# Patient Record
Sex: Male | Born: 1980 | Race: Black or African American | Hispanic: No | Marital: Married | Smoking: Never smoker
Health system: Southern US, Community
[De-identification: ages and names within clinical notes are randomized; demographics above are authoritative.]

## PROBLEM LIST (undated history)

## (undated) DIAGNOSIS — I10 Essential (primary) hypertension: Secondary | ICD-10-CM

## (undated) DIAGNOSIS — G441 Vascular headache, not elsewhere classified: Secondary | ICD-10-CM

## (undated) DIAGNOSIS — R7989 Other specified abnormal findings of blood chemistry: Secondary | ICD-10-CM

## (undated) DIAGNOSIS — I952 Hypotension due to drugs: Secondary | ICD-10-CM

## (undated) DIAGNOSIS — D649 Anemia, unspecified: Secondary | ICD-10-CM

## (undated) DIAGNOSIS — D72829 Elevated white blood cell count, unspecified: Secondary | ICD-10-CM

## (undated) DIAGNOSIS — G479 Sleep disorder, unspecified: Secondary | ICD-10-CM

## (undated) DIAGNOSIS — J9600 Acute respiratory failure, unspecified whether with hypoxia or hypercapnia: Secondary | ICD-10-CM

## (undated) DIAGNOSIS — R7401 Elevation of levels of liver transaminase levels: Secondary | ICD-10-CM

## (undated) DIAGNOSIS — D62 Acute posthemorrhagic anemia: Secondary | ICD-10-CM

## (undated) DIAGNOSIS — F1210 Cannabis abuse, uncomplicated: Secondary | ICD-10-CM

## (undated) DIAGNOSIS — E871 Hypo-osmolality and hyponatremia: Secondary | ICD-10-CM

## (undated) DIAGNOSIS — I69391 Dysphagia following cerebral infarction: Secondary | ICD-10-CM

## (undated) HISTORY — DX: Elevation of levels of liver transaminase levels: R74.01

## (undated) HISTORY — DX: Elevated white blood cell count, unspecified: D72.829

## (undated) HISTORY — DX: Cannabis abuse, uncomplicated: F12.10

## (undated) HISTORY — DX: Acute posthemorrhagic anemia: D62

## (undated) HISTORY — DX: Hypotension due to drugs: I95.2

## (undated) HISTORY — DX: Sleep disorder, unspecified: G47.9

## (undated) HISTORY — DX: Acute respiratory failure, unspecified whether with hypoxia or hypercapnia: J96.00

## (undated) HISTORY — DX: Essential (primary) hypertension: I10

## (undated) HISTORY — DX: Hypo-osmolality and hyponatremia: E87.1

## (undated) HISTORY — DX: Vascular headache, not elsewhere classified: G44.1

## (undated) HISTORY — DX: Dysphagia following cerebral infarction: I69.391

## (undated) HISTORY — DX: Other specified abnormal findings of blood chemistry: R79.89

## (undated) HISTORY — PX: WISDOM TOOTH EXTRACTION: SHX21

## (undated) HISTORY — DX: Anemia, unspecified: D64.9

---
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
## 2010-09-29 ENCOUNTER — Emergency Department (HOSPITAL_COMMUNITY): Payer: Self-pay

## 2010-09-29 ENCOUNTER — Emergency Department (HOSPITAL_COMMUNITY)
Admission: EM | Admit: 2010-09-29 | Discharge: 2010-09-29 | Disposition: A | Discharge status: Home / Self Care | Payer: Self-pay | Attending: Emergency Medicine | Admitting: Emergency Medicine

## 2010-09-29 DIAGNOSIS — F1010 Alcohol abuse, uncomplicated: Secondary | ICD-10-CM | POA: Insufficient documentation

## 2010-09-29 DIAGNOSIS — R000 Tachycardia, unspecified: Secondary | ICD-10-CM | POA: Insufficient documentation

## 2010-09-29 DIAGNOSIS — Y92009 Unspecified place in unspecified non-institutional (private) residence as the place of occurrence of the external cause: Secondary | ICD-10-CM | POA: Insufficient documentation

## 2010-09-29 DIAGNOSIS — M25519 Pain in unspecified shoulder: Secondary | ICD-10-CM | POA: Insufficient documentation

## 2010-09-29 DIAGNOSIS — S43016A Anterior dislocation of unspecified humerus, initial encounter: Secondary | ICD-10-CM | POA: Insufficient documentation

## 2017-07-24 ENCOUNTER — Ambulatory Visit: Payer: No Typology Code available for payment source | Admitting: Podiatry

## 2017-07-24 ENCOUNTER — Other Ambulatory Visit: Payer: Self-pay | Admitting: Podiatry

## 2017-07-24 ENCOUNTER — Ambulatory Visit (INDEPENDENT_AMBULATORY_CARE_PROVIDER_SITE_OTHER): Payer: No Typology Code available for payment source

## 2017-07-24 ENCOUNTER — Encounter: Payer: Self-pay | Admitting: Podiatry

## 2017-07-24 DIAGNOSIS — M25571 Pain in right ankle and joints of right foot: Secondary | ICD-10-CM

## 2017-07-24 DIAGNOSIS — M779 Enthesopathy, unspecified: Secondary | ICD-10-CM | POA: Diagnosis not present

## 2017-07-24 NOTE — Progress Notes (Signed)
Subjective:   Patient ID: Dennis Mercado, male   DOB: 37 y.o.   MRN: [id number]   HPI Patient presents with pain in the outside of the right ankle times 1 month and states that it has made it difficult for him to be active if he is real active but he is having minimal pain and also has bunion deformity with family history of this.  Patient does not smoke likes to be active   Review of Systems  All other systems reviewed and are negative.       Objective:  Physical Exam  Constitutional: He appears well-developed and well-nourished.  Cardiovascular: Intact distal pulses.  Pulmonary/Chest: Effort normal.  Musculoskeletal: Normal range of motion.  Neurological: He is alert.  Skin: Skin is warm.  Nursing note and vitals reviewed.   Neurovascular status found to be intact muscle strength is within normal limits with patient found to have discomfort in the lateral side of the right ankle that is localized and minimal in its intensity but he states he is concerned about swelling with structural bunion deformity bilateral.  Patient has good digital perfusion well oriented x3     Assessment:  Possibility for mild peroneal tendinitis right with swelling with no indication of tendon dysfunction and structural bunion deformity bilateral     Plan:  H&P x-rays reviewed and at this time I have recommended ice therapy anti-inflammatories and support and if symptoms were to persist let us know and I do not recommend structural bunion correction at the current time  X-ray indicates that there is structural bunion deformity right over left

## 2017-07-24 NOTE — Progress Notes (Signed)
   Subjective:    Patient ID: Dennis Mercado, male    DOB: [birth year], 37 y.o.   MRN: [id number]  HPI    Review of Systems  All other systems reviewed and are negative.      Objective:   Physical Exam        Assessment & Plan:

## 2018-07-09 ENCOUNTER — Telehealth: Payer: Self-pay | Admitting: Podiatry

## 2018-07-09 NOTE — Telephone Encounter (Signed)
I would like to get a disc of my x-rays. You can call me back at [phone] or my e-mail address is [email or web address]

## 2018-07-13 DIAGNOSIS — M79676 Pain in unspecified toe(s): Secondary | ICD-10-CM

## 2018-07-28 DIAGNOSIS — D492 Neoplasm of unspecified behavior of bone, soft tissue, and skin: Secondary | ICD-10-CM | POA: Diagnosis not present

## 2018-07-28 DIAGNOSIS — M19071 Primary osteoarthritis, right ankle and foot: Secondary | ICD-10-CM | POA: Diagnosis not present

## 2018-07-29 ENCOUNTER — Encounter: Payer: Self-pay | Admitting: Podiatry

## 2018-07-29 ENCOUNTER — Ambulatory Visit (INDEPENDENT_AMBULATORY_CARE_PROVIDER_SITE_OTHER): Payer: BLUE CROSS/BLUE SHIELD | Admitting: Podiatry

## 2018-07-29 ENCOUNTER — Ambulatory Visit: Payer: BLUE CROSS/BLUE SHIELD

## 2018-07-29 DIAGNOSIS — M25571 Pain in right ankle and joints of right foot: Secondary | ICD-10-CM

## 2018-07-29 DIAGNOSIS — D492 Neoplasm of unspecified behavior of bone, soft tissue, and skin: Secondary | ICD-10-CM

## 2018-07-29 NOTE — Progress Notes (Signed)
   HPI: 38 year old otherwise healthy male presents the office today for evaluation of a large mass to the lateral aspect of the right ankle.  Patient was seen by Dr. Gershon Mussel, podiatry, on 07/28/2018 and he was referred here for possible surgical consult regarding excision of the soft tissue mass.  Patient states that over the past few months the mass is grown significantly in size.  Patient states is currently nonsymptomatic however the size is increased significantly was concerning for acute changes.  History reviewed. No pertinent past medical history.   Physical Exam: General: The patient is alert and oriented x3 in no acute distress.  Dermatology: Skin is warm, dry and supple bilateral lower extremities. Negative for open lesions or macerations.  Vascular: Palpable pedal pulses bilaterally. No edema or erythema noted. Capillary refill within normal limits.  Neurological: Epicritic and protective threshold grossly intact bilaterally.   Musculoskeletal Exam: Range of motion within normal limits to all pedal and ankle joints bilateral. Muscle strength 5/5 in all groups bilateral.  There is a large fluctuant mass noted to the lateral aspect of the right ankle overlying the peroneal tendons for the most part.  Mass is large approximately 4 cm in diameter and protruding.  Appears non-adhered and very fluctuant.  Radiographic Exam taken at prior office visit:  Normal osseous mineralization. Joint spaces preserved. No fracture/dislocation/boney destruction.    Assessment: 1.  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 Patient evaluated. X-Rays reviewed that were taken at Dr. Lindley Magnus office on 07/28/2018.  2.  Today explained the patient that due to the acute changes in uncertain behavior of the soft tissue tumor surgical excision would be recommended to send to pathology.  Patient agrees.  All possible complications details the procedure were explained.  No guarantees were  expressed or implied. 3.  Authorization for surgery initiated today.  Surgery will consist of excision of soft tissue tumor right ankle. 4.  Return to clinic 1 week postop      Edrick Kins, DPM [facility]  Dr. Edrick Kins, DPM    [address], [place] [address]                Office [phone]  Fax [phone]

## 2018-07-29 NOTE — Patient Instructions (Signed)
Pre-Operative Instructions  Congratulations, you have decided to take an important step towards improving your quality of life.  You can be assured that the doctors and staff at [facility] will be with you every step of the way.  Here are some important things you should know:  1. Plan to be at the surgery center/hospital at least 1 (one) hour prior to your scheduled time, unless otherwise directed by the surgical center/hospital staff.  You must have a responsible adult accompany you, remain during the surgery and drive you home.  Make sure you have directions to the surgical center/hospital to ensure you arrive on time. 2. If you are having surgery at [facility] or [facility], you will need a copy of your medical history and physical form from your family physician within one month prior to the date of surgery. We will give you a form for your primary physician to complete.  3. We make every effort to accommodate the date you request for surgery.  However, there are times where surgery dates or times have to be moved.  We will contact you as soon as possible if a change in schedule is required.   4. No aspirin/ibuprofen for one week before surgery.  If you are on aspirin, any non-steroidal anti-inflammatory medications (Mobic, Aleve, Ibuprofen) should not be taken seven (7) days prior to your surgery.  You make take Tylenol for pain prior to surgery.  5. Medications - If you are taking daily heart and blood pressure medications, seizure, reflux, allergy, asthma, anxiety, pain or diabetes medications, make sure you notify the surgery center/hospital before the day of surgery so they can tell you which medications you should take or avoid the day of surgery. 6. No food or drink after midnight the night before surgery unless directed otherwise by surgical center/hospital staff. 7. No alcoholic beverages 24-hours prior to surgery.  No smoking 24-hours prior or 24-hours after  surgery. 8. Wear loose pants or shorts. They should be loose enough to fit over bandages, boots, and casts. 9. Don't wear slip-on shoes. Sneakers are preferred. 10. Bring your boot with you to the surgery center/hospital.  Also bring crutches or a walker if your physician has prescribed it for you.  If you do not have this equipment, it will be provided for you after surgery. 11. If you have not been contacted by the surgery center/hospital by the day before your surgery, call to confirm the date and time of your surgery. 12. Leave-time from work may vary depending on the type of surgery you have.  Appropriate arrangements should be made prior to surgery with your employer. 13. Prescriptions will be provided immediately following surgery by your doctor.  Fill these as soon as possible after surgery and take the medication as directed. Pain medications will not be refilled on weekends and must be approved by the doctor. 14. Remove nail polish on the operative foot and avoid getting pedicures prior to surgery. 15. Wash the night before surgery.  The night before surgery wash the foot and leg well with water and the antibacterial soap provided. Be sure to pay special attention to beneath the toenails and in between the toes.  Wash for at least three (3) minutes. Rinse thoroughly with water and dry well with a towel.  Perform this wash unless told not to do so by your physician.  Enclosed: 1 Ice pack (please put in freezer the night before surgery)   1 Hibiclens skin cleaner     Pre-op instructions  If you have any questions regarding the instructions, please do not hesitate to call our office.  [place]: [address], [place], [place] [address] -- [phone]  [place]: [address] [place], [place] [address] -- [phone]  [place]: [address]  [place], [place] [address] -- [phone]  [place]: [address], [address], [place],  [address] -- [phone]  Website:  [email or web address] 

## 2018-08-21 ENCOUNTER — Telehealth: Payer: Self-pay | Admitting: *Deleted

## 2018-08-21 NOTE — Telephone Encounter (Signed)
"  I am calling to verify my appointment for March 26.  Please give me a call back."  I'm returning your call.  How can I help you?  "I'm not seeing my surgery date and time in MyChart. I was told I needed to register for my surgery."  We do have you scheduled for March 26.  You are supposed to register via [facility].  The surgical center is not affiliated with [facility].  They do not have access to MyChart.  The instructions on how to register is in the brochure that we gave you.  "Oh okay, thank you."

## 2018-08-28 ENCOUNTER — Telehealth: Payer: Self-pay | Admitting: *Deleted

## 2018-08-28 NOTE — Telephone Encounter (Signed)
"  I'm scheduled for surgery on March 26.  I just need to know the time and the location."  The brochure about the surgery center is in the bag that we gave you.  "Yeah I know, I'm just not home.  I'm at work and I want to put it on my calendar."  It's [address]  Someone from the surgical center will call you a day or two prior to your surgical date and they will give you your arrival time.  "Thank you so much."

## 2018-08-31 ENCOUNTER — Encounter: Payer: Self-pay | Admitting: Family Medicine

## 2018-08-31 ENCOUNTER — Other Ambulatory Visit: Payer: Self-pay

## 2018-08-31 ENCOUNTER — Ambulatory Visit: Payer: BLUE CROSS/BLUE SHIELD | Admitting: Family Medicine

## 2018-08-31 VITALS — BP 130/80 | HR 100 | Temp 98.9°F | Ht 67.0 in | Wt 168.5 lb

## 2018-08-31 DIAGNOSIS — Z0000 Encounter for general adult medical examination without abnormal findings: Secondary | ICD-10-CM

## 2018-08-31 NOTE — Patient Instructions (Signed)
Preventive Care 18-39 Years, Male Preventive care refers to lifestyle choices and visits with your health care provider that can promote health and wellness. What does preventive care include?   A yearly physical exam. This is also called an annual well check.  Dental exams once or twice a year.  Routine eye exams. Ask your health care provider how often you should have your eyes checked.  Personal lifestyle choices, including: ? Daily care of your teeth and gums. ? Regular physical activity. ? Eating a healthy diet. ? Avoiding tobacco and drug use. ? Limiting alcohol use. ? Practicing safe sex. What happens during an annual well check? The services and screenings done by your health care provider during your annual well check will depend on your age, overall health, lifestyle risk factors, and family history of disease. Counseling Your health care provider may ask you questions about your:  Alcohol use.  Tobacco use.  Drug use.  Emotional well-being.  Home and relationship well-being.  Sexual activity.  Eating habits.  Work and work Statistician. Screening You may have the following tests or measurements:  Height, weight, and BMI.  Blood pressure.  Lipid and cholesterol levels. These may be checked every 5 years starting at age 61.  Diabetes screening. This is done by checking your blood sugar (glucose) after you have not eaten for a while (fasting).  Skin check.  Hepatitis C blood test.  Hepatitis B blood test.  Sexually transmitted disease (STD) testing. Discuss your test results, treatment options, and if necessary, the need for more tests with your health care provider. Vaccines Your health care provider may recommend certain vaccines, such as:  Influenza vaccine. This is recommended every year.  Tetanus, diphtheria, and acellular pertussis (Tdap, Td) vaccine. You may need a Td booster every 10 years.  Varicella vaccine. You may need this if you  have not been vaccinated.  HPV vaccine. If you are 71 or younger, you may need three doses over 6 months.  Measles, mumps, and rubella (MMR) vaccine. You may need at least one dose of MMR.You may also need a second dose.  Pneumococcal 13-valent conjugate (PCV13) vaccine. You may need this if you have certain conditions and have not been vaccinated.  Pneumococcal polysaccharide (PPSV23) vaccine. You may need one or two doses if you smoke cigarettes or if you have certain conditions.  Meningococcal vaccine. One dose is recommended if you are age 33-21 years and a first-year college student living in a residence hall, or if you have one of several medical conditions. You may also need additional booster doses.  Hepatitis A vaccine. You may need this if you have certain conditions or if you travel or work in places where you may be exposed to hepatitis A.  Hepatitis B vaccine. You may need this if you have certain conditions or if you travel or work in places where you may be exposed to hepatitis B.  Haemophilus influenzae type b (Hib) vaccine. You may need this if you have certain risk factors. Talk to your health care provider about which screenings and vaccines you need and how often you need them. This information is not intended to replace advice given to you by your health care provider. Make sure you discuss any questions you have with your health care provider. Document Released: 07/30/2001 Document Revised: 01/14/2017 Document Reviewed: 04/04/2015 Elsevier Interactive Patient Education  2019 [place] Maintenance, Male A healthy lifestyle and preventive care is important for your health and wellness.  Ask your health care provider about what schedule of regular examinations is right for you. What should I know about weight and diet? Eat a Healthy Diet  Eat plenty of vegetables, fruits, whole grains, low-fat dairy products, and lean protein.  Do not eat a lot of foods  high in solid fats, added sugars, or salt.  Maintain a Healthy Weight Regular exercise can help you achieve or maintain a healthy weight. You should:  Do at least 150 minutes of exercise each week. The exercise should increase your heart rate and make you sweat (moderate-intensity exercise).  Do strength-training exercises at least twice a week. Watch Your Levels of Cholesterol and Blood Lipids  Have your blood tested for lipids and cholesterol every 5 years starting at 38 years of age. If you are at high risk for heart disease, you should start having your blood tested when you are 38 years old. You may need to have your cholesterol levels checked more often if: ? Your lipid or cholesterol levels are high. ? You are older than 38 years of age. ? You are at high risk for heart disease. What should I know about cancer screening? Many types of cancers can be detected early and may often be prevented. Lung Cancer  You should be screened every year for lung cancer if: ? You are a current smoker who has smoked for at least 30 years. ? You are a former smoker who has quit within the past 15 years.  Talk to your health care provider about your screening options, when you should start screening, and how often you should be screened. Colorectal Cancer  Routine colorectal cancer screening usually begins at 38 years of age and should be repeated every 5-10 years until you are 38 years old. You may need to be screened more often if early forms of precancerous polyps or small growths are found. Your health care provider may recommend screening at an earlier age if you have risk factors for colon cancer.  Your health care provider may recommend using home test kits to check for hidden blood in the stool.  A small camera at the end of a tube can be used to examine your colon (sigmoidoscopy or colonoscopy). This checks for the earliest forms of colorectal cancer. Prostate and Testicular Cancer   Depending on your age and overall health, your health care provider may do certain tests to screen for prostate and testicular cancer.  Talk to your health care provider about any symptoms or concerns you have about testicular or prostate cancer. Skin Cancer  Check your skin from head to toe regularly.  Tell your health care provider about any new moles or changes in moles, especially if: ? There is a change in a mole's size, shape, or color. ? You have a mole that is larger than a pencil eraser.  Always use sunscreen. Apply sunscreen liberally and repeat throughout the day.  Protect yourself by wearing long sleeves, pants, a wide-brimmed hat, and sunglasses when outside. What should I know about heart disease, diabetes, and high blood pressure?  If you are 50-7 years of age, have your blood pressure checked every 3-5 years. If you are 19 years of age or older, have your blood pressure checked every year. You should have your blood pressure measured twice-once when you are at a hospital or clinic, and once when you are not at a hospital or clinic. Record the average of the two measurements. To check your blood pressure when  you are not at a hospital or clinic, you can use: ? An automated blood pressure machine at a pharmacy. ? A home blood pressure monitor.  Talk to your health care provider about your target blood pressure.  If you are between 22-25 years old, ask your health care provider if you should take aspirin to prevent heart disease.  Have regular diabetes screenings by checking your fasting blood sugar level. ? If you are at a normal weight and have a low risk for diabetes, have this test once every three years after the age of 68. ? If you are overweight and have a high risk for diabetes, consider being tested at a younger age or more often.  A one-time screening for abdominal aortic aneurysm (AAA) by ultrasound is recommended for men aged 79-75 years who are current or former  smokers. What should I know about preventing infection? Hepatitis B If you have a higher risk for hepatitis B, you should be screened for this virus. Talk with your health care provider to find out if you are at risk for hepatitis B infection. Hepatitis C Blood testing is recommended for:  Everyone born from [birth year] through 1965.  Anyone with known risk factors for hepatitis C. Sexually Transmitted Diseases (STDs)  You should be screened each year for STDs including gonorrhea and chlamydia if: ? You are sexually active and are younger than 38 years of age. ? You are older than 38 years of age and your health care provider tells you that you are at risk for this type of infection. ? Your sexual activity has changed since you were last screened and you are at an increased risk for chlamydia or gonorrhea. Ask your health care provider if you are at risk.  Talk with your health care provider about whether you are at high risk of being infected with HIV. Your health care provider may recommend a prescription medicine to help prevent HIV infection. What else can I do?  Schedule regular health, dental, and eye exams.  Stay current with your vaccines (immunizations).  Do not use any tobacco products, such as cigarettes, chewing tobacco, and e-cigarettes. If you need help quitting, ask your health care provider.  Limit alcohol intake to no more than 2 drinks per day. One drink equals 12 ounces of beer, 5 ounces of wine, or 1 ounces of hard liquor.  Do not use street drugs.  Do not share needles.  Ask your health care provider for help if you need support or information about quitting drugs.  Tell your health care provider if you often feel depressed.  Tell your health care provider if you have ever been abused or do not feel safe at home. This information is not intended to replace advice given to you by your health care provider. Make sure you discuss any questions you have with your  health care provider. Document Released: 11/30/2007 Document Revised: 01/31/2016 Document Reviewed: 03/07/2015 Elsevier Interactive Patient Education  2019 [employer].  Alcohol Use Disorder Alcohol use disorder is when your drinking disrupts your daily life. When you have this condition, you drink too much alcohol and you cannot control your drinking. Alcohol use disorder can cause serious problems with your physical health. It can affect your brain, heart, liver, pancreas, immune system, stomach, and intestines. Alcohol use disorder can increase your risk for certain cancers and cause problems with your mental health, such as depression, anxiety, psychosis, delirium, and dementia. People with this disorder risk hurting themselves and  others. What are the causes? This condition is caused by drinking too much alcohol over time. It is not caused by drinking too much alcohol only one or two times. Some people with this condition drink alcohol to cope with or escape from negative life events. Others drink to relieve pain or symptoms of mental illness. What increases the risk? You are more likely to develop this condition if:  You have a family history of alcohol use disorder.  Your culture encourages drinking to the point of intoxication, or makes alcohol easy to get.  You had a mood or conduct disorder in childhood.  You have been a victim of abuse.  You are an adolescent and: ? You have poor grades or difficulties in school. ? Your caregivers do not talk to you about saying no to alcohol, or supervise your activities. ? You are impulsive or you have trouble with self-control. What are the signs or symptoms? Symptoms of this condition include:  Drinkingmore than you want to.  Drinking for longer than you want to.  Trying several times to drink less or to control your drinking.  Spending a lot of time getting alcohol, drinking, or recovering from drinking.  Craving alcohol.   Having problems at work, at school, or at home due to drinking.  Having problems in relationships due to drinking.  Drinking when it is dangerous to drink, such as before driving a car.  Continuing to drink even though you know you might have a physical or mental problem related to drinking.  Needing more and more alcohol to get the same effect you want from the alcohol (building up tolerance).  Having symptoms of withdrawal when you stop drinking. Symptoms of withdrawal include: ? Fatigue. ? Nightmares. ? Trouble sleeping. ? Depression. ? Anxiety. ? Fever. ? Seizures. ? Severe confusion. ? Feeling or seeing things that are not there (hallucinations). ? Tremors. ? Rapid heart rate. ? Rapid breathing. ? High blood pressure.  Drinking to avoid symptoms of withdrawal. How is this diagnosed? This condition is diagnosed with an assessment. Your health care provider may start the assessment by asking three or four questions about your drinking. Your health care provider may perform a physical exam or do lab tests to see if you have physical problems resulting from alcohol use. She or he may refer you to a mental health professional for evaluation. How is this treated? Some people with alcohol use disorder are able to reduce their alcohol use to low-risk levels. Others need to completely quit drinking alcohol. When necessary, mental health professionals with specialized training in substance use treatment can help. Your health care provider can help you decide how severe your alcohol use disorder is and what type of treatment you need. The following forms of treatment are available:  Detoxification. Detoxification involves quitting drinking and using prescription medicines within the first week to help lessen withdrawal symptoms. This treatment is important for people who have had withdrawal symptoms before and for heavy drinkers who are likely to have withdrawal symptoms. Alcohol withdrawal  can be dangerous, and in severe cases, it can cause death. Detoxification may be provided in a home, community, or primary care setting, or in a hospital or substance use treatment facility.  Counseling. This treatment is also called talk therapy. It is provided by substance use treatment counselors. A counselor can address the reasons you use alcohol and suggest ways to keep you from drinking again or to prevent problem drinking. The goals of talk therapy  are to: ? Find healthy activities and ways for you to cope with stress. ? Identify and avoid the things that trigger your alcohol use. ? Help you learn how to handle cravings.  Medicines.Medicines can help treat alcohol use disorder by: ? Decreasing alcohol cravings. ? Decreasing the positive feeling you have when you drink alcohol. ? Causing an uncomfortable physical reaction when you drink alcohol (aversion therapy).  Support groups. Support groups are led by people who have quit drinking. They provide emotional support, advice, and guidance. These forms of treatment are often combined. Some people with this condition benefit from a combination of treatments provided by specialized substance use treatment centers. Follow these instructions at home:  Take over-the-counter and prescription medicines only as told by your health care provider.  Check with your health care provider before starting any new medicines.  Ask friends and family members not to offer you alcohol.  Avoid situations where alcohol is served, including gatherings where others are drinking alcohol.  Create a plan for what to do when you are tempted to use alcohol.  Find hobbies or activities that you enjoy that do not include alcohol.  Keep all follow-up visits as told by your health care provider. This is important. How is this prevented?  If you drink, limit alcohol intake to no more than 1 drink a day for nonpregnant women and 2 drinks a day for men. One drink  equals 12 oz of beer, 5 oz of wine, or 1 oz of hard liquor.  If you have a mental health condition, get treatment and support.  Do not give alcohol to adolescents.  If you are an adolescent: ? Do not drink alcohol. ? Do not be afraid to say no if someone offers you alcohol. Speak up about why you do not want to drink. You can be a positive role model for your friends and set a good example for those around you by not drinking alcohol. ? If your friends drink, spend time with others who do not drink alcohol. Make new friends who do not use alcohol. ? Find healthy ways to manage stress and emotions, such as meditation or deep breathing, exercise, spending time in nature, listening to music, or talking with a trusted friend or family member. Contact a health care provider if:  You are not able to take your medicines as told.  Your symptoms get worse.  You return to drinking alcohol (relapse) and your symptoms get worse. Get help right away if:  You have thoughts about hurting yourself or others. If you ever feel like you may hurt yourself or others, or have thoughts about taking your own life, get help right away. You can go to your nearest emergency department or call:  Your local emergency services (911 in the [place]  A suicide crisis helpline, such as the [place] at (502)834-0495. This is open 24 hours a day. Summary  Alcohol use disorder is when your drinking disrupts your daily life. When you have this condition, you drink too much alcohol and you cannot control your drinking.  Treatment may include detoxification, counseling, medicine, and support groups.  Ask friends and family members not to offer you alcohol. Avoid situations where alcohol is served.  Get help right away if you have thoughts about hurting yourself or others. This information is not intended to replace advice given to you by your health care provider. Make sure you discuss any  questions you have with your health care  provider. Document Released: 07/11/2004 Document Revised: 02/29/2016 Document Reviewed: 02/29/2016 Elsevier Interactive Patient Education  [employer].

## 2018-08-31 NOTE — Progress Notes (Signed)
Established Patient Office Visit  Subjective:  Patient ID: Dennis Mercado, male    DOB: 07-Dec-1980  Age: 38 y.o. MRN: [id number]  CC:  Chief Complaint  Patient presents with  . Establish Care    HPI Dennis Mercado presents for establishment of care and a physical exam.  Patient is in good health as far as he knows.  His blood pressure was elevated at the dentist when he was about to have a tooth pulled.  He has no known history of blood pressure issue.  Denies chronic headaches or changes in his vision.  Mom is in early 41s and is overweight.  He thinks that she may have high blood pressure.  Father's health history is unknown.  Patient works in the [employer], children[employer].  He is engaged to be married in May.  He is currently living with his fiance.  Patient does not smoke or use illicit drugs.  He does not drink alcohol through the week but consumes up to 1/5 of alcohol over the weekend with his fiance.  He is has been concerned about her drinking.  He does get 20 minutes of exercise walking his dog daily. History reviewed. No pertinent past medical history.  History reviewed. No pertinent surgical history.  History reviewed. No pertinent family history.  Social History   Socioeconomic History  . Marital status: Single    Spouse name: Not on file  . Number of children: Not on file  . Years of education: Not on file  . Highest education level: Not on file  Occupational History  . Not on file  Social Needs  . Financial resource strain: Not on file  . Food insecurity:    Worry: Not on file    Inability: Not on file  . Transportation needs:    Medical: Not on file    Non-medical: Not on file  Tobacco Use  . Smoking status: Never Smoker  . Smokeless tobacco: Never Used  Substance and Sexual Activity  . Alcohol use: Yes    Comment: several drinks over the weekend  . Drug use: Never  . Sexual activity: Not on file  Lifestyle  . Physical activity:    Days per  week: Not on file    Minutes per session: Not on file  . Stress: Not on file  Relationships  . Social connections:    Talks on phone: Not on file    Gets together: Not on file    Attends religious service: Not on file    Active member of club or organization: Not on file    Attends meetings of clubs or organizations: Not on file    Relationship status: Not on file  . Intimate partner violence:    Fear of current or ex partner: Not on file    Emotionally abused: Not on file    Physically abused: Not on file    Forced sexual activity: Not on file  Other Topics Concern  . Not on file  Social History Narrative  . Not on file    No outpatient medications prior to visit.   No facility-administered medications prior to visit.     No Known Allergies  ROS Review of Systems  Constitutional: Negative for chills, diaphoresis, fatigue, fever and unexpected weight change.  HENT: Negative.   Eyes: Negative for photophobia and visual disturbance.  Respiratory: Negative.   Cardiovascular: Negative.   Gastrointestinal: Negative.   Genitourinary: Negative.   Musculoskeletal: Negative for gait problem.  Skin: Negative for pallor and rash.  Allergic/Immunologic: Negative for immunocompromised state.  Neurological: Negative for headaches.  Hematological: Does not bruise/bleed easily.  Psychiatric/Behavioral: Negative.       Objective:    Physical Exam  Constitutional: He is oriented to person, place, and time. He appears well-developed and well-nourished. No distress.  HENT:  Head: Normocephalic and atraumatic.  Right Ear: External ear normal.  Left Ear: External ear normal.  Mouth/Throat: Oropharynx is clear and moist. No oropharyngeal exudate.  Eyes: Pupils are equal, round, and reactive to light. Conjunctivae are normal. Right eye exhibits no discharge. Left eye exhibits no discharge. No scleral icterus.  Neck: Neck supple. No JVD present. No tracheal deviation present. No  thyromegaly present.  Cardiovascular: Normal rate, regular rhythm and normal heart sounds.  Pulmonary/Chest: Effort normal and breath sounds normal. No stridor.  Abdominal: Bowel sounds are normal. He exhibits no distension. There is no abdominal tenderness. There is no rebound and no guarding. Hernia confirmed negative in the right inguinal area and confirmed negative in the left inguinal area.  Genitourinary:    Penis normal.  Right testis shows no mass, no swelling and no tenderness. Right testis is descended. Left testis shows no mass, no swelling and no tenderness. Left testis is descended. Uncircumcised. No phimosis, paraphimosis, hypospadias, penile erythema or penile tenderness. No discharge found.  Lymphadenopathy:    He has no cervical adenopathy.       Right: No inguinal adenopathy present.       Left: No inguinal adenopathy present.  Neurological: He is alert and oriented to person, place, and time.  Skin: Skin is warm and dry. He is not diaphoretic.  Psychiatric: He has a normal mood and affect. His behavior is normal.    BP 130/80   Pulse 100   Temp 98.9 F (37.2 C) (Oral)   Ht _0  (1.702 m)   Wt 168 lb 8 oz (76.4 kg)   SpO2 98%   BMI 26.39 kg/m  Wt Readings from Last 3 Encounters:  08/31/18 168 lb 8 oz (76.4 kg)   BP Readings from Last 3 Encounters:  08/31/18 130/80   Guideline developer:  UpToDate (see UpToDate for funding source) Date Released: June 2014  Health Maintenance Due  Topic Date Due  . HIV Screening  01/28/1996  . TETANUS/TDAP  01/28/2000  . INFLUENZA VACCINE  01/15/2018    There are no preventive care reminders to display for this patient.  No results found for: TSH No results found for: WBC, HGB, HCT, MCV, PLT No results found for: NA, K, CHLORIDE, CO2, GLUCOSE, BUN, CREATININE, BILITOT, ALKPHOS, AST, ALT, PROT, ALBUMIN, CALCIUM, ANIONGAP, EGFR, GFR No results found for: CHOL No results found for: HDL No results found for: LDLCALC No  results found for: TRIG No results found for: CHOLHDL No results found for: HGBA1C    Assessment & Plan:   Problem List Items Addressed This Visit    None    Visit Diagnoses    Healthcare maintenance    -  Primary   Relevant Orders   CBC   Comprehensive metabolic panel   HIV Antibody (routine testing w rflx)   Lipid panel   Urinalysis, Routine w reflex microscopic      No orders of the defined types were placed in this encounter.   Follow-up: Return in about 6 months (around 03/03/2019).   Patient was given information on health maintenance and disease prevention.  He was also given information on alcohol  use disorder.  Encouraged him to exercise at least 30 minutes 5 days a week.  Discussed the relationship between elevated blood pressure and alcohol.  We will return fasting for above ordered

## 2018-09-08 ENCOUNTER — Telehealth: Payer: Self-pay | Admitting: *Deleted

## 2018-09-08 NOTE — Telephone Encounter (Signed)
"  I was calling to confirm my appointment for Thursday.  If someone would, get with me within a day or two of surgery.  Call me back on my cell phone.  Talk to you soon."  I left the patient a message to call me back.  I informed him that we need to reschedule his surgery.  Surgeries have been canceled due to the [facility] Virus.

## 2018-09-09 NOTE — Telephone Encounter (Signed)
I'm calling to reschedule your surgery.  "Okay, when can he do it."  He can do it on April 16.  "That's fine, what time?"  Someone from the surgical center will call you a day or two prior to your surgery date.  They will give you your arrival time.

## 2018-09-16 ENCOUNTER — Other Ambulatory Visit: Payer: BLUE CROSS/BLUE SHIELD

## 2018-09-18 ENCOUNTER — Telehealth: Payer: Self-pay | Admitting: *Deleted

## 2018-09-18 NOTE — Telephone Encounter (Signed)
Unfortunately, I am calling to reschedule your surgery again.  Dr. Amalia Hailey can do it on Nov 12, 2018.  "Is this a guarantee?" I cannot guarantee that it will be done at this time.  It depends on whether or not if the [facility] Virus is still here.  "Okay, put me down."  I rescheduled his surgery from 10/01/2018 to 11/12/2018 via the surgical center's [employer] Portal.

## 2018-10-06 ENCOUNTER — Telehealth: Payer: Self-pay | Admitting: *Deleted

## 2018-10-06 NOTE — Telephone Encounter (Signed)
"  Next Thursday sounds good."  I'll get your surgery rescheduled.  "You don't know what time do you?"  I do not know.  Someone from the surgical center will give you a call a day or two prior to your surgery date.  They will give you your arrival time.  I rescheduled his surgery from 11/12/2018 to 10/15/2018 via the surgical center's [employer] Portal.

## 2018-10-06 NOTE — Telephone Encounter (Signed)
"  I am calling to let you know that the surgical center has reopened.  Dr. Amalia Hailey can do your surgery this Thursday or he can do it next Thursday.  "Sweet, can I call you back?"  I need to find a ride?"  Yes, that will be fine.  "I'll call you right back."

## 2018-10-07 ENCOUNTER — Other Ambulatory Visit: Payer: BLUE CROSS/BLUE SHIELD

## 2018-10-07 ENCOUNTER — Telehealth: Payer: Self-pay | Admitting: *Deleted

## 2018-10-07 NOTE — Telephone Encounter (Signed)
DOS 10/15/2018 CPT CODE 06004 RIGHT FOOT  PRE-CERT NOT REQUIRED  Member Number: [id number] Policy Effective : 02/33/4356 - 06/16/9998   Member Liability Summary      In-Network    Max Per Benefit Period Year-to-Date Remaining  CoInsurance  50%   Deductible  $5000.00 $8616.83  Out-Of-Pocket  $6600.00 $6131.89   AMBULATORY SURGERY  In Network  [employer] Coinsurance  Not Applicable  72% per [place]

## 2018-10-15 ENCOUNTER — Encounter: Payer: Self-pay | Admitting: *Deleted

## 2018-10-15 ENCOUNTER — Encounter: Payer: Self-pay | Admitting: Podiatry

## 2018-10-15 ENCOUNTER — Other Ambulatory Visit: Payer: Self-pay | Admitting: Podiatry

## 2018-10-15 DIAGNOSIS — I10 Essential (primary) hypertension: Secondary | ICD-10-CM | POA: Diagnosis not present

## 2018-10-15 DIAGNOSIS — M67479 Ganglion, unspecified ankle and foot: Secondary | ICD-10-CM | POA: Diagnosis not present

## 2018-10-15 DIAGNOSIS — M67471 Ganglion, right ankle and foot: Secondary | ICD-10-CM | POA: Diagnosis not present

## 2018-10-15 DIAGNOSIS — D492 Neoplasm of unspecified behavior of bone, soft tissue, and skin: Secondary | ICD-10-CM | POA: Diagnosis not present

## 2018-10-15 MED ORDER — OXYCODONE-ACETAMINOPHEN 5-325 MG PO TABS: 1.0000 | ORAL_TABLET | Freq: Four times a day (QID) | ORAL | 0 refills | Status: DC | PRN

## 2018-10-15 NOTE — Progress Notes (Signed)
.  postop

## 2018-10-15 NOTE — Telephone Encounter (Addendum)
Pt called and states he had surgery today at the surgery center with Dr. Amalia Hailey, and Dr. Amalia Hailey did not give him a note for work. I told pt I would write a note for pt to be out of work until evaluated at the 1st POV on 10/21/2018. Pt requested the letter to be emailed to [email or web address]. Emailed letter of 10/15/2018 to pt requested address.

## 2018-10-17 ENCOUNTER — Encounter: Payer: Self-pay | Admitting: Podiatry

## 2018-10-21 ENCOUNTER — Encounter: Payer: Self-pay | Admitting: Podiatry

## 2018-10-21 ENCOUNTER — Ambulatory Visit (INDEPENDENT_AMBULATORY_CARE_PROVIDER_SITE_OTHER): Payer: Self-pay | Admitting: Podiatry

## 2018-10-21 ENCOUNTER — Other Ambulatory Visit: Payer: Self-pay

## 2018-10-21 VITALS — Temp 97.7°F

## 2018-10-21 DIAGNOSIS — Z09 Encounter for follow-up examination after completed treatment for conditions other than malignant neoplasm: Secondary | ICD-10-CM

## 2018-10-21 DIAGNOSIS — D492 Neoplasm of unspecified behavior of bone, soft tissue, and skin: Secondary | ICD-10-CM

## 2018-10-21 NOTE — Progress Notes (Signed)
   Subjective:  Patient presents today status post excision of ganglion right lateral ankle. DOS: 10/15/2018.  Patient denies pain.  He has been doing very well in his cam boot.  He is kept the dressings clean dry and intact.  History reviewed. No pertinent past medical history.    Objective/Physical Exam Neurovascular status intact.  Skin incisions appear to be well coapted with staples intact. No sign of infectious process noted. No dehiscence. No active bleeding noted. Moderate edema noted to the surgical extremity.  There does appear to be some fluctuance along the incision site.  Hopefully this resolves with time.  Assessment: 1. s/p excision ganglion cyst right ankle. DOS: 10/15/2018   Plan of Care:  1. Patient was evaluated.  2.  Dressings changed today.  Keep dressings clean dry and intact x1 week 3.  Continue weightbearing in the cam boot x1 week 4.  Return to clinic in 1 week for staple removal   Edrick Kins, DPM [facility]  Dr. Edrick Kins, [address]                                        [place], [place] [address]                Office [phone]  Fax [phone]

## 2018-10-22 ENCOUNTER — Encounter: Payer: Self-pay | Admitting: Podiatry

## 2018-10-28 ENCOUNTER — Other Ambulatory Visit: Payer: Self-pay

## 2018-10-28 ENCOUNTER — Encounter: Payer: Self-pay | Admitting: Podiatry

## 2018-10-28 ENCOUNTER — Ambulatory Visit (INDEPENDENT_AMBULATORY_CARE_PROVIDER_SITE_OTHER): Payer: BLUE CROSS/BLUE SHIELD | Admitting: Podiatry

## 2018-10-28 DIAGNOSIS — Z09 Encounter for follow-up examination after completed treatment for conditions other than malignant neoplasm: Secondary | ICD-10-CM

## 2018-10-28 DIAGNOSIS — D492 Neoplasm of unspecified behavior of bone, soft tissue, and skin: Secondary | ICD-10-CM

## 2018-11-01 NOTE — Progress Notes (Signed)
   Subjective:  Patient presents today status post excision of ganglion right lateral ankle. DOS: 10/15/2018. He states he has experienced an increase in pain since his previous visit. He has been using the CAM boot as directed and denies modifying factors. Patient is here for further evaluation and treatment.   History reviewed. No pertinent past medical history.    Objective/Physical Exam Neurovascular status intact.  Skin incisions appear to be well coapted with staples intact. No sign of infectious process noted. No dehiscence. No active bleeding noted. Moderate edema noted to the surgical extremity.  There does appear to be some fluctuance along the incision site.  Hopefully this resolves with time.  Assessment: 1. s/p excision ganglion cyst right ankle. DOS: 10/15/2018   Plan of Care:  1. Patient was evaluated.  2. Staples removed.  3. Compression anklet dispensed.  4. Discontinue using CAM boot.  5. Return to clinic in 3 weeks for final check up.    Edrick Kins, DPM [facility]  Dr. Edrick Kins, [address]                                        [place], [place] [address]                Office [phone]  Fax [phone]

## 2018-11-18 ENCOUNTER — Ambulatory Visit (INDEPENDENT_AMBULATORY_CARE_PROVIDER_SITE_OTHER): Payer: BC Managed Care – PPO | Admitting: Podiatry

## 2018-11-18 ENCOUNTER — Other Ambulatory Visit: Payer: Self-pay

## 2018-11-18 ENCOUNTER — Other Ambulatory Visit: Payer: BLUE CROSS/BLUE SHIELD

## 2018-11-18 ENCOUNTER — Encounter: Payer: Self-pay | Admitting: Podiatry

## 2018-11-18 DIAGNOSIS — Z09 Encounter for follow-up examination after completed treatment for conditions other than malignant neoplasm: Secondary | ICD-10-CM

## 2018-11-18 DIAGNOSIS — D492 Neoplasm of unspecified behavior of bone, soft tissue, and skin: Secondary | ICD-10-CM

## 2018-11-21 NOTE — Progress Notes (Signed)
   Subjective:  Patient presents today status post excision of ganglion right lateral ankle. DOS: 10/15/2018. He states he is doing very well. He has been wearing regular sneakers. He denies any pain or modifying factors. Patient is here for further evaluation and treatment. Patient is here for further evaluation and treatment.   History reviewed. No pertinent past medical history.   Objective: Physical Exam General: The patient is alert and oriented x3 in no acute distress.  Dermatology: Skin is cool, dry and supple bilateral lower extremities. Negative for open lesions or macerations.  Vascular: Palpable pedal pulses bilaterally. No edema or erythema noted. Capillary refill within normal limits.  Neurological: Epicritic and protective threshold grossly intact bilaterally.   Musculoskeletal Exam: All pedal and ankle joints range of motion within normal limits bilateral. Muscle strength 5/5 in all groups bilateral.    Assessment: 1. s/p excision ganglion cyst right ankle. DOS: 10/15/2018   Plan of Care:  1. Patient was evaluated.  2. May resume full activity with no restrictions.  3. Recommended good shoe gear.  4. Return to clinic as needed.   Works at [employer].    Edrick Kins, DPM [facility]  Dr. Edrick Kins, [address]                                        [place], [place] [address]                Office [phone]  Fax [phone]

## 2019-02-16 ENCOUNTER — Other Ambulatory Visit: Payer: Self-pay | Admitting: Emergency Medicine

## 2019-02-16 DIAGNOSIS — Z20822 Contact with and (suspected) exposure to covid-19: Secondary | ICD-10-CM

## 2019-02-18 LAB — NOVEL CORONAVIRUS, NAA: SARS-CoV-2, NAA: NOT DETECTED

## 2019-10-28 DIAGNOSIS — Z20822 Contact with and (suspected) exposure to covid-19: Secondary | ICD-10-CM | POA: Diagnosis not present

## 2019-12-06 ENCOUNTER — Inpatient Hospital Stay (HOSPITAL_COMMUNITY): Payer: BC Managed Care – PPO

## 2019-12-06 ENCOUNTER — Emergency Department (HOSPITAL_COMMUNITY): Payer: BC Managed Care – PPO

## 2019-12-06 ENCOUNTER — Inpatient Hospital Stay (HOSPITAL_COMMUNITY)
Admission: EM | Admit: 2019-12-06 | Discharge: 2019-12-15 | DRG: 981 | Disposition: A | Discharge status: Rehabilitation Facility | Payer: BC Managed Care – PPO | Attending: Internal Medicine | Admitting: Internal Medicine

## 2019-12-06 DIAGNOSIS — I69114 Frontal lobe and executive function deficit following nontraumatic intracerebral hemorrhage: Secondary | ICD-10-CM | POA: Diagnosis not present

## 2019-12-06 DIAGNOSIS — R52 Pain, unspecified: Secondary | ICD-10-CM | POA: Diagnosis not present

## 2019-12-06 DIAGNOSIS — R569 Unspecified convulsions: Secondary | ICD-10-CM | POA: Diagnosis not present

## 2019-12-06 DIAGNOSIS — I610 Nontraumatic intracerebral hemorrhage in hemisphere, subcortical: Secondary | ICD-10-CM | POA: Diagnosis present

## 2019-12-06 DIAGNOSIS — I69154 Hemiplegia and hemiparesis following nontraumatic intracerebral hemorrhage affecting left non-dominant side: Secondary | ICD-10-CM | POA: Diagnosis not present

## 2019-12-06 DIAGNOSIS — R519 Headache, unspecified: Secondary | ICD-10-CM | POA: Diagnosis not present

## 2019-12-06 DIAGNOSIS — I69112 Visuospatial deficit and spatial neglect following nontraumatic intracerebral hemorrhage: Secondary | ICD-10-CM | POA: Diagnosis not present

## 2019-12-06 DIAGNOSIS — J90 Pleural effusion, not elsewhere classified: Secondary | ICD-10-CM | POA: Diagnosis not present

## 2019-12-06 DIAGNOSIS — I160 Hypertensive urgency: Secondary | ICD-10-CM | POA: Diagnosis present

## 2019-12-06 DIAGNOSIS — G936 Cerebral edema: Secondary | ICD-10-CM | POA: Diagnosis not present

## 2019-12-06 DIAGNOSIS — I10 Essential (primary) hypertension: Secondary | ICD-10-CM | POA: Diagnosis not present

## 2019-12-06 DIAGNOSIS — Z20822 Contact with and (suspected) exposure to covid-19: Secondary | ICD-10-CM | POA: Diagnosis not present

## 2019-12-06 DIAGNOSIS — I69391 Dysphagia following cerebral infarction: Secondary | ICD-10-CM | POA: Diagnosis not present

## 2019-12-06 DIAGNOSIS — I615 Nontraumatic intracerebral hemorrhage, intraventricular: Secondary | ICD-10-CM | POA: Diagnosis not present

## 2019-12-06 DIAGNOSIS — Z4682 Encounter for fitting and adjustment of non-vascular catheter: Secondary | ICD-10-CM | POA: Diagnosis not present

## 2019-12-06 DIAGNOSIS — R1310 Dysphagia, unspecified: Secondary | ICD-10-CM | POA: Diagnosis not present

## 2019-12-06 DIAGNOSIS — D62 Acute posthemorrhagic anemia: Secondary | ICD-10-CM | POA: Diagnosis not present

## 2019-12-06 DIAGNOSIS — R29810 Facial weakness: Secondary | ICD-10-CM | POA: Diagnosis present

## 2019-12-06 DIAGNOSIS — N179 Acute kidney failure, unspecified: Secondary | ICD-10-CM | POA: Diagnosis not present

## 2019-12-06 DIAGNOSIS — I618 Other nontraumatic intracerebral hemorrhage: Secondary | ICD-10-CM | POA: Diagnosis present

## 2019-12-06 DIAGNOSIS — I629 Nontraumatic intracranial hemorrhage, unspecified: Secondary | ICD-10-CM | POA: Diagnosis not present

## 2019-12-06 DIAGNOSIS — E785 Hyperlipidemia, unspecified: Secondary | ICD-10-CM | POA: Diagnosis present

## 2019-12-06 DIAGNOSIS — J9601 Acute respiratory failure with hypoxia: Secondary | ICD-10-CM | POA: Diagnosis not present

## 2019-12-06 DIAGNOSIS — I619 Nontraumatic intracerebral hemorrhage, unspecified: Secondary | ICD-10-CM

## 2019-12-06 DIAGNOSIS — R7989 Other specified abnormal findings of blood chemistry: Secondary | ICD-10-CM | POA: Diagnosis not present

## 2019-12-06 DIAGNOSIS — J9811 Atelectasis: Secondary | ICD-10-CM | POA: Diagnosis not present

## 2019-12-06 DIAGNOSIS — F1210 Cannabis abuse, uncomplicated: Secondary | ICD-10-CM | POA: Diagnosis present

## 2019-12-06 DIAGNOSIS — I69191 Dysphagia following nontraumatic intracerebral hemorrhage: Secondary | ICD-10-CM | POA: Diagnosis not present

## 2019-12-06 DIAGNOSIS — Z4659 Encounter for fitting and adjustment of other gastrointestinal appliance and device: Secondary | ICD-10-CM | POA: Diagnosis not present

## 2019-12-06 DIAGNOSIS — R4781 Slurred speech: Secondary | ICD-10-CM | POA: Diagnosis not present

## 2019-12-06 DIAGNOSIS — D72829 Elevated white blood cell count, unspecified: Secondary | ICD-10-CM | POA: Diagnosis not present

## 2019-12-06 DIAGNOSIS — J690 Pneumonitis due to inhalation of food and vomit: Secondary | ICD-10-CM | POA: Diagnosis not present

## 2019-12-06 DIAGNOSIS — E870 Hyperosmolality and hypernatremia: Secondary | ICD-10-CM | POA: Diagnosis not present

## 2019-12-06 DIAGNOSIS — G4489 Other headache syndrome: Secondary | ICD-10-CM | POA: Diagnosis not present

## 2019-12-06 DIAGNOSIS — Z79899 Other long term (current) drug therapy: Secondary | ICD-10-CM | POA: Diagnosis not present

## 2019-12-06 DIAGNOSIS — I6389 Other cerebral infarction: Secondary | ICD-10-CM | POA: Diagnosis not present

## 2019-12-06 DIAGNOSIS — R9431 Abnormal electrocardiogram [ECG] [EKG]: Secondary | ICD-10-CM | POA: Diagnosis not present

## 2019-12-06 DIAGNOSIS — R410 Disorientation, unspecified: Secondary | ICD-10-CM | POA: Diagnosis not present

## 2019-12-06 DIAGNOSIS — J9690 Respiratory failure, unspecified, unspecified whether with hypoxia or hypercapnia: Secondary | ICD-10-CM

## 2019-12-06 DIAGNOSIS — J189 Pneumonia, unspecified organism: Secondary | ICD-10-CM | POA: Diagnosis not present

## 2019-12-06 DIAGNOSIS — N289 Disorder of kidney and ureter, unspecified: Secondary | ICD-10-CM

## 2019-12-06 DIAGNOSIS — G8194 Hemiplegia, unspecified affecting left nondominant side: Secondary | ICD-10-CM | POA: Diagnosis not present

## 2019-12-06 DIAGNOSIS — R509 Fever, unspecified: Secondary | ICD-10-CM | POA: Diagnosis not present

## 2019-12-06 DIAGNOSIS — I952 Hypotension due to drugs: Secondary | ICD-10-CM | POA: Diagnosis not present

## 2019-12-06 DIAGNOSIS — E876 Hypokalemia: Secondary | ICD-10-CM | POA: Diagnosis not present

## 2019-12-06 DIAGNOSIS — Z9911 Dependence on respirator [ventilator] status: Secondary | ICD-10-CM | POA: Diagnosis not present

## 2019-12-06 DIAGNOSIS — D649 Anemia, unspecified: Secondary | ICD-10-CM | POA: Diagnosis not present

## 2019-12-06 DIAGNOSIS — Z0189 Encounter for other specified special examinations: Secondary | ICD-10-CM | POA: Diagnosis not present

## 2019-12-06 DIAGNOSIS — R0602 Shortness of breath: Secondary | ICD-10-CM | POA: Diagnosis not present

## 2019-12-06 DIAGNOSIS — J9600 Acute respiratory failure, unspecified whether with hypoxia or hypercapnia: Secondary | ICD-10-CM

## 2019-12-06 DIAGNOSIS — E871 Hypo-osmolality and hyponatremia: Secondary | ICD-10-CM | POA: Diagnosis not present

## 2019-12-06 DIAGNOSIS — Z9114 Patient's other noncompliance with medication regimen: Secondary | ICD-10-CM

## 2019-12-06 DIAGNOSIS — G441 Vascular headache, not elsewhere classified: Secondary | ICD-10-CM | POA: Diagnosis not present

## 2019-12-06 DIAGNOSIS — R7401 Elevation of levels of liver transaminase levels: Secondary | ICD-10-CM | POA: Diagnosis not present

## 2019-12-06 DIAGNOSIS — R918 Other nonspecific abnormal finding of lung field: Secondary | ICD-10-CM | POA: Diagnosis not present

## 2019-12-06 DIAGNOSIS — M542 Cervicalgia: Secondary | ICD-10-CM | POA: Diagnosis not present

## 2019-12-06 DIAGNOSIS — R0902 Hypoxemia: Secondary | ICD-10-CM | POA: Diagnosis not present

## 2019-12-06 DIAGNOSIS — G4700 Insomnia, unspecified: Secondary | ICD-10-CM | POA: Diagnosis not present

## 2019-12-06 DIAGNOSIS — I639 Cerebral infarction, unspecified: Secondary | ICD-10-CM | POA: Diagnosis not present

## 2019-12-06 DIAGNOSIS — Z978 Presence of other specified devices: Secondary | ICD-10-CM

## 2019-12-06 DIAGNOSIS — J9602 Acute respiratory failure with hypercapnia: Secondary | ICD-10-CM | POA: Diagnosis not present

## 2019-12-06 DIAGNOSIS — R609 Edema, unspecified: Secondary | ICD-10-CM | POA: Diagnosis not present

## 2019-12-06 DIAGNOSIS — G479 Sleep disorder, unspecified: Secondary | ICD-10-CM | POA: Diagnosis not present

## 2019-12-06 HISTORY — DX: Nontraumatic intracerebral hemorrhage, unspecified: I61.9

## 2019-12-06 LAB — RAPID URINE DRUG SCREEN, HOSP PERFORMED
Amphetamines: NOT DETECTED
Barbiturates: NOT DETECTED
Benzodiazepines: NOT DETECTED
Cocaine: NOT DETECTED
Opiates: NOT DETECTED
Tetrahydrocannabinol: POSITIVE — AB

## 2019-12-06 LAB — DIFFERENTIAL
Abs Immature Granulocytes: 0.07 10*3/uL (ref 0.00–0.07)
Basophils Absolute: 0.1 10*3/uL (ref 0.0–0.1)
Basophils Relative: 1 %
Eosinophils Absolute: 0.2 10*3/uL (ref 0.0–0.5)
Eosinophils Relative: 1 %
Immature Granulocytes: 1 %
Lymphocytes Relative: 33 %
Lymphs Abs: 3.8 10*3/uL (ref 0.7–4.0)
Monocytes Absolute: 0.9 10*3/uL (ref 0.1–1.0)
Monocytes Relative: 7 %
Neutro Abs: 6.7 10*3/uL (ref 1.7–7.7)
Neutrophils Relative %: 57 %

## 2019-12-06 LAB — URINALYSIS, ROUTINE W REFLEX MICROSCOPIC
Bacteria, UA: NONE SEEN
Bilirubin Urine: NEGATIVE
Glucose, UA: >=500 mg/dL — AB
Ketones, ur: NEGATIVE mg/dL
Leukocytes,Ua: NEGATIVE
Nitrite: NEGATIVE
Protein, ur: 30 mg/dL — AB
Specific Gravity, Urine: 1.02 (ref 1.005–1.030)
pH: 7 (ref 5.0–8.0)

## 2019-12-06 LAB — LDL CHOLESTEROL, DIRECT: Direct LDL: 157 mg/dL — ABNORMAL HIGH (ref 0–99)

## 2019-12-06 LAB — COMPREHENSIVE METABOLIC PANEL
ALT: 22 U/L (ref 0–44)
AST: 25 U/L (ref 15–41)
Albumin: 4.1 g/dL (ref 3.5–5.0)
Alkaline Phosphatase: 57 U/L (ref 38–126)
Anion gap: 12 (ref 5–15)
BUN: 14 mg/dL (ref 6–20)
CO2: 23 mmol/L (ref 22–32)
Calcium: 8.9 mg/dL (ref 8.9–10.3)
Chloride: 105 mmol/L (ref 98–111)
Creatinine, Ser: 1.52 mg/dL — ABNORMAL HIGH (ref 0.61–1.24)
GFR calc Af Amer: >60 mL/min (ref >60)
GFR calc non Af Amer: 57 mL/min — ABNORMAL LOW (ref >60)
Glucose, Bld: 183 mg/dL — ABNORMAL HIGH (ref 70–99)
Potassium: 3.2 mmol/L — ABNORMAL LOW (ref 3.5–5.1)
Sodium: 140 mmol/L (ref 135–145)
Total Bilirubin: 0.5 mg/dL (ref 0.3–1.2)
Total Protein: 6.9 g/dL (ref 6.5–8.1)

## 2019-12-06 LAB — I-STAT CHEM 8, ED
BUN: 15 mg/dL (ref 6–20)
Calcium, Ion: 1.18 mmol/L (ref 1.15–1.40)
Chloride: 102 mmol/L (ref 98–111)
Creatinine, Ser: 1.4 mg/dL — ABNORMAL HIGH (ref 0.61–1.24)
Glucose, Bld: 183 mg/dL — ABNORMAL HIGH (ref 70–99)
HCT: 45 % (ref 39.0–52.0)
Hemoglobin: 15.3 g/dL (ref 13.0–17.0)
Potassium: 2.9 mmol/L — ABNORMAL LOW (ref 3.5–5.1)
Sodium: 142 mmol/L (ref 135–145)
TCO2: 24 mmol/L (ref 22–32)

## 2019-12-06 LAB — CBC
HCT: 45.9 % (ref 39.0–52.0)
Hemoglobin: 14.7 g/dL (ref 13.0–17.0)
MCH: 27.4 pg (ref 26.0–34.0)
MCHC: 32 g/dL (ref 30.0–36.0)
MCV: 85.6 fL (ref 80.0–100.0)
Platelets: 351 10*3/uL (ref 150–400)
RBC: 5.36 MIL/uL (ref 4.22–5.81)
RDW: 14.5 % (ref 11.5–15.5)
WBC: 11.6 10*3/uL — ABNORMAL HIGH (ref 4.0–10.5)
nRBC: 0 % (ref 0.0–0.2)

## 2019-12-06 LAB — LIPID PANEL
Cholesterol: 232 mg/dL — ABNORMAL HIGH (ref 0–200)
HDL: 42 mg/dL (ref >40)
LDL Cholesterol: UNDETERMINED mg/dL (ref 0–99)
Total CHOL/HDL Ratio: 5.5 RATIO
Triglycerides: 525 mg/dL — ABNORMAL HIGH (ref <150)
VLDL: UNDETERMINED mg/dL (ref 0–40)

## 2019-12-06 LAB — SODIUM
Sodium: 140 mmol/L (ref 135–145)
Sodium: 141 mmol/L (ref 135–145)
Sodium: 146 mmol/L — ABNORMAL HIGH (ref 135–145)

## 2019-12-06 LAB — ETHANOL: Alcohol, Ethyl (B): <10 mg/dL (ref <10)

## 2019-12-06 LAB — SARS CORONAVIRUS 2 BY RT PCR (HOSPITAL ORDER, PERFORMED IN ~~LOC~~ HOSPITAL LAB): SARS Coronavirus 2: NEGATIVE

## 2019-12-06 LAB — MAGNESIUM: Magnesium: 1.9 mg/dL (ref 1.7–2.4)

## 2019-12-06 LAB — CBG MONITORING, ED: Glucose-Capillary: 158 mg/dL — ABNORMAL HIGH (ref 70–99)

## 2019-12-06 LAB — PROTIME-INR
INR: 0.9 (ref 0.8–1.2)
Prothrombin Time: 11.9 seconds (ref 11.4–15.2)

## 2019-12-06 LAB — APTT: aPTT: 25 seconds (ref 24–36)

## 2019-12-06 LAB — MRSA PCR SCREENING: MRSA by PCR: NEGATIVE

## 2019-12-06 LAB — HIV ANTIBODY (ROUTINE TESTING W REFLEX): HIV Screen 4th Generation wRfx: NONREACTIVE

## 2019-12-06 MED ORDER — SODIUM CHLORIDE 3 % IV SOLN: INTRAVENOUS | Status: DC

## 2019-12-06 MED ORDER — CHLORHEXIDINE GLUCONATE CLOTH 2 % EX PADS
6.0000 | MEDICATED_PAD | Freq: Every day | CUTANEOUS | Status: DC
  Administered 2019-12-06 – 2019-12-15 (×10): 6 via TOPICAL

## 2019-12-06 MED ORDER — CLEVIDIPINE BUTYRATE 0.5 MG/ML IV EMUL
INTRAVENOUS | Status: AC
  Filled 2019-12-06: qty 50

## 2019-12-06 MED ORDER — HYDRALAZINE HCL 20 MG/ML IJ SOLN
20.0000 mg | Freq: Four times a day (QID) | INTRAMUSCULAR | Status: DC | PRN
  Administered 2019-12-06: 20 mg via INTRAVENOUS
  Filled 2019-12-06: qty 1

## 2019-12-06 MED ORDER — POTASSIUM CHLORIDE 10 MEQ/50ML IV SOLN: 10.0000 meq | INTRAVENOUS | Status: DC

## 2019-12-06 MED ORDER — SENNOSIDES-DOCUSATE SODIUM 8.6-50 MG PO TABS
1.0000 | ORAL_TABLET | Freq: Two times a day (BID) | ORAL | Status: DC
  Filled 2019-12-06 (×2): qty 1

## 2019-12-06 MED ORDER — SODIUM CHLORIDE 0.9% FLUSH: 3.0000 mL | Freq: Once | INTRAVENOUS | Status: DC

## 2019-12-06 MED ORDER — SODIUM CHLORIDE 3 % IV SOLN
INTRAVENOUS | Status: DC
  Filled 2019-12-06 (×7): qty 500

## 2019-12-06 MED ORDER — CLONIDINE HCL 0.1 MG/24HR TD PTWK
0.1000 mg | MEDICATED_PATCH | TRANSDERMAL | Status: DC
  Filled 2019-12-06: qty 1

## 2019-12-06 MED ORDER — HYDRALAZINE HCL 20 MG/ML IJ SOLN
5.0000 mg | Freq: Once | INTRAMUSCULAR | Status: AC
  Administered 2019-12-06: 5 mg via INTRAVENOUS
  Filled 2019-12-06: qty 1

## 2019-12-06 MED ORDER — LABETALOL HCL 5 MG/ML IV SOLN
10.0000 mg | Freq: Once | INTRAVENOUS | Status: AC
  Administered 2019-12-06: 10 mg via INTRAVENOUS

## 2019-12-06 MED ORDER — ACETAMINOPHEN 160 MG/5ML PO SOLN
650.0000 mg | ORAL | Status: DC | PRN
  Administered 2019-12-07 – 2019-12-10 (×11): 650 mg
  Filled 2019-12-06 (×15): qty 20.3

## 2019-12-06 MED ORDER — PANTOPRAZOLE SODIUM 40 MG IV SOLR
40.0000 mg | Freq: Every day | INTRAVENOUS | Status: DC
  Administered 2019-12-06 – 2019-12-11 (×6): 40 mg via INTRAVENOUS
  Filled 2019-12-06 (×6): qty 40

## 2019-12-06 MED ORDER — LABETALOL HCL 5 MG/ML IV SOLN
40.0000 mg | INTRAVENOUS | Status: DC | PRN
  Administered 2019-12-06 – 2019-12-08 (×9): 40 mg via INTRAVENOUS
  Administered 2019-12-09 (×2): 20 mg via INTRAVENOUS
  Filled 2019-12-06 (×10): qty 8

## 2019-12-06 MED ORDER — NICARDIPINE HCL IN NACL 20-0.86 MG/200ML-% IV SOLN
INTRAVENOUS | Status: AC
  Filled 2019-12-06: qty 200

## 2019-12-06 MED ORDER — ACETAMINOPHEN 650 MG RE SUPP
650.0000 mg | RECTAL | Status: DC | PRN
  Administered 2019-12-06 – 2019-12-11 (×5): 650 mg via RECTAL
  Filled 2019-12-06 (×7): qty 1

## 2019-12-06 MED ORDER — POTASSIUM CHLORIDE 10 MEQ/100ML IV SOLN
10.0000 meq | INTRAVENOUS | Status: AC
  Administered 2019-12-06 (×2): 10 meq via INTRAVENOUS
  Filled 2019-12-06 (×2): qty 100

## 2019-12-06 MED ORDER — POTASSIUM CHLORIDE CRYS ER 20 MEQ PO TBCR: 40.0000 meq | EXTENDED_RELEASE_TABLET | Freq: Once | ORAL | Status: DC

## 2019-12-06 MED ORDER — ACETAMINOPHEN 325 MG PO TABS
650.0000 mg | ORAL_TABLET | ORAL | Status: DC | PRN
  Administered 2019-12-09 – 2019-12-15 (×12): 650 mg via ORAL
  Filled 2019-12-06 (×12): qty 2

## 2019-12-06 MED ORDER — NICARDIPINE HCL IN NACL 20-0.86 MG/200ML-% IV SOLN
3.0000 mg/h | INTRAVENOUS | Status: DC
  Administered 2019-12-06 (×3): 15 mg/h via INTRAVENOUS
  Administered 2019-12-06: 5 mg/h via INTRAVENOUS
  Administered 2019-12-06 – 2019-12-07 (×6): 15 mg/h via INTRAVENOUS
  Administered 2019-12-08: 12.5 mg/h via INTRAVENOUS
  Administered 2019-12-08: 15 mg/h via INTRAVENOUS
  Administered 2019-12-08: 12.5 mg/h via INTRAVENOUS
  Administered 2019-12-08: 5 mg/h via INTRAVENOUS
  Filled 2019-12-06 (×5): qty 400
  Filled 2019-12-06 (×4): qty 200

## 2019-12-06 MED ORDER — SODIUM CHLORIDE 0.9 % IV SOLN
INTRAVENOUS | Status: DC | PRN
  Administered 2019-12-06: 500 mL via INTRAVENOUS

## 2019-12-06 MED ORDER — SODIUM CHLORIDE 3 % IV SOLN
INTRAVENOUS | Status: DC
  Filled 2019-12-06: qty 500

## 2019-12-06 MED ORDER — STROKE: EARLY STAGES OF RECOVERY BOOK
Freq: Once | Status: AC
  Filled 2019-12-06: qty 1

## 2019-12-06 MED ORDER — POTASSIUM CHLORIDE 10 MEQ/100ML IV SOLN
10.0000 meq | INTRAVENOUS | Status: AC
  Administered 2019-12-06 (×5): 10 meq via INTRAVENOUS
  Filled 2019-12-06 (×5): qty 100

## 2019-12-06 MED ORDER — CLEVIDIPINE BUTYRATE 0.5 MG/ML IV EMUL
0.0000 mg/h | INTRAVENOUS | Status: DC
  Administered 2019-12-06: 28 mg/h via INTRAVENOUS
  Administered 2019-12-06: 32 mg/h via INTRAVENOUS
  Administered 2019-12-06: 21 mg/h via INTRAVENOUS
  Administered 2019-12-06: 1 mg/h via INTRAVENOUS
  Administered 2019-12-06: 30 mg/h via INTRAVENOUS
  Administered 2019-12-06 (×2): 21 mg/h via INTRAVENOUS
  Filled 2019-12-06: qty 50
  Filled 2019-12-06: qty 100
  Filled 2019-12-06 (×6): qty 50

## 2019-12-06 MED ORDER — IOHEXOL 350 MG/ML SOLN
100.0000 mL | Freq: Once | INTRAVENOUS | Status: AC | PRN
  Administered 2019-12-06: 100 mL via INTRAVENOUS

## 2019-12-06 MED ORDER — LABETALOL HCL 5 MG/ML IV SOLN
10.0000 mg | Freq: Once | INTRAVENOUS | Status: AC
  Administered 2019-12-06: 10 mg via INTRAVENOUS
  Filled 2019-12-06: qty 4

## 2019-12-06 NOTE — Progress Notes (Signed)
STROKE TEAM PROGRESS NOTE   INTERVAL HISTORY Patient is sitting up in bed.  He is drowsy but can be easily aroused and follows commands well.  He has dense left hemiplegia and CT angiogram was performed which shows no significant aneurysm or stenosis.  CT scan shows 3.6 x 2.8 x 2.6 cm right basal ganglia hematoma with intraventricular extension and cytotoxic edema and slight midline shift but no hydrocephalus.  Patient has been started on hypertonic saline and blood pressure has been difficult to control despite maximum dose of Cleviprex drip and as needed hydralazine and labetalol Vitals:   12/06/19 1615 12/06/19 1630 12/06/19 1645 12/06/19 1700  BP: 138/84 (!) 141/82 (!) 143/87 (!) 163/85  Pulse: (!) 115 (!) 116 (!) 118 (!) 115  Resp: (!) 35 (!) 33 (!) 31 (!) 35  Temp:      TempSrc:      SpO2: 95% 95% 96% 95%  Weight:      Height:        CBC:  Recent Labs  Lab 12/06/19 0422 12/06/19 0428  WBC 11.6*  --   NEUTROABS 6.7  --   HGB 14.7 15.3  HCT 45.9 45.0  MCV 85.6  --   PLT 351  --     Basic Metabolic Panel:  Recent Labs  Lab 12/06/19 0422 12/06/19 0422 12/06/19 0428 12/06/19 0428 12/06/19 1203 12/06/19 1518  NA 140   < > 142   < > 140 141  K 3.2*  --  2.9*  --   --   --   CL 105  --  102  --   --   --   CO2 23  --   --   --   --   --   GLUCOSE 183*  --  183*  --   --   --   BUN 14  --  15  --   --   --   CREATININE 1.52*  --  1.40*  --   --   --   CALCIUM 8.9  --   --   --   --   --   MG  --   --   --   --  1.9  --    < > = values in this interval not displayed.   Lipid Panel:     Component Value Date/Time   CHOL 232 (H) 12/06/2019 1203   TRIG 525 (H) 12/06/2019 1203   HDL 42 12/06/2019 1203   CHOLHDL 5.5 12/06/2019 1203   VLDL UNABLE TO CALCULATE IF TRIGLYCERIDE OVER 400 mg/dL 12/06/2019 1203   LDLCALC UNABLE TO CALCULATE IF TRIGLYCERIDE OVER 400 mg/dL 12/06/2019 1203   HgbA1c: No results found for: HGBA1C Urine Drug Screen:     Component Value  Date/Time   LABOPIA NONE DETECTED 12/06/2019 1200   COCAINSCRNUR NONE DETECTED 12/06/2019 1200   LABBENZ NONE DETECTED 12/06/2019 1200   AMPHETMU NONE DETECTED 12/06/2019 1200   THCU POSITIVE (A) 12/06/2019 1200   LABBARB NONE DETECTED 12/06/2019 1200    Alcohol Level     Component Value Date/Time   ETH <10 12/06/2019 1203    IMAGING past 24 hours CT ANGIO HEAD W OR WO CONTRAST  Result Date: 12/06/2019 CLINICAL DATA:  Intracranial hemorrhage centered at the thalamus. Left-sided weakness. EXAM: CT ANGIOGRAPHY HEAD TECHNIQUE: Multidetector CT imaging of the head was performed using the standard protocol during bolus administration of intravenous contrast. Multiplanar CT image reconstructions and MIPs were obtained to  evaluate the vascular anatomy. CONTRAST:  189mL OMNIPAQUE IOHEXOL 350 MG/ML SOLN COMPARISON:  CT head without contrast 12/06/2019 FINDINGS: CTA HEAD Anterior circulation: The internal carotid arteries are within normal limits from high cervical segments through the ICA termini. The A1 and M1 segments are normal. The anterior communicating artery is patent. MCA bifurcations are intact. The ACA and MCA branch vessels are within normal limits. Posterior circulation: Vertebral arteries are codominant. Left PICA origin is visualized and normal. Right AICA is dominant. The basilar artery is normal. Both posterior cerebral arteries originate from basilar tip. PCA branch vessels are within normal limits. No CTA spot sign is present. No vascular lesion is so seated with the area of hemorrhage. Venous sinuses: The dural sinuses are patent. The straight sinus and deep cerebral veins are intact. Cortical veins are unremarkable. No vascular malformation is present. Anatomic variants: None IMPRESSION: 1. Normal variant CTA Circle of Willis without significant proximal stenosis, aneurysm, or branch vessel occlusion. 2. No CTA spot sign or vascular malformation to explain the area of hemorrhage.  Electronically Signed   By: San Morelle M.D.   On: 12/06/2019 05:46   CT HEAD WO CONTRAST  Result Date: 12/06/2019 CLINICAL DATA:  Intracranial hemorrhage, follow-up EXAM: CT HEAD WITHOUT CONTRAST TECHNIQUE: Contiguous axial images were obtained from the base of the skull through the vertex without intravenous contrast. COMPARISON:  Earlier same day FINDINGS: Brain: No significant change in size or appearance of parenchymal hemorrhage centered within the right thalamus and adjacent white matter. Similar mild surrounding edema and regional mass effect. Intraventricular extension is again noted. Lateral ventricles remain mildly prominent. No new hemorrhage or loss of gray-white differentiation. Vascular: No new findings. Skull: Unremarkable Sinuses/Orbits: No acute abnormality. Other: None. IMPRESSION: Similar parenchymal hemorrhage centered within the right thalamus with intraventricular extension. Stable mild prominence of the lateral ventricles. No new hemorrhage or worsening mass effect. Electronically Signed   By: Macy Mis M.D.   On: 12/06/2019 16:53   DG Chest Port 1 View  Result Date: 12/06/2019 CLINICAL DATA:  Left-sided paralysis and slurred speech EXAM: PORTABLE CHEST 1 VIEW COMPARISON:  None. FINDINGS: The heart size and mediastinal contours are within normal limits. Both lungs are clear. The visualized skeletal structures are unremarkable. IMPRESSION: No active disease. Electronically Signed   By: Prudencio Pair M.D.   On: 12/06/2019 05:15   CT HEAD CODE STROKE WO CONTRAST  Result Date: 12/06/2019 CLINICAL DATA:  Code stroke.  Acute onset of left-sided weakness. EXAM: CT HEAD WITHOUT CONTRAST TECHNIQUE: Contiguous axial images were obtained from the base of the skull through the vertex without intravenous contrast. COMPARISON:  None. FINDINGS: Brain: Acute hemorrhage centered in the right thalamus measures 3.6 x 2.8 x 2.6 cm. Intraventricular extension is present. Blood is seen in  both lateral ventricles, the third ventricle, and the fourth ventricle. No hydrocephalus is present. Focal mass effect is present without significant midline shift. No significant extra-axial hemorrhage is present. And stem and cerebellum are otherwise within normal limits. Vascular: No hyperdense vessel or unexpected calcification. Skull: Calvarium is intact. No focal lytic or blastic lesions are present. No significant extracranial soft tissue lesion is present. Sinuses/Orbits: The paranasal sinuses and mastoid air cells are clear. The globes and orbits are within normal limits. IMPRESSION: 1. Acute hemorrhage centered in the right thalamus measures 3.6 x 2.8 x 2.6 cm. Estimated volume is 13.1 mL. 2. Hemorrhage extends into the ventricles and can be seen in the third and fourth ventricles without hydrocephalus.  3. Local mass effect without midline shift. The above was relayed via text pager to Dr. Cheral Marker on 12/06/2019 at 04:26 . Electronically Signed   By: San Morelle M.D.   On: 12/06/2019 04:31    PHYSICAL EXAM Pleasant young African-American male not in distress. . Afebrile. Head is nontraumatic. Neck is supple without bruit.    Cardiac exam no murmur or gallop. Lungs are clear to auscultation. Distal pulses are well felt. Neurological Exam :  Patient is drowsy but can be easily aroused.  Is oriented x3.  Eyes are slightly disconjugate with right eye exotropia mild.  Extraocular movements are almost full range though there is slight restriction of left lateral gaze.  There is slight nystagmus on right lateral gaze.  Blinks to threat bilaterally.  Left lower facial weakness.  Tongue midline.  Speech is dysarthric but can be understood with slight difficulty.  Follows commands well.  Dense left hemiplegia 0/5 strength with brisk withdrawal in the lower extremity and trace withdrawal in the left upper extremity.  Normal antigravity strength on the right.  Gait not tested.  Sensation is diminished on  the left compared to the right.  Left plantar upgoing left downgoing. ASSESSMENT/PLAN Mr. Fredric Slabach is a 39 y.o. male with recent HTN at dentist office with plans for PCP visit next week, presenting post coitus with L sided weakness, elevated BP.   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 IVH. Local mass effect w/o midline shift  CTA head Unremarkable   CT head at 12h stable  CT head at 24h pending   MRI  pending   Carotid Doppler  pending   2D Echo pending   LDL UTC d/t high TG, direct LDL 157  HgbA1c No results found for requested labs within last 26280 hours.  SCDs for VTE prophylaxis  No antithrombotic prior to admission, now on No antithrombotic given hemorrhage   Therapy recommendations:  pending   Disposition:  pending   Bedrest for now  Cerebral Edema   On 3% protocol  3% at 50cc/hr via PIV. Increase to 75h  NA 142 -140-141   Goal 150-155-  Hypertensive Emergency w/ Tachycardia  BP as high as 219/162   Home meds:  None - has PCP appt June 29th - referred to PCP by Dentist who noted high BP  SBP goal < 140  In hospital BP difficult to control. Received cleviprex at 21, hydralazine and labetolol,  Change Cleviprex to to cardene . Added clonidine patch 0.1mg   . Long-term BP goal normotensive  Hyperlipidemia  Home meds: none   LDL 157  Hold statin in setting of acute hemorrhage   Consider statin at d/c or f/u appt  Dysphagia . Secondary to stroke . NPO . Speech on board  Other Stroke Risk Factors  ETOH use, advised to drink no more than 2 drink(s) a day  Substance abuse - UDS:  THC Positive. Patient advised to stop using due to stroke risk.  Other Active Problems  Hypokalemia 3.2-2.9 - replace (Mag ok) - recheck in am  AKI Cre 1.5-1.4. on IVF  Hospital day # 0 Patient presented with elevated blood pressure and left hemiplegia due to large right basal ganglia hemorrhage  with cytotoxic edema and slight intraventricular extension but no hydrocephalus.  He remains at risk for neurological worsening and hematoma expansion and development of hydrocephalus.  Recommend strict blood pressure control with systolic blood pressure goal below  140 for 24 hours and then below 160.  Continue Cleviprex drip and maximize as needed labetalol and hydralazine.  Start oral blood pressure medication when able to swallow or has a feeding tube.  Repeat CT scan of the head without contrast tomorrow.  No family available for discussion. This patient is critically ill and at significant risk of neurological worsening, death and care requires constant monitoring of vital signs, hemodynamics,respiratory and cardiac monitoring, extensive review of multiple databases, frequent neurological assessment, discussion with family, other specialists and medical decision making of high complexity.I have made any additions or clarifications directly to the above note.This critical care time does not reflect procedure time, or teaching time or supervisory time of PA/NP/Med Resident etc but could involve care discussion time.  I spent 30 minutes of neurocritical care time  in the care of  this patient.   Antony Contras, MD  To contact Stroke Continuity provider, please refer to [email or web address] After hours, contact General Neurology

## 2019-12-06 NOTE — Progress Notes (Signed)
Pt's wedding band was given to his wife to take home.

## 2019-12-06 NOTE — Code Documentation (Signed)
Responded to Code Stroke called at 0354 for L sided weakness, visual impairment, and slurred speech, [id number]. Pt arrived at 0411, ABG-122, NIH-17, CT head-acute hemorrhage. Pt given 10mg  labetolol at 0424 and cleviprex gtt started at 0433 . Plan for Neurosurgery consult.

## 2019-12-06 NOTE — ED Triage Notes (Signed)
CODE Stroke LKN 12/06/2019 0330   Pt BIB [facility]. Pt states he was having sex when he got up and began to developed left side paralysis, loss of left visual field, and slurred speech. In route, x1 emesis. C/O right side headache.   Pt arrives A&Ox4, able to follow commands, with left side paralysis.   EMS VS BP 207/148 HR 92 SpO2 97% RA CBG 122

## 2019-12-06 NOTE — ED Notes (Signed)
Lindzen paged to come see pt on floor. Cheral Marker is in another case not answering phone. He is aware via text BP continues to be high spite meds given in ED

## 2019-12-06 NOTE — H&P (Signed)
Admission H&P    Chief Complaint: Acute onset of left sided weakness after coitus  HPI: Dennis Mercado is an 39 y.o. male with a PMHx of HTN who presents via EMS as a Code Stroke after acute onset of left sided weakness and headache within minutes of finishing coitus with his wife. He states that he felt unwell almost immediately after sex, then went to the bathroom and laid on the floor, where his wife noted that his left side was weak and that he was starting to look altered. Left facial weakness was also noted. His wife then called EMS. On arrival EMS noted the same deficits. Vitals per EMS: BP 207/148, HR 92, 97% on RA, CBG 122, afebrile. LKN was 80  PMHx  HTN  Meds: None  No past surgical history on file.  No family history on file. Social History:  reports that he has never smoked. He has never used smokeless tobacco. He reports current alcohol use. He reports that he does not use drugs.  Allergies: No Known Allergies  (Not in a hospital admission)   ROS: The patient does not appear to be aware of his left sided weakness. ROS not reliable in the context of his anosognosia.   Physical Examination: There were no vitals taken for this visit.  HEENT- [place]/AT Lungs - Respirations unlabored Extremities - No edema  Neurologic Examination: Mental Status: Depressed level of consciousness. Appears confused and with decreased awareness of his left sided deficits. Oriented to year, month and "Saturday". Correctly identifies the city and state. Speech dysarthric but fluent, with sparse output. Able to follow all right sided motor commands. Impaired repetition. Becomes confused with complex motor commands.  Cranial Nerves: II:  Left visual field cut. PERRL.  III,IV, VI: Eyes dysconjugate with exotropia that waxes and wanes. Coarse horizontal and vertical nystagmus intermittently noted.  V,VII: Left facial droop. Temp sensation equal bilaterally  VIII: hearing intact to voice IX,X: No  hypophonia XI: Decreased shoulder shrug on the left XII: midline tongue extension Motor: RUE 5/5 RLE 5/5 LUE 0/5 LLE 1-2/5 Sensory: Insensate to temp, FT and pressure, LUE and LLE Deep Tendon Reflexes:  Reflexes more brisk on the left than the right.  Plantars: Upgoing bilaterally  Cerebellar: Dysmetric with right FNF. Unable to perform on the left.  Gait: Unable to assess  Results for orders placed or performed during the hospital encounter of 12/06/19 (from the past 48 hour(s))  CBG monitoring, ED     Status: Abnormal   Collection Time: 12/06/19  4:15 AM  Result Value Ref Range   Glucose-Capillary 158 (H) 70 - 99 mg/dL    Comment: Glucose reference range applies only to samples taken after fasting for at least 8 hours.     Assessment: 39 year old male presenting with acute onset of postcoital headache, N/V and left hemiplegia 1. CT head reveals an acute hemorrhage centered in the right thalamus measuring 3.6 x 2.8 x 2.6 cm. Estimated volume is 13.1 mL. The hemorrhage extends into the ventricles and can be seen in the third and fourth ventricles without hydrocephalus. There is local mass effect without midline shift. 2. Exam reveals findings referable to the right basal ganglia ICH. His drowsiness is likely related to the intraventricular blood.   Recommendations: 1. Admit to ICU under Neurology service 2. MR of head when stable 3. PT and OT consults, when able to tolerate 4. Speech/wallow evaluation 5. Cardiac telemetry 6. Frequent neuro checks 7. Repeat CT head in 6 hours.  8. Hypertonic saline 3% at 50 cc/hr 9. BP management with clevidipine drip. SBP goal < 140 10. No antiplatelet medications or anticoagulants. DVT prophylaxis with SCDs 11. STAT CTA of head to assess for AVM or ruptured aneurysm.  12. Neurosurgery has been consulted. 13. Discussed the patient's condition with his wife, April. She can be reached at [phone]. She wishes for him to be a Full Code.    50 minutes spent in the emergent neurological evaluation and management of this critically ill paitent.  Electronically signed: Dr. Kerney Elbe 12/06/2019, 4:19 AM

## 2019-12-06 NOTE — ED Provider Notes (Signed)
[place] EMERGENCY DEPARTMENT Provider Note  CSN: [id number] Arrival date & time: 12/06/19  3151  An emergency department physician performed an initial assessment on this suspected stroke patient at 0414.  History Chief Complaint  Patient presents with  . Code Stroke    Dennis Mercado is a 39 y.o. male.  The history is provided by the patient and the EMS personnel.  He has history of hypertension but is not on any medication and is brought in as a code stroke.  He had onset about 3:10 AM of a severe right occipital headache which occurred shortly after an episode of sexual intercourse.  He rates pain at 5/10.  There was some nausea but no vomiting.  He denied any visual change.  He noted that he is unable to use his left arm or left leg.  He states he is scheduled to see a primary care provider in the near future to possibly start on medication for his blood pressure.  He is a non-smoker and denies alcohol or drug use.  No past medical history on file.  Patient Active Problem List   Diagnosis Date Noted  . ICH (intracerebral hemorrhage) ([place]) 12/06/2019    No past surgical history on file.     No family history on file.  Social History   Tobacco Use  . Smoking status: Never Smoker  . Smokeless tobacco: Never Used  Substance Use Topics  . Alcohol use: Yes    Comment: several drinks over the weekend  . Drug use: Never    Home Medications Prior to Admission medications   Not on File    Allergies    Patient has no known allergies.  Review of Systems   Review of Systems  All other systems reviewed and are negative.   Physical Exam Updated Vital Signs BP (!) 208/137   Pulse 85   Resp (!) 31   SpO2 100%   Physical Exam Vitals and nursing note reviewed.   39 year old male, resting comfortably and in no acute distress. Vital signs are significant for markedly elevated blood pressure and elevated respiratory rate. Oxygen saturation is 100%,  which is normal. Head is normocephalic and atraumatic. PERRLA, EOMI. Oropharynx is clear. Neck is nontender and supple without adenopathy or JVD. Back is nontender and there is no CVA tenderness. Lungs are clear without rales, wheezes, or rhonchi. Chest is nontender. Heart has regular rate and rhythm without murmur. Abdomen is soft, flat, nontender without masses or hepatosplenomegaly and peristalsis is normoactive. Extremities have no cyanosis or edema, full range of motion is present. Skin is warm and dry without rash. Neurologic: Awake and alert and oriented, speech is normal.  There is a moderate to severe left central facial droop.  There is a dense left hemiparesis with anesthesia of the left side of the body but sparing the face.  No Babinski response is elicited.  ED Results / Procedures / Treatments   Labs (all labs ordered are listed, but only abnormal results are displayed) Labs Reviewed  CBC - Abnormal; Notable for the following components:      Result Value   WBC 11.6 (*)    All other components within normal limits  COMPREHENSIVE METABOLIC PANEL - Abnormal; Notable for the following components:   Potassium 3.2 (*)    Glucose, Bld 183 (*)    Creatinine, Ser 1.52 (*)    GFR calc non Af Amer 57 (*)    All other components within normal  limits  CBG MONITORING, ED - Abnormal; Notable for the following components:   Glucose-Capillary 158 (*)    All other components within normal limits  I-STAT CHEM 8, ED - Abnormal; Notable for the following components:   Potassium 2.9 (*)    Creatinine, Ser 1.40 (*)    Glucose, Bld 183 (*)    All other components within normal limits  SARS CORONAVIRUS 2 BY RT PCR (HOSPITAL ORDER, [facility])  PROTIME-INR  APTT  DIFFERENTIAL  HIV ANTIBODY (ROUTINE TESTING W REFLEX)  LIPID PANEL  ETHANOL  HEMOGLOBIN A1C  RAPID URINE DRUG SCREEN, HOSP PERFORMED  URINALYSIS, ROUTINE W REFLEX MICROSCOPIC  MAGNESIUM  CBG  MONITORING, ED    EKG EKG Interpretation  Date/Time:  Monday December 06 2019 04:42:55 EDT Ventricular Rate:  97 PR Interval:    QRS Duration: 103 QT Interval:  378 QTC Calculation: 481 R Axis:   75 Text Interpretation: Sinus rhythm Probable left atrial enlargement Left ventricular hypertrophy Abnormal T, consider ischemia, lateral leads ST elevation, consider anterior injury ST-T changes are likely secondary to left ventricular hypertrophy No old tracing to compare Confirmed by Delora Fuel ([id number]) on 12/06/2019 5:00:04 AM   Radiology CT HEAD CODE STROKE WO CONTRAST  Result Date: 12/06/2019 CLINICAL DATA:  Code stroke.  Acute onset of left-sided weakness. EXAM: CT HEAD WITHOUT CONTRAST TECHNIQUE: Contiguous axial images were obtained from the base of the skull through the vertex without intravenous contrast. COMPARISON:  None. FINDINGS: Brain: Acute hemorrhage centered in the right thalamus measures 3.6 x 2.8 x 2.6 cm. Intraventricular extension is present. Blood is seen in both lateral ventricles, the third ventricle, and the fourth ventricle. No hydrocephalus is present. Focal mass effect is present without significant midline shift. No significant extra-axial hemorrhage is present. And stem and cerebellum are otherwise within normal limits. Vascular: No hyperdense vessel or unexpected calcification. Skull: Calvarium is intact. No focal lytic or blastic lesions are present. No significant extracranial soft tissue lesion is present. Sinuses/Orbits: The paranasal sinuses and mastoid air cells are clear. The globes and orbits are within normal limits. IMPRESSION: 1. Acute hemorrhage centered in the right thalamus measures 3.6 x 2.8 x 2.6 cm. Estimated volume is 13.1 mL. 2. Hemorrhage extends into the ventricles and can be seen in the third and fourth ventricles without hydrocephalus. 3. Local mass effect without midline shift. The above was relayed via text pager to Dr. Cheral Marker on 12/06/2019 at 04:26 .  Electronically Signed   By: San Morelle M.D.   On: 12/06/2019 04:31    Procedures Procedures  CRITICAL CARE Performed by: Delora Fuel Total critical care time: 55 minutes Critical care time was exclusive of separately billable procedures and treating other patients. Critical care was necessary to treat or prevent imminent or life-threatening deterioration. Critical care was time spent personally by me on the following activities: development of treatment plan with patient and/or surrogate as well as nursing, discussions with consultants, evaluation of patient's response to treatment, examination of patient, obtaining history from patient or surrogate, ordering and performing treatments and interventions, ordering and review of laboratory studies, ordering and review of radiographic studies, pulse oximetry and re-evaluation of patient's condition.  Medications Ordered in ED Medications  sodium chloride flush (NS) 0.9 % injection 3 mL (3 mLs Intravenous Not Given 12/06/19 0433)  labetalol (NORMODYNE) injection 10 mg (has no administration in time range)    And  clevidipine (CLEVIPREX) infusion 0.5 mg/mL (21 mg/hr Intravenous Rate/Dose Change 12/06/19  0438)  clevidipine (CLEVIPREX) 0.5 MG/ML infusion (0 mg/hr  Hold 12/06/19 0451)   stroke: mapping our early stages of recovery book (has no administration in time range)  acetaminophen (TYLENOL) tablet 650 mg (has no administration in time range)    Or  acetaminophen (TYLENOL) 160 MG/5ML solution 650 mg (has no administration in time range)    Or  acetaminophen (TYLENOL) suppository 650 mg (has no administration in time range)  senna-docusate (Senokot-S) tablet 1 tablet (has no administration in time range)  pantoprazole (PROTONIX) injection 40 mg (has no administration in time range)    ED Course  I have reviewed the triage vital signs and the nursing notes.  Pertinent labs & imaging results that were available during my care of the  patient were reviewed by me and considered in my medical decision making (see chart for details).  MDM Rules/Calculators/A&P Headache with left hemiparesis concerning for subarachnoid hemorrhage, intracranial hemorrhage, stroke.  This differential includes conditions with severe morbidity and mortality.  He is sent for emergent CT scan of the head.  CT scan shows an intracerebral hemorrhage on the right with extension into the lateral ventricle.  He is given intravenous labetalol and clevidipine for blood pressure control.  Patient was seen in conjunction with Dr. Cheral Marker of neurology service who will admit the patient.  Consultation is obtained with Dr. Christella Noa of neurosurgery service.  I have discussed the case with Dr. Christella Noa who will discuss case with Dr. Cheral Marker.  ECG shows LVH with T wave inversion in the anterolateral leads probably secondary to LVH.  Labs show elevated glucose, elevated creatinine, hypokalemia.  He is given oral and intravenous potassium and will check serum magnesium.  Final Clinical Impression(s) / ED Diagnoses Final diagnoses:  Nontraumatic subcortical hemorrhage of right cerebral hemisphere [facility])  Hypertensive urgency, malignant  Renal insufficiency  Hypokalemia    Rx / DC Orders ED Discharge Orders    None       Delora Fuel, MD 37/54/36 [phone]

## 2019-12-07 ENCOUNTER — Inpatient Hospital Stay (HOSPITAL_COMMUNITY): Payer: BC Managed Care – PPO

## 2019-12-07 DIAGNOSIS — I629 Nontraumatic intracranial hemorrhage, unspecified: Secondary | ICD-10-CM

## 2019-12-07 DIAGNOSIS — I6389 Other cerebral infarction: Secondary | ICD-10-CM

## 2019-12-07 DIAGNOSIS — R569 Unspecified convulsions: Secondary | ICD-10-CM

## 2019-12-07 DIAGNOSIS — J9601 Acute respiratory failure with hypoxia: Secondary | ICD-10-CM

## 2019-12-07 DIAGNOSIS — J189 Pneumonia, unspecified organism: Secondary | ICD-10-CM

## 2019-12-07 DIAGNOSIS — I610 Nontraumatic intracerebral hemorrhage in hemisphere, subcortical: Secondary | ICD-10-CM

## 2019-12-07 LAB — CBC
HCT: 42.3 % (ref 39.0–52.0)
Hemoglobin: 13.4 g/dL (ref 13.0–17.0)
MCH: 27.2 pg (ref 26.0–34.0)
MCHC: 31.7 g/dL (ref 30.0–36.0)
MCV: 85.8 fL (ref 80.0–100.0)
Platelets: 323 10*3/uL (ref 150–400)
RBC: 4.93 MIL/uL (ref 4.22–5.81)
RDW: 15.3 % (ref 11.5–15.5)
WBC: 20.6 10*3/uL — ABNORMAL HIGH (ref 4.0–10.5)
nRBC: 0 % (ref 0.0–0.2)

## 2019-12-07 LAB — POCT I-STAT 7, (LYTES, BLD GAS, ICA,H+H)
Acid-base deficit: 5 mmol/L — ABNORMAL HIGH (ref 0.0–2.0)
Acid-base deficit: 5 mmol/L — ABNORMAL HIGH (ref 0.0–2.0)
Bicarbonate: 17.6 mmol/L — ABNORMAL LOW (ref 20.0–28.0)
Bicarbonate: 20.6 mmol/L (ref 20.0–28.0)
Calcium, Ion: 1.15 mmol/L (ref 1.15–1.40)
Calcium, Ion: 1.18 mmol/L (ref 1.15–1.40)
HCT: 38 % — ABNORMAL LOW (ref 39.0–52.0)
HCT: 38 % — ABNORMAL LOW (ref 39.0–52.0)
Hemoglobin: 12.9 g/dL — ABNORMAL LOW (ref 13.0–17.0)
Hemoglobin: 12.9 g/dL — ABNORMAL LOW (ref 13.0–17.0)
O2 Saturation: 93 %
O2 Saturation: 93 %
Patient temperature: 99.4
Potassium: 4.1 mmol/L (ref 3.5–5.1)
Potassium: 4.1 mmol/L (ref 3.5–5.1)
Sodium: 153 mmol/L — ABNORMAL HIGH (ref 135–145)
Sodium: 151 mmol/L — ABNORMAL HIGH (ref 135–145)
TCO2: 18 mmol/L — ABNORMAL LOW (ref 22–32)
TCO2: 22 mmol/L (ref 22–32)
pCO2 arterial: 26.8 mmHg — ABNORMAL LOW (ref 32.0–48.0)
pCO2 arterial: 42.2 mmHg (ref 32.0–48.0)
pH, Arterial: 7.426 (ref 7.350–7.450)
pH, Arterial: 7.298 — ABNORMAL LOW (ref 7.350–7.450)
pO2, Arterial: 64 mmHg — ABNORMAL LOW (ref 83.0–108.0)
pO2, Arterial: 77 mmHg — ABNORMAL LOW (ref 83.0–108.0)

## 2019-12-07 LAB — HEMOGLOBIN A1C
Hgb A1c MFr Bld: 5.6 % (ref 4.8–5.6)
Mean Plasma Glucose: 114 mg/dL

## 2019-12-07 LAB — BODY FLUID CELL COUNT WITH DIFFERENTIAL
Eos, Fluid: 0 %
Lymphs, Fluid: 9 %
Monocyte-Macrophage-Serous Fluid: 7 % — ABNORMAL LOW (ref 50–90)
Neutrophil Count, Fluid: 84 % — ABNORMAL HIGH (ref 0–25)
Total Nucleated Cell Count, Fluid: 640 cu mm (ref 0–1000)

## 2019-12-07 LAB — BASIC METABOLIC PANEL
Anion gap: 10 (ref 5–15)
BUN: 12 mg/dL (ref 6–20)
CO2: 17 mmol/L — ABNORMAL LOW (ref 22–32)
Calcium: 8.1 mg/dL — ABNORMAL LOW (ref 8.9–10.3)
Chloride: 120 mmol/L — ABNORMAL HIGH (ref 98–111)
Creatinine, Ser: 1.44 mg/dL — ABNORMAL HIGH (ref 0.61–1.24)
GFR calc Af Amer: >60 mL/min (ref >60)
GFR calc non Af Amer: >60 mL/min (ref >60)
Glucose, Bld: 137 mg/dL — ABNORMAL HIGH (ref 70–99)
Potassium: 4.2 mmol/L (ref 3.5–5.1)
Sodium: 147 mmol/L — ABNORMAL HIGH (ref 135–145)

## 2019-12-07 LAB — SODIUM
Sodium: 150 mmol/L — ABNORMAL HIGH (ref 135–145)
Sodium: 155 mmol/L — ABNORMAL HIGH (ref 135–145)

## 2019-12-07 LAB — ECHOCARDIOGRAM COMPLETE
Height: 67 in
Weight: 2649.05 oz

## 2019-12-07 LAB — TRIGLYCERIDES: Triglycerides: 126 mg/dL (ref <150)

## 2019-12-07 MED ORDER — SENNOSIDES 8.8 MG/5ML PO SYRP
5.0000 mL | ORAL_SOLUTION | Freq: Two times a day (BID) | ORAL | Status: DC
  Administered 2019-12-07 – 2019-12-10 (×6): 5 mL
  Filled 2019-12-07 (×7): qty 5

## 2019-12-07 MED ORDER — SODIUM CHLORIDE 0.9 % IV SOLN
3.0000 g | Freq: Four times a day (QID) | INTRAVENOUS | Status: AC
  Administered 2019-12-07 – 2019-12-14 (×28): 3 g via INTRAVENOUS
  Filled 2019-12-07 (×3): qty 3
  Filled 2019-12-07: qty 0.1
  Filled 2019-12-07 (×2): qty 3
  Filled 2019-12-07: qty 8
  Filled 2019-12-07: qty 0.1
  Filled 2019-12-07: qty 3
  Filled 2019-12-07: qty 8
  Filled 2019-12-07 (×4): qty 3
  Filled 2019-12-07: qty 8
  Filled 2019-12-07: qty 0.1
  Filled 2019-12-07 (×3): qty 3
  Filled 2019-12-07 (×4): qty 0.1
  Filled 2019-12-07: qty 3
  Filled 2019-12-07: qty 8
  Filled 2019-12-07 (×2): qty 3
  Filled 2019-12-07: qty 8
  Filled 2019-12-07: qty 3

## 2019-12-07 MED ORDER — DOCUSATE SODIUM 50 MG/5ML PO LIQD
100.0000 mg | Freq: Two times a day (BID) | ORAL | Status: DC
  Administered 2019-12-07 – 2019-12-10 (×7): 100 mg
  Filled 2019-12-07 (×9): qty 10

## 2019-12-07 MED ORDER — MIDAZOLAM HCL 2 MG/2ML IJ SOLN
2.0000 mg | Freq: Once | INTRAMUSCULAR | Status: AC
  Administered 2019-12-07: 2 mg via INTRAVENOUS

## 2019-12-07 MED ORDER — PROPOFOL 1000 MG/100ML IV EMUL
INTRAVENOUS | Status: AC
  Administered 2019-12-07: 10 ug/kg/min
  Filled 2019-12-07: qty 100

## 2019-12-07 MED ORDER — FENTANYL CITRATE (PF) 100 MCG/2ML IJ SOLN: 100.0000 ug | Freq: Once | INTRAMUSCULAR | Status: AC

## 2019-12-07 MED ORDER — CHLORHEXIDINE GLUCONATE 0.12% ORAL RINSE (MEDLINE KIT)
15.0000 mL | Freq: Two times a day (BID) | OROMUCOSAL | Status: DC
  Administered 2019-12-07 – 2019-12-15 (×16): 15 mL via OROMUCOSAL

## 2019-12-07 MED ORDER — STERILE WATER FOR INJECTION IJ SOLN
INTRAMUSCULAR | Status: AC
  Filled 2019-12-07: qty 10

## 2019-12-07 MED ORDER — FENTANYL CITRATE (PF) 100 MCG/2ML IJ SOLN
INTRAMUSCULAR | Status: AC
  Administered 2019-12-07: 100 ug
  Filled 2019-12-07: qty 4

## 2019-12-07 MED ORDER — PROPOFOL 1000 MG/100ML IV EMUL: 5.0000 ug/kg/min | INTRAVENOUS | Status: DC

## 2019-12-07 MED ORDER — SODIUM CHLORIDE 0.9% FLUSH
10.0000 mL | Freq: Two times a day (BID) | INTRAVENOUS | Status: DC
  Administered 2019-12-07: 10 mL
  Administered 2019-12-07 – 2019-12-08 (×2): 20 mL
  Administered 2019-12-09 (×2): 10 mL
  Administered 2019-12-10: 20 mL
  Administered 2019-12-11: 10 mL
  Administered 2019-12-11: 20 mL
  Administered 2019-12-12: 10 mL
  Administered 2019-12-13: 20 mL
  Administered 2019-12-13 – 2019-12-14 (×3): 10 mL
  Administered 2019-12-14: 20 mL
  Administered 2019-12-15: 10 mL

## 2019-12-07 MED ORDER — SODIUM CHLORIDE 0.9 % IV SOLN
INTRAVENOUS | Status: DC | PRN
  Administered 2019-12-07: 500 mL via INTRAVENOUS

## 2019-12-07 MED ORDER — ORAL CARE MOUTH RINSE
15.0000 mL | OROMUCOSAL | Status: DC
  Administered 2019-12-07 – 2019-12-12 (×48): 15 mL via OROMUCOSAL

## 2019-12-07 MED ORDER — ROCURONIUM BROMIDE 10 MG/ML (PF) SYRINGE: 10.0000 mg | PREFILLED_SYRINGE | Freq: Once | INTRAVENOUS | Status: DC

## 2019-12-07 MED ORDER — PROPOFOL 1000 MG/100ML IV EMUL
0.0000 ug/kg/min | INTRAVENOUS | Status: DC
  Administered 2019-12-07: 50 ug/kg/min via INTRAVENOUS
  Administered 2019-12-07: 40 ug/kg/min via INTRAVENOUS
  Administered 2019-12-07: 50 ug/kg/min via INTRAVENOUS
  Administered 2019-12-07: 45 ug/kg/min via INTRAVENOUS
  Administered 2019-12-07: 50 ug/kg/min via INTRAVENOUS
  Administered 2019-12-08: 40 ug/kg/min via INTRAVENOUS
  Administered 2019-12-08 (×2): 50 ug/kg/min via INTRAVENOUS
  Administered 2019-12-08: 40 ug/kg/min via INTRAVENOUS
  Administered 2019-12-08: 25 ug/kg/min via INTRAVENOUS
  Administered 2019-12-09 (×3): 40 ug/kg/min via INTRAVENOUS
  Administered 2019-12-09: 50 ug/kg/min via INTRAVENOUS
  Administered 2019-12-09: 40 ug/kg/min via INTRAVENOUS
  Administered 2019-12-10 (×2): 50 ug/kg/min via INTRAVENOUS
  Administered 2019-12-10: 40 ug/kg/min via INTRAVENOUS
  Filled 2019-12-07 (×17): qty 100

## 2019-12-07 MED ORDER — SODIUM CHLORIDE 0.9% FLUSH: 10.0000 mL | INTRAVENOUS | Status: DC | PRN

## 2019-12-07 MED ORDER — FENTANYL CITRATE (PF) 100 MCG/2ML IJ SOLN
50.0000 ug | INTRAMUSCULAR | Status: AC | PRN
  Administered 2019-12-08 (×3): 50 ug via INTRAVENOUS

## 2019-12-07 MED ORDER — FENTANYL CITRATE (PF) 100 MCG/2ML IJ SOLN: 25.0000 ug | INTRAMUSCULAR | Status: DC | PRN

## 2019-12-07 MED ORDER — ETOMIDATE 2 MG/ML IV SOLN
20.0000 mg | Freq: Once | INTRAVENOUS | Status: AC
  Administered 2019-12-07: 20 mg via INTRAVENOUS

## 2019-12-07 MED ORDER — FENTANYL BOLUS VIA INFUSION
50.0000 ug | INTRAVENOUS | Status: DC | PRN
  Administered 2019-12-08: 50 ug via INTRAVENOUS
  Filled 2019-12-07: qty 50

## 2019-12-07 MED ORDER — POLYETHYLENE GLYCOL 3350 17 G PO PACK
17.0000 g | PACK | Freq: Every day | ORAL | Status: DC
  Administered 2019-12-09 – 2019-12-10 (×2): 17 g
  Filled 2019-12-07 (×2): qty 1

## 2019-12-07 MED ORDER — FENTANYL CITRATE (PF) 100 MCG/2ML IJ SOLN
INTRAMUSCULAR | Status: AC
  Administered 2019-12-07: 100 ug
  Filled 2019-12-07: qty 2

## 2019-12-07 MED ORDER — IOHEXOL 350 MG/ML SOLN
100.0000 mL | Freq: Once | INTRAVENOUS | Status: AC
  Administered 2019-12-07: 140 mL via INTRAVENOUS

## 2019-12-07 MED ORDER — VECURONIUM BROMIDE 10 MG IV SOLR
INTRAVENOUS | Status: AC
  Administered 2019-12-07: 10 mg
  Filled 2019-12-07: qty 10

## 2019-12-07 MED ORDER — FENTANYL 2500MCG IN NS 250ML (10MCG/ML) PREMIX INFUSION
0.0000 ug/h | INTRAVENOUS | Status: DC
  Administered 2019-12-07: 150 ug/h via INTRAVENOUS
  Administered 2019-12-07: 100 ug/h via INTRAVENOUS
  Administered 2019-12-08: 125 ug/h via INTRAVENOUS
  Administered 2019-12-08: 150 ug/h via INTRAVENOUS
  Administered 2019-12-09: 125 ug/h via INTRAVENOUS
  Administered 2019-12-10: 150 ug/h via INTRAVENOUS
  Filled 2019-12-07 (×6): qty 250

## 2019-12-07 MED ORDER — MIDAZOLAM HCL 2 MG/2ML IJ SOLN
INTRAMUSCULAR | Status: AC
  Filled 2019-12-07: qty 2

## 2019-12-07 NOTE — Progress Notes (Signed)
RT NOTE: RT assisted CCM with bedside bronchoscopy. Patient tolerated well. Vitals are stable. RT will continue to monitor.

## 2019-12-07 NOTE — Progress Notes (Signed)
Called to bedside due to tachypnea and increasing confusion. On arrival, he is awake, able to answer questions, but appears to be responding to unseen stimuli, more confused than previous exams. He has a left hemiparesis, but moving his right side well. CXR with no clear cause, abg shows a ph of 7.4, PaCO2 of 26, PaO2 of 64(on non-rebreather) .   CT head from a few hours ago shows increasing ventricular size.   Possibilities include some type of pulmonary process(e.g. PE) vs hydrocephalus driving this. With low PaO2, will get stat CTA chest/PE study while we are getting repeat head CT.   With his fatiguing respirations, CCM was consulted and he was intubated prior to heading to CT, also having CVC placed given poor access and needing 3% NaCl.   I have discussed his case with Dr. Kathyrn Sheriff of neurosurgery who will see him in consultation and updated his wife on current events.    This patient is critically ill and at significant risk of neurological worsening, death and care requires constant monitoring of vital signs, hemodynamics,respiratory and cardiac monitoring, neurological assessment, discussion with family, other specialists and medical decision making of high complexity. I spent 60 minutes of neurocritical care time  in the care of  this patient. This was time spent independent of any time provided by nurse practitioner or PA.  Roland Rack, MD [facility] [phone]  If 7pm- 7am, please page neurology on call as listed in [employer]. 12/07/2019  7:35 AM

## 2019-12-07 NOTE — Progress Notes (Signed)
Pharmacy Antibiotic Note  Dennis Mercado is a 39 y.o. male admitted on 12/06/2019 with R thalamic hemorrhage with intraventricular extension and cytotoxic edema. He was intubated overnight with fatiguing respirations. Pharmacy has been consulted for Unasyn dosing.  Plan: Unasyn 3 g IV q6h Monitor cultures, renal function, and LOT  Height: 5\' 7"  (170.2 cm) Weight: 75.1 kg (165 lb 9.1 oz) IBW/kg (Calculated) : 66.1  Temp (24hrs), Avg:100 F (37.8 C), Min:99.3 F (37.4 C), Max:100.4 F (38 C)  Recent Labs  Lab 12/06/19 0422 12/06/19 0428 12/07/19 0519  WBC 11.6*  --  20.6*  CREATININE 1.52* 1.40* 1.44*    Estimated Creatinine Clearance: 65 mL/min (A) (by C-G formula based on SCr of 1.44 mg/dL (H)).    No Known Allergies  Antimicrobials this admission: Unasyn 6/22 >>   Dose adjustments this admission: None  Microbiology results: 6/21 MRSA PCR: neg  Thank you for allowing pharmacy to be a part of this patient's care.  Vertis Kelch, PharmD, [facility] PGY2 Cardiology Pharmacy Resident Phone [phone] 12/07/2019       9:50 AM  Please check [email or web address] for unit-specific pharmacist phone numbers

## 2019-12-07 NOTE — Progress Notes (Signed)
SLP Cancellation Note  Patient Details Name: Dennis Mercado MRN: [id number] DOB: [birth year]   Cancelled treatment:       Reason Eval/Treat Not Completed: Patient not medically ready. Pt now intubated. Will f/u when ready for SLP   Josette Shimabukuro, Katherene Ponto 12/07/2019, 8:32 AM

## 2019-12-07 NOTE — Progress Notes (Signed)
RT NOTE: RT transported patient on ventilator from room 4N19 to CT and back to room 4N19 with no complications. Vitals are stable. RT will continue to monitor. 

## 2019-12-07 NOTE — Progress Notes (Signed)
STROKE TEAM PROGRESS NOTE   INTERVAL HISTORY Patient developed respiratory distress this morning before rounds with hypoxia and required emergent intubation and ventilatory support for respiratory failure.  Is presently sedated and intubated.  Blood pressure adequately controlled.  CT scan of the head earlier today had shown stable appearance of his basal ganglia hemorrhage and intraventricular blood.  Repeat CT scan of the head after his intubation showed unchanged appearance of the right thalamic and intraventricular hemorrhage with 5 mm leftward midline shift.  CT angiogram of the chest is nondiagnostic for pulmonary embolism secondary to poor contrast bolus and artifact however there is moderate left and small right-sided pleural effusion with associated atelectasis or consolidation Vitals:   12/07/19 0700 12/07/19 0721 12/07/19 0730 12/07/19 0800  BP: 106/89  123/83   Pulse: (!) 112  (!) 108 (!) 108  Resp: (!) 48  18 (!) 23  Temp:      TempSrc:      SpO2: 98% 100% 99% 99%  Weight:      Height:       CBC:  Recent Labs  Lab 12/06/19 0422 12/06/19 0428 12/07/19 0519 12/07/19 0519 12/07/19 0714 12/07/19 0906  WBC 11.6*  --  20.6*  --   --   --   NEUTROABS 6.7  --   --   --   --   --   HGB 14.7   < > 13.4   < > 12.9* 12.9*  HCT 45.9   < > 42.3   < > 38.0* 38.0*  MCV 85.6  --  85.8  --   --   --   PLT 351  --  323  --   --   --    < > = values in this interval not displayed.   Basic Metabolic Panel:  Recent Labs  Lab 12/06/19 0422 12/06/19 0422 12/06/19 0428 12/06/19 0428 12/06/19 1203 12/06/19 1518 12/07/19 0519 12/07/19 0519 12/07/19 0714 12/07/19 0906  NA 140   < > 142   < > 140   < > 147*   < > 153* 151*  K 3.2*   < > 2.9*   < >  --   --  4.2   < > 4.1 4.1  CL 105   < > 102  --   --   --  120*  --   --   --   CO2 23  --   --   --   --   --  17*  --   --   --   GLUCOSE 183*   < > 183*  --   --   --  137*  --   --   --   BUN 14   < > 15  --   --   --  12  --   --    --   CREATININE 1.52*   < > 1.40*  --   --   --  1.44*  --   --   --   CALCIUM 8.9  --   --   --   --   --  8.1*  --   --   --   MG  --   --   --   --  1.9  --   --   --   --   --    < > = values in this interval not displayed.   Lipid Panel:  Component Value Date/Time   CHOL 232 (H) 12/06/2019 1203   TRIG 126 12/07/2019 0519   HDL 42 12/06/2019 1203   CHOLHDL 5.5 12/06/2019 1203   VLDL UNABLE TO CALCULATE IF TRIGLYCERIDE OVER 400 mg/dL 12/06/2019 1203   LDLCALC UNABLE TO CALCULATE IF TRIGLYCERIDE OVER 400 mg/dL 12/06/2019 1203   HgbA1c:  Lab Results  Component Value Date   HGBA1C 5.6 12/06/2019   Urine Drug Screen:     Component Value Date/Time   LABOPIA NONE DETECTED 12/06/2019 1200   COCAINSCRNUR NONE DETECTED 12/06/2019 1200   LABBENZ NONE DETECTED 12/06/2019 1200   AMPHETMU NONE DETECTED 12/06/2019 1200   THCU POSITIVE (A) 12/06/2019 1200   LABBARB NONE DETECTED 12/06/2019 1200    Alcohol Level     Component Value Date/Time   ETH <10 12/06/2019 1203    IMAGING past 24 hours CT HEAD WO CONTRAST  Result Date: 12/07/2019 CLINICAL DATA:  Intracranial hemorrhage. EXAM: CT HEAD WITHOUT CONTRAST TECHNIQUE: Contiguous axial images were obtained from the base of the skull through the vertex without intravenous contrast. COMPARISON:  CT head without contrast 12/06/2019 CTA of the head 12/06/2019 FINDINGS: Brain: Right thalamic hemorrhage is stable in size. Surrounding edema is slightly more evident, as expected intraventricular hemorrhage is again noted. Blood is predominantly seen in the right lateral ventricle. Hemorrhage can be seen in the third and fourth ventricles as well. Blood is noted in the inferior fourth ventricle foramina. Ventricles are slightly more prominent than on the prior exams. Transverse measurement of the frontal horns is 31 mm compared with 29 mm. 4-5 mm leftward shift is stable. Vascular: No hyperdense vessel or unexpected calcification. Skull: Calvarium  is intact. No focal lytic or blastic lesions are present. No significant extracranial soft tissue lesion is present. Sinuses/Orbits: The paranasal sinuses and mastoid air cells are clear. The globes and orbits are within normal limits. IMPRESSION: 1. Stable size of right thalamic hemorrhage with slightly more surrounding edema. Normal expected evolution of the hemorrhage. 2. Stable intraventricular hemorrhage. 3. Slight increase in size of ventricles. 4. Stable 4-5 mm leftward shift. Electronically Signed   By: San Morelle M.D.   On: 12/07/2019 04:40   CT HEAD WO CONTRAST  Result Date: 12/06/2019 CLINICAL DATA:  Intracranial hemorrhage, follow-up EXAM: CT HEAD WITHOUT CONTRAST TECHNIQUE: Contiguous axial images were obtained from the base of the skull through the vertex without intravenous contrast. COMPARISON:  Earlier same day FINDINGS: Brain: No significant change in size or appearance of parenchymal hemorrhage centered within the right thalamus and adjacent white matter. Similar mild surrounding edema and regional mass effect. Intraventricular extension is again noted. Lateral ventricles remain mildly prominent. No new hemorrhage or loss of gray-white differentiation. Vascular: No new findings. Skull: Unremarkable Sinuses/Orbits: No acute abnormality. Other: None. IMPRESSION: Similar parenchymal hemorrhage centered within the right thalamus with intraventricular extension. Stable mild prominence of the lateral ventricles. No new hemorrhage or worsening mass effect. Electronically Signed   By: Macy Mis M.D.   On: 12/06/2019 16:53   Portable Chest x-ray  Result Date: 12/07/2019 CLINICAL DATA:  Intubation. EXAM: PORTABLE CHEST 1 VIEW COMPARISON:  12/07/2019. FINDINGS: Endotracheal tube 3 cm above the carina. Left IJ line noted with tip at cavoatrial junction. NG tube noted with tip in the upper most portion of the stomach. Heart size stable. Persistent bilateral pulmonary infiltrates most  prominent throughout the left lung. Similar findings noted on prior exam. Small left pleural effusion cannot be excluded. No pneumothorax. No acute bony abnormality  identified. IMPRESSION: 1.  Lines and tubes in good anatomic position. 2. Persistent bilateral pulmonary infiltrates most prominent throughout the left lung. Similar findings on prior exam. Small left pleural effusion cannot be excluded. Electronically Signed   By: Marcello Moores  Register   On: 12/07/2019 08:20   DG CHEST PORT 1 VIEW  Result Date: 12/07/2019 CLINICAL DATA:  Shortness of breath. EXAM: PORTABLE CHEST 1 VIEW COMPARISON:  One-view chest x-ray 12/06/2019. FINDINGS: Heart is enlarged. Progressive interstitial and airspace disease is evident, left greater than right. Left pleural effusion is new. IMPRESSION: 1. Progressive interstitial and airspace disease, left greater than right. Findings are concerning for asymmetric neurogenic edema versus pneumonia. 2. New left pleural effusion. Electronically Signed   By: San Morelle M.D.   On: 12/07/2019 07:36    PHYSICAL EXAM   Pleasant young African-American male who is sedated and intubated. . Afebrile. Head is nontraumatic. Neck is supple without bruit.    Cardiac exam no murmur or gallop. Lungs are clear to auscultation. Distal pulses are well felt. Neurological Exam :  Patient is sedated and intubated on ventilatory support.  Eyes are slightly disconjugate with right eye exotropia mild.  Extraocular movements are almost full range though there is slight restriction of left lateral gaze.  T fundi not visualized left lower facial weakness.  Tongue midline.     Dense left hemiplegia 0/5 strength with brisk withdrawal in the lower extremity and trace withdrawal in the left upper extremity.  Normal antigravity strength on the right.  Gait not tested.  Sensation is diminished on the left compared to the right.  Left plantar upgoing left downgoing.  ASSESSMENT/PLAN Mr. Berman Grainger is a  39 y.o. male with recent HTN at dentist office with plans for PCP visit next week, presenting post coitus with L sided weakness, elevated BP.   Stroke:   Hypertensive R thalamic ICH w/ IVH New onset respiratory failure likely secondary to aspiration pneumonia.  Code Stroke CT head R thalamic hemorrhage, 39m. IVH. Local mass effect w/o midline shift  CTA head Unremarkable   CT head at 12h stable  CT head 6/22 0440 stable R thalamic hemorrhage, IVH. Sllight increased in ventricle size. 4-5 midline shift  CT head 6/22 remains stable     MRI  pending    Carotid Doppler  pending    2D Echo EF 60-65%. No source of embolus    LDL UTC d/t high TG, direct LDL 157  HgbA1c 5.6   SCDs for VTE prophylaxis  No antithrombotic prior to admission, now on No antithrombotic given hemorrhage   Therapy recommendations:  pending   Disposition:  pending   Bedrest for now  Cerebral Edema   On 3% protocol  3% at 75h  NA 151-153-151-150  Goal 150-155  Repeat CT stable  Hypoxic Respiratory Failure Aspiration PNA  Emergently intubated 6/22 am  CT chest L consolidation   Bronchoscopy, BAL to LLL  TM 101.8  WBC 20.6  Unasyn 6/22>>    CCM onboard   Hypertensive Emergency w/ Tachycardia  BP as high as 219/162   Home meds:  None - has PCP appt June 29th - referred to PCP by Dentist who noted high BP  SBP goal < 140  On arrival BP difficult to control. Received cleviprex up to 32, hydralazine and labetolol prns  Changed Cleviprex to cardene with good resuls  Added clonidine patch 0.158m  Long-term BP goal normotensive  Hyperlipidemia  Home meds: none   LDL 157  Hold statin in setting of acute hemorrhage   Consider statin at d/c or f/u appt  Dysphagia  Secondary to stroke  NPO  Speech on board  Other Stroke Risk Factors  ETOH use, advised to drink no more than 2 drink(s) a day  Substance abuse - UDS:  THC Positive. Patient advised to stop using due  to stroke risk.  Other Active Problems  Hypokalemia 3.2-2.9-4.1 - replace (Mag ok)   AKI Cre 1.5-1.4-1.44. on IVF  Hospital day # 1  Continue ventilatory support and treatment for presumed aspiration pneumonia as per critical care team.  Continue strict blood pressure control with systolic blood pressure goal below 160.  Long discussion at the bedside with the patient's wife and answered questions.  Discussed with Dr. Valeta Harms  This patient is critically ill and at significant risk of neurological worsening, death and care requires constant monitoring of vital signs, hemodynamics,respiratory and cardiac monitoring, extensive review of multiple databases, frequent neurological assessment, discussion with family, other specialists and medical decision making of high complexity.I have made any additions or clarifications directly to the above note.This critical care time does not reflect procedure time, or teaching time or supervisory time of PA/NP/Med Resident etc but could involve care discussion time.  I spent 30 minutes of neurocritical care time  in the care of  this patient.   Antony Contras, MD  To contact Stroke Continuity provider, please refer to [email or web address] After hours, contact General Neurology

## 2019-12-07 NOTE — Procedures (Signed)
Intubation Procedure Note Dennis Mercado [id number] [birth year]  Procedure: Intubation Indications: Airway protection and maintenance  Procedure Details Consent: Risks of procedure as well as the alternatives and risks of each were explained to the (patient/caregiver).  Consent for procedure obtained. Time Out: Verified patient identification, verified procedure, site/side was marked, verified correct patient position, special equipment/implants available, medications/allergies/relevent history reviewed, required imaging and test results available.  Performed  MAC and 4 Medications:  Fentanyl 100 mcg Etomidate 20 mg Versed 2 mg NMB vecuronium 10 mg    Evaluation Hemodynamic Status: BP stable throughout; O2 sats: stable throughout Patient's Current Condition: stable Complications: No apparent complications Patient did tolerate procedure well. Chest X-ray ordered to verify placement.  CXR: tube position acceptable.   [name] [name] ACNP Maryanna Shape PCCM Pager [phone] till 3 pm If no answer page [phone] 12/07/2019, 8:15 AM

## 2019-12-07 NOTE — Procedures (Signed)
Bronchoscopy Procedure Note Sharbel Sahagun [id number] 1980-09-26  Procedure: Bronchoscopy Indications: Obtain specimens for culture and/or other diagnostic studies  Procedure Details Consent: Risks of procedure as well as the alternatives and risks of each were explained to the (patient/caregiver).  Consent for procedure obtained. Time Out: Verified patient identification, verified procedure, site/side was marked, verified correct patient position, special equipment/implants available, medications/allergies/relevent history reviewed, required imaging and test results available.  Performed  In preparation for procedure, patient was given 100% FiO2 and bronchoscope lubricated. Sedation: propofol and fentanyl   Airway entered and the following bronchi were examined: RUL, RML, RLL, LUL, LLL and Bronchi.   There were diffuse clear/white frothy secretions, worsen in the LLL The LLL was therapeutically aspirated clear of any secretions, pus and aspirated debris Procedures performed: BAL to the LLL Bronchoscope removed.    Evaluation Hemodynamic Status: BP stable throughout; O2 sats: stable throughout Patient's Current Condition: stable Specimens:  Sent clear/white fluid Complications: No apparent complications Patient did tolerate procedure well.   Octavio Graves Braxxton Stoudt 12/07/2019

## 2019-12-07 NOTE — Progress Notes (Signed)
Carotid artery duplex completed. Refer to "CV Proc" under chart review to view preliminary results.  12/07/2019 4:01 PM Kelby Aline., MHA, RVT, RDCS, RDMS

## 2019-12-07 NOTE — Progress Notes (Signed)
Pt agitated throughout the night with complaints to wanting to walk and have something to drink. Eventually he began to attempt to get out of the bed and pull himself out, a waist restraint was then ordered to maintain safety.   Pt was increasingly agitated and was tachypneic and was starting to desaturate. I increased his 02 from 2L to 4L and eventually moving to 5L and he was still maintaining in the mid 80's-90's. I notified neurology of the changes.   Pt was still A/Ox4 but was more delirious  I placed a non-rebreather on the patient to increase is oxygenation and neurology was present on the unit. CCM was then consulted and intubated the patient. Please see neurology and CCM note.  Leticia Clas, RN BSN, ME

## 2019-12-07 NOTE — Progress Notes (Signed)
PT Cancellation Note  Patient Details Name: Dennis Mercado MRN: [id number] DOB: Sep 04, 1980   Cancelled Treatment:    Reason Eval/Treat Not Completed: Medical issues which prohibited therapy; checked with RN who reports pt not ready for PT.  Will attempt another day.   Reginia Naas 12/07/2019, 9:38 AM  Magda Kiel, PT Acute Rehabilitation Services JEADG:[phone] Office:[phone] 12/07/2019

## 2019-12-07 NOTE — Consult Note (Addendum)
NAME:  Dennis Mercado, MRN:  [id number], DOB:  04-25-1981, LOS: 1 ADMISSION DATE:  12/06/2019, CONSULTATION DATE: 12/07/2018 REFERRING MD: Neurology, CHIEF COMPLAINT: Status post intracerebral hemorrhage, hypoxia respiratory distress.  Brief History   39 year old with acute onset of left-sided weakness right thalami bleed admitted to the hospital for further evaluation and treatment.  History of present illness   39 year old male following intercourse with his wife felt ill and had left-sided weakness.  Brought to the hospital found to have a right thalami bleed which time neurology admitted him to the [facility] intensive care unit.  His mental status declined his respiratory status declined pulmonary critical care was called to bedside and found him tachypneic agitated and hypoxic.  He was electively intubated after discussions with him so would be able to travel to CT scan safely.  He also had a central line placed for 3% saline.  He may need interventricular drain plus or minus surgical interventions per neurosurgery.  Pulmonary critical care asked to consult he was intubated central line placed he is hemodynamically stable and being transferred to x-ray for further treatment evaluation.  Past Medical History  Noncompliant with medications hypertension  Significant Hospital Events   Intubated 12/07/2019  Consults:  12/07/2019 neurosurgery 12/07/2019 CCM  Procedures:  12/07/2019 intubation 12/07/2018 with left internal jugular central line placed  Significant Diagnostic Tests:  CT of the head with right ophthalmic bleeding  Micro Data:  6/22 fob>>  Antimicrobials:  6/22 unasyn>>  Interim history/subjective:  39 year old male, pulmonary critical care called to bedside for increasing respiratory distress hypoxia electively intubated and central line placed in anticipation of 3% saline going to  radiology for further evaluation and treatment  Objective   Blood pressure 123/83, pulse (!)  108, temperature 99.9 F (37.7 C), temperature source Oral, resp. rate (!) 23, height 5\' 7"  (1.702 m), weight 75.1 kg, SpO2 99 %.    Vent Mode: PRVC FiO2 (%):  [100 %] 100 % Set Rate:  [18 bmp] 18 bmp Vt Set:  [520 mL] 520 mL PEEP:  [5 cmH20] 5 cmH20 Plateau Pressure:  [21 cmH20] 21 cmH20   Intake/Output Summary (Last 24 hours) at 12/07/2019 0813 Last data filed at 12/07/2019 0700 Gross per 24 hour  Intake 4757.18 ml  Output 450 ml  Net 4307.18 ml   Filed Weights   12/06/19 0455 12/06/19 0940  Weight: 75.5 kg 75.1 kg    Examination: General: Well-nourished well-developed African-American male with obvious left-sided weakness agitation and respiratory distress prior to intubation HENT: Currently with endotracheal tube in place gastric tube in place pupils are equal and reactive. Lungs: Decreased breath sounds in the bases Cardiovascular: Heart sounds are regular regular rate and rhythm Abdomen: Soft nontender positive bowel sounds Extremities: Warm dry Neuro: Obvious left-sided weakness, agitation prior to intubation able to follow commands and interact appropriately GU: Condom cath is in place with yellow urine  Resolved Hospital Problem list     Assessment & Plan:  Ventilator dependent respiratory failure and hypoxia in the setting of intracranial hemorrhage and tachypnea.  Judgment made to proceed with intubation for airway protection prior to going to CT scan. Vent bundle Follow chest x-ray Follow-up arterial blood gas left on 100% due to low PO2 on nonrebreather Pulmonary toilet   Suspected aspiration pna Empirical abx FOB  Acute right thalamus intracranial hemorrhage on 12/07/2019 Neurology and neurosurgery following Repeat CT of the head May need interventricular drain placed per neurosurgery 3% saline has been initiated He has been intubated  and sedated for adequate blood pressure control  Poorly controlled hypertension Medications in place to keep  arrangement and neurology's requirements  Best practice:  Diet: NPO Pain/Anxiety/Delirium protocol (if indicated): CCM pain and agitation management scale VAP protocol (if indicated): Employed DVT prophylaxis: Compression hose only due to intracranial hemorrhage GI prophylaxis: PPI Glucose control: Follows callus protocol if needed Mobility: Bedrest Code Status: Full Family Communication: Wife updated bedside 12/07/2019 Disposition: Neurosurgical intensive care unit  Labs   CBC: Recent Labs  Lab 12/06/19 0422 12/06/19 0428 12/07/19 0519 12/07/19 0714  WBC 11.6*  --  20.6*  --   NEUTROABS 6.7  --   --   --   HGB 14.7 15.3 13.4 12.9*  HCT 45.9 45.0 42.3 38.0*  MCV 85.6  --  85.8  --   PLT 351  --  323  --     Basic Metabolic Panel: Recent Labs  Lab 12/06/19 0422 12/06/19 0422 12/06/19 0428 12/06/19 0428 12/06/19 1203 12/06/19 1518 12/06/19 2206 12/07/19 0519 12/07/19 0714  NA 140   < > 142   < > 140 141 146* 147* 153*  K 3.2*  --  2.9*  --   --   --   --  4.2 4.1  CL 105  --  102  --   --   --   --  120*  --   CO2 23  --   --   --   --   --   --  17*  --   GLUCOSE 183*  --  183*  --   --   --   --  137*  --   BUN 14  --  15  --   --   --   --  12  --   CREATININE 1.52*  --  1.40*  --   --   --   --  1.44*  --   CALCIUM 8.9  --   --   --   --   --   --  8.1*  --   MG  --   --   --   --  1.9  --   --   --   --    < > = values in this interval not displayed.   GFR: Estimated Creatinine Clearance: 65 mL/min (A) (by C-G formula based on SCr of 1.44 mg/dL (H)). Recent Labs  Lab 12/06/19 0422 12/07/19 0519  WBC 11.6* 20.6*    Liver Function Tests: Recent Labs  Lab 12/06/19 0422  AST 25  ALT 22  ALKPHOS 57  BILITOT 0.5  PROT 6.9  ALBUMIN 4.1   No results for input(s): LIPASE, AMYLASE in the last 168 hours. No results for input(s): AMMONIA in the last 168 hours.  ABG    Component Value Date/Time   PHART 7.426 12/07/2019 0714   PCO2ART 26.8 (L)  12/07/2019 0714   PO2ART 64 (L) 12/07/2019 0714   HCO3 17.6 (L) 12/07/2019 0714   TCO2 18 (L) 12/07/2019 0714   ACIDBASEDEF 5.0 (H) 12/07/2019 0714   O2SAT 93.0 12/07/2019 0714     Coagulation Profile: Recent Labs  Lab 12/06/19 0422  INR 0.9    Cardiac Enzymes: No results for input(s): CKTOTAL, CKMB, CKMBINDEX, TROPONINI in the last 168 hours.  HbA1C: Hgb A1c MFr Bld  Date/Time Value Ref Range Status  12/06/2019 12:03 PM 5.6 4.8 - 5.6 % Final    Comment:    (NOTE)  Prediabetes: 5.7 - 6.4         Diabetes: >6.4         Glycemic control for adults with diabetes: <7.0     CBG: Recent Labs  Lab 12/06/19 0415  GLUCAP 158*    Review of Systems:   NA.  Known history of hypertension with noncompliant with medications  Past Medical History  He,  has no past medical history on file.   Surgical History   No past surgical history on file.   Social History   reports that he has never smoked. He has never used smokeless tobacco. He reports current alcohol use. He reports that he does not use drugs.   Family History   His family history is not on file.   Allergies No Known Allergies   Home Medications  Prior to Admission medications   Not on File     Critical care time: 32 min    [name] Minor ACNP Acute Care Nurse Practitioner [place] Please consult Amion 12/07/2019, 8:13 AM    PCCM:  39 yo, ICH following intercourse. Now with LLL consolidation. Likely asp pna. PCCM consulted for respiratory failure.   BP 123/83   Pulse (!) 108   Temp 99.9 F (37.7 C) (Oral)   Resp (!) 23   Ht 5\' 7"  (1.702 m)   Wt 75.1 kg   SpO2 99%   BMI 25.93 kg/m   Gen: young male, intubated, on MV  HENT: NCAT, no tracking, blink to attack  Heart: RRR, s1 s2  Lungs: BL vented breaths   Labs reviewed  Head CT reviewed  Case discussed with NeuroSx and Neurology   A:  AHRF Intubated now on MV  LLL aspiration pneumonia  HTN   P: 3% HS  infusion  BL control  CT head reviewed with NeuroSx  Unasyn for PNA  Bronch cultures pending   This patient is critically ill with multiple organ system failure; which, requires frequent high complexity decision making, assessment, support, evaluation, and titration of therapies. This was completed through the application of advanced monitoring technologies and extensive interpretation of multiple databases. During this encounter critical care time was devoted to patient care services described in this note for 32 minutes.   Garner Nash, DO [facility] 12/07/2019 3:56 PM

## 2019-12-07 NOTE — Progress Notes (Signed)
  Echocardiogram 2D Echocardiogram has been performed.  Dennis Mercado 12/07/2019, 3:26 PM

## 2019-12-07 NOTE — Procedures (Signed)
Central Venous Catheter Insertion Procedure Note Giovani Neumeister [id number] January 02, 1981  Procedure: Insertion of Central Venous Catheter Indications: Assessment of intravascular volume, Drug and/or fluid administration and Frequent blood sampling  Procedure Details Consent: Risks of procedure as well as the alternatives and risks of each were explained to the (patient/caregiver).  Consent for procedure obtained. and Unable to obtain consent because of emergent medical necessity. Time Out: Verified patient identification, verified procedure, site/side was marked, verified correct patient position, special equipment/implants available, medications/allergies/relevent history reviewed, required imaging and test results available.  Performed  Maximum sterile technique was used including antiseptics, cap, gloves, gown, hand hygiene, mask and sheet. Skin prep: Chlorhexidine; local anesthetic administered A antimicrobial bonded/coated triple lumen catheter was placed in the left internal jugular vein using the Seldinger technique. Ultrasound guidance used.Yes.   Catheter placed to 20 cm. Blood aspirated via all 3 ports and then flushed x 3. Line sutured x 2 and dressing applied.  Evaluation Blood flow good Complications: No apparent complications Patient did tolerate procedure well. Chest X-ray ordered to verify placement.  CXR: good line placement.  [name] Lajuan Kovaleski ACNP Acute Care Nurse Practitioner [place] Please consult Amion 12/07/2019, 8:18 AM

## 2019-12-08 ENCOUNTER — Inpatient Hospital Stay (HOSPITAL_COMMUNITY): Payer: BC Managed Care – PPO

## 2019-12-08 LAB — CBC WITH DIFFERENTIAL/PLATELET
Abs Immature Granulocytes: 0.05 10*3/uL (ref 0.00–0.07)
Basophils Absolute: 0.1 10*3/uL (ref 0.0–0.1)
Basophils Relative: 1 %
Eosinophils Absolute: 0 10*3/uL (ref 0.0–0.5)
Eosinophils Relative: 0 %
HCT: 41.8 % (ref 39.0–52.0)
Hemoglobin: 12.4 g/dL — ABNORMAL LOW (ref 13.0–17.0)
Immature Granulocytes: 0 %
Lymphocytes Relative: 11 %
Lymphs Abs: 1.4 10*3/uL (ref 0.7–4.0)
MCH: 27.2 pg (ref 26.0–34.0)
MCHC: 29.7 g/dL — ABNORMAL LOW (ref 30.0–36.0)
MCV: 91.7 fL (ref 80.0–100.0)
Monocytes Absolute: 0.9 10*3/uL (ref 0.1–1.0)
Monocytes Relative: 7 %
Neutro Abs: 10.4 10*3/uL — ABNORMAL HIGH (ref 1.7–7.7)
Neutrophils Relative %: 81 %
Platelets: 265 10*3/uL (ref 150–400)
RBC: 4.56 MIL/uL (ref 4.22–5.81)
RDW: 16.5 % — ABNORMAL HIGH (ref 11.5–15.5)
WBC: 12.8 10*3/uL — ABNORMAL HIGH (ref 4.0–10.5)
nRBC: 0.2 % (ref 0.0–0.2)

## 2019-12-08 LAB — BASIC METABOLIC PANEL
Anion gap: 6 (ref 5–15)
BUN: 13 mg/dL (ref 6–20)
CO2: 24 mmol/L (ref 22–32)
Calcium: 8.3 mg/dL — ABNORMAL LOW (ref 8.9–10.3)
Chloride: 126 mmol/L — ABNORMAL HIGH (ref 98–111)
Creatinine, Ser: 1.57 mg/dL — ABNORMAL HIGH (ref 0.61–1.24)
GFR calc Af Amer: >60 mL/min (ref >60)
GFR calc non Af Amer: 55 mL/min — ABNORMAL LOW (ref >60)
Glucose, Bld: 95 mg/dL (ref 70–99)
Potassium: 4.2 mmol/L (ref 3.5–5.1)
Sodium: 156 mmol/L — ABNORMAL HIGH (ref 135–145)

## 2019-12-08 LAB — POCT I-STAT 7, (LYTES, BLD GAS, ICA,H+H)
Acid-base deficit: 2 mmol/L (ref 0.0–2.0)
Bicarbonate: 24.4 mmol/L (ref 20.0–28.0)
Calcium, Ion: 1.24 mmol/L (ref 1.15–1.40)
HCT: 35 % — ABNORMAL LOW (ref 39.0–52.0)
Hemoglobin: 11.9 g/dL — ABNORMAL LOW (ref 13.0–17.0)
O2 Saturation: 99 %
Patient temperature: 37.6
Potassium: 4.2 mmol/L (ref 3.5–5.1)
Sodium: 160 mmol/L — ABNORMAL HIGH (ref 135–145)
TCO2: 26 mmol/L (ref 22–32)
pCO2 arterial: 48.1 mmHg — ABNORMAL HIGH (ref 32.0–48.0)
pH, Arterial: 7.316 — ABNORMAL LOW (ref 7.350–7.450)
pO2, Arterial: 146 mmHg — ABNORMAL HIGH (ref 83.0–108.0)

## 2019-12-08 LAB — MAGNESIUM
Magnesium: 2 mg/dL (ref 1.7–2.4)
Magnesium: 1.8 mg/dL (ref 1.7–2.4)

## 2019-12-08 LAB — PHOSPHORUS
Phosphorus: 3.3 mg/dL (ref 2.5–4.6)
Phosphorus: 3.4 mg/dL (ref 2.5–4.6)

## 2019-12-08 LAB — SODIUM
Sodium: 158 mmol/L — ABNORMAL HIGH (ref 135–145)
Sodium: 158 mmol/L — ABNORMAL HIGH (ref 135–145)
Sodium: 151 mmol/L — ABNORMAL HIGH (ref 135–145)

## 2019-12-08 LAB — GLUCOSE, CAPILLARY
Glucose-Capillary: 114 mg/dL — ABNORMAL HIGH (ref 70–99)
Glucose-Capillary: 135 mg/dL — ABNORMAL HIGH (ref 70–99)

## 2019-12-08 LAB — TROPONIN I (HIGH SENSITIVITY)
Troponin I (High Sensitivity): 300 ng/L (ref <18)
Troponin I (High Sensitivity): 279 ng/L (ref <18)

## 2019-12-08 MED ORDER — VITAL AF 1.2 CAL PO LIQD
1000.0000 mL | ORAL | Status: DC
  Administered 2019-12-08 – 2019-12-09 (×2): 1000 mL

## 2019-12-08 MED ORDER — HYDROCORTISONE (PERIANAL) 2.5 % EX CREA
1.0000 "application " | TOPICAL_CREAM | Freq: Three times a day (TID) | CUTANEOUS | Status: DC | PRN
  Filled 2019-12-08: qty 28.35

## 2019-12-08 MED ORDER — NICARDIPINE HCL IN NACL 40-0.83 MG/200ML-% IV SOLN
3.0000 mg/h | INTRAVENOUS | Status: DC
  Administered 2019-12-08: 15 mg/h via INTRAVENOUS
  Administered 2019-12-09: 10 mg/h via INTRAVENOUS
  Administered 2019-12-09: 7.5 mg/h via INTRAVENOUS
  Administered 2019-12-09: 5 mg/h via INTRAVENOUS
  Administered 2019-12-10 (×2): 10 mg/h via INTRAVENOUS
  Administered 2019-12-10: 3 mg/h via INTRAVENOUS
  Administered 2019-12-11: 10 mg/h via INTRAVENOUS
  Administered 2019-12-11 (×2): 12.5 mg/h via INTRAVENOUS
  Filled 2019-12-08 (×9): qty 200

## 2019-12-08 MED ORDER — HYDROCHLOROTHIAZIDE 25 MG PO TABS: 25.0000 mg | ORAL_TABLET | Freq: Every day | ORAL | Status: DC

## 2019-12-08 MED ORDER — HYDROCHLOROTHIAZIDE 25 MG PO TABS
25.0000 mg | ORAL_TABLET | Freq: Every day | ORAL | Status: DC
  Administered 2019-12-08 – 2019-12-10 (×3): 25 mg
  Filled 2019-12-08 (×3): qty 1

## 2019-12-08 MED ORDER — AMLODIPINE BESYLATE 5 MG PO TABS
5.0000 mg | ORAL_TABLET | Freq: Every day | ORAL | Status: DC
  Administered 2019-12-08: 5 mg
  Filled 2019-12-08: qty 1

## 2019-12-08 NOTE — Progress Notes (Signed)
STROKE TEAM PROGRESS NOTE   INTERVAL HISTORY Patient remains intubated for respiratory failure and aspiration pneumonia and is sedated with propofol drip and fentanyl.  He was quite agitated yesterday.  He had MRI scan of the brain today which showed stable appearance of the hemorrhage without any extension.  His wife is at the bedside.  Vital signs are stable. Vitals:   12/08/19 1000 12/08/19 1030 12/08/19 1209 12/08/19 1509  BP: (!) 160/105  (!) 161/113 (!) 164/92  Pulse: 90 72 68 (!) 122  Resp: _0 (!) 24  Temp: (!) 101.5 F (38.6 C) (!) 101.8 F (38.8 C) (!) 100.8 F (38.2 C) (!) 100.9 F (38.3 C)  TempSrc:      SpO2: 96% 96% 98% 97%  Weight:      Height:       CBC:  Recent Labs  Lab 12/06/19 0422 12/06/19 0428 12/07/19 0519 12/07/19 0714 12/08/19 0359 12/08/19 0518  WBC 11.6*   < > 20.6*  --   --  12.8*  NEUTROABS 6.7  --   --   --   --  10.4*  HGB 14.7   < > 13.4   < > 11.9* 12.4*  HCT 45.9   < > 42.3   < > 35.0* 41.8  MCV 85.6   < > 85.8  --   --  91.7  PLT 351   < > 323  --   --  265   < > = values in this interval not displayed.   Basic Metabolic Panel:  Recent Labs  Lab 12/06/19 1203 12/06/19 1518 12/07/19 0519 12/07/19 0714 12/08/19 0359 12/08/19 0359 12/08/19 0518 12/08/19 1024  NA 140   < > 147*   < > 160*   < > 156* 158*  K  --   --  4.2   < > 4.2  --  4.2  --   CL  --   --  120*  --   --   --  126*  --   CO2  --   --  17*  --   --   --  24  --   GLUCOSE  --   --  137*  --   --   --  95  --   BUN  --   --  12  --   --   --  13  --   CREATININE  --   --  1.44*  --   --   --  1.57*  --   CALCIUM  --   --  8.1*  --   --   --  8.3*  --   MG 1.9  --   --   --   --   --  2.0  --   PHOS  --   --   --   --   --   --  3.3  --    < > = values in this interval not displayed.   Lipid Panel:     Component Value Date/Time   CHOL 232 (H) 12/06/2019 1203   TRIG 126 12/07/2019 0519   HDL 42 12/06/2019 1203   CHOLHDL 5.5 12/06/2019 1203   VLDL UNABLE  TO CALCULATE IF TRIGLYCERIDE OVER 400 mg/dL 12/06/2019 1203   LDLCALC UNABLE TO CALCULATE IF TRIGLYCERIDE OVER 400 mg/dL 12/06/2019 1203   HgbA1c:  Lab Results  Component Value Date   HGBA1C 5.6 12/06/2019   Urine Drug Screen:  Component Value Date/Time   LABOPIA NONE DETECTED 12/06/2019 1200   COCAINSCRNUR NONE DETECTED 12/06/2019 1200   LABBENZ NONE DETECTED 12/06/2019 1200   AMPHETMU NONE DETECTED 12/06/2019 1200   THCU POSITIVE (A) 12/06/2019 1200   LABBARB NONE DETECTED 12/06/2019 1200    Alcohol Level     Component Value Date/Time   ETH <10 12/06/2019 1203    IMAGING past 24 hours MR BRAIN WO CONTRAST  Result Date: 12/08/2019 CLINICAL DATA:  Acute onset left-sided weakness.  Headache. EXAM: MRI HEAD WITHOUT CONTRAST TECHNIQUE: Multiplanar, multiecho pulse sequences of the brain and surrounding structures were obtained without intravenous contrast. COMPARISON:  None. FINDINGS: BRAIN: There is a large intraparenchymal hematoma within the right thalamus, unchanged in size compared to the earlier CT. Local mass effect is unchanged. There is mild surrounding edema. Blood extends into the lateral ventricles. There is no hydrocephalus or ventricular entrapment. There is no abnormal diffusion restriction. Normal volume of CSF spaces. There are approximately 5 scattered foci of chronic microhemorrhage. Normal midline structures. VASCULAR: Major flow voids are preserved. SKULL AND UPPER CERVICAL SPINE: Normal calvarium and skull base. Visualized upper cervical spine and soft tissues are normal. SINUSES/ORBITS: No paranasal sinus fluid levels or advanced mucosal thickening. No mastoid or middle ear effusion. Normal orbits. IMPRESSION: 1. Unchanged size of right thalamic intraparenchymal hematoma with mild surrounding edema. No hydrocephalus or ventricular entrapment. 2. 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 Electronically Signed   By: Ulyses Jarred M.D.   On: 12/08/2019 03:21   DG Chest Port 1 View  Result Date: 12/08/2019 CLINICAL DATA:  Endotracheal tube. EXAM: PORTABLE CHEST 1 VIEW COMPARISON:  December 07, 2019. FINDINGS: Stable cardiomediastinal silhouette. Endotracheal and nasogastric tubes are unchanged in position. Left internal jugular catheter is unchanged. No pneumothorax is noted. Stable large left lung opacity is noted concerning for pneumonia. Improved right basilar opacity is noted. No significant pleural effusion is noted. Bony thorax is unremarkable. IMPRESSION: Stable support apparatus. Stable large left lung opacity concerning for pneumonia. Improved right basilar opacity is noted. No significant pleural effusion is noted. Electronically Signed   By: Marijo Conception M.D.   On: 12/08/2019 08:34    PHYSICAL EXAM   Pleasant young African-American male who is sedated and intubated. . Afebrile. Head is nontraumatic. Neck is supple without bruit.    Cardiac exam no murmur or gallop. Lungs are clear to auscultation. Distal pulses are well felt. Neurological Exam :  Patient is sedated and intubated on ventilatory support.  Eyes are slightly dysconjugate with right eye exotropia mild.  Extraocular movements are almost full range though there is slight restriction of left lateral gaze.  T fundi not visualized left lower facial weakness.  Tongue midline.     Dense left hemiplegia 0/5 strength with brisk withdrawal in the lower extremity and trace withdrawal in the left upper extremity.  Normal antigravity strength on the right.  Gait not tested.  Sensation is diminished on the left compared to the right.  Left plantar upgoing left downgoing.  ASSESSMENT/PLAN [name] is a 39 y.o. male with recent HTN at dentist office with plans for PCP visit next week, presenting post coitus with L sided weakness, elevated BP.   Stroke:   Hypertensive R thalamic ICH w/ IVH New onset respiratory failure likely secondary to aspiration  pneumonia.  Code Stroke CT head R thalamic hemorrhage, 27m. IVH. Local mass effect w/o midline shift  CTA head Unremarkable   CT head at  12h stable  CT head 6/22 0440 stable R thalamic hemorrhage, IVH. Sllight increased in ventricle size. 4-5 midline shift  CT head 6/22 remains stable     MRI  pending    Carotid Doppler  pending    2D Echo EF 60-65%. No source of embolus    LDL UTC d/t high TG, direct LDL 157  HgbA1c 5.6   SCDs for VTE prophylaxis  No antithrombotic prior to admission, now on No antithrombotic given hemorrhage   Therapy recommendations:  pending   Disposition:  pending   Bedrest for now  Cerebral Edema   On 3% protocol  3% at 75h  NA 151-153-151-150  Goal 150-155  Repeat CT stable  Hypoxic Respiratory Failure Aspiration PNA  Emergently intubated 6/22 am  CT chest L consolidation   Bronchoscopy, BAL to LLL  TM 101.8  WBC 20.6  Unasyn 6/22>>    CCM onboard   Hypertensive Emergency w/ Tachycardia  BP as high as 219/162   Home meds:  None - has PCP appt June 29th - referred to PCP by Dentist who noted high BP  SBP goal < 140  On arrival BP difficult to control. Received cleviprex up to 32, hydralazine and labetolol prns  Changed Cleviprex to cardene with good resuls . Added clonidine patch 0.29m  . Long-term BP goal normotensive  Hyperlipidemia  Home meds: none   LDL 157  Hold statin in setting of acute hemorrhage   Consider statin at d/c or f/u appt  Dysphagia . Secondary to stroke . NPO . Speech on board  Other Stroke Risk Factors  ETOH use, advised to drink no more than 2 drink(s) a day  Substance abuse - UDS:  THC Positive. Patient advised to stop using due to stroke risk.  Other Active Problems  Hypokalemia 3.2-2.9-4.1 - replace (Mag ok)   AKI Cre 1.5-1.4-1.44. on IVF  Hospital day # 2  Continue ventilatory support and treatment for presumed aspiration pneumonia as per critical care team.   Continue strict blood pressure control with systolic blood pressure goal below 160.  Hopefully wean off ventilatory support in the next few days for respiratory failure and pneumonia and then extubate as tolerated.  Long discussion at the bedside with the patient's wife and answered questions.  Discussed with Dr. OJenetta Downerfrom critical care medicine and patient's wife at the bedside and answered questions This patient is critically ill and at significant risk of neurological worsening, death and care requires constant monitoring of vital signs, hemodynamics,respiratory and cardiac monitoring, extensive review of multiple databases, frequent neurological assessment, discussion with family, other specialists and medical decision making of high complexity.I have made any additions or clarifications directly to the above note.This critical care time does not reflect procedure time, or teaching time or supervisory time of PA/NP/Med Resident etc but could involve care discussion time.  I spent 30 minutes of neurocritical care time  in the care of  this patient.   PAntony Contras MD  To contact Stroke Continuity provider, please refer to [email or web address] After hours, contact General Neurology

## 2019-12-08 NOTE — Progress Notes (Signed)
Initial Nutrition Assessment  RD working remotely.  DOCUMENTATION CODES:   Not applicable  INTERVENTION:   Initiate tube feeding via OG tube: - Vital AF 1.2 @ 60 ml/hr (1440 ml/day)  Tube feeding regimen provides 1728 kcal, 108 grams of protein, and 1168 ml of H2O.   Tube feeding regimen and current propofol provides 2026 total kcal (95% of needs).  NUTRITION DIAGNOSIS:   Inadequate oral intake related to inability to eat as evidenced by NPO status.  GOAL:   Provide needs based on ASPEN/SCCM guidelines  MONITOR:   Vent status, Labs, Weight trends, TF tolerance  REASON FOR ASSESSMENT:   Ventilator, Consult Enteral/tube feeding initiation and management  ASSESSMENT:   39 year old male who presented on 6/21 as a Code Stroke. PMH of HTN. 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   6/22 - intubated, bronchoscopy  Received consult for tube feeding initiation and management.  Pt now with LLL aspiration pneumonia.  OG tube in place with tip in fundus of stomach per x-ray.  Patient is currently intubated on ventilator support MV: 9.3 L/min Temp (24hrs), Avg:100.2 F (37.9 C), Min:98.2 F (36.8 C), Max:101.8 F (38.8 C) BP (cuff): 150/105 MAP (cuff): 120  Drips: Propofol: 11.3 ml/hr (provides 298 kcal daily from lipid) Fentanyl: 15 ml/hr  Medications reviewed and include: colace, protonix, miralax, senokot, IV abx  Labs reviewed: sodium 156, creatinine 1.57  UOP: 3075 ml x 24 hours  NUTRITION - FOCUSED PHYSICAL EXAM:  Unable to complete at this time. RD working remotely.  Diet Order:   Diet Order            Diet NPO time specified  Diet effective now                 EDUCATION NEEDS:   No education needs have been identified at this time  Skin:  Skin Assessment: Reviewed RN Assessment  Last BM:  no documented BM  Height:   Ht Readings from Last 1 Encounters:  12/06/19 5\' 7"  (1.702 m)    Weight:   Wt Readings from  Last 1 Encounters:  12/06/19 75.1 kg    Ideal Body Weight:  67.3 kg  BMI:  Body mass index is 25.93 kg/m.  Estimated Nutritional Needs:   Kcal:  2123  Protein:  100-115 grams  Fluid:  >/= 2.0 L    Gaynell Face, MS, RD, LDN Inpatient Clinical Dietitian Pager: [phone] Weekend/After Hours: [phone]

## 2019-12-08 NOTE — Progress Notes (Signed)
SLP Cancellation Note  Patient Details Name: Kemp Gomes MRN: [id number] DOB: [birth year]   Cancelled treatment:        Remains intubated, propofol and not ready for ST services yet. Will follow.    Houston Siren 12/08/2019, 7:35 AM   Orbie Pyo Colvin Caroli.Ed Risk analyst [phone] Office [phone]

## 2019-12-08 NOTE — Progress Notes (Signed)
NAME:  Dennis Mercado, MRN:  [id number], DOB:  1980/09/20, LOS: 2 ADMISSION DATE:  12/06/2019, CONSULTATION DATE: 12/07/2018 REFERRING MD: Neurology, CHIEF COMPLAINT: Status post intracerebral hemorrhage, hypoxia respiratory distress.  Brief History   39 year old with acute onset of left-sided weakness right thalami bleed admitted to the hospital for further evaluation and treatment.  History of present illness   39 year old male following intercourse with his wife felt ill and had left-sided weakness.  Brought to the hospital found to have a right thalami bleed which time neurology admitted him to the [facility] intensive care unit.  His mental status declined his respiratory status declined pulmonary critical care was called to bedside and found him tachypneic agitated and hypoxic.  He was electively intubated after discussions with him so would be able to travel to CT scan safely.  He also had a central line placed for 3% saline.  He may need interventricular drain plus or minus surgical interventions per neurosurgery.  Pulmonary critical care asked to consult he was intubated central line placed he is hemodynamically stable and being transferred to x-ray for further treatment evaluation.  Past Medical History  Noncompliant with medications hypertension  Significant Hospital Events   Intubated 12/07/2019  Consults:  12/07/2019 neurosurgery 12/07/2019 CCM  Procedures:  12/07/2019 intubation 12/07/2018 with left internal jugular central line placed  Significant Diagnostic Tests:  CT of the head with right ophthalmic bleeding  Micro Data:  6/22 fob>>  Antimicrobials:  6/22 unasyn>>  Interim history/subjective:   Intubated. In ICU, on vent in bed   Objective   Blood pressure (!) 144/104, pulse 92, temperature (!) 100.6 F (38.1 C), resp. rate 18, height 5\' 7"  (1.702 m), weight 75.1 kg, SpO2 98 %.    Vent Mode: PRVC FiO2 (%):  [50 %-100 %] 50 % Set Rate:  [18 bmp] 18 bmp Vt Set:  [520  mL] 520 mL PEEP:  [5 cmH20] 5 cmH20 Plateau Pressure:  [14 cmH20-22 cmH20] 22 cmH20   Intake/Output Summary (Last 24 hours) at 12/08/2019 6387 Last data filed at 12/08/2019 0700 Gross per 24 hour  Intake 2562.17 ml  Output 3075 ml  Net -512.83 ml   Filed Weights   12/06/19 0455 12/06/19 0940  Weight: 75.5 kg 75.1 kg    Examination: General: young male, on life support  HENT: ETT in place, pupils reactive  Lungs: diminished BL, BL vented breath sounds  Cardiovascular: RRR s1 s2  Abdomen: soft nt nd  Extremities: no edema  Neuro: sedate this morning on vent, not following commands for me this mornign  GU: condom cath   Resolved Hospital Problem list     Assessment & Plan:   Acute Hypoxemic Respiratory Failure, LLL Aspiration pneumonia, dense consolidation  - adult vent protocol - weaning as tolerated  - wean peep and foi2 - sedation with PAD guidelines  - propofol and fentanyl   Suspected aspiration pna - unasyn X 7 days  - follow up bronch cx   Acute right thalamus intracranial hemorrhage on 12/07/2019 - CT head stable  - MRI brain stable  - remains on 3% HS per neurology   Poorly controlled hypertension - no prior PTA meds   Best practice:  Diet: NPO Pain/Anxiety/Delirium protocol (if indicated): CCM pain and agitation management scale VAP protocol (if indicated): Employed DVT prophylaxis: Compression hose only due to intracranial hemorrhage GI prophylaxis: PPI Glucose control: Follows callus protocol if needed Mobility: Bedrest Code Status: Full Family Communication: wife updated 12/08/2019  Disposition: Neurosurgical intensive care  unit  Labs   CBC: Recent Labs  Lab 12/06/19 0422 12/06/19 0428 12/07/19 0519 12/07/19 0714 12/07/19 0906 12/08/19 0359 12/08/19 0518  WBC 11.6*  --  20.6*  --   --   --  12.8*  NEUTROABS 6.7  --   --   --   --   --  10.4*  HGB 14.7   < > 13.4 12.9* 12.9* 11.9* 12.4*  HCT 45.9   < > 42.3 38.0* 38.0* 35.0* 41.8  MCV  85.6  --  85.8  --   --   --  91.7  PLT 351  --  323  --   --   --  265   < > = values in this interval not displayed.    Basic Metabolic Panel: Recent Labs  Lab 12/06/19 0422 12/06/19 0422 12/06/19 0428 12/06/19 0428 12/06/19 1203 12/06/19 1518 12/07/19 0519 12/07/19 0519 12/07/19 0714 12/07/19 0714 12/07/19 0906 12/07/19 0906 12/07/19 1110 12/07/19 1758 12/08/19 0001 12/08/19 0359 12/08/19 0518  NA 140   < > 142   < > 140   < > 147*   < > 153*   < > 151*   < > 150* 155* 158* 160* 156*  K 3.2*   < > 2.9*   < >  --   --  4.2  --  4.1  --  4.1  --   --   --   --  4.2 4.2  CL 105  --  102  --   --   --  120*  --   --   --   --   --   --   --   --   --  126*  CO2 23  --   --   --   --   --  17*  --   --   --   --   --   --   --   --   --  24  GLUCOSE 183*  --  183*  --   --   --  137*  --   --   --   --   --   --   --   --   --  95  BUN 14  --  15  --   --   --  12  --   --   --   --   --   --   --   --   --  13  CREATININE 1.52*  --  1.40*  --   --   --  1.44*  --   --   --   --   --   --   --   --   --  1.57*  CALCIUM 8.9  --   --   --   --   --  8.1*  --   --   --   --   --   --   --   --   --  8.3*  MG  --   --   --   --  1.9  --   --   --   --   --   --   --   --   --   --   --  2.0  PHOS  --   --   --   --   --   --   --   --   --   --   --   --   --   --   --   --  3.3   < > = values in this interval not displayed.   GFR: Estimated Creatinine Clearance: 59.6 mL/min (A) (by C-G formula based on SCr of 1.57 mg/dL (H)). Recent Labs  Lab 12/06/19 0422 12/07/19 0519 12/08/19 0518  WBC 11.6* 20.6* 12.8*    Liver Function Tests: Recent Labs  Lab 12/06/19 0422  AST 25  ALT 22  ALKPHOS 57  BILITOT 0.5  PROT 6.9  ALBUMIN 4.1   No results for input(s): LIPASE, AMYLASE in the last 168 hours. No results for input(s): AMMONIA in the last 168 hours.  ABG    Component Value Date/Time   PHART 7.316 (L) 12/08/2019 0359   PCO2ART 48.1 (H) 12/08/2019 0359   PO2ART  146 (H) 12/08/2019 0359   HCO3 24.4 12/08/2019 0359   TCO2 26 12/08/2019 0359   ACIDBASEDEF 2.0 12/08/2019 0359   O2SAT 99.0 12/08/2019 0359     Coagulation Profile: Recent Labs  Lab 12/06/19 0422  INR 0.9    Cardiac Enzymes: No results for input(s): CKTOTAL, CKMB, CKMBINDEX, TROPONINI in the last 168 hours.  HbA1C: Hgb A1c MFr Bld  Date/Time Value Ref Range Status  12/06/2019 12:03 PM 5.6 4.8 - 5.6 % Final    Comment:    (NOTE)         Prediabetes: 5.7 - 6.4         Diabetes: >6.4         Glycemic control for adults with diabetes: <7.0     CBG: Recent Labs  Lab 12/06/19 0415  GLUCAP 158*    This patient is critically ill with multiple organ system failure; which, requires frequent high complexity decision making, assessment, support, evaluation, and titration of therapies. This was completed through the application of advanced monitoring technologies and extensive interpretation of multiple databases. During this encounter critical care time was devoted to patient care services described in this note for 31 minutes.  Garner Nash, DO [facility] 12/08/2019 9:25 AM

## 2019-12-08 NOTE — Progress Notes (Signed)
Change based on ABG result

## 2019-12-08 NOTE — Progress Notes (Signed)
OT Cancellation Note  Patient Details Name: Dennis Mercado MRN: [id number] DOB: 11/06/1980   Cancelled Treatment:    Reason Eval/Treat Not Completed: Active bedrest order  [facility] 12/08/2019, 6:29 AM  Maurie Boettcher, OT/L   Acute OT Clinical Specialist Acute Rehabilitation Services Pager [phone] Office [phone]

## 2019-12-08 NOTE — Progress Notes (Signed)
Pt transported  from MRI to 4N 19 w/o event.  Rt will continue to monitor.

## 2019-12-08 NOTE — Progress Notes (Signed)
PT Cancellation Note  Patient Details Name: Dennis Mercado MRN: [id number] DOB: 1980-08-24   Cancelled Treatment:    Reason Eval/Treat Not Completed: Active bedrest order. Pt remains on bedrest today. Per RN, hope to extubate pt in the next couple of days. Acute PT to return as able, as appropriate to complete PT eval.  [name], PT, DPT Acute Rehabilitation Services Pager #: [phone] Office #: [phone]    [name] 12/08/2019, 12:21 PM

## 2019-12-08 NOTE — Progress Notes (Signed)
Pt being transported to MRI at this time.

## 2019-12-09 LAB — SODIUM
Sodium: 153 mmol/L — ABNORMAL HIGH (ref 135–145)
Sodium: 151 mmol/L — ABNORMAL HIGH (ref 135–145)
Sodium: 153 mmol/L — ABNORMAL HIGH (ref 135–145)
Sodium: 155 mmol/L — ABNORMAL HIGH (ref 135–145)

## 2019-12-09 LAB — GLUCOSE, CAPILLARY
Glucose-Capillary: 125 mg/dL — ABNORMAL HIGH (ref 70–99)
Glucose-Capillary: 122 mg/dL — ABNORMAL HIGH (ref 70–99)
Glucose-Capillary: 115 mg/dL — ABNORMAL HIGH (ref 70–99)
Glucose-Capillary: 114 mg/dL — ABNORMAL HIGH (ref 70–99)
Glucose-Capillary: 125 mg/dL — ABNORMAL HIGH (ref 70–99)

## 2019-12-09 LAB — CBC
HCT: 44.1 % (ref 39.0–52.0)
Hemoglobin: 13.1 g/dL (ref 13.0–17.0)
MCH: 26.8 pg (ref 26.0–34.0)
MCHC: 29.7 g/dL — ABNORMAL LOW (ref 30.0–36.0)
MCV: 90.4 fL (ref 80.0–100.0)
Platelets: 279 10*3/uL (ref 150–400)
RBC: 4.88 MIL/uL (ref 4.22–5.81)
RDW: 15.9 % — ABNORMAL HIGH (ref 11.5–15.5)
WBC: 15.7 10*3/uL — ABNORMAL HIGH (ref 4.0–10.5)
nRBC: 0.1 % (ref 0.0–0.2)

## 2019-12-09 LAB — CULTURE, RESPIRATORY W GRAM STAIN: Culture: NORMAL

## 2019-12-09 LAB — BASIC METABOLIC PANEL
Anion gap: 10 (ref 5–15)
BUN: 15 mg/dL (ref 6–20)
CO2: 27 mmol/L (ref 22–32)
Calcium: 9 mg/dL (ref 8.9–10.3)
Chloride: 113 mmol/L — ABNORMAL HIGH (ref 98–111)
Creatinine, Ser: 1.49 mg/dL — ABNORMAL HIGH (ref 0.61–1.24)
GFR calc Af Amer: >60 mL/min (ref >60)
GFR calc non Af Amer: 59 mL/min — ABNORMAL LOW (ref >60)
Glucose, Bld: 146 mg/dL — ABNORMAL HIGH (ref 70–99)
Potassium: 3.8 mmol/L (ref 3.5–5.1)
Sodium: 150 mmol/L — ABNORMAL HIGH (ref 135–145)

## 2019-12-09 LAB — PHOSPHORUS
Phosphorus: 4.6 mg/dL (ref 2.5–4.6)
Phosphorus: 3.9 mg/dL (ref 2.5–4.6)

## 2019-12-09 LAB — MAGNESIUM
Magnesium: 2 mg/dL (ref 1.7–2.4)
Magnesium: 2.2 mg/dL (ref 1.7–2.4)

## 2019-12-09 MED ORDER — LABETALOL HCL 5 MG/ML IV SOLN
40.0000 mg | INTRAVENOUS | Status: DC | PRN
  Administered 2019-12-09 – 2019-12-10 (×3): 20 mg via INTRAVENOUS
  Administered 2019-12-10: 10 mg via INTRAVENOUS
  Administered 2019-12-10: 20 mg via INTRAVENOUS
  Administered 2019-12-10: 10 mg via INTRAVENOUS
  Administered 2019-12-10: 40 mg via INTRAVENOUS
  Administered 2019-12-10 (×2): 20 mg via INTRAVENOUS
  Administered 2019-12-11 – 2019-12-13 (×3): 40 mg via INTRAVENOUS
  Filled 2019-12-09 (×8): qty 8

## 2019-12-09 MED ORDER — HYDRALAZINE HCL 20 MG/ML IJ SOLN
20.0000 mg | Freq: Four times a day (QID) | INTRAMUSCULAR | Status: DC | PRN
  Administered 2019-12-10 – 2019-12-13 (×4): 20 mg via INTRAVENOUS
  Filled 2019-12-09 (×5): qty 1

## 2019-12-09 MED ORDER — HYDRALAZINE HCL 25 MG PO TABS: 25.0000 mg | ORAL_TABLET | Freq: Three times a day (TID) | ORAL | Status: DC

## 2019-12-09 MED ORDER — CARVEDILOL 12.5 MG PO TABS
12.5000 mg | ORAL_TABLET | Freq: Two times a day (BID) | ORAL | Status: DC
  Administered 2019-12-09 – 2019-12-10 (×3): 12.5 mg
  Filled 2019-12-09 (×3): qty 1

## 2019-12-09 MED ORDER — FUROSEMIDE 10 MG/ML IJ SOLN
40.0000 mg | Freq: Once | INTRAMUSCULAR | Status: AC
  Administered 2019-12-09: 40 mg via INTRAVENOUS

## 2019-12-09 MED ORDER — FUROSEMIDE 10 MG/ML IJ SOLN
INTRAMUSCULAR | Status: AC
  Filled 2019-12-09: qty 4

## 2019-12-09 MED ORDER — HYDRALAZINE HCL 50 MG PO TABS
50.0000 mg | ORAL_TABLET | Freq: Three times a day (TID) | ORAL | Status: DC
  Administered 2019-12-09 – 2019-12-10 (×3): 50 mg
  Filled 2019-12-09 (×3): qty 1

## 2019-12-09 MED ORDER — SODIUM CHLORIDE 3 % IV SOLN
INTRAVENOUS | Status: DC
  Filled 2019-12-09 (×5): qty 500

## 2019-12-09 MED ORDER — BISACODYL 10 MG RE SUPP
10.0000 mg | Freq: Once | RECTAL | Status: AC
  Administered 2019-12-09: 10 mg via RECTAL
  Filled 2019-12-09: qty 1

## 2019-12-09 MED ORDER — AMLODIPINE BESYLATE 10 MG PO TABS
10.0000 mg | ORAL_TABLET | Freq: Every day | ORAL | Status: DC
  Administered 2019-12-09 – 2019-12-13 (×4): 10 mg
  Filled 2019-12-09 (×4): qty 1

## 2019-12-09 NOTE — Progress Notes (Signed)
OT Cancellation Note  Patient Details Name: Dennis Mercado MRN: [id number] DOB: Apr 11, 1981   Cancelled Treatment:    Reason Eval/Treat Not Completed: Active bedrest order Pt remains on bedrest today. Per RN, hope to extubate pt in the next couple of days.  Ramond Dial, OT/L   Acute OT Clinical Specialist Acute Rehabilitation Services Pager [phone] Office [phone]  12/09/2019, 9:13 AM

## 2019-12-09 NOTE — Progress Notes (Signed)
NAME:  Dennis Mercado, MRN:  [id number], DOB:  07-29-1980, LOS: 3 ADMISSION DATE:  12/06/2019, CONSULTATION DATE: 12/07/2018 REFERRING MD: Neurology, CHIEF COMPLAINT: Status post intracerebral hemorrhage, hypoxia respiratory distress.  Brief History   39 year old with acute onset of left-sided weakness right thalami bleed admitted to the hospital for further evaluation and treatment.  History of present illness   39 year old male following intercourse with his wife felt ill and had left-sided weakness.  Brought to the hospital found to have a right thalami bleed which time neurology admitted him to the [facility] intensive care unit.  His mental status declined his respiratory status declined pulmonary critical care was called to bedside and found him tachypneic agitated and hypoxic.  He was electively intubated after discussions with him so would be able to travel to CT scan safely.  He also had a central line placed for 3% saline.  He may need interventricular drain plus or minus surgical interventions per neurosurgery.  Pulmonary critical care asked to consult he was intubated central line placed he is hemodynamically stable and being transferred to x-ray for further treatment evaluation.  Past Medical History  Noncompliant with medications hypertension  Significant Hospital Events   Intubated 12/07/2019  Consults:  12/07/2019 neurosurgery 12/07/2019 CCM  Procedures:  12/07/2019 intubation 12/07/2018 with left internal jugular central line placed  Significant Diagnostic Tests:  CT of the head with right ophthalmic bleeding  Micro Data:  6/22 fob>>  Antimicrobials:  6/22 unasyn>>  Interim history/subjective:   Intubated, sedated on vent in ICU   Objective   Blood pressure (!) 141/83, pulse 83, temperature 98.8 F (37.1 C), resp. rate 18, height 5\' 7"  (1.702 m), weight 75.1 kg, SpO2 98 %.    Vent Mode: PRVC FiO2 (%):  [40 %-60 %] 60 % Set Rate:  [18 bmp] 18 bmp Vt Set:  [520 mL] 520  mL PEEP:  [5 cmH20] 5 cmH20 Plateau Pressure:  [12 cmH20-24 cmH20] 19 cmH20   Intake/Output Summary (Last 24 hours) at 12/09/2019 0949 Last data filed at 12/09/2019 0800 Gross per 24 hour  Intake 3427.22 ml  Output 4950 ml  Net -1522.78 ml   Filed Weights   12/06/19 0455 12/06/19 0940  Weight: 75.5 kg 75.1 kg    Examination: General: young male, intubated on life support  HENT: ETT in place, tracking off sedation  Lungs: diminshed BL breaths, left base with rhonchi  Cardiovascular: RRR, s1 s2 Abdomen: soft nt nd  Extremities: some dependent edema  Neuro: sedated on vent, he will follow commands with sedation lightened  GU: deferred   Resolved Hospital Problem list     Assessment & Plan:   Acute Hypoxemic Respiratory Failure, LLL Aspiration pneumonia, dense consolidation  - adulte vent protocol  - weaning as tolerated  - Weaning peep and FIO2  - sedation with PAD guidelines  - propofol and fent   Suspected aspiration pna - unasyn X7 days  - still not ready to come off vent   Acute right thalamus intracranial hemorrhage on 12/07/2019 - CT and  MRI stable   Poorly controlled hypertension - increase orals today  - wean from nicardipine ggt   Best practice:  Diet: NPO Pain/Anxiety/Delirium protocol (if indicated): CCM pain and agitation management scale VAP protocol (if indicated): Employed DVT prophylaxis: Compression hose only due to intracranial hemorrhage GI prophylaxis: PPI Glucose control: cbgs with ssi  Mobility: Bedrest Code Status: Full Family Communication: wife updated 12/09/2019  Disposition: Neurosurgical intensive care unit  Labs  CBC: Recent Labs  Lab 12/06/19 0422 12/06/19 0428 12/07/19 0519 12/07/19 0519 12/07/19 0714 12/07/19 0906 12/08/19 0359 12/08/19 0518 12/09/19 0444  WBC 11.6*  --  20.6*  --   --   --   --  12.8* 15.7*  NEUTROABS 6.7  --   --   --   --   --   --  10.4*  --   HGB 14.7   < > 13.4   < > 12.9* 12.9* 11.9* 12.4*  13.1  HCT 45.9   < > 42.3   < > 38.0* 38.0* 35.0* 41.8 44.1  MCV 85.6  --  85.8  --   --   --   --  91.7 90.4  PLT 351  --  323  --   --   --   --  265 279   < > = values in this interval not displayed.    Basic Metabolic Panel: Recent Labs  Lab 12/06/19 0422 12/06/19 0422 12/06/19 0428 12/06/19 0428 12/06/19 1203 12/06/19 1518 12/07/19 0519 12/07/19 0519 12/07/19 0714 12/07/19 0714 12/07/19 0906 12/07/19 1110 12/08/19 0359 12/08/19 0359 12/08/19 0518 12/08/19 1024 12/08/19 1711 12/09/19 0006 12/09/19 0444  NA 140   < > 142   < > 140   < > 147*   < > 153*   < > 151*   < > 160*   < > 156* 158* 151* 153* 150*  K 3.2*   < > 2.9*   < >  --   --  4.2   < > 4.1  --  4.1  --  4.2  --  4.2  --   --   --  3.8  CL 105  --  102  --   --   --  120*  --   --   --   --   --   --   --  126*  --   --   --  113*  CO2 23  --   --   --   --   --  17*  --   --   --   --   --   --   --  24  --   --   --  27  GLUCOSE 183*  --  183*  --   --   --  137*  --   --   --   --   --   --   --  95  --   --   --  146*  BUN 14  --  15  --   --   --  12  --   --   --   --   --   --   --  13  --   --   --  15  CREATININE 1.52*  --  1.40*  --   --   --  1.44*  --   --   --   --   --   --   --  1.57*  --   --   --  1.49*  CALCIUM 8.9  --   --   --   --   --  8.1*  --   --   --   --   --   --   --  8.3*  --   --   --  9.0  MG  --   --   --   --  1.9  --   --   --   --   --   --   --   --   --  2.0  --  1.8  --  2.0  PHOS  --   --   --   --   --   --   --   --   --   --   --   --   --   --  3.3  --  3.4  --  4.6   < > = values in this interval not displayed.   GFR: Estimated Creatinine Clearance: 62.8 mL/min (A) (by C-G formula based on SCr of 1.49 mg/dL (H)). Recent Labs  Lab 12/06/19 0422 12/07/19 0519 12/08/19 0518 12/09/19 0444  WBC 11.6* 20.6* 12.8* 15.7*    Liver Function Tests: Recent Labs  Lab 12/06/19 0422  AST 25  ALT 22  ALKPHOS 57  BILITOT 0.5  PROT 6.9  ALBUMIN 4.1   No results  for input(s): LIPASE, AMYLASE in the last 168 hours. No results for input(s): AMMONIA in the last 168 hours.  ABG    Component Value Date/Time   PHART 7.316 (L) 12/08/2019 0359   PCO2ART 48.1 (H) 12/08/2019 0359   PO2ART 146 (H) 12/08/2019 0359   HCO3 24.4 12/08/2019 0359   TCO2 26 12/08/2019 0359   ACIDBASEDEF 2.0 12/08/2019 0359   O2SAT 99.0 12/08/2019 0359     Coagulation Profile: Recent Labs  Lab 12/06/19 0422  INR 0.9    Cardiac Enzymes: No results for input(s): CKTOTAL, CKMB, CKMBINDEX, TROPONINI in the last 168 hours.  HbA1C: Hgb A1c MFr Bld  Date/Time Value Ref Range Status  12/06/2019 12:03 PM 5.6 4.8 - 5.6 % Final    Comment:    (NOTE)         Prediabetes: 5.7 - 6.4         Diabetes: >6.4         Glycemic control for adults with diabetes: <7.0     CBG: Recent Labs  Lab 12/06/19 0415 12/08/19 1926 12/08/19 2335 12/09/19 0323 12/09/19 0756  GLUCAP 158* 114* 135* 125* 122*    This patient is critically ill with multiple organ system failure; which, requires frequent high complexity decision making, assessment, support, evaluation, and titration of therapies. This was completed through the application of advanced monitoring technologies and extensive interpretation of multiple databases. During this encounter critical care time was devoted to patient care services described in this note for 31 minutes.  Garner Nash, DO [facility] 12/09/2019 9:55 AM

## 2019-12-09 NOTE — Progress Notes (Signed)
PT Cancellation Note  Patient Details Name: Dennis Mercado MRN: [id number] DOB: 1981-05-21   Cancelled Treatment:    Reason Eval/Treat Not Completed: Active bedrest order. Pt also remains intubated at this time. PT will follow up tomorrow.   Zenaida Niece 12/09/2019, 2:07 PM

## 2019-12-09 NOTE — Progress Notes (Signed)
STROKE TEAM PROGRESS NOTE   INTERVAL HISTORY Patient remains intubated for respiratory failure and aspiration pneumonia and is sedated with propofol drip and fentanyl.  He is still requiring high oxygen FiO2 of 0.60 sedation for agitation patient can be aroused and follows simple commands well on the right but remains paralyzed on the left.  His wife is at the bedside.  Vital signs are stable.  He is still on nicardipine drip Vitals:   12/09/19 1700 12/09/19 1800 12/09/19 1900 12/09/19 1955  BP: (!) 161/100 (!) 93/53 (!) 144/102 140/87  Pulse: (!) 109 89 (!) 102 (!) 107  Resp: _0 (!) 25  Temp: (!) 100.8 F (38.2 C) (!) 100.6 F (38.1 C) (!) 100.6 F (38.1 C)   TempSrc:  Bladder    SpO2: 99% 100% 100%   Weight:      Height:       CBC:  Recent Labs  Lab 12/06/19 0422 12/06/19 0428 12/08/19 0518 12/09/19 0444  WBC 11.6*   < > 12.8* 15.7*  NEUTROABS 6.7  --  10.4*  --   HGB 14.7   < > 12.4* 13.1  HCT 45.9   < > 41.8 44.1  MCV 85.6   < > 91.7 90.4  PLT 351   < > 265 279   < > = values in this interval not displayed.   Basic Metabolic Panel:  Recent Labs  Lab 12/08/19 0518 12/08/19 1024 12/09/19 0444 12/09/19 0444 12/09/19 1030 12/09/19 1635  NA 156*   < > 150*   < > 151* 153*  K 4.2  --  3.8  --   --   --   CL 126*  --  113*  --   --   --   CO2 24  --  27  --   --   --   GLUCOSE 95  --  146*  --   --   --   BUN 13  --  15  --   --   --   CREATININE 1.57*  --  1.49*  --   --   --   CALCIUM 8.3*  --  9.0  --   --   --   MG 2.0   < > 2.0  --   --  2.2  PHOS 3.3   < > 4.6  --   --  3.9   < > = values in this interval not displayed.   Lipid Panel:     Component Value Date/Time   CHOL 232 (H) 12/06/2019 1203   TRIG 126 12/07/2019 0519   HDL 42 12/06/2019 1203   CHOLHDL 5.5 12/06/2019 1203   VLDL UNABLE TO CALCULATE IF TRIGLYCERIDE OVER 400 mg/dL 12/06/2019 1203   LDLCALC UNABLE TO CALCULATE IF TRIGLYCERIDE OVER 400 mg/dL 12/06/2019 1203   HgbA1c:  Lab  Results  Component Value Date   HGBA1C 5.6 12/06/2019   Urine Drug Screen:     Component Value Date/Time   LABOPIA NONE DETECTED 12/06/2019 1200   COCAINSCRNUR NONE DETECTED 12/06/2019 1200   LABBENZ NONE DETECTED 12/06/2019 1200   AMPHETMU NONE DETECTED 12/06/2019 1200   THCU POSITIVE (A) 12/06/2019 1200   LABBARB NONE DETECTED 12/06/2019 1200    Alcohol Level     Component Value Date/Time   ETH <10 12/06/2019 1203    IMAGING past 24 hours No results found.  PHYSICAL EXAM   Pleasant young African-American male who is sedated and intubated. . Afebrile.  Head is nontraumatic. Neck is supple without bruit.    Cardiac exam no murmur or gallop. Lungs are clear to auscultation. Distal pulses are well felt. Neurological Exam :  Patient is sedated and intubated on ventilatory support.  Patient can be aroused and follows simple commands on the right side.  Eyes are slightly dysconjugate with right eye exotropia mild.  Extraocular movements are almost full range though there is slight restriction of left lateral gaze. Fundi not visualized left lower facial weakness.  Tongue midline.     Dense left hemiplegia 0/5 strength with brisk withdrawal in the lower extremity and trace withdrawal in the left upper extremity.  Normal antigravity strength on the right.  Gait not tested.  Sensation is diminished on the left compared to the right.  Left plantar upgoing left downgoing.  ASSESSMENT/PLAN [name] is a 39 y.o. male with recent HTN at dentist office with plans for PCP visit next week, presenting post coitus with L sided weakness, elevated BP.   Stroke:   Hypertensive R thalamic ICH w/ IVH New onset respiratory failure likely secondary to aspiration pneumonia.  Code Stroke CT head R thalamic hemorrhage, 21m. IVH. Local mass effect w/o midline shift  CTA head Unremarkable   CT head at 12h stable  CT head 6/22 0440 stable R thalamic hemorrhage, IVH. Sllight increased in ventricle  size. 4-5 midline shift  CT head 6/22 remains stable     MRI  pending    Carotid Doppler  pending    2D Echo EF 60-65%. No source of embolus    LDL UTC d/t high TG, direct LDL 157  HgbA1c 5.6   SCDs for VTE prophylaxis  No antithrombotic prior to admission, now on No antithrombotic given hemorrhage   Therapy recommendations:  pending   Disposition:  pending   Bedrest for now  Cerebral Edema   On 3% protocol  3% at 75h  NA 151-153-151-150  Goal 150-155  Repeat CT stable  Hypoxic Respiratory Failure Aspiration PNA  Emergently intubated 6/22 am  CT chest L consolidation   Bronchoscopy, BAL to LLL  TM 101.8  WBC 20.6  Unasyn 6/22>>    CCM onboard   Hypertensive Emergency w/ Tachycardia  BP as high as 219/162   Home meds:  None - has PCP appt June 29th - referred to PCP by Dentist who noted high BP  SBP goal < 140  On arrival BP difficult to control. Received cleviprex up to 32, hydralazine and labetolol prns  Changed Cleviprex to cardene with good resuls . Added clonidine patch 0.16m . Long-term BP goal normotensive  Hyperlipidemia  Home meds: none   LDL 157  Hold statin in setting of acute hemorrhage   Consider statin at d/c or f/u appt  Dysphagia . Secondary to stroke . NPO . Speech on board  Other Stroke Risk Factors  ETOH use, advised to drink no more than 2 drink(s) a day  Substance abuse - UDS:  THC Positive. Patient advised to stop using due to stroke risk.  Other Active Problems  Hypokalemia 3.2-2.9-4.1 - replace (Mag ok)   AKI Cre 1.5-1.4-1.44. on IVF  Hospital day # 3  Continue ventilatory support and treatment for presumed aspiration pneumonia as per critical care team.  Continue strict blood pressure control with systolic blood pressure goal below 160.  Increase Norvasc to 10 mg and continue hydrochlorothiazide 25 mg.  Use as needed IV labetalol and hydralazine and wean off nicardipine drip.  Hopefully  wean off  ventilatory support in the next few days for respiratory failure and pneumonia and then extubate as tolerated.  Long discussion at the bedside with the patient's wife and answered questions.  Discussed with Dr.Icard from critical care medicine and patient's wife at the bedside and answered questions This patient is critically ill and at significant risk of neurological worsening, death and care requires constant monitoring of vital signs, hemodynamics,respiratory and cardiac monitoring, extensive review of multiple databases, frequent neurological assessment, discussion with family, other specialists and medical decision making of high complexity.I have made any additions or clarifications directly to the above note.This critical care time does not reflect procedure time, or teaching time or supervisory time of PA/NP/Med Resident etc but could involve care discussion time.  I spent 30 minutes of neurocritical care time  in the care of  this patient.   Antony Contras, MD  To contact Stroke Continuity provider, please refer to [email or web address] After hours, contact General Neurology

## 2019-12-10 ENCOUNTER — Inpatient Hospital Stay (HOSPITAL_COMMUNITY): Payer: BC Managed Care – PPO

## 2019-12-10 LAB — CBC WITH DIFFERENTIAL/PLATELET
Abs Immature Granulocytes: 0.1 10*3/uL — ABNORMAL HIGH (ref 0.00–0.07)
Basophils Absolute: 0 10*3/uL (ref 0.0–0.1)
Basophils Relative: 0 %
Eosinophils Absolute: 0.1 10*3/uL (ref 0.0–0.5)
Eosinophils Relative: 1 %
HCT: 43.7 % (ref 39.0–52.0)
Hemoglobin: 13.2 g/dL (ref 13.0–17.0)
Immature Granulocytes: 1 %
Lymphocytes Relative: 11 %
Lymphs Abs: 1.2 10*3/uL (ref 0.7–4.0)
MCH: 26.9 pg (ref 26.0–34.0)
MCHC: 30.2 g/dL (ref 30.0–36.0)
MCV: 89.2 fL (ref 80.0–100.0)
Monocytes Absolute: 0.9 10*3/uL (ref 0.1–1.0)
Monocytes Relative: 8 %
Neutro Abs: 9 10*3/uL — ABNORMAL HIGH (ref 1.7–7.7)
Neutrophils Relative %: 79 %
Platelets: 275 10*3/uL (ref 150–400)
RBC: 4.9 MIL/uL (ref 4.22–5.81)
RDW: 15.7 % — ABNORMAL HIGH (ref 11.5–15.5)
WBC: 11.4 10*3/uL — ABNORMAL HIGH (ref 4.0–10.5)
nRBC: 0.2 % (ref 0.0–0.2)

## 2019-12-10 LAB — GLUCOSE, CAPILLARY
Glucose-Capillary: 101 mg/dL — ABNORMAL HIGH (ref 70–99)
Glucose-Capillary: 86 mg/dL (ref 70–99)
Glucose-Capillary: 98 mg/dL (ref 70–99)

## 2019-12-10 LAB — BASIC METABOLIC PANEL
Anion gap: 10 (ref 5–15)
BUN: 22 mg/dL — ABNORMAL HIGH (ref 6–20)
CO2: 26 mmol/L (ref 22–32)
Calcium: 8.7 mg/dL — ABNORMAL LOW (ref 8.9–10.3)
Chloride: 119 mmol/L — ABNORMAL HIGH (ref 98–111)
Creatinine, Ser: 1.51 mg/dL — ABNORMAL HIGH (ref 0.61–1.24)
GFR calc Af Amer: >60 mL/min (ref >60)
GFR calc non Af Amer: 58 mL/min — ABNORMAL LOW (ref >60)
Glucose, Bld: 94 mg/dL (ref 70–99)
Potassium: 3.7 mmol/L (ref 3.5–5.1)
Sodium: 155 mmol/L — ABNORMAL HIGH (ref 135–145)

## 2019-12-10 LAB — MAGNESIUM: Magnesium: 2.1 mg/dL (ref 1.7–2.4)

## 2019-12-10 LAB — PHOSPHORUS: Phosphorus: 3.1 mg/dL (ref 2.5–4.6)

## 2019-12-10 LAB — SODIUM
Sodium: 157 mmol/L — ABNORMAL HIGH (ref 135–145)
Sodium: 150 mmol/L — ABNORMAL HIGH (ref 135–145)

## 2019-12-10 LAB — TRIGLYCERIDES: Triglycerides: 486 mg/dL — ABNORMAL HIGH (ref <150)

## 2019-12-10 MED ORDER — DEXMEDETOMIDINE HCL IN NACL 400 MCG/100ML IV SOLN
0.4000 ug/kg/h | INTRAVENOUS | Status: DC
  Administered 2019-12-10 (×2): 0.4 ug/kg/h via INTRAVENOUS
  Administered 2019-12-11: 0.6 ug/kg/h via INTRAVENOUS
  Administered 2019-12-11 (×2): 1.2 ug/kg/h via INTRAVENOUS
  Administered 2019-12-11: 0.602 ug/kg/h via INTRAVENOUS
  Administered 2019-12-11: 0.8 ug/kg/h via INTRAVENOUS
  Administered 2019-12-12: 1.8 ug/kg/h via INTRAVENOUS
  Administered 2019-12-12: 0.4 ug/kg/h via INTRAVENOUS
  Administered 2019-12-12 (×2): 1.8 ug/kg/h via INTRAVENOUS
  Administered 2019-12-12: 0.4 ug/kg/h via INTRAVENOUS
  Filled 2019-12-10 (×10): qty 100

## 2019-12-10 MED ORDER — SODIUM CHLORIDE 0.9 % IV SOLN
4500.0000 mg | INTRAVENOUS | Status: DC
  Filled 2019-12-10: qty 45

## 2019-12-10 MED ORDER — HALOPERIDOL LACTATE 5 MG/ML IJ SOLN
1.0000 mg | INTRAMUSCULAR | Status: DC | PRN
  Administered 2019-12-10: 4 mg via INTRAVENOUS
  Filled 2019-12-10: qty 1

## 2019-12-10 MED ORDER — FUROSEMIDE 10 MG/ML IJ SOLN
40.0000 mg | Freq: Once | INTRAMUSCULAR | Status: AC
  Administered 2019-12-10: 40 mg via INTRAVENOUS
  Filled 2019-12-10: qty 4

## 2019-12-10 MED ORDER — MIDAZOLAM HCL 2 MG/2ML IJ SOLN
INTRAMUSCULAR | Status: AC
  Filled 2019-12-10: qty 2

## 2019-12-10 MED ORDER — LORAZEPAM 2 MG/ML IJ SOLN
2.0000 mg | INTRAMUSCULAR | Status: DC | PRN
  Administered 2019-12-10: 2 mg via INTRAVENOUS
  Filled 2019-12-10: qty 1

## 2019-12-10 MED ORDER — VALPROATE SODIUM 500 MG/5ML IV SOLN
1500.0000 mg | INTRAVENOUS | Status: AC
  Administered 2019-12-10: 1500 mg via INTRAVENOUS
  Filled 2019-12-10: qty 15

## 2019-12-10 MED ORDER — VALPROATE SODIUM 500 MG/5ML IV SOLN: 2000.0000 mg | Freq: Once | INTRAVENOUS | Status: DC

## 2019-12-10 MED ORDER — LORAZEPAM 2 MG/ML IJ SOLN
INTRAMUSCULAR | Status: AC
  Administered 2019-12-10: 1 mg
  Filled 2019-12-10: qty 1

## 2019-12-10 MED ORDER — PRO-STAT SUGAR FREE PO LIQD: 30.0000 mL | Freq: Two times a day (BID) | ORAL | Status: DC

## 2019-12-10 MED ORDER — LEVETIRACETAM IN NACL 1000 MG/100ML IV SOLN: 1000.0000 mg | Freq: Two times a day (BID) | INTRAVENOUS | Status: DC

## 2019-12-10 MED ORDER — LABETALOL HCL 5 MG/ML IV SOLN: 40.0000 mg | Freq: Once | INTRAVENOUS | Status: AC

## 2019-12-10 MED ORDER — FENTANYL CITRATE (PF) 100 MCG/2ML IJ SOLN
INTRAMUSCULAR | Status: AC
  Filled 2019-12-10: qty 2

## 2019-12-10 MED ORDER — SPIRONOLACTONE 25 MG PO TABS
25.0000 mg | ORAL_TABLET | Freq: Every day | ORAL | Status: DC
  Administered 2019-12-10: 25 mg
  Filled 2019-12-10 (×2): qty 1

## 2019-12-10 MED ORDER — SODIUM CHLORIDE 0.9 % IV SOLN
2000.0000 mg | INTRAVENOUS | Status: DC
  Filled 2019-12-10: qty 20

## 2019-12-10 MED ORDER — VALPROATE SODIUM 500 MG/5ML IV SOLN
500.0000 mg | Freq: Four times a day (QID) | INTRAVENOUS | Status: DC
  Administered 2019-12-10 – 2019-12-12 (×6): 500 mg via INTRAVENOUS
  Filled 2019-12-10 (×8): qty 5

## 2019-12-10 MED ORDER — CARVEDILOL 12.5 MG PO TABS
12.5000 mg | ORAL_TABLET | Freq: Once | ORAL | Status: AC
  Administered 2019-12-10: 12.5 mg
  Filled 2019-12-10: qty 1

## 2019-12-10 MED ORDER — OSMOLITE 1.5 CAL PO LIQD
1000.0000 mL | ORAL | Status: DC
  Filled 2019-12-10 (×2): qty 1000

## 2019-12-10 MED ORDER — VALPROATE SODIUM 500 MG/5ML IV SOLN: 500.0000 mg | Freq: Four times a day (QID) | INTRAVENOUS | Status: DC

## 2019-12-10 MED ORDER — CARVEDILOL 12.5 MG PO TABS
25.0000 mg | ORAL_TABLET | Freq: Two times a day (BID) | ORAL | Status: DC
  Administered 2019-12-10: 25 mg
  Filled 2019-12-10: qty 2
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
## 2019-12-10 NOTE — Procedures (Signed)
Cortrak  Person Inserting Tube:  Grae Leathers, Creola Corn, RD Tube Type:  Cortrak - 43 inches Tube Location:  Right nare Initial Placement:  Stomach Secured by: Bridle Technique Used to Measure Tube Placement:  Documented cm marking at nare/ corner of mouth Cortrak Secured At:  65 cm    Cortrak Tube Team Note:  Consult received to place a Cortrak feeding tube.   No x-ray is required. RN may begin using tube.    If the tube becomes dislodged please keep the tube and contact the Cortrak team at [email or web address] ([email or web address]) for replacement.  If after hours and replacement cannot be delayed, place a NG tube and confirm placement with an abdominal x-ray.    Larkin Ina, MS, RD, LDN RD pager number and weekend/on-call pager number located in [place].

## 2019-12-10 NOTE — Progress Notes (Signed)
SLP Cancellation Note  Patient Details Name: Dryden Tapley MRN: [id number] DOB: 08-11-1980   Cancelled treatment:       Reason Eval/Treat Not Completed: Patient not medically ready, has been sedated on the vent. Will sign off for now, but please reorder when ready.     Osie Bond., M.A. [facility] Pager [phone] Office [phone]  12/10/2019, 8:03 AM

## 2019-12-10 NOTE — Procedures (Signed)
Patient Name: Dennis Mercado  MRN: [id number]  Epilepsy Attending: Lora Havens  Referring Physician/Provider: Eliseo Gum, NP Date: 12/10/2019 Duration: 23.13 mins  Patient history: 39 year old male with right thalamic ICH with IVH.  Today patient was noted to have an episode where he had left-sided contraction which eventually spread to right side contraction, upper and lower extremity rigidity with rhythmic contraction as well as upward gaze concerning for seizures.  EEG evaluate for seizures.  Level of alertness: comatose/ sedated  AEDs during EEG study: Ativan, Depakote  Technical aspects: This EEG study was done with scalp electrodes positioned according to the 10-20 International system of electrode placement. Electrical activity was acquired at a sampling rate of 500Hz  and reviewed with a high frequency filter of 70Hz  and a low frequency filter of 1Hz . EEG data were recorded continuously and digitally stored.   Description: EEG showed continuous low amplitude 2 to 3 Hz delta slowing.  EEG was reactive to tactile stimulation. Hyperventilation and photic stimulation were not performed.     ABNORMALITY -Continuous slow, generalized  IMPRESSION: This study is suggestive of severe diffuse encephalopathy, nonspecific to etiology but most likely related to sedation.  No seizures or epileptiform discharges were seen throughout the recording.  [name]

## 2019-12-10 NOTE — Progress Notes (Signed)
STROKE TEAM PROGRESS NOTE   INTERVAL HISTORY Patient remains sedated and intubated for respiratory failure from pneumonia but is doing better and oxygen requirement is now down to FiO2 of 0.4 and is on pressure support and being weaned.  Neurological exam remains unchanged and he can be aroused and follows commands well on the right side but remains plegic on the left.  Blood pressure is still requiring nicardipine drip despite being on 3 different agents.  He still gets intermittently agitated which elevates his blood pressure.   Vitals:   12/10/19 0930 12/10/19 1000 12/10/19 1100 12/10/19 1145  BP: (!) 141/89 132/84 120/85 127/85  Pulse: (!) 101 92 91 (!) 107  Resp: _0 Temp: 99.5 F (37.5 C) 99.7 F (37.6 C) 100.2 F (37.9 C)   TempSrc:      SpO2: 99% 98% 99% 99%  Weight:      Height:       CBC:  Recent Labs  Lab 12/08/19 0518 12/08/19 0518 12/09/19 0444 12/10/19 0423  WBC 12.8*   < > 15.7* 11.4*  NEUTROABS 10.4*  --   --  9.0*  HGB 12.4*   < > 13.1 13.2  HCT 41.8   < > 44.1 43.7  MCV 91.7   < > 90.4 89.2  PLT 265   < > 279 275   < > = values in this interval not displayed.   Basic Metabolic Panel:  Recent Labs  Lab 12/09/19 0444 12/09/19 1030 12/09/19 1635 12/09/19 2157 12/10/19 0423 12/10/19 1020  NA 150*   < > 153*   < > 155* 157*  K 3.8  --   --   --  3.7  --   CL 113*  --   --   --  119*  --   CO2 27  --   --   --  26  --   GLUCOSE 146*  --   --   --  94  --   BUN 15  --   --   --  22*  --   CREATININE 1.49*  --   --   --  1.51*  --   CALCIUM 9.0  --   --   --  8.7*  --   MG 2.0  --  2.2  --  2.1  --   PHOS 4.6  --  3.9  --  3.1  --    < > = values in this interval not displayed.   Lipid Panel:     Component Value Date/Time   CHOL 232 (H) 12/06/2019 1203   TRIG 486 (H) 12/10/2019 0814   HDL 42 12/06/2019 1203   CHOLHDL 5.5 12/06/2019 1203   VLDL UNABLE TO CALCULATE IF TRIGLYCERIDE OVER 400 mg/dL 12/06/2019 1203   LDLCALC UNABLE TO  CALCULATE IF TRIGLYCERIDE OVER 400 mg/dL 12/06/2019 1203   HgbA1c:  Lab Results  Component Value Date   HGBA1C 5.6 12/06/2019   Urine Drug Screen:     Component Value Date/Time   LABOPIA NONE DETECTED 12/06/2019 1200   COCAINSCRNUR NONE DETECTED 12/06/2019 1200   LABBENZ NONE DETECTED 12/06/2019 1200   AMPHETMU NONE DETECTED 12/06/2019 1200   THCU POSITIVE (A) 12/06/2019 1200   LABBARB NONE DETECTED 12/06/2019 1200    Alcohol Level     Component Value Date/Time   ETH <10 12/06/2019 1203    IMAGING past 24 hours DG Abd 1 View  Result Date: 12/10/2019 CLINICAL DATA:  Orogastric tube placement. EXAM: ABDOMEN - 1 VIEW COMPARISON:  December 07, 2019. FINDINGS: The bowel gas pattern is normal. Enteric tube tip is seen in proximal stomach. No radio-opaque calculi or other significant radiographic abnormality are seen. IMPRESSION: Enteric tube tip seen in proximal stomach. No evidence of bowel obstruction or ileus. Electronically Signed   By: Marijo Conception M.D.   On: 12/10/2019 08:45     MRI WO Contrast  12/08/19 IMPRESSION: 1. Unchanged size of right thalamic intraparenchymal hematoma with mild surrounding edema. No hydrocephalus or ventricular entrapment. 2. 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  Bilateral Carotid Dopplers  12/08/19 Summary:  Right Carotid: Velocities in the right ICA are consistent with a 1-39% stenosis.  Left Carotid: Velocities in the visualized segment of the left ICA are consistent with a 1-39% stenosis.  Vertebrals: Right vertebral artery demonstrates antegrade flow. Left vertebral artery was not visualized.  Subclavians: Left subclavian artery was not visualized. Normal flow hemodynamics were seen in the right subclavian artery.   Portable CXR 12/08/19 IMPRESSION: Stable support apparatus. Stable large left lung opacity concerning for pneumonia. Improved right basilar opacity is noted. No significant pleural  effusion is noted.  PHYSICAL EXAM    Pleasant young African-American male who is sedated and intubated. . Afebrile. Head is nontraumatic. Neck is supple without bruit.    Cardiac exam no murmur or gallop. Lungs are clear to auscultation. Distal pulses are well felt. Neurological Exam :  Patient is sedated and intubated on ventilatory support.  Patient can be aroused and follows simple commands on the right side.  Eyes are slightly dysconjugate with right eye exotropia mild.  Extraocular movements are almost full range though there is slight restriction of left lateral gaze. Fundi not visualized left lower facial weakness.  Tongue midline.     Dense left hemiplegia 0/5 strength with brisk withdrawal in the lower extremity and trace withdrawal in the left upper extremity.  Normal antigravity strength on the right.  Gait not tested.  Sensation is diminished on the left compared to the right.  Left plantar upgoing left downgoing.  ASSESSMENT/PLAN [name] is a 39 y.o. male with recent HTN at dentist office with plans for PCP visit next week, presenting post coitus with L sided weakness, elevated BP.   Stroke:   Hypertensive R thalamic ICH w/ IVH New onset respiratory failure likely secondary to aspiration pneumonia.  Code Stroke CT head R thalamic hemorrhage, 29m. IVH. Local mass effect w/o midline shift  CTA head Unremarkable   CT head at 12h stable  CT head 6/22 0440 stable R thalamic hemorrhage, IVH. Sllight increased in ventricle size. 4-5 midline shift  CT head 6/22 remains stable     MRI - Unchanged size of right thalamic intraparenchymal hematoma with mild surrounding edema. No hydrocephalus or ventricular entrapment. 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  Carotid Doppler - unremarkable although left subclavian artery and left vertebral artery not visualized.  2D Echo EF 60-65%. No source of embolus    LDL UTC d/t high TG,  direct LDL 157  HgbA1c 5.6   SCDs for VTE prophylaxis  No antithrombotic prior to admission, now on No antithrombotic given hemorrhage   Therapy recommendations: not medically ready for PT / OT   Disposition:  pending   Bedrest for now  Cerebral Edema   On 3% protocol  3% at 50 cc / hr  NA 151-153-151-150->155->157  Goal 150-155  Repeat CT stable  Na  now 41 - discussed with Dr Leonie Man - hold 3%. Recheck Na in 6 hrs.   Hypoxic Respiratory Failure Aspiration PNA  Emergently intubated 6/22 am  CT chest L consolidation   Bronchoscopy, BAL to LLL  TM - 101.8->100.2 (99.7)  WBC - 20.6->12.8->15.7->11.4  Unasyn 6/22>>    CXR - 6/23 - Stable large left lung opacity concerning for pneumonia. Improved right basilar opacity is noted  CCM onboard   Hypertensive Emergency w/ Tachycardia  BP as high as 219/162   Home meds:  None - has PCP appt June 29th - referred to PCP by Dentist who noted high BP  SBP goal < 140  SBP - 153->158->202->133  On arrival BP difficult to control. Received cleviprex up to 32, hydralazine and labetolol prns  Changed Cleviprex to cardene with good resuls . Added clonidine patch 0.68m (D/C'd 6/24) . Now on - Coreg 12.5 mg Bid ; Norvasc 10 mg QD ; Hydralazine 50 mg Q8 hrs ; HCTZ 25 mg QD ; (Labetalol prn ; Cardene prn ; Hydralazine prn) . Long-term BP goal normotensive  Hyperlipidemia  Home meds: none   LDL 157  Hold statin in setting of acute hemorrhage   Consider statin at d/c or f/u appt  Dysphagia . Secondary to stroke . NPO . Speech on board . Tube feedings - 60 cc / hr (started 6/23)  Other Stroke Risk Factors  ETOH use, advised to drink no more than 2 drink(s) a day  Substance abuse - UDS:  THC Positive. Patient advised to stop using due to stroke risk.  Other Active Problems  Hypokalemia 3.2-2.9-4.1->3.7   (Mag ok)   AKI Cre 1.5-1.4-1.44->1.51 on IVF (3% @ 50 cc / hr) (NS flushes and KVO) (Tube feedings - 60  cc / hr) Consider NS IV at 50 cc / hr  Hospital day # 4  Continue weaning off ventilatory support and extubate as tolerated per CCM team.  Add Aldactone for blood pressure and use as needed hydralazine and labetalol and wean and discontinue nicardipine drip.  Discussed with pharmacist and Dr. IValeta Harmsand patient's wife and answered questions. This patient is critically ill and at significant risk of neurological worsening, death and care requires constant monitoring of vital signs, hemodynamics,respiratory and cardiac monitoring, extensive review of multiple databases, frequent neurological assessment, discussion with family, other specialists and medical decision making of high complexity.I have made any additions or clarifications directly to the above note.This critical care time does not reflect procedure time, or teaching time or supervisory time of PA/NP/Med Resident etc but could involve care discussion time.  I spent 30 minutes of neurocritical care time  in the care of  this patient.    PAntony Contras MD    To contact Stroke Continuity provider, please refer to [email or web address] After hours, contact General Neurology

## 2019-12-10 NOTE — Progress Notes (Signed)
STAT EEG completed; results pending. Dr Yadav notified. 

## 2019-12-10 NOTE — Evaluation (Signed)
Physical Therapy Evaluation Patient Details Name: Dennis Mercado MRN: [id number] DOB: 1981-01-18 Today's Date: 12/10/2019   History of Present Illness  39 y.o. male with a PMHx of HTN who presents via EMS as a Code Stroke after acute onset of left sided weakness and headache within minutes of finishing coitus with his wife. CT head reveals an acute hemorrhage centered in the right thalamus, extending into the ventricles, with local mass effect without midline shift. Pt required intubation 6/22 due to ARF secondary to aspiration PNA. PMH non contributory   Clinical Impression  Pt presents to PT with significant lethargy likely due to sedation which limits PT evaluation. RN reporting prior to session that pt was able to be aroused, however recently transitioning sedatives prior to session, leading to increased lethargy. Pt with minimal response to pain, moving RUE, otherwise no other extremity noted to move actively during session. Pt noted to have dysconjugate gaze when assisted in eye opening. Mobility limited as pt weaning from vent at time of eval, RN requesting bed-level mobility to not disturb progress toward extubation. Pt will benefit from continued attempts at mobility to improve level of arousal, activity tolerance, and mobility quality. PT hopeful pt will progress and become appropriate to discharge to CIR when medically ready.     Follow Up Recommendations CIR (hopeful for progression when not sedated)    Equipment Recommendations  Wheelchair (measurements PT);Wheelchair cushion (measurements PT);Hospital bed (mechanical lift if home today)    Recommendations for Other Services       Precautions / Restrictions Precautions Precautions: Fall Precaution Comments: Lt hemiparesis Restrictions Weight Bearing Restrictions: No      Mobility  Bed Mobility Overal bed mobility: Needs Assistance Bed Mobility: Rolling Rolling: Total assist         General bed mobility comments: Pt  unable to assist   Transfers                 General transfer comment: unable to attempt   Ambulation/Gait                Stairs            Wheelchair Mobility    Modified Rankin (Stroke Patients Only) Modified Rankin (Stroke Patients Only) Pre-Morbid Rankin Score: No symptoms Modified Rankin: Severe disability     Balance Overall balance assessment:  (unsupported sitting not assessed 2/2 lethargy)                                           Pertinent Vitals/Pain Pain Assessment: Faces Faces Pain Scale: No hurt    Home Living Family/patient expects to be discharged to:: Private residence Living Arrangements: Spouse/significant other Available Help at Discharge: Family;Available 24 hours/day;Friend(s) Type of Home: House Home Access: Stairs to enter Entrance Stairs-Rails: None Entrance Stairs-Number of Steps: 1 Home Layout: One level Home Equipment: None Additional Comments: Mother and sister are both CNAs - sister works at [employer] on ?65M 7a-7p    Prior Function Level of Independence: Independent         Comments: Pt was fully independent.  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 PROM elbow distally WFL, and shoulder to ~90* - limited by ETT  LUE Coordination: decreased fine motor;decreased gross motor    Lower Extremity Assessment Lower Extremity Assessment: Defer to PT evaluation RLE Deficits / Details: PROM WFL, pt with rigidity present initially but becomes more fluid with progressive PROM. No AROM noted in BLE LLE Deficits / Details:  PROM WFL, pt with rigidity present initially but becomes more fluid with progressive PROM. No AROM noted in BLE    Cervical / Trunk Assessment Cervical / Trunk Assessment: Other exceptions Cervical / Trunk Exceptions: head and neck rotated to the Lt (vent on the Left   Communication   Communication: Other (comment) (ETT)  Cognition Arousal/Alertness: Lethargic Behavior During Therapy: Flat affect Overall Cognitive Status: Impaired/Different from baseline                                 General Comments: Pt with eyes closed throughout session.  He demonstrated only minimal withdrawal of Rt UE to painful stimuli.  Does not follow command or respond to auditory or visual stimulation       General Comments General comments (skin integrity, edema, etc.): VSS throughout session.  Wife and mother present     Exercises     Assessment/Plan    PT Assessment Patient needs continued PT services  PT Problem List Decreased strength;Decreased activity tolerance;Decreased balance;Decreased mobility;Decreased cognition;Decreased knowledge of use of DME;Decreased safety awareness;Decreased knowledge of precautions;Cardiopulmonary status limiting activity       PT Treatment Interventions DME instruction;Gait training;Stair training;Functional mobility training;Therapeutic activities;Therapeutic exercise;Balance training;Neuromuscular re-education;Cognitive remediation;Patient/family education;Wheelchair mobility training    PT Goals (Current goals can be found in the Care Plan section)  Acute Rehab PT Goals Patient Stated Goal: pt unable.  Spouse is hopeful he will return to baseline  PT Goal Formulation: With family Time For Goal Achievement: 12/24/19 Potential to Achieve Goals: Good    Frequency Min 4X/week (hopeful for progression of mobility)   Barriers to discharge        Co-evaluation PT/OT/SLP Co-Evaluation/Treatment: Yes Reason for Co-Treatment: Complexity of the  patient's impairments (multi-system involvement);Necessary to address cognition/behavior during functional activity PT goals addressed during session: Strengthening/ROM         AM-PAC PT "6 Clicks" Mobility  Outcome Measure Help needed turning from your back to your side while in a flat bed without using bedrails?: Total Help needed moving from lying on your back to sitting on the side of a flat bed without using bedrails?: Total Help needed moving to and from a bed to a chair (including a wheelchair)?: Total Help needed standing up from a chair using your arms (e.g., wheelchair or bedside chair)?: Total Help needed to walk in hospital room?: Total Help needed climbing 3-5 steps with a railing? : Total 6 Click Score: 6    End of Session   Activity Tolerance: Patient limited by lethargy Patient left: in bed;with call bell/phone within reach;with family/visitor present Nurse Communication: Mobility status;Need for lift equipment PT Visit Diagnosis: Other abnormalities of gait and mobility (R26.89);Muscle weakness (generalized) (M62.81);Other symptoms and signs involving the nervous system (R29.898);Hemiplegia and hemiparesis Hemiplegia - Right/Left: Left Hemiplegia - caused by: Nontraumatic intracerebral hemorrhage    Time: 0254-2706 PT Time Calculation (min) (ACUTE ONLY): 21 min   Charges:   PT Evaluation $PT  Eval Moderate Complexity: 1 Mod          Zenaida Niece, PT, DPT Acute Rehabilitation Pager: [phone]   Zenaida Niece 12/10/2019, 1:25 PM

## 2019-12-10 NOTE — Evaluation (Signed)
Occupational Therapy Evaluation Patient Details Name: Dennis Mercado MRN: [id number] DOB: Jul 17, 1980 Today's Date: 12/10/2019    History of Present Illness 39 y.o. male with a PMHx of HTN who presents via EMS as a Code Stroke after acute onset of left sided weakness and headache within minutes of finishing coitus with his wife. CT head reveals an acute hemorrhage centered in the right thalamus, extending into the ventricles, with local mass effect without midline shift. Pt required intubation 6/22 due to ARF secondary to aspiration PNA. PMH non contributory    Clinical Impression   Pt seen in conjunction with PT for limited evaluation.  He remains intubated and on minimal sedation per RN.  PT did not follow any commands, and kept eyes closed throughout session.  He did withdrawal to pain with Rt UE, but no other spontaneous movement or interaction noted.  He currently requires total A for all aspects of ADLs and functional mobility.  He lives with his spouse and was fully independent PTA.  Given his age and independence PTA, anticipate he will be an excellent candidate for CIR once activity level and arousal improve.  Will follow.     Follow Up Recommendations  CIR    Equipment Recommendations  None recommended by OT    Recommendations for Other Services       Precautions / Restrictions Precautions Precautions: Fall Precaution Comments: Lt hemiparesis      Mobility Bed Mobility Overal bed mobility: Needs Assistance Bed Mobility: Rolling Rolling: Total assist         General bed mobility comments: Pt unable to assist   Transfers                 General transfer comment: unable to attempt     Balance                                           ADL either performed or assessed with clinical judgement   ADL Overall ADL's : Needs assistance/impaired Eating/Feeding: NPO   Grooming: Total assistance;Bed level   Upper Body Bathing: Total  assistance;Bed level   Lower Body Bathing: Total assistance;Bed level   Upper Body Dressing : Total assistance;Bed level   Lower Body Dressing: Total assistance;Bed level   Toilet Transfer: Total assistance Toilet Transfer Details (indicate cue type and reason): unable  Toileting- Clothing Manipulation and Hygiene: Total assistance;Bed level       Functional mobility during ADLs: Total assistance General ADL Comments: Pt unable to engage in any ADL activities      Vision   Additional Comments: keeps eyes closed and does not open them despite max cues.  If eyelids manually opened, he does demonstrates dysconjugate gaze with Lt eye exotropic      Perception Perception Perception Tested?: No   Praxis Praxis Praxis tested?: Not tested    Pertinent Vitals/Pain Pain Assessment: Faces Faces Pain Scale: No hurt     Hand Dominance Right   Extremity/Trunk Assessment Upper Extremity Assessment Upper Extremity Assessment: LUE deficits/detail;RUE deficits/detail RUE Deficits / Details: Pt with withdrawal to pain, and minimal movement of Rt UE noted in response to stimulation, but family reports he has been actively moving Rt UE  LUE Deficits / Details: No spontaneous movement noted. PROM elbow distally WFL, and shoulder to ~90* - limited by ETT  LUE Coordination: decreased fine motor;decreased gross motor   Lower Extremity  Assessment Lower Extremity Assessment: Defer to PT evaluation   Cervical / Trunk Assessment Cervical / Trunk Assessment: Other exceptions Cervical / Trunk Exceptions: head and neck rotated to the Lt (vent on the Left    Communication Communication Communication: Other (comment) (ETT)   Cognition Arousal/Alertness: Lethargic   Overall Cognitive Status: Impaired/Different from baseline                                 General Comments: Pt with eyes closed throughout session.  He demonstrated only minimal withdrawal of Rt UE to painful stimuli.   Does not follow command or respond to auditory or visual stimulation    General Comments  VSS throughout session.  Wife and mother present     Exercises     Shoulder Instructions      Home Living Family/patient expects to be discharged to:: Private residence Living Arrangements: Spouse/significant other Available Help at Discharge: Family;Available 24 hours/day;Friend(s) Type of Home: House Home Access: Stairs to enter [employer] of Steps: 1 Entrance Stairs-Rails: None Home Layout: One level     Bathroom Shower/Tub: Teacher, early years/pre: Standard     Home Equipment: None   Additional Comments: Mother and sister are both CNAs - sister works at [employer] on ?46M 7a-7p      Prior Functioning/Environment Level of Independence: Independent        Comments: Pt was fully independent.  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[employer] List: Decreased strength;Decreased range of motion;Decreased activity tolerance;Impaired balance (sitting and/or standing);Impaired vision/perception;Decreased cognition;Decreased coordination;Decreased safety awareness;Decreased knowledge of precautions;Decreased knowledge of use of DME or AE;Cardiopulmonary status limiting activity;Impaired UE functional use;Impaired tone      OT Treatment/Interventions: Self-care/ADL training;Neuromuscular education;DME and/or AE instruction;Manual therapy;Splinting;Therapeutic activities;Cognitive remediation/compensation;Visual/perceptual remediation/compensation;Patient/family education;Balance training    OT Goals(Current goals can be found in the care plan section) Acute Rehab OT Goals Patient Stated Goal: pt unable.  Spouse is hopeful he will return to baseline  OT Goal Formulation: With family Time For Goal Achievement: 12/24/19 Potential to Achieve Goals: Good ADL Goals Pt Will Perform Grooming: with mod assist;sitting Pt Will Perform Upper Body Bathing: with mod  assist;sitting Additional ADL Goal #1: Pt will follow simple commands ~50% of the time during ADLs Additional ADL Goal #2: Pt will sustain attention to familiar ADL task x 2 mins with min cues  OT Frequency: Min 2X/week   Barriers to D/C:            Co-evaluation              AM-PAC OT "6 Clicks" Daily Activity     Outcome Measure Help from another person eating meals?: Total Help from another person taking care of personal grooming?: Total Help from another person toileting, which includes using toliet, bedpan, or urinal?: Total Help from another person bathing (including washing, rinsing, drying)?: Total Help from another person to put on and taking off regular upper body clothing?: Total Help from another person to put on and taking off regular lower body clothing?: Total 6 Click Score: 6   End of Session Equipment Utilized During Treatment: Oxygen Nurse Communication: Mobility status  Activity Tolerance: Patient limited by lethargy Patient left: in bed;with call bell/phone within reach;with family/visitor present  OT Visit Diagnosis: Cognitive communication deficit (R41.841);Hemiplegia and hemiparesis Symptoms and signs involving cognitive functions: Nontraumatic intracerebral hemorrhage Hemiplegia - Right/Left:  Left Hemiplegia - dominant/non-dominant: Non-Dominant Hemiplegia - caused by: Nontraumatic intracerebral hemorrhage                Time: 1032-1056 OT Time Calculation (min): 24 min Charges:  OT General Charges $OT Visit: 1 Visit OT Evaluation $OT Eval High Complexity: 1 High  Nilsa Nutting., OTR/L Acute Rehabilitation Services Pager [phone] Office [phone]   Lucille Passy M 12/10/2019, 12:12 PM

## 2019-12-10 NOTE — Progress Notes (Signed)
eLink Physician-Brief Progress Note Patient Name: Dennis Mercado DOB: Oct 01, 1980 MRN: [id number]   Date of Service  12/10/2019  HPI/Events of Note  Patient needs a.m. labs ordered.  eICU Interventions   a.m. labs ordered.        [name] 12/10/2019, 4:11 AM

## 2019-12-10 NOTE — Progress Notes (Addendum)
NAME:  Dennis Mercado, MRN:  [id number], DOB:  July 11, 1980, LOS: 4 ADMISSION DATE:  12/06/2019, CONSULTATION DATE: 12/07/2018 REFERRING MD: Neurology, CHIEF COMPLAINT: Status post intracerebral hemorrhage, hypoxia respiratory distress.  Brief History   39 year old with acute onset of left-sided weakness right thalami bleed admitted to the hospital for further evaluation and treatment.  History of present illness   39 year old male following intercourse with his wife felt ill and had left-sided weakness.  Brought to the hospital found to have a right thalami bleed which time neurology admitted him to the [facility] intensive care unit.  His mental status declined his respiratory status declined pulmonary critical care was called to bedside and found him tachypneic agitated and hypoxic.  He was electively intubated after discussions with him so would be able to travel to CT scan safely.  He also had a central line placed for 3% saline.  He may need interventricular drain plus or minus surgical interventions per neurosurgery.  Pulmonary critical care asked to consult he was intubated central line placed he is hemodynamically stable and being transferred to x-ray for further treatment evaluation.  Past Medical History  Noncompliant with medications hypertension  Significant Hospital Events   Intubated 12/07/2019  Consults:  12/07/2019 neurosurgery 12/07/2019 CCM  Procedures:  12/07/2019 intubation 12/07/2018 with left internal jugular central line placed  Significant Diagnostic Tests:  CT of the head with right ophthalmic bleeding  Micro Data:  6/22 fob>> no orgs seen  Antimicrobials:  6/22 unasyn>>  Interim history/subjective:  Pulm mechanics good on PSV/CPAP this morning Agitation+ hypertension requiring PRN antihypertensives and analgesia  Objective   Blood pressure (!) 164/118, pulse (!) 110, temperature 98.6 F (37 C), resp. rate 14, height 5\' 7"  (1.702 m), weight 75.1 kg, SpO2 100 %.      Vent Mode: PSV;CPAP FiO2 (%):  [40 %-60 %] 40 % Set Rate:  [18 bmp] 18 bmp Vt Set:  [520 mL] 520 mL PEEP:  [5 cmH20] 5 cmH20 Pressure Support:  [8 cmH20] 8 cmH20 Plateau Pressure:  [17 cmH20-19 cmH20] 18 cmH20   Intake/Output Summary (Last 24 hours) at 12/10/2019 0921 Last data filed at 12/10/2019 0000 Gross per 24 hour  Intake 2289.84 ml  Output 2200 ml  Net 89.84 ml   Filed Weights   12/06/19 0455 12/06/19 0940  Weight: 75.5 kg 75.1 kg    Examination: General: WDWN critically ill appearing young adult, intubated sedate NAD  HENT: NCAT ETT secure pink mmm trachea midline Lungs: Diminished bibasilar sounds. Symmetrical chest expansion. No accessory muscle use on PSV/CPAP Cardiovascular: RRR s1s2 no rgm cap refill < 3 seconds 2+ peripheral pulses  Abdomen: soft flat ndnt + bowel sounds x4 Extremities: Non-pitting edema BUE BLE No obvious joint deformity   Neuro: Sedated, awakens to voice, following commands on R. PERRLA 68mm  GU: WNL  Resolved Hospital Problem list     Assessment & Plan:   Acute Hypoxemic Respiratory Failure, LLL Aspiration pneumonia, dense consolidation  P - cont PSV/CPAP weans - will diurese today, try to optimize pulm status for hopeful extubation  - Adding precedex in hopes of weaning fent/prop  - unasyn x 7 days  Acute right thalamus intracranial hemorrhage on 12/07/2019 P - CT and  MRI stable  - hypertonic per neuro for goal Na 150-155  - q6 Na checks   Poorly controlled hypertension P - Goal SBP < 140 - increasing antihypertensive medications - will diurese as well, suspect volume overload contributing to HTN   Best practice:  Diet: NPO Pain/Anxiety/Delirium protocol (if indicated): precedex, weaning prop and fent VAP protocol (if indicated): Yes  DVT prophylaxis: Mechanical only  GI prophylaxis: PPI Glucose control: cbgs with ssi  Mobility: Bedrest Code Status: Full Family Communication: Per primary team  Disposition: ICU   Labs    CBC: Recent Labs  Lab 12/06/19 0422 12/06/19 0428 12/07/19 0519 12/07/19 0714 12/07/19 0906 12/08/19 0359 12/08/19 0518 12/09/19 0444 12/10/19 0423  WBC 11.6*  --  20.6*  --   --   --  12.8* 15.7* 11.4*  NEUTROABS 6.7  --   --   --   --   --  10.4*  --  9.0*  HGB 14.7   < > 13.4   < > 12.9* 11.9* 12.4* 13.1 13.2  HCT 45.9   < > 42.3   < > 38.0* 35.0* 41.8 44.1 43.7  MCV 85.6  --  85.8  --   --   --  91.7 90.4 89.2  PLT 351  --  323  --   --   --  265 279 275   < > = values in this interval not displayed.    Basic Metabolic Panel: Recent Labs  Lab 12/06/19 0422 12/06/19 0422 12/06/19 0428 12/06/19 1203 12/07/19 0519 12/07/19 0714 12/07/19 0906 12/07/19 1110 12/08/19 0359 12/08/19 0518 12/08/19 1024 12/08/19 1711 12/09/19 0006 12/09/19 0444 12/09/19 1030 12/09/19 1635 12/09/19 2157 12/10/19 0423  NA 140   < > 142   < > 147*   < > 151*   < > 160* 156*   < > 151*   < > 150* 151* 153* 155* 155*  K 3.2*   < > 2.9*  --  4.2   < > 4.1  --  4.2 4.2  --   --   --  3.8  --   --   --  3.7  CL 105   < > 102  --  120*  --   --   --   --  126*  --   --   --  113*  --   --   --  119*  CO2 23  --   --   --  17*  --   --   --   --  24  --   --   --  27  --   --   --  26  GLUCOSE 183*   < > 183*  --  137*  --   --   --   --  95  --   --   --  146*  --   --   --  94  BUN 14   < > 15  --  12  --   --   --   --  13  --   --   --  15  --   --   --  22*  CREATININE 1.52*   < > 1.40*  --  1.44*  --   --   --   --  1.57*  --   --   --  1.49*  --   --   --  1.51*  CALCIUM 8.9  --   --   --  8.1*  --   --   --   --  8.3*  --   --   --  9.0  --   --   --  8.7*  MG  --   --   --    < >  --   --   --   --   --  2.0  --  1.8  --  2.0  --  2.2  --  2.1  PHOS  --   --   --   --   --   --   --   --   --  3.3  --  3.4  --  4.6  --  3.9  --  3.1   < > = values in this interval not displayed.   GFR: Estimated Creatinine Clearance: 62 mL/min (A) (by C-G formula based on SCr of 1.51 mg/dL  (H)). Recent Labs  Lab 12/07/19 0519 12/08/19 0518 12/09/19 0444 12/10/19 0423  WBC 20.6* 12.8* 15.7* 11.4*    Liver Function Tests: Recent Labs  Lab 12/06/19 0422  AST 25  ALT 22  ALKPHOS 57  BILITOT 0.5  PROT 6.9  ALBUMIN 4.1   No results for input(s): LIPASE, AMYLASE in the last 168 hours. No results for input(s): AMMONIA in the last 168 hours.  ABG    Component Value Date/Time   PHART 7.316 (L) 12/08/2019 0359   PCO2ART 48.1 (H) 12/08/2019 0359   PO2ART 146 (H) 12/08/2019 0359   HCO3 24.4 12/08/2019 0359   TCO2 26 12/08/2019 0359   ACIDBASEDEF 2.0 12/08/2019 0359   O2SAT 99.0 12/08/2019 0359     Coagulation Profile: Recent Labs  Lab 12/06/19 0422  INR 0.9    Cardiac Enzymes: No results for input(s): CKTOTAL, CKMB, CKMBINDEX, TROPONINI in the last 168 hours.  HbA1C: Hgb A1c MFr Bld  Date/Time Value Ref Range Status  12/06/2019 12:03 PM 5.6 4.8 - 5.6 % Final    Comment:    (NOTE)         Prediabetes: 5.7 - 6.4         Diabetes: >6.4         Glycemic control for adults with diabetes: <7.0     CBG: Recent Labs  Lab 12/09/19 1526 12/09/19 1951 12/09/19 2355 12/10/19 0452 12/10/19 0753  GLUCAP 114* 125* 86 101* 98    CRITICAL CARE Performed by: Cristal Generous  Total critical care time: 46 minutes  Critical care time was exclusive of separately billable procedures and treating other patients. Critical care was necessary to treat or prevent imminent or life-threatening deterioration.  Critical care was time spent personally by me on the following activities: development of treatment plan with patient and/or surrogate as well as nursing, discussions with consultants, evaluation of patient's response to treatment, examination of patient, obtaining history from patient or surrogate, ordering and performing treatments and interventions, ordering and review of laboratory studies, ordering and review of radiographic studies, pulse oximetry and  re-evaluation of patient's condition.  Eliseo Gum MSN, AGACNP-BC [place] [phone] If no answer, [phone] 12/10/2019, 9:21 AM     PCCM:  39 yo right thalamic ICH, developed LLL asp pna, intubated, uncontrolled HTN was on cardene and several orals  Passing sbt sat this AM. He was liberated from vent. .  Mid-afternoon patient was combative and suffered a seizure. He was given ativan which stopped it. He was loaded with depakote.   BP (!) 179/116 Comment: see MAR  Pulse (!) 102   Temp (!) 100.8 F (38.2 C)   Resp (!) 21   Ht 5\' 7"  (1.702 m)   Wt 75.1 kg   SpO2 98%   BMI 25.93 kg/m   Gen: young male, on life support, intubated  HENT: ETT in place  Heart: RRR s1 s2 Lungs: BL vented breaths  Labs reviewed   A: Right thalamic ICH AHRF, intubated on MV  Acute seizure LLL aspiration pneumonia  P: EEG pending  Neurology called and updated regarding change Post-ictal now, obs in ICU, did not need to be re-intubated  BP control  Increased orals but not sure he can take now May need a cortrac  Continue unasyn   This patient is critically ill with multiple organ system failure; which, requires frequent high complexity decision making, assessment, support, evaluation, and titration of therapies. This was completed through the application of advanced monitoring technologies and extensive interpretation of multiple databases. During this encounter critical care time was devoted to patient care services described in this note for 32 minutes.  Garner Nash, DO [facility] 12/10/2019 5:40 PM
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
## 2019-12-10 NOTE — Procedures (Signed)
Extubation Procedure Note  Patient Details:   Name: Dennis Mercado DOB: 04-27-1981 MRN: [id number]   Airway Documentation:    Vent end date: 12/10/19 Vent end time: 1310   Evaluation  O2 sats: stable throughout Complications: No apparent complications Patient did tolerate procedure well. Bilateral Breath Sounds: Clear   Yes   Patient was extubated to a 4L [place] without any complications, dyspnea or stridor noted. Positive cuff leak prior to extubation.   Dennis Mercado L 12/10/2019, 1:10 PM
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
## 2019-12-11 ENCOUNTER — Inpatient Hospital Stay (HOSPITAL_COMMUNITY): Payer: BC Managed Care – PPO

## 2019-12-11 DIAGNOSIS — R569 Unspecified convulsions: Secondary | ICD-10-CM

## 2019-12-11 DIAGNOSIS — G936 Cerebral edema: Secondary | ICD-10-CM

## 2019-12-11 DIAGNOSIS — J9602 Acute respiratory failure with hypercapnia: Secondary | ICD-10-CM

## 2019-12-11 DIAGNOSIS — E876 Hypokalemia: Secondary | ICD-10-CM

## 2019-12-11 DIAGNOSIS — I160 Hypertensive urgency: Secondary | ICD-10-CM

## 2019-12-11 DIAGNOSIS — I615 Nontraumatic intracerebral hemorrhage, intraventricular: Principal | ICD-10-CM

## 2019-12-11 DIAGNOSIS — J9600 Acute respiratory failure, unspecified whether with hypoxia or hypercapnia: Secondary | ICD-10-CM

## 2019-12-11 DIAGNOSIS — N179 Acute kidney failure, unspecified: Secondary | ICD-10-CM

## 2019-12-11 DIAGNOSIS — R509 Fever, unspecified: Secondary | ICD-10-CM

## 2019-12-11 LAB — BASIC METABOLIC PANEL
Anion gap: 17 — ABNORMAL HIGH (ref 5–15)
BUN: 28 mg/dL — ABNORMAL HIGH (ref 6–20)
CO2: 24 mmol/L (ref 22–32)
Calcium: 8.6 mg/dL — ABNORMAL LOW (ref 8.9–10.3)
Chloride: 116 mmol/L — ABNORMAL HIGH (ref 98–111)
Creatinine, Ser: 1.74 mg/dL — ABNORMAL HIGH (ref 0.61–1.24)
GFR calc Af Amer: 56 mL/min — ABNORMAL LOW (ref >60)
GFR calc non Af Amer: 49 mL/min — ABNORMAL LOW (ref >60)
Glucose, Bld: 105 mg/dL — ABNORMAL HIGH (ref 70–99)
Potassium: 3 mmol/L — ABNORMAL LOW (ref 3.5–5.1)
Sodium: 157 mmol/L — ABNORMAL HIGH (ref 135–145)

## 2019-12-11 LAB — SODIUM
Sodium: 158 mmol/L — ABNORMAL HIGH (ref 135–145)
Sodium: 158 mmol/L — ABNORMAL HIGH (ref 135–145)
Sodium: 155 mmol/L — ABNORMAL HIGH (ref 135–145)

## 2019-12-11 LAB — RAPID URINE DRUG SCREEN, HOSP PERFORMED
Amphetamines: NOT DETECTED
Barbiturates: NOT DETECTED
Benzodiazepines: POSITIVE — AB
Cocaine: NOT DETECTED
Opiates: NOT DETECTED
Tetrahydrocannabinol: NOT DETECTED

## 2019-12-11 LAB — URINALYSIS, COMPLETE (UACMP) WITH MICROSCOPIC
Bacteria, UA: NONE SEEN
Bilirubin Urine: NEGATIVE
Glucose, UA: NEGATIVE mg/dL
Hgb urine dipstick: NEGATIVE
Ketones, ur: 20 mg/dL — AB
Leukocytes,Ua: NEGATIVE
Nitrite: NEGATIVE
Protein, ur: NEGATIVE mg/dL
Specific Gravity, Urine: 1.017 (ref 1.005–1.030)
pH: 6 (ref 5.0–8.0)

## 2019-12-11 LAB — GLUCOSE, CAPILLARY
Glucose-Capillary: 101 mg/dL — ABNORMAL HIGH (ref 70–99)
Glucose-Capillary: 111 mg/dL — ABNORMAL HIGH (ref 70–99)
Glucose-Capillary: 109 mg/dL — ABNORMAL HIGH (ref 70–99)
Glucose-Capillary: 95 mg/dL (ref 70–99)
Glucose-Capillary: 92 mg/dL (ref 70–99)
Glucose-Capillary: 100 mg/dL — ABNORMAL HIGH (ref 70–99)
Glucose-Capillary: 106 mg/dL — ABNORMAL HIGH (ref 70–99)
Glucose-Capillary: 104 mg/dL — ABNORMAL HIGH (ref 70–99)
Glucose-Capillary: 114 mg/dL — ABNORMAL HIGH (ref 70–99)
Glucose-Capillary: 107 mg/dL — ABNORMAL HIGH (ref 70–99)

## 2019-12-11 MED ORDER — CLEVIDIPINE BUTYRATE 0.5 MG/ML IV EMUL
0.0000 mg/h | INTRAVENOUS | Status: DC
  Administered 2019-12-11 (×2): 1 mg/h via INTRAVENOUS
  Administered 2019-12-11 – 2019-12-12 (×5): 10 mg/h via INTRAVENOUS
  Administered 2019-12-12 (×2): 8 mg/h via INTRAVENOUS
  Administered 2019-12-12: 10 mg/h via INTRAVENOUS
  Filled 2019-12-11 (×12): qty 50

## 2019-12-11 MED ORDER — CARVEDILOL 12.5 MG PO TABS
25.0000 mg | ORAL_TABLET | Freq: Two times a day (BID) | ORAL | Status: DC
  Administered 2019-12-11 – 2019-12-15 (×8): 25 mg via ORAL
  Filled 2019-12-11 (×8): qty 2

## 2019-12-11 MED ORDER — SPIRONOLACTONE 25 MG PO TABS
25.0000 mg | ORAL_TABLET | Freq: Every day | ORAL | Status: DC
  Filled 2019-12-11: qty 1

## 2019-12-11 MED ORDER — SODIUM CHLORIDE 0.9 % IV SOLN: INTRAVENOUS | Status: DC | PRN

## 2019-12-11 MED ORDER — HYDRALAZINE HCL 50 MG PO TABS
50.0000 mg | ORAL_TABLET | Freq: Three times a day (TID) | ORAL | Status: DC
  Administered 2019-12-11 – 2019-12-12 (×4): 50 mg via ORAL
  Filled 2019-12-11 (×4): qty 1

## 2019-12-11 MED ORDER — POTASSIUM CHLORIDE 10 MEQ/50ML IV SOLN
10.0000 meq | INTRAVENOUS | Status: AC
  Administered 2019-12-11 (×6): 10 meq via INTRAVENOUS
  Filled 2019-12-11 (×6): qty 50

## 2019-12-11 MED ORDER — HEPARIN SODIUM (PORCINE) 5000 UNIT/ML IJ SOLN
5000.0000 [IU] | Freq: Three times a day (TID) | INTRAMUSCULAR | Status: DC
  Administered 2019-12-11 – 2019-12-15 (×12): 5000 [IU] via SUBCUTANEOUS
  Filled 2019-12-11 (×12): qty 1

## 2019-12-11 MED ORDER — HYDROCHLOROTHIAZIDE 25 MG PO TABS
25.0000 mg | ORAL_TABLET | Freq: Every day | ORAL | Status: DC
  Administered 2019-12-12: 25 mg via ORAL
  Filled 2019-12-11: qty 1

## 2019-12-11 MED ORDER — WHITE PETROLATUM EX OINT
TOPICAL_OINTMENT | CUTANEOUS | Status: AC
  Filled 2019-12-11: qty 28.35

## 2019-12-11 NOTE — Progress Notes (Signed)
Spoke with April Ganesh about applying soft waist restraint due to patent trying to get out of bed.

## 2019-12-11 NOTE — Progress Notes (Signed)
STROKE TEAM PROGRESS NOTE   INTERVAL HISTORY RN, wife and daughter are at the bedside. Pt awake alert, eyes open, fully orientated. Dysarthria, NG tube was put out by pt and now NPO. Will call speech consult. Na 158, change 3% to NS. Still has fever and elevated Cre, will do pan culture and CXR. Continue unasyn. Still has left hemiplegia. EEG no seizure, on depakote.    Vitals:   12/11/19 0300 12/11/19 0400 12/11/19 0500 12/11/19 0600  BP: 117/76 (!) 137/94 (!) 134/95 (!) 128/91  Pulse: (!) 114 (!) 134 (!) 116 (!) 112  Resp: (!) 24 (!) 25 (!) 24 (!) 24  Temp:  (!) 100.8 F (38.2 C)    TempSrc:  Oral    SpO2: 96% 95% 95% 96%  Weight:      Height:       CBC:  Recent Labs  Lab 12/08/19 0518 12/08/19 0518 12/09/19 0444 12/10/19 0423  WBC 12.8*   < > 15.7* 11.4*  NEUTROABS 10.4*  --   --  9.0*  HGB 12.4*   < > 13.1 13.2  HCT 41.8   < > 44.1 43.7  MCV 91.7   < > 90.4 89.2  PLT 265   < > 279 275   < > = values in this interval not displayed.   Basic Metabolic Panel:  Recent Labs  Lab 12/09/19 1635 12/09/19 2157 12/10/19 0423 12/10/19 1020 12/10/19 1632 12/11/19 0326  NA 153*   < > 155*   < > 150* 157*  K  --   --  3.7  --   --  3.0*  CL  --   --  119*  --   --  116*  CO2  --   --  26  --   --  24  GLUCOSE  --   --  94  --   --  105*  BUN  --   --  22*  --   --  28*  CREATININE  --   --  1.51*  --   --  1.74*  CALCIUM  --   --  8.7*  --   --  8.6*  MG 2.2  --  2.1  --   --   --   PHOS 3.9  --  3.1  --   --   --    < > = values in this interval not displayed.   Lipid Panel:     Component Value Date/Time   CHOL 232 (H) 12/06/2019 1203   TRIG 486 (H) 12/10/2019 0814   HDL 42 12/06/2019 1203   CHOLHDL 5.5 12/06/2019 1203   VLDL UNABLE TO CALCULATE IF TRIGLYCERIDE OVER 400 mg/dL 12/06/2019 1203   LDLCALC UNABLE TO CALCULATE IF TRIGLYCERIDE OVER 400 mg/dL 12/06/2019 1203   HgbA1c:  Lab Results  Component Value Date   HGBA1C 5.6 12/06/2019   Urine Drug Screen:      Component Value Date/Time   LABOPIA NONE DETECTED 12/06/2019 1200   COCAINSCRNUR NONE DETECTED 12/06/2019 1200   LABBENZ NONE DETECTED 12/06/2019 1200   AMPHETMU NONE DETECTED 12/06/2019 1200   THCU POSITIVE (A) 12/06/2019 1200   LABBARB NONE DETECTED 12/06/2019 1200    Alcohol Level     Component Value Date/Time   ETH <10 12/06/2019 1203    IMAGING past 24 hours EEG  Result Date: 12/10/2019 Lora Havens, MD     12/10/2019  4:52 PM Patient Name: Vikas Wegmann MRN: [id number] Epilepsy Attending:  Lora Havens Referring Physician/Provider: Eliseo Gum, NP Date: 12/10/2019 Duration: 23.13 mins Patient history: 39 year old male with right thalamic ICH with IVH.  Today patient was noted to have an episode where he had left-sided contraction which eventually spread to right side contraction, upper and lower extremity rigidity with rhythmic contraction as well as upward gaze concerning for seizures.  EEG evaluate for seizures. Level of alertness: comatose/ sedated AEDs during EEG study: Ativan, Depakote Technical aspects: This EEG study was done with scalp electrodes positioned according to the 10-20 International system of electrode placement. Electrical activity was acquired at a sampling rate of _0  and reviewed with a high frequency filter of _1  and a low frequency filter of _2 . EEG data were recorded continuously and digitally stored. Description: EEG showed continuous low amplitude 2 to 3 Hz delta slowing.  EEG was reactive to tactile stimulation. Hyperventilation and photic stimulation were not performed.   ABNORMALITY -Continuous slow, generalized IMPRESSION: This study is suggestive of severe diffuse encephalopathy, nonspecific to etiology but most likely related to sedation.  No seizures or epileptiform discharges were seen throughout the recording. Lora Havens   DG Abd 1 View  Result Date: 12/10/2019 CLINICAL DATA:  NG tube placement EXAM: ABDOMEN - 1 VIEW COMPARISON:   12/10/2019 FINDINGS: Partially visualized central venous catheter with the tip projecting over the SVC. No esophageal catheter is visible on the current image. IMPRESSION: No esophageal catheter is visualized on the current images. These results will be called to the ordering clinician or representative by the Radiologist Assistant, and communication documented in the PACS or [employer]. Electronically Signed   By: Donavan Foil M.D.   On: 12/10/2019 19:27   DG Abd 1 View  Result Date: 12/10/2019 CLINICAL DATA:  Orogastric tube placement. EXAM: ABDOMEN - 1 VIEW COMPARISON:  December 07, 2019. FINDINGS: The bowel gas pattern is normal. Enteric tube tip is seen in proximal stomach. No radio-opaque calculi or other significant radiographic abnormality are seen. IMPRESSION: Enteric tube tip seen in proximal stomach. No evidence of bowel obstruction or ileus. Electronically Signed   By: Marijo Conception M.D.   On: 12/10/2019 08:45     MRI WO Contrast  12/08/19 IMPRESSION: 1. Unchanged size of right thalamic intraparenchymal hematoma with mild surrounding edema. No hydrocephalus or ventricular entrapment. 2. 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  Bilateral Carotid Dopplers  12/08/19 Summary:  Right Carotid: Velocities in the right ICA are consistent with a 1-39% stenosis.  Left Carotid: Velocities in the visualized segment of the left ICA are consistent with a 1-39% stenosis.  Vertebrals: Right vertebral artery demonstrates antegrade flow. Left vertebral artery was not visualized.  Subclavians: Left subclavian artery was not visualized. Normal flow hemodynamics were seen in the right subclavian artery.   Portable CXR 12/08/19 IMPRESSION: Stable support apparatus. Stable large left lung opacity concerning for pneumonia. Improved right basilar opacity is noted. No significant pleural effusion is noted.  PHYSICAL EXAM  Temp:  [99.8 F (37.7 C)-102.9 F  (39.4 C)] 101.8 F (38.8 C) (06/26 0800) Pulse Rate:  [85-138] 116 (06/26 0900) Resp:  [12-37] 23 (06/26 0900) BP: (97-179)/(66-116) 148/93 (06/26 0900) SpO2:  [91 %-99 %] 97 % (06/26 0900) FiO2 (%):  [40 %] 40 % (06/25 1145)  General - Well nourished, well developed, mild lethargy.  Ophthalmologic - fundi not visualized due to noncooperation.  Cardiovascular - Regular rhythm and tachycardia.  Neuro - Level of arousal and orientation to time, place, and person  were intact. Language exam showed paucity of speech, able to repeat, naming 3/4. Following all simple commands. Right gaze preference but able to cross midline, left hemianopia not blinking to visual threat on the left. Left facial droop. Tongue midline. Left hemiplegia. Right UE and LE at least 4/5. DTR 1+ and no babinski. Sensation subjectively symmetrical. Right FTN intact. Gait not tested.   ASSESSMENT/PLAN Mr. Jalen Daluz is a 39 y.o. male with recent HTN at dentist office with plans for PCP visit next week, presenting post coitus with L sided weakness, elevated BP.   ICH - R thalamic ICH w/ IVH likely hypertensive   CT head R thalamic hemorrhage, 68m. IVH. Local mass effect w/o midline shift  CTA head Unremarkable   CT head at 12h stable  CT head 6/22 0440 stable R thalamic hemorrhage, IVH. Sllight increased in ventricle size. 4-5 midline shift  CT head 6/22 remains stable     MRI 6/23 - Unchanged size of right thalamic ICH with mild surrounding edema. No hydrocephalus or ventricular entrapment.   EEG - severe diffuse encephalopathy  Carotid Doppler - unremarkable  2D Echo EF 60-65%. No source of embolus    LDL 157  HgbA1c 5.6   Heparin subq for VTE prophylaxis  No antithrombotic prior to admission, now on No antithrombotic given hemorrhage   Therapy recommendations: pending   Disposition:  pending   Cerebral Edema   On 3% protocol -> now off  NA 151-153-151-150->155->158  Goal  150-155  Repeat CT stable   MRI unchanged size with mild edema  Now on NS  Na monitoring   Hypoxic Respiratory Failure Aspiration PNA Fever  leukocytosis  Emergently intubated 6/22 -> extubated 6/25  CT chest L consolidation   Bronchoscopy, BAL to LLL  Tmax - 101.8->100.2 ->101.9   WBC - 20.6->12.8->15.7->11.4  Unasyn 6/22>>    CXR - 6/23 - Stable large left lung opacity concerning for pneumonia. Improved right basilar opacity is noted  CXR 6/26 Significant improvement compared to the prior study with near resolution of the previously seen hazy left lung airspace opacity  CCM onboard   Hypertensive Emergency   BP as high as 219/162   Home meds:  None  SBP goal < 160  Now on cleviprex . Added clonidine patch 0.124m(D/C'd 6/24) . Now on - Coreg 25 mg Bid ; Norvasc 10 mg QD ; Hydralazine 50 mg Q8 hrs ; HCTZ 25 mg QD ; spironolactone 25 mg QD (Labetalol prn ; Hydralazine prn) . Long-term BP goal normotensive   Seizure   Agitation following extubation 6/25  L trapezius muscle contractions, extending to R trapezius, with subsequent Upper and lower extremity rigidity with rhythmic contraction. Pt unresponsive, upward deviated gaze. C/w seizure   S/p ativan  On depakote 500 Q6h  EEG severe diffuse encephalopathy  Check depakote level in am  Hyperlipidemia  Home meds: none   LDL 157  Hold statin in setting of acute hemorrhage   Consider statin at d/c or f/u appt  Dysphagia . Secondary to stroke . NPO . Speech on board . Pt removed cortrak -> TF stopped  . On IVF  AKI vs. CKD  Creatinine 1.4-1.57-1.74  On IVF  BMP monitoring  Other Stroke Risk Factors  ETOH use, advised to drink no more than 2 drink(s) a day  Substance abuse - UDS:  THC Positive. Patient will be advised to stop using due to stroke risk.  Other Active Problems  Hypokalemia 3.2-2.9-4.1->3.7 ->3.0 - supplement  Hospital day # 5  This patient is critically ill due to  [place], IVH, cerebral edema, seizure, dysphagia, fever, leukocytosis, AKI, respiratory failure and at significant risk of neurological worsening, death form hematoma expansion, hydrocephalus, cerebral edema, brain herniation, status epilepticus, sepsis. This patient's care requires constant monitoring of vital signs, hemodynamics, respiratory and cardiac monitoring, review of multiple databases, neurological assessment, discussion with family, other specialists and medical decision making of high complexity. I spent 45 minutes of neurocritical care time in the care of this patient. I had long discussion with wife and mom at bedside, updated pt current condition, treatment plan and potential prognosis, and answered all the questions. They expressed understanding and appreciation.    Rosalin Hawking, MD PhD Stroke Neurology 12/11/2019 6:30 PM  To contact Stroke Continuity provider, please refer to [email or web address] After hours, contact General Neurology
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
## 2019-12-11 NOTE — Evaluation (Addendum)
Clinical/Bedside Swallow Evaluation Patient Details  Name: Dennis Mercado MRN: [id number] Date of Birth: 08-Apr-1981  Today's Date: 12/11/2019 Time: SLP Start Time (ACUTE ONLY): 6440 SLP Stop Time (ACUTE ONLY): 1107 SLP Time Calculation (min) (ACUTE ONLY): 26 min  Past Medical History: No past medical history on file. Past Surgical History: No past surgical history on file. HPI:  39 y.o. male with a PMHx of HTN who presents via EMS as a Code Stroke after acute onset of left sided weakness and headache within minutes of finishing coitus with his wife. CT head reveals an acute hemorrhage centered in the right thalamus, extending into the ventricles, with local mass effect without midline shift. Pt required intubation 6/22-6/25 due to ARF felt to be secondary to aspiration PNA.   Assessment / Plan / Recommendation Clinical Impression  Pt presents with L sided facial weakness and reduced sensation, leading to reduced labial seal and buccal clearance. Anterior spillage and moderate oral residue/pocketing are noted without pt awareness, needing Mod cues to regulate. Throat clearing is noted throughout thin liquid trials, turning into coughing as trials continued and oral residue increased from other consistencies. Pt's reduced sensation from a neuro standpoint as well as his intubation with post-extubation dysphonia could put him at increased risk for silent aspiration. Recommend starting ice chips after oral care and meds crushed in puree until MBS can be completed - likely next date at the earliest. Recommend f/u for cognitive evaluation as well - please order if agree.   SLP Visit Diagnosis: Dysphagia, oropharyngeal phase (R13.12)    Aspiration Risk  Moderate aspiration risk    Diet Recommendation NPO except meds;Ice chips PRN after oral care   Medication Administration: Crushed with puree    Other  Recommendations Oral Care Recommendations: Oral care QID Other Recommendations: Have oral  suction available   Follow up Recommendations Inpatient Rehab      Frequency and Duration            Prognosis Prognosis for Safe Diet Advancement: Good Barriers to Reach Goals: Cognitive deficits      Swallow Study   General HPI: 39 y.o. male with a PMHx of HTN who presents via EMS as a Code Stroke after acute onset of left sided weakness and headache within minutes of finishing coitus with his wife. CT head reveals an acute hemorrhage centered in the right thalamus, extending into the ventricles, with local mass effect without midline shift. Pt required intubation 6/22-6/25 due to ARF felt to be secondary to aspiration PNA. Type of Study: Bedside Swallow Evaluation Previous Swallow Assessment: none in chart Diet Prior to this Study: NPO Temperature Spikes Noted: Yes (102.9) History of Recent Intubation: Yes Length of Intubations (days): 3 days Date extubated: 12/10/19 Behavior/Cognition: Alert;Cooperative;Impulsive;Distractible;Requires cueing Oral Cavity Assessment: Dry Oral Care Completed by SLP: Yes Oral Cavity - Dentition: Adequate natural dentition Vision: Functional for self-feeding Self-Feeding Abilities: Able to feed self;Needs assist Patient Positioning: Upright in bed Baseline Vocal Quality: Hoarse Volitional Cough: Cognitively unable to elicit    Oral/Motor/Sensory Function Overall Oral Motor/Sensory Function: Moderate impairment Facial ROM: Reduced left;Suspected CN VII (facial) dysfunction Facial Symmetry: Abnormal symmetry left;Suspected CN VII (facial) dysfunction Facial Strength: Reduced left;Suspected CN VII (facial) dysfunction Facial Sensation: Reduced left;Suspected CN V (Trigeminal) dysfunction Lingual ROM: Reduced left Lingual Symmetry: Within Functional Limits   Ice Chips Ice chips: Impaired Presentation: Spoon Oral Phase Impairments: Reduced labial seal Oral Phase Functional Implications: Left anterior spillage   Thin Liquid Thin Liquid:  Impaired Presentation: Spoon;Self Fed;Straw  Oral Phase Impairments: Reduced labial seal Oral Phase Functional Implications: Left anterior spillage Pharyngeal  Phase Impairments: Throat Clearing - Immediate;Cough - Immediate    Nectar Thick Nectar Thick Liquid: Not tested   Honey Thick Honey Thick Liquid: Not tested   Puree Puree: Impaired Presentation: Self Fed;Spoon Oral Phase Impairments: Reduced labial seal Oral Phase Functional Implications: Left anterior spillage;Left lateral sulci pocketing   Solid     Solid: Impaired Oral Phase Impairments: Reduced labial seal;Poor awareness of bolus Oral Phase Functional Implications: Left anterior spillage;Left lateral sulci pocketing;Oral residue Pharyngeal Phase Impairments: Cough - Immediate      Osie Bond., M.A. [place] Pager [phone] Office [phone]  12/11/2019,12:30 PM

## 2019-12-12 ENCOUNTER — Inpatient Hospital Stay (HOSPITAL_COMMUNITY): Payer: BC Managed Care – PPO

## 2019-12-12 DIAGNOSIS — D72829 Elevated white blood cell count, unspecified: Secondary | ICD-10-CM

## 2019-12-12 DIAGNOSIS — R410 Disorientation, unspecified: Secondary | ICD-10-CM

## 2019-12-12 DIAGNOSIS — R609 Edema, unspecified: Secondary | ICD-10-CM

## 2019-12-12 LAB — CBC
HCT: 44.2 % (ref 39.0–52.0)
Hemoglobin: 13.7 g/dL (ref 13.0–17.0)
MCH: 27 pg (ref 26.0–34.0)
MCHC: 31 g/dL (ref 30.0–36.0)
MCV: 87.2 fL (ref 80.0–100.0)
Platelets: 288 10*3/uL (ref 150–400)
RBC: 5.07 MIL/uL (ref 4.22–5.81)
RDW: 15.5 % (ref 11.5–15.5)
WBC: 11.8 10*3/uL — ABNORMAL HIGH (ref 4.0–10.5)
nRBC: 0.2 % (ref 0.0–0.2)

## 2019-12-12 LAB — GLUCOSE, CAPILLARY
Glucose-Capillary: 109 mg/dL — ABNORMAL HIGH (ref 70–99)
Glucose-Capillary: 131 mg/dL — ABNORMAL HIGH (ref 70–99)
Glucose-Capillary: 132 mg/dL — ABNORMAL HIGH (ref 70–99)
Glucose-Capillary: 106 mg/dL — ABNORMAL HIGH (ref 70–99)
Glucose-Capillary: 96 mg/dL (ref 70–99)
Glucose-Capillary: 119 mg/dL — ABNORMAL HIGH (ref 70–99)

## 2019-12-12 LAB — BASIC METABOLIC PANEL
Anion gap: 10 (ref 5–15)
BUN: 35 mg/dL — ABNORMAL HIGH (ref 6–20)
CO2: 24 mmol/L (ref 22–32)
Calcium: 8.8 mg/dL — ABNORMAL LOW (ref 8.9–10.3)
Chloride: 120 mmol/L — ABNORMAL HIGH (ref 98–111)
Creatinine, Ser: 1.52 mg/dL — ABNORMAL HIGH (ref 0.61–1.24)
GFR calc Af Amer: >60 mL/min (ref >60)
GFR calc non Af Amer: 57 mL/min — ABNORMAL LOW (ref >60)
Glucose, Bld: 130 mg/dL — ABNORMAL HIGH (ref 70–99)
Potassium: 3.6 mmol/L (ref 3.5–5.1)
Sodium: 154 mmol/L — ABNORMAL HIGH (ref 135–145)

## 2019-12-12 LAB — SODIUM
Sodium: 155 mmol/L — ABNORMAL HIGH (ref 135–145)
Sodium: 155 mmol/L — ABNORMAL HIGH (ref 135–145)

## 2019-12-12 LAB — VALPROIC ACID LEVEL: Valproic Acid Lvl: 39 ug/mL — ABNORMAL LOW (ref 50.0–100.0)

## 2019-12-12 MED ORDER — CLONIDINE HCL 0.1 MG PO TABS
0.1000 mg | ORAL_TABLET | Freq: Four times a day (QID) | ORAL | Status: DC
  Administered 2019-12-12 – 2019-12-13 (×5): 0.1 mg via ORAL
  Filled 2019-12-12 (×5): qty 1

## 2019-12-12 MED ORDER — ONDANSETRON 4 MG PO TBDP: 4.0000 mg | ORAL_TABLET | Freq: Four times a day (QID) | ORAL | Status: DC | PRN

## 2019-12-12 MED ORDER — VALPROATE SODIUM 500 MG/5ML IV SOLN
750.0000 mg | Freq: Three times a day (TID) | INTRAVENOUS | Status: AC
  Administered 2019-12-12 – 2019-12-13 (×3): 750 mg via INTRAVENOUS
  Filled 2019-12-12 (×3): qty 7.5

## 2019-12-12 MED ORDER — HALOPERIDOL 0.5 MG PO TABS
2.0000 mg | ORAL_TABLET | Freq: Four times a day (QID) | ORAL | Status: DC | PRN
  Administered 2019-12-14: 2 mg via ORAL
  Filled 2019-12-12 (×2): qty 2

## 2019-12-12 MED ORDER — LEVETIRACETAM 500 MG PO TABS
500.0000 mg | ORAL_TABLET | Freq: Two times a day (BID) | ORAL | Status: DC
  Administered 2019-12-12 – 2019-12-15 (×6): 500 mg via ORAL
  Filled 2019-12-12 (×6): qty 1

## 2019-12-12 MED ORDER — HYDRALAZINE HCL 50 MG PO TABS
100.0000 mg | ORAL_TABLET | Freq: Three times a day (TID) | ORAL | Status: DC
  Administered 2019-12-12 – 2019-12-15 (×9): 100 mg via ORAL
  Filled 2019-12-12 (×9): qty 2

## 2019-12-12 MED ORDER — PANTOPRAZOLE SODIUM 40 MG PO TBEC
40.0000 mg | DELAYED_RELEASE_TABLET | Freq: Every day | ORAL | Status: DC
  Administered 2019-12-12 – 2019-12-15 (×4): 40 mg via ORAL
  Filled 2019-12-12 (×3): qty 1

## 2019-12-12 MED ORDER — CLONIDINE HCL 0.1 MG PO TABS: 0.1000 mg | ORAL_TABLET | ORAL | Status: DC

## 2019-12-12 MED ORDER — CLONIDINE HCL 0.1 MG PO TABS: 0.1000 mg | ORAL_TABLET | Freq: Every day | ORAL | Status: DC

## 2019-12-12 MED ORDER — VALPROATE SODIUM 500 MG/5ML IV SOLN
750.0000 mg | Freq: Three times a day (TID) | INTRAVENOUS | Status: DC
  Administered 2019-12-12: 750 mg via INTRAVENOUS
  Filled 2019-12-12 (×3): qty 7.5

## 2019-12-12 MED ORDER — SODIUM CHLORIDE 0.9 % IV SOLN: INTRAVENOUS | Status: DC | PRN

## 2019-12-12 NOTE — Progress Notes (Signed)
Modified Barium Swallow Progress Note  Patient Details  Name: Dennis Mercado MRN: [id number] Date of Birth: 03/14/1981  Today's Date: 12/12/2019  Modified Barium Swallow completed.  Full report located under Chart Review in the Imaging Section.  Brief recommendations include the following:  Clinical Impression  MBS was completed using thin liquids via spoon, self fed cup and straw sips, pureed material and dual textured solids.  He presented with an oral dysphagia.  Transient penetration was seen during the swallow given thin liquids via spoon, cup and straw.  Recommend a dysphagia 2 diet with thin liquids.  Suggest full supervision for intake due to his impulsivity to pace intake.  ST will follow during acute stay and he would benefit from continued follow up at the next level of care.  Please see below for information regarding swallow physiology.     ORAL PHASE -Fair lingual control particularly given thin liquids.  This led to poor bolus control and allowed material to fall the level of the pyriform sinuses prior to the swallow trigger. -Mildly reduced lingual movement which led to a delay in bolus transport particularly seen during dual textured bolus trials. -Oral residue across texture trials.  He was generally sensate to this residue and able to recollect and clear oral cavity when there was a significant amount of residue.    PHARYNGEAL PHASE -Functional pharyngeal swallow.  No pharyngeal residue was seen post swallow. -Timely swallow trigger. -Transient penetration of thin liquids during the swallow.  This material was noted to completely clear the laryngeal vestibule.  ESOPHAGEAL PHASE -Sweep revealed it slow to clear.     Swallow Evaluation Recommendations       SLP Diet Recommendations: Dysphagia 2 (Fine chop) solids;Thin liquid   Liquid Administration via: Cup;Straw   Medication Administration: Whole meds with liquid   Supervision: Staff to assist with self  feeding   Compensations: Slow rate;Small sips/bites   Postural Changes: Remain semi-upright after after feeds/meals (Comment);Seated upright at 90 degrees   Oral Care Recommendations: Oral care BID   Other Recommendations: Have oral suction available   Shelly Flatten, MA, [place] [facility] SLP [phone]  Lamar Sprinkles 12/12/2019,12:14 PM

## 2019-12-12 NOTE — Progress Notes (Signed)
VASCULAR LAB    Left upper extremity venous duplex completed.    Preliminary report:  See CV proc for preliminary results.  Mayah Urquidi, RVT 12/12/2019, 11:57 AM

## 2019-12-12 NOTE — Progress Notes (Signed)
NAME:  Nigil Braman, MRN:  [id number], DOB:  04-02-1981, LOS: 6 ADMISSION DATE:  12/06/2019, CONSULTATION DATE: 12/07/2018 REFERRING MD: Neurology, CHIEF COMPLAINT: Status post intracerebral hemorrhage, hypoxia respiratory distress.  Brief History   39 year old with acute onset of left-sided weakness right thalami bleed admitted to the hospital for further evaluation and treatment.  History of present illness   39 year old male following intercourse with his wife felt ill and had left-sided weakness.  Brought to the hospital found to have a right thalami bleed which time neurology admitted him to the [facility] intensive care unit.  His mental status declined his respiratory status declined pulmonary critical care was called to bedside and found him tachypneic agitated and hypoxic.  He was electively intubated after discussions with him so would be able to travel to CT scan safely.  He also had a central line placed for 3% saline.  He may need interventricular drain plus or minus surgical interventions per neurosurgery.  Pulmonary critical care asked to consult he was intubated central line placed he is hemodynamically stable and being transferred to x-ray for further treatment evaluation.  Past Medical History  Noncompliant with medications hypertension  Significant Hospital Events   Intubated 12/07/2019 Extubated 6/25 Seizure 6/25  Consults:  12/07/2019 neurosurgery 12/07/2019 CCM  Procedures:  12/07/2019 intubation 12/07/2018 with left internal jugular central line placed  Significant Diagnostic Tests:  CT of the head with right ophthalmic bleeding  Micro Data:  6/22 fob>> no orgs seen  Antimicrobials:  6/22 unasyn>>  Interim history/subjective:   Increased agitation overnight. Back on precedex and cleviprex   Objective   Blood pressure (!) 158/101, pulse (!) 111, temperature (!) 100.7 F (38.2 C), temperature source Axillary, resp. rate (!) 25, height 5\' 7"  (1.702 m), weight 74  kg, SpO2 100 %.        Intake/Output Summary (Last 24 hours) at 12/12/2019 0747 Last data filed at 12/12/2019 0600 Gross per 24 hour  Intake 3324.22 ml  Output 2500 ml  Net 824.22 ml   Filed Weights   12/06/19 0455 12/06/19 0940 12/12/19 0500  Weight: 75.5 kg 75.1 kg 74 kg    Examination: General: laying in bed  HENT: NCAT, tracking  NEURO: left hemiplegia  Lungs: anterior rhonchi  Cardiovascular: RRR, s1 s2 Abdomen: soft , nt nd  Extremities: no edema  GU: condom cath   Resolved Hospital Problem list     Assessment & Plan:   Acute Hypoxemic Respiratory Failure, LLL Aspiration pneumonia, dense consolidation  P - complete 7 days unasyn and stop  Agitation  - required precedex again last night  - continue to wean off - started clonidine taper, will help getting off cleviprex too   Acute right thalamus intracranial hemorrhage on 12/07/2019 P - CT and  MRI stable  - HS 3% per neurology   Poorly controlled hypertension P - Goal SBP < 140 - made changes to oral regimen - working with nursing to get patient to take with applesauce  - IV cleviprex is barrier to transfer from ICU   Best practice:  Diet: NPO, dietary just by and awaiting recc's but likely thick/pureed Pain/Anxiety/Delirium protocol (if indicated): precedex,  VAP protocol (if indicated): not at this time DVT prophylaxis: Mechanical only  GI prophylaxis: PPI Glucose control: cbgs with ssi  Mobility: Bedrest, will initiate PT/OT Code Status: Full Family Communication: Per primary team  Disposition: ICU   Labs   CBC: Recent Labs  Lab 12/06/19 0422 12/06/19 6283 12/07/19 0519 12/07/19 1517  12/08/19 0359 12/08/19 0518 12/09/19 0444 12/10/19 0423 12/12/19 0500  WBC 11.6*   < > 20.6*  --   --  12.8* 15.7* 11.4* 11.8*  NEUTROABS 6.7  --   --   --   --  10.4*  --  9.0*  --   HGB 14.7   < > 13.4   < > 11.9* 12.4* 13.1 13.2 13.7  HCT 45.9   < > 42.3   < > 35.0* 41.8 44.1 43.7 44.2  MCV 85.6   <  > 85.8  --   --  91.7 90.4 89.2 87.2  PLT 351   < > 323  --   --  265 279 275 288   < > = values in this interval not displayed.    Basic Metabolic Panel: Recent Labs  Lab 12/08/19 0518 12/08/19 1024 12/08/19 1711 12/09/19 0006 12/09/19 0444 12/09/19 1030 12/09/19 1635 12/09/19 2157 12/10/19 0423 12/10/19 1020 12/11/19 0326 12/11/19 0326 12/11/19 0800 12/11/19 1422 12/11/19 2007 12/12/19 0200 12/12/19 0500  NA 156*   < > 151*   < > 150*   < > 153*   < > 155*   < > 157*   < > 158* 158* 155* 155* 154*  K 4.2  --   --   --  3.8  --   --   --  3.7  --  3.0*  --   --   --   --   --  3.6  CL 126*  --   --   --  113*  --   --   --  119*  --  116*  --   --   --   --   --  120*  CO2 24  --   --   --  27  --   --   --  26  --  24  --   --   --   --   --  24  GLUCOSE 95  --   --   --  146*  --   --   --  94  --  105*  --   --   --   --   --  130*  BUN 13  --   --   --  15  --   --   --  22*  --  28*  --   --   --   --   --  35*  CREATININE 1.57*  --   --   --  1.49*  --   --   --  1.51*  --  1.74*  --   --   --   --   --  1.52*  CALCIUM 8.3*  --   --   --  9.0  --   --   --  8.7*  --  8.6*  --   --   --   --   --  8.8*  MG 2.0  --  1.8  --  2.0  --  2.2  --  2.1  --   --   --   --   --   --   --   --   PHOS 3.3  --  3.4  --  4.6  --  3.9  --  3.1  --   --   --   --   --   --   --   --    < > =  values in this interval not displayed.   GFR: Estimated Creatinine Clearance: 61.6 mL/min (A) (by C-G formula based on SCr of 1.52 mg/dL (H)). Recent Labs  Lab 12/08/19 0518 12/09/19 0444 12/10/19 0423 12/12/19 0500  WBC 12.8* 15.7* 11.4* 11.8*    Liver Function Tests: Recent Labs  Lab 12/06/19 0422  AST 25  ALT 22  ALKPHOS 57  BILITOT 0.5  PROT 6.9  ALBUMIN 4.1   No results for input(s): LIPASE, AMYLASE in the last 168 hours. No results for input(s): AMMONIA in the last 168 hours.  ABG    Component Value Date/Time   PHART 7.316 (L) 12/08/2019 0359   PCO2ART 48.1 (H)  12/08/2019 0359   PO2ART 146 (H) 12/08/2019 0359   HCO3 24.4 12/08/2019 0359   TCO2 26 12/08/2019 0359   ACIDBASEDEF 2.0 12/08/2019 0359   O2SAT 99.0 12/08/2019 0359     Coagulation Profile: Recent Labs  Lab 12/06/19 0422  INR 0.9    Cardiac Enzymes: No results for input(s): CKTOTAL, CKMB, CKMBINDEX, TROPONINI in the last 168 hours.  HbA1C: Hgb A1c MFr Bld  Date/Time Value Ref Range Status  12/06/2019 12:03 PM 5.6 4.8 - 5.6 % Final    Comment:    (NOTE)         Prediabetes: 5.7 - 6.4         Diabetes: >6.4         Glycemic control for adults with diabetes: <7.0     CBG: Recent Labs  Lab 12/11/19 1528 12/11/19 1922 12/11/19 2304 12/12/19 0309 12/12/19 0724  GLUCAP 104* 114* 107* 109* 131*    This patient is critically ill with multiple organ system failure; which, requires frequent high complexity decision making, assessment, support, evaluation, and titration of therapies. This was completed through the application of advanced monitoring technologies and extensive interpretation of multiple databases. During this encounter critical care time was devoted to patient care services described in this note for 31 minutes.  Garner Nash, DO [facility] 12/12/2019 7:49 AM

## 2019-12-12 NOTE — Progress Notes (Signed)
STROKE TEAM PROGRESS NOTE   INTERVAL HISTORY RN at bedside. Pt awake alert but not fully orientated with mild confusion and delirium. Overnight fever Tmax 101.7. this am 98.4 with tylenol. On unasyn. Moving right side well but not moving on the left. CT yesterday evening stable. Speech on board, will do barium study today. On cleviprex. Na 154 and Cre trending down. WBC stable. On NS. CCM put on clonidine to wean off precedex.    Vitals:   12/12/19 0600 12/12/19 0615 12/12/19 0630 12/12/19 0636  BP: (!) 146/92 (!) 132/91 (!) 162/106 (!) 158/101  Pulse: 95 87 95 (!) 111  Resp: (!) 23 (!) 35 (!) 24 (!) 25  Temp:      TempSrc:      SpO2: 98% 96% 100% 100%  Weight:      Height:       CBC:  Recent Labs  Lab 12/08/19 0518 12/09/19 0444 12/10/19 0423 12/12/19 0500  WBC 12.8*   < > 11.4* 11.8*  NEUTROABS 10.4*  --  9.0*  --   HGB 12.4*   < > 13.2 13.7  HCT 41.8   < > 43.7 44.2  MCV 91.7   < > 89.2 87.2  PLT 265   < > 275 288   < > = values in this interval not displayed.   Basic Metabolic Panel:  Recent Labs  Lab 12/09/19 1635 12/09/19 2157 12/10/19 0423 12/10/19 1020 12/11/19 0326 12/11/19 0800 12/12/19 0200 12/12/19 0500  NA 153*   < > 155*   < > 157*   < > 155* 154*  K  --   --  3.7  --  3.0*  --   --  3.6  CL  --   --  119*  --  116*  --   --  120*  CO2  --   --  26  --  24  --   --  24  GLUCOSE  --   --  94  --  105*  --   --  130*  BUN  --   --  22*  --  28*  --   --  35*  CREATININE  --   --  1.51*  --  1.74*  --   --  1.52*  CALCIUM  --   --  8.7*  --  8.6*  --   --  8.8*  MG 2.2  --  2.1  --   --   --   --   --   PHOS 3.9  --  3.1  --   --   --   --   --    < > = values in this interval not displayed.   Lipid Panel:     Component Value Date/Time   CHOL 232 (H) 12/06/2019 1203   TRIG 486 (H) 12/10/2019 0814   HDL 42 12/06/2019 1203   CHOLHDL 5.5 12/06/2019 1203   VLDL UNABLE TO CALCULATE IF TRIGLYCERIDE OVER 400 mg/dL 12/06/2019 1203   LDLCALC UNABLE TO  CALCULATE IF TRIGLYCERIDE OVER 400 mg/dL 12/06/2019 1203   HgbA1c:  Lab Results  Component Value Date   HGBA1C 5.6 12/06/2019   Urine Drug Screen:     Component Value Date/Time   LABOPIA NONE DETECTED 12/11/2019 1103   COCAINSCRNUR NONE DETECTED 12/11/2019 1103   LABBENZ POSITIVE (A) 12/11/2019 1103   AMPHETMU NONE DETECTED 12/11/2019 1103   THCU NONE DETECTED 12/11/2019 1103   LABBARB NONE DETECTED  12/11/2019 1103    Alcohol Level     Component Value Date/Time   ETH <10 12/06/2019 1203     IMAGING past 24 hours  CT HEAD WO CONTRAST  Result Date: 12/11/2019 CLINICAL DATA:  Intracranial hemorrhage, follow-up EXAM: CT HEAD WITHOUT CONTRAST TECHNIQUE: Contiguous axial images were obtained from the base of the skull through the vertex without intravenous contrast. COMPARISON:  12/07/2019 FINDINGS: Brain: Hemorrhage centered within the right thalamus is not substantially changed in size with similar surrounding edema. Intraventricular extension is again noted with some interval redistribution. Similar regional mass effect including effacement of the third ventricle. Lateral ventricles are stable in size. No new hemorrhage.  No loss of gray-white differentiation. Vascular: No new finding. Skull: Calvarium is unremarkable. Sinuses/Orbits: No acute finding. Other: None. IMPRESSION: No substantial change in right thalamic hemorrhage with intraventricular extension since 12/07/2019. No new hemorrhage or worsening mass effect. Stable caliber of ventricles. Electronically Signed   By: Macy Mis M.D.   On: 12/11/2019 17:57   DG CHEST PORT 1 VIEW  Result Date: 12/11/2019 CLINICAL DATA:  Fever for 2 days. EXAM: PORTABLE CHEST 1 VIEW COMPARISON:  12/08/2019 FINDINGS: Endotracheal and nasogastric tubes have been removed. Left internal jugular central venous line is stable. Hazy lung opacity noted on the left on the prior study has mostly resolved. No new lung abnormalities. No pleural effusion or  pneumothorax. IMPRESSION: 1. Significant improvement compared to the prior study with near resolution of the previously seen hazy left lung airspace opacity. No new abnormalities. 2. Status post extubation and removal of the nasogastric tube. Electronically Signed   By: Lajean Manes M.D.   On: 12/11/2019 11:52     MRI WO Contrast  12/08/19 IMPRESSION: 1. Unchanged size of right thalamic intraparenchymal hematoma with mild surrounding edema. No hydrocephalus or ventricular entrapment. 2. 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  Bilateral Carotid Dopplers  12/08/19 Summary:  Right Carotid: Velocities in the right ICA are consistent with a 1-39% stenosis.  Left Carotid: Velocities in the visualized segment of the left ICA are consistent with a 1-39% stenosis.  Vertebrals: Right vertebral artery demonstrates antegrade flow. Left vertebral artery was not visualized.  Subclavians: Left subclavian artery was not visualized. Normal flow hemodynamics were seen in the right subclavian artery.   Portable CXR 12/08/19 IMPRESSION: Stable support apparatus. Stable large left lung opacity concerning for pneumonia. Improved right basilar opacity is noted. No significant pleural effusion is noted.  PHYSICAL EXAM  Temp:  [99.1 F (37.3 C)-101.9 F (38.8 C)] 100.7 F (38.2 C) (06/27 0400) Pulse Rate:  [87-138] 111 (06/27 0636) Resp:  [14-35] 25 (06/27 0636) BP: (97-177)/(70-145) 158/101 (06/27 0636) SpO2:  [94 %-100 %] 100 % (06/27 0636) Weight:  [74 kg] 74 kg (06/27 0500)  General - Well nourished, well developed, mild delirium.  Ophthalmologic - fundi not visualized due to noncooperation.  Cardiovascular - Regular rhythm and tachycardia.  Neuro - awake alert, orientated to self, place and time but not to age. Talkative and out of topic with some word salad, mild delirium, able to repeat and name. Following all simple commands. Right gaze preference  but able to cross midline, left hemianopia and visual neglect. Left facial droop. Tongue midline. Left hemiplegia. Right UE and LE at least 4/5. DTR 1+ and no babinski. Sensation subjectively symmetrical. Right FTN intact. Gait not tested.   ASSESSMENT/PLAN Mr. Trip Cavanagh is a 39 y.o. male with recent HTN at dentist office with plans for PCP  visit next week, presenting post coitus with L sided weakness, elevated BP.   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 IVH. Local mass effect w/o midline shift  CTA head Unremarkable   CT head at 12h stable  CT head 6/22 0440 stable R thalamic hemorrhage, IVH. Sllight increased in ventricle size. 4-5 midline shift  CT head 6/22 remains stable     MRI 6/23 - Unchanged size of right thalamic ICH with mild surrounding edema. No hydrocephalus or ventricular entrapment.   CT Head 6/26 - No substantial change  EEG - severe diffuse encephalopathy  Carotid Doppler - unremarkable  2D Echo EF 60-65%. No source of embolus    LDL 157  HgbA1c 5.6   Heparin subq for VTE prophylaxis  No antithrombotic prior to admission, now on No antithrombotic given hemorrhage   Therapy recommendations: CIR   Disposition:  pending   Cerebral Edema   On 3% protocol -> now off   Now on NS  NA 151-153-151-150->155->158->154  Goal 150-155  Repeat CT 6/26 stable   MRI unchanged size with mild edema  Na monitoring   Hypoxic Respiratory Failure Aspiration PNA Fever  leukocytosis  Emergently intubated 6/22 -> extubated 6/25  CT chest L consolidation   Bronchoscopy, BAL to LLL  Tmax - 101.8->100.2 ->101.9->101.7  WBC - 20.6->12.8->15.7->11.4->11.8  Unasyn 6/22>>    CXR - 6/23 - Stable large left lung opacity concerning for pneumonia. Improved right basilar opacity is noted  CXR 6/26 Significant improvement compared to the prior study with near resolution of the previously seen hazy left lung  airspace opacity  CCM onboard   Hypertensive Emergency   BP as high as 219/162   Home meds:  None  SBP goal < 160  Now on cleviprex . Added clonidine to wean off precedex . Now on hold - Coreg 25 mg Bid ; Norvasc 10 mg QD ; Hydralazine 50 mg Q8 hrs ; HCTZ 25 mg QD ; spironolactone 25 mg QD (Labetalol prn ; Hydralazine prn) . Long-term BP goal normotensive   Seizure   Agitation following extubation 6/25  L trapezius muscle contractions, extending to R trapezius, with subsequent Upper and lower extremity rigidity with rhythmic contraction. Pt unresponsive, upward deviated gaze. C/w seizure   S/p ativan  On depakote 500 Q6h -> 750 Q8h  EEG severe diffuse encephalopathy  Depakote level - 39 -> pending   Hyperlipidemia  Home meds: none   LDL 157  Hold statin in setting of acute hemorrhage   Consider statin at d/c or f/u appt  Dysphagia . Secondary to stroke . NPO . Speech on board . Pt removed cortrak -> TF stopped  . On IVF . BMS today  AKI vs. CKD  Creatinine 1.4-1.57-1.74->1.52  On IVF  BMP monitoring  Other Stroke Risk Factors  ETOH use, advised to drink no more than 2 drink(s) a day  Substance abuse - UDS:  THC Positive. Patient will be advised to stop using due to stroke risk.  Other Active Problems  Hypokalemia 3.2-2.9-4.1->3.7 ->3.0->3.6  Hospital day # 6  Patient continues to be critically ill for the last 24 hours, has developed delirium on restraint, continued fever and leukocytosis, continues to have left hemiplegia and AKI, and I have ordered MBS and speech consult, continue IVF and Na monitoring, changed depakote dose and monitor depakote level. I discussed with Dr. IValeta Harms    This patient is critically ill due to IMilo  IVH, cerebral edema, seizure, dysphagia, fever, leukocytosis, AKI, respiratory failure and at significant risk of neurological worsening, death form hematoma expansion, hydrocephalus, cerebral edema, brain herniation,  status epilepticus, sepsis. This patient's care requires constant monitoring of vital signs, hemodynamics, respiratory and cardiac monitoring, review of multiple databases, neurological assessment, discussion with family, other specialists and medical decision making of high complexity. I spent 35 minutes of neurocritical care time in the care of this patient.   Rosalin Hawking, MD PhD Stroke Neurology 12/12/2019 10:41 AM    To contact Stroke Continuity provider, please refer to [email or web address] After hours, contact General Neurology

## 2019-12-12 NOTE — Evaluation (Signed)
Speech Language Pathology Evaluation Patient Details Name: Dennis Mercado MRN: [id number] DOB: Nov 15, 1980 Today's Date: 12/12/2019 Time: 0940-1010 SLP Time Calculation (min) (ACUTE ONLY): 30 min  Problem List:  Patient Active Problem List   Diagnosis Date Noted   Acute respiratory failure ([place])    ICH (intracerebral hemorrhage) ([place]) 12/06/2019   Past Medical History: No past medical history on file. Past Surgical History: No past surgical history on file. HPI:  Dennis Mercado is an 39 y.o. male with a PMHx of HTN who presents via EMS as a Code Stroke after acute onset of left sided weakness and headache.  His wife noted that his left side was weak and that he was starting to look altered. Left facial weakness was also noted. His wife then called EMS. On arrival EMS noted the same deficits. Vitals per EMS: BP 207/148, HR 92, 97% on RA, CBG 122, afebrile. LKN was 0315.  Initial CT of the head was showing Acute hemorrhage centered in the right thalamus measures 3.6 x 2.8 x 2.6 cm. Estimated volume is 13.1 mL.  Hemorrhage extends into the ventricles and can be seen in the   Assessment / Plan / Recommendation Clinical Impression  Cognitive/linguistic evaluation and motor speech screen was completed.  Cranial nerve exam was completed and remarkable for left sided facial weakness and decreased sensation.  The weakness encompassed his lower face and he had difficulty closing his left eye.  It did not appear to impact his left forehead.  Lingual deviation to the left was noted with movement.  Lingual, labial and jaw strength was unable to be assessed.  Speech was unclear impacting intelligiblity.  Vocal volume was also low.  There appears to be a component of dysarthria, however, he was unable to be fully assessed due to lethargy at the end of the session.  He achieved an overall score of 13 out of a possible 25 points on the [place] Exam. It is normally scored out of 30, however, the  writing, copying and three step command portions were not administered.  He was fully oriented to self only.  He was disoriented to time, place and situation.  When first asked where he was he replied "a cow's belly".  Given reivew of the year, date and day of the week he was able to in the short term retain this information.  He was also able to state he would use a calendar to orient himself to the date.  Immediate recall of three novel words was good, however, given a short delay he was unable to recall any of the words.  Given either semantic or multiple choice cue he was able to recall all three novel words.  His attention to task was poor and appeared to be at least partly impacted by lethargy.  He required fairly regular stimluation to maintain alertness.  He was easily visuallly distracted by things in the room and actually stated a few times that signs on the wall were visually distracting as was the nurse who was quietly walking around the room.  He was able to provide logical solutions to very simple problems..  Given current presentation he will require intense post acute rehab to address deficits and maximize functional status.  ST will follow during acute.     SLP Assessment  SLP Recommendation/Assessment: Patient needs continued Speech Lanaguage Pathology Services SLP Visit Diagnosis: Cognitive communication deficit (R41.841)    Follow Up Recommendations  Inpatient Rehab    Frequency and Duration min  2x/week  2 weeks      SLP Evaluation Cognition  Overall Cognitive Status: Impaired/Different from baseline Arousal/Alertness: Awake/alert Orientation Level: Oriented to person;Disoriented to place;Disoriented to time;Disoriented to situation Attention: Sustained Sustained Attention: Impaired Sustained Attention Impairment: Verbal basic Memory: Impaired Memory Impairment: Decreased recall of new information Awareness: Impaired Awareness Impairment: Intellectual impairment Problem  Solving: Appears intact Problem Solving Impairment: Verbal basic       Comprehension  Auditory Comprehension Overall Auditory Comprehension: Appears within functional limits for tasks assessed Yes/No Questions: Not tested Commands: Within Functional Limits Conversation: Simple Reading Comprehension Reading Status: Impaired Word level: Impaired Sentence Level: Impaired Paragraph Level: Not tested Functional Environmental (signs, name badge): Not tested    Expression Expression Primary Mode of Expression: Verbal Verbal Expression Overall Verbal Expression: Impaired Initiation: No impairment Automatic Speech: Name;Social Response Level of Generative/Spontaneous Verbalization: Phrase;Sentence Repetition: No impairment Naming: No impairment Pragmatics: No impairment Written Expression Written Expression: Not tested   Oral / Motor  Oral Motor/Sensory Function Overall Oral Motor/Sensory Function: Moderate impairment Facial ROM: Reduced left;Suspected CN VII (facial) dysfunction Facial Symmetry: Abnormal symmetry left;Suspected CN VII (facial) dysfunction Facial Strength: Reduced left;Suspected CN VII (facial) dysfunction Facial Sensation: Reduced left;Suspected CN V (Trigeminal) dysfunction Lingual ROM: Reduced left Lingual Symmetry: Within Functional Limits Motor Speech Overall Motor Speech: Impaired Respiration: Impaired Level of Impairment: Word Phonation: Breathy;Low vocal intensity Resonance:  (not tested) Articulation: Impaired Level of Impairment: Phrase Intelligibility: Intelligibility reduced Word: 50-74% accurate Phrase: 50-74% accurate Sentence: 50-74% accurate Conversation: 50-74% accurate Motor Planning: Not tested   GO                    Lamar Sprinkles 12/12/2019, 10:35 AM

## 2019-12-12 NOTE — Plan of Care (Signed)
Pt passed swallow, resume home BP meds, but hold off HCTZ due to elevated Cre. Taper off cleviprex as able, BP goal < 160. Continue gentle hydration at this time. Follow up with BMP in am.  Continue depakote for one more day, switch to keppra. Will start keppra for today and overlap with depakote for transition.  Pt also on clonidine for precedex taper, will close monitor BP.   Rosalin Hawking, MD PhD Stroke Neurology 12/12/2019 3:15 PM

## 2019-12-13 LAB — BASIC METABOLIC PANEL
Anion gap: 12 (ref 5–15)
BUN: 26 mg/dL — ABNORMAL HIGH (ref 6–20)
CO2: 24 mmol/L (ref 22–32)
Calcium: 8.8 mg/dL — ABNORMAL LOW (ref 8.9–10.3)
Chloride: 112 mmol/L — ABNORMAL HIGH (ref 98–111)
Creatinine, Ser: 1.45 mg/dL — ABNORMAL HIGH (ref 0.61–1.24)
GFR calc Af Amer: >60 mL/min (ref >60)
GFR calc non Af Amer: >60 mL/min (ref >60)
Glucose, Bld: 105 mg/dL — ABNORMAL HIGH (ref 70–99)
Potassium: 2.9 mmol/L — ABNORMAL LOW (ref 3.5–5.1)
Sodium: 148 mmol/L — ABNORMAL HIGH (ref 135–145)

## 2019-12-13 LAB — CBC
HCT: 41.9 % (ref 39.0–52.0)
Hemoglobin: 13.3 g/dL (ref 13.0–17.0)
MCH: 27.1 pg (ref 26.0–34.0)
MCHC: 31.7 g/dL (ref 30.0–36.0)
MCV: 85.5 fL (ref 80.0–100.0)
Platelets: 294 10*3/uL (ref 150–400)
RBC: 4.9 MIL/uL (ref 4.22–5.81)
RDW: 14.7 % (ref 11.5–15.5)
WBC: 9.7 10*3/uL (ref 4.0–10.5)
nRBC: 0.5 % — ABNORMAL HIGH (ref 0.0–0.2)

## 2019-12-13 LAB — GLUCOSE, CAPILLARY
Glucose-Capillary: 143 mg/dL — ABNORMAL HIGH (ref 70–99)
Glucose-Capillary: 119 mg/dL — ABNORMAL HIGH (ref 70–99)
Glucose-Capillary: 140 mg/dL — ABNORMAL HIGH (ref 70–99)
Glucose-Capillary: 117 mg/dL — ABNORMAL HIGH (ref 70–99)

## 2019-12-13 LAB — TRIGLYCERIDES: Triglycerides: 294 mg/dL — ABNORMAL HIGH (ref <150)

## 2019-12-13 MED ORDER — AMLODIPINE BESYLATE 10 MG PO TABS
10.0000 mg | ORAL_TABLET | Freq: Every day | ORAL | Status: DC
  Administered 2019-12-14 – 2019-12-15 (×2): 10 mg via ORAL
  Filled 2019-12-13 (×2): qty 1

## 2019-12-13 MED ORDER — SENNA 8.6 MG PO TABS
1.0000 | ORAL_TABLET | Freq: Two times a day (BID) | ORAL | Status: DC
  Administered 2019-12-13 – 2019-12-15 (×4): 8.6 mg via ORAL
  Filled 2019-12-13 (×4): qty 1

## 2019-12-13 MED ORDER — CLONIDINE HCL 0.1 MG PO TABS
0.2000 mg | ORAL_TABLET | Freq: Two times a day (BID) | ORAL | Status: DC
  Administered 2019-12-13 – 2019-12-14 (×2): 0.2 mg via ORAL
  Filled 2019-12-13 (×2): qty 2

## 2019-12-13 MED ORDER — HYDRALAZINE HCL 20 MG/ML IJ SOLN
5.0000 mg | INTRAMUSCULAR | Status: DC | PRN
  Administered 2019-12-13 – 2019-12-14 (×2): 20 mg via INTRAVENOUS
  Filled 2019-12-13 (×2): qty 1

## 2019-12-13 MED ORDER — ORAL CARE MOUTH RINSE
15.0000 mL | Freq: Two times a day (BID) | OROMUCOSAL | Status: DC
  Administered 2019-12-13 – 2019-12-15 (×5): 15 mL via OROMUCOSAL

## 2019-12-13 MED ORDER — POTASSIUM CHLORIDE 20 MEQ PO PACK
40.0000 meq | PACK | ORAL | Status: AC
  Administered 2019-12-13 (×3): 40 meq via ORAL
  Filled 2019-12-13 (×3): qty 2

## 2019-12-13 MED ORDER — LABETALOL HCL 5 MG/ML IV SOLN
5.0000 mg | INTRAVENOUS | Status: DC | PRN
  Administered 2019-12-13: 10 mg via INTRAVENOUS
  Administered 2019-12-13 – 2019-12-14 (×2): 20 mg via INTRAVENOUS
  Administered 2019-12-14: 10 mg via INTRAVENOUS
  Administered 2019-12-14: 20 mg via INTRAVENOUS
  Filled 2019-12-13 (×6): qty 4

## 2019-12-13 MED ORDER — ENSURE ENLIVE PO LIQD
237.0000 mL | Freq: Three times a day (TID) | ORAL | Status: DC
  Administered 2019-12-13 – 2019-12-15 (×5): 237 mL via ORAL

## 2019-12-13 MED ORDER — MELATONIN 3 MG PO TABS
3.0000 mg | ORAL_TABLET | Freq: Every day | ORAL | Status: DC
  Administered 2019-12-13 – 2019-12-14 (×2): 3 mg via ORAL
  Filled 2019-12-13 (×2): qty 1

## 2019-12-13 MED ORDER — LISINOPRIL 10 MG PO TABS
10.0000 mg | ORAL_TABLET | Freq: Every day | ORAL | Status: DC
  Administered 2019-12-13 – 2019-12-14 (×2): 10 mg via ORAL
  Filled 2019-12-13 (×2): qty 1

## 2019-12-13 NOTE — Consult Note (Signed)
Physical Medicine and Rehabilitation Consult Reason for Consult: Left side weakness Referring Physician: Dr.Xu   HPI: Dennis Mercado is a 39 y.o. right-handed male with documented history of hypertension on no current antihypertensive medications.  Per chart review patient lives with spouse.  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  1 level home one-step to entry.  Presented 12/06/2019 with acute onset of left side weakness and altered mental status.  Cranial CT scan showed acute hemorrhage centered in the right thalamus measuring 3.6 x 2.8 x 2.6 cm.  Hemorrhage extends into the ventricles and can be seen in the third and fourth ventricles without hydrocephalus.  Local mass-effect without midline shift.  CT angiogram of head and neck normal variant CTA circle of Willis without significant proximal stenosis aneurysm or branch vessel occlusion.  Admission chemistries potassium 3.2, creatinine 1.52, urine drug screen positive marijuana.  Echo with ejection fraction of 65% no wall motion abnormalities.  EEG negative for seizure.  Carotid Dopplers unremarkable.  Venous Dopplers upper extremity negative for DVT.  MRI follow-up unchanged size of right thalamic intraparenchymal hematoma with mild surrounding edema.  Patient maintained on 3% protocol.  Hospital course fever complicated by large left lung opacity concerning for pneumonia currently maintained on Unasyn.  Dysphagia #2 thin liquid diet.  Cleviprex ongoing for blood pressure control.  He was cleared to begin subcutaneous heparin for DVT prophylaxis 12/11/2019.  Therapy evaluation completed with recommendations of physical medicine rehab consult.   Review of Systems  Constitutional: Positive for fever. Negative for chills.  HENT: Negative for hearing loss.   Eyes: Negative for blurred vision and double vision.  Respiratory: Negative for cough and shortness of breath.   Cardiovascular: Negative for chest pain,  palpitations and leg swelling.  Gastrointestinal: Positive for constipation. Negative for heartburn, nausea and vomiting.  Genitourinary: Negative for dysuria and flank pain.  Musculoskeletal: Positive for myalgias.  Skin: Negative for rash.  Neurological: Positive for weakness and headaches.  All other systems reviewed and are negative.  No past medical history on file. No past surgical history on file. No family history on file. Social History:  reports that he has never smoked. He has never used smokeless tobacco. He reports current alcohol use. He reports that he does not use drugs. Allergies: No Known Allergies No medications prior to admission.    Home: Home Living Family/patient expects to be discharged to:: Private residence Living Arrangements: Spouse/significant other Available Help at Discharge: Family, Available 24 hours/day, Friend(s) Type of Home: House Home Access: Stairs to enter [employer] of Steps: 1 Entrance Stairs-Rails: None Home Layout: One level Bathroom Shower/Tub: Chiropodist: [place]: None Additional Comments: Mother and sister are both CNAs - sister works at [employer] on ?67M 7a-7p  Lives With: Spouse  Functional History: Prior Function Level of Independence: Independent Comments: Pt was fully independent.  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[employer]:  Mobility: Bed Mobility Overal bed mobility: Needs Assistance Bed Mobility: Rolling Rolling: Total assist General bed mobility comments: Pt unable to assist  Transfers General transfer comment: unable to attempt       ADL: ADL Overall ADL's : Needs assistance/impaired Eating/Feeding: NPO Grooming: Total assistance, Bed level Upper Body Bathing: Total assistance, Bed level Lower Body Bathing: Total assistance, Bed level Upper Body Dressing : Total assistance, Bed level Lower Body Dressing: Total assistance, Bed level Toilet Transfer:  Total assistance Toilet Transfer Details (indicate cue  type and reason): unable  Toileting- Clothing Manipulation and Hygiene: Total assistance, Bed level Functional mobility during ADLs: Total assistance General ADL Comments: Pt unable to engage in any ADL activities   Cognition: Cognition Overall Cognitive Status: Impaired/Different from baseline Arousal/Alertness: Awake/alert Orientation Level: Oriented X4 Attention: Sustained Sustained Attention: Impaired Sustained Attention Impairment: Verbal basic Memory: Impaired Memory Impairment: Decreased recall of new information Awareness: Impaired Awareness Impairment: Intellectual impairment Problem Solving: Appears intact Problem Solving Impairment: Verbal basic Cognition Arousal/Alertness: Lethargic Behavior During Therapy: Flat affect Overall Cognitive Status: Impaired/Different from baseline General Comments: Pt with eyes closed throughout session.  He demonstrated only minimal withdrawal of Rt UE to painful stimuli.  Does not follow command or respond to auditory or visual stimulation  Difficult to assess due to:  (ETT, lethargy)  Blood pressure (!) 154/107, pulse 96, temperature 98.7 F (37.1 C), temperature source Oral, resp. rate 17, height 5\' 7"  (1.702 m), weight 74 kg, SpO2 93 %.  General: Alert and oriented x 3, No apparent distress HEENT: Head is normocephalic, atraumatic, sclera anicteric, oral mucosa pink and moist, dentition intact, ext ear canals clear,  Neck: Supple without JVD or lymphadenopathy Heart: Reg rate and rhythm. No murmurs rubs or gallops Chest: CTA bilaterally without wheezes, rales, or rhonchi; no distress Abdomen: Soft, non-tender, non-distended, bowel sounds positive. Extremities: No clubbing, cyanosis, or edema. Pulses are 2+ Skin: Clean and intact without signs of breakdown Neuro: Patient is alert and somewhat anxious.  Makes eye contact with examiner. Right gaze preference. Speech is a bit  dysarthric.  He is oriented to self with some word salad.  Follows some simple commands but inconsistent. 5/5 strength on right, 0/5 on left. Sensation intact. Increased tone on left side.  Psych: Pt's affect is depressed, fatigued. Pt is cooperative   Results for orders placed or performed during the hospital encounter of 12/06/19 (from the past 24 hour(s))  Glucose, capillary     Status: Abnormal   Collection Time: 12/12/19  7:24 AM  Result Value Ref Range   Glucose-Capillary 131 (H) 70 - 99 mg/dL  Sodium     Status: Abnormal   Collection Time: 12/12/19  9:20 AM  Result Value Ref Range   Sodium 155 (H) 135 - 145 mmol/L  Glucose, capillary     Status: Abnormal   Collection Time: 12/12/19 12:42 PM  Result Value Ref Range   Glucose-Capillary 132 (H) 70 - 99 mg/dL  Glucose, capillary     Status: Abnormal   Collection Time: 12/12/19  3:26 PM  Result Value Ref Range   Glucose-Capillary 106 (H) 70 - 99 mg/dL  Glucose, capillary     Status: None   Collection Time: 12/12/19  7:36 PM  Result Value Ref Range   Glucose-Capillary 96 70 - 99 mg/dL  Glucose, capillary     Status: Abnormal   Collection Time: 12/12/19 11:16 PM  Result Value Ref Range   Glucose-Capillary 119 (H) 70 - 99 mg/dL  Glucose, capillary     Status: Abnormal   Collection Time: 12/13/19  3:28 AM  Result Value Ref Range   Glucose-Capillary 143 (H) 70 - 99 mg/dL   CT HEAD WO CONTRAST  Result Date: 12/11/2019 CLINICAL DATA:  Intracranial hemorrhage, follow-up EXAM: CT HEAD WITHOUT CONTRAST TECHNIQUE: Contiguous axial images were obtained from the base of the skull through the vertex without intravenous contrast. COMPARISON:  12/07/2019 FINDINGS: Brain: Hemorrhage centered within the right thalamus is not substantially changed in size with similar surrounding edema. Intraventricular  extension is again noted with some interval redistribution. Similar regional mass effect including effacement of the third ventricle. Lateral  ventricles are stable in size. No new hemorrhage.  No loss of gray-white differentiation. Vascular: No new finding. Skull: Calvarium is unremarkable. Sinuses/Orbits: No acute finding. Other: None. IMPRESSION: No substantial change in right thalamic hemorrhage with intraventricular extension since 12/07/2019. No new hemorrhage or worsening mass effect. Stable caliber of ventricles. Electronically Signed   By: Macy Mis M.D.   On: 12/11/2019 17:57   DG CHEST PORT 1 VIEW  Result Date: 12/11/2019 CLINICAL DATA:  Fever for 2 days. EXAM: PORTABLE CHEST 1 VIEW COMPARISON:  12/08/2019 FINDINGS: Endotracheal and nasogastric tubes have been removed. Left internal jugular central venous line is stable. Hazy lung opacity noted on the left on the prior study has mostly resolved. No new lung abnormalities. No pleural effusion or pneumothorax. IMPRESSION: 1. Significant improvement compared to the prior study with near resolution of the previously seen hazy left lung airspace opacity. No new abnormalities. 2. Status post extubation and removal of the nasogastric tube. Electronically Signed   By: Lajean Manes M.D.   On: 12/11/2019 11:52   DG Swallowing Func-Speech Pathology  Result Date: 12/12/2019 Objective Swallowing Evaluation: Type of Study: MBS-Modified Barium Swallow Study  Patient Details Name: Dennis Mercado MRN: [id number] Date of Birth: 10/17/1980 Today's Date: 12/12/2019 Time: SLP Start Time (ACUTE ONLY): 1130 -SLP Stop Time (ACUTE ONLY): 1200 SLP Time Calculation (min) (ACUTE ONLY): 30 min Past Medical History: No past medical history on file. Past Surgical History: No past surgical history on file. HPI: Dennis Mercado is an 39 y.o. male with a PMHx of HTN who presents via EMS as a Code Stroke after acute onset of left sided weakness and headache.  His wife noted that his left side was weak and that he was starting to look altered. Left facial weakness was also noted. His wife then called EMS. On arrival EMS  noted the same deficits. Vitals per EMS: BP 207/148, HR 92, 97% on RA, CBG 122, afebrile. LKN was 0315.  Initial CT of the head was showing Acute hemorrhage centered in the right thalamus measures 3.6 x 2.8 x 2.6 cm. Estimated volume is 13.1 mL.  Hemorrhage extends into the ventricles and can be seen in the  Subjective: The patient was seen in radiology sitting upright in a chair.  Assessment / Plan / Recommendation CHL IP CLINICAL IMPRESSIONS 12/12/2019 Clinical Impression MBS was completed using thin liquids via spoon, self fed cup and straw sips, pureed material and dual textured solids.  He presented with a mild oral dysphagia.  Transient penetration was seen during the swallow given thin liquids via spoon, cup and straw.  Recommend a dysphagia 2 diet with thin liquids.  Suggest full supervision for intake due to his impulsivity to pace intake.  ST will follow during acute stay and he would benefit from continued follow up at the next level of care.  Please see below for information regarding swallow physiology.  ORAL PHASE -Fair lingual control particularly given thin liquids.  This led to poor bolus control and allowed material to fall the level of the pyriform sinuses prior to the swallow trigger. -Mildly reduced lingual movement which led to a delay in bolus transport particularly seen during dual textured bolus trials. -Oral residue across texture trials.  He was generally sensate to this residue and able to recollect and clear oral cavity when there was a significant amount of residue.  PHARYNGEAL  PHASE -Functional pharyngeal swallow.  No pharyngeal residue was seen post swallow. -Timely swallow trigger. -Transient penetration of thin liquids during the swallow.  This material was noted to completely clear the laryngeal vestibule. ESOPHAGEAL PHASE -Sweep revealed it slow to clear.   SLP Visit Diagnosis Dysphagia, oral phase (R13.11) Attention and concentration deficit following -- Frontal lobe and executive  function deficit following -- Impact on safety and function Mild aspiration risk   CHL IP TREATMENT RECOMMENDATION 12/12/2019 Treatment Recommendations Therapy as outlined in treatment plan below   Prognosis 12/12/2019 Prognosis for Safe Diet Advancement Good Barriers to Reach Goals Cognitive deficits Barriers/Prognosis Comment -- CHL IP DIET RECOMMENDATION 12/12/2019 SLP Diet Recommendations Dysphagia 2 (Fine chop) solids;Thin liquid Liquid Administration via Cup;Straw Medication Administration Whole meds with liquid Compensations Slow rate;Small sips/bites Postural Changes Remain semi-upright after after feeds/meals (Comment);Seated upright at 90 degrees   CHL IP OTHER RECOMMENDATIONS 12/12/2019 Recommended Consults -- Oral Care Recommendations Oral care BID Other Recommendations Have oral suction available   CHL IP FOLLOW UP RECOMMENDATIONS 12/12/2019 Follow up Recommendations Inpatient Rehab   CHL IP FREQUENCY AND DURATION 12/12/2019 Speech Therapy Frequency (ACUTE ONLY) min 2x/week Treatment Duration 2 weeks      CHL IP ORAL PHASE 12/12/2019 Oral Phase Impaired Oral - Pudding Teaspoon -- Oral - Pudding Cup -- Oral - Honey Teaspoon -- Oral - Honey Cup -- Oral - Nectar Teaspoon -- Oral - Nectar Cup -- Oral - Nectar Straw -- Oral - Thin Teaspoon Decreased bolus cohesion;Premature spillage;Lingual/palatal residue Oral - Thin Cup Decreased bolus cohesion;Premature spillage;Lingual/palatal residue Oral - Thin Straw Premature spillage;Decreased bolus cohesion;Lingual/palatal residue Oral - Puree Decreased bolus cohesion;Lingual/palatal residue;Delayed oral transit Oral - Mech Soft -- Oral - Regular -- Oral - Multi-Consistency Lingual/palatal residue;Decreased bolus cohesion;Delayed oral transit Oral - Pill -- Oral Phase - Comment --  CHL IP PHARYNGEAL PHASE 12/12/2019 Pharyngeal Phase WFL Pharyngeal- Pudding Teaspoon -- Pharyngeal -- Pharyngeal- Pudding Cup -- Pharyngeal -- Pharyngeal- Honey Teaspoon -- Pharyngeal --  Pharyngeal- Honey Cup -- Pharyngeal -- Pharyngeal- Nectar Teaspoon -- Pharyngeal -- Pharyngeal- Nectar Cup -- Pharyngeal -- Pharyngeal- Nectar Straw -- Pharyngeal -- Pharyngeal- Thin Teaspoon -- Pharyngeal -- Pharyngeal- Thin Cup -- Pharyngeal -- Pharyngeal- Thin Straw -- Pharyngeal -- Pharyngeal- Puree -- Pharyngeal -- Pharyngeal- Mechanical Soft -- Pharyngeal -- Pharyngeal- Regular -- Pharyngeal -- Pharyngeal- Multi-consistency -- Pharyngeal -- Pharyngeal- Pill -- Pharyngeal -- Pharyngeal Comment --  CHL IP CERVICAL ESOPHAGEAL PHASE 12/12/2019 Cervical Esophageal Phase WFL Pudding Teaspoon -- Pudding Cup -- Honey Teaspoon -- Honey Cup -- Nectar Teaspoon -- Nectar Cup -- Nectar Straw -- Thin Teaspoon -- Thin Cup -- Thin Straw -- Puree -- Mechanical Soft -- Regular -- Multi-consistency -- Pill -- Cervical Esophageal Comment -- Shelly Flatten, MA, CCC-SLP Acute Rehab SLP [phone] Dennis Mercado 12/12/2019, 12:19 PM              VAS [place] UPPER EXTREMITY VENOUS DUPLEX  Result Date: 12/12/2019 UPPER VENOUS STUDY  Indications: Edema, and ICH Limitations: Line. Comparison Study: No prior study on file for comparison Performing Technologist: Sharion Dove RVS  Examination Guidelines: A complete evaluation includes B-mode imaging, spectral Doppler, color Doppler, and power Doppler as needed of all accessible portions of each vessel. Bilateral testing is considered an integral part of a complete examination. Limited examinations for reoccurring indications may be performed as noted.  Right Findings: +----------+------------+---------+-----------+----------+-------+ RIGHT     CompressiblePhasicitySpontaneousPropertiesSummary +----------+------------+---------+-----------+----------+-------+ Subclavian  Yes       Yes                      +----------+------------+---------+-----------+----------+-------+  Left Findings: +----------+------------+---------+-----------+----------+---------------+  LEFT      CompressiblePhasicitySpontaneousProperties    Summary     +----------+------------+---------+-----------+----------+---------------+ IJV                      Yes       Yes                              +----------+------------+---------+-----------+----------+---------------+ Subclavian               Yes       Yes                              +----------+------------+---------+-----------+----------+---------------+ Axillary                 Yes       Yes                              +----------+------------+---------+-----------+----------+---------------+ Brachial      Full       Yes       Yes                              +----------+------------+---------+-----------+----------+---------------+ Radial        Full                                                  +----------+------------+---------+-----------+----------+---------------+ Ulnar                                               patent by Color +----------+------------+---------+-----------+----------+---------------+ Cephalic      Full                                                  +----------+------------+---------+-----------+----------+---------------+ Basilic       Full                                                  +----------+------------+---------+-----------+----------+---------------+  Summary:  Right: No evidence of thrombosis in the subclavian.  Left: No evidence of deep vein thrombosis in the upper extremity. No evidence of superficial vein thrombosis in the upper extremity. Interstitial fluid noted throughout forearm.  *See table(s) above for measurements and observations.  Diagnosing physician: Deitra Mayo MD Electronically signed by Deitra Mayo MD on 12/12/2019 at 6:51:23 PM.    Final      Assessment/Plan: Diagnosis: R thalamic intracranial hemorrhagge 1. Does the need for close, 24 hr/day medical supervision in concert with the patient's rehab  needs make it unreasonable for this patient to be served  in a less intensive setting? Yes 2. Co-Morbidities requiring supervision/potential complications: HTN, right sided flaccidity, right gaze preference, cerebral edema, hypoxic respiratory failure, aspiration pneumonia, fever, leukocytosis 3. Due to bladder management, bowel management, safety, skin/wound care, disease management, medication administration, pain management and patient education, does the patient require 24 hr/day rehab nursing? Yes 4. Does the patient require coordinated care of a physician, rehab nurse, therapy disciplines of PT, OT, SLP to address physical and functional deficits in the context of the above medical diagnosis(es)? Yes Addressing deficits in the following areas: balance, endurance, locomotion, strength, transferring, bowel/bladder control, bathing, dressing, feeding, grooming, toileting, cognition and psychosocial support 5. Can the patient actively participate in an intensive therapy program of at least 3 hrs of therapy per day at least 5 days per week? Yes 6. The potential for patient to make measurable gains while on inpatient rehab is excellent 7. Anticipated functional outcomes upon discharge from inpatient rehab are modified independent  with PT, modified independent with OT, modified independent with SLP. 8. Estimated rehab length of stay to reach the above functional goals is: 2-3 weeks 9. Anticipated discharge destination: Home 10. Overall Rehab/Functional Prognosis: excellent  RECOMMENDATIONS: This patient's condition is appropriate for continued rehabilitative care in the following setting: CIR Patient has agreed to participate in recommended program. Yes Note that insurance prior authorization may be required for reimbursement for recommended care.  Comment: Mr. Hislop would be an excellent CIR candidate. He will have support upon discharge. He is complaining of neck pain due to positioning and would  benefit from a heating pad (I will place order). He has been sleeping poorly at night and would like Melatonin (I will place order).  Thank you for this consult. We will continue to follow in Mr. Hy' care.  Lavon Paganini Angiulli, PA-C 12/13/2019   I have personally performed a face to face diagnostic evaluation, including, but not limited to relevant history and physical exam findings, of this patient and developed relevant assessment and plan.  Additionally, I have reviewed and concur with the physician assistant's documentation above.  Leeroy Cha, MD

## 2019-12-13 NOTE — Progress Notes (Signed)
  Speech Language Pathology Treatment: Dysphagia;Cognitive-Linquistic  Patient Details Name: Dennis Mercado MRN: [id number] DOB: 10-11-1980 Today's Date: 12/13/2019 Time: 3500-9381 SLP Time Calculation (min) (ACUTE ONLY): 18 min  Assessment / Plan / Recommendation Clinical Impression  Pt is less impulsive this afternoon, needing Min cues to take smaller bites and attend to small amounts of residue and anterior loss on the L side. Pt had no overt s/s of aspiration and RN says that he has been taking meds whole.   Pt also needed Min cues to locate objects on the L side of his tray. Encouraged his wife to sit on that side to bring his attention that way. Cognitively he is also oriented x4 today but needs Mod cues for recall of daily events. He will continue to benefit from SLP f/u acutely and at [facility].   HPI HPI: 39 y.o. male with a PMHx of HTN who presents via EMS as a Code Stroke after acute onset of left sided weakness and headache within minutes of finishing coitus with his wife. CT head reveals an acute hemorrhage centered in the right thalamus, extending into the ventricles, with local mass effect without midline shift. Pt required intubation 6/22-6/25 due to ARF felt to be secondary to aspiration PNA.       SLP Plan  Continue with current plan of care       Recommendations  Diet recommendations: Dysphagia 2 (fine chop);Thin liquid Liquids provided via: Cup;Straw Medication Administration: Whole meds with liquid Supervision: Patient able to self feed;Full supervision/cueing for compensatory strategies Compensations: Slow rate;Small sips/bites;Lingual sweep for clearance of pocketing Postural Changes and/or Swallow Maneuvers: Seated upright 90 degrees                Oral Care Recommendations: Oral care BID Follow up Recommendations: Inpatient Rehab SLP Visit Diagnosis: Dysphagia, oral phase (R13.11);Cognitive communication deficit (R41.841) Plan: Continue with current plan of  care       GO                Osie Bond., M.A. [place] [facility] Pager [phone] Office [phone]  12/13/2019, 10:41 AM

## 2019-12-13 NOTE — Progress Notes (Signed)
STROKE TEAM PROGRESS NOTE   INTERVAL HISTORY RN at bedside. Pt awake alert orientated, no more fever, left LE some movement now. BP under control with multiple BP meds. Off precedex. Will d/c central line.    Vitals:   12/13/19 0745 12/13/19 0800 12/13/19 0845 12/13/19 0900  BP: (!) 150/102 (!) 144/108 (!) 166/108 (!) 152/100  Pulse: 83 73 84 81  Resp: (!) 28 (!) 22 20 (!) 26  Temp:  99.6 F (37.6 C)    TempSrc:  Oral    SpO2: 96% 97% 96% 96%  Weight:      Height:       CBC:  Recent Labs  Lab 12/08/19 0518 12/09/19 0444 12/10/19 0423 12/10/19 0423 12/12/19 0500 12/13/19 0550  WBC 12.8*   < > 11.4*   < > 11.8* 9.7  NEUTROABS 10.4*  --  9.0*  --   --   --   HGB 12.4*   < > 13.2   < > 13.7 13.3  HCT 41.8   < > 43.7   < > 44.2 41.9  MCV 91.7   < > 89.2   < > 87.2 85.5  PLT 265   < > 275   < > 288 294   < > = values in this interval not displayed.   Basic Metabolic Panel:  Recent Labs  Lab 12/09/19 1635 12/09/19 2157 12/10/19 0423 12/10/19 1020 12/12/19 0500 12/12/19 0500 12/12/19 0920 12/13/19 0550  NA 153*   < > 155*   < > 154*   < > 155* 148*  K  --   --  3.7   < > 3.6  --   --  2.9*  CL  --   --  119*   < > 120*  --   --  112*  CO2  --   --  26   < > 24  --   --  24  GLUCOSE  --   --  94   < > 130*  --   --  105*  BUN  --   --  22*   < > 35*  --   --  26*  CREATININE  --   --  1.51*   < > 1.52*  --   --  1.45*  CALCIUM  --   --  8.7*   < > 8.8*  --   --  8.8*  MG 2.2  --  2.1  --   --   --   --   --   PHOS 3.9  --  3.1  --   --   --   --   --    < > = values in this interval not displayed.   Lipid Panel:     Component Value Date/Time   CHOL 232 (H) 12/06/2019 1203   TRIG 294 (H) 12/13/2019 0550   HDL 42 12/06/2019 1203   CHOLHDL 5.5 12/06/2019 1203   VLDL UNABLE TO CALCULATE IF TRIGLYCERIDE OVER 400 mg/dL 12/06/2019 1203   LDLCALC UNABLE TO CALCULATE IF TRIGLYCERIDE OVER 400 mg/dL 12/06/2019 1203   HgbA1c:  Lab Results  Component Value Date    HGBA1C 5.6 12/06/2019   Urine Drug Screen:     Component Value Date/Time   LABOPIA NONE DETECTED 12/11/2019 1103   COCAINSCRNUR NONE DETECTED 12/11/2019 1103   LABBENZ POSITIVE (A) 12/11/2019 1103   AMPHETMU NONE DETECTED 12/11/2019 1103   [place] DETECTED 12/11/2019 1103  LABBARB NONE DETECTED 12/11/2019 1103    Alcohol Level     Component Value Date/Time   ETH <10 12/06/2019 1203     IMAGING past 24 hours  DG Swallowing Func-Speech Pathology  Result Date: 12/12/2019 Objective Swallowing Evaluation: Type of Study: MBS-Modified Barium Swallow Study  Patient Details Name: Alecxis Baltzell MRN: [id number] Date of Birth: [birth year] Today's Date: 12/12/2019 Time: SLP Start Time (ACUTE ONLY): 1130 -SLP Stop Time (ACUTE ONLY): 1200 SLP Time Calculation (min) (ACUTE ONLY): 30 min Past Medical History: No past medical history on file. Past Surgical History: No past surgical history on file. HPI: Jivan Symanski is an 39 y.o. male with a PMHx of HTN who presents via EMS as a Code Stroke after acute onset of left sided weakness and headache.  His wife noted that his left side was weak and that he was starting to look altered. Left facial weakness was also noted. His wife then called EMS. On arrival EMS noted the same deficits. Vitals per EMS: BP 207/148, HR 92, 97% on RA, CBG 122, afebrile. LKN was 0315.  Initial CT of the head was showing Acute hemorrhage centered in the right thalamus measures 3.6 x 2.8 x 2.6 cm. Estimated volume is 13.1 mL.  Hemorrhage extends into the ventricles and can be seen in the  Subjective: The patient was seen in radiology sitting upright in a chair.  Assessment / Plan / Recommendation CHL IP CLINICAL IMPRESSIONS 12/12/2019 Clinical Impression MBS was completed using thin liquids via spoon, self fed cup and straw sips, pureed material and dual textured solids.  He presented with a mild oral dysphagia.  Transient penetration was seen during the swallow given thin liquids via  spoon, cup and straw.  Recommend a dysphagia 2 diet with thin liquids.  Suggest full supervision for intake due to his impulsivity to pace intake.  ST will follow during acute stay and he would benefit from continued follow up at the next level of care.  Please see below for information regarding swallow physiology.  ORAL PHASE -Fair lingual control particularly given thin liquids.  This led to poor bolus control and allowed material to fall the level of the pyriform sinuses prior to the swallow trigger. -Mildly reduced lingual movement which led to a delay in bolus transport particularly seen during dual textured bolus trials. -Oral residue across texture trials.  He was generally sensate to this residue and able to recollect and clear oral cavity when there was a significant amount of residue.  PHARYNGEAL PHASE -Functional pharyngeal swallow.  No pharyngeal residue was seen post swallow. -Timely swallow trigger. -Transient penetration of thin liquids during the swallow.  This material was noted to completely clear the laryngeal vestibule. ESOPHAGEAL PHASE -Sweep revealed it slow to clear.   SLP Visit Diagnosis Dysphagia, oral phase (R13.11) Attention and concentration deficit following -- Frontal lobe and executive function deficit following -- Impact on safety and function Mild aspiration risk   CHL IP TREATMENT RECOMMENDATION 12/12/2019 Treatment Recommendations Therapy as outlined in treatment plan below   Prognosis 12/12/2019 Prognosis for Safe Diet Advancement Good Barriers to Reach Goals Cognitive deficits Barriers/Prognosis Comment -- CHL IP DIET RECOMMENDATION 12/12/2019 SLP Diet Recommendations Dysphagia 2 (Fine chop) solids;Thin liquid Liquid Administration via Cup;Straw Medication Administration Whole meds with liquid Compensations Slow rate;Small sips/bites Postural Changes Remain semi-upright after after feeds/meals (Comment);Seated upright at 90 degrees   CHL IP OTHER RECOMMENDATIONS 12/12/2019  Recommended Consults -- Oral Care Recommendations Oral care BID Other Recommendations Have oral  suction available   CHL IP FOLLOW UP RECOMMENDATIONS 12/12/2019 Follow up Recommendations Inpatient Rehab   CHL IP FREQUENCY AND DURATION 12/12/2019 Speech Therapy Frequency (ACUTE ONLY) min 2x/week Treatment Duration 2 weeks      CHL IP ORAL PHASE 12/12/2019 Oral Phase Impaired Oral - Pudding Teaspoon -- Oral - Pudding Cup -- Oral - Honey Teaspoon -- Oral - Honey Cup -- Oral - Nectar Teaspoon -- Oral - Nectar Cup -- Oral - Nectar Straw -- Oral - Thin Teaspoon Decreased bolus cohesion;Premature spillage;Lingual/palatal residue Oral - Thin Cup Decreased bolus cohesion;Premature spillage;Lingual/palatal residue Oral - Thin Straw Premature spillage;Decreased bolus cohesion;Lingual/palatal residue Oral - Puree Decreased bolus cohesion;Lingual/palatal residue;Delayed oral transit Oral - Mech Soft -- Oral - Regular -- Oral - Multi-Consistency Lingual/palatal residue;Decreased bolus cohesion;Delayed oral transit Oral - Pill -- Oral Phase - Comment --  CHL IP PHARYNGEAL PHASE 12/12/2019 Pharyngeal Phase WFL Pharyngeal- Pudding Teaspoon -- Pharyngeal -- Pharyngeal- Pudding Cup -- Pharyngeal -- Pharyngeal- Honey Teaspoon -- Pharyngeal -- Pharyngeal- Honey Cup -- Pharyngeal -- Pharyngeal- Nectar Teaspoon -- Pharyngeal -- Pharyngeal- Nectar Cup -- Pharyngeal -- Pharyngeal- Nectar Straw -- Pharyngeal -- Pharyngeal- Thin Teaspoon -- Pharyngeal -- Pharyngeal- Thin Cup -- Pharyngeal -- Pharyngeal- Thin Straw -- Pharyngeal -- Pharyngeal- Puree -- Pharyngeal -- Pharyngeal- Mechanical Soft -- Pharyngeal -- Pharyngeal- Regular -- Pharyngeal -- Pharyngeal- Multi-consistency -- Pharyngeal -- Pharyngeal- Pill -- Pharyngeal -- Pharyngeal Comment --  CHL IP CERVICAL ESOPHAGEAL PHASE 12/12/2019 Cervical Esophageal Phase WFL Pudding Teaspoon -- Pudding Cup -- Honey Teaspoon -- Honey Cup -- Nectar Teaspoon -- Nectar Cup -- Nectar Straw -- Thin Teaspoon  -- Thin Cup -- Thin Straw -- Puree -- Mechanical Soft -- Regular -- Multi-consistency -- Pill -- Cervical Esophageal Comment -- Shelly Flatten, MA, CCC-SLP Acute Rehab SLP [phone] Lamar Sprinkles 12/12/2019, 12:19 PM              VAS [place] UPPER EXTREMITY VENOUS DUPLEX  Result Date: 12/12/2019 UPPER VENOUS STUDY  Indications: Edema, and ICH Limitations: Line. Comparison Study: No prior study on file for comparison Performing Technologist: Sharion Dove RVS  Examination Guidelines: A complete evaluation includes B-mode imaging, spectral Doppler, color Doppler, and power Doppler as needed of all accessible portions of each vessel. Bilateral testing is considered an integral part of a complete examination. Limited examinations for reoccurring indications may be performed as noted.  Right Findings: +----------+------------+---------+-----------+----------+-------+ RIGHT     CompressiblePhasicitySpontaneousPropertiesSummary +----------+------------+---------+-----------+----------+-------+ Subclavian               Yes       Yes                      +----------+------------+---------+-----------+----------+-------+  Left Findings: +----------+------------+---------+-----------+----------+---------------+ LEFT      CompressiblePhasicitySpontaneousProperties    Summary     +----------+------------+---------+-----------+----------+---------------+ IJV                      Yes       Yes                              +----------+------------+---------+-----------+----------+---------------+ Subclavian               Yes       Yes                              +----------+------------+---------+-----------+----------+---------------+ Axillary  Yes       Yes                              +----------+------------+---------+-----------+----------+---------------+ Brachial      Full       Yes       Yes                               +----------+------------+---------+-----------+----------+---------------+ Radial        Full                                                  +----------+------------+---------+-----------+----------+---------------+ Ulnar                                               patent by Color +----------+------------+---------+-----------+----------+---------------+ Cephalic      Full                                                  +----------+------------+---------+-----------+----------+---------------+ Basilic       Full                                                  +----------+------------+---------+-----------+----------+---------------+  Summary:  Right: No evidence of thrombosis in the subclavian.  Left: No evidence of deep vein thrombosis in the upper extremity. No evidence of superficial vein thrombosis in the upper extremity. Interstitial fluid noted throughout forearm.  *See table(s) above for measurements and observations.  Diagnosing physician: Deitra Mayo MD Electronically signed by Deitra Mayo MD on 12/12/2019 at 6:51:23 PM.    Final      MRI WO Contrast  12/08/19 IMPRESSION: 1. Unchanged size of right thalamic intraparenchymal hematoma with mild surrounding edema. No hydrocephalus or ventricular entrapment. 2. 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  Bilateral Carotid Dopplers  12/08/19 Summary:  Right Carotid: Velocities in the right ICA are consistent with a 1-39% stenosis.  Left Carotid: Velocities in the visualized segment of the left ICA are consistent with a 1-39% stenosis.  Vertebrals: Right vertebral artery demonstrates antegrade flow. Left vertebral artery was not visualized.  Subclavians: Left subclavian artery was not visualized. Normal flow hemodynamics were seen in the right subclavian artery.   Portable CXR 12/08/19 IMPRESSION: Stable support apparatus. Stable large left lung opacity  concerning for pneumonia. Improved right basilar opacity is noted. No significant pleural effusion is noted.  PHYSICAL EXAM  Temp:  [98.4 F (36.9 C)-99.6 F (37.6 C)] 99.6 F (37.6 C) (06/28 0800) Pulse Rate:  [27-103] 81 (06/28 0900) Resp:  [14-29] 26 (06/28 0900) BP: (119-190)/(79-119) 152/100 (06/28 0900) SpO2:  [37 %-100 %] 96 % (06/28 0900)  General - Well nourished, well developed, not in distress.  Ophthalmologic - fundi not visualized due to noncooperation.  Cardiovascular - Regular rhythm and rate.  Neuro - awake alert, orientated x 4.  No aphasia, following commands, able to repeat and name. Right gaze preference but able to cross midline, left hemianopia. Left facial droop. Tongue midline. Left UE 0/5 and LLE 1/5 proximal, 2-/5 knee extension, 0/5 distal. Right UE and LE at least 4/5. DTR 1+ and no babinski. Sensation subjectively symmetrical. Right FTN intact. Increased muscle tone LUE and LLE. Gait not tested.   ASSESSMENT/PLAN Mr. Tavone Caesar is a 39 y.o. male with recent HTN at dentist office with plans for PCP visit next week, presenting post coitus with L sided weakness, elevated BP.   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 IVH. Local mass effect w/o midline shift  CTA head Unremarkable   CT head at 12h stable  CT head 6/22 0440 stable R thalamic hemorrhage, IVH. Sllight increased in ventricle size. 4-5 midline shift  CT head 6/22 remains stable     MRI 6/23 - Unchanged size of right thalamic ICH with mild surrounding edema. No hydrocephalus or ventricular entrapment.   CT Head 6/26 - No substantial change  EEG - severe diffuse encephalopathy  Carotid Doppler - unremarkable  2D Echo EF 60-65%. No source of embolus    LDL 157  HgbA1c 5.6   Heparin subq for VTE prophylaxis  No antithrombotic prior to admission, now on No antithrombotic given hemorrhage   Therapy recommendations: CIR    Disposition:  pending   Cerebral Edema   On 3% protocol -> now off   Now on NS  NA [phone] Allow Na gradually trending down  Repeat CT 6/26 stable   MRI unchanged size with mild edema  Hypoxic Respiratory Failure Aspiration PNA Fever  leukocytosis  Emergently intubated 6/22 -> extubated 6/25  CT chest L consolidation   Bronchoscopy, BAL to LLL  Tmax - 101.8->100.2 ->101.9->101.7->afebrile  WBC - 20.6->12.8->15.7->11.4->11.8->9.7  Unasyn 6/22>>    CXR - 6/23 - Stable large left lung opacity concerning for pneumonia. Improved right basilar opacity is noted  CXR 6/26 Significant improvement compared to the prior study with near resolution of the previously seen hazy left lung airspace opacity  CCM onboard   Hypertensive Emergency   BP as high as 219/162   Home meds:  None  SBP goal < 160  Now off cleviprex . Now on Coreg 25 mg Bid ; Norvasc 10 mg QD ; Hydralazine 100 mg Q8 hrs, clonidine 0.2 bid, lisinopril 10 . Labetalol prn ; Hydralazine prn . Long-term BP goal normotensive   Seizure   Agitation following extubation 6/25  L trapezius muscle contractions, extending to R trapezius, with subsequent Upper and lower extremity rigidity with rhythmic contraction. Pt unresponsive, upward deviated gaze. C/w seizure   S/p ativan  On depakote 500 Q6h -> 750 Q8h -> off  Overlap depakote with keppra -> transition to keppra  EEG severe diffuse encephalopathy  Depakote level - 39     Hyperlipidemia  Home meds: none   LDL 157  Hold statin in setting of acute hemorrhage   Consider statin at d/c or f/u appt  Dysphagia . Secondary to stroke . Speech on board . Pt removed cortrak -> TF stopped  . On gentle IVF . Now on dys2 and thin liquid  AKI vs. CKD  Creatinine 1.4-1.57-1.74->1.52->1.45  Continue IVF  BMP monitoring  Other Stroke Risk Factors  ETOH use, advised to drink no more than 2 drink(s) a  day  Substance abuse - UDS:  THC Positive. Patient will be  advised to stop using due to stroke risk.  Other Active Problems  Hypokalemia 3.2-2.9-4.1->3.7 ->3.0->3.6->2.9 - supplement  Hospital day # 7  Patient continues to be critically ill for the last 24 hours, has developed BP fluctuation needing labetalol and hydralazine IV PRN, continues to have left hemiplegia and AKI, and I have changed depakote to keppra, adjusted BP meds, d/c central line. I discussed with Dr. Lynetta Mare    This patient is critically ill due to Velma, IVH, cerebral edema, seizure, dysphagia, fever, leukocytosis, AKI, respiratory failure and at significant risk of neurological worsening, death form hematoma expansion, hydrocephalus, cerebral edema, brain herniation, status epilepticus, sepsis. This patient's care requires constant monitoring of vital signs, hemodynamics, respiratory and cardiac monitoring, review of multiple databases, neurological assessment, discussion with family, other specialists and medical decision making of high complexity. I spent 35 minutes of neurocritical care time in the care of this patient.   Rosalin Hawking, MD PhD Stroke Neurology 12/13/2019 10:08 AM    To contact Stroke Continuity provider, please refer to [email or web address] After hours, contact General Neurology

## 2019-12-13 NOTE — Progress Notes (Signed)
Nutrition Follow-up  DOCUMENTATION CODES:   Not applicable  INTERVENTION:   Ensure Enlive po BID, each supplement provides 350 kcal and 20 grams of protein  Encourage PO intake   NUTRITION DIAGNOSIS:   Inadequate oral intake related to decreased appetite as evidenced by meal completion < 50%.  Ongoing.   GOAL:   Patient will meet greater than or equal to 90% of their needs  Progressing.   MONITOR:   PO intake, Supplement acceptance  REASON FOR ASSESSMENT:   Ventilator, Consult Enteral/tube feeding initiation and management  ASSESSMENT:   39 year old male who presented on 6/21 as a Code Stroke. PMH of HTN. 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  Pt discussed during ICU rounds and with RN.   Per pt and visitor pt with decreased appetite eating about 25% of meals. Pt states he likes ensure and willing to drink these.    Medications reviewed and include: 40 mEq KCl every 4 hours, senokot Labs reviewed: Na 148 (H) , K+ 2.9 (L) CBG's: [phone]  Diet Order:   Diet Order            DIET DYS 2 Room service appropriate? Yes with Assist; Fluid consistency: Thin  Diet effective now                 EDUCATION NEEDS:   No education needs have been identified at this time  Skin:  Skin Assessment: Reviewed RN Assessment  Last BM:  6/26  Height:   Ht Readings from Last 1 Encounters:  12/06/19 5\' 7"  (1.702 m)    Weight:   Wt Readings from Last 1 Encounters:  12/12/19 74 kg    Ideal Body Weight:  67.3 kg  BMI:  Body mass index is 25.55 kg/m.  Estimated Nutritional Needs:   Kcal:  2100-2300  Protein:  90-110 grams  Fluid:  >/= 2.0 L  Pollyanna Levay P., RD, LDN, CNSC See AMiON for contact information

## 2019-12-13 NOTE — Progress Notes (Signed)
NAME:  Dennis Mercado, MRN:  [id number], DOB:  August 17, 1980, LOS: 7 ADMISSION DATE:  12/06/2019, CONSULTATION DATE: 12/07/2018 REFERRING MD: Neurology, CHIEF COMPLAINT: Status post intracerebral hemorrhage, hypoxia respiratory distress.  Brief History   39 year old with acute onset of left-sided weakness right thalami bleed admitted to the hospital for further evaluation and treatment.  History of present illness   39 year old male following intercourse with his wife felt ill and had left-sided weakness.  Brought to the hospital found to have a right thalami bleed which time neurology admitted him to the [facility] intensive care unit.  His mental status declined his respiratory status declined pulmonary critical care was called to bedside and found him tachypneic agitated and hypoxic.  He was electively intubated after discussions with him so would be able to travel to CT scan safely.  He also had a central line placed for 3% saline.  He may need interventricular drain plus or minus surgical interventions per neurosurgery.  Pulmonary critical care asked to consult he was intubated central line placed he is hemodynamically stable and being transferred to x-ray for further treatment evaluation.  Past Medical History  Noncompliant with medications hypertension  Significant Hospital Events   Intubated 12/07/2019 Extubated 6/25 Seizure 6/25  Consults:  12/07/2019 neurosurgery 12/07/2019 CCM  Procedures:  12/07/2019 intubation 12/07/2018 with left internal jugular central line placed  Significant Diagnostic Tests:  CT of the head with right ophthalmic bleeding  Micro Data:  6/22 fob>> no orgs seen  Antimicrobials:  6/22 unasyn>>  Interim history/subjective:   Calmer, now off both Cleviprex and Precedex. Patient denies pain, states appetite still poor.   Objective   Blood pressure (!) 150/89, pulse 91, temperature 100 F (37.8 C), temperature source Axillary, resp. rate (!) 24, height 5\' 7"   (1.702 m), weight 74 kg, SpO2 99 %.        Intake/Output Summary (Last 24 hours) at 12/13/2019 1330 Last data filed at 12/13/2019 1100 Gross per 24 hour  Intake 2024.68 ml  Output 2700 ml  Net -675.32 ml   Filed Weights   12/06/19 0455 12/06/19 0940 12/12/19 0500  Weight: 75.5 kg 75.1 kg 74 kg    Examination: General: laying in bed  HENT: no icterus.  NEURO: left hemiplegia awake and fully conversant, speech fluent with no dysarthria.  Lungs: clear clear.  Cardiovascular: RRR, s1 s2 Abdomen: soft , nt nd  Extremities: no edema  GU: condom cath   [facility] Problem list     Assessment & Plan:   Was critically ill due to delirium requiring titration of sedative infusions.  Now calm off Precedex. - Ready for transfer.   Was critically ill due to Acute Hypoxemic Respiratory Failure, LLL Aspiration pneumonia, dense consolidation  - complete 7 days unasyn and stop  Was critically ill due to poorly controlled hypertension requiring titration of clevidipine -Increased BP med to achieve goal of SBP<140.  - Continue with PRN  Acute right thalamus intracranial hemorrhage on 12/07/2019 - CT and  MRI stable  - PT/OT, needs rehabilitation  Best practice:  Diet: NPO, dietary just by and awaiting recc's but likely thick/pureed Pain/Anxiety/Delirium protocol (if indicated): none. VAP protocol (if indicated): not at this time DVT prophylaxis: Mechanical only  GI prophylaxis: PPI Glucose control: cbgs with ssi  Mobility: Bedrest, will initiate PT/OT Code Status: Full Family Communication: patient updated at bedside.   Disposition: transfer to floor - will sign off today.   Labs   CBC: Recent Labs  Lab 12/08/19 0518  12/09/19 0444 12/10/19 0423 12/12/19 0500 12/13/19 0550  WBC 12.8* 15.7* 11.4* 11.8* 9.7  NEUTROABS 10.4*  --  9.0*  --   --   HGB 12.4* 13.1 13.2 13.7 13.3  HCT 41.8 44.1 43.7 44.2 41.9  MCV 91.7 90.4 89.2 87.2 85.5  PLT 265 279 275 288 294     Basic Metabolic Panel: Recent Labs  Lab 12/08/19 0518 12/08/19 1024 12/08/19 1711 12/09/19 0006 12/09/19 0444 12/09/19 1030 12/09/19 1635 12/09/19 2157 12/10/19 0423 12/10/19 1020 12/11/19 0326 12/11/19 0800 12/11/19 2007 12/12/19 0200 12/12/19 0500 12/12/19 0920 12/13/19 0550  NA 156*   < > 151*   < > 150*   < > 153*   < > 155*   < > 157*   < > 155* 155* 154* 155* 148*  K 4.2  --   --   --  3.8  --   --   --  3.7  --  3.0*  --   --   --  3.6  --  2.9*  CL 126*  --   --   --  113*  --   --   --  119*  --  116*  --   --   --  120*  --  112*  CO2 24  --   --   --  27  --   --   --  26  --  24  --   --   --  24  --  24  GLUCOSE 95  --   --   --  146*  --   --   --  94  --  105*  --   --   --  130*  --  105*  BUN 13  --   --   --  15  --   --   --  22*  --  28*  --   --   --  35*  --  26*  CREATININE 1.57*  --   --   --  1.49*  --   --   --  1.51*  --  1.74*  --   --   --  1.52*  --  1.45*  CALCIUM 8.3*  --   --   --  9.0  --   --   --  8.7*  --  8.6*  --   --   --  8.8*  --  8.8*  MG 2.0  --  1.8  --  2.0  --  2.2  --  2.1  --   --   --   --   --   --   --   --   PHOS 3.3  --  3.4  --  4.6  --  3.9  --  3.1  --   --   --   --   --   --   --   --    < > = values in this interval not displayed.   GFR: Estimated Creatinine Clearance: 64.6 mL/min (A) (by C-G formula based on SCr of 1.45 mg/dL (H)). Recent Labs  Lab 12/09/19 0444 12/10/19 0423 12/12/19 0500 12/13/19 0550  WBC 15.7* 11.4* 11.8* 9.7    Liver Function Tests: No results for input(s): AST, ALT, ALKPHOS, BILITOT, PROT, ALBUMIN in the last 168 hours. No results for input(s): LIPASE, AMYLASE in the last 168 hours. No results for input(s): AMMONIA in the last 168  hours.  ABG    Component Value Date/Time   PHART 7.316 (L) 12/08/2019 0359   PCO2ART 48.1 (H) 12/08/2019 0359   PO2ART 146 (H) 12/08/2019 0359   HCO3 24.4 12/08/2019 0359   TCO2 26 12/08/2019 0359   ACIDBASEDEF 2.0 12/08/2019 0359   O2SAT 99.0  12/08/2019 0359     Coagulation Profile: No results for input(s): INR, PROTIME in the last 168 hours.  Cardiac Enzymes: No results for input(s): CKTOTAL, CKMB, CKMBINDEX, TROPONINI in the last 168 hours.  HbA1C: Hgb A1c MFr Bld  Date/Time Value Ref Range Status  12/06/2019 12:03 PM 5.6 4.8 - 5.6 % Final    Comment:    (NOTE)         Prediabetes: 5.7 - 6.4         Diabetes: >6.4         Glycemic control for adults with diabetes: <7.0     CBG: Recent Labs  Lab 12/12/19 1936 12/12/19 2316 12/13/19 0328 12/13/19 0807 12/13/19 1200  GLUCAP 96 119* 143* 119* 140*    Kipp Brood, MD [facility] ICU Physician [place]  Pager: [phone] Mobile: [phone] After hours: [phone].  12/13/2019, 1:44 PM

## 2019-12-13 NOTE — Progress Notes (Signed)
Physical Therapy Treatment Patient Details Name: Dennis Mercado MRN: [id number] DOB: 1980-08-24 Today's Date: 12/13/2019    History of Present Illness 39 y.o. male with a PMHx of HTN who presents via EMS as a Code Stroke after acute onset of left sided weakness and headache within minutes of finishing coitus with his wife. CT head reveals an acute hemorrhage centered in the right thalamus, extending into the ventricles, with local mass effect without midline shift. Pt required intubation 6/22 due to ARF secondary to aspiration PNA. PMH non contributory     PT Comments    Pt with significant improvement both cognitively and functionally. Pt attentive and following commands, initiating transfers, and was able to compete std pvt to transfer to chair with mod/maxAx2. Pt aware of L sided weakness but does demonstrate some inattention. Pt with noted L UE increased tone. Acute PT to continue to recommend CIR upon d/c for maximal functional recovery.    Follow Up Recommendations  CIR     Equipment Recommendations  Wheelchair (measurements PT);Wheelchair cushion (measurements PT);Hospital bed    Recommendations for Other Services       Precautions / Restrictions Precautions Precautions: Fall Precaution Comments: Lt hemiparesis (significant sensation deficits on L UE and LE) Restrictions Weight Bearing Restrictions: No    Mobility  Bed Mobility Overal bed mobility: Needs Assistance Bed Mobility: Supine to Sit     Supine to sit: Max assist;+2 for physical assistance     General bed mobility comments: attempted to teach pt how to hook R foot under left to assist however pt continued to require mod/maxA for LE management and mod/maxA for trunk elevation with HOB elevated at 45 deg for safe transfer to EOB, modA to maintain EOB balance, pt with L lateral lean  Transfers Overall transfer level: Needs assistance Equipment used:  (2 person lift with gait belt and bed pad) Transfers: Sit  to/from [employer] Transfers Sit to Stand: Modified independent (Device/Increase time);Mod assist;+2 physical assistance Stand pivot transfers: Mod assist;Max assist;+2 physical assistance       General transfer comment: pt with good power up with R LE and use of R UE, L knee blocked, pt unable to use L UE functionally. Pt able to step with R LE, modA for L LE advancement ot chair  Ambulation/Gait             General Gait Details: unable at this time   Stairs             Wheelchair Mobility    Modified Rankin (Stroke Patients Only) Modified Rankin (Stroke Patients Only) Pre-Morbid Rankin Score: No symptoms Modified Rankin: Severe disability     Balance Overall balance assessment: Needs assistance Sitting-balance support: Feet supported;Single extremity supported Sitting balance-Leahy Scale: Poor Sitting balance - Comments: max directional cues to achieve and maintain midline, modA initially transitioning to minA for support Postural control: Left lateral lean Standing balance support: Single extremity supported Standing balance-Leahy Scale: Poor Standing balance comment: dependent on physical assist                            Cognition Arousal/Alertness: Awake/alert Behavior During Therapy: WFL for tasks assessed/performed Overall Cognitive Status: Impaired/Different from baseline Area of Impairment: Problem solving;Awareness;Safety/judgement                         Safety/Judgement: Decreased awareness of safety;Decreased awareness of deficits (L hemi) Awareness: Emergent Problem Solving: Decreased  initiation;Difficulty sequencing;Requires verbal cues;Requires tactile cues General Comments: pt more attentive to PT, able to carry on coversation, able to follow commands with delayed processing      Exercises      General Comments General comments (skin integrity, edema, etc.): VSS, L UE/hand swollen      Pertinent  Vitals/Pain Pain Assessment: Faces Faces Pain Scale: No hurt    Home Living                      Prior Function            PT Goals (current goals can now be found in the care plan section) Progress towards PT goals: Progressing toward goals    Frequency    Min 4X/week      PT Plan Current plan remains appropriate    Co-evaluation              AM-PAC PT "6 Clicks" Mobility   Outcome Measure  Help needed turning from your back to your side while in a flat bed without using bedrails?: Total Help needed moving from lying on your back to sitting on the side of a flat bed without using bedrails?: Total Help needed moving to and from a bed to a chair (including a wheelchair)?: Total Help needed standing up from a chair using your arms (e.g., wheelchair or bedside chair)?: Total Help needed to walk in hospital room?: Total Help needed climbing 3-5 steps with a railing? : Total 6 Click Score: 6    End of Session Equipment Utilized During Treatment: Gait belt Activity Tolerance: Patient tolerated treatment well Patient left: in chair;with call bell/phone within reach;with chair alarm set;with family/visitor present Nurse Communication: Mobility status PT Visit Diagnosis: Other abnormalities of gait and mobility (R26.89);Muscle weakness (generalized) (M62.81);Other symptoms and signs involving the nervous system (R29.898);Hemiplegia and hemiparesis Hemiplegia - Right/Left: Left Hemiplegia - caused by: Nontraumatic intracerebral hemorrhage     Time: 3383-2919 PT Time Calculation (min) (ACUTE ONLY): 25 min  Charges:  $Therapeutic Activity: 8-22 mins $Neuromuscular Re-education: 8-22 mins                     [name], PT, DPT Acute Rehabilitation Services Pager #: [phone] Office #: [phone]    [name] 12/13/2019, 12:59 PM

## 2019-12-14 DIAGNOSIS — R0602 Shortness of breath: Secondary | ICD-10-CM

## 2019-12-14 DIAGNOSIS — N289 Disorder of kidney and ureter, unspecified: Secondary | ICD-10-CM

## 2019-12-14 LAB — BASIC METABOLIC PANEL
Anion gap: 13 (ref 5–15)
BUN: 22 mg/dL — ABNORMAL HIGH (ref 6–20)
CO2: 17 mmol/L — ABNORMAL LOW (ref 22–32)
Calcium: 8.5 mg/dL — ABNORMAL LOW (ref 8.9–10.3)
Chloride: 111 mmol/L (ref 98–111)
Creatinine, Ser: 1.38 mg/dL — ABNORMAL HIGH (ref 0.61–1.24)
GFR calc Af Amer: >60 mL/min (ref >60)
GFR calc non Af Amer: >60 mL/min (ref >60)
Glucose, Bld: 166 mg/dL — ABNORMAL HIGH (ref 70–99)
Potassium: 3.5 mmol/L (ref 3.5–5.1)
Sodium: 141 mmol/L (ref 135–145)

## 2019-12-14 LAB — ALBUMIN: Albumin: 2.9 g/dL — ABNORMAL LOW (ref 3.5–5.0)

## 2019-12-14 LAB — CBC
HCT: 41.2 % (ref 39.0–52.0)
Hemoglobin: 13.1 g/dL (ref 13.0–17.0)
MCH: 26.9 pg (ref 26.0–34.0)
MCHC: 31.8 g/dL (ref 30.0–36.0)
MCV: 84.6 fL (ref 80.0–100.0)
Platelets: 297 10*3/uL (ref 150–400)
RBC: 4.87 MIL/uL (ref 4.22–5.81)
RDW: 14.7 % (ref 11.5–15.5)
WBC: 10 10*3/uL (ref 4.0–10.5)
nRBC: 0.2 % (ref 0.0–0.2)

## 2019-12-14 LAB — PHOSPHORUS: Phosphorus: 2.6 mg/dL (ref 2.5–4.6)

## 2019-12-14 LAB — SODIUM, URINE, RANDOM: Sodium, Ur: 136 mmol/L

## 2019-12-14 LAB — MAGNESIUM: Magnesium: 2.2 mg/dL (ref 1.7–2.4)

## 2019-12-14 MED ORDER — LISINOPRIL 10 MG PO TABS
10.0000 mg | ORAL_TABLET | Freq: Two times a day (BID) | ORAL | Status: DC
  Administered 2019-12-14 – 2019-12-15 (×2): 10 mg via ORAL
  Filled 2019-12-14 (×2): qty 1

## 2019-12-14 MED ORDER — POTASSIUM CHLORIDE CRYS ER 20 MEQ PO TBCR
40.0000 meq | EXTENDED_RELEASE_TABLET | Freq: Once | ORAL | Status: AC
  Administered 2019-12-14: 40 meq via ORAL
  Filled 2019-12-14: qty 2

## 2019-12-14 MED ORDER — CLONIDINE HCL 0.1 MG PO TABS
0.3000 mg | ORAL_TABLET | Freq: Two times a day (BID) | ORAL | Status: DC
  Administered 2019-12-14 – 2019-12-15 (×2): 0.3 mg via ORAL
  Filled 2019-12-14 (×2): qty 3

## 2019-12-14 NOTE — Progress Notes (Signed)
PROGRESS NOTE    Dennis Mercado  JSE:[id number] DOB: 02/11/1981 DOA: 12/06/2019 PCP: Libby Maw, MD  Outpatient Specialists:   Brief Narrative:  Patient is a 39 year old African-American male with past medical history significant for hypertension for which patient was noncompliant compliant with medications.  Patient will was said to have developed left-sided weakness during sexual activity with his wife.  Work-up done revealed right thalamic bleed.  Subsequently, patient became tachypneic, hypoxic with decreased mental status and had to be intubated by the ICU team.  Patient was transferred to the hospitalist team earlier today.  Left-sided hemiplegia persists.  Neurology is directing care.  Patient was seen alongside patient's wife.     Assessment & Plan:   Active Problems:   ICH (intracerebral hemorrhage) (HCC)   Acute respiratory failure (HCC)  Acute right thalamic intracranial hemorrhage: -Stable -Left hemiplegia persists -Pursue CIR.  Hypertensive urgency: -Continue Norvasc 10 mg p.o. once daily -Continue Coreg 25 Mg p.o. twice daily -Continue clonidine 0.3 mg p.o. twice daily. -Hydralazine 100 mg p.o. every 8 hourly.  Hypokalemia: -Continue to monitor and replete. -Repeat potassium is 3.5. -K-Dur 40 M EQ p.o. x1 dose.  Hypernatremia: -Sodium has gone down from 148 to 141. -We will be careful with excessive hydration.  DVT prophylaxis: Subacute heparin Code Status: Full code Family Communication: Wife Disposition Plan: CIR   Consultants:   Neurosurgery  Critical care team  Neurology  Procedures:  Intubation on 12/07/2019 Left IJ central line placement on 12/07/2019.  Antimicrobials:   Patient has completed a 7-day course of Unasyn.   Subjective: No new complaints Resting quietly  Objective: Vitals:   12/14/19 0452 12/14/19 0715 12/14/19 0815 12/14/19 1228  BP: (!) 160/106 (!) 166/93 (!) 162/96 (!) 143/101  Pulse: 87  (!) 116 98    Resp: (!) _0 Temp: 98.5 F (36.9 C) 98.7 F (37.1 C) 98.6 F (37 C) 99.5 F (37.5 C)  TempSrc: Oral   Axillary  SpO2: 98% 100% 100% 100%  Weight:      Height:        Intake/Output Summary (Last 24 hours) at 12/14/2019 1430 Last data filed at 12/14/2019 1116 Gross per 24 hour  Intake 1062.46 ml  Output 1975 ml  Net -912.54 ml   Filed Weights   12/06/19 0455 12/06/19 0940 12/12/19 0500  Weight: 75.5 kg 75.1 kg 74 kg    Examination:  General exam: Appears calm and comfortable.  Left hemiplegia. Respiratory system: Clear to auscultation. Respiratory effort normal. Cardiovascular system: S1 & S2 heard Gastrointestinal system: Abdomen is nondistended, soft and nontender. No organomegaly or masses felt. Normal bowel sounds heard. Central nervous system: Sleeping/resting quietly but wakes up when called.  Left hemiplegia persists.   Extremities: No leg edema  Data Reviewed: I have personally reviewed following labs and imaging studies  CBC: Recent Labs  Lab 12/08/19 0518 12/08/19 0518 12/09/19 0444 12/10/19 0423 12/12/19 0500 12/13/19 0550 12/14/19 0649  WBC 12.8*   < > 15.7* 11.4* 11.8* 9.7 10.0  NEUTROABS 10.4*  --   --  9.0*  --   --   --   HGB 12.4*   < > 13.1 13.2 13.7 13.3 13.1  HCT 41.8   < > 44.1 43.7 44.2 41.9 41.2  MCV 91.7   < > 90.4 89.2 87.2 85.5 84.6  PLT 265   < > 279 275 288 294 297   < > = values in this interval not displayed.   Basic  Metabolic Panel: Recent Labs  Lab 12/08/19 1711 12/09/19 0006 12/09/19 0444 12/09/19 1030 12/09/19 1635 12/09/19 2157 12/10/19 0423 12/10/19 1020 12/11/19 0326 12/11/19 0800 12/12/19 0200 12/12/19 0500 12/12/19 0920 12/13/19 0550 12/14/19 0649  NA 151*   < > 150*   < > 153*   < > 155*   < > 157*   < > 155* 154* 155* 148* 141  K  --   --  3.8  --   --   --  3.7  --  3.0*  --   --  3.6  --  2.9* 3.5  CL  --   --  113*  --   --   --  119*  --  116*  --   --  120*  --  112* 111  CO2  --   --  27   --   --   --  26  --  24  --   --  24  --  24 17*  GLUCOSE  --   --  146*  --   --   --  94  --  105*  --   --  130*  --  105* 166*  BUN  --   --  15  --   --   --  22*  --  28*  --   --  35*  --  26* 22*  CREATININE  --   --  1.49*  --   --   --  1.51*  --  1.74*  --   --  1.52*  --  1.45* 1.38*  CALCIUM  --   --  9.0  --   --   --  8.7*  --  8.6*  --   --  8.8*  --  8.8* 8.5*  MG 1.8  --  2.0  --  2.2  --  2.1  --   --   --   --   --   --   --  2.2  PHOS 3.4  --  4.6  --  3.9  --  3.1  --   --   --   --   --   --   --  2.6   < > = values in this interval not displayed.   GFR: Estimated Creatinine Clearance: 67.9 mL/min (A) (by C-G formula based on SCr of 1.38 mg/dL (H)). Liver Function Tests: Recent Labs  Lab 12/14/19 0649  ALBUMIN 2.9*   No results for input(s): LIPASE, AMYLASE in the last 168 hours. No results for input(s): AMMONIA in the last 168 hours. Coagulation Profile: No results for input(s): INR, PROTIME in the last 168 hours. Cardiac Enzymes: No results for input(s): CKTOTAL, CKMB, CKMBINDEX, TROPONINI in the last 168 hours. BNP (last 3 results) No results for input(s): PROBNP in the last 8760 hours. HbA1C: No results for input(s): HGBA1C in the last 72 hours. CBG: Recent Labs  Lab 12/12/19 2316 12/13/19 0328 12/13/19 0807 12/13/19 1200 12/13/19 1519  GLUCAP 119* 143* 119* 140* 117*   Lipid Profile: Recent Labs    12/13/19 0550  TRIG 294*   Thyroid Function Tests: No results for input(s): TSH, T4TOTAL, FREET4, T3FREE, THYROIDAB in the last 72 hours. Anemia Panel: No results for input(s): VITAMINB12, FOLATE, FERRITIN, TIBC, IRON, RETICCTPCT in the last 72 hours. Urine analysis:    Component Value Date/Time   COLORURINE YELLOW 12/11/2019 1104   [place] 12/11/2019 1104  LABSPEC 1.017 12/11/2019 1104   PHURINE 6.0 12/11/2019 1104   [place] 12/11/2019 [place] 12/11/2019 [place] 12/11/2019 1104    KETONESUR 20 (A) 12/11/2019 1104   PROTEINUR NEGATIVE 12/11/2019 1104   NITRITE NEGATIVE 12/11/2019 1104   LEUKOCYTESUR NEGATIVE 12/11/2019 1104   Sepsis Labs: _0 (procalcitonin:4,lacticidven:4)  ) Recent Results (from the past 240 hour(s))  SARS Coronavirus 2 by RT PCR (hospital order, performed in [place] hospital lab) Nasopharyngeal Nasopharyngeal Swab     Status: None   Collection Time: 12/06/19  4:36 AM   Specimen: Nasopharyngeal Swab  Result Value Ref Range Status   SARS Coronavirus 2 NEGATIVE NEGATIVE Final    Comment: (NOTE) SARS-CoV-2 target nucleic acids are NOT DETECTED.  The SARS-CoV-2 RNA is generally detectable in upper and lower respiratory specimens during the acute phase of infection. The lowest concentration of SARS-CoV-2 viral copies this assay can detect is 250 copies / mL. A negative result does not preclude SARS-CoV-2 infection and should not be used as the sole basis for treatment or other patient management decisions.  A negative result may occur with improper specimen collection / handling, submission of specimen other than nasopharyngeal swab, presence of viral mutation(s) within the areas targeted by this assay, and inadequate number of viral copies (<250 copies / mL). A negative result must be combined with clinical observations, patient history, and epidemiological information.  Fact Sheet for Patients:   StrictlyIdeas.no  Fact Sheet for Healthcare Providers: BankingDealers.co.za  This test is not yet approved or  cleared by the [place] FDA and has been authorized for detection and/or diagnosis of SARS-CoV-2 by FDA under an Emergency Use Authorization (EUA).  This EUA will remain in effect (meaning this test can be used) for the duration of the COVID-19 declaration under Section 564(b)(1) of the Act, 21 U.S.C. section 360bbb-3(b)(1), unless the authorization is terminated or revoked  sooner.  Performed at [facility], [place] [address] [place], [place] [address]   MRSA PCR Screening     Status: None   Collection Time: 12/06/19  9:47 AM   Specimen: Nasal Mucosa; Nasopharyngeal  Result Value Ref Range Status   MRSA by PCR NEGATIVE NEGATIVE Final    Comment:        The GeneXpert MRSA Assay (FDA approved for NASAL specimens only), is one component of a comprehensive MRSA colonization surveillance program. It is not intended to diagnose MRSA infection nor to guide or monitor treatment for MRSA infections. Performed at [facility], [place] [address], [place], [place] [address]   Culture, respiratory     Status: None   Collection Time: 12/07/19 10:13 AM   Specimen: Bronchial Alveolar Lavage; Respiratory  Result Value Ref Range Status   Specimen Description Bronch [place]  Final   Special Requests NONE  Final   Gram Stain   Final    RARE WBC PRESENT,BOTH PMN AND MONONUCLEAR NO ORGANISMS SEEN    Culture   Final    Consistent with normal respiratory flora. Performed at [facility], [place] [address] [place],  [address]    Report Status 12/09/2019 FINAL  Final  Culture, blood (Routine X 2) w Reflex to ID Panel     Status: None (Preliminary result)   Collection Time: 12/11/19 11:16 AM   Specimen: BLOOD  Result Value Ref Range Status   Specimen Description BLOOD RIGHT ANTECUBITAL  Final   Special Requests   Final  BOTTLES DRAWN AEROBIC AND ANAEROBIC Blood Culture adequate volume   Culture   Final    NO GROWTH 3 DAYS Performed at [facility], [place] [address] [place], [place] [address]    Report Status PENDING  Incomplete  Culture, blood (Routine X 2) w Reflex to ID Panel     Status: None (Preliminary result)   Collection Time: 12/11/19 11:23 AM   Specimen: BLOOD RIGHT HAND  Result Value Ref Range Status   Specimen Description BLOOD RIGHT HAND  Final   Special Requests   Final    BOTTLES DRAWN AEROBIC AND ANAEROBIC Blood Culture  adequate volume   Culture   Final    NO GROWTH 3 DAYS Performed at [facility], [place] [address], [place], [place] [address]    Report Status PENDING  Incomplete         Radiology Studies: No results found.      Scheduled Meds: . amLODipine  10 mg Oral Daily  . carvedilol  25 mg Oral BID WC  . chlorhexidine gluconate (MEDLINE KIT)  15 mL Mouth Rinse BID  . Chlorhexidine Gluconate Cloth  6 each Topical Daily  . cloNIDine  0.3 mg Oral BID  . feeding supplement (ENSURE ENLIVE)  237 mL Oral TID BM  . heparin injection (subcutaneous)  5,000 Units Subcutaneous Q8H  . hydrALAZINE  100 mg Oral Q8H  . levETIRAcetam  500 mg Oral BID  . lisinopril  10 mg Oral BID  . mouth rinse  15 mL Mouth Rinse BID  . melatonin  3 mg Oral QHS  . pantoprazole  40 mg Oral Daily  . potassium chloride  40 mEq Oral Once  . senna  1 tablet Oral BID  . sodium chloride flush  10-40 mL Intracatheter Q12H   Continuous Infusions: . sodium chloride 50 mL/hr at 12/14/19 0738     LOS: 8 days    Time spent:35 Minutes    Dana Allan, MD  [facility] Pager #: [phone] 7PM-7AM contact night coverage as above

## 2019-12-14 NOTE — Progress Notes (Signed)
STROKE TEAM PROGRESS NOTE   INTERVAL HISTORY Wife at the bedside. Pt has been working with PT/OT this am, and now was exhausted and in sleep. PT/OT recommend CIR. Per wife, pt ate well with breakfast.   Vitals:   12/14/19 0452 12/14/19 0715 12/14/19 0815 12/14/19 1228  BP: (!) 160/106 (!) 166/93 (!) 162/96 (!) 143/101  Pulse: 87  (!) 116 98  Resp: (!) _0 Temp: 98.5 F (36.9 C) 98.7 F (37.1 C) 98.6 F (37 C) 99.5 F (37.5 C)  TempSrc: Oral   Axillary  SpO2: 98% 100% 100% 100%  Weight:      Height:       CBC:  Recent Labs  Lab 12/08/19 0518 12/09/19 0444 12/10/19 0423 12/12/19 0500 12/13/19 0550 12/14/19 0649  WBC 12.8*   < > 11.4*   < > 9.7 10.0  NEUTROABS 10.4*  --  9.0*  --   --   --   HGB 12.4*   < > 13.2   < > 13.3 13.1  HCT 41.8   < > 43.7   < > 41.9 41.2  MCV 91.7   < > 89.2   < > 85.5 84.6  PLT 265   < > 275   < > 294 297   < > = values in this interval not displayed.   Basic Metabolic Panel:  Recent Labs  Lab 12/10/19 0423 12/10/19 1020 12/13/19 0550 12/14/19 0649  NA 155*   < > 148* 141  K 3.7   < > 2.9* 3.5  CL 119*   < > 112* 111  CO2 26   < > 24 17*  GLUCOSE 94   < > 105* 166*  BUN 22*   < > 26* 22*  CREATININE 1.51*   < > 1.45* 1.38*  CALCIUM 8.7*   < > 8.8* 8.5*  MG 2.1  --   --  2.2  PHOS 3.1  --   --  2.6   < > = values in this interval not displayed.   Lipid Panel:     Component Value Date/Time   CHOL 232 (H) 12/06/2019 1203   TRIG 294 (H) 12/13/2019 0550   HDL 42 12/06/2019 1203   CHOLHDL 5.5 12/06/2019 1203   VLDL UNABLE TO CALCULATE IF TRIGLYCERIDE OVER 400 mg/dL 12/06/2019 1203   LDLCALC UNABLE TO CALCULATE IF TRIGLYCERIDE OVER 400 mg/dL 12/06/2019 1203   HgbA1c:  Lab Results  Component Value Date   HGBA1C 5.6 12/06/2019   Urine Drug Screen:     Component Value Date/Time   LABOPIA NONE DETECTED 12/11/2019 1103   COCAINSCRNUR NONE DETECTED 12/11/2019 1103   LABBENZ POSITIVE (A) 12/11/2019 1103   AMPHETMU NONE  DETECTED 12/11/2019 1103   THCU NONE DETECTED 12/11/2019 1103   LABBARB NONE DETECTED 12/11/2019 1103    Alcohol Level     Component Value Date/Time   ETH <10 12/06/2019 1203     IMAGING past 24 hours No results found.   PHYSICAL EXAM   Temp:  [98.3 F (36.8 C)-99.5 F (37.5 C)] 99.5 F (37.5 C) (06/29 1228) Pulse Rate:  [84-116] 98 (06/29 1228) Resp:  [20-30] 20 (06/29 1228) BP: (138-187)/(80-130) 143/101 (06/29 1228) SpO2:  [95 %-100 %] 100 % (06/29 1228)  General - Well nourished, well developed, not in distress, in sleep.  Ophthalmologic - fundi not visualized due to noncooperation.  Cardiovascular - Regular rhythm and rate.  Neuro - in sleep but arousable, orientated  x 4. No aphasia, following commands, able to repeat and name. Right gaze preference but able to cross midline, left hemianopia. Left facial droop. Tongue midline. Left UE 0/5 and LLE 1/5 proximal, 2-/5 knee extension, 0/5 distal. Right UE and LE at least 4/5. DTR 1+ and no babinski. Sensation subjectively symmetrical. Right FTN intact. Increased muscle tone LUE and LLE. Gait not tested.    ASSESSMENT/PLAN Dennis Mercado is a 39 y.o. male with recent HTN at dentist office with plans for PCP visit next week, presenting post coitus with L sided weakness, elevated BP.   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 IVH. Local mass effect w/o midline shift  CTA head Unremarkable   CT head at 12h stable  CT head 6/22 0440 stable R thalamic hemorrhage, IVH. Sllight increased in ventricle size. 4-5 midline shift  CT head 6/22 remains stable     MRI 6/23 - Unchanged size of right thalamic ICH with mild surrounding edema. No hydrocephalus or ventricular entrapment.   CT Head 6/26 - No substantial change  EEG - severe diffuse encephalopathy  Carotid Doppler - unremarkable  2D Echo EF 60-65%. No source of embolus    LDL 157  HgbA1c 5.6   Heparin subq for  VTE prophylaxis  No antithrombotic prior to admission, now on No antithrombotic given hemorrhage   Therapy recommendations: CIR   Disposition:  pending   Cerebral Edema   On 3% protocol -> now off   Now on NS  NA 151-153-151-150->155->158->154->148->141  Allow Na gradually trending down  Repeat CT 6/26 stable   MRI unchanged size with mild edema  Hypoxic Respiratory Failure Aspiration PNA Fever  leukocytosis  Emergently intubated 6/22 -> extubated 6/25  CT chest L consolidation   Bronchoscopy, BAL to LLL  Tmax - 101.8->100.2 ->101.9->101.7->100->afebrile  WBC - 20.6->12.8->15.7->11.4->11.8->9.7->10.0  Unasyn 6/22>>    CXR - 6/23 - Stable large left lung opacity concerning for pneumonia. Improved right basilar opacity is noted  CXR 6/26 Significant improvement compared to the prior study with near resolution of the previously seen hazy left lung airspace opacity  CCM onboard   Hypertensive Emergency   BP as high as 219/162   SBP goal < 160  BP still at high end  Now off cleviprex . Now on Coreg 25 mg Bid ; Norvasc 10 mg QD ; Hydralazine 100 mg Q8 hrs, clonidine 0.2->0.3 bid, lisinopril 10->bid . Long-term BP goal normotensive   Seizure   Agitation following extubation 6/25  L trapezius muscle contractions, extending to R trapezius, with subsequent Upper and lower extremity rigidity with rhythmic contraction. Pt unresponsive, upward deviated gaze. C/w seizure   S/p ativan  On depakote 500 Q6h -> 750 Q8h -> off  Now on keppra  EEG severe diffuse encephalopathy  Depakote level - 39     Hyperlipidemia  Home meds: none   LDL 157  Hold statin in setting of acute hemorrhage   Consider statin at d/c or f/u appt  Dysphagia . Secondary to stroke . Speech on board . Pt removed cortrak -> TF stopped  . Now on dys2 and thin liquid . Ensure Enlive tid  AKI vs. CKD  Creatinine 1.4-1.57-1.74->1.52->1.45->1.38  Continue IVF  BMP  monitoring  Other Stroke Risk Factors  ETOH use, advised to drink no more than 2 drink(s) a day  Substance abuse - UDS:  THC Positive. Patient will be advised to stop using due to stroke  risk.  Other Active Problems  Hypokalemia 3.2-2.9-4.1->3.7 ->3.0->3.6->2.9->3.5 - supplement  Melatonin added for sleep  Hospital day # 8   Rosalin Hawking, MD PhD Stroke Neurology 12/14/2019 2:45 PM    To contact Stroke Continuity provider, please refer to [email or web address] After hours, contact General Neurology

## 2019-12-14 NOTE — Progress Notes (Signed)
Occupational Therapy Treatment Patient Details Name: Dennis Mercado MRN: [id number] DOB: [birth year] Today's Date: 12/14/2019    History of present illness 39 y.o. male with a PMHx of HTN who presents via EMS as a Code Stroke after acute onset of left sided weakness and headache within minutes of finishing coitus with his wife. CT head reveals an acute hemorrhage centered in the right thalamus, extending into the ventricles, with local mass effect without midline shift. Pt required intubation 6/22 due to ARF secondary to aspiration PNA. PMH non contributory    OT comments  Pt seen in conjunction with PT to maximize participation and activity tolerance. Pt continues to present with L sided weakness, impaired balance and poor proprioception impacting pts ability to complete BADLs. Overall, pt requires MIN A - MOD A for sitting balance EOB, utilized mirror to provide visual feedback as pt with poor proprioception. Pt able to self correct balance impairments but unable to maintain correction. MOD A +2 to stand to stedy with pt needing assist to manage LUE. Pt able to complete weight shift R<>L with support at L knee. Pt very motivated and remains a great CIR candidate, will follow.   Follow Up Recommendations  CIR    Equipment Recommendations  None recommended by OT    Recommendations for Other Services      Precautions / Restrictions Precautions Precautions: Fall Precaution Comments: Lt hemiparesis (decreased sensation deficits on L UE and LE) Restrictions Weight Bearing Restrictions: No       Mobility Bed Mobility Overal bed mobility: Needs Assistance Bed Mobility: Rolling;Sidelying to Sit Rolling: Mod assist Sidelying to sit: Mod assist;+2 for physical assistance;+2 for safety/equipment       General bed mobility comments: cues for sequencing and hand over hand assist to reach to L rail; assist to bring bilat LE from EOB and to elevate trunk into sitting   Transfers Overall  transfer level: Needs assistance Equipment used: Ambulation equipment used Transfers: Sit to/from Stand Sit to Stand: Mod assist;+2 physical assistance;+2 safety/equipment         General transfer comment: assist to power up into standing with L UE supported by therapist    Balance Overall balance assessment: Needs assistance Sitting-balance support: Feet supported;Single extremity supported Sitting balance-Leahy Scale: Poor Sitting balance - Comments: use of mirror for visual input as pt has poor proprioception; max directional cues to achieve and maintain midline, modA initially transitioning to minA for support Postural control: Left lateral lean;Posterior lean Standing balance support: Single extremity supported Standing balance-Leahy Scale: Poor Standing balance comment: worked on bilateral weight shifting and midline posture in standing with therapist maintaining L knee extension and supporting L UE                           ADL either performed or assessed with clinical judgement   ADL Overall ADL's : Needs assistance/impaired   Eating/Feeding Details (indicate cue type and reason): R handed                     Toilet Transfer: Moderate assistance;+2 for physical assistance;+2 for safety/equipment (stand to stedy) Toilet Transfer Details (indicate cue type and reason): simulated via sit<>stand to stedy; MOD A +2 to power up. utilized mirror for visual feedback to self correct balance         Functional mobility during ADLs: Moderate assistance;+2 for physical assistance;+2 for safety/equipment (sit<>stand to stedy) General ADL Comments: pt presents with balance impairments,  L hemiparesis, and decreased awareness impacting pts ability to complete BADLs     Vision Baseline Vision/History: No visual deficits Patient Visual Report: Blurring of vision;Other (comment) (pt reports vision initially blurry when first getting up but improves as session  progressed) Vision Assessment?: Yes Eye Alignment: Within Functional Limits Ocular Range of Motion: Within Functional Limits Alignment/Gaze Preference: Within Defined Limits Tracking/Visual Pursuits: Able to track stimulus in all quads without difficulty Additional Comments: appears to [facility], reports blurry vision at start of session that improves as session continues; will continue to assess.   Perception     Praxis      Cognition Arousal/Alertness: Awake/alert Behavior During Therapy: Flat affect;WFL for tasks assessed/performed Overall Cognitive Status: Impaired/Different from baseline Area of Impairment: Problem solving;Awareness;Safety/judgement                         Safety/Judgement: Decreased awareness of safety;Decreased awareness of deficits Awareness: Emergent (able to state balance impairments and initiate correcting but can't maintain corrections) Problem Solving: Decreased initiation;Difficulty sequencing;Requires verbal cues;Requires tactile cues General Comments: follows single step commands consistently with increased time; decreased awareness of safety/deficits and asking often "want me to get up now?"        Exercises Other Exercises Other Exercises: self ROM to LUE with pt able to range arm 100 * with no c/o pain; LUE flaccid with decreased sensation from wrist up Other Exercises: enocuraged pt and family to complete PROM to L shoulder, elbow, wrist, digits   Shoulder Instructions       General Comments decreased sensation deficits on L UE and LE; pts gf/ fiance?  April present at end of session    Pertinent Vitals/ Pain       Pain Assessment: No/denies pain  Home Living                                          Prior Functioning/Environment              Frequency  Min 2X/week        Progress Toward Goals  OT Goals(current goals can now be found in the care plan section)  Progress towards OT goals: Progressing  toward goals  Acute Rehab OT Goals Patient Stated Goal: pt unable.  Spouse is hopeful he will return to baseline  OT Goal Formulation: With family Time For Goal Achievement: 12/24/19 Potential to Achieve Goals: Good  Plan Discharge plan remains appropriate;Frequency remains appropriate    Co-evaluation    PT/OT/SLP Co-Evaluation/Treatment: Yes Reason for Co-Treatment: For patient/therapist safety;To address functional/ADL transfers;Necessary to address cognition/behavior during functional activity;Complexity of the patient's impairments (multi-system involvement) PT goals addressed during session: Mobility/safety with mobility;Balance OT goals addressed during session: ADL's and self-care      AM-PAC OT "6 Clicks" Daily Activity     Outcome Measure   Help from another person eating meals?: A Little Help from another person taking care of personal grooming?: A Little Help from another person toileting, which includes using toliet, bedpan, or urinal?: A Lot Help from another person bathing (including washing, rinsing, drying)?: A Lot Help from another person to put on and taking off regular upper body clothing?: A Lot Help from another person to put on and taking off regular lower body clothing?: Total 6 Click Score: 13    End of Session Equipment Utilized During Treatment: Gait belt;Other (  comment) (stedy)  OT Visit Diagnosis: Cognitive communication deficit (R41.841);Hemiplegia and hemiparesis Symptoms and signs involving cognitive functions: Nontraumatic intracerebral hemorrhage Hemiplegia - Right/Left: Left Hemiplegia - dominant/non-dominant: Non-Dominant Hemiplegia - caused by: Nontraumatic intracerebral hemorrhage   Activity Tolerance Patient tolerated treatment well   Patient Left in chair;with call bell/phone within reach;with chair alarm set;with family/visitor present;Other (comment) (lift pad behind pt if needed)   Nurse Communication Mobility status;Need for lift  equipment;Other (comment) (stedy back to bed)        Time: 0447-1580 OT Time Calculation (min): 40 min  Charges: OT General Charges $OT Visit: 1 Visit OT Treatments $Self Care/Home Management : 8-22 mins  Lanier Clam., COTA/L Acute Rehabilitation Services [phone]    Ihor Gully 12/14/2019, 10:46 AM

## 2019-12-14 NOTE — PMR Pre-admission (Addendum)
PMR Admission Coordinator Pre-Admission Assessment  Patient: Dennis Mercado is an 39 y.o., male MRN: [id number] DOB: 1980/08/09 Height: 5' 7"  (170.2 cm) Weight: 74 kg              Insurance Information HMO:     PPO: yes     PCP:      IPA:      80/20:      OTHER:  PRIMARY: [employer] [place]      Policy#: [id number]      Subscriber: patient CM Name: Larene Beach      Phone#: [phone]     Fax#: [phone] Pre-Cert#: [id number]      Employer:  Josem Kaufmann provided by Larene Beach for admit to CIR with effective dates 12/15/19 and clinical update due 12/28/19 to [place] (P); [phone] (f) [phone] Benefits:  Phone #: [phone]     Name:  Eff. Date: 05/18/2019-still active     Deduct: $5,000 ($4,623.28 met)      Out of Pocket Max: $6,600 (includes deductible - [phone] met)      Life Max: NA  CIR: $250 co-pay /admission; 70% co-insurance, 30% co-insurance      SNF: 70% coverage, 30% co-insurance; limited to 60 days/cal yr Outpatient: 50% coverage; 50% co-insurance; limited to 30 combined PT/OT visits and 30 ST visits     Home Health: 70% coverage, 30% co-insurance; limited by medical necessity review      DME: 100% coverage; 0% co-insurance     Providers:  SECONDARY: None      Policy#:       Phone#:   Development worker, community:       Phone#:   The Therapist, art Information Summary" for patients in Inpatient Rehabilitation Facilities with attached "Privacy Act [place] Records" was provided and verbally reviewed with: N/A  Emergency Contact Information Contact Information    Name Relation Home Work Mobile   Orebaugh,April Spouse   [phone]   Richarda Osmond Mother   [phone]   Rickardo, Brinegar [phone]       Current Medical History  Patient Admitting Diagnosis: R thalamic intracranial hemorrhage  History of Present Illness: Dennis Mercado is a 39 year old right-handed male with documented history of hypertension on no current antihypertensive medications.  History taken  from chart review and wife due to cognition.  Patient lives with spouse.  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  1 level home one-step to entry.  He presented on 12/06/2019 with left hemiparesis and AMS during sexual activity.  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  Per report, measuring 3.6 x 2.8 x 2.6 cm.  Hemorrhage extends into the ventricles and could be seen in the third and fourth ventricles without hydrocephalus.  Local mass-effect without midline shift.  CT angiogram of head and neck normal variant CTA circle of Willis without significant proximal stenosis aneurysm or branch vessel occlusion.  Admission chemistries potassium 3.2, creatinine 1.52, urine drug screen positive marijuana.  Echocardiogram with ejection fraction of 65%, no wall motion abnormalities.  EEG negative for seizures.  Carotid Dopplers unremarkable.  Venous Dopplers upper extremity negative for DVT.  Follow-up CT, personally reviewed, stable hemorrhage.  Unchanged size of right thalamic intraparenchymal hematoma with mild surrounding edema.  Hospital course further complicated by fevers presumed to be secondary to large left lung opacity concerning for pneumonia.  He completed course of Unasyn.  Dysphagia #2 thin liquids.  Cleviprex ongoing for blood pressure control since discontinued.  He was cleared to begin subcutaneous heparin  for DVT prophylaxis 12/11/2019.  Therapy evaluations completed and patient is to be admitted for a comprehensive rehab program on 12/15/19.   Complete NIHSS TOTAL: 10 Glasgow Coma Scale Score: 15  Past Medical History  No past medical history on file.  Family History  family history is not on file.  Prior Rehab/Hospitalizations:  Has the patient had prior rehab or hospitalizations prior to admission? No  Has the patient had major surgery during 100 days prior to admission? No  Current Medications   Current Facility-Administered Medications:  .   0.9 %  sodium chloride infusion, , Intravenous, PRN, Rosalin Hawking, MD, Paused at 12/14/19 1628 .  acetaminophen (TYLENOL) tablet 650 mg, 650 mg, Oral, Q4H PRN, 650 mg at 12/15/19 0809 **OR** acetaminophen (TYLENOL) 160 MG/5ML solution 650 mg, 650 mg, Per Tube, Q4H PRN, 650 mg at 12/10/19 1214 **OR** [DISCONTINUED] acetaminophen (TYLENOL) suppository 650 mg, 650 mg, Rectal, Q4H PRN, Kerney Elbe, MD, 650 mg at 12/11/19 0841 .  amLODipine (NORVASC) tablet 10 mg, 10 mg, Oral, Daily, Rosalin Hawking, MD, 10 mg at 12/15/19 1013 .  atorvastatin (LIPITOR) tablet 40 mg, 40 mg, Oral, Daily, Rosalin Hawking, MD .  carvedilol (COREG) tablet 25 mg, 25 mg, Oral, BID WC, Rosalin Hawking, MD, 25 mg at 12/15/19 0809 .  chlorhexidine gluconate (MEDLINE KIT) (PERIDEX) 0.12 % solution 15 mL, 15 mL, Mouth Rinse, BID, Minor, Grace Bushy, NP, 15 mL at 12/15/19 0810 .  Chlorhexidine Gluconate Cloth 2 % PADS 6 each, 6 each, Topical, Daily, Garvin Fila, MD, 6 each at 12/15/19 1015 .  cloNIDine (CATAPRES) tablet 0.3 mg, 0.3 mg, Oral, BID, Rosalin Hawking, MD, 0.3 mg at 12/15/19 1017 .  feeding supplement (ENSURE ENLIVE) (ENSURE ENLIVE) liquid 237 mL, 237 mL, Oral, TID BM, Rosalin Hawking, MD, 237 mL at 12/15/19 1015 .  haloperidol (HALDOL) tablet 2 mg, 2 mg, Oral, Q6H PRN, Icard, Bradley L, DO, 2 mg at 12/14/19 0145 .  heparin injection 5,000 Units, 5,000 Units, Subcutaneous, Q8H, Rosalin Hawking, MD, 5,000 Units at 12/15/19 0623 .  hydrALAZINE (APRESOLINE) injection 5-20 mg, 5-20 mg, Intravenous, Q4H PRN, Rosalin Hawking, MD, 20 mg at 12/14/19 1834 .  hydrALAZINE (APRESOLINE) tablet 100 mg, 100 mg, Oral, Q8H, Candee Furbish, MD, 100 mg at 12/15/19 0809 .  hydrocortisone (ANUSOL-HC) 2.5 % rectal cream 1 application, 1 application, Rectal, TID PRN, Merlene Laughter F, NP .  labetalol (NORMODYNE) injection 5-20 mg, 5-20 mg, Intravenous, Q2H PRN, Rosalin Hawking, MD, 20 mg at 12/14/19 2132 .  levETIRAcetam (KEPPRA) tablet 500 mg, 500 mg, Oral, BID, Rosalin Hawking, MD, 500 mg at 12/15/19 1011 .  lisinopril (ZESTRIL) tablet 10 mg, 10 mg, Oral, BID, Rosalin Hawking, MD, 10 mg at 12/15/19 1013 .  LORazepam (ATIVAN) injection 2 mg, 2 mg, Intravenous, Q5 min PRN, Bowser, Grace E, NP, 2 mg at 12/10/19 1519 .  MEDLINE mouth rinse, 15 mL, Mouth Rinse, BID, Rosalin Hawking, MD, 15 mL at 12/15/19 1015 .  melatonin tablet 3 mg, 3 mg, Oral, QHS, Raulkar, Clide Deutscher, MD, 3 mg at 12/14/19 2317 .  ondansetron (ZOFRAN-ODT) disintegrating tablet 4 mg, 4 mg, Oral, Q6H PRN, Icard, Bradley L, DO .  pantoprazole (PROTONIX) EC tablet 40 mg, 40 mg, Oral, Daily, Rosalin Hawking, MD, 40 mg at 12/15/19 1016 .  senna (SENOKOT) tablet 8.6 mg, 1 tablet, Oral, BID, Rosalin Hawking, MD, 8.6 mg at 12/15/19 1013 .  sodium chloride flush (NS) 0.9 % injection 10-40 mL, 10-40 mL, Intracatheter, Q12H, Minor,  Grace Bushy, NP, 10 mL at 12/15/19 1016 .  sodium chloride flush (NS) 0.9 % injection 10-40 mL, 10-40 mL, Intracatheter, PRN, Minor, Grace Bushy, NP  Patients Current Diet:  Diet Order            DIET DYS 2 Room service appropriate? Yes with Assist; Fluid consistency: Thin  Diet effective now                 Precautions / Restrictions Precautions Precautions: Fall Precaution Comments: Lt hemiparesis (decreased sensation deficits on L UE and LE) Restrictions Weight Bearing Restrictions: No   Has the patient had 2 or more falls or a fall with injury in the past year?No  Prior Activity Level Community (5-7x/wk): very active PTA, worked full time at [employer]; drove, recently married in May. Independent with No AD PTA  Prior Functional Level Prior Function Level of Independence: Independent Comments: Pt was fully independent.  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[employer]: Did the patient need help bathing, dressing, using the toilet or eating?  Independent  Indoor Mobility: Did the patient need assistance with walking from room to room (with or without device)? Independent  Stairs:  Did the patient need assistance with internal or external stairs (with or without device)? Independent  Functional Cognition: Did the patient need help planning regular tasks such as shopping or remembering to take medications? Independent  Home Assistive Devices / Equipment Home Equipment: None  Prior Device Use: Indicate devices/aids used by the patient prior to current illness, exacerbation or injury? None of the above  Current Functional Level Cognition  Arousal/Alertness: Awake/alert Overall Cognitive Status: Impaired/Different from baseline Difficult to assess due to:  (ETT, lethargy) Orientation Level: Oriented X4 Safety/Judgement: Decreased awareness of safety, Decreased awareness of deficits General Comments: follows single step commands consistently with increased time; decreased awareness of safety/deficits and asking often "want me to get up now?" Attention: Sustained Sustained Attention: Impaired Sustained Attention Impairment: Verbal basic Memory: Impaired Memory Impairment: Decreased recall of new information Awareness: Impaired Awareness Impairment: Intellectual impairment Problem Solving: Appears intact Problem Solving Impairment: Verbal basic    Extremity Assessment (includes Sensation/Coordination)  Upper Extremity Assessment: LUE deficits/detail, RUE deficits/detail RUE Deficits / Details: Pt with withdrawal to pain, and minimal movement of Rt UE noted in response to stimulation, but family reports he has been actively moving Rt UE  LUE Deficits / Details: No spontaneous movement noted. PROM elbow distally WFL, and shoulder to ~90* - limited by ETT  LUE Coordination: decreased fine motor, decreased gross motor  Lower Extremity Assessment: Defer to PT evaluation RLE Deficits / Details: PROM WFL, pt with rigidity present initially but becomes more fluid with progressive PROM. No AROM noted in BLE LLE Deficits / Details: PROM WFL, pt with rigidity present initially  but becomes more fluid with progressive PROM. No AROM noted in BLE    ADLs  Overall ADL's : Needs assistance/impaired Eating/Feeding: NPO Eating/Feeding Details (indicate cue type and reason): R handed Grooming: Total assistance, Bed level Upper Body Bathing: Total assistance, Bed level Lower Body Bathing: Total assistance, Bed level Upper Body Dressing : Total assistance, Bed level Lower Body Dressing: Total assistance, Bed level Toilet Transfer: Moderate assistance, +2 for physical assistance, +2 for safety/equipment (stand to stedy) Toilet Transfer Details (indicate cue type and reason): simulated via sit<>stand to stedy; MOD A +2 to power up. utilized Geologist, engineering for visual feedback to self correct balance Toileting- Clothing Manipulation and Hygiene: Total assistance, Bed level Functional  mobility during ADLs: Moderate assistance, +2 for physical assistance, +2 for safety/equipment (sit<>stand to stedy) General ADL Comments: pt presents with balance impairments, L hemiparesis, and decreased awareness impacting pts ability to complete BADLs    Mobility  Overal bed mobility: Needs Assistance Bed Mobility: Rolling, Sidelying to Sit Rolling: Mod assist Sidelying to sit: Mod assist, +2 for physical assistance, +2 for safety/equipment Supine to sit: Max assist, +2 for physical assistance General bed mobility comments: cues for sequencing and hand over hand assist to reach to L rail; assist to bring bilat LE from EOB and to elevate trunk into sitting     Transfers  Overall transfer level: Needs assistance Equipment used: Ambulation equipment used Transfer via Lift Equipment: Stedy Transfers: Sit to/from Stand Sit to Stand: Mod assist, +2 physical assistance, +2 safety/equipment Stand pivot transfers: Mod assist, Max assist, +2 physical assistance General transfer comment: assist to power up into standing with L UE supported by therapist    Ambulation / Gait / Stairs / Wheelchair Mobility   Ambulation/Gait General Gait Details: unable at this time    Posture / Balance Dynamic Sitting Balance Sitting balance - Comments: use of mirror for visual input as pt has poor proprioception; max directional cues to achieve and maintain midline, modA initially transitioning to minA for support Balance Overall balance assessment: Needs assistance Sitting-balance support: Feet supported, Single extremity supported Sitting balance-Leahy Scale: Poor Sitting balance - Comments: use of mirror for visual input as pt has poor proprioception; max directional cues to achieve and maintain midline, modA initially transitioning to minA for support Postural control: Left lateral lean, Posterior lean Standing balance support: Single extremity supported Standing balance-Leahy Scale: Poor Standing balance comment: worked on bilateral weight shifting and midline posture in standing with therapist maintaining L knee extension and supporting L UE    Special needs/care consideration Designated visitor wife: April     Previous Home Environment (from acute therapy documentation) Living Arrangements: Spouse/significant other  Lives With: Spouse Available Help at Discharge: Family, Available 24 hours/day, Friend(s) Type of Home: House Home Layout: One level Home Access: Stairs to enter Entrance Stairs-Rails: None Entrance Stairs-Number of Steps: 1 Bathroom Shower/Tub: Chiropodist: Standard Additional Comments: Mother and sister are both CNAs - sister works at [employer] on ?29M 7a-7p  Discharge Living Setting Plans for Discharge Living Setting: House, Lives with (comment) (wife) Type of Home at Discharge: House Discharge Home Layout: One level Discharge Home Access: Stairs to enter Entrance Stairs-Rails: None Entrance Stairs-Number of Steps: 1 step + threshold Discharge Bathroom Shower/Tub: Tub/shower unit Discharge Bathroom Toilet: Handicapped height Discharge Bathroom Accessibility:  Yes How Accessible: Accessible via walker (if sideways) Does the patient have any problems obtaining your medications?: No  Social/Family/Support Systems Patient Roles: Spouse Contact Information: wife: April ([phone]) Anticipated Caregiver: wife April + other family members (like his mom Santiago Glad) Anticipated Caregiver's Contact Information: see above Ability/Limitations of Caregiver: Min/Mod A  Caregiver Availability: 24/7 Discharge Plan Discussed with Primary Caregiver: Yes (pt and his wife) Is Caregiver In Agreement with Plan?: Yes Does Caregiver/Family have Issues with Lodging/Transportation while Pt is in Rehab?: No   Goals Patient/Family Goal for Rehab: PT/OT: Min/Mod A; SLP: supervision Expected length of stay: 18-22 days Pt/Family Agrees to Admission and willing to participate: Yes Program Orientation Provided & Reviewed with Pt/Caregiver Including Roles  & Responsibilities: Yes (pt and his wife)  Barriers to Discharge: Home environment access/layout  Barriers to Discharge Comments: steps to enter home   Decrease burden of  Care through IP rehab admission: NA   Possible need for SNF placement upon discharge:Not anticipated; pt has great family support and given his age, PLOF, and motivation feel he is an excellent candidate for CIR. Anticipate pt can reach anticipated goals through CIR and can DC straight home with support in place.    Patient Condition: This patient's medical and functional status has changed since the consult dated: 12/13/19 in which the Rehabilitation Physician determined and documented that the patient's condition is appropriate for intensive rehabilitative care in an inpatient rehabilitation facility. Medical changes are: see H&P for details.  Functional changes are: improvement in functional bed mobility from total A to Mod A +2, improvement in transfers from unbale to attempt to Mod A +2  And improvement in ADLs from total A bed level to Mod A +2. After  evaluating the patient today and speaking with the Rehabilitation physician and acute team, the patient remains appropriate for inpatient rehab. Will admit to inpatient rehab today.  Preadmission Screen Completed By:  Raechel Ache, OT, 12/15/2019 1:59 PM ______________________________________________________________________   Discussed status with Dr. Posey Pronto on 12/15/19 at 1:59PM and received approval for admission today.  Admission Coordinator:  Raechel Ache, time 1:59PM/Date 12/15/19.

## 2019-12-14 NOTE — Progress Notes (Signed)
Inpatient Rehabilitation-Admissions Coordinator   Inpatient Rehab Admissions:    I met with pt and his wife at the bedside as follow up from PM&R MD consult. Please see consult completed by Dr. Ranell Patrick for details. Discussed recommended rehab program, anticipated LOS, and expected assist needed at DC. Confirmed DC support with pt's wife. Pt and his wife would like to proceed with this program. [facility] will begin insurance authorization process for possible admit. Please call if questions.   Signed: Raechel Ache, OTR/L  Rehab Admissions Coordinator  [phone] 12/14/2019 3:06 PM

## 2019-12-14 NOTE — TOC CAGE-AID Note (Signed)
Transition of Care [facility]) - CAGE-AID Screening   Patient Details  Name: Dennis Mercado MRN: [id number] Date of Birth: [birth year]  Transition of [facility] CM/SW Contact:    Emeterio Reeve, [place] Phone Number: 12/14/2019, 11:41 AM   Clinical Narrative:  Pt is unable to participate in assessment due to not being oriented.   CAGE-AID Screening: Substance Abuse Screening unable to be completed due to: : Patient unable to participate                  [place] Clinical Social Worker [phone]

## 2019-12-14 NOTE — Progress Notes (Signed)
Physical Therapy Treatment Patient Details Name: Dennis Mercado MRN: [id number] DOB: [birth year] Today's Date: 12/14/2019    History of Present Illness 39 y.o. male with a PMHx of HTN who presents via EMS as a Code Stroke after acute onset of left sided weakness and headache within minutes of finishing coitus with his wife. CT head reveals an acute hemorrhage centered in the right thalamus, extending into the ventricles, with local mass effect without midline shift. Pt required intubation 6/22 due to ARF secondary to aspiration PNA. PMH non contributory     PT Comments    Patient is making progress toward PT goals and tolerated session well. Pt continues to present with L hemiplegia, L UE/LE decreased sensation, and decreased awareness of safety/deficits. Pt requires mod A +2 for all mobility. Stedy standing frame utilized for safety with functional transfer training and sitting/standing balance activities.  Wife present and supportive. Continue to recommend CIR for further skilled PT services to maximize independence and safety with mobility.    Follow Up Recommendations  CIR     Equipment Recommendations  Wheelchair (measurements PT);Wheelchair cushion (measurements PT);Hospital bed    Recommendations for Other Services       Precautions / Restrictions Precautions Precautions: Fall Precaution Comments: Lt hemiparesis (decreased sensation deficits on L UE and LE) Restrictions Weight Bearing Restrictions: No    Mobility  Bed Mobility Overal bed mobility: Needs Assistance Bed Mobility: Rolling;Sidelying to Sit Rolling: Mod assist Sidelying to sit: Mod assist;+2 for physical assistance;+2 for safety/equipment       General bed mobility comments: cues for sequencing and hand over hand assist to reach to L rail; assist to bring bilat LE from EOB and to elevate trunk into sitting   Transfers Overall transfer level: Needs assistance   Transfers: Sit to/from Stand Sit to Stand:  Mod assist;+2 physical assistance;+2 safety/equipment         General transfer comment: assist to power up into standing with L UE supported by therapist  Ambulation/Gait                 Stairs             Wheelchair Mobility    Modified Rankin (Stroke Patients Only) Modified Rankin (Stroke Patients Only) Pre-Morbid Rankin Score: No symptoms Modified Rankin: Severe disability     Balance Overall balance assessment: Needs assistance Sitting-balance support: Feet supported;Single extremity supported Sitting balance-Leahy Scale: Poor Sitting balance - Comments: use of mirror for visual input as pt has poor proprioception; max directional cues to achieve and maintain midline, modA initially transitioning to minA for support Postural control: Left lateral lean;Posterior lean Standing balance support: Single extremity supported Standing balance-Leahy Scale: Poor Standing balance comment: worked on bilateral weight shifting and midline posture in standing with therapist maintaining L knee extension and supporting L UE                            Cognition Arousal/Alertness: Awake/alert Behavior During Therapy: Flat affect;WFL for tasks assessed/performed Overall Cognitive Status: Impaired/Different from baseline Area of Impairment: Problem solving;Awareness;Safety/judgement                         Safety/Judgement: Decreased awareness of safety;Decreased awareness of deficits Awareness: Emergent Problem Solving: Decreased initiation;Difficulty sequencing;Requires verbal cues;Requires tactile cues General Comments: follows single step commands consistently with increased time; decreased awareness of safety/deficits and asking often "want me to get up now?"  Exercises      General Comments General comments (skin integrity, edema, etc.): BP in supine 143/106 (118)      Pertinent Vitals/Pain Pain Assessment: No/denies pain    Home  Living                      Prior Function            PT Goals (current goals can now be found in the care plan section) Progress towards PT goals: Progressing toward goals    Frequency    Min 4X/week      PT Plan Current plan remains appropriate    Co-evaluation PT/OT/SLP Co-Evaluation/Treatment: Yes Reason for Co-Treatment: For patient/therapist safety;To address functional/ADL transfers;Necessary to address cognition/behavior during functional activity PT goals addressed during session: Mobility/safety with mobility;Balance        AM-PAC PT "6 Clicks" Mobility   Outcome Measure  Help needed turning from your back to your side while in a flat bed without using bedrails?: A Lot Help needed moving from lying on your back to sitting on the side of a flat bed without using bedrails?: Total Help needed moving to and from a bed to a chair (including a wheelchair)?: Total Help needed standing up from a chair using your arms (e.g., wheelchair or bedside chair)?: A Lot Help needed to walk in hospital room?: Total Help needed climbing 3-5 steps with a railing? : Total 6 Click Score: 8    End of Session Equipment Utilized During Treatment: Gait belt Activity Tolerance: Patient tolerated treatment well Patient left: in chair;with call bell/phone within reach;with chair alarm set;with family/visitor present Nurse Communication: Mobility status PT Visit Diagnosis: Other abnormalities of gait and mobility (R26.89);Muscle weakness (generalized) (M62.81);Other symptoms and signs involving the nervous system (R29.898);Hemiplegia and hemiparesis Hemiplegia - Right/Left: Left Hemiplegia - caused by: Nontraumatic intracerebral hemorrhage     Time: 2248-2500 PT Time Calculation (min) (ACUTE ONLY): 41 min  Charges:  $Gait Training: 8-22 mins $Neuromuscular Re-education: 8-22 mins                     Earney Navy, PTA Acute Rehabilitation Services Pager: [phone] Office: [phone]     Darliss Cheney 12/14/2019, 10:17 AM

## 2019-12-15 ENCOUNTER — Encounter (HOSPITAL_COMMUNITY): Payer: Self-pay | Admitting: Physical Medicine & Rehabilitation

## 2019-12-15 ENCOUNTER — Other Ambulatory Visit: Payer: Self-pay

## 2019-12-15 ENCOUNTER — Inpatient Hospital Stay (HOSPITAL_COMMUNITY)
Admission: AD | Admit: 2019-12-15 | Discharge: 2020-01-12 | DRG: 057 | Disposition: A | Discharge status: Home / Self Care | Payer: BC Managed Care – PPO | Source: Intra-hospital | Attending: Physical Medicine & Rehabilitation | Admitting: Physical Medicine & Rehabilitation

## 2019-12-15 DIAGNOSIS — I69114 Frontal lobe and executive function deficit following nontraumatic intracerebral hemorrhage: Secondary | ICD-10-CM

## 2019-12-15 DIAGNOSIS — I69154 Hemiplegia and hemiparesis following nontraumatic intracerebral hemorrhage affecting left non-dominant side: Secondary | ICD-10-CM | POA: Diagnosis not present

## 2019-12-15 DIAGNOSIS — G441 Vascular headache, not elsewhere classified: Secondary | ICD-10-CM | POA: Diagnosis not present

## 2019-12-15 DIAGNOSIS — Z79899 Other long term (current) drug therapy: Secondary | ICD-10-CM

## 2019-12-15 DIAGNOSIS — I69112 Visuospatial deficit and spatial neglect following nontraumatic intracerebral hemorrhage: Secondary | ICD-10-CM

## 2019-12-15 DIAGNOSIS — R7401 Elevation of levels of liver transaminase levels: Secondary | ICD-10-CM

## 2019-12-15 DIAGNOSIS — E871 Hypo-osmolality and hyponatremia: Secondary | ICD-10-CM

## 2019-12-15 DIAGNOSIS — I10 Essential (primary) hypertension: Secondary | ICD-10-CM

## 2019-12-15 DIAGNOSIS — G4700 Insomnia, unspecified: Secondary | ICD-10-CM | POA: Diagnosis present

## 2019-12-15 DIAGNOSIS — Z4659 Encounter for fitting and adjustment of other gastrointestinal appliance and device: Secondary | ICD-10-CM

## 2019-12-15 DIAGNOSIS — R1310 Dysphagia, unspecified: Secondary | ICD-10-CM | POA: Diagnosis not present

## 2019-12-15 DIAGNOSIS — I952 Hypotension due to drugs: Secondary | ICD-10-CM

## 2019-12-15 DIAGNOSIS — D72829 Elevated white blood cell count, unspecified: Secondary | ICD-10-CM | POA: Diagnosis not present

## 2019-12-15 DIAGNOSIS — Z8249 Family history of ischemic heart disease and other diseases of the circulatory system: Secondary | ICD-10-CM | POA: Diagnosis not present

## 2019-12-15 DIAGNOSIS — G8194 Hemiplegia, unspecified affecting left nondominant side: Secondary | ICD-10-CM

## 2019-12-15 DIAGNOSIS — M7989 Other specified soft tissue disorders: Secondary | ICD-10-CM | POA: Diagnosis not present

## 2019-12-15 DIAGNOSIS — I618 Other nontraumatic intracerebral hemorrhage: Secondary | ICD-10-CM | POA: Diagnosis not present

## 2019-12-15 DIAGNOSIS — G479 Sleep disorder, unspecified: Secondary | ICD-10-CM | POA: Diagnosis not present

## 2019-12-15 DIAGNOSIS — M542 Cervicalgia: Secondary | ICD-10-CM | POA: Diagnosis present

## 2019-12-15 DIAGNOSIS — I69191 Dysphagia following nontraumatic intracerebral hemorrhage: Secondary | ICD-10-CM

## 2019-12-15 DIAGNOSIS — R7989 Other specified abnormal findings of blood chemistry: Secondary | ICD-10-CM

## 2019-12-15 DIAGNOSIS — I69391 Dysphagia following cerebral infarction: Secondary | ICD-10-CM | POA: Diagnosis not present

## 2019-12-15 DIAGNOSIS — D649 Anemia, unspecified: Secondary | ICD-10-CM

## 2019-12-15 DIAGNOSIS — R509 Fever, unspecified: Secondary | ICD-10-CM

## 2019-12-15 DIAGNOSIS — Z0189 Encounter for other specified special examinations: Secondary | ICD-10-CM

## 2019-12-15 DIAGNOSIS — I619 Nontraumatic intracerebral hemorrhage, unspecified: Secondary | ICD-10-CM | POA: Diagnosis present

## 2019-12-15 DIAGNOSIS — I610 Nontraumatic intracerebral hemorrhage in hemisphere, subcortical: Secondary | ICD-10-CM | POA: Diagnosis not present

## 2019-12-15 DIAGNOSIS — F1210 Cannabis abuse, uncomplicated: Secondary | ICD-10-CM

## 2019-12-15 DIAGNOSIS — E785 Hyperlipidemia, unspecified: Secondary | ICD-10-CM | POA: Diagnosis present

## 2019-12-15 DIAGNOSIS — D62 Acute posthemorrhagic anemia: Secondary | ICD-10-CM | POA: Diagnosis not present

## 2019-12-15 LAB — CBC
HCT: 42 % (ref 39.0–52.0)
Hemoglobin: 13.4 g/dL (ref 13.0–17.0)
MCH: 27 pg (ref 26.0–34.0)
MCHC: 31.9 g/dL (ref 30.0–36.0)
MCV: 84.5 fL (ref 80.0–100.0)
Platelets: 327 10*3/uL (ref 150–400)
RBC: 4.97 MIL/uL (ref 4.22–5.81)
RDW: 14.7 % (ref 11.5–15.5)
WBC: 11.6 10*3/uL — ABNORMAL HIGH (ref 4.0–10.5)
nRBC: 0 % (ref 0.0–0.2)

## 2019-12-15 LAB — BASIC METABOLIC PANEL
Anion gap: 9 (ref 5–15)
BUN: 19 mg/dL (ref 6–20)
CO2: 21 mmol/L — ABNORMAL LOW (ref 22–32)
Calcium: 8.8 mg/dL — ABNORMAL LOW (ref 8.9–10.3)
Chloride: 110 mmol/L (ref 98–111)
Creatinine, Ser: 1.37 mg/dL — ABNORMAL HIGH (ref 0.61–1.24)
GFR calc Af Amer: >60 mL/min (ref >60)
GFR calc non Af Amer: >60 mL/min (ref >60)
Glucose, Bld: 126 mg/dL — ABNORMAL HIGH (ref 70–99)
Potassium: 3.6 mmol/L (ref 3.5–5.1)
Sodium: 140 mmol/L (ref 135–145)

## 2019-12-15 LAB — MAGNESIUM: Magnesium: 2.2 mg/dL (ref 1.7–2.4)

## 2019-12-15 MED ORDER — HYDROCORTISONE (PERIANAL) 2.5 % EX CREA: 1.0000 "application " | TOPICAL_CREAM | Freq: Three times a day (TID) | CUTANEOUS | 0 refills | Status: DC | PRN

## 2019-12-15 MED ORDER — SENNA 8.6 MG PO TABS
1.0000 | ORAL_TABLET | Freq: Two times a day (BID) | ORAL | Status: DC
  Administered 2019-12-15 – 2020-01-12 (×52): 8.6 mg via ORAL
  Filled 2019-12-15 (×56): qty 1

## 2019-12-15 MED ORDER — CARVEDILOL 25 MG PO TABS: 25.0000 mg | ORAL_TABLET | Freq: Two times a day (BID) | ORAL | Status: DC

## 2019-12-15 MED ORDER — ONDANSETRON 4 MG PO TBDP
4.0000 mg | ORAL_TABLET | Freq: Four times a day (QID) | ORAL | Status: AC | PRN
  Administered 2019-12-15: 4 mg via ORAL
  Filled 2019-12-15: qty 1

## 2019-12-15 MED ORDER — LEVETIRACETAM 500 MG PO TABS
500.0000 mg | ORAL_TABLET | Freq: Two times a day (BID) | ORAL | Status: DC
  Administered 2019-12-15 – 2020-01-12 (×56): 500 mg via ORAL
  Filled 2019-12-15 (×56): qty 1

## 2019-12-15 MED ORDER — SENNA 8.6 MG PO TABS: 1.0000 | ORAL_TABLET | Freq: Two times a day (BID) | ORAL | 0 refills | Status: DC

## 2019-12-15 MED ORDER — ACETAMINOPHEN 160 MG/5ML PO SOLN
650.0000 mg | ORAL | Status: DC | PRN
  Administered 2019-12-16 – 2020-01-10 (×22): 650 mg
  Filled 2019-12-15 (×23): qty 20.3

## 2019-12-15 MED ORDER — ATORVASTATIN CALCIUM 40 MG PO TABS: 40.0000 mg | ORAL_TABLET | Freq: Every day | ORAL | Status: DC

## 2019-12-15 MED ORDER — LISINOPRIL 10 MG PO TABS
10.0000 mg | ORAL_TABLET | Freq: Two times a day (BID) | ORAL | Status: DC
  Administered 2019-12-15 – 2019-12-26 (×20): 10 mg via ORAL
  Filled 2019-12-15 (×22): qty 1

## 2019-12-15 MED ORDER — HYDRALAZINE HCL 100 MG PO TABS: 100.0000 mg | ORAL_TABLET | Freq: Three times a day (TID) | ORAL | Status: DC

## 2019-12-15 MED ORDER — CLONIDINE HCL 0.1 MG PO TABS
0.3000 mg | ORAL_TABLET | Freq: Two times a day (BID) | ORAL | Status: DC
  Administered 2019-12-15 – 2020-01-02 (×35): 0.3 mg via ORAL
  Filled 2019-12-15 (×40): qty 3

## 2019-12-15 MED ORDER — MELATONIN 3 MG PO TABS
3.0000 mg | ORAL_TABLET | Freq: Every day | ORAL | Status: DC
  Administered 2019-12-15 – 2020-01-11 (×28): 3 mg via ORAL
  Filled 2019-12-15 (×28): qty 1

## 2019-12-15 MED ORDER — CLONIDINE HCL 0.3 MG PO TABS: 0.3000 mg | ORAL_TABLET | Freq: Two times a day (BID) | ORAL | 11 refills | Status: DC

## 2019-12-15 MED ORDER — HYDROCORTISONE (PERIANAL) 2.5 % EX CREA
1.0000 "application " | TOPICAL_CREAM | Freq: Three times a day (TID) | CUTANEOUS | Status: DC | PRN
  Filled 2019-12-15: qty 28.35

## 2019-12-15 MED ORDER — AMLODIPINE BESYLATE 10 MG PO TABS: 10.0000 mg | ORAL_TABLET | Freq: Every day | ORAL | Status: DC

## 2019-12-15 MED ORDER — ENSURE ENLIVE PO LIQD: 237.0000 mL | Freq: Three times a day (TID) | ORAL | 12 refills | Status: DC

## 2019-12-15 MED ORDER — PANTOPRAZOLE SODIUM 40 MG PO TBEC
40.0000 mg | DELAYED_RELEASE_TABLET | Freq: Every day | ORAL | Status: DC
  Administered 2019-12-16 – 2020-01-12 (×28): 40 mg via ORAL
  Filled 2019-12-15 (×28): qty 1

## 2019-12-15 MED ORDER — HEPARIN SODIUM (PORCINE) 5000 UNIT/ML IJ SOLN
5000.0000 [IU] | Freq: Three times a day (TID) | INTRAMUSCULAR | Status: DC
  Administered 2019-12-15 – 2019-12-21 (×17): 5000 [IU] via SUBCUTANEOUS
  Filled 2019-12-15 (×17): qty 1

## 2019-12-15 MED ORDER — AMLODIPINE BESYLATE 10 MG PO TABS
10.0000 mg | ORAL_TABLET | Freq: Every day | ORAL | Status: DC
  Administered 2019-12-16 – 2020-01-12 (×27): 10 mg via ORAL
  Filled 2019-12-15 (×28): qty 1

## 2019-12-15 MED ORDER — ENSURE ENLIVE PO LIQD
237.0000 mL | Freq: Three times a day (TID) | ORAL | Status: DC
  Administered 2019-12-15 – 2020-01-07 (×57): 237 mL via ORAL

## 2019-12-15 MED ORDER — HEPARIN SODIUM (PORCINE) 5000 UNIT/ML IJ SOLN: 5000.0000 [IU] | Freq: Three times a day (TID) | INTRAMUSCULAR | Status: DC

## 2019-12-15 MED ORDER — CARVEDILOL 25 MG PO TABS
25.0000 mg | ORAL_TABLET | Freq: Two times a day (BID) | ORAL | Status: DC
  Administered 2019-12-15 – 2020-01-12 (×55): 25 mg via ORAL
  Filled 2019-12-15 (×56): qty 1

## 2019-12-15 MED ORDER — LEVETIRACETAM 500 MG PO TABS: 500.0000 mg | ORAL_TABLET | Freq: Two times a day (BID) | ORAL | Status: DC

## 2019-12-15 MED ORDER — PANTOPRAZOLE SODIUM 40 MG PO TBEC: 40.0000 mg | DELAYED_RELEASE_TABLET | Freq: Every day | ORAL | Status: DC

## 2019-12-15 MED ORDER — HYDRALAZINE HCL 50 MG PO TABS
100.0000 mg | ORAL_TABLET | Freq: Three times a day (TID) | ORAL | Status: DC
  Administered 2019-12-15 – 2019-12-23 (×23): 100 mg via ORAL
  Filled 2019-12-15 (×24): qty 2

## 2019-12-15 MED ORDER — ACETAMINOPHEN 325 MG PO TABS
650.0000 mg | ORAL_TABLET | ORAL | Status: DC | PRN
  Administered 2019-12-15 – 2020-01-12 (×56): 650 mg via ORAL
  Filled 2019-12-15 (×60): qty 2

## 2019-12-15 MED ORDER — SORBITOL 70 % SOLN
30.0000 mL | Freq: Every day | Status: DC | PRN
  Administered 2019-12-19: 30 mL via ORAL
  Filled 2019-12-15 (×2): qty 30

## 2019-12-15 MED ORDER — LISINOPRIL 10 MG PO TABS: 10.0000 mg | ORAL_TABLET | Freq: Two times a day (BID) | ORAL | Status: DC

## 2019-12-15 MED ORDER — ATORVASTATIN CALCIUM 40 MG PO TABS
40.0000 mg | ORAL_TABLET | Freq: Every day | ORAL | Status: DC
  Administered 2019-12-15 – 2020-01-11 (×28): 40 mg via ORAL
  Filled 2019-12-15 (×28): qty 1

## 2019-12-15 NOTE — Plan of Care (Signed)
  Problem: RH KNOWLEDGE DEFICIT Goal: RH STG INCREASE KNOWLEDGE OF HYPERTENSION Description: Pt/family will be able to demonstrate knowledge of HTN management with medications and diet precautions and ways to monitor blood pressure with min assist using handouts/booklets provided by staff Outcome: Progressing Note:  Pt/family will be able to demonstrate knowledge of HTN management with medications and diet precautions and ways to monitor blood pressure with min assist using handouts/booklets provided by staff   Problem: RH SKIN INTEGRITY Goal: RH STG SKIN FREE OF INFECTION/BREAKDOWN Description: Pt will be free of skin breakdown/infection with min assist during CIR stay Outcome: Progressing    Problem: RH SAFETY Goal: RH STG ADHERE TO SAFETY PRECAUTIONS W/ASSISTANCE/DEVICE Description: STG Adhere to Safety Precautions With cues/reminders Assistance/Device. 12/15/2019 1950 by Vivia Birmingham Glenna Fellows, RN Outcome: Progressing 12/15/2019 1944 by Valton Schwartz, Glenna Fellows, RN Outcome: Progressing

## 2019-12-15 NOTE — Progress Notes (Signed)
Izora Ribas, MD  Physician  Physical Medicine and Rehabilitation  Consult Note     Signed  Date of Service:  12/13/2019  6:31 AM      Related encounter: ED to Hosp-Admission (Discharged) from 12/06/2019 in [facility]      Signed      Expand All Collapse All  Show:Clear all [x] Manual[x] Template[] Copied  Added by: [x] Angiulli, Lavon Paganini, PA-C[x] Raulkar, Clide Deutscher, MD  [] Hover for details          Physical Medicine and Rehabilitation Consult Reason for Consult: Left side weakness Referring Physician: Dr.Xu     HPI: Dennis Mercado is a 39 y.o. right-handed male with documented history of hypertension on no current antihypertensive medications.  Per chart review patient lives with spouse.  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  1 level home one-step to entry.  Presented 12/06/2019 with acute onset of left side weakness and altered mental status.  Cranial CT scan showed acute hemorrhage centered in the right thalamus measuring 3.6 x 2.8 x 2.6 cm.  Hemorrhage extends into the ventricles and can be seen in the third and fourth ventricles without hydrocephalus.  Local mass-effect without midline shift.  CT angiogram of head and neck normal variant CTA circle of Willis without significant proximal stenosis aneurysm or branch vessel occlusion.  Admission chemistries potassium 3.2, creatinine 1.52, urine drug screen positive marijuana.  Echo with ejection fraction of 65% no wall motion abnormalities.  EEG negative for seizure.  Carotid Dopplers unremarkable.  Venous Dopplers upper extremity negative for DVT.  MRI follow-up unchanged size of right thalamic intraparenchymal hematoma with mild surrounding edema.  Patient maintained on 3% protocol.  Hospital course fever complicated by large left lung opacity concerning for pneumonia currently maintained on Unasyn.  Dysphagia #2 thin liquid diet.  Cleviprex ongoing for blood pressure control.   He was cleared to begin subcutaneous heparin for DVT prophylaxis 12/11/2019.  Therapy evaluation completed with recommendations of physical medicine rehab consult.     Review of Systems  Constitutional: Positive for fever. Negative for chills.  HENT: Negative for hearing loss.   Eyes: Negative for blurred vision and double vision.  Respiratory: Negative for cough and shortness of breath.   Cardiovascular: Negative for chest pain, palpitations and leg swelling.  Gastrointestinal: Positive for constipation. Negative for heartburn, nausea and vomiting.  Genitourinary: Negative for dysuria and flank pain.  Musculoskeletal: Positive for myalgias.  Skin: Negative for rash.  Neurological: Positive for weakness and headaches.  All other systems reviewed and are negative.   No past medical history on file. No past surgical history on file. No family history on file. Social History:  reports that he has never smoked. He has never used smokeless tobacco. He reports current alcohol use. He reports that he does not use drugs. Allergies: No Known Allergies No medications prior to admission.      Home: Home Living Family/patient expects to be discharged to:: Private residence Living Arrangements: Spouse/significant other Available Help at Discharge: Family, Available 24 hours/day, Friend(s) Type of Home: House Home Access: Stairs to enter [employer] of Steps: 1 Entrance Stairs-Rails: None Home Layout: One level Bathroom Shower/Tub: Chiropodist: Perry: None Additional Comments: Mother and sister are both CNAs - sister works at [employer] on ?75M 7a-7p  Lives With: Spouse  Functional History: Prior Function Level of Independence: Independent Comments: Pt was fully independent.  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[employer]  Functional Status:  Mobility: Bed Mobility Overal bed mobility: Needs Assistance Bed Mobility: Rolling Rolling: Total assist General  bed mobility comments: Pt unable to assist  Transfers General transfer comment: unable to attempt    ADL: ADL Overall ADL's : Needs assistance/impaired Eating/Feeding: NPO Grooming: Total assistance, Bed level Upper Body Bathing: Total assistance, Bed level Lower Body Bathing: Total assistance, Bed level Upper Body Dressing : Total assistance, Bed level Lower Body Dressing: Total assistance, Bed level Toilet Transfer: Total assistance Toilet Transfer Details (indicate cue type and reason): unable  Toileting- Clothing Manipulation and Hygiene: Total assistance, Bed level Functional mobility during ADLs: Total assistance General ADL Comments: Pt unable to engage in any ADL activities    Cognition: Cognition Overall Cognitive Status: Impaired/Different from baseline Arousal/Alertness: Awake/alert Orientation Level: Oriented X4 Attention: Sustained Sustained Attention: Impaired Sustained Attention Impairment: Verbal basic Memory: Impaired Memory Impairment: Decreased recall of new information Awareness: Impaired Awareness Impairment: Intellectual impairment Problem Solving: Appears intact Problem Solving Impairment: Verbal basic Cognition Arousal/Alertness: Lethargic Behavior During Therapy: Flat affect Overall Cognitive Status: Impaired/Different from baseline General Comments: Pt with eyes closed throughout session.  He demonstrated only minimal withdrawal of Rt UE to painful stimuli.  Does not follow command or respond to auditory or visual stimulation  Difficult to assess due to:  (ETT, lethargy)   Blood pressure (!) 154/107, pulse 96, temperature 98.7 F (37.1 C), temperature source Oral, resp. rate 17, height 5\' 7"  (1.702 m), weight 74 kg, SpO2 93 %.   General: Alert and oriented x 3, No apparent distress HEENT: Head is normocephalic, atraumatic, sclera anicteric, oral mucosa pink and moist, dentition intact, ext ear canals clear,  Neck: Supple without JVD or  lymphadenopathy Heart: Reg rate and rhythm. No murmurs rubs or gallops Chest: CTA bilaterally without wheezes, rales, or rhonchi; no distress Abdomen: Soft, non-tender, non-distended, bowel sounds positive. Extremities: No clubbing, cyanosis, or edema. Pulses are 2+ Skin: Clean and intact without signs of breakdown Neuro: Patient is alert and somewhat anxious.  Makes eye contact with examiner. Right gaze preference. Speech is a bit dysarthric.  He is oriented to self with some word salad.  Follows some simple commands but inconsistent. 5/5 strength on right, 0/5 on left. Sensation intact. Increased tone on left side.  Psych: Pt's affect is depressed, fatigued. Pt is cooperative     Lab Results Last 24 Hours       Results for orders placed or performed during the hospital encounter of 12/06/19 (from the past 24 hour(s))  Glucose, capillary     Status: Abnormal    Collection Time: 12/12/19  7:24 AM  Result Value Ref Range    Glucose-Capillary 131 (H) 70 - 99 mg/dL  Sodium     Status: Abnormal    Collection Time: 12/12/19  9:20 AM  Result Value Ref Range    Sodium 155 (H) 135 - 145 mmol/L  Glucose, capillary     Status: Abnormal    Collection Time: 12/12/19 12:42 PM  Result Value Ref Range    Glucose-Capillary 132 (H) 70 - 99 mg/dL  Glucose, capillary     Status: Abnormal    Collection Time: 12/12/19  3:26 PM  Result Value Ref Range    Glucose-Capillary 106 (H) 70 - 99 mg/dL  Glucose, capillary     Status: None    Collection Time: 12/12/19  7:36 PM  Result Value Ref Range    Glucose-Capillary 96 70 - 99 mg/dL  Glucose, capillary  Status: Abnormal    Collection Time: 12/12/19 11:16 PM  Result Value Ref Range    Glucose-Capillary 119 (H) 70 - 99 mg/dL  Glucose, capillary     Status: Abnormal    Collection Time: 12/13/19  3:28 AM  Result Value Ref Range    Glucose-Capillary 143 (H) 70 - 99 mg/dL       Imaging Results (Last 48 hours)  CT HEAD WO CONTRAST   Result Date:  12/11/2019 CLINICAL DATA:  Intracranial hemorrhage, follow-up EXAM: CT HEAD WITHOUT CONTRAST TECHNIQUE: Contiguous axial images were obtained from the base of the skull through the vertex without intravenous contrast. COMPARISON:  12/07/2019 FINDINGS: Brain: Hemorrhage centered within the right thalamus is not substantially changed in size with similar surrounding edema. Intraventricular extension is again noted with some interval redistribution. Similar regional mass effect including effacement of the third ventricle. Lateral ventricles are stable in size. No new hemorrhage.  No loss of gray-white differentiation. Vascular: No new finding. Skull: Calvarium is unremarkable. Sinuses/Orbits: No acute finding. Other: None. IMPRESSION: No substantial change in right thalamic hemorrhage with intraventricular extension since 12/07/2019. No new hemorrhage or worsening mass effect. Stable caliber of ventricles. Electronically Signed   By: Macy Mis M.D.   On: 12/11/2019 17:57    DG CHEST PORT 1 VIEW   Result Date: 12/11/2019 CLINICAL DATA:  Fever for 2 days. EXAM: PORTABLE CHEST 1 VIEW COMPARISON:  12/08/2019 FINDINGS: Endotracheal and nasogastric tubes have been removed. Left internal jugular central venous line is stable. Hazy lung opacity noted on the left on the prior study has mostly resolved. No new lung abnormalities. No pleural effusion or pneumothorax. IMPRESSION: 1. Significant improvement compared to the prior study with near resolution of the previously seen hazy left lung airspace opacity. No new abnormalities. 2. Status post extubation and removal of the nasogastric tube. Electronically Signed   By: Lajean Manes M.D.   On: 12/11/2019 11:52    DG Swallowing Func-Speech Pathology   Result Date: 12/12/2019 Objective Swallowing Evaluation: Type of Study: MBS-Modified Barium Swallow Study  Patient Details Name: Dennis Mercado MRN: [id number] Date of Birth: 12-Jan-1981 Today's Date: 12/12/2019 Time: SLP  Start Time (ACUTE ONLY): 1130 -SLP Stop Time (ACUTE ONLY): 1200 SLP Time Calculation (min) (ACUTE ONLY): 30 min Past Medical History: No past medical history on file. Past Surgical History: No past surgical history on file. HPI:  Fremon Zacharia is an 39 y.o. male with a PMHx of HTN who presents via EMS as a Code Stroke after acute onset of left sided weakness and headache.  His wife noted that his left side was weak and that he was starting to look altered. Left facial weakness was also noted. His wife then called EMS. On arrival EMS noted the same deficits. Vitals per EMS: BP 207/148, HR 92, 97% on RA, CBG 122, afebrile. LKN was 0315.  Initial CT of the head was showing Acute hemorrhage centered in the right thalamus measures 3.6 x 2.8 x 2.6 cm. Estimated volume is 13.1 mL.  Hemorrhage extends into the ventricles and can be seen in the  Subjective: The patient was seen in radiology sitting upright in a chair.  Assessment / Plan / Recommendation CHL IP CLINICAL IMPRESSIONS 12/12/2019 Clinical Impression MBS was completed using thin liquids via spoon, self fed cup and straw sips, pureed material and dual textured solids.  He presented with a mild oral dysphagia.  Transient penetration was seen during the swallow given thin liquids via spoon, cup and straw.  Recommend a dysphagia 2 diet with thin liquids.  Suggest full supervision for intake due to his impulsivity to pace intake.  ST will follow during acute stay and he would benefit from continued follow up at the next level of care.  Please see below for information regarding swallow physiology.  ORAL PHASE -Fair lingual control particularly given thin liquids.  This led to poor bolus control and allowed material to fall the level of the pyriform sinuses prior to the swallow trigger. -Mildly reduced lingual movement which led to a delay in bolus transport particularly seen during dual textured bolus trials. -Oral residue across texture trials.  He was generally  sensate to this residue and able to recollect and clear oral cavity when there was a significant amount of residue.  PHARYNGEAL PHASE -Functional pharyngeal swallow.  No pharyngeal residue was seen post swallow. -Timely swallow trigger. -Transient penetration of thin liquids during the swallow.  This material was noted to completely clear the laryngeal vestibule. ESOPHAGEAL PHASE -Sweep revealed it slow to clear.   SLP Visit Diagnosis Dysphagia, oral phase (R13.11) Attention and concentration deficit following -- Frontal lobe and executive function deficit following -- Impact on safety and function Mild aspiration risk   CHL IP TREATMENT RECOMMENDATION 12/12/2019 Treatment Recommendations Therapy as outlined in treatment plan below   Prognosis 12/12/2019 Prognosis for Safe Diet Advancement Good Barriers to Reach Goals Cognitive deficits Barriers/Prognosis Comment -- CHL IP DIET RECOMMENDATION 12/12/2019 SLP Diet Recommendations Dysphagia 2 (Fine chop) solids;Thin liquid Liquid Administration via Cup;Straw Medication Administration Whole meds with liquid Compensations Slow rate;Small sips/bites Postural Changes Remain semi-upright after after feeds/meals (Comment);Seated upright at 90 degrees   CHL IP OTHER RECOMMENDATIONS 12/12/2019 Recommended Consults -- Oral Care Recommendations Oral care BID Other Recommendations Have oral suction available   CHL IP FOLLOW UP RECOMMENDATIONS 12/12/2019 Follow up Recommendations Inpatient Rehab   CHL IP FREQUENCY AND DURATION 12/12/2019 Speech Therapy Frequency (ACUTE ONLY) min 2x/week Treatment Duration 2 weeks      CHL IP ORAL PHASE 12/12/2019 Oral Phase Impaired Oral - Pudding Teaspoon -- Oral - Pudding Cup -- Oral - Honey Teaspoon -- Oral - Honey Cup -- Oral - Nectar Teaspoon -- Oral - Nectar Cup -- Oral - Nectar Straw -- Oral - Thin Teaspoon Decreased bolus cohesion;Premature spillage;Lingual/palatal residue Oral - Thin Cup Decreased bolus cohesion;Premature  spillage;Lingual/palatal residue Oral - Thin Straw Premature spillage;Decreased bolus cohesion;Lingual/palatal residue Oral - Puree Decreased bolus cohesion;Lingual/palatal residue;Delayed oral transit Oral - Mech Soft -- Oral - Regular -- Oral - Multi-Consistency Lingual/palatal residue;Decreased bolus cohesion;Delayed oral transit Oral - Pill -- Oral Phase - Comment --  CHL IP PHARYNGEAL PHASE 12/12/2019 Pharyngeal Phase WFL Pharyngeal- Pudding Teaspoon -- Pharyngeal -- Pharyngeal- Pudding Cup -- Pharyngeal -- Pharyngeal- Honey Teaspoon -- Pharyngeal -- Pharyngeal- Honey Cup -- Pharyngeal -- Pharyngeal- Nectar Teaspoon -- Pharyngeal -- Pharyngeal- Nectar Cup -- Pharyngeal -- Pharyngeal- Nectar Straw -- Pharyngeal -- Pharyngeal- Thin Teaspoon -- Pharyngeal -- Pharyngeal- Thin Cup -- Pharyngeal -- Pharyngeal- Thin Straw -- Pharyngeal -- Pharyngeal- Puree -- Pharyngeal -- Pharyngeal- Mechanical Soft -- Pharyngeal -- Pharyngeal- Regular -- Pharyngeal -- Pharyngeal- Multi-consistency -- Pharyngeal -- Pharyngeal- Pill -- Pharyngeal -- Pharyngeal Comment --  CHL IP CERVICAL ESOPHAGEAL PHASE 12/12/2019 Cervical Esophageal Phase WFL Pudding Teaspoon -- Pudding Cup -- Honey Teaspoon -- Honey Cup -- Nectar Teaspoon -- Nectar Cup -- Nectar Straw -- Thin Teaspoon -- Thin Cup -- Thin Straw -- Puree -- Mechanical Soft -- Regular -- Multi-consistency -- Pill -- Cervical  Esophageal Comment -- Shelly Flatten, MA, CCC-SLP Acute Rehab SLP [phone] Lamar Sprinkles 12/12/2019, 12:19 PM               VAS [place] UPPER EXTREMITY VENOUS DUPLEX   Result Date: 12/12/2019 UPPER VENOUS STUDY  Indications: Edema, and ICH Limitations: Line. Comparison Study: No prior study on file for comparison Performing Technologist: Sharion Dove RVS  Examination Guidelines: A complete evaluation includes B-mode imaging, spectral Doppler, color Doppler, and power Doppler as needed of all accessible portions of each vessel. Bilateral testing is considered an  integral part of a complete examination. Limited examinations for reoccurring indications may be performed as noted.  Right Findings: +----------+------------+---------+-----------+----------+-------+ RIGHT     CompressiblePhasicitySpontaneousPropertiesSummary +----------+------------+---------+-----------+----------+-------+ Subclavian               Yes       Yes                      +----------+------------+---------+-----------+----------+-------+  Left Findings: +----------+------------+---------+-----------+----------+---------------+ LEFT      CompressiblePhasicitySpontaneousProperties    Summary     +----------+------------+---------+-----------+----------+---------------+ IJV                      Yes       Yes                              +----------+------------+---------+-----------+----------+---------------+ Subclavian               Yes       Yes                              +----------+------------+---------+-----------+----------+---------------+ Axillary                 Yes       Yes                              +----------+------------+---------+-----------+----------+---------------+ Brachial      Full       Yes       Yes                              +----------+------------+---------+-----------+----------+---------------+ Radial        Full                                                  +----------+------------+---------+-----------+----------+---------------+ Ulnar                                               patent by Color +----------+------------+---------+-----------+----------+---------------+ Cephalic      Full                                                  +----------+------------+---------+-----------+----------+---------------+ Basilic       Full                                                  +----------+------------+---------+-----------+----------+---------------+  Summary:  Right: No evidence of  thrombosis in the subclavian.  Left: No evidence of deep vein thrombosis in the upper extremity. No evidence of superficial vein thrombosis in the upper extremity. Interstitial fluid noted throughout forearm.  *See table(s) above for measurements and observations.  Diagnosing physician: Deitra Mayo MD Electronically signed by Deitra Mayo MD on 12/12/2019 at 6:51:23 PM.    Final          Assessment/Plan: Diagnosis: R thalamic intracranial hemorrhagge 1. Does the need for close, 24 hr/day medical supervision in concert with the patient's rehab needs make it unreasonable for this patient to be served in a less intensive setting? Yes 2. Co-Morbidities requiring supervision/potential complications: HTN, right sided flaccidity, right gaze preference, cerebral edema, hypoxic respiratory failure, aspiration pneumonia, fever, leukocytosis 3. Due to bladder management, bowel management, safety, skin/wound care, disease management, medication administration, pain management and patient education, does the patient require 24 hr/day rehab nursing? Yes 4. Does the patient require coordinated care of a physician, rehab nurse, therapy disciplines of PT, OT, SLP to address physical and functional deficits in the context of the above medical diagnosis(es)? Yes Addressing deficits in the following areas: balance, endurance, locomotion, strength, transferring, bowel/bladder control, bathing, dressing, feeding, grooming, toileting, cognition and psychosocial support 5. Can the patient actively participate in an intensive therapy program of at least 3 hrs of therapy per day at least 5 days per week? Yes 6. The potential for patient to make measurable gains while on inpatient rehab is excellent 7. Anticipated functional outcomes upon discharge from inpatient rehab are modified independent  with PT, modified independent with OT, modified independent with SLP. 8. Estimated rehab length of stay to reach the above  functional goals is: 2-3 weeks 9. Anticipated discharge destination: Home 10. Overall Rehab/Functional Prognosis: excellent   RECOMMENDATIONS: This patient's condition is appropriate for continued rehabilitative care in the following setting: CIR Patient has agreed to participate in recommended program. Yes Note that insurance prior authorization may be required for reimbursement for recommended care.   Comment: Dennis Mercado would be an excellent CIR candidate. He will have support upon discharge. He is complaining of neck pain due to positioning and would benefit from a heating pad (I will place order). He has been sleeping poorly at night and would like Melatonin (I will place order).   Thank you for this consult. We will continue to follow in Dennis Mercado' care.   Lavon Paganini Angiulli, PA-C 12/13/2019    I have personally performed a face to face diagnostic evaluation, including, but not limited to relevant history and physical exam findings, of this patient and developed relevant assessment and plan.  Additionally, I have reviewed and concur with the physician assistant's documentation above.   Leeroy Cha, MD        Revision History                     Routing History                Note Details  Author Ranell Patrick, Clide Deutscher, MD File Time 12/13/2019  8:26 PM  Author Type Physician Status Signed  Last Editor Izora Ribas, MD Service Physical Medicine [facility] # [id number] Admit Date 12/15/2019

## 2019-12-15 NOTE — Progress Notes (Signed)
Occupational Therapy Treatment Patient Details Name: Dennis Mercado MRN: [id number] DOB: 21-Sep-1980 Today's Date: 12/15/2019    History of present illness 39 y.o. male with a PMHx of HTN who presents via EMS as a Code Stroke after acute onset of left sided weakness and headache within minutes of finishing coitus with his wife. CT head reveals an acute hemorrhage centered in the right thalamus, extending into the ventricles, with local mass effect without midline shift. Pt required intubation 6/22 due to ARF secondary to aspiration PNA. PMH non contributory    OT comments  Pt seen in conjunction with PT to maximize participation and activity tolerance. Pt making good progress towards OT goals this session able to transition EOB with MOD A +2 and increased time. Pt continues to present with impaired balance and L sided weakness impacting pts ability to complete BADLs. MOD A +2 to stand to stedy. Pt able to weight shift in standing R<>L with MAX A for balance on L side. Continue to highly recommend CIR as pt is very motivated and has good family support. Will follow acutely for OT needs.   Follow Up Recommendations  CIR    Equipment Recommendations  None recommended by OT    Recommendations for Other Services      Precautions / Restrictions Precautions Precautions: Fall Precaution Comments: L UE/LE flaccid; minimal sensation with noxious stimuli L toes and no sensation L UE  Restrictions Weight Bearing Restrictions: No       Mobility Bed Mobility Overal bed mobility: Needs Assistance Bed Mobility: Rolling;Sidelying to Sit Rolling: Min assist Sidelying to sit: Mod assist;+2 for physical assistance;+2 for safety/equipment       General bed mobility comments: multimodal cues for sequencing and assist to bring bilat LE/hips to EOB and to elevate trunk into sitting; use of rail rolling toward L side. attempted using RLE to maneuver LLE to EOB but pt unable to fully  elevate  Transfers Overall transfer level: Needs assistance Equipment used: Ambulation equipment used Transfers: Sit to/from Stand Sit to Stand: Mod assist;+2 physical assistance;+2 safety/equipment         General transfer comment: L UE supported by gait belt (fashioned as sling) and therapist assisted with L knee extension when powering up into standing and then to maintain; +2 assist to stand and to maintain balance once in standing    Balance Overall balance assessment: Needs assistance Sitting-balance support: Feet supported;Single extremity supported Sitting balance-Leahy Scale: Poor Sitting balance - Comments: worked on sitting balance EOB and on stedy standing frame with multimodal cues Postural control: Left lateral lean;Posterior lean Standing balance support: Single extremity supported Standing balance-Leahy Scale: Poor Standing balance comment: bilat weight shifting and correcting to midline posture in standing using stedy standing frame with multimodal cues and L knee supported in extension                            ADL either performed or assessed with clinical judgement   ADL Overall ADL's : Needs assistance/impaired                         Toilet Transfer: Moderate assistance;+2 for physical assistance;+2 for safety/equipment (stand to stedy) Toilet Transfer Details (indicate cue type and reason): simulated via sit<>stand to stedy; MOD A +2 to power up.         Functional mobility during ADLs: Moderate assistance;+2 for physical assistance;+2 for safety/equipment (sit<>stand to stedy) General  ADL Comments: improved sitting balance this session     Vision Baseline Vision/History: No visual deficits Patient Visual Report: Blurring of vision;Other (comment) (pt reports vision initially blurry when first getting up but improves as session progressed) Vision Assessment?: Vision impaired- to be further tested in functional context (L  inattention but improving in comparision to previous session)   Perception     Praxis      Cognition Arousal/Alertness: Awake/alert Behavior During Therapy: Flat affect (but responds appropriately) Overall Cognitive Status: Impaired/Different from baseline Area of Impairment: Problem solving;Awareness;Safety/judgement                         Safety/Judgement: Decreased awareness of safety;Decreased awareness of deficits Awareness: Emergent Problem Solving: Decreased initiation;Difficulty sequencing;Requires verbal cues;Requires tactile cues General Comments: pt recalled therapists' names from previous session day before        Exercises Other Exercises Other Exercises: wife reports completing PROM to LUE and LLE   Shoulder Instructions       General Comments continues to present with decreased sensation in LUE and LLE; wife April present during session    Pertinent Vitals/ Pain       Pain Assessment: Faces Faces Pain Scale: No hurt Pain Intervention(s): Monitored during session  Home Living                                          Prior Functioning/Environment              Frequency  Min 2X/week        Progress Toward Goals  OT Goals(current goals can now be found in the care plan section)  Progress towards OT goals: Progressing toward goals  Acute Rehab OT Goals Patient Stated Goal: pt unable.  Spouse is hopeful he will return to baseline  OT Goal Formulation: With family Time For Goal Achievement: 12/24/19 Potential to Achieve Goals: Good  Plan Discharge plan remains appropriate;Frequency remains appropriate    Co-evaluation    PT/OT/SLP Co-Evaluation/Treatment: Yes Reason for Co-Treatment: Complexity of the patient's impairments (multi-system involvement);For patient/therapist safety PT goals addressed during session: Mobility/safety with mobility;Balance OT goals addressed during session: ADL's and self-care       AM-PAC OT "6 Clicks" Daily Activity     Outcome Measure   Help from another person eating meals?: A Little Help from another person taking care of personal grooming?: A Little Help from another person toileting, which includes using toliet, bedpan, or urinal?: A Lot Help from another person bathing (including washing, rinsing, drying)?: A Lot Help from another person to put on and taking off regular upper body clothing?: A Lot Help from another person to put on and taking off regular lower body clothing?: A Lot 6 Click Score: 14    End of Session Equipment Utilized During Treatment: Gait belt;Other (comment) (stedy; gait belt as sling)  OT Visit Diagnosis: Cognitive communication deficit (R41.841);Hemiplegia and hemiparesis Symptoms and signs involving cognitive functions: Nontraumatic intracerebral hemorrhage Hemiplegia - Right/Left: Left Hemiplegia - dominant/non-dominant: Non-Dominant Hemiplegia - caused by: Nontraumatic intracerebral hemorrhage   Activity Tolerance Patient tolerated treatment well   Patient Left in chair;with call bell/phone within reach;with chair alarm set   Nurse Communication Mobility status        Time: 0737-1062 OT Time Calculation (min): 42 min  Charges: OT General Charges $OT Visit: 1 Visit OT Treatments $  Therapeutic Activity: 8-22 mins  Lanier Clam., COTA/L Acute Rehabilitation Services [phone] [place] 12/15/2019, 4:55 PM

## 2019-12-15 NOTE — H&P (Signed)
Physical Medicine and Rehabilitation Admission H&P    Chief Complaint  Patient presents with   Code Stroke  : HPI: Dennis Mercado is a 39 year old right-handed male with documented history of hypertension on no current antihypertensive medications.  History taken from chart review and wife due to cognition.  Patient lives with spouse.  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  1 level home one-step to entry.  He presented on 12/06/2019 with left hemiparesis and AMS during sexual activity.  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  Per report, measuring 3.6 x 2.8 x 2.6 cm.  Hemorrhage extends into the ventricles and could be seen in the third and fourth ventricles without hydrocephalus.  Local mass-effect without midline shift.  CT angiogram of head and neck normal variant CTA circle of Willis without significant proximal stenosis aneurysm or branch vessel occlusion.  Admission chemistries potassium 3.2, creatinine 1.52, urine drug screen positive marijuana.  Echocardiogram with ejection fraction of 65%, no wall motion abnormalities.  EEG negative for seizures.  Carotid Dopplers unremarkable.  Venous Dopplers upper extremity negative for DVT.  Follow-up CT, personally reviewed, stable hemorrhage.  Unchanged size of right thalamic intraparenchymal hematoma with mild surrounding edema.  Hospital course further complicated by fevers presumed to be secondary to large left lung opacity concerning for pneumonia.  He completed course of Unasyn.  Dysphagia #2 thin liquids.  Cleviprex ongoing for blood pressure control since discontinued.  He was cleared to begin subcutaneous heparin for DVT prophylaxis 12/11/2019.  Therapy evaluations completed and patient was admitted for a comprehensive rehab program.  Please see preadmission assessment from earlier today as well.  Review of Systems  Unable to perform ROS: Other  ?  Reliability due to cognition  No past medical  history on file.,  Unable to accurately obtain from patient No past surgical history on file.,  Unable to accurately obtain from patient No family history on file.,  Unable to accurately obtain from patient Social History:  reports that he has never smoked. He has never used smokeless tobacco. He reports current alcohol use. He reports that he does not use drugs. Allergies: No Known Allergies No medications prior to admission.   Drug Regimen Review Drug regimen was reviewed and remains appropriate with no significant issues identified  Home: Home Living Family/patient expects to be discharged to:: Private residence Living Arrangements: Spouse/significant other Available Help at Discharge: Family, Available 24 hours/day, Friend(s) Type of Home: House Home Access: Stairs to enter [employer] of Steps: 1 Entrance Stairs-Rails: None Home Layout: One level Bathroom Shower/Tub: Chiropodist: Standard Home Equipment: None Additional Comments: Mother and sister are both CNAs - sister works at [employer] on ?76M 7a-7p  Lives With: Spouse   Functional History: Prior Function Level of Independence: Independent Comments: Pt was fully independent.  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[employer]:  Mobility: Bed Mobility Overal bed mobility: Needs Assistance Bed Mobility: Rolling, Sidelying to Sit Rolling: Mod assist Sidelying to sit: Mod assist, +2 for physical assistance, +2 for safety/equipment Supine to sit: Max assist, +2 for physical assistance General bed mobility comments: cues for sequencing and hand over hand assist to reach to L rail; assist to bring bilat LE from EOB and to elevate trunk into sitting  Transfers Overall transfer level: Needs assistance Equipment used: Ambulation equipment used Transfer via Lift Equipment: Stedy Transfers: Sit to/from Stand Sit to Stand: Mod assist, +2 physical assistance, +2  safety/equipment Stand pivot  transfers: Mod assist, Max assist, +2 physical assistance General transfer comment: assist to power up into standing with L UE supported by therapist Ambulation/Gait General Gait Details: unable at this time    ADL: ADL Overall ADL's : Needs assistance/impaired Eating/Feeding: NPO Eating/Feeding Details (indicate cue type and reason): R handed Grooming: Total assistance, Bed level Upper Body Bathing: Total assistance, Bed level Lower Body Bathing: Total assistance, Bed level Upper Body Dressing : Total assistance, Bed level Lower Body Dressing: Total assistance, Bed level Toilet Transfer: Moderate assistance, +2 for physical assistance, +2 for safety/equipment (stand to stedy) Toilet Transfer Details (indicate cue type and reason): simulated via sit<>stand to stedy; MOD A +2 to power up. utilized Geologist, engineering for visual feedback to self correct balance Toileting- Clothing Manipulation and Hygiene: Total assistance, Bed level Functional mobility during ADLs: Moderate assistance, +2 for physical assistance, +2 for safety/equipment (sit<>stand to stedy) General ADL Comments: pt presents with balance impairments, L hemiparesis, and decreased awareness impacting pts ability to complete BADLs  Cognition: Cognition Overall Cognitive Status: Impaired/Different from baseline Arousal/Alertness: Awake/alert Orientation Level: Oriented to person Attention: Sustained Sustained Attention: Impaired Sustained Attention Impairment: Verbal basic Memory: Impaired Memory Impairment: Decreased recall of new information Awareness: Impaired Awareness Impairment: Intellectual impairment Problem Solving: Appears intact Problem Solving Impairment: Verbal basic Cognition Arousal/Alertness: Awake/alert Behavior During Therapy: Flat affect, WFL for tasks assessed/performed Overall Cognitive Status: Impaired/Different from baseline Area of Impairment: Problem solving, Awareness,  Safety/judgement Safety/Judgement: Decreased awareness of safety, Decreased awareness of deficits Awareness: Emergent (able to state balance impairments and initiate correcting but can't maintain corrections) Problem Solving: Decreased initiation, Difficulty sequencing, Requires verbal cues, Requires tactile cues General Comments: follows single step commands consistently with increased time; decreased awareness of safety/deficits and asking often "want me to get up now?" Difficult to assess due to:  (ETT, lethargy)  Physical Exam: Blood pressure (!) 107/58, pulse 79, temperature 97.8 F (36.6 C), temperature source Oral, resp. rate 20, height 5\' 7"  (1.702 m), weight 74 kg, SpO2 100 %. Physical Exam Vitals reviewed.  Constitutional:      General: He is not in acute distress. HENT:     Head: Normocephalic and atraumatic.     Right Ear: External ear normal.     Left Ear: External ear normal.     Nose: Nose normal.  Eyes:     General:        Right eye: No discharge.        Left eye: No discharge.     Extraocular Movements: Extraocular movements intact.  Pulmonary:     Effort: Pulmonary effort is normal. No respiratory distress.     Breath sounds: No stridor.  Abdominal:     General: There is no distension.     Palpations: Abdomen is soft.  Musculoskeletal:     Cervical back: Tenderness present.     Comments: No edema or tenderness in extremities  Skin:    General: Skin is warm and dry.  Neurological:     Mental Status: He is alert and oriented to person, place, and time.     Comments: Slightly lethargic  Left facial droop Left lean Follows simple commands.   Display some decreased awareness of deficits. Motor: RUE/RLE: 5/5 proximal distal LUE/LLE: 0/5 proximal distal Sensation diminished to light touch left side  Psychiatric:        Mood and Affect: Affect is blunt and flat.        Speech: Speech is delayed.  Behavior: Behavior is slowed.        Cognition and  Memory: Cognition is impaired. Memory is impaired.     Results for orders placed or performed during the hospital encounter of 12/06/19 (from the past 48 hour(s))  Glucose, capillary     Status: Abnormal   Collection Time: 12/13/19  8:07 AM  Result Value Ref Range   Glucose-Capillary 119 (H) 70 - 99 mg/dL    Comment: Glucose reference range applies only to samples taken after fasting for at least 8 hours.  Glucose, capillary     Status: Abnormal   Collection Time: 12/13/19 12:00 PM  Result Value Ref Range   Glucose-Capillary 140 (H) 70 - 99 mg/dL    Comment: Glucose reference range applies only to samples taken after fasting for at least 8 hours.  Glucose, capillary     Status: Abnormal   Collection Time: 12/13/19  3:19 PM  Result Value Ref Range   Glucose-Capillary 117 (H) 70 - 99 mg/dL    Comment: Glucose reference range applies only to samples taken after fasting for at least 8 hours.  CBC     Status: None   Collection Time: 12/14/19  6:49 AM  Result Value Ref Range   WBC 10.0 4.0 - 10.5 K/uL   RBC 4.87 4.22 - 5.81 MIL/uL   Hemoglobin 13.1 13.0 - 17.0 g/dL   HCT 41.2 39 - 52 %   MCV 84.6 80.0 - 100.0 fL   MCH 26.9 26.0 - 34.0 pg   MCHC 31.8 30.0 - 36.0 g/dL   RDW 14.7 11.5 - 15.5 %   Platelets 297 150 - 400 K/uL   nRBC 0.2 0.0 - 0.2 %    Comment: Performed at [facility], [place] [address], [place], [place] [address]  Basic metabolic panel     Status: Abnormal   Collection Time: 12/14/19  6:49 AM  Result Value Ref Range   Sodium 141 135 - 145 mmol/L   Potassium 3.5 3.5 - 5.1 mmol/L    Comment: SLIGHT HEMOLYSIS   Chloride 111 98 - 111 mmol/L   CO2 17 (L) 22 - 32 mmol/L   Glucose, Bld 166 (H) 70 - 99 mg/dL    Comment: Glucose reference range applies only to samples taken after fasting for at least 8 hours.   BUN 22 (H) 6 - 20 mg/dL   Creatinine, Ser 1.38 (H) 0.61 - 1.24 mg/dL   Calcium 8.5 (L) 8.9 - 10.3 mg/dL   GFR calc non Af Amer >60 >60 mL/min   GFR calc Af  Amer >60 >60 mL/min   Anion gap 13 5 - 15    Comment: Performed at [facility] [address] [place], [place] [address]  Magnesium     Status: None   Collection Time: 12/14/19  6:49 AM  Result Value Ref Range   Magnesium 2.2 1.7 - 2.4 mg/dL    Comment: Performed at [facility] [address], [place], [place] [address]  Albumin     Status: Abnormal   Collection Time: 12/14/19  6:49 AM  Result Value Ref Range   Albumin 2.9 (L) 3.5 - 5.0 g/dL    Comment: Performed at [facility] [address] [place], [place] [address]  Phosphorus     Status: None   Collection Time: 12/14/19  6:49 AM  Result Value Ref Range   Phosphorus 2.6 2.5 - 4.6 mg/dL    Comment: SLIGHT  HEMOLYSIS Performed at [facility], [place] [address] [place], [place] [address]   Sodium, urine, random     Status: None   Collection Time: 12/14/19  6:30 PM  Result Value Ref Range   Sodium, Ur 136 mmol/L    Comment: Performed at [facility] [address], [place], [place] [address]  CBC     Status: Abnormal   Collection Time: 12/15/19  4:08 AM  Result Value Ref Range   WBC 11.6 (H) 4.0 - 10.5 K/uL   RBC 4.97 4.22 - 5.81 MIL/uL   Hemoglobin 13.4 13.0 - 17.0 g/dL   HCT 42.0 39 - 52 %   MCV 84.5 80.0 - 100.0 fL   MCH 27.0 26.0 - 34.0 pg   MCHC 31.9 30.0 - 36.0 g/dL   RDW 14.7 11.5 - 15.5 %   Platelets 327 150 - 400 K/uL   nRBC 0.0 0.0 - 0.2 %    Comment: Performed at [facility], [place] [address] [place], [place] [address]  Basic metabolic panel     Status: Abnormal   Collection Time: 12/15/19  4:08 AM  Result Value Ref Range   Sodium 140 135 - 145 mmol/L   Potassium 3.6 3.5 - 5.1 mmol/L   Chloride 110 98 - 111 mmol/L   CO2 21 (L) 22 - 32 mmol/L   Glucose, Bld 126 (H) 70 - 99 mg/dL    Comment: Glucose reference range applies only to samples taken after fasting for at least 8 hours.   BUN 19 6 - 20 mg/dL   Creatinine, Ser 1.37 (H) 0.61 - 1.24 mg/dL   Calcium 8.8 (L)  8.9 - 10.3 mg/dL   GFR calc non Af Amer >60 >60 mL/min   GFR calc Af Amer >60 >60 mL/min   Anion gap 9 5 - 15    Comment: Performed at [facility] [address] [place],  [address]  Magnesium     Status: None   Collection Time: 12/15/19  4:08 AM  Result Value Ref Range   Magnesium 2.2 1.7 - 2.4 mg/dL    Comment: Performed at [facility] [address], [place],  [address]   No results found.     Medical Problem List and Plan: 1.  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  Antithrombotics: -DVT/anticoagulation: Subcutaneous heparin initiated 12/11/2019  -antiplatelet therapy: N/A 3. Pain Management: Tylenol as needed 4. Mood: Provide emotional support  -antipsychotic agents: N/A 5. Neuropsych: This patient is?  Fully capable of making decisions on his own behalf. 6. Skin/Wound Care: Routine skin checks 7. Fluids/Electrolytes/Nutrition: Routine in and outs.  CMP ordered. 8.  Post stroke dysphagia.  Dysphagia #2 thin liquids.  Follow-up speech therapy.  Advance diet as tolerated 9.  Left lung pneumonia.  Intravenous Unasyn completed 10.  Hypertension.  Norvasc 10 mg daily, Coreg 25 mg twice daily, clonidine 0.3 mg twice daily, hydralazine 100 mg every 8 hours, lisinopril 10 mg twice daily.    Monitor with increased mobility 11.  Urine drug screen positive marijuana.  Counseled on appropriate  Cathlyn Parsons, PA-C 12/15/2019  I have personally performed a face to face diagnostic evaluation, including, but not limited to relevant history and physical exam findings, of this patient and developed relevant assessment and plan.  Additionally, I have  reviewed and concur with the physician assistant's documentation above.  Delice Lesch, MD, ABPMR

## 2019-12-15 NOTE — Progress Notes (Signed)
Inpatient Rehabilitation Medication Review by a Pharmacist  A complete drug regimen review was completed for this patient to identify any potential clinically significant medication issues.  Clinically significant medication issues were identified:  no  Pharmacist comments:   Time spent performing this drug regimen review (minutes):  10  Vertis Kelch, PharmD, BCPS 12/15/2019       5:41 PM

## 2019-12-15 NOTE — Progress Notes (Signed)
Physical Therapy Treatment Patient Details Name: Dennis Mercado MRN: [id number] DOB: 1980/11/30 Today's Date: 12/15/2019    History of Present Illness 39 y.o. male with a PMHx of HTN who presents via EMS as a Code Stroke after acute onset of left sided weakness and headache within minutes of finishing coitus with his wife. CT head reveals an acute hemorrhage centered in the right thalamus, extending into the ventricles, with local mass effect without midline shift. Pt required intubation 6/22 due to ARF secondary to aspiration PNA. PMH non contributory     PT Comments    Patient continues to make progress toward PT goals and tolerated mobility well. Pt continues to present with flaccid L UE/LE however following commands more consistently and nearing L inattention vs neglect. Pt able to visually track across midline. Wife present throughout and supportive. Pt remains a great candidate for CIR.   Follow Up Recommendations  CIR     Equipment Recommendations  Wheelchair (measurements PT);Wheelchair cushion (measurements PT);Hospital bed    Recommendations for Other Services       Precautions / Restrictions Precautions Precautions: Fall Precaution Comments: L UE/LE flaccid; minimal sensation with noxious stimuli L toes and no sensation L UE     Mobility  Bed Mobility Overal bed mobility: Needs Assistance Bed Mobility: Rolling;Sidelying to Sit Rolling: Min assist Sidelying to sit: Mod assist;+2 for physical assistance;+2 for safety/equipment       General bed mobility comments: multimodal cues for sequencing and assist to bring bilat LE/hips to EOB and to elevate trunk into sitting; use of rail rolling toward L side   Transfers Overall transfer level: Needs assistance Equipment used: Ambulation equipment used Transfers: Sit to/from Stand Sit to Stand: Mod assist;+2 physical assistance;+2 safety/equipment         General transfer comment: L UE supported by gait belt (fashioned  as sling) and therapist assisted with L knee extension when powering up into standing and then to maintain; +2 assist to stand and to maintain balance once in standing  Ambulation/Gait             General Gait Details: unable at this time   Stairs             Wheelchair Mobility    Modified Rankin (Stroke Patients Only) Modified Rankin (Stroke Patients Only) Pre-Morbid Rankin Score: No symptoms Modified Rankin: Severe disability     Balance Overall balance assessment: Needs assistance Sitting-balance support: Feet supported;Single extremity supported Sitting balance-Leahy Scale: Poor Sitting balance - Comments: worked on sitting balance EOB and on stedy standing frame with multimodal cues Postural control: Left lateral lean;Posterior lean Standing balance support: Single extremity supported Standing balance-Leahy Scale: Poor Standing balance comment: bilat weight shifting and correcting to midline posture in standing using stedy standing frame with multimodal cues and L knee supported in extension                             Cognition Arousal/Alertness: Awake/alert Behavior During Therapy: Flat affect (grossly flat however smiles appropriately and joking ) Overall Cognitive Status: Impaired/Different from baseline Area of Impairment: Problem solving;Awareness;Safety/judgement                         Safety/Judgement: Decreased awareness of safety;Decreased awareness of deficits Awareness: Emergent Problem Solving: Decreased initiation;Difficulty sequencing;Requires verbal cues;Requires tactile cues General Comments: pt recalled therapists' names from previous session day before      Exercises  General Comments        Pertinent Vitals/Pain Pain Assessment: Faces Faces Pain Scale: No hurt Pain Intervention(s): Monitored during session    Home Living                      Prior Function            PT Goals (current  goals can now be found in the care plan section) Progress towards PT goals: Progressing toward goals    Frequency    Min 4X/week      PT Plan Current plan remains appropriate    Co-evaluation PT/OT/SLP Co-Evaluation/Treatment: Yes Reason for Co-Treatment: For patient/therapist safety;To address functional/ADL transfers PT goals addressed during session: Mobility/safety with mobility;Balance        AM-PAC PT "6 Clicks" Mobility   Outcome Measure  Help needed turning from your back to your side while in a flat bed without using bedrails?: A Lot Help needed moving from lying on your back to sitting on the side of a flat bed without using bedrails?: Total Help needed moving to and from a bed to a chair (including a wheelchair)?: Total Help needed standing up from a chair using your arms (e.g., wheelchair or bedside chair)?: A Lot Help needed to walk in hospital room?: Total Help needed climbing 3-5 steps with a railing? : Total 6 Click Score: 8    End of Session Equipment Utilized During Treatment: Gait belt Activity Tolerance: Patient tolerated treatment well Patient left: in chair;with call bell/phone within reach;with chair alarm set;with family/visitor present Nurse Communication: Mobility status PT Visit Diagnosis: Other abnormalities of gait and mobility (R26.89);Muscle weakness (generalized) (M62.81);Other symptoms and signs involving the nervous system (R29.898);Hemiplegia and hemiparesis Hemiplegia - Right/Left: Left Hemiplegia - caused by: Nontraumatic intracerebral hemorrhage     Time: 1194-1740 PT Time Calculation (min) (ACUTE ONLY): 46 min  Charges:  $Therapeutic Activity: 8-22 mins $Neuromuscular Re-education: 8-22 mins                     Earney Navy, PTA Acute Rehabilitation Services Pager: [phone] Office: [phone]     Darliss Cheney 12/15/2019, 2:38 PM

## 2019-12-15 NOTE — Discharge Summary (Signed)
Physician Discharge Summary  Dennis Mercado KVQ:[id number] DOB: 12-Feb-1981 DOA: 12/06/2019  PCP: Libby Maw, MD  Admit date: 12/06/2019 Discharge date: 12/15/2019  Admitted From: Home Disposition:  CIR  Recommendations for Outpatient Follow-up:  1. Follow up with PCP in 2-3 weeks 2. Follow up with Neurology as scheduled  Discharge Condition:Stable CODE STATUS:Full Diet recommendation: Dysphagia 2 with thin liquids   Brief/Interim Summary: 39 year old African-American male with past medical history significant for hypertension for which patient was noncompliant compliant with medications.  Patient will was said to have developed left-sided weakness during sexual activity with his wife.  Work-up done revealed right thalamic bleed.  Subsequently, patient became tachypneic, hypoxic with decreased mental status and had to be intubated by the ICU team.  Patient was transferred to the hospitalist team earlier today.  Left-sided hemiplegia persists.  Neurology is directing care.  Discharge Diagnoses:  Active Problems:   ICH (intracerebral hemorrhage) (HCC)   Acute respiratory failure (HCC)  Acute right thalamic intracranial hemorrhage: -Neurology followed -No source of emobolic disease seen -Left hemiplegia persists -Per Neurology, recommendation to continue to titrate BP meds with goal of normotension  -Plan for CIR today  Hypertensive urgency: -Continue Norvasc 10 mg p.o. once daily -Continue Coreg 25 Mg p.o. twice daily -Continue clonidine 0.3 mg p.o. twice daily. -Hydralazine 100 mg p.o. every 8 hourly. -BP improved, continue to titrate bp meds as needed. May consider addition of nitrate if needed  Hypokalemia: -Continue to monitor and replete. -Stable  Hypernatremia: -Sodium has gone down from 148 to 140 -We will be careful with excessive hydration. -Cont to encourage hydration as tolerate   Discharge Instructions  Discharge Instructions    Ambulatory  referral to Neurology   Complete by: As directed    Follow up with stroke clinic NP (Jessica Vanschaick or Cecille Rubin, if both not available, consider Zachery Dauer, or Ahern) at [facility] in about 4 weeks. Thanks.     Allergies as of 12/15/2019   No Known Allergies     Medication List    TAKE these medications   amLODipine 10 MG tablet Commonly known as: NORVASC Take 1 tablet (10 mg total) by mouth daily. Start taking on: December 16, 2019   atorvastatin 40 MG tablet Commonly known as: LIPITOR Take 1 tablet (40 mg total) by mouth daily.   carvedilol 25 MG tablet Commonly known as: COREG Take 1 tablet (25 mg total) by mouth 2 (two) times daily with a meal.   cloNIDine 0.3 MG tablet Commonly known as: CATAPRES Take 1 tablet (0.3 mg total) by mouth 2 (two) times daily.   feeding supplement (ENSURE ENLIVE) Liqd Take 237 mLs by mouth 3 (three) times daily between meals.   hydrALAZINE 100 MG tablet Commonly known as: APRESOLINE Take 1 tablet (100 mg total) by mouth every 8 (eight) hours.   hydrocortisone 2.5 % rectal cream Commonly known as: ANUSOL-HC Place 1 application rectally 3 (three) times daily as needed for hemorrhoids or anal itching.   levETIRAcetam 500 MG tablet Commonly known as: KEPPRA Take 1 tablet (500 mg total) by mouth 2 (two) times daily.   lisinopril 10 MG tablet Commonly known as: ZESTRIL Take 1 tablet (10 mg total) by mouth 2 (two) times daily.   pantoprazole 40 MG tablet Commonly known as: PROTONIX Take 1 tablet (40 mg total) by mouth daily. Start taking on: December 16, 2019   senna 8.6 MG Tabs tablet Commonly known as: SENOKOT Take 1 tablet (8.6 mg total) by mouth 2 (  two) times daily.       Follow-up Information    [facility]. Schedule an appointment as soon as possible for a visit in 4 week(s).   Specialty: Neurology Contact information: [address] [place] [phone]        Libby Maw, MD. Schedule an appointment as soon as possible for a visit in 2 week(s).   Specialty: Family Medicine Contact information: [place] [address] [phone]              No Known Allergies  Consultations:  Neurology  CIR  Procedures/Studies: EEG  Result Date: 12/10/2019 Lora Havens, MD     12/10/2019  4:52 PM Patient Name: Dennis Mercado MRN: [id number] Epilepsy Attending: Lora Havens Referring Physician/Provider: Eliseo Gum, NP Date: 12/10/2019 Duration: 23.13 mins Patient history: 39 year old male with right thalamic ICH with IVH.  Today patient was noted to have an episode where he had left-sided contraction which eventually spread to right side contraction, upper and lower extremity rigidity with rhythmic contraction as well as upward gaze concerning for seizures.  EEG evaluate for seizures. Level of alertness: comatose/ sedated AEDs during EEG study: Ativan, Depakote Technical aspects: This EEG study was done with scalp electrodes positioned according to the 10-20 International system of electrode placement. Electrical activity was acquired at a sampling rate of 500Hz  and reviewed with a high frequency filter of 70Hz  and a low frequency filter of 1Hz . EEG data were recorded continuously and digitally stored. Description: EEG showed continuous low amplitude 2 to 3 Hz delta slowing.  EEG was reactive to tactile stimulation. Hyperventilation and photic stimulation were not performed.   ABNORMALITY -Continuous slow, generalized IMPRESSION: This study is suggestive of severe diffuse encephalopathy, nonspecific to etiology but most likely related to sedation.  No seizures or epileptiform discharges were seen throughout the recording. Dennis Mercado   CT ANGIO HEAD W OR WO CONTRAST  Result Date: 12/06/2019 CLINICAL DATA:  Intracranial hemorrhage centered at the thalamus. Left-sided weakness. EXAM: CT ANGIOGRAPHY HEAD TECHNIQUE:  Multidetector CT imaging of the head was performed using the standard protocol during bolus administration of intravenous contrast. Multiplanar CT image reconstructions and MIPs were obtained to evaluate the vascular anatomy. CONTRAST:  144mL OMNIPAQUE IOHEXOL 350 MG/ML SOLN COMPARISON:  CT head without contrast 12/06/2019 FINDINGS: CTA HEAD Anterior circulation: The internal carotid arteries are within normal limits from high cervical segments through the ICA termini. The A1 and M1 segments are normal. The anterior communicating artery is patent. MCA bifurcations are intact. The ACA and MCA branch vessels are within normal limits. Posterior circulation: Vertebral arteries are codominant. Left PICA origin is visualized and normal. Right AICA is dominant. The basilar artery is normal. Both posterior cerebral arteries originate from basilar tip. PCA branch vessels are within normal limits. No CTA spot sign is present. No vascular lesion is so seated with the area of hemorrhage. Venous sinuses: The dural sinuses are patent. The straight sinus and deep cerebral veins are intact. Cortical veins are unremarkable. No vascular malformation is present. Anatomic variants: None IMPRESSION: 1. Normal variant CTA Circle of Willis without significant proximal stenosis, aneurysm, or branch vessel occlusion. 2. No CTA spot sign or vascular malformation to explain the area of hemorrhage. Electronically Signed   By: San Morelle M.D.   On: 12/06/2019 05:46   DG Abd 1 View  Result Date: 12/10/2019 CLINICAL DATA:  NG tube placement EXAM: ABDOMEN - 1 VIEW  COMPARISON:  12/10/2019 FINDINGS: Partially visualized central venous catheter with the tip projecting over the SVC. No esophageal catheter is visible on the current image. IMPRESSION: No esophageal catheter is visualized on the current images. These results will be called to the ordering clinician or representative by the Radiologist Assistant, and communication documented  in the PACS or [employer]. Electronically Signed   By: Donavan Foil M.D.   On: 12/10/2019 19:27   DG Abd 1 View  Result Date: 12/10/2019 CLINICAL DATA:  Orogastric tube placement. EXAM: ABDOMEN - 1 VIEW COMPARISON:  December 07, 2019. FINDINGS: The bowel gas pattern is normal. Enteric tube tip is seen in proximal stomach. No radio-opaque calculi or other significant radiographic abnormality are seen. IMPRESSION: Enteric tube tip seen in proximal stomach. No evidence of bowel obstruction or ileus. Electronically Signed   By: Marijo Conception M.D.   On: 12/10/2019 08:45   CT HEAD WO CONTRAST  Result Date: 12/11/2019 CLINICAL DATA:  Intracranial hemorrhage, follow-up EXAM: CT HEAD WITHOUT CONTRAST TECHNIQUE: Contiguous axial images were obtained from the base of the skull through the vertex without intravenous contrast. COMPARISON:  12/07/2019 FINDINGS: Brain: Hemorrhage centered within the right thalamus is not substantially changed in size with similar surrounding edema. Intraventricular extension is again noted with some interval redistribution. Similar regional mass effect including effacement of the third ventricle. Lateral ventricles are stable in size. No new hemorrhage.  No loss of gray-white differentiation. Vascular: No new finding. Skull: Calvarium is unremarkable. Sinuses/Orbits: No acute finding. Other: None. IMPRESSION: No substantial change in right thalamic hemorrhage with intraventricular extension since 12/07/2019. No new hemorrhage or worsening mass effect. Stable caliber of ventricles. Electronically Signed   By: Macy Mis M.D.   On: 12/11/2019 17:57   CT HEAD WO CONTRAST  Result Date: 12/07/2019 CLINICAL DATA:  Stroke follow-up.  Intracranial hemorrhage. EXAM: CT HEAD WITHOUT CONTRAST TECHNIQUE: Contiguous axial images were obtained from the base of the skull through the vertex without intravenous contrast. COMPARISON:  12/07/2019 FINDINGS: Brain: Hemorrhage centered in the right  thalamus is unchanged in size measuring 2.9 x 4.2 x 3.5 cm. Mild surrounding edema is unchanged as is 5 mm of leftward midline shift. Intraventricular extension is again noted with unchanged blood products throughout the ventricles including moderate hemorrhage in the right lateral ventricle. Mild dilatation of the lateral ventricles is unchanged. No acute infarct is identified separate from the hemorrhage. There is no extra-axial fluid collection. Vascular: No hyperdense vessel. Skull: No fracture suspicious osseous lesion. Sinuses/Orbits: Visualized paranasal sinuses and mastoid air cells are clear. Visualized orbits are unremarkable. Other: None. IMPRESSION: 1. Unchanged right thalamic and intraventricular hemorrhage. 2. Unchanged 5 mm of leftward midline shift. 3. Unchanged mild dilatation of the lateral ventricles. Electronically Signed   By: Logan Bores M.D.   On: 12/07/2019 09:53   CT HEAD WO CONTRAST  Result Date: 12/07/2019 CLINICAL DATA:  Intracranial hemorrhage. EXAM: CT HEAD WITHOUT CONTRAST TECHNIQUE: Contiguous axial images were obtained from the base of the skull through the vertex without intravenous contrast. COMPARISON:  CT head without contrast 12/06/2019 CTA of the head 12/06/2019 FINDINGS: Brain: Right thalamic hemorrhage is stable in size. Surrounding edema is slightly more evident, as expected intraventricular hemorrhage is again noted. Blood is predominantly seen in the right lateral ventricle. Hemorrhage can be seen in the third and fourth ventricles as well. Blood is noted in the inferior fourth ventricle foramina. Ventricles are slightly more prominent than on the prior exams. Transverse measurement of  the frontal horns is 31 mm compared with 29 mm. 4-5 mm leftward shift is stable. Vascular: No hyperdense vessel or unexpected calcification. Skull: Calvarium is intact. No focal lytic or blastic lesions are present. No significant extracranial soft tissue lesion is present.  Sinuses/Orbits: The paranasal sinuses and mastoid air cells are clear. The globes and orbits are within normal limits. IMPRESSION: 1. Stable size of right thalamic hemorrhage with slightly more surrounding edema. Normal expected evolution of the hemorrhage. 2. Stable intraventricular hemorrhage. 3. Slight increase in size of ventricles. 4. Stable 4-5 mm leftward shift. Electronically Signed   By: San Morelle M.D.   On: 12/07/2019 04:40   CT HEAD WO CONTRAST  Result Date: 12/06/2019 CLINICAL DATA:  Intracranial hemorrhage, follow-up EXAM: CT HEAD WITHOUT CONTRAST TECHNIQUE: Contiguous axial images were obtained from the base of the skull through the vertex without intravenous contrast. COMPARISON:  Earlier same day FINDINGS: Brain: No significant change in size or appearance of parenchymal hemorrhage centered within the right thalamus and adjacent white matter. Similar mild surrounding edema and regional mass effect. Intraventricular extension is again noted. Lateral ventricles remain mildly prominent. No new hemorrhage or loss of gray-white differentiation. Vascular: No new findings. Skull: Unremarkable Sinuses/Orbits: No acute abnormality. Other: None. IMPRESSION: Similar parenchymal hemorrhage centered within the right thalamus with intraventricular extension. Stable mild prominence of the lateral ventricles. No new hemorrhage or worsening mass effect. Electronically Signed   By: Macy Mis M.D.   On: 12/06/2019 16:53   CT ANGIO CHEST PE W OR WO CONTRAST  Result Date: 12/07/2019 CLINICAL DATA:  Hypoxemia, stroke symptoms, intracranial hemorrhage EXAM: CT ANGIOGRAPHY CHEST WITH CONTRAST TECHNIQUE: Multidetector CT imaging of the chest was performed using the standard protocol during bolus administration of intravenous contrast. Multiplanar CT image reconstructions and MIPs were obtained to evaluate the vascular anatomy. CONTRAST:  140 mL OMNIPAQUE IOHEXOL 350 MG/ML SOLN COMPARISON:  Chest  radiographs, 12/07/2019 FINDINGS: Cardiovascular: Examination is essentially nondiagnostic for pulmonary embolism secondary to poor contrast bolus (main pulmonary artery 159 HU), streak artifact related to patient arm positioning, and breath motion. There is no obvious central pulmonary embolism. Assessment of the lobar and more distal vessels is inadequate to exclude pulmonary embolism. Normal heart size. No pericardial effusion. Mediastinum/Nodes: No enlarged mediastinal, hilar, or axillary lymph nodes. Endotracheal intubation. Frothy debris in the lower trachea and mainstem bronchi. Thyroid gland and esophagus demonstrate no significant findings. Lungs/Pleura: Moderate left, small right pleural effusions with associated atelectasis or consolidation and scattered, non dependent heterogeneous airspace opacity, particularly of the left lung (series 12, image 25). Upper Abdomen: No acute abnormality. Tiny nonobstructive calculus of the superior pole of the right kidney. Musculoskeletal: No chest wall abnormality. No acute or significant osseous findings. Review of the MIP images confirms the above findings. IMPRESSION: 1. Examination is essentially nondiagnostic for pulmonary embolism secondary to poor contrast bolus, streak artifact, and breath motion. A technical repeat bolus was attempted. There is no obvious central pulmonary embolism. Assessment of the lobar and more distal vessels is inadequate to exclude pulmonary embolism. 2. Moderate left, small right pleural effusions with associated atelectasis or consolidation and scattered, non dependent heterogeneous airspace opacity, particularly of the left lung. Findings are generally suspicious for aspiration given mental status and debris present in the trachea and mainstem bronchi. Electronically Signed   By: Eddie Candle M.D.   On: 12/07/2019 09:49   MR BRAIN WO CONTRAST  Result Date: 12/08/2019 CLINICAL DATA:  Acute onset left-sided weakness.  Headache.  EXAM: MRI HEAD WITHOUT CONTRAST TECHNIQUE: Multiplanar, multiecho pulse sequences of the brain and surrounding structures were obtained without intravenous contrast. COMPARISON:  None. FINDINGS: BRAIN: There is a large intraparenchymal hematoma within the right thalamus, unchanged in size compared to the earlier CT. Local mass effect is unchanged. There is mild surrounding edema. Blood extends into the lateral ventricles. There is no hydrocephalus or ventricular entrapment. There is no abnormal diffusion restriction. Normal volume of CSF spaces. There are approximately 5 scattered foci of chronic microhemorrhage. Normal midline structures. VASCULAR: Major flow voids are preserved. SKULL AND UPPER CERVICAL SPINE: Normal calvarium and skull base. Visualized upper cervical spine and soft tissues are normal. SINUSES/ORBITS: No paranasal sinus fluid levels or advanced mucosal thickening. No mastoid or middle ear effusion. Normal orbits. IMPRESSION: 1. Unchanged size of right thalamic intraparenchymal hematoma with mild surrounding edema. No hydrocephalus or ventricular entrapment. 2. 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 Electronically Signed   By: Ulyses Jarred M.D.   On: 12/08/2019 03:21   DG CHEST PORT 1 VIEW  Result Date: 12/11/2019 CLINICAL DATA:  Fever for 2 days. EXAM: PORTABLE CHEST 1 VIEW COMPARISON:  12/08/2019 FINDINGS: Endotracheal and nasogastric tubes have been removed. Left internal jugular central venous line is stable. Hazy lung opacity noted on the left on the prior study has mostly resolved. No new lung abnormalities. No pleural effusion or pneumothorax. IMPRESSION: 1. Significant improvement compared to the prior study with near resolution of the previously seen hazy left lung airspace opacity. No new abnormalities. 2. Status post extubation and removal of the nasogastric tube. Electronically Signed   By: Lajean Manes M.D.   On: 12/11/2019 11:52    DG Chest Port 1 View  Result Date: 12/08/2019 CLINICAL DATA:  Endotracheal tube. EXAM: PORTABLE CHEST 1 VIEW COMPARISON:  December 07, 2019. FINDINGS: Stable cardiomediastinal silhouette. Endotracheal and nasogastric tubes are unchanged in position. Left internal jugular catheter is unchanged. No pneumothorax is noted. Stable large left lung opacity is noted concerning for pneumonia. Improved right basilar opacity is noted. No significant pleural effusion is noted. Bony thorax is unremarkable. IMPRESSION: Stable support apparatus. Stable large left lung opacity concerning for pneumonia. Improved right basilar opacity is noted. No significant pleural effusion is noted. Electronically Signed   By: Marijo Conception M.D.   On: 12/08/2019 08:34   Portable Chest x-ray  Result Date: 12/07/2019 CLINICAL DATA:  Intubation. EXAM: PORTABLE CHEST 1 VIEW COMPARISON:  12/07/2019. FINDINGS: Endotracheal tube 3 cm above the carina. Left IJ line noted with tip at cavoatrial junction. NG tube noted with tip in the upper most portion of the stomach. Heart size stable. Persistent bilateral pulmonary infiltrates most prominent throughout the left lung. Similar findings noted on prior exam. Small left pleural effusion cannot be excluded. No pneumothorax. No acute bony abnormality identified. IMPRESSION: 1.  Lines and tubes in good anatomic position. 2. Persistent bilateral pulmonary infiltrates most prominent throughout the left lung. Similar findings on prior exam. Small left pleural effusion cannot be excluded. Electronically Signed   By: Marcello Moores  Register   On: 12/07/2019 08:20   DG CHEST PORT 1 VIEW  Result Date: 12/07/2019 CLINICAL DATA:  Shortness of breath. EXAM: PORTABLE CHEST 1 VIEW COMPARISON:  One-view chest x-ray 12/06/2019. FINDINGS: Heart is enlarged. Progressive interstitial and airspace disease is evident, left greater than right. Left pleural effusion is new. IMPRESSION: 1. Progressive interstitial and airspace  disease, left greater than right. Findings are concerning for asymmetric neurogenic edema versus pneumonia.  2. New left pleural effusion. Electronically Signed   By: San Morelle M.D.   On: 12/07/2019 07:36   DG Chest Port 1 View  Result Date: 12/06/2019 CLINICAL DATA:  Left-sided paralysis and slurred speech EXAM: PORTABLE CHEST 1 VIEW COMPARISON:  None. FINDINGS: The heart size and mediastinal contours are within normal limits. Both lungs are clear. The visualized skeletal structures are unremarkable. IMPRESSION: No active disease. Electronically Signed   By: Prudencio Pair M.D.   On: 12/06/2019 05:15   DG Abd Portable 1V  Result Date: 12/07/2019 CLINICAL DATA:  OG tube placement. EXAM: PORTABLE ABDOMEN - 1 VIEW 11 a.m. COMPARISON:  Chest x-ray dated 12/07/2019 at 7:53 a.m. And chest CT dated 12/07/2019 FINDINGS: NG tube tip is in the fundus of the stomach. Bowel gas pattern is normal. Bones are normal. Urinary bladder is distended with contrast. Persistent density at the left lung base consistent with the lung consolidation noted on the prior chest CT. IMPRESSION: 1. NG tube tip in the fundus of the stomach. 2. Benign-appearing abdomen. 3. Urinary bladder is distended with contrast. Electronically Signed   By: Lorriane Shire M.D.   On: 12/07/2019 11:38   DG Swallowing Func-Speech Pathology  Result Date: 12/12/2019 Objective Swallowing Evaluation: Type of Study: MBS-Modified Barium Swallow Study  Patient Details Name: Dennis Mercado MRN: [id number] Date of Birth: 03/16/1981 Today's Date: 12/12/2019 Time: SLP Start Time (ACUTE ONLY): 1130 -SLP Stop Time (ACUTE ONLY): 1200 SLP Time Calculation (min) (ACUTE ONLY): 30 min Past Medical History: No past medical history on file. Past Surgical History: No past surgical history on file. HPI: Korben Carcione is an 39 y.o. male with a PMHx of HTN who presents via EMS as a Code Stroke after acute onset of left sided weakness and headache.  His wife noted that  his left side was weak and that he was starting to look altered. Left facial weakness was also noted. His wife then called EMS. On arrival EMS noted the same deficits. Vitals per EMS: BP 207/148, HR 92, 97% on RA, CBG 122, afebrile. LKN was 0315.  Initial CT of the head was showing Acute hemorrhage centered in the right thalamus measures 3.6 x 2.8 x 2.6 cm. Estimated volume is 13.1 mL.  Hemorrhage extends into the ventricles and can be seen in the  Subjective: The patient was seen in radiology sitting upright in a chair.  Assessment / Plan / Recommendation CHL IP CLINICAL IMPRESSIONS 12/12/2019 Clinical Impression MBS was completed using thin liquids via spoon, self fed cup and straw sips, pureed material and dual textured solids.  He presented with a mild oral dysphagia.  Transient penetration was seen during the swallow given thin liquids via spoon, cup and straw.  Recommend a dysphagia 2 diet with thin liquids.  Suggest full supervision for intake due to his impulsivity to pace intake.  ST will follow during acute stay and he would benefit from continued follow up at the next level of care.  Please see below for information regarding swallow physiology.  ORAL PHASE -Fair lingual control particularly given thin liquids.  This led to poor bolus control and allowed material to fall the level of the pyriform sinuses prior to the swallow trigger. -Mildly reduced lingual movement which led to a delay in bolus transport particularly seen during dual textured bolus trials. -Oral residue across texture trials.  He was generally sensate to this residue and able to recollect and clear oral cavity when there was a significant amount of residue.  PHARYNGEAL PHASE -Functional pharyngeal swallow.  No pharyngeal residue was seen post swallow. -Timely swallow trigger. -Transient penetration of thin liquids during the swallow.  This material was noted to completely clear the laryngeal vestibule. ESOPHAGEAL PHASE -Sweep revealed it  slow to clear.   SLP Visit Diagnosis Dysphagia, oral phase (R13.11) Attention and concentration deficit following -- Frontal lobe and executive function deficit following -- Impact on safety and function Mild aspiration risk   CHL IP TREATMENT RECOMMENDATION 12/12/2019 Treatment Recommendations Therapy as outlined in treatment plan below   Prognosis 12/12/2019 Prognosis for Safe Diet Advancement Good Barriers to Reach Goals Cognitive deficits Barriers/Prognosis Comment -- CHL IP DIET RECOMMENDATION 12/12/2019 SLP Diet Recommendations Dysphagia 2 (Fine chop) solids;Thin liquid Liquid Administration via Cup;Straw Medication Administration Whole meds with liquid Compensations Slow rate;Small sips/bites Postural Changes Remain semi-upright after after feeds/meals (Comment);Seated upright at 90 degrees   CHL IP OTHER RECOMMENDATIONS 12/12/2019 Recommended Consults -- Oral Care Recommendations Oral care BID Other Recommendations Have oral suction available   [facility]L IP FOLLOW UP RECOMMENDATIONS 12/12/2019 Follow up Recommendations Inpatient Rehab   [facility]L IP FREQUENCY AND DURATION 12/12/2019 Speech Therapy Frequency (ACUTE ONLY) min 2x/week Treatment Duration 2 weeks      CHL IP ORAL PHASE 12/12/2019 Oral Phase Impaired Oral - Pudding Teaspoon -- Oral - Pudding Cup -- Oral - Honey Teaspoon -- Oral - Honey Cup -- Oral - Nectar Teaspoon -- Oral - Nectar Cup -- Oral - Nectar Straw -- Oral - Thin Teaspoon Decreased bolus cohesion;Premature spillage;Lingual/palatal residue Oral - Thin Cup Decreased bolus cohesion;Premature spillage;Lingual/palatal residue Oral - Thin Straw Premature spillage;Decreased bolus cohesion;Lingual/palatal residue Oral - Puree Decreased bolus cohesion;Lingual/palatal residue;Delayed oral transit Oral - Mech Soft -- Oral - Regular -- Oral - Multi-Consistency Lingual/palatal residue;Decreased bolus cohesion;Delayed oral transit Oral - Pill -- Oral Phase - Comment --  CHL IP PHARYNGEAL PHASE 12/12/2019 Pharyngeal Phase  WFL Pharyngeal- Pudding Teaspoon -- Pharyngeal -- Pharyngeal- Pudding Cup -- Pharyngeal -- Pharyngeal- Honey Teaspoon -- Pharyngeal -- Pharyngeal- Honey Cup -- Pharyngeal -- Pharyngeal- Nectar Teaspoon -- Pharyngeal -- Pharyngeal- Nectar Cup -- Pharyngeal -- Pharyngeal- Nectar Straw -- Pharyngeal -- Pharyngeal- Thin Teaspoon -- Pharyngeal -- Pharyngeal- Thin Cup -- Pharyngeal -- Pharyngeal- Thin Straw -- Pharyngeal -- Pharyngeal- Puree -- Pharyngeal -- Pharyngeal- Mechanical Soft -- Pharyngeal -- Pharyngeal- Regular -- Pharyngeal -- Pharyngeal- Multi-consistency -- Pharyngeal -- Pharyngeal- Pill -- Pharyngeal -- Pharyngeal Comment --  CHL IP CERVICAL ESOPHAGEAL PHASE 12/12/2019 Cervical Esophageal Phase WFL Pudding Teaspoon -- Pudding Cup -- Honey Teaspoon -- Honey Cup -- Nectar Teaspoon -- Nectar Cup -- Nectar Straw -- Thin Teaspoon -- Thin Cup -- Thin Straw -- Puree -- Mechanical Soft -- Regular -- Multi-consistency -- Pill -- Cervical Esophageal Comment -- Shelly Flatten, MA, CCC-SLP Acute Rehab SLP [phone] Lamar Sprinkles 12/12/2019, 12:19 PM              ECHOCARDIOGRAM COMPLETE  Result Date: 12/07/2019    ECHOCARDIOGRAM REPORT   Patient Name:   Dennis Mercado Date of Exam: 12/07/2019 Medical Rec #:  [id number]      Height:       67.0 in Accession #:    [id number]     Weight:       165.6 lb Date of Birth:  01/05/1981      BSA:          1.866 m Patient Age:    56 years       BP:  131/94 mmHg Patient Gender: M              HR:           115 bpm. Exam Location:  Inpatient Procedure: 2D Echo, Cardiac Doppler and Color Doppler Indications:    Stroke  History:        Patient has no prior history of Echocardiogram examinations.  Sonographer:    Clayton Lefort RDCS (AE) Referring Phys: [id number] SHARON L BIBY  Sonographer Comments: Echo performed with patient supine and on artificial respirator. IMPRESSIONS  1. Left ventricular ejection fraction, by estimation, is 60 to 65%. The left ventricle has normal function.  The left ventricle has no regional wall motion abnormalities. There is severe concentric left ventricular hypertrophy. Left ventricular diastolic  function could not be evaluated.  2. Right ventricular systolic function is normal. The right ventricular size is normal. Tricuspid regurgitation signal is inadequate for assessing PA pressure.  3. The mitral valve is normal in structure. Trivial mitral valve regurgitation. No evidence of mitral stenosis.  4. The aortic valve is tricuspid. Aortic valve regurgitation is not visualized. Mild aortic valve sclerosis is present, with no evidence of aortic valve stenosis.  5. Aortic dilatation noted. There is mild dilatation of the aortic root measuring 39 mm.  6. The inferior vena cava is dilated in size with <50% respiratory variability, suggesting right atrial pressure of 15 mmHg. FINDINGS  Left Ventricle: Left ventricular ejection fraction, by estimation, is 60 to 65%. The left ventricle has normal function. The left ventricle has no regional wall motion abnormalities. The left ventricular internal cavity size was normal in size. There is  severe concentric left ventricular hypertrophy. Left ventricular diastolic function could not be evaluated. Right Ventricle: The right ventricular size is normal. No increase in right ventricular wall thickness. Right ventricular systolic function is normal. Tricuspid regurgitation signal is inadequate for assessing PA pressure. Left Atrium: Left atrial size was normal in size. Right Atrium: Right atrial size was normal in size. Pericardium: There is no evidence of pericardial effusion. Mitral Valve: The mitral valve is normal in structure. Normal mobility of the mitral valve leaflets. Trivial mitral valve regurgitation. No evidence of mitral valve stenosis. MV peak gradient, 5.0 mmHg. The mean mitral valve gradient is 2.0 mmHg. Tricuspid Valve: The tricuspid valve is normal in structure. Tricuspid valve regurgitation is not demonstrated.  No evidence of tricuspid stenosis. Aortic Valve: The aortic valve is tricuspid. Aortic valve regurgitation is not visualized. Mild aortic valve sclerosis is present, with no evidence of aortic valve stenosis. Aortic valve mean gradient measures 4.0 mmHg. Aortic valve peak gradient measures 8.2 mmHg. Aortic valve area, by VTI measures 3.58 cm. Pulmonic Valve: The pulmonic valve was normal in structure. Pulmonic valve regurgitation is not visualized. No evidence of pulmonic stenosis. Aorta: Aortic dilatation noted. There is mild dilatation of the aortic root measuring 39 mm. Venous: The inferior vena cava is dilated in size with less than 50% respiratory variability, suggesting right atrial pressure of 15 mmHg. IAS/Shunts: No atrial level shunt detected by color flow Doppler. Additional Comments: There is a small pleural effusion in the left lateral region.  LEFT VENTRICLE PLAX 2D LVIDd:         3.60 cm  Diastology LVIDs:         2.40 cm  LV e' lateral: 5.55 cm/s LV PW:         1.50 cm LV IVS:        1.80 cm LVOT diam:  2.20 cm LV SV:         79 LV SV Index:   42 LVOT Area:     3.80 cm  RIGHT VENTRICLE             IVC RV Basal diam:  2.30 cm     IVC diam: 2.20 cm RV S prime:     15.70 cm/s TAPSE (M-mode): 1.6 cm LEFT ATRIUM             Index       RIGHT ATRIUM           Index LA diam:        2.90 cm 1.55 cm/m  RA Area:     15.70 cm LA Vol (A2C):   75.7 ml 40.56 ml/m RA Volume:   37.60 ml  20.15 ml/m LA Vol (A4C):   35.1 ml 18.81 ml/m LA Biplane Vol: 52.2 ml 27.97 ml/m  AORTIC VALVE AV Area (Vmax):    3.80 cm AV Area (Vmean):   4.04 cm AV Area (VTI):     3.58 cm AV Vmax:           143.00 cm/s AV Vmean:          89.400 cm/s AV VTI:            0.221 m AV Peak Grad:      8.2 mmHg AV Mean Grad:      4.0 mmHg LVOT Vmax:         143.00 cm/s LVOT Vmean:        94.900 cm/s LVOT VTI:          0.208 m LVOT/AV VTI ratio: 0.94  AORTA Ao Root diam: 3.90 cm Ao Asc diam:  3.50 cm MITRAL VALVE MV Peak grad: 5.0 mmHg   SHUNTS MV Mean grad: 2.0 mmHg  Systemic VTI:  0.21 m MV Vmax:      1.12 m/s  Systemic Diam: 2.20 cm MV Vmean:     69.7 cm/s Fransico Him MD Electronically signed by Fransico Him MD Signature Date/Time: 12/07/2019/3:36:46 PM    Final    CT HEAD CODE STROKE WO CONTRAST  Result Date: 12/06/2019 CLINICAL DATA:  Code stroke.  Acute onset of left-sided weakness. EXAM: CT HEAD WITHOUT CONTRAST TECHNIQUE: Contiguous axial images were obtained from the base of the skull through the vertex without intravenous contrast. COMPARISON:  None. FINDINGS: Brain: Acute hemorrhage centered in the right thalamus measures 3.6 x 2.8 x 2.6 cm. Intraventricular extension is present. Blood is seen in both lateral ventricles, the third ventricle, and the fourth ventricle. No hydrocephalus is present. Focal mass effect is present without significant midline shift. No significant extra-axial hemorrhage is present. And stem and cerebellum are otherwise within normal limits. Vascular: No hyperdense vessel or unexpected calcification. Skull: Calvarium is intact. No focal lytic or blastic lesions are present. No significant extracranial soft tissue lesion is present. Sinuses/Orbits: The paranasal sinuses and mastoid air cells are clear. The globes and orbits are within normal limits. IMPRESSION: 1. Acute hemorrhage centered in the right thalamus measures 3.6 x 2.8 x 2.6 cm. Estimated volume is 13.1 mL. 2. Hemorrhage extends into the ventricles and can be seen in the third and fourth ventricles without hydrocephalus. 3. Local mass effect without midline shift. The above was relayed via text pager to Dr. Cheral Marker on 12/06/2019 at 04:26 . Electronically Signed   By: San Morelle M.D.   On: 12/06/2019 04:31   VAS US CAROTID  Result Date: 12/08/2019 Carotid Arterial Duplex Study Indications:       CVA. Risk Factors:      Hypertension. Limitations        Today's exam was limited due to patient on a ventilator, the                     patient's respiratory variation, the patient's inability or                    unwillingness to cooperate and a central line. Comparison Study:  No prior study Performing Technologist: Maudry Mayhew MHA, RDMS, RVT, RDCS  Examination Guidelines: A complete evaluation includes B-mode imaging, spectral Doppler, color Doppler, and power Doppler as needed of all accessible portions of each vessel. Bilateral testing is considered an integral part of a complete examination. Limited examinations for reoccurring indications may be performed as noted.  Right Carotid Findings: +----------+--------+--------+--------+------------------+--------+           PSV cm/sEDV cm/sStenosisPlaque DescriptionComments +----------+--------+--------+--------+------------------+--------+ CCA Prox  68      13                                         +----------+--------+--------+--------+------------------+--------+ CCA Distal81      27                                         +----------+--------+--------+--------+------------------+--------+ ICA Prox  30      12                                         +----------+--------+--------+--------+------------------+--------+ ICA Distal42      19                                         +----------+--------+--------+--------+------------------+--------+ ECA       111     18                                         +----------+--------+--------+--------+------------------+--------+ +----------+--------+-------+----------------+-------------------+           PSV cm/sEDV cmsDescribe        Arm Pressure (mmHG) +----------+--------+-------+----------------+-------------------+ Subclavian101            Multiphasic, WNL                    +----------+--------+-------+----------------+-------------------+ +---------+--------+--+--------+--+---------+ VertebralPSV cm/s56EDV cm/s27Antegrade +---------+--------+--+--------+--+---------+  Left Carotid  Findings: +----------+--------+--------+--------+------------------+------------------+           PSV cm/sEDV cm/sStenosisPlaque DescriptionComments           +----------+--------+--------+--------+------------------+------------------+ CCA Distal70      20                                intimal thickening +----------+--------+--------+--------+------------------+------------------+ ICA Prox  70      26                                                   +----------+--------+--------+--------+------------------+------------------+  ECA       135     17                                                   +----------+--------+--------+--------+------------------+------------------+ Limited evaluation of left carotid system due to central line and bandaging.  Summary: Right Carotid: Velocities in the right ICA are consistent with a 1-39% stenosis. Left Carotid: Velocities in the visualized segment of the left ICA are               consistent with a 1-39% stenosis. Vertebrals:  Right vertebral artery demonstrates antegrade flow. Left vertebral              artery was not visualized. Subclavians: Left subclavian artery was not visualized. Normal flow hemodynamics              were seen in the right subclavian artery. *See table(s) above for measurements and observations.  Electronically signed by Antony Contras MD on 12/08/2019 at 6:15:37 PM.    Final    VAS [place] UPPER EXTREMITY VENOUS DUPLEX  Result Date: 12/12/2019 UPPER VENOUS STUDY  Indications: Edema, and ICH Limitations: Line. Comparison Study: No prior study on file for comparison Performing Technologist: Sharion Dove RVS  Examination Guidelines: A complete evaluation includes B-mode imaging, spectral Doppler, color Doppler, and power Doppler as needed of all accessible portions of each vessel. Bilateral testing is considered an integral part of a complete examination. Limited examinations for reoccurring indications may be performed as  noted.  Right Findings: +----------+------------+---------+-----------+----------+-------+ RIGHT     CompressiblePhasicitySpontaneousPropertiesSummary +----------+------------+---------+-----------+----------+-------+ Subclavian               Yes       Yes                      +----------+------------+---------+-----------+----------+-------+  Left Findings: +----------+------------+---------+-----------+----------+---------------+ LEFT      CompressiblePhasicitySpontaneousProperties    Summary     +----------+------------+---------+-----------+----------+---------------+ IJV                      Yes       Yes                              +----------+------------+---------+-----------+----------+---------------+ Subclavian               Yes       Yes                              +----------+------------+---------+-----------+----------+---------------+ Axillary                 Yes       Yes                              +----------+------------+---------+-----------+----------+---------------+ Brachial      Full       Yes       Yes                              +----------+------------+---------+-----------+----------+---------------+ Radial        Full                                                  +----------+------------+---------+-----------+----------+---------------+  Ulnar                                               patent by Color +----------+------------+---------+-----------+----------+---------------+ Cephalic      Full                                                  +----------+------------+---------+-----------+----------+---------------+ Basilic       Full                                                  +----------+------------+---------+-----------+----------+---------------+  Summary:  Right: No evidence of thrombosis in the subclavian.  Left: No evidence of deep vein thrombosis in the upper extremity. No evidence of  superficial vein thrombosis in the upper extremity. Interstitial fluid noted throughout forearm.  *See table(s) above for measurements and observations.  Diagnosing physician: Deitra Mayo MD Electronically signed by Deitra Mayo MD on 12/12/2019 at 6:51:23 PM.    Final      Subjective: Eager to start rehab  Discharge Exam: Vitals:   12/15/19 0919 12/15/19 1204  BP: (!) 141/105 102/82  Pulse: 72 79  Resp: 20 18  Temp: 99.6 F (37.6 C) 98.3 F (36.8 C)  SpO2: 99% 100%   Vitals:   12/14/19 2308 12/15/19 0453 12/15/19 0919 12/15/19 1204  BP: (!) 164/115 (!) 107/58 (!) 141/105 102/82  Pulse: 78 79 72 79  Resp: (!) 22 20 20 18   Temp: 98.3 F (36.8 C) 97.8 F (36.6 C) 99.6 F (37.6 C) 98.3 F (36.8 C)  TempSrc: Oral Oral Axillary Oral  SpO2: 98% 100% 99% 100%  Weight:      Height:        General: Pt is alert, awake, not in acute distress Cardiovascular: RRR, S1/S2 +, no rubs, no gallops Respiratory: CTA bilaterally, no wheezing, no rhonchi Abdominal: Soft, NT, ND, bowel sounds + Extremities: no edema, no cyanosis   The results of significant diagnostics from this hospitalization (including imaging, microbiology, ancillary and laboratory) are listed below for reference.     Microbiology: Recent Results (from the past 240 hour(s))  SARS Coronavirus 2 by RT PCR (hospital order, performed in [facility] hospital lab) Nasopharyngeal Nasopharyngeal Swab     Status: None   Collection Time: 12/06/19  4:36 AM   Specimen: Nasopharyngeal Swab  Result Value Ref Range Status   SARS Coronavirus 2 NEGATIVE NEGATIVE Final    Comment: (NOTE) SARS-CoV-2 target nucleic acids are NOT DETECTED.  The SARS-CoV-2 RNA is generally detectable in upper and lower respiratory specimens during the acute phase of infection. The lowest concentration of SARS-CoV-2 viral copies this assay can detect is 250 copies / mL. A negative result does not preclude SARS-CoV-2 infection and should  not be used as the sole basis for treatment or other patient management decisions.  A negative result may occur with improper specimen collection / handling, submission of specimen other than nasopharyngeal swab, presence of viral mutation(s) within the areas targeted by this assay, and inadequate number of viral copies (<250 copies / mL). A negative result must be combined with  clinical observations, patient history, and epidemiological information.  Fact Sheet for Patients:   StrictlyIdeas.no  Fact Sheet for Healthcare Providers: BankingDealers.co.za  This test is not yet approved or  cleared by the [place] FDA and has been authorized for detection and/or diagnosis of SARS-CoV-2 by FDA under an Emergency Use Authorization (EUA).  This EUA will remain in effect (meaning this test can be used) for the duration of the COVID-19 declaration under Section 564(b)(1) of the Act, 21 U.S.C. section 360bbb-3(b)(1), unless the authorization is terminated or revoked sooner.  Performed at [facility], [place] [address] [place], [place] [address]   MRSA PCR Screening     Status: None   Collection Time: 12/06/19  9:47 AM   Specimen: Nasal Mucosa; Nasopharyngeal  Result Value Ref Range Status   MRSA by PCR NEGATIVE NEGATIVE Final    Comment:        The GeneXpert MRSA Assay (FDA approved for NASAL specimens only), is one component of a comprehensive MRSA colonization surveillance program. It is not intended to diagnose MRSA infection nor to guide or monitor treatment for MRSA infections. Performed at [facility], [place] [address], [place], [place] [address]   Culture, respiratory     Status: None   Collection Time: 12/07/19 10:13 AM   Specimen: Bronchial Alveolar Lavage; Respiratory  Result Value Ref Range Status   Specimen Description Bronch Lavag  Final   Special Requests NONE  Final   Gram Stain   Final    RARE WBC  PRESENT,BOTH PMN AND MONONUCLEAR NO ORGANISMS SEEN    Culture   Final    Consistent with normal respiratory flora. Performed at [facility], [place] [address], [place], [place] [address]    Report Status 12/09/2019 FINAL  Final  Culture, blood (Routine X 2) w Reflex to ID Panel     Status: None (Preliminary result)   Collection Time: 12/11/19 11:16 AM   Specimen: BLOOD  Result Value Ref Range Status   Specimen Description BLOOD RIGHT ANTECUBITAL  Final   Special Requests   Final    BOTTLES DRAWN AEROBIC AND ANAEROBIC Blood Culture adequate volume   Culture   Final    NO GROWTH 4 DAYS Performed at [facility], [place] [address] [place], [place] [address]    Report Status PENDING  Incomplete  Culture, blood (Routine X 2) w Reflex to ID Panel     Status: None (Preliminary result)   Collection Time: 12/11/19 11:23 AM   Specimen: BLOOD RIGHT HAND  Result Value Ref Range Status   Specimen Description BLOOD RIGHT HAND  Final   Special Requests   Final    BOTTLES DRAWN AEROBIC AND ANAEROBIC Blood Culture adequate volume   Culture   Final    NO GROWTH 4 DAYS Performed at [facility], [place] [address], [place], [place] [address]    Report Status PENDING  Incomplete     Labs: BNP (last 3 results) No results for input(s): BNP in the last 8760 hours. Basic Metabolic Panel: Recent Labs  Lab 12/08/19 1711 12/09/19 0006 12/09/19 0444 12/09/19 1030 12/09/19 1635 12/09/19 2157 12/10/19 0423 12/10/19 1020 12/11/19 0326 12/11/19 0800 12/12/19 0500 12/12/19 0920 12/13/19 0550 12/14/19 0649 12/15/19 0408  NA 151*   < > 150*   < > 153*   < > 155*   < > 157*   < > 154* 155* 148* 141 140  K  --    < >  3.8  --   --   --  3.7  --  3.0*  --  3.6  --  2.9* 3.5 3.6  CL  --    < > 113*  --   --   --  119*  --  116*  --  120*  --  112* 111 110  CO2  --    < > 27  --   --   --  26  --  24  --  24  --  24 17* 21*  GLUCOSE  --    < > 146*  --   --   --  94  --  105*  --  130*   --  105* 166* 126*  BUN  --    < > 15  --   --   --  22*  --  28*  --  35*  --  26* 22* 19  CREATININE  --    < > 1.49*  --   --   --  1.51*  --  1.74*  --  1.52*  --  1.45* 1.38* 1.37*  CALCIUM  --    < > 9.0  --   --   --  8.7*  --  8.6*  --  8.8*  --  8.8* 8.5* 8.8*  MG 1.8  --  2.0  --  2.2  --  2.1  --   --   --   --   --   --  2.2 2.2  PHOS 3.4  --  4.6  --  3.9  --  3.1  --   --   --   --   --   --  2.6  --    < > = values in this interval not displayed.   Liver Function Tests: Recent Labs  Lab 12/14/19 0649  ALBUMIN 2.9*   No results for input(s): LIPASE, AMYLASE in the last 168 hours. No results for input(s): AMMONIA in the last 168 hours. CBC: Recent Labs  Lab 12/10/19 0423 12/12/19 0500 12/13/19 0550 12/14/19 0649 12/15/19 0408  WBC 11.4* 11.8* 9.7 10.0 11.6*  NEUTROABS 9.0*  --   --   --   --   HGB 13.2 13.7 13.3 13.1 13.4  HCT 43.7 44.2 41.9 41.2 42.0  MCV 89.2 87.2 85.5 84.6 84.5  PLT 275 288 294 297 327   Cardiac Enzymes: No results for input(s): CKTOTAL, CKMB, CKMBINDEX, TROPONINI in the last 168 hours. BNP: Invalid input(s): POCBNP CBG: Recent Labs  Lab 12/12/19 2316 12/13/19 0328 12/13/19 0807 12/13/19 1200 12/13/19 1519  GLUCAP 119* 143* 119* 140* 117*   D-Dimer No results for input(s): DDIMER in the last 72 hours. Hgb A1c No results for input(s): HGBA1C in the last 72 hours. Lipid Profile Recent Labs    12/13/19 0550  TRIG 294*   Thyroid function studies No results for input(s): TSH, T4TOTAL, T3FREE, THYROIDAB in the last 72 hours.  Invalid input(s): FREET3 Anemia work up No results for input(s): VITAMINB12, FOLATE, FERRITIN, TIBC, IRON, RETICCTPCT in the last 72 hours. Urinalysis    Component Value Date/Time   COLORURINE YELLOW 12/11/2019 1104   [place] 12/11/2019 1104   LABSPEC 1.017 12/11/2019 1104   PHURINE 6.0 12/11/2019 1104   [place] 12/11/2019 [place] 12/11/2019 [place] 12/11/2019 1104   KETONESUR 20 (A) 12/11/2019 [place] 12/11/2019 1104  NITRITE NEGATIVE 12/11/2019 1104   LEUKOCYTESUR NEGATIVE 12/11/2019 1104   Sepsis Labs Invalid input(s): PROCALCITONIN,  WBC,  LACTICIDVEN Microbiology Recent Results (from the past 240 hour(s))  SARS Coronavirus 2 by RT PCR (hospital order, performed in [facility] hospital lab) Nasopharyngeal Nasopharyngeal Swab     Status: None   Collection Time: 12/06/19  4:36 AM   Specimen: Nasopharyngeal Swab  Result Value Ref Range Status   SARS Coronavirus 2 NEGATIVE NEGATIVE Final    Comment: (NOTE) SARS-CoV-2 target nucleic acids are NOT DETECTED.  The SARS-CoV-2 RNA is generally detectable in upper and lower respiratory specimens during the acute phase of infection. The lowest concentration of SARS-CoV-2 viral copies this assay can detect is 250 copies / mL. A negative result does not preclude SARS-CoV-2 infection and should not be used as the sole basis for treatment or other patient management decisions.  A negative result may occur with improper specimen collection / handling, submission of specimen other than nasopharyngeal swab, presence of viral mutation(s) within the areas targeted by this assay, and inadequate number of viral copies (<250 copies / mL). A negative result must be combined with clinical observations, patient history, and epidemiological information.  Fact Sheet for Patients:   StrictlyIdeas.no  Fact Sheet for Healthcare Providers: BankingDealers.co.za  This test is not yet approved or  cleared by the [place] FDA and has been authorized for detection and/or diagnosis of SARS-CoV-2 by FDA under an Emergency Use Authorization (EUA).  This EUA will remain in effect (meaning this test can be used) for the duration of the COVID-19 declaration under Section 564(b)(1) of the Act, 21 U.S.C. section 360bbb-3(b)(1), unless  the authorization is terminated or revoked sooner.  Performed at [facility], [place] [address] [place], [place] [address]   MRSA PCR Screening     Status: None   Collection Time: 12/06/19  9:47 AM   Specimen: Nasal Mucosa; Nasopharyngeal  Result Value Ref Range Status   MRSA by PCR NEGATIVE NEGATIVE Final    Comment:        The GeneXpert MRSA Assay (FDA approved for NASAL specimens only), is one component of a comprehensive MRSA colonization surveillance program. It is not intended to diagnose MRSA infection nor to guide or monitor treatment for MRSA infections. Performed at [facility], [place] [address] [place], [place] [address]   Culture, respiratory     Status: None   Collection Time: 12/07/19 10:13 AM   Specimen: Bronchial Alveolar Lavage; Respiratory  Result Value Ref Range Status   Specimen Description Bronch Lavag  Final   Special Requests NONE  Final   Gram Stain   Final    RARE WBC PRESENT,BOTH PMN AND MONONUCLEAR NO ORGANISMS SEEN    Culture   Final    Consistent with normal respiratory flora. Performed at [facility], [place] [address], [place], [place] [address]    Report Status 12/09/2019 FINAL  Final  Culture, blood (Routine X 2) w Reflex to ID Panel     Status: None (Preliminary result)   Collection Time: 12/11/19 11:16 AM   Specimen: BLOOD  Result Value Ref Range Status   Specimen Description BLOOD RIGHT ANTECUBITAL  Final   Special Requests   Final    BOTTLES DRAWN AEROBIC AND ANAEROBIC Blood Culture adequate volume   Culture   Final    NO GROWTH 4 DAYS Performed at [facility], [place] [address], [place], [place] [address]  Report Status PENDING  Incomplete  Culture, blood (Routine X 2) w Reflex to ID Panel     Status: None (Preliminary result)   Collection Time: 12/11/19 11:23 AM   Specimen: BLOOD RIGHT HAND  Result Value Ref Range Status   Specimen Description BLOOD RIGHT HAND  Final   Special Requests   Final     BOTTLES DRAWN AEROBIC AND ANAEROBIC Blood Culture adequate volume   Culture   Final    NO GROWTH 4 DAYS Performed at [facility], [address] [place], [place] [address]    Report Status PENDING  Incomplete   Time spent: 32minm  SIGNED:   Marylu Lund, MD  [facility] 12/15/2019, 1:13 PM  If 7PM-7AM, please contact night-coverage

## 2019-12-15 NOTE — Progress Notes (Signed)
Dennis Arn, MD  Physician  Physical Medicine and Rehabilitation  PMR Pre-admission     Addendum  Date of Service:  12/14/2019  5:50 PM      Related encounter: ED to Hosp-Admission (Discharged) from 12/06/2019 in [facility]       Show:Clear all [x]Manual[x]Template[x]Copied  Added by: [x]Gentry, Claiborne Billings, OT  []Hover for details PMR Admission Coordinator Pre-Admission Assessment   Patient: Dennis Mercado is an 39 y.o., male MRN: [id number] DOB: February 23, 1981 Height: 5' 7" (170.2 cm) Weight: 74 kg                                                                                                                                                  Insurance Information HMO:     PPO: yes     PCP:      IPA:      80/20:      OTHER:  PRIMARY: [employer] [place]      Policy#: [id number]      Subscriber: patient CM Name: Larene Beach      Phone#: [phone]     Fax#: [phone] Pre-Cert#: [id number]      Employer:  Josem Kaufmann provided by Larene Beach for admit to CIR with effective dates 12/15/19 and clinical update due 12/28/19 to [place] (P); [phone] (f) [phone] Benefits:  Phone #: [phone]     Name:  Eff. Date: 05/18/2019-still active     Deduct: $5,000 ($4,623.28 met)      Out of Pocket Max: $6,600 (includes deductible - [phone] met)      Life Max: NA  CIR: $250 co-pay /admission; 70% co-insurance, 30% co-insurance      SNF: 70% coverage, 30% co-insurance; limited to 60 days/cal yr Outpatient: 50% coverage; 50% co-insurance; limited to 30 combined PT/OT visits and 30 ST visits     Home Health: 70% coverage, 30% co-insurance; limited by medical necessity review      DME: 100% coverage; 0% co-insurance     Providers:  SECONDARY: None      Policy#:       Phone#:    Development worker, community:       Phone#:    The Therapist, art Information Summary" for patients in Inpatient Rehabilitation Facilities with attached "Privacy Act Dustin Records" was provided and  verbally reviewed with: N/A   Emergency Contact Information Contact Information     Name Relation Home Work Mobile    Stuller,April Spouse     [phone]    Richarda Osmond Mother     [phone]    Vaughan, Garfinkle [phone]           Current Medical History  Patient Admitting Diagnosis: R thalamic intracranial hemorrhage   History of Present Illness: Oshea Percival is a 39 year old right-handed male with documented history of hypertension on no current antihypertensive medications.  History taken from  chart review and wife due to cognition.  Patient lives with spouse.  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  1 level home one-step to entry.  He presented on 12/06/2019 with left hemiparesis and AMS during sexual activity.  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  Per report, measuring 3.6 x 2.8 x 2.6 cm.  Hemorrhage extends into the ventricles and could be seen in the third and fourth ventricles without hydrocephalus.  Local mass-effect without midline shift.  CT angiogram of head and neck normal variant CTA circle of Willis without significant proximal stenosis aneurysm or branch vessel occlusion.  Admission chemistries potassium 3.2, creatinine 1.52, urine drug screen positive marijuana.  Echocardiogram with ejection fraction of 65%, no wall motion abnormalities.  EEG negative for seizures.  Carotid Dopplers unremarkable.  Venous Dopplers upper extremity negative for DVT.  Follow-up CT, personally reviewed, stable hemorrhage.  Unchanged size of right thalamic intraparenchymal hematoma with mild surrounding edema.  Hospital course further complicated by fevers presumed to be secondary to large left lung opacity concerning for pneumonia.  He completed course of Unasyn.  Dysphagia #2 thin liquids.  Cleviprex ongoing for blood pressure control since discontinued.  He was cleared to begin subcutaneous heparin for DVT prophylaxis 12/11/2019.  Therapy  evaluations completed and patient is to be admitted for a comprehensive rehab program on 12/15/19.    Complete NIHSS TOTAL: 10 Glasgow Coma Scale Score: 15   Past Medical History  No past medical history on file.   Family History  family history is not on file.   Prior Rehab/Hospitalizations:  Has the patient had prior rehab or hospitalizations prior to admission? No   Has the patient had major surgery during 100 days prior to admission? No   Current Medications    Current Facility-Administered Medications:  .  0.9 %  sodium chloride infusion, , Intravenous, PRN, Rosalin Hawking, MD, Paused at 12/14/19 1628 .  acetaminophen (TYLENOL) tablet 650 mg, 650 mg, Oral, Q4H PRN, 650 mg at 12/15/19 0809 **OR** acetaminophen (TYLENOL) 160 MG/5ML solution 650 mg, 650 mg, Per Tube, Q4H PRN, 650 mg at 12/10/19 1214 **OR** [DISCONTINUED] acetaminophen (TYLENOL) suppository 650 mg, 650 mg, Rectal, Q4H PRN, Kerney Elbe, MD, 650 mg at 12/11/19 0841 .  amLODipine (NORVASC) tablet 10 mg, 10 mg, Oral, Daily, Rosalin Hawking, MD, 10 mg at 12/15/19 1013 .  atorvastatin (LIPITOR) tablet 40 mg, 40 mg, Oral, Daily, Rosalin Hawking, MD .  carvedilol (COREG) tablet 25 mg, 25 mg, Oral, BID WC, Rosalin Hawking, MD, 25 mg at 12/15/19 0809 .  chlorhexidine gluconate (MEDLINE KIT) (PERIDEX) 0.12 % solution 15 mL, 15 mL, Mouth Rinse, BID, Minor, Grace Bushy, NP, 15 mL at 12/15/19 0810 .  Chlorhexidine Gluconate Cloth 2 % PADS 6 each, 6 each, Topical, Daily, Garvin Fila, MD, 6 each at 12/15/19 1015 .  cloNIDine (CATAPRES) tablet 0.3 mg, 0.3 mg, Oral, BID, Rosalin Hawking, MD, 0.3 mg at 12/15/19 1017 .  feeding supplement (ENSURE ENLIVE) (ENSURE ENLIVE) liquid 237 mL, 237 mL, Oral, TID BM, Rosalin Hawking, MD, 237 mL at 12/15/19 1015 .  haloperidol (HALDOL) tablet 2 mg, 2 mg, Oral, Q6H PRN, Icard, Bradley L, DO, 2 mg at 12/14/19 0145 .  heparin injection 5,000 Units, 5,000 Units, Subcutaneous, Q8H, Rosalin Hawking, MD, 5,000 Units at 12/15/19  1021 .  hydrALAZINE (APRESOLINE) injection 5-20 mg, 5-20 mg, Intravenous, Q4H PRN, Rosalin Hawking, MD, 20 mg at 12/14/19 1834 .  hydrALAZINE (APRESOLINE) tablet 100  mg, 100 mg, Oral, Q8H, Candee Furbish, MD, 100 mg at 12/15/19 0809 .  hydrocortisone (ANUSOL-HC) 2.5 % rectal cream 1 application, 1 application, Rectal, TID PRN, Merlene Laughter F, NP .  labetalol (NORMODYNE) injection 5-20 mg, 5-20 mg, Intravenous, Q2H PRN, Rosalin Hawking, MD, 20 mg at 12/14/19 2132 .  levETIRAcetam (KEPPRA) tablet 500 mg, 500 mg, Oral, BID, Rosalin Hawking, MD, 500 mg at 12/15/19 1011 .  lisinopril (ZESTRIL) tablet 10 mg, 10 mg, Oral, BID, Rosalin Hawking, MD, 10 mg at 12/15/19 1013 .  LORazepam (ATIVAN) injection 2 mg, 2 mg, Intravenous, Q5 min PRN, Bowser, Grace E, NP, 2 mg at 12/10/19 1519 .  MEDLINE mouth rinse, 15 mL, Mouth Rinse, BID, Rosalin Hawking, MD, 15 mL at 12/15/19 1015 .  melatonin tablet 3 mg, 3 mg, Oral, QHS, Raulkar, Clide Deutscher, MD, 3 mg at 12/14/19 2317 .  ondansetron (ZOFRAN-ODT) disintegrating tablet 4 mg, 4 mg, Oral, Q6H PRN, Icard, Bradley L, DO .  pantoprazole (PROTONIX) EC tablet 40 mg, 40 mg, Oral, Daily, Rosalin Hawking, MD, 40 mg at 12/15/19 1016 .  senna (SENOKOT) tablet 8.6 mg, 1 tablet, Oral, BID, Rosalin Hawking, MD, 8.6 mg at 12/15/19 1013 .  sodium chloride flush (NS) 0.9 % injection 10-40 mL, 10-40 mL, Intracatheter, Q12H, Minor, William S, NP, 10 mL at 12/15/19 1016 .  sodium chloride flush (NS) 0.9 % injection 10-40 mL, 10-40 mL, Intracatheter, PRN, Minor, Grace Bushy, NP   Patients Current Diet:     Diet Order                      DIET DYS 2 Room service appropriate? Yes with Assist; Fluid consistency: Thin  Diet effective now                      Precautions / Restrictions Precautions Precautions: Fall Precaution Comments: Lt hemiparesis (decreased sensation deficits on L UE and LE) Restrictions Weight Bearing Restrictions: No    Has the patient had 2 or more falls or a fall with injury in  the past year?No   Prior Activity Level Community (5-7x/wk): very active PTA, worked full time at [employer]; drove, recently married in May. Independent with No AD PTA   Prior Functional Level Prior Function Level of Independence: Independent Comments: Pt was fully independent.  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[employer]: Did the patient need help bathing, dressing, using the toilet or eating?  Independent   Indoor Mobility: Did the patient need assistance with walking from room to room (with or without device)? Independent   Stairs: Did the patient need assistance with internal or external stairs (with or without device)? Independent   Functional Cognition: Did the patient need help planning regular tasks such as shopping or remembering to take medications? Independent   Home Assistive Devices / Equipment Home Equipment: None   Prior Device Use: Indicate devices/aids used by the patient prior to current illness, exacerbation or injury? None of the above   Current Functional Level Cognition   Arousal/Alertness: Awake/alert Overall Cognitive Status: Impaired/Different from baseline Difficult to assess due to:  (ETT, lethargy) Orientation Level: Oriented X4 Safety/Judgement: Decreased awareness of safety, Decreased awareness of deficits General Comments: follows single step commands consistently with increased time; decreased awareness of safety/deficits and asking often "want me to get up now?" Attention: Sustained Sustained Attention: Impaired Sustained Attention Impairment: Verbal basic Memory: Impaired Memory Impairment: Decreased recall of new information  Awareness: Impaired Awareness Impairment: Intellectual impairment Problem Solving: Appears intact Problem Solving Impairment: Verbal basic    Extremity Assessment (includes Sensation/Coordination)   Upper Extremity Assessment: LUE deficits/detail, RUE deficits/detail RUE Deficits / Details: Pt with withdrawal to  pain, and minimal movement of Rt UE noted in response to stimulation, but family reports he has been actively moving Rt UE  LUE Deficits / Details: No spontaneous movement noted. PROM elbow distally WFL, and shoulder to ~90* - limited by ETT  LUE Coordination: decreased fine motor, decreased gross motor  Lower Extremity Assessment: Defer to PT evaluation RLE Deficits / Details: PROM WFL, pt with rigidity present initially but becomes more fluid with progressive PROM. No AROM noted in BLE LLE Deficits / Details: PROM WFL, pt with rigidity present initially but becomes more fluid with progressive PROM. No AROM noted in BLE     ADLs   Overall ADL's : Needs assistance/impaired Eating/Feeding: NPO Eating/Feeding Details (indicate cue type and reason): R handed Grooming: Total assistance, Bed level Upper Body Bathing: Total assistance, Bed level Lower Body Bathing: Total assistance, Bed level Upper Body Dressing : Total assistance, Bed level Lower Body Dressing: Total assistance, Bed level Toilet Transfer: Moderate assistance, +2 for physical assistance, +2 for safety/equipment (stand to stedy) Toilet Transfer Details (indicate cue type and reason): simulated via sit<>stand to stedy; MOD A +2 to power up. utilized Geologist, engineering for visual feedback to self correct balance Toileting- Clothing Manipulation and Hygiene: Total assistance, Bed level Functional mobility during ADLs: Moderate assistance, +2 for physical assistance, +2 for safety/equipment (sit<>stand to stedy) General ADL Comments: pt presents with balance impairments, L hemiparesis, and decreased awareness impacting pts ability to complete BADLs     Mobility   Overal bed mobility: Needs Assistance Bed Mobility: Rolling, Sidelying to Sit Rolling: Mod assist Sidelying to sit: Mod assist, +2 for physical assistance, +2 for safety/equipment Supine to sit: Max assist, +2 for physical assistance General bed mobility comments: cues for sequencing  and hand over hand assist to reach to L rail; assist to bring bilat LE from EOB and to elevate trunk into sitting      Transfers   Overall transfer level: Needs assistance Equipment used: Ambulation equipment used Transfer via Lift Equipment: Stedy Transfers: Sit to/from Stand Sit to Stand: Mod assist, +2 physical assistance, +2 safety/equipment Stand pivot transfers: Mod assist, Max assist, +2 physical assistance General transfer comment: assist to power up into standing with L UE supported by therapist     Ambulation / Gait / Stairs / Wheelchair Mobility   Ambulation/Gait General Gait Details: unable at this time     Posture / Balance Dynamic Sitting Balance Sitting balance - Comments: use of mirror for visual input as pt has poor proprioception; max directional cues to achieve and maintain midline, modA initially transitioning to minA for support Balance Overall balance assessment: Needs assistance Sitting-balance support: Feet supported, Single extremity supported Sitting balance-Leahy Scale: Poor Sitting balance - Comments: use of mirror for visual input as pt has poor proprioception; max directional cues to achieve and maintain midline, modA initially transitioning to minA for support Postural control: Left lateral lean, Posterior lean Standing balance support: Single extremity supported Standing balance-Leahy Scale: Poor Standing balance comment: worked on bilateral weight shifting and midline posture in standing with therapist maintaining L knee extension and supporting L UE     Special needs/care consideration Designated visitor wife: April        Previous Home Environment (from acute therapy documentation) Living Arrangements:  Spouse/significant other  Lives With: Spouse Available Help at Discharge: Family, Available 24 hours/day, Friend(s) Type of Home: House Home Layout: One level Home Access: Stairs to enter Entrance Stairs-Rails: None Entrance Stairs-Number of  Steps: 1 Bathroom Shower/Tub: Chiropodist: Standard Additional Comments: Mother and sister are both CNAs - sister works at [employer] on ?73M 7a-7p   Discharge Living Setting Plans for Discharge Living Setting: House, Lives with (comment) (wife) Type of Home at Discharge: House Discharge Home Layout: One level Discharge Home Access: Stairs to enter Entrance Stairs-Rails: None Entrance Stairs-Number of Steps: 1 step + threshold Discharge Bathroom Shower/Tub: Tub/shower unit Discharge Bathroom Toilet: Handicapped height Discharge Bathroom Accessibility: Yes How Accessible: Accessible via walker (if sideways) Does the patient have any problems obtaining your medications?: No   Social/Family/Support Systems Patient Roles: Spouse Contact Information: wife: April ([phone]) Anticipated Caregiver: wife April + other family members (like his mom Santiago Glad) Anticipated Caregiver's Contact Information: see above Ability/Limitations of Caregiver: Min/Mod A  Caregiver Availability: 24/7 Discharge Plan Discussed with Primary Caregiver: Yes (pt and his wife) Is Caregiver In Agreement with Plan?: Yes Does Caregiver/Family have Issues with Lodging/Transportation while Pt is in Rehab?: No     Goals Patient/Family Goal for Rehab: PT/OT: Min/Mod A; SLP: supervision Expected length of stay: 18-22 days Pt/Family Agrees to Admission and willing to participate: Yes Program Orientation Provided & Reviewed with Pt/Caregiver Including Roles  & Responsibilities: Yes (pt and his wife)  Barriers to Discharge: Home environment access/layout  Barriers to Discharge Comments: steps to enter home     Decrease burden of Care through IP rehab admission: NA     Possible need for SNF placement upon discharge:Not anticipated; pt has great family support and given his age, PLOF, and motivation feel he is an excellent candidate for CIR. Anticipate pt can reach anticipated goals through CIR and can DC  straight home with support in place.      Patient Condition: This patient's medical and functional status has changed since the consult dated: 12/13/19 in which the Rehabilitation Physician determined and documented that the patient's condition is appropriate for intensive rehabilitative care in an inpatient rehabilitation facility. Medical changes are: see H&P for details.  Functional changes are: improvement in functional bed mobility from total A to Mod A +2, improvement in transfers from unbale to attempt to Mod A +2  And improvement in ADLs from total A bed level to Mod A +2. After evaluating the patient today and speaking with the Rehabilitation physician and acute team, the patient remains appropriate for inpatient rehab. Will admit to inpatient rehab today.   Preadmission Screen Completed By:  Raechel Ache, OT, 12/15/2019 1:59 PM ______________________________________________________________________   Discussed status with Dr. Posey Pronto on 12/15/19 at 1:59PM and received approval for admission today.   Admission Coordinator:  Raechel Ache, time 1:59PM/Date 12/15/19.          Revision History                          Note Details  Author Dennis Arn, MD File Time 12/15/2019  2:05 PM  Author Type Physician Status Addendum  Last Editor Dennis Arn, MD Service Physical Medicine and [place] # [id number] Admit Date 12/15/2019

## 2019-12-15 NOTE — Progress Notes (Signed)
STROKE TEAM PROGRESS NOTE   INTERVAL HISTORY Completed FMLA papers for wife yesterday and delivered to [facility], who gave to wife.   Wife at bedside today, pt is working with PT/OT in room. Pt is standing with assistance from PT/OT. Pt is in good spirit but still has left sided weakness.    Vitals:   12/14/19 2048 12/14/19 2308 12/15/19 0453 12/15/19 0919  BP: (!) 150/84 (!) 164/115 (!) 107/58 (!) 141/105  Pulse:  78 79 72  Resp: (!) 22 (!) _0 Temp:  98.3 F (36.8 C) 97.8 F (36.6 C) 99.6 F (37.6 C)  TempSrc:  Oral Oral Axillary  SpO2:  98% 100% 99%  Weight:      Height:       CBC:  Recent Labs  Lab 12/10/19 0423 12/12/19 0500 12/14/19 0649 12/15/19 0408  WBC 11.4*   < > 10.0 11.6*  NEUTROABS 9.0*  --   --   --   HGB 13.2   < > 13.1 13.4  HCT 43.7   < > 41.2 42.0  MCV 89.2   < > 84.6 84.5  PLT 275   < > 297 327   < > = values in this interval not displayed.   Basic Metabolic Panel:  Recent Labs  Lab 12/10/19 0423 12/10/19 1020 12/14/19 0649 12/15/19 0408  NA 155*   < > 141 140  K 3.7   < > 3.5 3.6  CL 119*   < > 111 110  CO2 26   < > 17* 21*  GLUCOSE 94   < > 166* 126*  BUN 22*   < > 22* 19  CREATININE 1.51*   < > 1.38* 1.37*  CALCIUM 8.7*   < > 8.5* 8.8*  MG 2.1  --  2.2 2.2  PHOS 3.1  --  2.6  --    < > = values in this interval not displayed.   Lipid Panel:     Component Value Date/Time   CHOL 232 (H) 12/06/2019 1203   TRIG 294 (H) 12/13/2019 0550   HDL 42 12/06/2019 1203   CHOLHDL 5.5 12/06/2019 1203   VLDL UNABLE TO CALCULATE IF TRIGLYCERIDE OVER 400 mg/dL 12/06/2019 1203   LDLCALC UNABLE TO CALCULATE IF TRIGLYCERIDE OVER 400 mg/dL 12/06/2019 1203   HgbA1c:  Lab Results  Component Value Date   HGBA1C 5.6 12/06/2019   Urine Drug Screen:     Component Value Date/Time   LABOPIA NONE DETECTED 12/11/2019 1103   COCAINSCRNUR NONE DETECTED 12/11/2019 1103   LABBENZ POSITIVE (A) 12/11/2019 1103   AMPHETMU NONE DETECTED 12/11/2019 1103    THCU NONE DETECTED 12/11/2019 1103   LABBARB NONE DETECTED 12/11/2019 1103    Alcohol Level     Component Value Date/Time   ETH <10 12/06/2019 1203     IMAGING past 24 hours No results found.   PHYSICAL EXAM  Temp:  [97.8 F (36.6 C)-99.6 F (37.6 C)] 99.6 F (37.6 C) (06/30 0919) Pulse Rate:  [72-98] 72 (06/30 0919) Resp:  [20-25] 20 (06/30 0919) BP: (107-171)/(58-115) 141/105 (06/30 0919) SpO2:  [98 %-100 %] 99 % (06/30 0919)  General - Well nourished, well developed, not in distress, in sleep.  Ophthalmologic - fundi not visualized due to noncooperation.  Cardiovascular - Regular rhythm and rate.  Neuro - in sleep but arousable, orientated x 4. No aphasia, following commands, able to repeat and name. Right gaze preference but able to cross midline, left hemianopia.  Left facial droop. Tongue midline. Left UE 0/5 and LLE 1/5 proximal, 2-/5 knee extension, 0/5 distal. Right UE and LE at least 4/5. DTR 1+ and no babinski. Sensation subjectively symmetrical. Right FTN intact. Increased muscle tone LUE and LLE. Gait not tested.    ASSESSMENT/PLAN [name] is a 39 y.o. male with recent HTN at dentist office with plans for PCP visit next week, presenting post coitus with L sided weakness, elevated BP.   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 IVH. Local mass effect w/o midline shift  CTA head Unremarkable   CT head at 12h stable  CT head 6/22 0440 stable R thalamic hemorrhage, IVH. Sllight increased in ventricle size. 4-5 midline shift  CT head 6/22 remains stable     MRI 6/23 - Unchanged size of right thalamic ICH with mild surrounding edema. No hydrocephalus or ventricular entrapment.   CT Head 6/26 - No substantial change  EEG - severe diffuse encephalopathy  Carotid Doppler - unremarkable  2D Echo EF 60-65%. No source of embolus    LDL 157  HgbA1c 5.6   Heparin subq for VTE prophylaxis  No  antithrombotic prior to admission, now on No antithrombotic given hemorrhage   Therapy recommendations: CIR   Disposition:  pending   Cerebral Edema   On 3% protocol -> now off   NA 151-153-151-150->155->158->154->148->141->140  Repeat CT 6/26 stable   MRI unchanged size with mild edema  Hypoxic Respiratory Failure Aspiration PNA Fever, resolved leukocytosis  Emergently intubated 6/22 -> extubated 6/25  CT chest L consolidation   Bronchoscopy, BAL to LLL  Tmax - 101.8->100.2 ->101.9->101.7->100->afebrile  WBC - 20.6->12.8->15.7->11.4->11.8->9.7->10.0->11.6  Unasyn 6/22>> 6/29   CXR - 6/23 - Stable large left lung opacity concerning for pneumonia. Improved right basilar opacity is noted  CXR 6/26 Significant improvement compared to the prior study with near resolution of the previously seen hazy left lung airspace opacity  Hypertensive Emergency   BP as high as 219/162   SBP goal < 160  BP improving  Now off cleviprex . Now on Coreg 25 mg Bid ; Norvasc 10 mg QD ; Hydralazine 100 mg Q8 hrs, clonidine 0.2->0.3 bid, lisinopril 10->bid . Long-term BP goal normotensive   Seizure   Agitation following extubation 6/25  L trapezius muscle contractions, extending to R trapezius, with subsequent Upper and lower extremity rigidity with rhythmic contraction. Pt unresponsive, upward deviated gaze. C/w seizure   S/p ativan  On depakote 500 Q6h -> 750 Q8h -> off  Now on keppra  EEG severe diffuse encephalopathy  Depakote level - 39     Hyperlipidemia  Home meds: none   LDL 157  On lipitor 40   Continue statin on discharge  Dysphagia . Secondary to stroke . Speech on board . Pt removed cortrak -> TF stopped  . Now on dys2 and thin liquid . Ensure Enlive tid  CKD stage IIIa  Creatinine 1.4-1.57-1.74->1.52->1.45->1.38->1.37  Encourage po intake  BMP monitoring  Other Stroke Risk Factors  ETOH use, advised to drink no more than 2 drink(s) a  day  Substance abuse - UDS:  THC Positive. Patient will be advised to stop using due to stroke risk.  Other Active Problems  Hypokalemia 3.2-2.9-4.1->3.7 ->3.0->3.6->2.9->3.5->3.6  Melatonin added for sleep  Hospital day # 9  Neurology will sign off. Please call with questions. [facility] Thanks for  the consult.   Rosalin Hawking, MD PhD Stroke Neurology 12/15/2019 11:05 AM    To contact Stroke Continuity provider, please refer to [email or web address] After hours, contact General Neurology

## 2019-12-15 NOTE — TOC Transition Note (Signed)
Transition of Care [facility]) - CM/SW Discharge Note   Patient Details  Name: Aodhan Scheidt MRN: [id number] Date of Birth: 1980-12-20  Transition of Care [facility]) CM/SW Contact:  Pollie Friar, RN Phone Number: 12/15/2019, 2:24 PM   Clinical Narrative:    Pt is discharging to CIR today. CM signing off.    Final next level of care: IP Rehab Facility Barriers to Discharge: No Barriers Identified   Patient Goals and CMS Choice        Discharge Placement                       Discharge Plan and Services                                     Social Determinants of Health (SDOH) Interventions     Readmission Risk Interventions No flowsheet data found.

## 2019-12-15 NOTE — Progress Notes (Signed)
Inpatient Rehabilitation-Admissions Coordinator   I have received insurance approval and medical clearance for admit to CIR today. Notified pt and his wife of bed offer and they have accepted. Reviewed insurance benefits letter and consent forms with pt and his wife. All questions answered.   RN and [facility] team notified of plan for today.   Raechel Ache, OTR/L  Rehab Admissions Coordinator  [phone] 12/15/2019 1:46 PM

## 2019-12-15 NOTE — H&P (Signed)
Physical Medicine and Rehabilitation Admission H&P    Chief Complaint  Patient presents with  . Code Stroke  : HPI: Dennis Mercado is a 39 year old right-handed male with documented history of hypertension on no current antihypertensive medications.  History taken from chart review and wife due to cognition.  Patient lives with spouse.  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  1 level home one-step to entry.  He presented on 12/06/2019 with left hemiparesis and AMS during sexual activity.  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  Per report, measuring 3.6 x 2.8 x 2.6 cm.  Hemorrhage extends into the ventricles and could be seen in the third and fourth ventricles without hydrocephalus.  Local mass-effect without midline shift.  CT angiogram of head and neck normal variant CTA circle of Willis without significant proximal stenosis aneurysm or branch vessel occlusion.  Admission chemistries potassium 3.2, creatinine 1.52, urine drug screen positive marijuana.  Echocardiogram with ejection fraction of 65%, no wall motion abnormalities.  EEG negative for seizures.  Carotid Dopplers unremarkable.  Venous Dopplers upper extremity negative for DVT.  Follow-up CT, personally reviewed, stable hemorrhage.  Unchanged size of right thalamic intraparenchymal hematoma with mild surrounding edema.  Hospital course further complicated by fevers presumed to be secondary to large left lung opacity concerning for pneumonia.  He completed course of Unasyn.  Dysphagia #2 thin liquids.  Cleviprex ongoing for blood pressure control since discontinued.  He was cleared to begin subcutaneous heparin for DVT prophylaxis 12/11/2019.  Therapy evaluations completed and patient was admitted for a comprehensive rehab program.  Please see preadmission assessment from earlier today as well.  Review of Systems  Unable to perform ROS: Other  ?  Reliability due to cognition  No past medical  history on file.,  Unable to accurately obtain from patient No past surgical history on file.,  Unable to accurately obtain from patient No family history on file.,  Unable to accurately obtain from patient Social History:  reports that he has never smoked. He has never used smokeless tobacco. He reports current alcohol use. He reports that he does not use drugs. Allergies: No Known Allergies No medications prior to admission.   Drug Regimen Review Drug regimen was reviewed and remains appropriate with no significant issues identified  Home: Home Living Family/patient expects to be discharged to:: Private residence Living Arrangements: Spouse/significant other Available Help at Discharge: Family, Available 24 hours/day, Friend(s) Type of Home: House Home Access: Stairs to enter [employer] of Steps: 1 Entrance Stairs-Rails: None Home Layout: One level Bathroom Shower/Tub: Chiropodist: Standard Home Equipment: None Additional Comments: Mother and sister are both CNAs - sister works at [employer] on ?35M 7a-7p  Lives With: Spouse   Functional History: Prior Function Level of Independence: Independent Comments: Pt was fully independent.  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[employer]:  Mobility: Bed Mobility Overal bed mobility: Needs Assistance Bed Mobility: Rolling, Sidelying to Sit Rolling: Mod assist Sidelying to sit: Mod assist, +2 for physical assistance, +2 for safety/equipment Supine to sit: Max assist, +2 for physical assistance General bed mobility comments: cues for sequencing and hand over hand assist to reach to L rail; assist to bring bilat LE from EOB and to elevate trunk into sitting  Transfers Overall transfer level: Needs assistance Equipment used: Ambulation equipment used Transfer via Lift Equipment: Stedy Transfers: Sit to/from Stand Sit to Stand: Mod assist, +2 physical assistance, +2  safety/equipment Stand pivot  transfers: Mod assist, Max assist, +2 physical assistance General transfer comment: assist to power up into standing with L UE supported by therapist Ambulation/Gait General Gait Details: unable at this time    ADL: ADL Overall ADL's : Needs assistance/impaired Eating/Feeding: NPO Eating/Feeding Details (indicate cue type and reason): R handed Grooming: Total assistance, Bed level Upper Body Bathing: Total assistance, Bed level Lower Body Bathing: Total assistance, Bed level Upper Body Dressing : Total assistance, Bed level Lower Body Dressing: Total assistance, Bed level Toilet Transfer: Moderate assistance, +2 for physical assistance, +2 for safety/equipment (stand to stedy) Toilet Transfer Details (indicate cue type and reason): simulated via sit<>stand to stedy; MOD A +2 to power up. utilized Geologist, engineering for visual feedback to self correct balance Toileting- Clothing Manipulation and Hygiene: Total assistance, Bed level Functional mobility during ADLs: Moderate assistance, +2 for physical assistance, +2 for safety/equipment (sit<>stand to stedy) General ADL Comments: pt presents with balance impairments, L hemiparesis, and decreased awareness impacting pts ability to complete BADLs  Cognition: Cognition Overall Cognitive Status: Impaired/Different from baseline Arousal/Alertness: Awake/alert Orientation Level: Oriented to person Attention: Sustained Sustained Attention: Impaired Sustained Attention Impairment: Verbal basic Memory: Impaired Memory Impairment: Decreased recall of new information Awareness: Impaired Awareness Impairment: Intellectual impairment Problem Solving: Appears intact Problem Solving Impairment: Verbal basic Cognition Arousal/Alertness: Awake/alert Behavior During Therapy: Flat affect, WFL for tasks assessed/performed Overall Cognitive Status: Impaired/Different from baseline Area of Impairment: Problem solving, Awareness,  Safety/judgement Safety/Judgement: Decreased awareness of safety, Decreased awareness of deficits Awareness: Emergent (able to state balance impairments and initiate correcting but can't maintain corrections) Problem Solving: Decreased initiation, Difficulty sequencing, Requires verbal cues, Requires tactile cues General Comments: follows single step commands consistently with increased time; decreased awareness of safety/deficits and asking often "want me to get up now?" Difficult to assess due to:  (ETT, lethargy)  Physical Exam: Blood pressure (!) 107/58, pulse 79, temperature 97.8 F (36.6 C), temperature source Oral, resp. rate 20, height 5\' 7"  (1.702 m), weight 74 kg, SpO2 100 %. Physical Exam Vitals reviewed.  Constitutional:      General: He is not in acute distress. HENT:     Head: Normocephalic and atraumatic.     Right Ear: External ear normal.     Left Ear: External ear normal.     Nose: Nose normal.  Eyes:     General:        Right eye: No discharge.        Left eye: No discharge.     Extraocular Movements: Extraocular movements intact.  Pulmonary:     Effort: Pulmonary effort is normal. No respiratory distress.     Breath sounds: No stridor.  Abdominal:     General: There is no distension.     Palpations: Abdomen is soft.  Musculoskeletal:     Cervical back: Tenderness present.     Comments: No edema or tenderness in extremities  Skin:    General: Skin is warm and dry.  Neurological:     Mental Status: He is alert and oriented to person, place, and time.     Comments: Slightly lethargic  Left facial droop Left lean Follows simple commands.   Display some decreased awareness of deficits. Motor: RUE/RLE: 5/5 proximal distal LUE/LLE: 0/5 proximal distal Sensation diminished to light touch left side  Psychiatric:        Mood and Affect: Affect is blunt and flat.        Speech: Speech is delayed.  Behavior: Behavior is slowed.        Cognition and  Memory: Cognition is impaired. Memory is impaired.     Results for orders placed or performed during the hospital encounter of 12/06/19 (from the past 48 hour(s))  Glucose, capillary     Status: Abnormal   Collection Time: 12/13/19  8:07 AM  Result Value Ref Range   Glucose-Capillary 119 (H) 70 - 99 mg/dL    Comment: Glucose reference range applies only to samples taken after fasting for at least 8 hours.  Glucose, capillary     Status: Abnormal   Collection Time: 12/13/19 12:00 PM  Result Value Ref Range   Glucose-Capillary 140 (H) 70 - 99 mg/dL    Comment: Glucose reference range applies only to samples taken after fasting for at least 8 hours.  Glucose, capillary     Status: Abnormal   Collection Time: 12/13/19  3:19 PM  Result Value Ref Range   Glucose-Capillary 117 (H) 70 - 99 mg/dL    Comment: Glucose reference range applies only to samples taken after fasting for at least 8 hours.  CBC     Status: None   Collection Time: 12/14/19  6:49 AM  Result Value Ref Range   WBC 10.0 4.0 - 10.5 K/uL   RBC 4.87 4.22 - 5.81 MIL/uL   Hemoglobin 13.1 13.0 - 17.0 g/dL   HCT 41.2 39 - 52 %   MCV 84.6 80.0 - 100.0 fL   MCH 26.9 26.0 - 34.0 pg   MCHC 31.8 30.0 - 36.0 g/dL   RDW 14.7 11.5 - 15.5 %   Platelets 297 150 - 400 K/uL   nRBC 0.2 0.0 - 0.2 %    Comment: Performed at [facility], [place] [address], [place], [place] [address]  Basic metabolic panel     Status: Abnormal   Collection Time: 12/14/19  6:49 AM  Result Value Ref Range   Sodium 141 135 - 145 mmol/L   Potassium 3.5 3.5 - 5.1 mmol/L    Comment: SLIGHT HEMOLYSIS   Chloride 111 98 - 111 mmol/L   CO2 17 (L) 22 - 32 mmol/L   Glucose, Bld 166 (H) 70 - 99 mg/dL    Comment: Glucose reference range applies only to samples taken after fasting for at least 8 hours.   BUN 22 (H) 6 - 20 mg/dL   Creatinine, Ser 1.38 (H) 0.61 - 1.24 mg/dL   Calcium 8.5 (L) 8.9 - 10.3 mg/dL   GFR calc non Af Amer >60 >60 mL/min   GFR calc Af  Amer >60 >60 mL/min   Anion gap 13 5 - 15    Comment: Performed at [facility] [address] [place], [place] [address]  Magnesium     Status: None   Collection Time: 12/14/19  6:49 AM  Result Value Ref Range   Magnesium 2.2 1.7 - 2.4 mg/dL    Comment: Performed at [facility] [address], [place], [place] [address]  Albumin     Status: Abnormal   Collection Time: 12/14/19  6:49 AM  Result Value Ref Range   Albumin 2.9 (L) 3.5 - 5.0 g/dL    Comment: Performed at [facility] [address], [place], [place] [address]  Phosphorus     Status: None   Collection Time: 12/14/19  6:49 AM  Result Value Ref Range   Phosphorus 2.6 2.5 - 4.6 mg/dL    Comment: SLIGHT  HEMOLYSIS Performed at [facility], [place] [address] [place], [place] [address]   Sodium, urine, random     Status: None   Collection Time: 12/14/19  6:30 PM  Result Value Ref Range   Sodium, Ur 136 mmol/L    Comment: Performed at [facility] [address] [place], [place] [address]  CBC     Status: Abnormal   Collection Time: 12/15/19  4:08 AM  Result Value Ref Range   WBC 11.6 (H) 4.0 - 10.5 K/uL   RBC 4.97 4.22 - 5.81 MIL/uL   Hemoglobin 13.4 13.0 - 17.0 g/dL   HCT 42.0 39 - 52 %   MCV 84.5 80.0 - 100.0 fL   MCH 27.0 26.0 - 34.0 pg   MCHC 31.9 30.0 - 36.0 g/dL   RDW 14.7 11.5 - 15.5 %   Platelets 327 150 - 400 K/uL   nRBC 0.0 0.0 - 0.2 %    Comment: Performed at [facility], [place] [address] [place], [place] [address]  Basic metabolic panel     Status: Abnormal   Collection Time: 12/15/19  4:08 AM  Result Value Ref Range   Sodium 140 135 - 145 mmol/L   Potassium 3.6 3.5 - 5.1 mmol/L   Chloride 110 98 - 111 mmol/L   CO2 21 (L) 22 - 32 mmol/L   Glucose, Bld 126 (H) 70 - 99 mg/dL    Comment: Glucose reference range applies only to samples taken after fasting for at least 8 hours.   BUN 19 6 - 20 mg/dL   Creatinine, Ser 1.37 (H) 0.61 - 1.24 mg/dL   Calcium 8.8 (L)  8.9 - 10.3 mg/dL   GFR calc non Af Amer >60 >60 mL/min   GFR calc Af Amer >60 >60 mL/min   Anion gap 9 5 - 15    Comment: Performed at [facility] [address], [place], [place] [address]  Magnesium     Status: None   Collection Time: 12/15/19  4:08 AM  Result Value Ref Range   Magnesium 2.2 1.7 - 2.4 mg/dL    Comment: Performed at [facility] [address], [place],  [address]   No results found.     Medical Problem List and Plan: 1.  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  Antithrombotics: -DVT/anticoagulation: Subcutaneous heparin initiated 12/11/2019  -antiplatelet therapy: N/A 3. Pain Management: Tylenol as needed 4. Mood: Provide emotional support  -antipsychotic agents: N/A 5. Neuropsych: This patient is?  Fully capable of making decisions on his own behalf. 6. Skin/Wound Care: Routine skin checks 7. Fluids/Electrolytes/Nutrition: Routine in and outs.  CMP ordered. 8.  Post stroke dysphagia.  Dysphagia #2 thin liquids.  Follow-up speech therapy.  Advance diet as tolerated 9.  Left lung pneumonia.  Intravenous Unasyn completed 10.  Hypertension.  Norvasc 10 mg daily, Coreg 25 mg twice daily, clonidine 0.3 mg twice daily, hydralazine 100 mg every 8 hours, lisinopril 10 mg twice daily.    Monitor with increased mobility 11.  Urine drug screen positive marijuana.  Counseled on appropriate  Cathlyn Parsons, PA-C 12/15/2019  I have personally performed a face to face diagnostic evaluation, including, but not limited to relevant history and physical exam findings, of this patient and developed relevant assessment and plan.  Additionally, I have  reviewed and concur with the physician assistant's documentation above.  Delice Lesch, MD, ABPMR  The patient's status has not changed. The original post admission  physician evaluation remains appropriate, and any changes from the pre-admission screening or documentation from the acute chart are noted above.   Delice Lesch, MD, ABPMR

## 2019-12-16 ENCOUNTER — Inpatient Hospital Stay (HOSPITAL_COMMUNITY): Payer: BC Managed Care – PPO | Admitting: Speech Pathology

## 2019-12-16 ENCOUNTER — Inpatient Hospital Stay (HOSPITAL_COMMUNITY): Payer: BC Managed Care – PPO | Admitting: Physical Therapy

## 2019-12-16 ENCOUNTER — Inpatient Hospital Stay (HOSPITAL_COMMUNITY): Payer: BC Managed Care – PPO | Admitting: Occupational Therapy

## 2019-12-16 ENCOUNTER — Inpatient Hospital Stay (HOSPITAL_COMMUNITY): Payer: BC Managed Care – PPO

## 2019-12-16 DIAGNOSIS — I619 Nontraumatic intracerebral hemorrhage, unspecified: Secondary | ICD-10-CM

## 2019-12-16 LAB — COMPREHENSIVE METABOLIC PANEL
ALT: 90 U/L — ABNORMAL HIGH (ref 0–44)
AST: 44 U/L — ABNORMAL HIGH (ref 15–41)
Albumin: 2.9 g/dL — ABNORMAL LOW (ref 3.5–5.0)
Alkaline Phosphatase: 41 U/L (ref 38–126)
Anion gap: 7 (ref 5–15)
BUN: 28 mg/dL — ABNORMAL HIGH (ref 6–20)
CO2: 22 mmol/L (ref 22–32)
Calcium: 8.8 mg/dL — ABNORMAL LOW (ref 8.9–10.3)
Chloride: 112 mmol/L — ABNORMAL HIGH (ref 98–111)
Creatinine, Ser: 1.57 mg/dL — ABNORMAL HIGH (ref 0.61–1.24)
GFR calc Af Amer: >60 mL/min (ref >60)
GFR calc non Af Amer: 55 mL/min — ABNORMAL LOW (ref >60)
Glucose, Bld: 134 mg/dL — ABNORMAL HIGH (ref 70–99)
Potassium: 3.5 mmol/L (ref 3.5–5.1)
Sodium: 141 mmol/L (ref 135–145)
Total Bilirubin: 0.9 mg/dL (ref 0.3–1.2)
Total Protein: 5.8 g/dL — ABNORMAL LOW (ref 6.5–8.1)

## 2019-12-16 LAB — CULTURE, BLOOD (ROUTINE X 2)
Culture: NO GROWTH
Culture: NO GROWTH
Special Requests: ADEQUATE
Special Requests: ADEQUATE

## 2019-12-16 LAB — CBC WITH DIFFERENTIAL/PLATELET
Abs Immature Granulocytes: 0.15 10*3/uL — ABNORMAL HIGH (ref 0.00–0.07)
Basophils Absolute: 0.1 10*3/uL (ref 0.0–0.1)
Basophils Relative: 0 %
Eosinophils Absolute: 0.1 10*3/uL (ref 0.0–0.5)
Eosinophils Relative: 1 %
HCT: 39 % (ref 39.0–52.0)
Hemoglobin: 12.4 g/dL — ABNORMAL LOW (ref 13.0–17.0)
Immature Granulocytes: 1 %
Lymphocytes Relative: 15 %
Lymphs Abs: 2 10*3/uL (ref 0.7–4.0)
MCH: 26.8 pg (ref 26.0–34.0)
MCHC: 31.8 g/dL (ref 30.0–36.0)
MCV: 84.4 fL (ref 80.0–100.0)
Monocytes Absolute: 1.1 10*3/uL — ABNORMAL HIGH (ref 0.1–1.0)
Monocytes Relative: 9 %
Neutro Abs: 9.4 10*3/uL — ABNORMAL HIGH (ref 1.7–7.7)
Neutrophils Relative %: 74 %
Platelets: 330 10*3/uL (ref 150–400)
RBC: 4.62 MIL/uL (ref 4.22–5.81)
RDW: 14.7 % (ref 11.5–15.5)
WBC: 12.7 10*3/uL — ABNORMAL HIGH (ref 4.0–10.5)
nRBC: 0 % (ref 0.0–0.2)

## 2019-12-16 MED ORDER — BLOOD PRESSURE CONTROL BOOK
Freq: Once | Status: AC
  Filled 2019-12-16: qty 1

## 2019-12-16 MED ORDER — SODIUM CHLORIDE 0.45 % IV BOLUS
250.0000 mL | Freq: Once | INTRAVENOUS | Status: AC
  Administered 2019-12-16: 250 mL via INTRAVENOUS

## 2019-12-16 NOTE — Evaluation (Signed)
Speech Language Pathology Assessment and Plan  Patient Details  Name: Dennis Mercado MRN: [id number] Date of Birth: 05-06-1981  SLP Diagnosis: Cognitive Impairments;Dysphagia  Rehab Potential: Good ELOS: 3-4 weeks    Today's Date: 12/16/2019 SLP Individual Time: 6761-9509 SLP Individual Time Calculation (min): 30 min   Hospital Problem: Active Problems:   ICH (intracerebral hemorrhage) ([place])  Past Medical History: History reviewed. No pertinent past medical history. Past Surgical History: History reviewed. No pertinent surgical history.  Assessment / Plan / Recommendation Clinical Impression   HPI: Dennis Mercado is a 39 year old right-handed male with documented history of hypertension on no current antihypertensive medications.  History taken from chart review and wife due to cognition.  Patient lives with spouse.  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  1 level home one-step to entry.  He presented on 12/06/2019 with left hemiparesis and AMS during sexual activity.  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  Per report, measuring 3.6 x 2.8 x 2.6 cm.  Hemorrhage extends into the ventricles and could be seen in the third and fourth ventricles without hydrocephalus.  Local mass-effect without midline shift.  CT angiogram of head and neck normal variant CTA circle of Willis without significant proximal stenosis aneurysm or branch vessel occlusion.  Admission chemistries potassium 3.2, creatinine 1.52, urine drug screen positive marijuana.  Echocardiogram with ejection fraction of 65%, no wall motion abnormalities.  EEG negative for seizures.  Carotid Dopplers unremarkable.  Venous Dopplers upper extremity negative for DVT.  Follow-up CT, personally reviewed, stable hemorrhage.  Unchanged size of right thalamic intraparenchymal hematoma with mild surrounding edema.  Hospital course further complicated by fevers presumed to be secondary to large left lung  opacity concerning for pneumonia.  He completed course of Unasyn.  Dysphagia #2 thin liquids.  Cleviprex ongoing for blood pressure control since discontinued.  He was cleared to begin subcutaneous heparin for DVT prophylaxis 12/11/2019.  Therapy evaluations completed and patient was admitted for a comprehensive rehab program 12/15/19 and SLP evaluations were completed 12/16/19 with results as follows:  Bedside swallow eval: Pt presents with oral dysphagia s/p left facial weakness and reduced sensation, further exacerbated by pt's cognitive status (reduced attention, problem solving, and awareness). Mild left anterior loss of solids and liquids, as well as moderate left buccal pocketing of solids observed. Pt required moderate cues to clear left buccal cavity with use of lingual sweep and liquid washes. Dysphagia 2 (minced/finely chopped) solids were masticated more efficiently than Dysphagia 3 (mech soft) solids; Dys 3 also resulted in an increase in left buccal pocketing. No overt s/sx aspiration noted across textures. Recommend continue Dys 2, thin liquids, full supervision to ensure use of swallow strategies and sustained attention to intake.   Cognitive-linguistic eval: Pt presents with moderate cognitive deficits characterized by reduced selective attention, basic to mildly complex problem solving, left visual inattention, short term memory, and emergent awareness deficits. Pt oriented X4. He scored 18/30 on MOCA version 7.1 (WFL 26 or >). He did recall hospital safety precautions and demonstrate ability to use call bell to request assistance, however per other staff pt has been impulsive at times and with decreased functional safety awareness. Pt with intermittent language of confusion, however receptive/expressive language WFL and speech was 100% intelligible.  Recommend pt receive skilled ST services while inpatient to address dysphaiga and cognitive impairments detailed above in order to ensure diet  safety and efficient and maximize his functional independence and safety prior to discharge.  Skilled Therapeutic Interventions          Bedside swallow and cognitive-linguistic evaluations were administered and results were reviewed with pt (please see above for details regarding results).   SLP Assessment  Patient will need skilled Speech Lanaguage Pathology Services during CIR admission    Recommendations  SLP Diet Recommendations: Dysphagia 2 (Fine chop);Thin Liquid Administration via: Cup;Straw Medication Administration: Whole meds with liquid Supervision: Patient able to self feed;Full supervision/cueing for compensatory strategies Compensations: Slow rate;Small sips/bites;Lingual sweep for clearance of pocketing Postural Changes and/or Swallow Maneuvers: Seated upright 90 degrees Oral Care Recommendations: Oral care BID Recommendations for Other Services: Neuropsych consult Patient destination: Home Follow up Recommendations: Outpatient SLP;24 hour supervision/assistance;Home Health SLP Equipment Recommended: None recommended by SLP    SLP Frequency 3 to 5 out of 7 days   SLP Duration  SLP Intensity  SLP Treatment/Interventions 3-4 weeks  Minumum of 1-2 x/day, 30 to 90 minutes  Cognitive remediation/compensation;Cueing hierarchy;Dysphagia/aspiration precaution training;Functional tasks;Patient/family education;Therapeutic Activities;Internal/external aids;Environmental controls    Pain Pain Assessment Pain Scale: 0-10 Pain Score: 8  Pain Type: Acute pain Pain Location: Neck Pain Orientation: Mid Pain Descriptors / Indicators: Aching Pain Onset: On-going Pain Intervention(s): RN made aware;Repositioned  Prior Functioning    SLP Evaluation Cognition Overall Cognitive Status: Impaired/Different from baseline Arousal/Alertness: Awake/alert Orientation Level: Oriented X4 Attention: Sustained Sustained Attention: Impaired Sustained Attention Impairment: Verbal  basic;Functional basic Memory: Impaired Memory Impairment: Decreased recall of new information;Decreased short term memory;Retrieval deficit Decreased Short Term Memory: Verbal basic;Functional basic Awareness: Impaired Awareness Impairment: Intellectual impairment;Emergent impairment Problem Solving: Impaired Problem Solving Impairment: Verbal basic;Functional basic Executive Function:  (all impaired due to lower level deficits) Behaviors: Impulsive Safety/Judgment: Impaired  Comprehension Auditory Comprehension Overall Auditory Comprehension: Appears within functional limits for tasks assessed Yes/No Questions: Not tested Commands: Within Functional Limits Conversation: Simple Visual Recognition/Discrimination Discrimination: Not tested Reading Comprehension Reading Status: Not tested Expression Expression Primary Mode of Expression: Verbal Verbal Expression Overall Verbal Expression: Appears within functional limits for tasks assessed (intermittent language of confusion) Non-Verbal Means of Communication: Not applicable Written Expression Written Expression: Not tested Oral Motor Oral Motor/Sensory Function Overall Oral Motor/Sensory Function: Moderate impairment Facial ROM: Reduced left;Suspected CN VII (facial) dysfunction Facial Symmetry: Abnormal symmetry left;Suspected CN VII (facial) dysfunction Facial Strength: Reduced left;Suspected CN VII (facial) dysfunction Facial Sensation: Reduced left;Suspected CN V (Trigeminal) dysfunction Lingual ROM: Reduced left Lingual Symmetry: Within Functional Limits Lingual Strength: Reduced Velum: Within Functional Limits Mandible: Within Functional Limits Motor Speech Overall Motor Speech: Appears within functional limits for tasks assessed Phonation: Low vocal intensity Intelligibility: Intelligible Motor Speech Errors: Not applicable    Intelligibility: Intelligible  Bedside Swallowing Assessment General Date of  Onset: 12/06/19 Previous Swallow Assessment: MBSS 12/12/19 Diet Prior to this Study: Dysphagia 2 (chopped);Thin liquids Temperature Spikes Noted: No Respiratory Status: Room air History of Recent Intubation: Yes Length of Intubations (days): 3 days Date extubated: 12/10/19 Behavior/Cognition: Alert;Cooperative;Pleasant mood Oral Cavity - Dentition: Adequate natural dentition Self-Feeding Abilities: Able to feed self Vision: Functional for self-feeding Patient Positioning: Upright in bed Volitional Cough: Strong Volitional Swallow: Able to elicit  Oral Care Assessment Does patient have any of the following "high(er) risk" factors?: None of the above Does patient have any of the following "at risk" factors?: Other - dysphagia Patient is AT RISK: Order set for Adult Oral Care Protocol initiated -  "At Risk Patients" option selected (see row information) Ice Chips Ice chips: Not tested Thin Liquid Thin Liquid: Impaired Presentation: Cup;Straw Oral Phase  Impairments: Reduced labial seal Nectar Thick Nectar Thick Liquid: Not tested Honey Thick Honey Thick Liquid: Not tested Puree Puree: Impaired Oral Phase Impairments: Reduced labial seal Oral Phase Functional Implications: Left anterior spillage Solid Solid: Impaired Presentation: Self Fed Oral Phase Impairments: Reduced labial seal;Poor awareness of bolus Oral Phase Functional Implications: Left anterior spillage;Left lateral sulci pocketing BSE Assessment Suspected Esophageal Findings Suspected Esophageal Findings: Belching Risk for Aspiration Impact on safety and function: Mild aspiration risk Other Related Risk Factors: Cognitive impairment  Short Term Goals: Week 1: SLP Short Term Goal 1 (Week 1): Pt will consume current diet with efficient mastication and oral clearance and min overt s/sx aspiration with Min A verbal/visual cues for use of compensatory swallow strategies to minimize anterior loss and oral clearance. SLP  Short Term Goal 2 (Week 1): Pt will demonstrate efficient mastication and oral clearance of Dys 3 textures over 3 sessions prior to advancement. SLP Short Term Goal 3 (Week 1): Pt will demonstrate ability to problem solve basic situations with Mod A verbal/visual cues. SLP Short Term Goal 4 (Week 1): Pt will sustain attention to functional tasks with Mod A verbal/visual cues. SLP Short Term Goal 5 (Week 1): Pt will recall new and/or daily information with Mod A cues for use of strategies/aids. SLP Short Term Goal 6 (Week 1): Pt will detect functional errors with Max A multimodal cues.  Refer to Care Plan for Long Term Goals  Recommendations for other services: Neuropsych  Discharge Criteria: Patient will be discharged from SLP if patient refuses treatment 3 consecutive times without medical reason, if treatment goals not met, if there is a change in medical status, if patient makes no progress towards goals or if patient is discharged from hospital.  The above assessment, treatment plan, treatment alternatives and goals were discussed and mutually agreed upon: by patient  Arbutus Leas 12/16/2019, 12:37 PM

## 2019-12-16 NOTE — Progress Notes (Signed)
Patient transported to x-ray. ?

## 2019-12-16 NOTE — Plan of Care (Signed)
Problem: Consults Goal: RH STROKE PATIENT EDUCATION Description: See Patient Education module for education specifics  Outcome: Progressing   Problem: RH BLADDER ELIMINATION Goal: RH STG MANAGE BLADDER WITH ASSISTANCE Description: STG Manage Bladder With min Assistance Outcome: Progressing   Problem: RH SKIN INTEGRITY Goal: RH STG SKIN FREE OF INFECTION/BREAKDOWN Description: Pt will be free of skin breakdown/infection with min assist during CIR stay Outcome: Progressing   Problem: RH SAFETY Goal: RH STG ADHERE TO SAFETY PRECAUTIONS W/ASSISTANCE/DEVICE Description: STG Adhere to Safety Precautions With cues/reminders Assistance/Device. Outcome: Progressing   Problem: RH KNOWLEDGE DEFICIT Goal: RH STG INCREASE KNOWLEDGE OF HYPERTENSION Description: Pt/family will be able to demonstrate knowledge of HTN management with medications and diet precautions and ways to monitor blood pressure with min assist using handouts/booklets provided by staff Outcome: Progressing Goal: RH STG INCREASE KNOWLEGDE OF HYPERLIPIDEMIA Description: Pt/family will be able to demonstrate knowledge of HLD management with medications and diet precautions with min assist using handouts/booklets provided by staff Outcome: Progressing Goal: RH STG INCREASE KNOWLEDGE OF STROKE PROPHYLAXIS Description: Pt/family will be able to demonstrate knowledge of stroke prevention with medications and diet precautions and ways to monitor signs/symptoms of stroke with min assist using handouts/booklets provided by staff Outcome: Progressing   Problem: Consults Goal: RH STROKE PATIENT EDUCATION Description: See Patient Education module for education specifics  Outcome: Progressing Goal: Nutrition Consult-if indicated Outcome: Progressing Goal: Diabetes Guidelines if Diabetic/Glucose > 140 Description: If diabetic or lab glucose is > 140 mg/dl - Initiate Diabetes/Hyperglycemia Guidelines & Document Interventions  Outcome:  Progressing   Problem: RH BOWEL ELIMINATION Goal: RH STG MANAGE BOWEL WITH ASSISTANCE Description: STG Manage Bowel with Assistance. Outcome: Progressing Goal: RH STG MANAGE BOWEL W/MEDICATION W/ASSISTANCE Description: STG Manage Bowel with Medication with Assistance. Outcome: Progressing   Problem: RH BLADDER ELIMINATION Goal: RH STG MANAGE BLADDER WITH ASSISTANCE Description: STG Manage Bladder With Assistance Outcome: Progressing Goal: RH STG MANAGE BLADDER WITH MEDICATION WITH ASSISTANCE Description: STG Manage Bladder With Medication With Assistance. Outcome: Progressing Goal: RH STG MANAGE BLADDER WITH EQUIPMENT WITH ASSISTANCE Description: STG Manage Bladder With Equipment With Assistance Outcome: Progressing   Problem: RH SKIN INTEGRITY Goal: RH STG SKIN FREE OF INFECTION/BREAKDOWN Outcome: Progressing Goal: RH STG MAINTAIN SKIN INTEGRITY WITH ASSISTANCE Description: STG Maintain Skin Integrity With Assistance. Outcome: Progressing Goal: RH STG ABLE TO PERFORM INCISION/WOUND CARE W/ASSISTANCE Description: STG Able To Perform Incision/Wound Care With Assistance. Outcome: Progressing   Problem: RH SAFETY Goal: RH STG ADHERE TO SAFETY PRECAUTIONS W/ASSISTANCE/DEVICE Description: STG Adhere to Safety Precautions With Assistance/Device. Outcome: Progressing Goal: RH STG DECREASED RISK OF FALL WITH ASSISTANCE Description: STG Decreased Risk of Fall With Assistance. Outcome: Progressing   Problem: RH COGNITION-NURSING Goal: RH STG USES MEMORY AIDS/STRATEGIES W/ASSIST TO PROBLEM SOLVE Description: STG Uses Memory Aids/Strategies With Assistance to Problem Solve. Outcome: Progressing Goal: RH STG ANTICIPATES NEEDS/CALLS FOR ASSIST W/ASSIST/CUES Description: STG Anticipates Needs/Calls for Assist With Assistance/Cues. Outcome: Progressing   Problem: RH PAIN MANAGEMENT Goal: RH STG PAIN MANAGED AT OR BELOW PT'S PAIN GOAL Outcome: Progressing   Problem: RH KNOWLEDGE  DEFICIT Goal: RH STG INCREASE KNOWLEDGE OF DIABETES Outcome: Progressing Goal: RH STG INCREASE KNOWLEDGE OF HYPERTENSION Outcome: Progressing Goal: RH STG INCREASE KNOWLEDGE OF DYSPHAGIA/FLUID INTAKE Outcome: Progressing Goal: RH STG INCREASE KNOWLEGDE OF HYPERLIPIDEMIA Outcome: Progressing Goal: RH STG INCREASE KNOWLEDGE OF STROKE PROPHYLAXIS Outcome: Progressing   Problem: RH Vision Goal: RH LTG Vision (Specify) Outcome: Progressing   Problem: RH Pre-functional/Other (Specify) Goal: RH LTG Pre-functional (Specify) Outcome: Progressing  Goal: RH LTG Interdisciplinary (Specify) 1 Description: RH LTG Interdisciplinary (Specify)1 Outcome: Progressing Goal: RH LTG Interdisciplinary (Specify) 2 Description: RH LTG Interdisciplinary (Specify) 2  Outcome: Progressing

## 2019-12-16 NOTE — Progress Notes (Signed)
Inpatient Rehabilitation Care Coordinator Assessment and Plan  Patient Details  Name: Dennis Mercado MRN: [id number] Date of Birth: [birth year]  Today's Date: 12/16/2019  Problem List:  Patient Active Problem List   Diagnosis Date Noted  . Marijuana abuse   . Essential hypertension   . Dysphagia, post-stroke   . Left hemiplegia ([place])   . Acute respiratory failure ([place])   . ICH (intracerebral hemorrhage) ([place]) 12/06/2019   Past Medical History: History reviewed. No pertinent past medical history. Past Surgical History: History reviewed. No pertinent surgical history. Social History:  reports that he has never smoked. He has never used smokeless tobacco. He reports current alcohol use. He reports that he does not use drugs.  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 [employer] Read: Yes Write: Yes Employment Status: Employed Name of Employer: [employer]   Abuse/Neglect Abuse/Neglect Assessment Can Be Completed: Yes Physical Abuse: Denies Verbal Abuse: Denies Sexual Abuse: Denies Exploitation of patient/patient's resources: Denies Self-Neglect: Denies  Emotional Status Pt's affect, behavior and adjustment status: Mood flucuates Recent Psychosocial Issues: no Psychiatric History: no Substance Abuse History: no  Patient / Family Perceptions, Expectations & Goals Pt/Family understanding of illness & functional limitations: yes Pt/family expectations/goals: Goal to discharge back home  [place]: None Premorbid Home Care/DME Agencies: None Transportation available at discharge: spouse able to transport Resource referrals recommended: Neuropsychology  Discharge  Planning Living Arrangements: Spouse/significant other Support Systems: Spouse/significant other Type of Residence: Private residence (1 Level home: 1 steps to enter (about 6')) Insurance Resources: Multimedia programmer (specify) ([place]) Does the patient have any problems obtaining your medications?: No Care Coordinator Barriers to Discharge: Lack of/limited family support, Decreased caregiver support Care Coordinator Anticipated Follow Up Needs: [place] Additional Notes/Comments: Spouse works during day  [place] entered room, introduced self and explain role. SW will continue to follow up with questions and concerns   Dyanne Iha 12/16/2019, 1:00 PM

## 2019-12-16 NOTE — Progress Notes (Signed)
Inpatient Rehabilitation  Patient information reviewed and entered into eRehab system by Marcellina Jonsson M. Sebron Mcmahill, M.A., CCC/SLP, PPS Coordinator.  Information including medical coding, functional ability and quality indicators will be reviewed and updated through discharge.    

## 2019-12-16 NOTE — Evaluation (Addendum)
Physical Therapy Assessment and Plan  Patient Details  Name: Dennis Mercado MRN: [id number] Date of Birth: 1981-06-04  PT Diagnosis: Abnormal posture, Abnormality of gait, Cognitive deficits, Coordination disorder, Difficulty walking, Edema, Hemiparesis non-dominant, Hypertonia, Impaired cognition, Impaired sensation, Muscle weakness and Pain in neck Rehab Potential: Good ELOS: ~4 weeks   Today's Date: 12/16/2019 PT Individual Time: 7681-1572 and 1703-1740 PT Individual Time Calculation (min): 17 min and 37 min Today's Date: 12/16/2019 PT Missed Time: 21 Minutes Missed Time Reason: Xray (Addendum: added missed time)  Hospital Problem: Active Problems:   ICH (intracerebral hemorrhage) ([place])   Past Medical History: History reviewed. No pertinent past medical history. Past Surgical History: History reviewed. No pertinent surgical history.  Assessment & Plan Clinical Impression: Patient is a 39 y.o. year old right-handed male with documented history of hypertension on no current antihypertensive medications.  History taken from chart review and wife due to cognition.  Patient lives with spouse.  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  1 level home one-step to entry.  He presented on 12/06/2019 with left hemiparesis and AMS during sexual activity.  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  Per report, measuring 3.6 x 2.8 x 2.6 cm.  Hemorrhage extends into the ventricles and could be seen in the third and fourth ventricles without hydrocephalus.  Local mass-effect without midline shift.  CT angiogram of head and neck normal variant CTA circle of Willis without significant proximal stenosis aneurysm or branch vessel occlusion.  Admission chemistries potassium 3.2, creatinine 1.52, urine drug screen positive marijuana.  Echocardiogram with ejection fraction of 65%, no wall motion abnormalities.  EEG negative for seizures.  Carotid Dopplers unremarkable.   Venous Dopplers upper extremity negative for DVT.  Follow-up CT, personally reviewed, stable hemorrhage.  Unchanged size of right thalamic intraparenchymal hematoma with mild surrounding edema.  Hospital course further complicated by fevers presumed to be secondary to large left lung opacity concerning for pneumonia.  He completed course of Unasyn.  Dysphagia #2 thin liquids.  Cleviprex ongoing for blood pressure control since discontinued.  He was cleared to begin subcutaneous heparin for DVT prophylaxis 12/11/2019.  Therapy evaluations completed and patient was admitted for a comprehensive rehab program.  Patient transferred to CIR on 12/15/2019 .   Patient currently requires total assist with mobility secondary to muscle weakness, muscle joint tightness and muscle paralysis, decreased cardiorespiratoy endurance, impaired timing and sequencing, abnormal tone, unbalanced muscle activation, motor apraxia, decreased coordination and decreased motor planning, decreased visual perceptual skills and decreased visual motor skills, decreased midline orientation and decreased attention to left, decreased initiation, decreased attention, decreased awareness, decreased problem solving, decreased safety awareness, decreased memory and delayed processing and decreased sitting balance, decreased standing balance, decreased postural control and decreased balance strategies.  Prior to hospitalization, patient was independent  with mobility and lived with Spouse in a House home.  Home access is 2 platformStairs to enter.  Patient will benefit from skilled PT intervention to maximize safe functional mobility, minimize fall risk and decrease caregiver burden for planned discharge home with 24 hour assist.  Anticipate patient will benefit from follow up OP at discharge.  PT - End of Session Activity Tolerance: Tolerates 30+ min activity with multiple rests Endurance Deficit: Yes Endurance Deficit Description: Pt sleepy and  tired during session PT Assessment Rehab Potential (ACUTE/IP ONLY): Good PT Barriers to Discharge: [employer] home environment;Decreased caregiver support;Home environment access/layout;Medical stability;Neurogenic Bowel & Bladder;Behavior;Nutrition means;Lack of/limited family support PT Patient demonstrates  impairments in the following area(s): Balance;Perception;Behavior;Safety;Sensory;Edema;Motor;Skin Integrity;Endurance;Nutrition;Pain PT Transfers Functional Problem(s): Bed Mobility;Bed to Chair;Car;Furniture PT Locomotion Functional Problem(s): Ambulation;Wheelchair Mobility;Stairs PT Plan PT Intensity: Minimum of 1-2 x/day ,45 to 90 minutes PT Frequency: 5 out of 7 days PT Duration Estimated Length of Stay: ~4 weeks PT Treatment/Interventions: Ambulation/gait training;Community reintegration;DME/adaptive equipment instruction;Neuromuscular re-education;Psychosocial support;Stair training;UE/LE Strength taining/ROM;Wheelchair propulsion/positioning;Balance/vestibular training;Discharge planning;Functional electrical stimulation;Pain management;Skin care/wound management;UE/LE Coordination activities;Therapeutic Activities;Cognitive remediation/compensation;Disease management/prevention;Functional mobility training;Patient/family education;Splinting/orthotics;Therapeutic Exercise;Visual/perceptual remediation/compensation PT Transfers Anticipated Outcome(s): min assist PT Locomotion Anticipated Outcome(s): TBD if will be functional ambulator upon discharge PT Recommendation Recommendations for Other Services: Neuropsych consult;Therapeutic Recreation consult Therapeutic Recreation Interventions: Stress management;Outing/community reintergration Follow Up Recommendations: 24 hour supervision/assistance;Outpatient PT Patient destination: Home Equipment Recommended: To be determined  Skilled Therapeutic Intervention Session 1: Pt received supine in bed and therapist initiated evaluation  including assessment of cognition, strength, tone, sensation, and gathering subjective information when transport services arrived to take patient to X-Ray. Caryl Pina, RN notified and present - she discussed with Linna Hoff, [place] and he recommended pt be taken to x-ray at this time. Therapist planning to return at later time as able and as pt available to complete examination and perform remainder of treatment session. Missed 21 minutes of skilled physical therapy.  Session 2: Therapist returned to pt room and he was supine in bed and agreeable to therapy session. Pt reports he is really upset because he was "left out in the hall for an hour" (pt believes that his room is the hall) and he was "cold" and was "yelling" for someone to come help him but no-one would come - pt noted to not have call bell in reach and the bed not plugged into the wall system - therapist corrected and notified Caryl Pina, Therapist, sports. Pt requesting for assistance to call his wife and therapist assisted pt in dialing her number for comfort as he was upset and frustrated with the situation - therapist and wife provided emotional support and education on purpose of rehab. Pt also reports he is upset because he is very hungry and has asked for snacks but none of been provided - therapist educated him on his current dysphasia 2 diet per SLP and that we can only provide certain textured food - pt verbalized understanding and therapist discussed his wife bringing him in snacks that are approved by SLP to have between meals if needed. Pt was consolable and able to continue with therapy session. Therapist retrieved patient a TIS w/c with roho cushion to increase upright, OOB activity tolerance and provide pressure relief. Supine>sitting R EOB with max assist for L hemibody management and trunk upright. Sitting EOB pt with L posterior lean but able to use R UE support to maintain upright with min assist -  without UE support requires max assist to maintain upright.  Sit>stand EOB>stedy with min assist for lifting and balance with slight L lateral lean. Stedy transfer to TIS w/c. Sit<>stand to/from stedy seat with min assist for lifting and balance. Janett Billow, NT present and educated on performing +2 assist stedy transfers with nursing and therapist assisted NT in performing 1x sit<>stand w/c<>stedy seat to allow NT hands-on practice and pt more repetitions with L LE WBing - NT reports confidence with assisting pt. Pt left seated in TIS w/c with seat belt alarm on, telesitter in place, and NT present to assume care of pt and assist with meal.   PT Evaluation Precautions/Restrictions Precautions Precautions: Fall Precaution Comments: L UE/LE severe paresis with impaired sensation; impaired safety awareness  Restrictions Weight Bearing Restrictions: No Pain Pain Assessment Pain Scale: 0-10 Pain Score: 6  Pain Type: Acute pain Pain Location: Neck Pain Orientation: Right;Lower Pain Descriptors / Indicators: Aching Pain Onset: On-going Pain Intervention(s): Rest;Repositioned;Cold applied;Emotional support;Distraction Multiple Pain Sites: No Home Living/Prior Functioning Home Living Living Arrangements: Spouse/significant other Available Help at Discharge: Family;Available 24 hours/day;Friend(s) Type of Home: House Home Access: Stairs to enter [employer] of Steps: 2 platform Home Layout: One level  Lives With: Spouse Prior Function Level of Independence: Independent with gait;Independent with transfers;Independent with homemaking with ambulation  Able to Take Stairs?: Yes Driving: Yes Vocation: Full time employment Comments: Pt was fully independent.  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[employer] Perception: Impaired Inattention/Neglect: Does not attend to left side of body Praxis Praxis: Intact Praxis Impairment Details: Ideation;Ideomotor;Motor planning  Cognition  Overall Cognitive Status: Impaired/Different from  baseline Arousal/Alertness: Lethargic Orientation Level: Oriented X4 Attention: Sustained Sustained Attention: Impaired Memory: Impaired Awareness: Impaired Problem Solving: Impaired Behaviors: Impulsive Safety/Judgment: Impaired Sensation Sensation Light Touch: Impaired Detail Light Touch Impaired Details: Absent LLE Hot/Cold: Not tested Proprioception: Impaired Detail Proprioception Impaired Details: Absent LLE Stereognosis: Not tested Coordination Gross Motor Movements are Fluid and Coordinated: No Fine Motor Movements are Fluid and Coordinated: No Coordination and Movement Description: impaired due to L LE and UE paresis and hypertonia as well as due to impaired trunk control, midline orientation, awareness, and  motor planning Heel Shin Test: unable with L LE; WNL with R LE Motor  Motor Motor: Hemiplegia;Abnormal postural alignment and control;Abnormal tone Motor - Skilled Clinical Observations: Severe LUE and LLE hemiparesis and hypertoniaMobility Bed Mobility Bed Mobility: Supine to Sit;Rolling Right;Rolling Left;Sit to Supine Rolling Right: Maximal Assistance - Patient 25-49% Rolling Left: Moderate Assistance - Patient 50-74% Supine to Sit: Maximal Assistance - Patient - Patient 25-49% Sit to Supine: Maximal Assistance - Patient 25-49% Transfers Transfers: Sit to Stand;Stand to Sit;Transfer via [place] to Stand: Moderate Assistance - Patient 50-74% Stand to Sit: Moderate Assistance - Patient 50-74% Transfer (Assistive device): Other (Comment) (stedy) Transfer via Lift Equipment: Animal nutritionist: No Gait Gait: No Stairs / Additional Locomotion Stairs: No Product manager Mobility: No  Trunk/Postural Assessment  Cervical Assessment Cervical Assessment: Exceptions to [facility] (guarded cervical rotation due to pain) Thoracic Assessment Thoracic Assessment: Exceptions to [facility] (shoulder protraction) Lumbar Assessment Lumbar  Assessment: Exceptions to [facility] (posterior pelvic tilt with posterior lean in sitting) Postural Control Postural Control: Deficits on evaluation Trunk Control: impaired with posterior lean and LOB without support and assistance Protective Responses: delayed, insufficient, and with poor motor planning strategy  Balance Balance Balance Assessed: Yes Static Sitting Balance Static Sitting - Balance Support: Feet supported;No upper extremity supported Static Sitting - Level of Assistance: 2: Max assist Dynamic Sitting Balance Dynamic Sitting - Balance Support: During functional activity Dynamic Sitting - Level of Assistance: 1: +1 Total assist Static Standing Balance Static Standing - Balance Support: During functional activity Static Standing - Level of Assistance: 2: Max assist Dynamic Standing Balance Dynamic Standing - Balance Support: During functional activity Dynamic Standing - Level of Assistance: 1: +1 Total assist Extremity Assessment      RLE Assessment RLE Assessment: Within Functional Limits General Strength Comments: Grossly 5/5 throughout LLE Assessment LLE Assessment: Exceptions to [facility] Passive Range of Motion (PROM) Comments: Limited ROM noted performing the following movements: hip abduction, hip flexion, and hip flexion with knee extended LLE Strength Left Hip Flexion: 0/5 Left Hip Extension: 0/5  Left Hip ABduction: 0/5 Left Hip ADduction: 0/5 Left Knee Flexion: 0/5 Left Knee Extension: 0/5 Left Ankle Dorsiflexion: 0/5 Left Ankle Plantar Flexion: 0/5 LLE Tone LLE Tone: Hypertonic Hypertonic Details: increased tone in hamstrings, adductors, and gluteal muscles    Refer to Care Plan for Long Term Goals  Recommendations for other services: Neuropsych and Therapeutic Recreation  Stress management and Outing/community reintegration  Discharge Criteria: Patient will be discharged from PT if patient refuses treatment 3 consecutive times without medical reason, if  treatment goals not met, if there is a change in medical status, if patient makes no progress towards goals or if patient is discharged from hospital.  The above assessment, treatment plan, treatment alternatives and goals were discussed and mutually agreed upon: by patient  Tawana Scale , PT, DPT, CSRS 12/16/2019, 3:16 PM

## 2019-12-16 NOTE — Progress Notes (Signed)
Patient exhibited periods of confusion, restlessness, impulsive behavior and poor safety awareness.  Several attempts to exit bed prompted close supervision and environmental interventions all night by nursing staff.  C/o neck pain unrelieved by PRN Tylenol.  Nursing requests evaluation for Telesitter and additional interventions due to high fall risk concerns.  Taylon Louison B. Rulon Eisenmenger, MSN, RN, CNL

## 2019-12-16 NOTE — Care Management (Signed)
Patient ID: Dennis Mercado, male   DOB: [birth year], 39 y.o.   MRN: [id number] Met with patient and wife to review role of Nurse CM and collaboration with SW Margreta Journey) to address concerns and facilitate preparation for discharge. Reviewed secondary risks for stroke including HTN. Clarified with wife; rationale for Keppra after a hemorrhage. Reviewed HH/DASH diet and cooking with less salt. No other questions/concerns noted at present.

## 2019-12-16 NOTE — Progress Notes (Signed)
[place] PHYSICAL MEDICINE & REHABILITATION PROGRESS NOTE   Subjective/Complaints:  No issues this am but reportedly was restless last noc Pt keeps eyes closed but responds to questions   ROS- neg CP, SOB, N/V/D   Objective:   No results found. Recent Labs    12/15/19 0408 12/16/19 0237  WBC 11.6* 12.7*  HGB 13.4 12.4*  HCT 42.0 39.0  PLT 327 330   Recent Labs    12/15/19 0408 12/16/19 0237  NA 140 141  K 3.6 3.5  CL 110 112*  CO2 21* 22  GLUCOSE 126* 134*  BUN 19 28*  CREATININE 1.37* 1.57*  CALCIUM 8.8* 8.8*    Intake/Output Summary (Last 24 hours) at 12/16/2019 0856 Last data filed at 12/15/2019 2055 Gross per 24 hour  Intake 120 ml  Output 725 ml  Net -605 ml     Physical Exam: Vital Signs Blood pressure (!) 144/98, pulse 84, temperature 99.2 F (37.3 C), resp. rate 18, height 5\' 7"  (1.702 m), weight 71.1 kg, SpO2 100 %.  General: No acute distress Mood and affect are appropriate Heart: Regular rate and rhythm no rubs murmurs or extra sounds Lungs: Clear to auscultation, breathing unlabored, no rales or wheezes Abdomen: Positive bowel sounds, soft nontender to palpation, nondistended Extremities: No clubbing, cyanosis, or edema Skin: No evidence of breakdown, no evidence of rash Neurologic: Cranial nerves II through XII intact, motor strength is 5/5 in right an d 0/5 Left deltoid, bicep, tricep, grip, hip flexor, knee extensors, ankle dorsiflexor and plantar flexor Sensory exam absent sensation to light touch and proprioception in LEFT upper and lower extremities  Musculoskeletal: no pain with UE or LE ROM     Assessment/Plan: 1. 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  Physiatrist is providing close team supervision and 24 hour management of active medical problems listed below.  Physiatrist and rehab team continue to assess  barriers to discharge/monitor patient progress toward functional and medical goals  Care Tool:  Bathing              Bathing assist       Upper Body Dressing/Undressing Upper body dressing Upper body dressing/undressing activity did not occur (including orthotics): N/A What is the patient wearing?: Hospital gown only    Upper body assist      Lower Body Dressing/Undressing Lower body dressing    Lower body dressing activity did not occur: N/A What is the patient wearing?: Hospital gown only     Lower body assist       Toileting Toileting    Toileting assist Assist for toileting: 2 Helpers     Transfers Chair/bed transfer  Transfers assist  Chair/bed transfer activity did not occur: N/A        Locomotion Ambulation   Ambulation assist              Walk 10 feet activity   Assist           Walk 50 feet activity   Assist           Walk 150 feet activity   Assist           Walk 10 feet on uneven surface  activity   Assist           Wheelchair     Assist  Wheelchair 50 feet with 2 turns activity    Assist            Wheelchair 150 feet activity     Assist          Blood pressure (!) 144/98, pulse 84, temperature 99.2 F (37.3 C), resp. rate 18, height 5\' 7"  (1.702 m), weight 71.1 kg, SpO2 100 %.  Medical Problem List and Plan: 1.  Left-sided hemiplegia and altered mental status secondary to right thalamic ICH with IVH likely hypertensive             -patient may shower             -ELOS/Goals: 17-22 days/min A             Admit to CIR 2.  Antithrombotics: -DVT/anticoagulation: Subcutaneous heparin initiated 12/11/2019             -antiplatelet therapy: N/A 3. Pain Management: Tylenol as needed 4. Mood: Provide emotional support             -antipsychotic agents: N/A 5. Neuropsych: This patient is?  Fully capable of making decisions on his own behalf. 6. Skin/Wound  Care: Routine skin checks 7. Fluids/Electrolytes/Nutrition: Routine in and outs.  CMP ordered. 8.  Post stroke dysphagia.  Dysphagia #2 thin liquids.  Follow-up speech therapy.  Advance diet as tolerated 9.  Left lung pneumonia.  Intravenous Unasyn completed 10.  Hypertension.  Norvasc 10 mg daily, Coreg 25 mg twice daily, clonidine 0.3 mg twice daily, hydralazine 100 mg every 8 hours, lisinopril 10 mg twice daily.   Vitals:   12/15/19 1946 12/16/19 0431  BP: (!) 151/102 (!) 144/98  Pulse: 84 84  Resp: 18 18  Temp: 98 F (36.7 C) 99.2 F (37.3 C)  SpO2: 98% 100%  still running on high side but is <136mmHg, will monitor on current meds              Monitor with increased mobility 11.  Urine drug screen positive marijuana.  Counseled on appropriate  12.  Pre renal azotemia worsening  will enc fluid, give IVF   LOS: 1 days A FACE TO FACE EVALUATION WAS PERFORMED  [name] 12/16/2019, 8:56 AM

## 2019-12-16 NOTE — Progress Notes (Signed)
Inpatient [place] Individual Statement of Services  Patient Name:  Dennis Mercado  Date:  12/16/2019  Welcome to the [facility].  Our goal is to provide you with an individualized program based on your diagnosis and situation, designed to meet your specific needs.  With this comprehensive rehabilitation program, you will be expected to participate in at least 3 hours of rehabilitation therapies Monday-Friday, with modified therapy programming on the weekends.  Your rehabilitation program will include the following services:  Physical Therapy (PT), Occupational Therapy (OT), Speech Therapy (ST), 24 hour per day rehabilitation nursing, Therapeutic Recreaction (TR), Neuropsychology, Care Coordinator, Rehabilitation Medicine, Nutrition Services, Pharmacy Services and Other  Weekly team conferences will be held on Wednesdays to discuss your progress.  Your Inpatient Rehabilitation Care Coordinator will talk with you frequently to get your input and to update you on team discussions.  Team conferences with you and your family in attendance may also be held.  Expected length of stay: 18-22 Days  Overall anticipated outcome: Min A to Supervision  Depending on your progress and recovery, your program may change. Your Inpatient Rehabilitation Care Coordinator will coordinate services and will keep you informed of any changes. Your Inpatient Rehabilitation Care Coordinator's name and contact numbers are listed  below.  The following services may also be recommended but are not provided by the [facility]:    [place] will be made to provide these services after discharge if needed.  Arrangements include referral to agencies that provide these services.  Your insurance has been verified to be:  [place] Your primary doctor is:  Abelino Derrick, MD  Pertinent  information will be shared with your doctor and your insurance company.  Inpatient Rehabilitation Care Coordinator:  Erlene Quan, [place] or [phone]  Information discussed with and copy given to patient by: Dyanne Iha, 12/16/2019, 11:48 AM

## 2019-12-16 NOTE — Evaluation (Signed)
Occupational Therapy Assessment and Plan  Patient Details  Name: Dennis Mercado MRN: [id number] Date of Birth: [birth year]  OT Diagnosis: abnormal posture, cognitive deficits, flaccid hemiplegia and hemiparesis and muscle weakness (generalized) Rehab Potential: Rehab Potential (ACUTE ONLY): Excellent ELOS: 21-23 days   Today's Date: 12/16/2019 OT Individual Time: 9767-3419 OT Individual Time Calculation (min): 62 min     Hospital Problem: Active Problems:   ICH (intracerebral hemorrhage) ([place])   Past Medical History: History reviewed. No pertinent past medical history. Past Surgical History: History reviewed. No pertinent surgical history.  Assessment & Plan Clinical Impression: Patient is a 39 y.o. year old male with recent admission to the hospital on 12/06/2019 with left hemiparesis and AMS during sexual activity.  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  Per report, measuring 3.6 x 2.8 x 2.6 cm.  Hemorrhage extends into the ventricles and could be seen in the third and fourth ventricles without hydrocephalus.  Local mass-effect without midline shift..  Patient transferred to CIR on 12/15/2019 .    Patient currently requires total with basic self-care skills secondary to muscle weakness, impaired timing and sequencing, unbalanced muscle activation and decreased coordination, decreased attention to left and left side neglect, decreased initiation, decreased attention, decreased awareness, decreased problem solving, decreased safety awareness, decreased memory and delayed processing and decreased sitting balance, decreased standing balance, decreased postural control, hemiplegia and decreased balance strategies.  Prior to hospitalization, patient could complete ADLs and IADLs with independent .  Patient will benefit from skilled intervention to decrease level of assist with basic self-care skills and increase independence with basic self-care skills prior to discharge home with  care partner.  Anticipate patient will require minimal physical assistance and follow up outpatient.  OT - End of Session Activity Tolerance: Decreased this session Endurance Deficit: Yes Endurance Deficit Description: Pt sleepy and tired during session OT Assessment Rehab Potential (ACUTE ONLY): Excellent OT Patient demonstrates impairments in the following area(s): Balance;Perception;Cognition;Endurance;Motor;Vision;Sensory;Safety OT Basic ADL's Functional Problem(s): Eating;Grooming;Bathing;Dressing;Toileting OT Transfers Functional Problem(s): Toilet;Tub/Shower OT Additional Impairment(s): Fuctional Use of Upper Extremity OT Plan OT Intensity: Minimum of 1-2 x/day, 45 to 90 minutes OT Frequency: 5 out of 7 days OT Duration/Estimated Length of Stay: 21-23 days OT Treatment/Interventions: Balance/vestibular training;Cognitive remediation/compensation;Community reintegration;DME/adaptive equipment instruction;Discharge planning;Disease mangement/prevention;Functional mobility training;Functional electrical stimulation;Pain management;Self Care/advanced ADL retraining;Therapeutic Activities;UE/LE Coordination activities;Patient/family education;Therapeutic Exercise;Visual/perceptual remediation/compensation;Neuromuscular re-education;Splinting/orthotics;UE/LE Strength taining/ROM;Wheelchair propulsion/positioning;Psychosocial support OT Self Feeding Anticipated Outcome(s): supervision OT Basic Self-Care Anticipated Outcome(s): min assist OT Toileting Anticipated Outcome(s): min assist OT Bathroom Transfers Anticipated Outcome(s): min assist OT Recommendation Recommendations for Other Services: Neuropsych consult;Therapeutic Recreation consult Therapeutic Recreation Interventions: Stress management Patient destination: Home Follow Up Recommendations: Outpatient OT;24 hour supervision/assistance Equipment Recommended: To be determined   Skilled Therapeutic Intervention Pt in bed sleeping  to start session with his spouse present.  He was awakened to verbal stimulation but maintained a groggy state throughout session.  He was able to state why he was in the hospital as well as deficits that he noticed from the hemorrhage.  He needed max assist for rolling to the right side of the bed and for transition to sitting.  Increased LOB posteriorly in sitting as well as to the left requiring max assist overall.  With the RUE supported, he was able to progress to min assist for balance, but during bathing tasks he needed max assist.  Decreased emergent awareness noted when attempting to squeeze out washcloth with the RUE for bathing.  He applied pressure to the washbasin to  the point it was collapsing and water was spilling out onto the table and floor, yet he demonstrated no awareness of this and therapist had to physically move his arm.  When he was presented with a washcloth that was ready for washing, he would continue to attempt to place it back in the water and re-squeeze it out as well.  Finally, therapist just moved the basin and handed him the cloth for bathing.  He needed max assist to wash the RUE with therapist also assisting with washing his back, while providing overall max assist for balance.  He transitioned back to supine for washing his front peri area with setup and cueing.  Max assist was needed for therapist to wash his buttocks and apply a new brief, rolling side to side.  He transitioned back to sitting with max assist where he donned a pullover shirt as well as pants at max assist level sit to stand.  He was able to complete sit to stand with mod assist but needed max assist for taking a few small steps up toward the top of the bed.  Finished session with transition to supine with max assist and pt left in bed to rest with spouse present.  Discussed expected 24 hr min assist at discharge as well as outpatient OT expected at discharge.  Both pt and spouse voiced understanding of plan and  are in agreement.     OT Evaluation Precautions/Restrictions  Precautions Precautions: Fall Precaution Comments: L UE/LE flaccid with impaired sensation; impaired safety awareness Restrictions Weight Bearing Restrictions: No  Pain Pain Assessment Pain Scale: 0-10 Pain Score: 6  Pain Type: Acute pain Pain Location: Neck Pain Orientation: Right;Lower Pain Descriptors / Indicators: Aching Pain Onset: On-going Pain Intervention(s): Rest;Repositioned;Cold applied;Emotional support;Distraction Multiple Pain Sites: No Home Living/Prior Functioning Home Living Family/patient expects to be discharged to:: Private residence Living Arrangements: Spouse/significant other Available Help at Discharge: Family, Available 24 hours/day, Friend(s) Type of Home: House Home Access: Stairs to enter [employer] of Steps: 2 platform Entrance Stairs-Rails: None Home Layout: One level Bathroom Shower/Tub: Chiropodist: Standard Additional Comments: Mother and sister are both CNAs - sister works at [employer] on ?89M 7a-7p  Lives With: Spouse IADL History Current License: Yes Occupation: Full time employment Type of Occupation: 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[employer]: Moderate assistance Where Assessed-Eating: Bed level Grooming: Moderate assistance Where Assessed-Grooming: Bed level Upper Body Bathing: Maximal assistance Where Assessed-Upper Body Bathing: Edge of bed Lower Body Bathing: Maximal assistance Where Assessed-Lower Body Bathing: Edge of bed, Bed level Upper Body Dressing: Maximal assistance Where Assessed-Upper Body Dressing: Edge of bed Lower Body Dressing: Maximal assistance Where Assessed-Lower Body Dressing: Edge of  bed Toileting: Dependent Where Assessed-Toileting: Bedside Commode Toilet Transfer: Dependent Toilet Transfer Method: Arts development officer: Bedside commode Vision Baseline Vision/History: No visual deficits Patient Visual Report: Blurring of vision Vision Assessment?: Vision impaired- to be further tested in functional context Eye Alignment: Within Functional Limits Perception  Perception: Impaired Inattention/Neglect: Does not attend to left side of body Praxis Praxis: Intact Praxis Impairment Details: Ideation;Ideomotor;Motor planning Cognition Overall Cognitive Status: Impaired/Different from baseline Arousal/Alertness: Lethargic (sleepy) Orientation Level: Person;Place;Situation Person: Oriented Place: Oriented Situation: Oriented Year: 2021 Month: July Day of Week: Correct Memory:  Impaired Memory Impairment: Decreased short term memory Decreased Short Term Memory: Verbal basic;Functional basic Immediate Memory Recall: Sock;Blue;Bed Memory Recall Sock: Without Cue Memory Recall Blue: Without Cue Memory Recall Bed: Without Cue Attention: Focused Focused Attention: Appears intact Sustained Attention: Impaired Sustained Attention Impairment: Verbal basic;Functional basic Awareness: Impaired Awareness Impairment: Intellectual impairment;Emergent impairment Problem Solving: Impaired Problem Solving Impairment: Verbal basic;Functional basic Executive Function:  (all impaired due to lower level deficits) Behaviors: Impulsive Safety/Judgment: Impaired Sensation Sensation Light Touch: Impaired Detail Light Touch Impaired Details: Absent LUE Hot/Cold: Not tested Proprioception: Impaired Detail Proprioception Impaired Details: Absent LUE Stereognosis: Not tested Coordination Gross Motor Movements are Fluid and Coordinated: No Fine Motor Movements are Fluid and Coordinated: No Coordination and Movement Description: Pt currently Brunnstrum stage I in  the left hand and arm with no active movement noted.  Motor  Motor Motor: Hemiplegia;Abnormal postural alignment and control Motor - Skilled Clinical Observations: Severe LUE and LLE hemiparesis Mobility  Bed Mobility Bed Mobility: Rolling Right;Supine to Sit;Sit to Supine Rolling Right: Maximal Assistance - Patient 25-49% Supine to Sit: Maximal Assistance - Patient - Patient 25-49% Sit to Supine: Maximal Assistance - Patient 25-49% Transfers Sit to Stand: Moderate Assistance - Patient 50-74%  Trunk/Postural Assessment  Cervical Assessment Cervical Assessment: Within Functional Limits Thoracic Assessment Thoracic Assessment: Exceptions to [facility] (slight thoracic rounding) Lumbar Assessment Lumbar Assessment: Exceptions to [facility] (posterior pelvic tilt in sitting with posterior lean) Postural Control Postural Control: Deficits on evaluation Trunk Control: Pt with increased posterior lean as well as LOB to the left when sitting unsupported Protective Responses: delayed  Balance Balance Balance Assessed: Yes Static Sitting Balance Static Sitting - Balance Support: Feet supported;No upper extremity supported Static Sitting - Level of Assistance: 2: Max assist Dynamic Sitting Balance Dynamic Sitting - Balance Support: Feet supported;During functional activity Dynamic Sitting - Level of Assistance: 1: +1 Total assist Static Standing Balance Static Standing - Balance Support: During functional activity Static Standing - Level of Assistance: 2: Max assist Dynamic Standing Balance Dynamic Standing - Balance Support: During functional activity Dynamic Standing - Level of Assistance: 1: +1 Total assist Extremity/Trunk Assessment RUE Assessment RUE Assessment: Within Functional Limits LUE Assessment LUE Assessment: Exceptions to [facility] Passive Range of Motion (PROM) Comments: WFLS for all joints General Strength Comments: Pt currently Brunnstrum stage I in the arm and hand with no active  movement noted to command.     Refer to Care Plan for Long Term Goals  Recommendations for other services: Neuropsych and Therapeutic Recreation  Stress management   Discharge Criteria: Patient will be discharged from OT if patient refuses treatment 3 consecutive times without medical reason, if treatment goals not met, if there is a change in medical status, if patient makes no progress towards goals or if patient is discharged from hospital.  The above assessment, treatment plan, treatment alternatives and goals were discussed and mutually agreed upon: by patient and by family  Ommie Degeorge OTR/L 12/16/2019, 5:02 PM

## 2019-12-17 ENCOUNTER — Inpatient Hospital Stay (HOSPITAL_COMMUNITY): Payer: BC Managed Care – PPO | Admitting: Speech Pathology

## 2019-12-17 ENCOUNTER — Inpatient Hospital Stay (HOSPITAL_COMMUNITY): Payer: BC Managed Care – PPO | Admitting: Occupational Therapy

## 2019-12-17 ENCOUNTER — Inpatient Hospital Stay (HOSPITAL_COMMUNITY): Payer: BC Managed Care – PPO

## 2019-12-17 ENCOUNTER — Inpatient Hospital Stay (HOSPITAL_COMMUNITY): Payer: BC Managed Care – PPO | Admitting: Physical Therapy

## 2019-12-17 DIAGNOSIS — M7989 Other specified soft tissue disorders: Secondary | ICD-10-CM

## 2019-12-17 LAB — CBC WITH DIFFERENTIAL/PLATELET
Abs Immature Granulocytes: 0.09 10*3/uL — ABNORMAL HIGH (ref 0.00–0.07)
Basophils Absolute: 0 10*3/uL (ref 0.0–0.1)
Basophils Relative: 0 %
Eosinophils Absolute: 0.1 10*3/uL (ref 0.0–0.5)
Eosinophils Relative: 1 %
HCT: 39.9 % (ref 39.0–52.0)
Hemoglobin: 12.7 g/dL — ABNORMAL LOW (ref 13.0–17.0)
Immature Granulocytes: 1 %
Lymphocytes Relative: 12 %
Lymphs Abs: 1.5 10*3/uL (ref 0.7–4.0)
MCH: 27 pg (ref 26.0–34.0)
MCHC: 31.8 g/dL (ref 30.0–36.0)
MCV: 84.9 fL (ref 80.0–100.0)
Monocytes Absolute: 1 10*3/uL (ref 0.1–1.0)
Monocytes Relative: 7 %
Neutro Abs: 10.3 10*3/uL — ABNORMAL HIGH (ref 1.7–7.7)
Neutrophils Relative %: 79 %
Platelets: 394 10*3/uL (ref 150–400)
RBC: 4.7 MIL/uL (ref 4.22–5.81)
RDW: 14.6 % (ref 11.5–15.5)
WBC: 13 10*3/uL — ABNORMAL HIGH (ref 4.0–10.5)
nRBC: 0 % (ref 0.0–0.2)

## 2019-12-17 MED ORDER — ZOLPIDEM TARTRATE 5 MG PO TABS
5.0000 mg | ORAL_TABLET | Freq: Every evening | ORAL | Status: DC | PRN
  Administered 2019-12-18 – 2019-12-22 (×7): 5 mg via ORAL
  Filled 2019-12-17 (×7): qty 1

## 2019-12-17 NOTE — Progress Notes (Signed)
Patient is still up at this time, consistently pressing the call light asking for things like snacks & something for sleep. He was given melatonin by the previous nurse. When told that he became argumentative stating that she did not give him the medicine. "She must have taken it herself", he stated. Informed patient that there was nothing else on his MAR to help him sleep. No acute distress noted. Will continue to monitor.

## 2019-12-17 NOTE — Progress Notes (Signed)
Patient was up again at approximately 0415. He had an incontinent episode of urine & was given hygiene care. K pad was placed to the back of his neck. No requests for pain medication, but still asking for xanax. Communication left for the provider for evaluation. Will continue to monitor. No acute distress noted

## 2019-12-17 NOTE — Progress Notes (Signed)
Physical Therapy Session Note  Patient Details  Name: Dennis Mercado MRN: [id number] Date of Birth: November 26, 1980  Today's Date: 12/17/2019 PT Individual Time: 0917-1002 PT Individual Time Calculation (min): 45 min   Short Term Goals: Week 1:  PT Short Term Goal 1 (Week 1): Pt will perform supine<>sit with mod assist PT Short Term Goal 2 (Week 1): Pt will perform bed<>chair transfer with max assist (not using lift equipment) PT Short Term Goal 3 (Week 1): Pt will initiate gait training PT Short Term Goal 4 (Week 1): Pt will participate in sitting balance assessment  Skilled Therapeutic Interventions/Progress Updates:    Pt received supine in bed with RN present reporting pt was incontinent of bladder and was assisting pt with donning clean brief - pt states he though the urinal was in place. Pt agreeable to therapy session and so therapist assumed care of patient. Supine>sitting R EOB with max assist for L hemibody management and trunk upright. Pt continues to demonstrate hypertonia in L UE and L LE. Static sitting EOB with min assist for balance then up to max assist for dynamic sitting balance to remove soiled shirt - max cuing for increase pt involvement in task. Sit>stand EOB>stedy with mod assist for lifting and balance (+2 present for safety). Stedy transfer to sink. Sitting in high perched position in stedy with mirror feedback focused on midline orientation as pt has repeated L lateral trunk LOB requiring max/total assist to prevent posterior LOB. In this position performed oral care with mod assist for L UE management and trunk control - appears to perseverate some on this task requiring cuing after 1-63minutes to stop brushing. Donned clean shirt with mod/max assist for trunk control due to L and posterior LOB - pt responding well to verbal cuing for increased awareness of trunk control and able to correct with min assist. Stedy transfer to w/c. Sitting in w/c donned shorts max assist. Sit<>stand  w/c<>3 Musketeer support x2 with +2 mod assist for lifting/balance - pulled pants over hips max assist - progressed to L LE NMR via [place] and pre-gait stepping R LE forward/backwards - pt demonstrates some hamstring and hip musculature activation but does have knee buckling requiring assist to block. Pt left seated tilted back in w/c with needs in reach, seat belt alarm on, L UE and L LE supported with pillows, and telesitter in place.  Therapy Documentation Precautions:  Precautions Precautions: Fall Precaution Comments: L UE/LE severe paresis with impaired sensation; impaired safety awareness Restrictions Weight Bearing Restrictions: No  Pain:   No reports of pain throughout session.   Therapy/Group: Individual Therapy  Tawana Scale , PT, DPT, CSRS 12/17/2019, 7:53 AM

## 2019-12-17 NOTE — Progress Notes (Signed)
Occupational Therapy Session Note  Patient Details  Name: Dennis Mercado MRN: [id number] Date of Birth: 15-Nov-1980  Today's Date: 12/17/2019 OT Individual Time: 7014-1030 OT Individual Time Calculation (min): 56 min    Short Term Goals: Week 1:  OT Short Term Goal 1 (Week 1): Pt will maintain static sitting balance EOB with min assist in preparation for selfcare or transfers. OT Short Term Goal 2 (Week 1): Pt will complete bathing sit to stand with mod assist from supported position. OT Short Term Goal 3 (Week 1): Pt will complete UB dressing with min assist following hemi dressing techniques. OT Short Term Goal 4 (Week 1): Pt will complete toilet transfer with mod assist squat pivot to drop arm commode. OT Short Term Goal 5 (Week 1): Pt will return demonstrate self PROM exercises for the LUE during session with min assist following handout.  Skilled Therapeutic Interventions/Progress Updates:    Pt in tilt in space wheelchair to start session.  Took pt down to the therapy gym for work on Brewing technologist tasks.  He completed stand pivot transfer to the mat with max assist.  He then worked on dynamic sitting balance and posture in sitting with use of the mirror for feedback.  LUE was placed in weightbearing while having pt reach to the right to pick up clothespins with the RUE and then place on the vertical bars to the left.  Max assist needed to maintain sitting balance and return to midline.  Pt tends to over use his head and trunk in extensor patterns to help direct his trunk control.  In addition, he exhibits slight pusher tendencies to the left in sitting, extending the RLE and hiking up of the right shoulder.  Progressed to sit to squat transitions to place clothespins as well with mod assist for balance.  Increased pushing with LOB to the left noted.  He worked on static standing with overall mod assist.  Max facilitation needed for left knee extension while standing.  Returned to  the wheelchair max assist stand pivot and therapist rolled him back to the room.  Provided handouts with his spouse on positioning in bed as well as education on PROM for the LUE to be completed daily.  She will need return demonstration as this was not completed secondary to time limitations.  He was left sitting up in the wheelchair with call button and phone in reach.    Therapy Documentation Precautions:  Precautions Precautions: Fall Precaution Comments: L UE/LE severe paresis with impaired sensation; impaired safety awareness Restrictions Weight Bearing Restrictions: No    Pain: Pain Assessment Pain Scale: Faces Pain Score: 0-No pain Pain Type: Acute pain Pain Location: Neck Pain Orientation: Mid Pain Descriptors / Indicators: Aching Pain Onset: On-going Pain Intervention(s): RN made aware;Repositioned Multiple Pain Sites: No ADL: See Care Tool Section for some details of mobility and selfcare  Therapy/Group: Individual Therapy  Kaida Games OTR/L 12/17/2019, 12:23 PM

## 2019-12-17 NOTE — Progress Notes (Signed)
Speech Language Pathology Daily Session Note  Patient Details  Name: Dennis Mercado MRN: [id number] Date of Birth: 1981-04-22  Today's Date: 12/17/2019 SLP Individual Time: 3710-6269 SLP Individual Time Calculation (min): 26 min  Short Term Goals: Week 1: SLP Short Term Goal 1 (Week 1): Pt will consume current diet with efficient mastication and oral clearance and min overt s/sx aspiration with Min A verbal/visual cues for use of compensatory swallow strategies to minimize anterior loss and oral clearance. SLP Short Term Goal 2 (Week 1): Pt will demonstrate efficient mastication and oral clearance of Dys 3 textures over 3 sessions prior to advancement. SLP Short Term Goal 3 (Week 1): Pt will demonstrate ability to problem solve basic situations with Mod A verbal/visual cues. SLP Short Term Goal 4 (Week 1): Pt will sustain attention to functional tasks with Mod A verbal/visual cues. SLP Short Term Goal 5 (Week 1): Pt will recall new and/or daily information with Mod A cues for use of strategies/aids. SLP Short Term Goal 6 (Week 1): Pt will detect functional errors with Max A multimodal cues.  Skilled Therapeutic Interventions: Pt was seen for skilled ST targeting education with pt's wife  (April) as well as activities targeting cognitive goals. Education focused on pt's current diet recommendations and swallow strategies to minimize aspiration risk, encourage self-feeding, and clearance of left buccal pocketing. April was signed off to provide supervision during meals. SLP further facilitated session with Mod A verbal cues for error awareness and problem solving, and Max A for visual scanning during a basic money sorting and adding task. Pt very lethargic during session today, however when alert and participating he demonstrated improved sustained attention to tasks this afternoon in comparison to AM session. Pt left sitting in chair with alarm set and needs within reach, wife still present. Continue  per current plan of care.        Pain Pain Assessment Pain Score: 0-No pain  Therapy/Group: Individual Therapy  Arbutus Leas 12/17/2019, 7:10 AM

## 2019-12-17 NOTE — Progress Notes (Addendum)
[place] PHYSICAL MEDICINE & REHABILITATION PROGRESS NOTE   Subjective/Complaints:  Fever yesterday pm up to 102, rechecked at 100.5 few min later , elevated WBC, was restless according to nursing last night.  No elevated temp since that time, blood cultures pending.  Patient states that he took Xanax at home that he obtained from a coworker.  He said the blue ones were too strong but the peach ones were okay.  He also had tried the Ambien from his uncle and that worked.   Patient saw a dog in his room last night.  He is oriented to person and place this morning.  We discussed fever yesterday he was not aware of it.  We discussed the normal chest x-ray and the elevated white blood cell count  ROS- neg CP, SOB, N/V/D   Objective:   DG Chest 2 View  Result Date: 12/16/2019 CLINICAL DATA:  Fever EXAM: CHEST - 2 VIEW COMPARISON:  12/11/2019 FINDINGS: The heart size and mediastinal contours are within normal limits. Both lungs are clear. The visualized skeletal structures are unremarkable. IMPRESSION: No active cardiopulmonary disease. Electronically Signed   By: Rolm Baptise M.D.   On: 12/16/2019 16:19   Recent Labs    12/16/19 0237 12/17/19 0537  WBC 12.7* 13.0*  HGB 12.4* 12.7*  HCT 39.0 39.9  PLT 330 394   Recent Labs    12/15/19 0408 12/16/19 0237  NA 140 141  K 3.6 3.5  CL 110 112*  CO2 21* 22  GLUCOSE 126* 134*  BUN 19 28*  CREATININE 1.37* 1.57*  CALCIUM 8.8* 8.8*    Intake/Output Summary (Last 24 hours) at 12/17/2019 0651 Last data filed at 12/16/2019 1839 Gross per 24 hour  Intake 830 ml  Output 750 ml  Net 80 ml     Physical Exam: Vital Signs Blood pressure 137/70, pulse 92, temperature 98.6 F (37 C), temperature source Oral, resp. rate 16, height 5\' 7"  (1.702 m), weight 71.1 kg, SpO2 99 %.   General: No acute distress Mood and affect are appropriate Heart: Regular rate and rhythm no rubs murmurs or extra sounds Lungs: Clear to auscultation, breathing  unlabored, no rales or wheezes Abdomen: Positive bowel sounds, soft nontender to palpation, nondistended Extremities: No clubbing, cyanosis, or edema Skin: No evidence of breakdown, no evidence of rash   Neurologic: Cranial nerves II through XII intact, motor strength is 5/5 in right an d 0/5 Left deltoid, bicep, tricep, grip, hip flexor, knee extensors, ankle dorsiflexor and plantar flexor Sensory exam absent sensation to light touch and proprioception in LEFT upper and lower extremities  Musculoskeletal: no pain with UE or LE ROM     Assessment/Plan: 1. 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  Physiatrist is providing close team supervision and 24 hour management of active medical problems listed below.  Physiatrist and rehab team continue to assess barriers to discharge/monitor patient progress toward functional and medical goals  Care Tool:  Bathing    Body parts bathed by patient: Chest, Abdomen, Right upper leg, Left upper leg, Face, Right arm (supine to sit)   Body parts bathed by helper: Left arm, Front perineal area, Buttocks, Left lower leg, Right lower leg     Bathing assist Assist Level: Maximal Assistance - Patient 24 - 49%     Upper Body Dressing/Undressing Upper body dressing Upper body dressing/undressing activity did not occur (including orthotics): N/A  What is the patient wearing?: Pull over shirt    Upper body assist Assist Level: Maximal Assistance - Patient 25 - 49%    Lower Body Dressing/Undressing Lower body dressing    Lower body dressing activity did not occur: N/A What is the patient wearing?: Incontinence brief     Lower body assist Assist for lower body dressing: Total Assistance - Patient < 25%     Toileting Toileting    Toileting assist Assist for toileting: Total Assistance - Patient < 25%     Transfers Chair/bed  transfer  Transfers assist  Chair/bed transfer activity did not occur: N/A  Chair/bed transfer assist level: 2 Helpers     Locomotion Ambulation   Ambulation assist   Ambulation activity did not occur: Safety/medical concerns          Walk 10 feet activity   Assist  Walk 10 feet activity did not occur: Safety/medical concerns        Walk 50 feet activity   Assist Walk 50 feet with 2 turns activity did not occur: Safety/medical concerns         Walk 150 feet activity   Assist Walk 150 feet activity did not occur: Safety/medical concerns         Walk 10 feet on uneven surface  activity   Assist Walk 10 feet on uneven surfaces activity did not occur: Safety/medical concerns         Wheelchair     Assist Will patient use wheelchair at discharge?:  (TBD)             Wheelchair 50 feet with 2 turns activity    Assist            Wheelchair 150 feet activity     Assist          Blood pressure 137/70, pulse 92, temperature 98.6 F (37 C), temperature source Oral, resp. rate 16, height 5\' 7"  (1.702 m), weight 71.1 kg, SpO2 99 %.  Medical Problem List and Plan: 1.  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  Antithrombotics: -DVT/anticoagulation: Subcutaneous heparin initiated 12/11/2019, given elevated temp and off heparin until 6/26 d/t ICH  will check dopplers              -antiplatelet therapy: N/A 3. Pain Management: Tylenol as needed 4. Mood: Provide emotional support             -antipsychotic agents: N/A 5. Neuropsych: This patient is?  Fully capable of making decisions on his own behalf. 6. Skin/Wound Care: Routine skin checks 7. Fluids/Electrolytes/Nutrition: Routine in and outs.  CMP ordered. 8.  Post stroke dysphagia.  Dysphagia #2  thin liquids.  Follow-up speech therapy.  Advance diet as tolerated 9.  Left lung pneumonia.  Intravenous Unasyn completed 10.  Hypertension.  Norvasc 10 mg daily, Coreg 25 mg twice daily, clonidine 0.3 mg twice daily, hydralazine 100 mg every 8 hours, lisinopril 10 mg twice daily.   Vitals:   12/17/19 0203 12/17/19 0634  BP: (!) 147/101 137/70  Pulse: 65 92  Resp: 16   Temp: 98.3 F (36.8 C) 98.6 F (37 C)  SpO2: 99%  still running on high side but is <147mmHg, will monitor on current meds              Monitor with increased mobility 11.  Urine drug screen positive marijuana.  Counseled on appropriate  12.  Pre renal azotemia worsening  will enc fluid, give IVF x1 bolus 13.  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  This will likely occur more often at night.  Monitor for signs of alcohol withdrawal although drinking pattern was mainly on the weekends.  14.  Insomnia trial Ambien LOS: 2 days A FACE TO FACE EVALUATION WAS PERFORMED  [name] 12/17/2019, 6:51 AM

## 2019-12-17 NOTE — Progress Notes (Signed)
Lower extremity venous has been completed.   Preliminary results in CV Proc.   Abram Sander 12/17/2019 2:52 PM

## 2019-12-17 NOTE — Progress Notes (Signed)
Speech Language Pathology Daily Session Note  Patient Details  Name: Dennis Mercado MRN: [id number] Date of Birth: 23-Jan-1981  Today's Date: 12/17/2019 SLP Individual Time: 0720-0819 SLP Individual Time Calculation (min): 59 min  Short Term Goals: Week 1: SLP Short Term Goal 1 (Week 1): Pt will consume current diet with efficient mastication and oral clearance and min overt s/sx aspiration with Min A verbal/visual cues for use of compensatory swallow strategies to minimize anterior loss and oral clearance. SLP Short Term Goal 2 (Week 1): Pt will demonstrate efficient mastication and oral clearance of Dys 3 textures over 3 sessions prior to advancement. SLP Short Term Goal 3 (Week 1): Pt will demonstrate ability to problem solve basic situations with Mod A verbal/visual cues. SLP Short Term Goal 4 (Week 1): Pt will sustain attention to functional tasks with Mod A verbal/visual cues. SLP Short Term Goal 5 (Week 1): Pt will recall new and/or daily information with Mod A cues for use of strategies/aids. SLP Short Term Goal 6 (Week 1): Pt will detect functional errors with Max A multimodal cues.  Skilled Therapeutic Interventions: Pt was seen for skilled ST targeting dysphagia and cognitive goals. SLP provided skilled observation of pt consuming current diet dysphagia 2 (minced/fine chop) with thin liquids. Pt required Moderate verbal cues to utilize lingual sweeps and liquid washes to clear left buccal pocketing. Pt also observed taking pills whole with thin from RN - efficient was transit and pt formed cohesive boluses with dual consistency; no pills observed in oral cavity post swallow. No overt s/sx aspiration observed across any textures.  SLP also provided overall Mod a verbal cues for sustained attention to functional tasks for ~3 minute interval. Pt both internally and externally distracted today. He did recall this therapist's face and name upon arrival, however intermittent Mod A verbal cues for  recall of conversational topics throughout session. Pt did require intermittent verbal cues for reorientation during session due to fluctuating confusion - pt asking if he had left the hospital last night. Overall Max A multimodal cues required for functional problem solving use of bed controls for repositioning to relieve neck pain. Pt with increasing fatigue throughout session. Pt left laying in bed with alarm set and needs within reach. Continue per current plan of care.          Pain Pain Assessment Pain Scale: 0-10 Pain Score: 4  Pain Type: Acute pain Pain Location: Neck Pain Orientation: Mid Pain Descriptors / Indicators: Aching Pain Onset: On-going Pain Intervention(s): RN made aware;Repositioned Multiple Pain Sites: No  Therapy/Group: Individual Therapy  Arbutus Leas 12/17/2019, 7:10 AM

## 2019-12-17 NOTE — Progress Notes (Signed)
Orthopedic Tech Progress Note Patient Details:  Dennis Mercado Jul 09, 1980 [id number] Called [facility] for Rehab Combination (WHO/ Cock-up/ PRAFO.) Patient ID: Dennis Mercado, male   DOB: 1980-08-24, 39 y.o.   MRN: [id number]   [name] 12/17/2019, 9:50 AM

## 2019-12-17 NOTE — Progress Notes (Addendum)
Sat in patient's room for at least 30 minutes talking. During that time, patient was clam & stated a decrease in anxiety & pain level. Patient is anxious & can not seem to fall asleep. He was talking about taking a shower & getting a bath but was discouraged against it at this time due to therapy activities.He also asked for something else for sleep & mentioned xanax. Informed the patient that the physician would be informed of his insomnia & asked for something else to help him sleep. He stated that the melatonin did nothing & has been up until this time, very restless & making multiple requests back & forth with staff.  He does not remember having therapy at all yesterday or that he was given any of his medication. His topics seem to interchange. He does remember not being able to walk when his stroke symptoms began & that his wife told him to stay down while she called 911.He did know that he had a stroke & stated that he was having a lot of indigestion lately prior to the stroke. He was self medicating for the indigestion but did not know that the indigestion was a symptom of hypertension when he later researched it. Different types of strokes & symptoms were discussed. He also stated that his father passed due to a stroke. While we talked, he did not ask for assistance or mention a shower again until the nurse left the room. He asked for a heating pad earlier for the back of his neck & back & was given it. A k pad was ordered. By the time the kpad came, patient was asleep at this time. When he wakes, the kpad will be placed. No acute distress noted. Mews protocol was completed & his temperature was WNL on last vitals check. BP was a little elevated but at the time patient was anxious & restless. He was given tylenol for head pain that radiates to the back of his neck with relief. Will continue to monitor

## 2019-12-17 NOTE — IPOC Note (Signed)
Overall Plan of Care [facility]) Patient Details Name: Dennis Mercado MRN: [id number] DOB: 1981-01-01  Admitting Diagnosis: <principal problem not specified>  Hospital Problems: Active Problems:   ICH (intracerebral hemorrhage) ([place])     Functional Problem List: Nursing Motor, Sensory, Safety, Bladder, Medication Management  PT Balance, Perception, Behavior, Safety, Sensory, Edema, Motor, Skin Integrity, Endurance, Nutrition, Pain  OT Balance, Perception, Cognition, Endurance, Motor, Vision, Sensory, Safety  SLP Cognition, Nutrition  TR         Basic ADL's: OT Eating, Grooming, Bathing, Dressing, Toileting     Advanced  ADL's: OT       Transfers: PT Bed Mobility, Bed to Chair, Car, Manufacturing systems engineer, Metallurgist: PT Ambulation, Emergency planning/management officer, Stairs     Additional Impairments: OT Fuctional Use of Upper Extremity  SLP Swallowing, Social Cognition   Problem Solving, Memory, Attention, Awareness  TR      Anticipated Outcomes Item Anticipated Outcome  Self Feeding supervision  Swallowing  Supervision A   Basic self-care  min assist  Toileting  min assist   Bathroom Transfers min assist  Bowel/Bladder  cont x 2, min assist  Transfers  min assist  Locomotion  TBD if will be functional ambulator upon discharge  Communication     Cognition  Supervision A  Pain  less than 3  Safety/Judgment  cues/reminders   Therapy Plan: PT Intensity: Minimum of 1-2 x/day ,45 to 90 minutes PT Frequency: 5 out of 7 days PT Duration Estimated Length of Stay: ~4 weeks OT Intensity: Minimum of 1-2 x/day, 45 to 90 minutes OT Frequency: 5 out of 7 days OT Duration/Estimated Length of Stay: 21-23 days SLP Intensity: Minumum of 1-2 x/day, 30 to 90 minutes SLP Frequency: 3 to 5 out of 7 days SLP Duration/Estimated Length of Stay: 3-4 weeks   Due to the current state of emergency, patients may not be receiving their 3-hours of Medicare-mandated therapy.    Team Interventions: Nursing Interventions Patient/Family Education, Bladder Management, Bowel Management, Discharge Planning, Disease Management/Prevention, Medication Management  PT interventions Ambulation/gait training, Community reintegration, DME/adaptive equipment instruction, Neuromuscular re-education, Psychosocial support, Stair training, UE/LE Strength taining/ROM, Wheelchair propulsion/positioning, Training and development officer, Discharge planning, Functional electrical stimulation, Pain management, Skin care/wound management, UE/LE Coordination activities, Therapeutic Activities, Cognitive remediation/compensation, Disease management/prevention, Functional mobility training, Patient/family education, Splinting/orthotics, Therapeutic Exercise, Visual/perceptual remediation/compensation  OT Interventions Balance/vestibular training, Cognitive remediation/compensation, Community reintegration, Engineer, drilling, Discharge planning, Disease mangement/prevention, Functional mobility training, Functional electrical stimulation, Pain management, Self Care/advanced ADL retraining, Therapeutic Activities, UE/LE Coordination activities, Patient/family education, Therapeutic Exercise, Visual/perceptual remediation/compensation, Neuromuscular re-education, Splinting/orthotics, UE/LE Strength taining/ROM, Wheelchair propulsion/positioning, Psychosocial support  SLP Interventions Cognitive remediation/compensation, Cueing hierarchy, Dysphagia/aspiration precaution training, Functional tasks, Patient/family education, Therapeutic Activities, Internal/external aids, Environmental controls  TR Interventions    SW/CM Interventions Discharge Planning, Psychosocial Support, Patient/Family Education   Barriers to Discharge MD  Medical stability and Medication compliance  Nursing      PT Inaccessible home environment, Decreased caregiver support, Home environment access/layout, Medical stability,  Neurogenic Bowel & Bladder, Behavior, Nutrition means, Lack of/limited family support    OT      SLP      SW Lack of/limited family support, Decreased caregiver support     Team Discharge Planning: Destination: PT-Home ,OT- Home , SLP-Home Projected Follow-up: PT-24 hour supervision/assistance, Outpatient PT, OT-  Outpatient OT, 24 hour supervision/assistance, SLP-Outpatient SLP, 24 hour supervision/assistance, Home Health SLP Projected Equipment Needs: PT-To be determined, OT- To be determined, SLP-None  recommended by SLP Equipment Details: PT- , OT-  Patient/family involved in discharge planning: PT- Patient,  OT-Family member/caregiver, SLP-Patient  MD ELOS: 21-25d Medical Rehab Prognosis:  Good Assessment:  39 year old right-handed male with documented history of hypertension on no current antihypertensive medications.  History taken from chart review and wife due to cognition.  Patient lives with spouse.  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  1 level home one-step to entry.  He presented on 12/06/2019 with left hemiparesis and AMS during sexual activity.  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  Per report, measuring 3.6 x 2.8 x 2.6 cm.  Hemorrhage extends into the ventricles and could be seen in the third and fourth ventricles without hydrocephalus.  Local mass-effect without midline shift.  CT angiogram of head and neck normal variant CTA circle of Willis without significant proximal stenosis aneurysm or branch vessel occlusion.  Admission chemistries potassium 3.2, creatinine 1.52, urine drug screen positive marijuana.  Echocardiogram with ejection fraction of 65%, no wall motion abnormalities.  EEG negative for seizures.  Carotid Dopplers unremarkable.  Venous Dopplers upper extremity negative for DVT.  Follow-up CT, personally reviewed, stable hemorrhage.  Unchanged size of right thalamic intraparenchymal hematoma with mild surrounding  edema.  Hospital course further complicated by fevers presumed to be secondary to large left lung opacity concerning for pneumonia.  He completed course of Unasyn.  Dysphagia #2 thin liquids.  Cleviprex ongoing for blood pressure control since discontinued.  He was cleared to begin subcutaneous heparin for DVT prophylaxis 12/11/2019.  Therapy evaluations completed and patient was admitted for a comprehensive rehab program   Now requiring 24/7 Rehab RN,MD, as well as CIR level PT, OT and SLP.  Treatment team will focus on ADLs and mobility with goals set at min A See Team Conference Notes for weekly updates to the plan of care

## 2019-12-18 ENCOUNTER — Inpatient Hospital Stay (HOSPITAL_COMMUNITY): Payer: BC Managed Care – PPO | Admitting: Speech Pathology

## 2019-12-18 ENCOUNTER — Inpatient Hospital Stay (HOSPITAL_COMMUNITY): Payer: BC Managed Care – PPO | Admitting: Physical Therapy

## 2019-12-18 ENCOUNTER — Inpatient Hospital Stay (HOSPITAL_COMMUNITY): Payer: BC Managed Care – PPO | Admitting: Occupational Therapy

## 2019-12-18 LAB — URINALYSIS, ROUTINE W REFLEX MICROSCOPIC
Bilirubin Urine: NEGATIVE
Glucose, UA: NEGATIVE mg/dL
Hgb urine dipstick: NEGATIVE
Ketones, ur: NEGATIVE mg/dL
Leukocytes,Ua: NEGATIVE
Nitrite: NEGATIVE
Protein, ur: NEGATIVE mg/dL
Specific Gravity, Urine: 1.02 (ref 1.005–1.030)
pH: 5 (ref 5.0–8.0)

## 2019-12-18 NOTE — Progress Notes (Signed)
Occupational Therapy Session Note  Patient Details  Name: Dennis Mercado MRN: [id number] Date of Birth: Nov 14, 1980  Today's Date: 12/18/2019 OT Individual Time: 1003-1057 and 1449-1535 OT Individual Time Calculation (min): 54 min and 46 min  Short Term Goals: Week 1:  OT Short Term Goal 1 (Week 1): Pt will maintain static sitting balance EOB with min assist in preparation for selfcare or transfers. OT Short Term Goal 2 (Week 1): Pt will complete bathing sit to stand with mod assist from supported position. OT Short Term Goal 3 (Week 1): Pt will complete UB dressing with min assist following hemi dressing techniques. OT Short Term Goal 4 (Week 1): Pt will complete toilet transfer with mod assist squat pivot to drop arm commode. OT Short Term Goal 5 (Week 1): Pt will return demonstrate self PROM exercises for the LUE during session with min assist following handout.  Skilled Therapeutic Interventions/Progress Updates:    Pt greeted in the TIS, appearing very lethargic and needing increased time and encouragement to participate in tx due to lethargy. Started session by completing UB bathing/dressing tasks w/c level at the sink, chair placed in neutral position to work on trunk control. Pt with multiple Lt LOBs during dynamic tasks, verbal and manual cuing to increase awareness of midline. Instruction provided for adaptive techniques to doff hoodie and shirt, max vcs to pull clothing fully off of his Lt arm. Pt easily distractible with decreased attention to task. HOH for incorporating Lt UE during bathing and grooming tasks. Before donning shirt pt reported needing to urinate. +2 stand pivot<elevated toilet. Pt given increased time, thought he had to void B+B but ultimately unable to void either. The same assist required for stand pivot<TIS, Lt knee blocked with manual facilitation for movement. While sitting at the sink, increased time and education provided on hemi techniques for pt to don his shirt at  max level of independence. OT then printed him off a self ROM packet, showed both pt and spouse Dennis Mercado with plans to review packet together during PM session with this OT. Pt was left in the TIS with all needs within reach, reclined for safety, safety belt fastened.    2nd Session 1:1 tx (46 min) Pt greeted in bed, asleep, easily woken. Spouse Dennis Mercado present. Bed was placed in chair position and then tx focus was placed on pt/caregiver education. Guided pt through his self ROM packet while explaining techniques to Dennis Mercado. Pt required max cuing for sustained attention to task due to distractibility. He also needed mod facilitation at times during exercises which Dennis Mercado reported she could provide. At end of session pt was positioned in sidelying on his Rt side using props as he tends to lean towards the Lt, putting heavy pressure on the Lt shoulder. Discussed with Dennis Mercado safety concerns about this and she was agreeable to Rt sidelying. Left him with all needs within reach and bed alarm set.   Therapy Documentation Precautions:  Precautions Precautions: Fall Precaution Comments: L UE/LE severe paresis with impaired sensation; impaired safety awareness Restrictions Weight Bearing Restrictions: No Vital Signs: Therapy Vitals Temp: 98.5 F (36.9 C) Temp Source: Oral Pulse Rate: 85 Resp: 14 BP: 130/85 Patient Position (if appropriate): Lying Oxygen Therapy SpO2: 100 % O2 Device: Room Air Pain: Pain Assessment Pain Scale: 0-10 Pain Score: 8  Pain Type: Acute pain Pain Location: Head Pain Descriptors / Indicators: Headache Pain Onset: On-going Pain Intervention(s): RN made aware ADL: ADL Eating: Moderate assistance Where Assessed-Eating: Bed level Grooming: Moderate assistance Where  Assessed-Grooming: Bed level Upper Body Bathing: Maximal assistance Where Assessed-Upper Body Bathing: Edge of bed Lower Body Bathing: Maximal assistance Where Assessed-Lower Body Bathing: Edge of bed, Bed  level Upper Body Dressing: Maximal assistance Where Assessed-Upper Body Dressing: Edge of bed Lower Body Dressing: Maximal assistance Where Assessed-Lower Body Dressing: Edge of bed Toileting: Dependent Where Assessed-Toileting: Bedside Commode Toilet Transfer: Dependent Toilet Transfer Method: Stand pivot Toilet Transfer Equipment: Bedside commode      Therapy/Group: Individual Therapy  Abigail Marsiglia A Lenda Baratta 12/18/2019, 4:06 PM

## 2019-12-18 NOTE — Progress Notes (Signed)
Physical Therapy Session Note  Patient Details  Name: Dennis Mercado MRN: [id number] Date of Birth: 03/11/1981  Today's Date: 12/18/2019 PT Individual Time: 0805-0905 PT Individual Time Calculation (min): 60 min   Short Term Goals: Week 1:  PT Short Term Goal 1 (Week 1): Pt will perform supine<>sit with mod assist PT Short Term Goal 2 (Week 1): Pt will perform bed<>chair transfer with max assist (not using lift equipment) PT Short Term Goal 3 (Week 1): Pt will initiate gait training PT Short Term Goal 4 (Week 1): Pt will participate in sitting balance assessment  Skilled Therapeutic Interventions/Progress Updates:    Pt received supine in bed with RN present for morning medications. Pt agreeable to therapy session. Pt noted to have been incontinent of bladder and small BM - rolling R/L with mod assist during total assist LB clothing management and peri-care. Supine>sitting R EOB max assist for B LE management and trunk upright - limited use of R UE to assist with trunk upright. Sitting EOB with min assist during static sitting - repeated cuing for attention to midline orientation and prevent L posterolateral LOB. Sitting EOB min/mod assist for balance while +2 assist donned B LE thigh high TEDs, shorts, and shoes max assist. Sit>stand from EOB +2 mod assist for lifting and mod assist for pulling pants over hips, pt using R UE to assist - demonstrates moderately strong L lean when standing. L squat pivot EOB>w/c mod assist for lifting and pivoting hips.  Transported to/from gym in w/c for time management and energy conservation. Donned L LE ankle DF assist ACE wrap. Gait training ~30ft at R hallway rail mod assist for balance and total assist for L LE management during swing phase due to lack of hip/knee flexion activation and max/total assist to block knee buckle during stance. Transitioned to 3 Musketeer support ambulating ~74ft x3 trials (seated break between each) +2 mod assist - continues to  require total assist for L LE swing phase mechanics and max assist to prevent knee buckle in stance - pt demonstrates improved gait via this UE support compared to when using hallway rail as he has improved R/L lateral weight shifting on stance limb. Transported back to room and pt agreeable to remain sitting tilted back in TIS w/c - left with needs in reach and seat belt alarm on.  Therapy Documentation Precautions:  Precautions Precautions: Fall Precaution Comments: L UE/LE severe paresis with impaired sensation; impaired safety awareness Restrictions Weight Bearing Restrictions: No  Pain: No reports of pain throughout session.   Therapy/Group: Individual Therapy  Tawana Scale , PT, DPT, CSRS  12/18/2019, 7:44 AM

## 2019-12-18 NOTE — Progress Notes (Signed)
Speech Language Pathology Daily Session Note  Patient Details  Name: Dennis Mercado MRN: [id number] Date of Birth: 1980/10/17  Today's Date: 12/18/2019 SLP Individual Time: 0102-7253 SLP Individual Time Calculation (min): 30 min  Short Term Goals: Week 1: SLP Short Term Goal 1 (Week 1): Pt will consume current diet with efficient mastication and oral clearance and min overt s/sx aspiration with Min A verbal/visual cues for use of compensatory swallow strategies to minimize anterior loss and oral clearance. SLP Short Term Goal 2 (Week 1): Pt will demonstrate efficient mastication and oral clearance of Dys 3 textures over 3 sessions prior to advancement. SLP Short Term Goal 3 (Week 1): Pt will demonstrate ability to problem solve basic situations with Mod A verbal/visual cues. SLP Short Term Goal 4 (Week 1): Pt will sustain attention to functional tasks with Mod A verbal/visual cues. SLP Short Term Goal 5 (Week 1): Pt will recall new and/or daily information with Mod A cues for use of strategies/aids. SLP Short Term Goal 6 (Week 1): Pt will detect functional errors with Max A multimodal cues.  Skilled Therapeutic Interventions:  Pt was seen for skilled ST targeting cognitive and dysphagia goals.  Upon arrival pt had complaints of 8/10 headache but was willing to participate in therapy.  Wife reports that the nurse had already been called but they were waiting on a response.  Pt also complained of neck pain which is likely due to right gaze preference with neck flexed to the right.  Pt was repositioned and heating pad was applied.  SLP facilitated the session with a trial snack of dys 3 textures and thin liquids to continue working towards diet progression.  Pt consumed advanced solids with min cues to clear left sided buccal residue from the oral cavity post swallow.  No overt s/s of aspiration were evident with solids or liquids.  Throughout session pt needed mod assist verbal and visual cues to  rotate head in order to facilitate visually scanning past midline in order to locate things in his room or attend to therapist when standing on his left side.  Pt was pleasantly verbose and tangential during conversations with SLP and benefited from mod verbal cues for redirection of attention back to tasks.  Pt was left in bed with bed alarm set and wife at bedside.  RN re-alerted of pt's request for pain meds.  Continue per current plan of care.    Pain Pain Assessment Pain Scale: 0-10 Pain Score: 8  Pain Type: Acute pain Pain Location: Head Pain Descriptors / Indicators: Headache Pain Onset: On-going Pain Intervention(s): RN made aware  Therapy/Group: Individual Therapy  Shauntia Levengood, Selinda Orion 12/18/2019, 2:46 PM

## 2019-12-18 NOTE — Progress Notes (Signed)
[facility] PHYSICAL MEDICINE & REHABILITATION PROGRESS NOTE   Subjective/Complaints:  Seen in PT no further elevated temps , dopplers neg   ROS- neg CP, SOB, N/V/D   Objective:   DG Chest 2 View  Result Date: 12/16/2019 CLINICAL DATA:  Fever EXAM: CHEST - 2 VIEW COMPARISON:  12/11/2019 FINDINGS: The heart size and mediastinal contours are within normal limits. Both lungs are clear. The visualized skeletal structures are unremarkable. IMPRESSION: No active cardiopulmonary disease. Electronically Signed   By: Rolm Baptise M.D.   On: 12/16/2019 16:19   VAS [place] LOWER EXTREMITY VENOUS (DVT)  Result Date: 12/17/2019  Lower Venous DVTStudy Indications: Swelling.  Performing Technologist: Abram Sander RVS  Examination Guidelines: A complete evaluation includes B-mode imaging, spectral Doppler, color Doppler, and power Doppler as needed of all accessible portions of each vessel. Bilateral testing is considered an integral part of a complete examination. Limited examinations for reoccurring indications may be performed as noted. The reflux portion of the exam is performed with the patient in reverse Trendelenburg.  +---------+---------------+---------+-----------+----------+--------------+ RIGHT    CompressibilityPhasicitySpontaneityPropertiesThrombus Aging +---------+---------------+---------+-----------+----------+--------------+ CFV      Full           Yes      Yes                                 +---------+---------------+---------+-----------+----------+--------------+ SFJ      Full                                                        +---------+---------------+---------+-----------+----------+--------------+ FV Prox  Full                                                        +---------+---------------+---------+-----------+----------+--------------+ FV Mid   Full                                                         +---------+---------------+---------+-----------+----------+--------------+ FV DistalFull                                                        +---------+---------------+---------+-----------+----------+--------------+ PFV      Full                                                        +---------+---------------+---------+-----------+----------+--------------+ POP      Full           Yes      Yes                                 +---------+---------------+---------+-----------+----------+--------------+  PTV      Full                                                        +---------+---------------+---------+-----------+----------+--------------+ PERO     Full                                                        +---------+---------------+---------+-----------+----------+--------------+   +---------+---------------+---------+-----------+----------+--------------+ LEFT     CompressibilityPhasicitySpontaneityPropertiesThrombus Aging +---------+---------------+---------+-----------+----------+--------------+ CFV      Full           Yes      Yes                                 +---------+---------------+---------+-----------+----------+--------------+ SFJ      Full                                                        +---------+---------------+---------+-----------+----------+--------------+ FV Prox  Full                                                        +---------+---------------+---------+-----------+----------+--------------+ FV Mid   Full                                                        +---------+---------------+---------+-----------+----------+--------------+ FV DistalFull                                                        +---------+---------------+---------+-----------+----------+--------------+ PFV      Full                                                         +---------+---------------+---------+-----------+----------+--------------+ POP      Full           Yes      Yes                                 +---------+---------------+---------+-----------+----------+--------------+ PTV      Full                                                        +---------+---------------+---------+-----------+----------+--------------+  PERO     Full                                                        +---------+---------------+---------+-----------+----------+--------------+     Summary: BILATERAL: - No evidence of deep vein thrombosis seen in the lower extremities, bilaterally. -No evidence of popliteal cyst, bilaterally.   *See table(s) above for measurements and observations. Electronically signed by Monica Martinez MD on 12/17/2019 at 6:29:48 PM.    Final    Recent Labs    12/16/19 0237 12/17/19 0537  WBC 12.7* 13.0*  HGB 12.4* 12.7*  HCT 39.0 39.9  PLT 330 394   Recent Labs    12/16/19 0237  NA 141  K 3.5  CL 112*  CO2 22  GLUCOSE 134*  BUN 28*  CREATININE 1.57*  CALCIUM 8.8*    Intake/Output Summary (Last 24 hours) at 12/18/2019 1052 Last data filed at 12/18/2019 0739 Gross per 24 hour  Intake 420 ml  Output 525 ml  Net -105 ml     Physical Exam: Vital Signs Blood pressure (!) 130/99, pulse 79, temperature 98.5 F (36.9 C), resp. rate 19, height 5\' 7"  (1.702 m), weight 71.1 kg, SpO2 100 %.   General: No acute distress Mood and affect are appropriate Heart: Regular rate and rhythm no rubs murmurs or extra sounds Lungs: Clear to auscultation, breathing unlabored, no rales or wheezes Abdomen: Positive bowel sounds, soft nontender to palpation, nondistended Extremities: No clubbing, cyanosis, or edema Skin: No evidence of breakdown, no evidence of rash   Neurologic: Cranial nerves II through XII intact, motor strength is 5/5 in right an d 0/5 Left deltoid, bicep, tricep, grip, hip flexor, knee extensors, ankle  dorsiflexor and plantar flexor Sensory exam absent sensation to light touch and proprioception in LEFT upper and lower extremities  Musculoskeletal: no pain with UE or LE ROM     Assessment/Plan: 1. 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  Physiatrist is providing close team supervision and 24 hour management of active medical problems listed below.  Physiatrist and rehab team continue to assess barriers to discharge/monitor patient progress toward functional and medical goals  Care Tool:  Bathing    Body parts bathed by patient: Chest, Abdomen, Right upper leg, Left upper leg, Face, Right arm (supine to sit)   Body parts bathed by helper: Left arm, Front perineal area, Buttocks, Left lower leg, Right lower leg     Bathing assist Assist Level: Maximal Assistance - Patient 24 - 49%     Upper Body Dressing/Undressing Upper body dressing Upper body dressing/undressing activity did not occur (including orthotics): N/A What is the patient wearing?: Pull over shirt    Upper body assist Assist Level: Dependent - Patient 0%    Lower Body Dressing/Undressing Lower body dressing      What is the patient wearing?: Incontinence brief     Lower body assist Assist for lower body dressing: Dependent - Patient 0%     Toileting Toileting    Toileting assist Assist for toileting: Maximal Assistance - Patient 25 - 49% (urinal)     Transfers Chair/bed transfer  Transfers assist  Chair/bed transfer activity did not occur: N/A  Chair/bed transfer assist level: Dependent -  mechanical lift (stedy)     Locomotion Ambulation   Ambulation assist   Ambulation activity did not occur: Safety/medical concerns          Walk 10 feet activity   Assist  Walk 10 feet activity did not occur: Safety/medical concerns        Walk 50 feet activity   Assist Walk  50 feet with 2 turns activity did not occur: Safety/medical concerns         Walk 150 feet activity   Assist Walk 150 feet activity did not occur: Safety/medical concerns         Walk 10 feet on uneven surface  activity   Assist Walk 10 feet on uneven surfaces activity did not occur: Safety/medical concerns         Wheelchair     Assist Will patient use wheelchair at discharge?: No (No PT lomg term goals set)             Wheelchair 50 feet with 2 turns activity    Assist            Wheelchair 150 feet activity     Assist          Blood pressure (!) 130/99, pulse 79, temperature 98.5 F (36.9 C), resp. rate 19, height 5\' 7"  (1.702 m), weight 71.1 kg, SpO2 100 %.  Medical Problem List and Plan: 1.  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  Antithrombotics: -DVT/anticoagulation: Subcutaneous heparin initiated 12/11/2019,dopplers neg             -antiplatelet therapy: N/A 3. Pain Management: Tylenol as needed 4. Mood: Provide emotional support             -antipsychotic agents: N/A 5. Neuropsych: This patient is?  Fully capable of making decisions on his own behalf. 6. Skin/Wound Care: Routine skin checks 7. Fluids/Electrolytes/Nutrition: Routine in and outs.  CMP ordered. 8.  Post stroke dysphagia.  Dysphagia #2 thin liquids.  Follow-up speech therapy.  Advance diet as tolerated 9.  Left lung pneumonia.  Intravenous Unasyn completed 10.  Hypertension.  Norvasc 10 mg daily, Coreg 25 mg twice daily, clonidine 0.3 mg twice daily, hydralazine 100 mg every 8 hours, lisinopril 10 mg twice daily.   Vitals:   12/17/19 2150 12/18/19 0433  BP: 137/85 (!) 130/99  Pulse: 84 79  Resp: 19 19  Temp: 98 F (36.7 C) 98.5 F (36.9 C)  SpO2: 100% 100%  BPs improving 7/3              Monitor with increased mobility 11.  Urine drug screen positive marijuana.  Counseled on appropriate  12.  Pre renal azotemia worsening  will enc fluid, give IVF x1 bolus 13.  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  This will likely occur more often at night.  Monitor for signs of alcohol withdrawal although drinking pattern was mainly on the weekends.  14.  Insomnia trial Ambien 15.  Leukocytosis Fevers x 1 but no recurrence , monitor off abx Ucx in process LOS: 3 days A FACE TO FACE EVALUATION WAS PERFORMED  [name] 12/18/2019, 10:52 AM

## 2019-12-19 NOTE — Progress Notes (Signed)
[facility] PHYSICAL MEDICINE & REHABILITATION PROGRESS NOTE   Subjective/Complaints:  No issues overnight slept better with Ambien, states that he will try walking today we discussed that he is only to walk with physical therapy or occupational therapy  ROS- neg CP, SOB, N/V/D   Objective:   VAS [place] LOWER EXTREMITY VENOUS (DVT)  Result Date: 12/17/2019  Lower Venous DVTStudy Indications: Swelling.  Performing Technologist: Abram Sander RVS  Examination Guidelines: A complete evaluation includes B-mode imaging, spectral Doppler, color Doppler, and power Doppler as needed of all accessible portions of each vessel. Bilateral testing is considered an integral part of a complete examination. Limited examinations for reoccurring indications may be performed as noted. The reflux portion of the exam is performed with the patient in reverse Trendelenburg.  +---------+---------------+---------+-----------+----------+--------------+ RIGHT    CompressibilityPhasicitySpontaneityPropertiesThrombus Aging +---------+---------------+---------+-----------+----------+--------------+ CFV      Full           Yes      Yes                                 +---------+---------------+---------+-----------+----------+--------------+ SFJ      Full                                                        +---------+---------------+---------+-----------+----------+--------------+ FV Prox  Full                                                        +---------+---------------+---------+-----------+----------+--------------+ FV Mid   Full                                                        +---------+---------------+---------+-----------+----------+--------------+ FV DistalFull                                                        +---------+---------------+---------+-----------+----------+--------------+ PFV      Full                                                         +---------+---------------+---------+-----------+----------+--------------+ POP      Full           Yes      Yes                                 +---------+---------------+---------+-----------+----------+--------------+ PTV      Full                                                        +---------+---------------+---------+-----------+----------+--------------+  PERO     Full                                                        +---------+---------------+---------+-----------+----------+--------------+   +---------+---------------+---------+-----------+----------+--------------+ LEFT     CompressibilityPhasicitySpontaneityPropertiesThrombus Aging +---------+---------------+---------+-----------+----------+--------------+ CFV      Full           Yes      Yes                                 +---------+---------------+---------+-----------+----------+--------------+ SFJ      Full                                                        +---------+---------------+---------+-----------+----------+--------------+ FV Prox  Full                                                        +---------+---------------+---------+-----------+----------+--------------+ FV Mid   Full                                                        +---------+---------------+---------+-----------+----------+--------------+ FV DistalFull                                                        +---------+---------------+---------+-----------+----------+--------------+ PFV      Full                                                        +---------+---------------+---------+-----------+----------+--------------+ POP      Full           Yes      Yes                                 +---------+---------------+---------+-----------+----------+--------------+ PTV      Full                                                         +---------+---------------+---------+-----------+----------+--------------+ PERO     Full                                                        +---------+---------------+---------+-----------+----------+--------------+  Summary: BILATERAL: - No evidence of deep vein thrombosis seen in the lower extremities, bilaterally. -No evidence of popliteal cyst, bilaterally.   *See table(s) above for measurements and observations. Electronically signed by Monica Martinez MD on 12/17/2019 at 6:29:48 PM.    Final    Recent Labs    12/17/19 0537  WBC 13.0*  HGB 12.7*  HCT 39.9  PLT 394   No results for input(s): NA, K, CL, CO2, GLUCOSE, BUN, CREATININE, CALCIUM in the last 72 hours.  Intake/Output Summary (Last 24 hours) at 12/19/2019 1123 Last data filed at 12/19/2019 0700 Gross per 24 hour  Intake 960 ml  Output 625 ml  Net 335 ml     Physical Exam: Vital Signs Blood pressure 124/83, pulse 82, temperature 98.2 F (36.8 C), resp. rate 18, height 5\' 7"  (1.702 m), weight 71.1 kg, SpO2 100 %.  General: No acute distress Mood and affect are appropriate Heart: Regular rate and rhythm no rubs murmurs or extra sounds Lungs: Clear to auscultation, breathing unlabored, no rales or wheezes Abdomen: Positive bowel sounds, soft nontender to palpation, nondistended Extremities: No clubbing, cyanosis, or edema Skin: No evidence of breakdown, no evidence of rash  Neurologic: Cranial nerves II through XII intact, motor strength is 5/5 in right an d 0/5 Left deltoid, bicep, tricep, grip, hip flexor, knee extensors, ankle dorsiflexor and plantar flexor Sensory exam absent sensation to light touch and proprioception in LEFT upper and lower extremities  Musculoskeletal: no pain with UE or LE ROM     Assessment/Plan: 1. 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  Physiatrist is providing  close team supervision and 24 hour management of active medical problems listed below.  Physiatrist and rehab team continue to assess barriers to discharge/monitor patient progress toward functional and medical goals  Care Tool:  Bathing    Body parts bathed by patient: Chest, Abdomen, Right upper leg, Left upper leg, Face, Right arm (supine to sit)   Body parts bathed by helper: Left arm, Front perineal area, Buttocks, Left lower leg, Right lower leg     Bathing assist Assist Level: Maximal Assistance - Patient 24 - 49%     Upper Body Dressing/Undressing Upper body dressing Upper body dressing/undressing activity did not occur (including orthotics): N/A What is the patient wearing?: Pull over shirt    Upper body assist Assist Level: Maximal Assistance - Patient 25 - 49%    Lower Body Dressing/Undressing Lower body dressing      What is the patient wearing?: Incontinence brief     Lower body assist Assist for lower body dressing: Total Assistance - Patient < 25%     Toileting Toileting    Toileting assist Assist for toileting: Set up assist Assistive Device Comment: urinal (assist placing urinal, pt will hold)   Transfers Chair/bed transfer  Transfers assist  Chair/bed transfer activity did not occur: N/A  Chair/bed transfer assist level: 2 Helpers     Locomotion Ambulation   Ambulation assist   Ambulation activity did not occur: Safety/medical concerns  Assist level: 2 helpers Assistive device: Other (comment) (3 musketeers) Max distance: 70ft   Walk 10 feet activity   Assist  Walk 10 feet activity did not occur: Safety/medical concerns  Assist level: 2 helpers Assistive device: Other (comment) (3 musketeers)   Walk 50 feet activity   Assist Walk 50 feet with 2 turns activity did not occur: Safety/medical  concerns         Walk 150 feet activity   Assist Walk 150 feet activity did not occur: Safety/medical concerns         Walk 10  feet on uneven surface  activity   Assist Walk 10 feet on uneven surfaces activity did not occur: Safety/medical concerns         Wheelchair     Assist Will patient use wheelchair at discharge?: No (No PT lomg term goals set)             Wheelchair 50 feet with 2 turns activity    Assist            Wheelchair 150 feet activity     Assist          Blood pressure 124/83, pulse 82, temperature 98.2 F (36.8 C), resp. rate 18, height 5\' 7"  (1.702 m), weight 71.1 kg, SpO2 100 %.  Medical Problem List and Plan: 1.  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  Antithrombotics: -DVT/anticoagulation: Subcutaneous heparin initiated 12/11/2019,dopplers neg             -antiplatelet therapy: N/A 3. Pain Management: Tylenol as needed 4. Mood: Provide emotional support             -antipsychotic agents: N/A 5. Neuropsych: This patient is?  Fully capable of making decisions on his own behalf. 6. Skin/Wound Care: Routine skin checks 7. Fluids/Electrolytes/Nutrition: Routine in and outs.  CMP ordered. 8.  Post stroke dysphagia.  Dysphagia #2 thin liquids.  Follow-up speech therapy.  Advance diet as tolerated 9.  Left lung pneumonia.  Intravenous Unasyn completed 10.  Hypertension.  Norvasc 10 mg daily, Coreg 25 mg twice daily, clonidine 0.3 mg twice daily, hydralazine 100 mg every 8 hours, lisinopril 10 mg twice daily.   Vitals:   12/18/19 1952 12/19/19 0332  BP: 134/84 124/83  Pulse: 82 82  Resp: 18 18  Temp: 98.1 F (36.7 C) 98.2 F (36.8 C)  SpO2: 100% 100%  BPs improving 7/4             Monitor with increased mobility 11.  Urine drug screen positive marijuana.  Counseled on appropriate  12.  Pre renal azotemia worsening  will enc fluid, give IVF x1 bolus 13.  Cognitive deficits  related stroke with visual illusions.  This will likely occur more often at night.  Monitor for signs of alcohol withdrawal although drinking pattern was mainly on the weekends.  14.  Insomnia trial Ambien 15.  Leukocytosis Fevers x 1 but no recurrence , monitor off abx Ucx in process LOS: 4 days A FACE TO FACE EVALUATION WAS PERFORMED  [name] 12/19/2019, 11:23 AM

## 2019-12-19 NOTE — Plan of Care (Signed)
  Problem: RH SAFETY Goal: RH STG ADHERE TO SAFETY PRECAUTIONS W/ASSISTANCE/DEVICE Description: STG Adhere to Safety Precautions With cues/reminders Assistance/Device. Outcome: Progressing   Problem: RH KNOWLEDGE DEFICIT Goal: RH STG INCREASE KNOWLEDGE OF HYPERTENSION Description: Pt/family will be able to demonstrate knowledge of HTN management with medications and diet precautions and ways to monitor blood pressure with min assist using handouts/booklets provided by staff Outcome: Progressing Goal: RH STG INCREASE KNOWLEDGE OF STROKE PROPHYLAXIS Description: Pt/family will be able to demonstrate knowledge of stroke prevention with medications and diet precautions and ways to monitor signs/symptoms of stroke with min assist using handouts/booklets provided by staff Outcome: Progressing

## 2019-12-20 ENCOUNTER — Inpatient Hospital Stay (HOSPITAL_COMMUNITY): Payer: BC Managed Care – PPO | Admitting: Occupational Therapy

## 2019-12-20 ENCOUNTER — Inpatient Hospital Stay (HOSPITAL_COMMUNITY): Payer: BC Managed Care – PPO

## 2019-12-20 ENCOUNTER — Inpatient Hospital Stay (HOSPITAL_COMMUNITY): Payer: BC Managed Care – PPO | Admitting: Speech Pathology

## 2019-12-20 DIAGNOSIS — G479 Sleep disorder, unspecified: Secondary | ICD-10-CM

## 2019-12-20 DIAGNOSIS — I69391 Dysphagia following cerebral infarction: Secondary | ICD-10-CM

## 2019-12-20 DIAGNOSIS — I10 Essential (primary) hypertension: Secondary | ICD-10-CM

## 2019-12-20 DIAGNOSIS — D72829 Elevated white blood cell count, unspecified: Secondary | ICD-10-CM

## 2019-12-20 DIAGNOSIS — R7401 Elevation of levels of liver transaminase levels: Secondary | ICD-10-CM

## 2019-12-20 DIAGNOSIS — I610 Nontraumatic intracerebral hemorrhage in hemisphere, subcortical: Secondary | ICD-10-CM

## 2019-12-20 DIAGNOSIS — R7989 Other specified abnormal findings of blood chemistry: Secondary | ICD-10-CM

## 2019-12-20 LAB — URINE CULTURE: Culture: 40000 — AB

## 2019-12-20 NOTE — Progress Notes (Signed)
Speech Language Pathology Daily Session Note  Patient Details  Name: Dennis Mercado MRN: [id number] Date of Birth: [birth year]  Today's Date: 12/20/2019 SLP Individual Time: 1000-1055 SLP Individual Time Calculation (min): 55 min  Short Term Goals: Week 1: SLP Short Term Goal 1 (Week 1): Pt will consume current diet with efficient mastication and oral clearance and min overt s/sx aspiration with Min A verbal/visual cues for use of compensatory swallow strategies to minimize anterior loss and oral clearance. SLP Short Term Goal 2 (Week 1): Pt will demonstrate efficient mastication and oral clearance of Dys 3 textures over 3 sessions prior to advancement. SLP Short Term Goal 3 (Week 1): Pt will demonstrate ability to problem solve basic situations with Mod A verbal/visual cues. SLP Short Term Goal 4 (Week 1): Pt will sustain attention to functional tasks with Mod A verbal/visual cues. SLP Short Term Goal 5 (Week 1): Pt will recall new and/or daily information with Mod A cues for use of strategies/aids. SLP Short Term Goal 6 (Week 1): Pt will detect functional errors with Max A multimodal cues.  Skilled Therapeutic Interventions: Skilled treatment session focused on cognitive goals. Upon arrival, patient requested to donn his jacket. Patient was able to verbalize how to donn it correctly but required Max verbal cues for attention to task. Patient was verbose with intermittent confabulation and was easily redirected. Patient asking questions about his medications, therefore, patient provided a patient with a list. Patient participated in a medication management task with Mod A verbal cues for problem solving, visual scanning, and error awareness. Patient left upright in the wheelchair with alarm on and all needs within reach. Continue with current plan of care.      Pain Pain Assessment Pain Scale: 0-10 Pain Score: 0-No pain  Therapy/Group: Individual Therapy  Dennis Mercado 12/20/2019, 12:25  PM

## 2019-12-20 NOTE — Progress Notes (Signed)
Physical Therapy Session Note  Patient Details  Name: Dennis Mercado MRN: [id number] Date of Birth: 10/09/1980  Today's Date: 12/20/2019 PT Individual Time: 1302-1405 PT Individual Time Calculation (min): 63 min   Short Term Goals: Week 1:  PT Short Term Goal 1 (Week 1): Pt will perform supine<>sit with mod assist PT Short Term Goal 2 (Week 1): Pt will perform bed<>chair transfer with max assist (not using lift equipment) PT Short Term Goal 3 (Week 1): Pt will initiate gait training PT Short Term Goal 4 (Week 1): Pt will participate in sitting balance assessment Week 2:    Week 3:     Skilled Therapeutic Interventions/Progress Updates:    PAIN Pt c/o discomfort in RUE w/exertion w/all activities, increased w/gait.  Repositioning and rest as needed.  Pt initially oob in wc and agreeable to treatment.  Wife at bedside and encouraging pt.  Pt transported to gym for session. Pt w/decreased attention to L, decreased attention to LUE positioning, decreased tendency to visually scan L/overall scanning delayed, pt w/noted saccades.  Pt SPT to mat w/mod assist of 1.  Initially leans moderately in sitting to L, able to self correct w/mirror and verbal cues for feedback.  Worked on midline orientation followed by reaching across midline to L w/return to midline w/verbal cueing and cga to max assist w/increased challenge, verbal cues used to increase reaction time due to delays responding to LOB.  Pt then worked on STS and static stand w/therapsit blocking L knee, mod assist, mirror/verbal/tactile cues for midline.  Stand/turn/tall kneeling w/step for UE wbing w/max assist of 2.  Once in tall kneeling able to work on core/pelvic/trunk control w/min to max assist of 1 and multimodal cues.  Pt is able to ID and maintain midline for short periods w/much cuing and cga and cues to attend to task.  Decreased attention and overcorrection anteriorly results in need for max assist for recovery.  Pt w/good  endurance for activity and responds well to tactile cueing to facilitate abdominal activation.  Tall kneeling to sitting on edge of mat w/max assist of 1, mod assist of 1. SPT to wc w/mod assist.  Pt transported to dayroom for Litegait trial.  Pt fitted w/trial L AFO for gait training. Pt total assist for donning harness and educated re use of Litegait/TM/AFO, pt agreeable stating "Lets do this". STS w/mod assist, steps up onto TM w/max assist.  Gait trials using AFO and max assist of 1 to advance LLE/stabilize hip/knee and second person to facilitate wt shifting as follows: .54mph-.3mph x 3 min  Rest in standing x 2-3 min .33mph-.3mph x 1.14min Pt requires heavy cueing for sequencing/attending to task/step length on R  max assist of 2 to back and step down from TM, stand to sit w/min assist to control ant wt shift w/transition.  Pt transported back to room at end of session. Pt left oob in wc handed off to nursing for transfer back to bed. Discussed treatment session w/wife.  Pt may benefit from LiteGait trial on level surfaces vs TM due to issues w/attention, delayed response time.  Did very well w/tall kneeling activity w/increased activation of core/hips, reponds to tactile facilitation.  Therapy Documentation Precautions:  Precautions Precautions: Fall Precaution Comments: L UE/LE severe paresis with impaired sensation; impaired safety awareness Restrictions Weight Bearing Restrictions: No   Therapy/Group: Individual Therapy  Callie Fielding, [place] 12/20/2019, 3:36 PM

## 2019-12-20 NOTE — Progress Notes (Signed)
Occupational Therapy Session Note  Patient Details  Name: Dennis Mercado MRN: [id number] Date of Birth: May 09, 1981  Today's Date: 12/20/2019 OT Individual Time: 1132-1203 OT Individual Time Calculation (min): 31 min    Short Term Goals: Week 1:  OT Short Term Goal 1 (Week 1): Pt will maintain static sitting balance EOB with min assist in preparation for selfcare or transfers. OT Short Term Goal 2 (Week 1): Pt will complete bathing sit to stand with mod assist from supported position. OT Short Term Goal 3 (Week 1): Pt will complete UB dressing with min assist following hemi dressing techniques. OT Short Term Goal 4 (Week 1): Pt will complete toilet transfer with mod assist squat pivot to drop arm commode. OT Short Term Goal 5 (Week 1): Pt will return demonstrate self PROM exercises for the LUE during session with min assist following handout.  Skilled Therapeutic Interventions/Progress Updates:    patient seated in TIS w/c, alert, pleasant and cooperative.  He denies pain but would like to get in bed - agrees to complete trunk mobility and balance/neuro motor re-ed during session to change position and return to w/c for lunch in upright position.  Completed seated midline and facilitation of left side trunk with good response to input - continues with lateral lean, midline orientation deficit and pushes but able to adjust with ongoing tactile cues, facilitation and verbal direction.  Sit pivot transfer to bed with min A after trunk control and midline activities.  Completed unsupported sitting trunk rotation, lateral lean and facilitation activities for left UE - occ LOB posteriorly with min A and cues to correct.  Reviewed strategies for eye mobility and focus.  Sit pivot transfer bed to w/c min/mod A.  He remained in w/c at close of session, tilted w/c, seat belt alarm set, telesitter in place, call bell in hand.    Therapy Documentation Precautions:  Precautions Precautions: Fall Precaution  Comments: L UE/LE severe paresis with impaired sensation; impaired safety awareness Restrictions Weight Bearing Restrictions: No   Therapy/Group: Individual Therapy  Carlos Levering 12/20/2019, 7:42 AM

## 2019-12-20 NOTE — Progress Notes (Signed)
[place] PHYSICAL MEDICINE & REHABILITATION PROGRESS NOTE   Subjective/Complaints: Patient seen sitting up in his chair this morning, working with therapies.  Wife at bedside.  He states he slept well overnight.  He notes burning pain in left upper and left lower extremity, but states that it is improving.  He states he wore his orthoses overnight.  Wife supplements history.  ROS: Denies CP, SOB, N/V/D   Objective:   No results found. No results for input(s): WBC, HGB, HCT, PLT in the last 72 hours. No results for input(s): NA, K, CL, CO2, GLUCOSE, BUN, CREATININE, CALCIUM in the last 72 hours.  Intake/Output Summary (Last 24 hours) at 12/20/2019 1547 Last data filed at 12/20/2019 1300 Gross per 24 hour  Intake 780 ml  Output 300 ml  Net 480 ml     Physical Exam: Vital Signs Blood pressure 113/77, pulse 66, temperature 97.9 F (36.6 C), temperature source Oral, resp. rate 16, height 5\' 7"  (1.702 m), weight 71.1 kg, SpO2 93 %. Constitutional: No distress . Vital signs reviewed. HENT: Normocephalic.  Atraumatic. Eyes: EOMI. No discharge. Cardiovascular: No JVD.  RRR. Respiratory: Normal effort.  No stridor.  Bilaterally clear to auscultation. GI: Non-distended. Skin: Warm and dry.  Intact. Psych: Normal mood.  Normal behavior. Musc: No edema in extremities.  No tenderness in extremities. Neurologic: Alert Motor:  LUE/LLE: 0/5 proximal to distal  Right lean  Assessment/Plan: 1. 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  Physiatrist is providing close team supervision and 24 hour management of active medical problems listed below.  Physiatrist and rehab team continue to assess barriers to discharge/monitor patient progress toward functional and medical goals  Care Tool:  Bathing    Body parts bathed by patient: Left arm, Chest, Abdomen, Front perineal area,  Right upper leg, Left upper leg, Face, Right lower leg   Body parts bathed by helper: Buttocks, Left lower leg, Right arm     Bathing assist Assist Level: Moderate Assistance - Patient 50 - 74% (sitting on shower seat)     Upper Body Dressing/Undressing Upper body dressing Upper body dressing/undressing activity did not occur (including orthotics): N/A What is the patient wearing?: Pull over shirt    Upper body assist Assist Level: Maximal Assistance - Patient 25 - 49%    Lower Body Dressing/Undressing Lower body dressing      What is the patient wearing?: Incontinence brief, Pants     Lower body assist Assist for lower body dressing: Maximal Assistance - Patient 25 - 49%     Toileting Toileting    Toileting assist Assist for toileting: Set up assist Assistive Device Comment: urinal (assist placing urinal, pt will hold)   Transfers Chair/bed transfer  Transfers assist  Chair/bed transfer activity did not occur: N/A  Chair/bed transfer assist level: Moderate Assistance - Patient 50 - 74%     Locomotion Ambulation   Ambulation assist   Ambulation activity did not occur: Safety/medical concerns  Assist level: 2 helpers Assistive device: Lite Gait Max distance: >17ft   Walk 10 feet activity   Assist  Walk 10 feet activity did not occur: Safety/medical concerns  Assist level: 2 helpers Assistive device: Lite Gait   Walk 50 feet activity   Assist Walk 50 feet with 2 turns activity did not occur: Safety/medical concerns  Assist level: 2 helpers Assistive device: Lite Gait    Walk 150 feet activity  Assist Walk 150 feet activity did not occur: Safety/medical concerns         Walk 10 feet on uneven surface  activity   Assist Walk 10 feet on uneven surfaces activity did not occur: Safety/medical concerns         Wheelchair     Assist Will patient use wheelchair at discharge?: No (No PT lomg term goals set)              Wheelchair 50 feet with 2 turns activity    Assist            Wheelchair 150 feet activity     Assist          Blood pressure 113/77, pulse 66, temperature 97.9 F (36.6 C), temperature source Oral, resp. rate 16, height 5\' 7"  (1.702 m), weight 71.1 kg, SpO2 93 %.  Medical Problem List and Plan: 1.  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  Antithrombotics: -DVT/anticoagulation: Subcutaneous heparin initiated 12/11/2019,dopplers neg              -antiplatelet therapy: N/A 3. Pain Management: Tylenol as needed 4. Mood: Provide emotional support             -antipsychotic agents: N/A 5. Neuropsych: This patient is?  Fully capable of making decisions on his own behalf. 6. Skin/Wound Care: Routine skin checks 7. Fluids/Electrolytes/Nutrition: Routine in and outs. 8.  Post stroke dysphagia.  Dysphagia #2 thin liquids.  Follow-up speech therapy.  Advance diet as tolerated 9.  Left lung pneumonia.  Intravenous Unasyn completed 10.  Hypertension.  Norvasc 10 mg daily, Coreg 25 mg twice daily, clonidine 0.3 mg twice daily, hydralazine 100 mg every 8 hours, lisinopril 10 mg twice daily.   Vitals:   12/19/19 2004 12/20/19 0311  BP: 126/78 113/77  Pulse: 87 66  Resp:  16  Temp: 98.6 F (37 C) 97.9 F (36.6 C)  SpO2: 100% 93%   Controlled on 7/5             Monitor with increased mobility 11.  Urine drug screen positive marijuana.  Counseled on appropriate  12.  Pre renal azotemia worsening  will enc fluid, given IVF x1 bolus  Labs ordered for tomorrow 13.  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  This will likely occur more often at night.  Monitor for signs of alcohol withdrawal although drinking pattern was mainly on the weekends. 14.  Insomnia trial Ambien  Appears to be improving 15.  Leukocytosis Fevers x 1 but no recurrence , monitor off abx Ucx  with 40,000 growth, UA clean.  Does not appear symptomatic at this time, labs ordered for tomorrow.  Consider antibiotics if symptomatic 16.  Transaminitis  LFTs elevated on 7/1, labs ordered for tomorrow  LOS: 5 days A FACE TO FACE EVALUATION WAS PERFORMED  [facility] Lorie Phenix 12/20/2019, 3:47 PM

## 2019-12-20 NOTE — Progress Notes (Signed)
Occupational Therapy Session Note  Patient Details  Name: Dennis Mercado MRN: [id number] Date of Birth: August 11, 1980  Today's Date: 12/20/2019 OT Individual Time: 0800-0902 OT Individual Time Calculation (min): 62 min    Short Term Goals: Week 1:  OT Short Term Goal 1 (Week 1): Pt will maintain static sitting balance EOB with min assist in preparation for selfcare or transfers. OT Short Term Goal 2 (Week 1): Pt will complete bathing sit to stand with mod assist from supported position. OT Short Term Goal 3 (Week 1): Pt will complete UB dressing with min assist following hemi dressing techniques. OT Short Term Goal 4 (Week 1): Pt will complete toilet transfer with mod assist squat pivot to drop arm commode. OT Short Term Goal 5 (Week 1): Pt will return demonstrate self PROM exercises for the LUE during session with min assist following handout.  Skilled Therapeutic Interventions/Progress Updates:    Pt in bed to start session, agreeable to trying shower this am.  He needed mod instructional cueing for maintaining his eyes open as well as looking at therapist on the right side.  Max assist with max instructional cueing for sequencing rolling to the right side of the bed and for supine to sit EOB.  The Charlaine [place] was utilized for transfer to the shower secondary to time limitations.  Mod assist for sit to stand in the [place] with increased pushing/ lean to the left noted when sitting on the perch of the [place].  He needed max assist for removal of all clothing once in the shower with increased lean to the left still noted when sitting on the padded tub bench with the cutout.  He needed mod instructional cueing for sustained attention to task and for sequencing secondary to perseveration on washing and rinsing the same body parts.  Max hand over hand assist was needed for integration of the LUE to stabilize the washcloth when attempting to apply soap or when washing the RUE.  Therapist assisted with washing  buttocks in sitting with lean to the side.  He needed mod assist for drying off in sitting with use of the [place] again to transfer out to the wheelchair.  He needed max assist for all dressing with max demonstrationall cueing for hemi dressing techniques as well, both UB and LB.  Finished session with therapist assist to donn TEDs and zip up shoes.  He was left in the tilt in space wheelchair with call button and phone in reach with safety belt in place.    Therapy Documentation Precautions:  Precautions Precautions: Fall Precaution Comments: L UE/LE severe paresis with impaired sensation; impaired safety awareness Restrictions Weight Bearing Restrictions: No   Pain: Pain Assessment Pain Scale: 0-10 Pain Score: 0-No pain ADL: See Care Tool Section for some details of mobility and selfcare  Therapy/Group: Individual Therapy  Theodus Ran  OTR/L 12/20/2019, 12:31 PM

## 2019-12-20 NOTE — Plan of Care (Signed)
  Problem: Consults Goal: RH STROKE PATIENT EDUCATION Description: See Patient Education module for education specifics  Outcome: Progressing   Problem: RH BLADDER ELIMINATION Goal: RH STG MANAGE BLADDER WITH ASSISTANCE Description: STG Manage Bladder With min Assistance Outcome: Progressing   Problem: RH SKIN INTEGRITY Goal: RH STG SKIN FREE OF INFECTION/BREAKDOWN Description: Pt will be free of skin breakdown/infection with min assist during CIR stay Outcome: Progressing   Problem: RH SAFETY Goal: RH STG ADHERE TO SAFETY PRECAUTIONS W/ASSISTANCE/DEVICE Description: STG Adhere to Safety Precautions With cues/reminders Assistance/Device. Outcome: Progressing   Problem: RH KNOWLEDGE DEFICIT Goal: RH STG INCREASE KNOWLEDGE OF HYPERTENSION Description: Pt/family will be able to demonstrate knowledge of HTN management with medications and diet precautions and ways to monitor blood pressure with min assist using handouts/booklets provided by staff Outcome: Progressing Goal: RH STG INCREASE KNOWLEGDE OF HYPERLIPIDEMIA Description: Pt/family will be able to demonstrate knowledge of HLD management with medications and diet precautions with min assist using handouts/booklets provided by staff Outcome: Progressing Goal: RH STG INCREASE KNOWLEDGE OF STROKE PROPHYLAXIS Description: Pt/family will be able to demonstrate knowledge of stroke prevention with medications and diet precautions and ways to monitor signs/symptoms of stroke with min assist using handouts/booklets provided by staff Outcome: Progressing   Problem: Consults Goal: RH STROKE PATIENT EDUCATION Description: See Patient Education module for education specifics  Outcome: Progressing Goal: Nutrition Consult-if indicated Outcome: Progressing Goal: Diabetes Guidelines if Diabetic/Glucose > 140 Description: If diabetic or lab glucose is > 140 mg/dl - Initiate Diabetes/Hyperglycemia Guidelines & Document Interventions  Outcome:  Progressing   Problem: RH BOWEL ELIMINATION Goal: RH STG MANAGE BOWEL WITH ASSISTANCE Description: STG Manage Bowel with Assistance. Outcome: Progressing Goal: RH STG MANAGE BOWEL W/MEDICATION W/ASSISTANCE Description: STG Manage Bowel with Medication with Assistance. Outcome: Progressing   Problem: RH BLADDER ELIMINATION Goal: RH STG MANAGE BLADDER WITH ASSISTANCE Description: STG Manage Bladder With Assistance Outcome: Progressing Goal: RH STG MANAGE BLADDER WITH MEDICATION WITH ASSISTANCE Description: STG Manage Bladder With Medication With Assistance. Outcome: Progressing Goal: RH STG MANAGE BLADDER WITH EQUIPMENT WITH ASSISTANCE Description: STG Manage Bladder With Equipment With Assistance Outcome: Progressing   Problem: RH SKIN INTEGRITY Goal: RH STG SKIN FREE OF INFECTION/BREAKDOWN Outcome: Progressing Goal: RH STG MAINTAIN SKIN INTEGRITY WITH ASSISTANCE Description: STG Maintain Skin Integrity With Assistance. Outcome: Progressing Goal: RH STG ABLE TO PERFORM INCISION/WOUND CARE W/ASSISTANCE Description: STG Able To Perform Incision/Wound Care With Assistance. Outcome: Progressing   Problem: RH SAFETY Goal: RH STG ADHERE TO SAFETY PRECAUTIONS W/ASSISTANCE/DEVICE Description: STG Adhere to Safety Precautions With Assistance/Device. Outcome: Progressing Goal: RH STG DECREASED RISK OF FALL WITH ASSISTANCE Description: STG Decreased Risk of Fall With Assistance. Outcome: Progressing   Problem: RH COGNITION-NURSING Goal: RH STG USES MEMORY AIDS/STRATEGIES W/ASSIST TO PROBLEM SOLVE Description: STG Uses Memory Aids/Strategies With Assistance to Problem Solve. Outcome: Progressing Goal: RH STG ANTICIPATES NEEDS/CALLS FOR ASSIST W/ASSIST/CUES Description: STG Anticipates Needs/Calls for Assist With Assistance/Cues. Outcome: Progressing   Problem: RH PAIN MANAGEMENT Goal: RH STG PAIN MANAGED AT OR BELOW PT'S PAIN GOAL Outcome: Progressing   Problem: RH KNOWLEDGE  DEFICIT Goal: RH STG INCREASE KNOWLEDGE OF DIABETES Outcome: Progressing Goal: RH STG INCREASE KNOWLEDGE OF HYPERTENSION Outcome: Progressing Goal: RH STG INCREASE KNOWLEDGE OF DYSPHAGIA/FLUID INTAKE Outcome: Progressing Goal: RH STG INCREASE KNOWLEGDE OF HYPERLIPIDEMIA Outcome: Progressing Goal: RH STG INCREASE KNOWLEDGE OF STROKE PROPHYLAXIS Outcome: Progressing

## 2019-12-21 ENCOUNTER — Inpatient Hospital Stay (HOSPITAL_COMMUNITY): Payer: BC Managed Care – PPO | Admitting: Occupational Therapy

## 2019-12-21 ENCOUNTER — Inpatient Hospital Stay (HOSPITAL_COMMUNITY): Payer: BC Managed Care – PPO | Admitting: Speech Pathology

## 2019-12-21 ENCOUNTER — Inpatient Hospital Stay (HOSPITAL_COMMUNITY): Payer: BC Managed Care – PPO | Admitting: Physical Therapy

## 2019-12-21 LAB — CULTURE, BLOOD (ROUTINE X 2)
Culture: NO GROWTH
Culture: NO GROWTH
Special Requests: ADEQUATE
Special Requests: ADEQUATE

## 2019-12-21 LAB — CBC WITH DIFFERENTIAL/PLATELET
Abs Immature Granulocytes: 0.12 10*3/uL — ABNORMAL HIGH (ref 0.00–0.07)
Basophils Absolute: 0.1 10*3/uL (ref 0.0–0.1)
Basophils Relative: 1 %
Eosinophils Absolute: 0.2 10*3/uL (ref 0.0–0.5)
Eosinophils Relative: 1 %
HCT: 40.3 % (ref 39.0–52.0)
Hemoglobin: 12.6 g/dL — ABNORMAL LOW (ref 13.0–17.0)
Immature Granulocytes: 1 %
Lymphocytes Relative: 10 %
Lymphs Abs: 1.2 10*3/uL (ref 0.7–4.0)
MCH: 27.3 pg (ref 26.0–34.0)
MCHC: 31.3 g/dL (ref 30.0–36.0)
MCV: 87.2 fL (ref 80.0–100.0)
Monocytes Absolute: 1.1 10*3/uL — ABNORMAL HIGH (ref 0.1–1.0)
Monocytes Relative: 9 %
Neutro Abs: 9.6 10*3/uL — ABNORMAL HIGH (ref 1.7–7.7)
Neutrophils Relative %: 78 %
Platelets: 617 10*3/uL — ABNORMAL HIGH (ref 150–400)
RBC: 4.62 MIL/uL (ref 4.22–5.81)
RDW: 14.9 % (ref 11.5–15.5)
WBC: 12.2 10*3/uL — ABNORMAL HIGH (ref 4.0–10.5)
nRBC: 0 % (ref 0.0–0.2)

## 2019-12-21 LAB — COMPREHENSIVE METABOLIC PANEL
ALT: 32 U/L (ref 0–44)
AST: 19 U/L (ref 15–41)
Albumin: 3.5 g/dL (ref 3.5–5.0)
Alkaline Phosphatase: 42 U/L (ref 38–126)
Anion gap: 11 (ref 5–15)
BUN: 28 mg/dL — ABNORMAL HIGH (ref 6–20)
CO2: 22 mmol/L (ref 22–32)
Calcium: 9.6 mg/dL (ref 8.9–10.3)
Chloride: 101 mmol/L (ref 98–111)
Creatinine, Ser: 1.61 mg/dL — ABNORMAL HIGH (ref 0.61–1.24)
GFR calc Af Amer: >60 mL/min (ref >60)
GFR calc non Af Amer: 53 mL/min — ABNORMAL LOW (ref >60)
Glucose, Bld: 123 mg/dL — ABNORMAL HIGH (ref 70–99)
Potassium: 4.4 mmol/L (ref 3.5–5.1)
Sodium: 134 mmol/L — ABNORMAL LOW (ref 135–145)
Total Bilirubin: 0.6 mg/dL (ref 0.3–1.2)
Total Protein: 6.7 g/dL (ref 6.5–8.1)

## 2019-12-21 MED ORDER — TOPIRAMATE 25 MG PO TABS
25.0000 mg | ORAL_TABLET | Freq: Two times a day (BID) | ORAL | Status: DC
  Administered 2019-12-21 – 2019-12-29 (×17): 25 mg via ORAL
  Filled 2019-12-21 (×17): qty 1

## 2019-12-21 MED ORDER — ENOXAPARIN SODIUM 40 MG/0.4ML ~~LOC~~ SOLN
40.0000 mg | Freq: Every day | SUBCUTANEOUS | Status: DC
  Administered 2019-12-21 – 2020-01-12 (×23): 40 mg via SUBCUTANEOUS
  Filled 2019-12-21 (×24): qty 0.4

## 2019-12-21 NOTE — Plan of Care (Signed)
  Problem: Consults Goal: RH STROKE PATIENT EDUCATION Description: See Patient Education module for education specifics  Outcome: Progressing   Problem: RH BLADDER ELIMINATION Goal: RH STG MANAGE BLADDER WITH ASSISTANCE Description: STG Manage Bladder With min Assistance Outcome: Progressing   Problem: RH SKIN INTEGRITY Goal: RH STG SKIN FREE OF INFECTION/BREAKDOWN Description: Pt will be free of skin breakdown/infection with min assist during CIR stay Outcome: Progressing   Problem: RH SAFETY Goal: RH STG ADHERE TO SAFETY PRECAUTIONS W/ASSISTANCE/DEVICE Description: STG Adhere to Safety Precautions With cues/reminders Assistance/Device. Outcome: Progressing   Problem: RH KNOWLEDGE DEFICIT Goal: RH STG INCREASE KNOWLEDGE OF HYPERTENSION Description: Pt/family will be able to demonstrate knowledge of HTN management with medications and diet precautions and ways to monitor blood pressure with min assist using handouts/booklets provided by staff Outcome: Progressing Goal: RH STG INCREASE KNOWLEGDE OF HYPERLIPIDEMIA Description: Pt/family will be able to demonstrate knowledge of HLD management with medications and diet precautions with min assist using handouts/booklets provided by staff Outcome: Progressing Goal: RH STG INCREASE KNOWLEDGE OF STROKE PROPHYLAXIS Description: Pt/family will be able to demonstrate knowledge of stroke prevention with medications and diet precautions and ways to monitor signs/symptoms of stroke with min assist using handouts/booklets provided by staff Outcome: Progressing   Problem: Consults Goal: RH STROKE PATIENT EDUCATION Description: See Patient Education module for education specifics  Outcome: Progressing Goal: Nutrition Consult-if indicated Outcome: Progressing Goal: Diabetes Guidelines if Diabetic/Glucose > 140 Description: If diabetic or lab glucose is > 140 mg/dl - Initiate Diabetes/Hyperglycemia Guidelines & Document Interventions  Outcome:  Progressing   Problem: RH BOWEL ELIMINATION Goal: RH STG MANAGE BOWEL WITH ASSISTANCE Description: STG Manage Bowel with Assistance. Outcome: Progressing Goal: RH STG MANAGE BOWEL W/MEDICATION W/ASSISTANCE Description: STG Manage Bowel with Medication with Assistance. Outcome: Progressing   Problem: RH BLADDER ELIMINATION Goal: RH STG MANAGE BLADDER WITH ASSISTANCE Description: STG Manage Bladder With Assistance Outcome: Progressing Goal: RH STG MANAGE BLADDER WITH MEDICATION WITH ASSISTANCE Description: STG Manage Bladder With Medication With Assistance. Outcome: Progressing Goal: RH STG MANAGE BLADDER WITH EQUIPMENT WITH ASSISTANCE Description: STG Manage Bladder With Equipment With Assistance Outcome: Progressing   Problem: RH SKIN INTEGRITY Goal: RH STG SKIN FREE OF INFECTION/BREAKDOWN Outcome: Progressing Goal: RH STG MAINTAIN SKIN INTEGRITY WITH ASSISTANCE Description: STG Maintain Skin Integrity With Assistance. Outcome: Progressing Goal: RH STG ABLE TO PERFORM INCISION/WOUND CARE W/ASSISTANCE Description: STG Able To Perform Incision/Wound Care With Assistance. Outcome: Progressing   Problem: RH SAFETY Goal: RH STG ADHERE TO SAFETY PRECAUTIONS W/ASSISTANCE/DEVICE Description: STG Adhere to Safety Precautions With Assistance/Device. Outcome: Progressing Goal: RH STG DECREASED RISK OF FALL WITH ASSISTANCE Description: STG Decreased Risk of Fall With Assistance. Outcome: Progressing   Problem: RH COGNITION-NURSING Goal: RH STG USES MEMORY AIDS/STRATEGIES W/ASSIST TO PROBLEM SOLVE Description: STG Uses Memory Aids/Strategies With Assistance to Problem Solve. Outcome: Progressing Goal: RH STG ANTICIPATES NEEDS/CALLS FOR ASSIST W/ASSIST/CUES Description: STG Anticipates Needs/Calls for Assist With Assistance/Cues. Outcome: Progressing   Problem: RH PAIN MANAGEMENT Goal: RH STG PAIN MANAGED AT OR BELOW PT'S PAIN GOAL Outcome: Progressing   Problem: RH KNOWLEDGE  DEFICIT Goal: RH STG INCREASE KNOWLEDGE OF DIABETES Outcome: Progressing Goal: RH STG INCREASE KNOWLEDGE OF HYPERTENSION Outcome: Progressing Goal: RH STG INCREASE KNOWLEDGE OF DYSPHAGIA/FLUID INTAKE Outcome: Progressing Goal: RH STG INCREASE KNOWLEGDE OF HYPERLIPIDEMIA Outcome: Progressing Goal: RH STG INCREASE KNOWLEDGE OF STROKE PROPHYLAXIS Outcome: Progressing

## 2019-12-21 NOTE — Progress Notes (Signed)
[place] PHYSICAL MEDICINE & REHABILITATION PROGRESS NOTE   Subjective/Complaints: . No issues overnite , except pt c/o occipital HA usually occurring every morning and every evening, no increase in intensity  Treadmill ambulation yesterday in therapy Sister at bedside,   ROS: Denies CP, SOB, N/V/D   Objective:   No results found. Recent Labs    12/21/19 0548  WBC 12.2*  HGB 12.6*  HCT 40.3  PLT 617*   Recent Labs    12/21/19 0548  NA 134*  K 4.4  CL 101  CO2 22  GLUCOSE 123*  BUN 28*  CREATININE 1.61*  CALCIUM 9.6    Intake/Output Summary (Last 24 hours) at 12/21/2019 0719 Last data filed at 12/20/2019 2200 Gross per 24 hour  Intake 300 ml  Output 452 ml  Net -152 ml     Physical Exam: Vital Signs Blood pressure 111/72, pulse 79, temperature 98.7 F (37.1 C), resp. rate 18, height 5\' 7"  (1.702 m), weight 71.1 kg, SpO2 100 %.  General: No acute distress Mood and affect are appropriate Heart: Regular rate and rhythm no rubs murmurs or extra sounds Lungs: Clear to auscultation, breathing unlabored, no rales or wheezes Abdomen: Positive bowel sounds, soft nontender to palpation, nondistended Extremities: No clubbing, cyanosis, or edema Skin: No evidence of breakdown, no evidence of rash  Motor:  LUE/LLE: 0/5 proximal to distal  Right lean  Assessment/Plan: 1. 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  Physiatrist is providing close team supervision and 24 hour management of active medical problems listed below.  Physiatrist and rehab team continue to assess barriers to discharge/monitor patient progress toward functional and medical goals  Care Tool:  Bathing    Body parts bathed by patient: Left arm, Chest, Abdomen, Front perineal area, Right upper leg, Left upper leg, Face, Right lower leg   Body parts bathed by helper: Buttocks, Left  lower leg, Right arm     Bathing assist Assist Level: Moderate Assistance - Patient 50 - 74% (sitting on shower seat)     Upper Body Dressing/Undressing Upper body dressing Upper body dressing/undressing activity did not occur (including orthotics): N/A What is the patient wearing?: Pull over shirt    Upper body assist Assist Level: Maximal Assistance - Patient 25 - 49%    Lower Body Dressing/Undressing Lower body dressing      What is the patient wearing?: Incontinence brief, Pants     Lower body assist Assist for lower body dressing: Maximal Assistance - Patient 25 - 49%     Toileting Toileting    Toileting assist Assist for toileting: Set up assist Assistive Device Comment: urinal (assist placing urinal, pt will hold)   Transfers Chair/bed transfer  Transfers assist  Chair/bed transfer activity did not occur: N/A  Chair/bed transfer assist level: Moderate Assistance - Patient 50 - 74%     Locomotion Ambulation   Ambulation assist   Ambulation activity did not occur: Safety/medical concerns  Assist level: 2 helpers Assistive device: Lite Gait Max distance: >48ft   Walk 10 feet activity   Assist  Walk 10 feet activity did not occur: Safety/medical concerns  Assist level: 2 helpers Assistive device: Lite Gait   Walk 50 feet activity   Assist Walk 50 feet with 2 turns activity did not occur: Safety/medical concerns  Assist level: 2 helpers Assistive device: Lite Gait    Walk 150 feet activity  Assist Walk 150 feet activity did not occur: Safety/medical concerns         Walk 10 feet on uneven surface  activity   Assist Walk 10 feet on uneven surfaces activity did not occur: Safety/medical concerns         Wheelchair     Assist Will patient use wheelchair at discharge?: No (No PT lomg term goals set)             Wheelchair 50 feet with 2 turns activity    Assist            Wheelchair 150 feet activity      Assist          Blood pressure 111/72, pulse 79, temperature 98.7 F (37.1 C), resp. rate 18, height 5\' 7"  (1.702 m), weight 71.1 kg, SpO2 100 %.  Medical Problem List and Plan: 1.  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  Antithrombotics: -DVT/anticoagulation: Subcutaneous heparin initiated 12/11/2019,dopplers neg              -antiplatelet therapy: N/A 3. Pain Management: Tylenol as needed,  Post CVA HA start topirimate 25mg  BID  4. Mood: Provide emotional support             -antipsychotic agents: N/A 5. Neuropsych: This patient is?  Fully capable of making decisions on his own behalf. 6. Skin/Wound Care: Routine skin checks 7. Fluids/Electrolytes/Nutrition: Routine in and outs. 8.  Post stroke dysphagia.  Dysphagia #2 thin liquids.  Follow-up speech therapy.  Advance diet as tolerated 9.  Left lung pneumonia.  Intravenous Unasyn completed 10.  Hypertension.  Norvasc 10 mg daily, Coreg 25 mg twice daily, clonidine 0.3 mg twice daily, hydralazine 100 mg every 8 hours, lisinopril 10 mg twice daily.   Vitals:   12/20/19 2206 12/21/19 0438  BP: 110/76 111/72  Pulse:  79  Resp:    Temp:  98.7 F (37.1 C)  SpO2:  100%   Controlled on 7/6             Monitor with increased mobility 11.  Urine drug screen positive marijuana.  Counseled on appropriate  12.  Pre renal azotemia worsening  will enc fluid, given IVF x1 bolus  Labs ordered for tomorrow 13.  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  This will likely occur more often at night.  Monitor for signs of alcohol withdrawal although drinking pattern was mainly on the weekends. 14.  Insomnia trial Ambien  Appears to be improving 15.  Leukocytosis Fevers x 1 but no recurrence , monitor off abx Ucx with 40,000 growth, UA clean.  Does not appear symptomatic at this time,  16.   Transaminitis  LFTs elevated on 7/1, repeat 7/5 , normal   LOS: 6 days A FACE TO FACE EVALUATION WAS PERFORMED  [name] 12/21/2019, 7:19 AM

## 2019-12-21 NOTE — Progress Notes (Signed)
Speech Language Pathology Daily Session Note  Patient Details  Name: Dennis Mercado MRN: [id number] Date of Birth: 1981/01/30  Today's Date: 12/21/2019 SLP Individual Time: 1102-1200 SLP Individual Time Calculation (min): 58 min  Short Term Goals: Week 1: SLP Short Term Goal 1 (Week 1): Pt will consume current diet with efficient mastication and oral clearance and min overt s/sx aspiration with Min A verbal/visual cues for use of compensatory swallow strategies to minimize anterior loss and oral clearance. SLP Short Term Goal 2 (Week 1): Pt will demonstrate efficient mastication and oral clearance of Dys 3 textures over 3 sessions prior to advancement. SLP Short Term Goal 3 (Week 1): Pt will demonstrate ability to problem solve basic situations with Mod A verbal/visual cues. SLP Short Term Goal 4 (Week 1): Pt will sustain attention to functional tasks with Mod A verbal/visual cues. SLP Short Term Goal 5 (Week 1): Pt will recall new and/or daily information with Mod A cues for use of strategies/aids. SLP Short Term Goal 6 (Week 1): Pt will detect functional errors with Max A multimodal cues.  Skilled Therapeutic Interventions: Pt was seen for skilled ST targeting dysphagia and cognition. SLP provided upgraded trial of dysphagia 3 solid (mech soft) snack. Pt demonstrated efficient mastication and clearance of mild left buccal pocketing with Min A verbal cues. Would recommend additional trial to ensure consistency and promote independence with use of strategies prior to upgrade; continue current diet for now. Pt perseverative on getting back into bed, therefore NT and SLP provided assistance with transfer from wheelchair to bed with SLP providing Moderate verbal cues for sustained attention and safety awareness during transfer. Pt required overall Max A cues for redirection throughout rest of session. Although oriented X4, he continues to verbalize confabulations at times. During a basic novel card task  (11up), pt required Max A verbal and visual cues for visual scanning left to right, Mod A for functional problem solving, and Min A for recall. Pt left laying in bed with alarm set and needs within reach, telesitter active. Continue per current plan of care.        Pain Pain Assessment Pain Scale: 0-10 Pain Score: 0-No pain  Therapy/Group: Individual Therapy  Arbutus Leas 12/21/2019, 7:04 AM

## 2019-12-21 NOTE — Progress Notes (Signed)
Physical Therapy Session Note  Patient Details  Name: Dennis Mercado MRN: [id number] Date of Birth: [birth year]  Today's Date: 12/21/2019 PT Individual Time: 3557-3220 and 2542-7062 PT Individual Time Calculation (min): 14 min and 26 min   Short Term Goals: Week 1:  PT Short Term Goal 1 (Week 1): Pt will perform supine<>sit with mod assist PT Short Term Goal 2 (Week 1): Pt will perform bed<>chair transfer with max assist (not using lift equipment) PT Short Term Goal 3 (Week 1): Pt will initiate gait training PT Short Term Goal 4 (Week 1): Pt will participate in sitting balance assessment  Skilled Therapeutic Interventions/Progress Updates:    (Time: [phone]) Pt received supine in bed with his mom, Santiago Glad, present. Pt's mom reports he has been asleep for the past ~75minutes. Pt reports he is "so tired" and feels like he just needs to rest. With some encouragement from family and therapist pt agreeable to attempt session at this time. Supine>sitting R EOB with max assist for trunk upright and max cuing for sequencing. RN present for medication administration. While sitting on EOB pt continues to report significant fatigue stating he just needs to rest at this time. Pt requests for therapist to come back at a later time. Sit>supine with max assist for B LE management back up into bed. Pt left supine in bed with needs in reach, bed alarm on, and quick to start falling asleep.  (Time: [phone]) Therapist returned to complete session and pt L sidelying in bed restless reporting he is ready to get moving. Donned B shoes max assist. L sidelying>sitting R EOB with max assist for B LE management and trunk upright. L squat pivot EOB>w/c with mod assist for lifting and pivoting with max cuing for head/hips relationship.  Transported to/from gym in w/c for time management and energy conservation. L squat pivot w/c>EOM again with mod assist and max cuing for head/hips relationship and sequencing. Sitting EOM  participated in trunk control and L attention task to lean L onto forearm support (therapist facilitating UE positioning) while grasping and placing clothespins on a line with R hand - varying from CGA to mod assist for trunk control due to posterior lean - dual-cognitive task of trying to figure out how many clothespins of a certain color were left via simple subtraction (example: 6-4=2 with pt requiring max/total assist to complete this cognitive task). Jeneen Rinks, OT present to assume care of patient. Pain: Pt reports neck pain - noted to be using k-pad in bed - therapist provided repositioning for pain management - RN notified for medication administration. Missed 20 minutes of skilled physical therapy.   Therapy Documentation Precautions:  Precautions Precautions: Fall Precaution Comments: L UE/LE severe paresis with impaired sensation; impaired safety awareness Restrictions Weight Bearing Restrictions: No  Therapy/Group: Individual Therapy  Tawana Scale , PT, DPT, CSRS  12/21/2019, 12:28 PM

## 2019-12-21 NOTE — Progress Notes (Signed)
Pt last voided at 2200 on 7/5. Pt attempted to void at 0530 but was unable to. Pt was scanned at 0630 for 310ml. Pt attempted to void in urinal but was unable to. Pt stated that he needed to be left alone w/ water running to void, however was unable to. Pt was given ample amount of time to void but still was not able to. Pt was stood up on steady, but still did not void. RN broached the need for pt to void via I/O cath, but pt adamantly refused. It was explained to pt the reason for I/O caths and possible complications if it weren't done in a timely manner, but pt still noncompliant. Pt was educated on bladder program compliance.

## 2019-12-21 NOTE — Progress Notes (Addendum)
Occupational Therapy Session Note  Patient Details  Name: Dennis Mercado MRN: [id number] Date of Birth: 11-Feb-1981  Today's Date: 12/21/2019 OT Individual Time: 0904-1003 OT Individual Time Calculation (min): 59 min    Short Term Goals: Week 1:  OT Short Term Goal 1 (Week 1): Pt will maintain static sitting balance EOB with min assist in preparation for selfcare or transfers. OT Short Term Goal 2 (Week 1): Pt will complete bathing sit to stand with mod assist from supported position. OT Short Term Goal 3 (Week 1): Pt will complete UB dressing with min assist following hemi dressing techniques. OT Short Term Goal 4 (Week 1): Pt will complete toilet transfer with mod assist squat pivot to drop arm commode. OT Short Term Goal 5 (Week 1): Pt will return demonstrate self PROM exercises for the LUE during session with min assist following handout.  Skilled Therapeutic Interventions/Progress Updates:    Session 1 [phone]) Pt in bed to start session with head turned to the left.  He verbally acknowledged therapist, but did not turn his head to the right to see him unless given verbal cueing to do this.  In supine, therapist assisted with donning TEDs and then pt completed transfer from supine to sit on the right side of the bed.  Max assist was needed for all aspects.  Once sitting upright, he was able to maintain static sitting balance with supervision.  Had him work on LB dressing at [place].  Max assist with max instructional cueing for threading his pants over the LLE, by crossing it over the right knee.  Decreased ability to orient clothing as well.  Mod assist was needed for sit to stand from the EOB to pull them up over his hips.  He was able to donn and fasten the right shoe, but needed max assist for the left with AFO. Mod assist transfer to the wheelchair stand pivot was completed next for transfer out of the room.  He was taken down to the therapy gym where he worked on midline standing orientation  as well as functional mobility with use of the mirror for feedback.  He ambulated three muskateers style with total +2 (pt 25%) for approximately 25'.  Initially, he was able to advance the LLE with AFO in place and overall mod assist for positioning, however as he fatigued, he needed max assist to advance it.  He demonstrated decreased ability to achieve and maintain hip and knee extension on the left as well as over extending his trunk and head when attempting to assist with movements.  He completed one other short distance mobility interval with max assist of one person for approximately 5'.  Pt reported pain in his left shoulder as well as right side with this interval so it was halted.  Noted pt with increased right head tilt in sitting with increased tightness in the right neck flexors as well.  Vision was examined slightly with pt demonstrating increased nystagmus and jerky eye movements in the left eye with tracking to the left and right.  Did not note this in the right eye.  He also reports some diplopia as well with scanning, to be further examined in treatment.  Finished session with return to the room and pt positioned up in the tilt in space wheelchair with the LUE support.  Call button and phone in reach with nursing made aware that pt wants to finish breakfast.    Session 2 ([id number])  Pt with PT in the gym to  start session.  Had him complete stand pivot transfer to the wheelchair from the therapy mat with mod assist.  Therapist then applied NMES to the left upper arm for treatment of subluxation.  See below for settings with intensity set on 8 beeps and noted contraction of medial deltoid seen.  No reports of pain or discomfort from stimulation were noted after one hour.  No redness was observed either.  Next, therapist applied kinesiotape to his right upper trap and sternocleidomastoid to help increase elongation secondary to him maintaining a right head tilt.  Kinesiotape was also applied to the  left sternocleidomastoid to help facilitate activation as well.  He was returned to the room via wheelchair and left sitting up with LUE supported on pillows and call button and phone in reach. Saebo Stim One 330 pulse width 35 Hz pulse rate On 8 sec/ off 8 sec Ramp up/ down 2 sec Symmetrical Biphasic wave form  Max intensity 111mA at 500 Ohm load  Therapy Documentation Precautions:  Precautions Precautions: Fall Precaution Comments: L UE/LE severe paresis with impaired sensation; impaired safety awareness Restrictions Weight Bearing Restrictions: No   Pain: Pain Assessment Pain Scale: 0-10 Pain Score: 0-No pain Faces Pain Scale: Hurts a little bit Pain Type: Acute pain Pain Location: Hip Pain Orientation: Left Pain Descriptors / Indicators: Discomfort Pain Onset: With Activity Pain Intervention(s): Emotional support;Repositioned ADL: See Care Tool Section for some details of mobility and selfcare  Therapy/Group: Individual Therapy  Seng Fouts OTR/L 12/21/2019, 12:17 PM

## 2019-12-22 ENCOUNTER — Inpatient Hospital Stay (HOSPITAL_COMMUNITY): Payer: BC Managed Care – PPO | Admitting: Physical Therapy

## 2019-12-22 ENCOUNTER — Inpatient Hospital Stay (HOSPITAL_COMMUNITY): Payer: BC Managed Care – PPO | Admitting: Occupational Therapy

## 2019-12-22 ENCOUNTER — Inpatient Hospital Stay (HOSPITAL_COMMUNITY): Payer: BC Managed Care – PPO

## 2019-12-22 ENCOUNTER — Inpatient Hospital Stay (HOSPITAL_COMMUNITY): Payer: BC Managed Care – PPO | Admitting: Speech Pathology

## 2019-12-22 MED ORDER — LIDOCAINE 5 % EX PTCH
1.0000 | MEDICATED_PATCH | CUTANEOUS | Status: DC
  Administered 2019-12-22 – 2020-01-11 (×12): 1 via TRANSDERMAL
  Filled 2019-12-22 (×17): qty 1

## 2019-12-22 NOTE — Progress Notes (Signed)
Speech Language Pathology Daily Session Note  Patient Details  Name: Dennis Mercado MRN: [id number] Date of Birth: 03-06-1981  Today's Date: 12/22/2019 SLP Individual Time: 1405-1450 SLP Individual Time Calculation (min): 45 min  Short Term Goals: Week 1: SLP Short Term Goal 1 (Week 1): Pt will consume current diet with efficient mastication and oral clearance and min overt s/sx aspiration with Min A verbal/visual cues for use of compensatory swallow strategies to minimize anterior loss and oral clearance. SLP Short Term Goal 2 (Week 1): Pt will demonstrate efficient mastication and oral clearance of Dys 3 textures over 3 sessions prior to advancement. SLP Short Term Goal 3 (Week 1): Pt will demonstrate ability to problem solve basic situations with Mod A verbal/visual cues. SLP Short Term Goal 4 (Week 1): Pt will sustain attention to functional tasks with Mod A verbal/visual cues. SLP Short Term Goal 5 (Week 1): Pt will recall new and/or daily information with Mod A cues for use of strategies/aids. SLP Short Term Goal 6 (Week 1): Pt will detect functional errors with Max A multimodal cues.  Skilled Therapeutic Interventions: Pt was seen for skilled ST targeting dysphagia and cognition. Pt accepted upgraded trial of dysphagia 3 (mech soft) solid and thin liquids, during which pt exhibited efficient mastication and oral clearance with only Min A verbal cues for attention to and use of strategies for clearance of mild left buccal pocketing. No overt s/sx aspiration observed across liquids or solids. Recommend pt upgrade to dysphagia 3 textures, continue thin liquids, please ensure full supervision during meals to cue for swallow strategies for oral clearance and sustained attention to PO intake.  Cognitive interventions focused on sustained attention, visual scanning, and functional recall. Pt verbally recalled self-feeding and compensatory swallow strategies Mod I, however Min cues required for recall  and carryover of therapist's name. Throughout session he continues to require Max A verbal cues for redirection to structured and functional tasks for short intervals of time (1-3 min) due primarily to internal distractions. Pt participated in a trail making task, with overall Max faded to Mod A for scanning and locating letters in left visual field on page. During session he expressed need to void, and although set up with urinal he was unable to successfully void. Pt required more encouragement for participation today in comparison to previous encounters; session ended 15 mins early today due to pt's continued request to rest and finish another day. Pt left sitting in chair with alarm set and needs within reach, wife present. Continue per current plan of care.        Pain Pain Assessment Pain Scale: 0-10 Pain Score: 0-No pain   Therapy/Group: Individual Therapy  Arbutus Leas 12/22/2019, 7:29 AM

## 2019-12-22 NOTE — Progress Notes (Signed)
Occupational Therapy Weekly Progress Note  Patient Details  Name: Dennis Mercado MRN: [id number] Date of Birth: 1981-01-30  Beginning of progress report period: December 16, 2019 End of progress report period: December 22, 2019  Today's Date: 12/22/2019 OT Individual Time: 0865-7846 OT Individual Time Calculation (min): 48 min    Patient has met 3 of 4 short term goals.  Dennis Mercado is making steady progress with OT at this time.  He is able to complete UB bathing at min assist level with UB dressing at mod assist level.  LB bathing is completed at mod assist sit to stand with LB dressing at max assist.  He continues to demonstrate moderate LUE and LLE hemiplegia, with only Brunnstrum stage I noted in the hand and stage II in the arm.  Increased pectoral tone is noted in the chest causing increased internal rotation of the humerus as well as adduction.  He needs total hand over hand assist for integration into selfcare tasks at this time.  Dennis Mercado is able to complete sit to stand with min to mod assist as well as completing stand pivot transfers at mod assist.  He does exhibit some slight pusher tendencies to the left side as well.  Overall, decreased sustained attention is noted with mod to max instructional cueing required to maintain his attention on selfcare tasks and work on following hemi dressing techniques.  Dennis Mercado also demonstrates some visual attention as well as tracking impairments.  Diplopia is present at times and he continues to exhibit increased nystagmus in the left eye with tracking and with gaze holding in the medial and lateral fields.  Confusion is still present with decreased awareness noted regarding his current level of function.  Feel overall that he will continue to need CIR level therapy to maximize progress at this time.  Will continue with current OT POC with progression of therapy to achieve min assist level goals.   Patient continues to demonstrate the following deficits:  muscle weakness and muscle paralysis, impaired timing and sequencing, abnormal tone, unbalanced muscle activation and decreased coordination, decreased visual perceptual skills and decreased visual motor skills, decreased midline orientation and decreased attention to left, decreased attention, decreased awareness, decreased problem solving, decreased safety awareness, decreased memory and delayed processing and decreased sitting balance, decreased standing balance, decreased postural control, hemiplegia and decreased balance strategies and therefore will continue to benefit from skilled OT intervention to enhance overall performance with BADL and Reduce care partner burden.  Patient progressing toward long term goals..  Continue plan of care.  OT Short Term Goals Week 2:  OT Short Term Goal 1 (Week 2): Pt will complete UB dressing with min assist following hemi dressing techniques. OT Short Term Goal 2 (Week 2): Pt will donn pants and brief with mod assist following hemi techniques for two consecutive sessions. OT Short Term Goal 3 (Week 2): Pt will complete sit to stand with min assist during LB selfcare tasks. OT Short Term Goal 4 (Week 2): Pt will use the LUE as a stabilizer with max assist hand over hand during selfcare tasks.  Skilled Therapeutic Interventions/Progress Updates:    Pt was up in the tilt in space wheelchair to start session.  He was taken down to the therapy gym where he completed transfer stand pivot to the therapy mat with mod assist.  Once on the mat, mirror was provided for feedback on sitting balance.  He was able to maintain static sitting balance with supervision and no UE  support.  He does tend to place the RUE on the mat beside of him and lean onto it, with increased shoulder hike on the right side.  Worked on stretching in horizontal adduction and external rotation secondary to noted increased pectoral tone in the LUE that was not seen during therapy yesterday.  Also had him  work on [employer] with small movements of shoulder flexion and slight internal rotation.  Trace movements noted in the shoulder but none in the elbow or digits.  Had him also work on static standing with use of the mirror for feedback and mod assist.  Attempted to fit new Givmohr sling for the LUE, but unable to get it to fit correctly.  Called out to vendors to come and assess fit and supply a larger size if needed.  Pt completed transfer back to the wheelchair at mod assist and was transported back to the room.  Therapist applied NMES using Saebo device with parameters listed below.  He was able to tolerate 60 mins with intensity set at level 6 without any adverse reactions.  Therapist went back and removed pads without any noted redness of discomfort.  Pt was left up in the wheelchair with the call button and phone in reach and safety belt in place. His spouse was present as well.   Saebo Stim One 330 pulse width 35 Hz pulse rate On 8 sec/ off 8 sec Ramp up/ down 2 sec Symmetrical Biphasic wave form  Max intensity 162m at 500 Ohm load     Therapy Documentation Precautions:  Precautions Precautions: Fall Precaution Comments: L UE/LE severe paresis with impaired sensation; impaired safety awareness Restrictions Weight Bearing Restrictions: No   Pain: Pain Assessment Pain Scale: Faces Pain Score: 0-No pain Faces Pain Scale: Hurts a little bit Pain Type: Acute pain Pain Location: Neck Pain Orientation: Posterior Pain Descriptors / Indicators: Aching Pain Onset: On-going Patients Stated Pain Goal: 0 Pain Intervention(s): Repositioned;Distraction ADL: See Care Tool Section for some details of mobility and selfcare  Therapy/Group: Individual Therapy  Dexter Signor OTR/L 12/22/2019, 3:19 PM

## 2019-12-22 NOTE — Patient Care Conference (Signed)
Inpatient RehabilitationTeam Conference and Plan of Care Update Date: 12/22/2019   Time: 1:36 PM    Patient Name: Dennis Mercado      Medical Record Number: [id number]  Date of Birth: 23-Nov-1980 Sex: Male         Room/Bed: 4W06C/4W06C-01 Payor Info: Payor: [place] / Plan: [employer] COMM PPO / Product Type: *No Product type* /    Admit Date/Time:  12/15/2019  4:59 PM  Primary Diagnosis:  <principal problem not specified>  Hospital Problems: Active Problems:   ICH (intracerebral hemorrhage) (HCC)   Prerenal azotemia   Sleep disturbance   Leukocytosis   Transaminitis    Expected Discharge Date: Expected Discharge Date: 01/12/20  Team Members Present: Physician leading conference: Dr. Alysia Penna Care Coodinator Present: Dorien Chihuahua, RN, BSN, CRRN;Christina Sampson Goon, Jarrell Nurse Present: Other (comment) Chaney Born , RN) PT Present: Barrie Folk, PT OT Present: Clyda Greener, OT SLP Present: Jettie Booze, CF-SLP PPS Coordinator present : Gunnar Fusi, SLP     Current Status/Progress Goal Weekly Team Focus  Bowel/Bladder   Continent/incontinent at times with B&B. Last BM 7/5; small.  Regain full continence of B&B.  Assess toileting needs PRN.   Swallow/Nutrition/ Hydration   Dysphagia 2 texures, thin liquids, Min-Mod A strategies  Supervision  Trials advanced solids, carryover strategies for oral clearance and minimizing anterior loss   ADL's   Min assist for UB bathing with mod to max for UB dressing.  LB bathing at mod assist level with max assist for LB dressing.  Mod assist for stand pivot transfers as well.  Brunnstrum stage I-II in the left arm with slight increased tone noted.  Decreased sustained attention, diplopia, decreased awareness and memory.  min assist  selfcare retraining, NMES, neuromuscular re-education, exercise, kinesiotaping, balnace retraining, therapeutic activities   Mobility   max assist supine<>sit, mod assist sit<>stand, mod assist  squat pivot transfers, gait up to 90ft using B UE support via 3 Musketeers with +2 mod assist  - impaired sustained attention and processing as well as impaired awareness and L inattention all impact mobility  min assist overall  L attention, bed mobility, transfer training, gait training, trunk control/sitting balance, midline orientation, activity tolerance, L LE NMR and WBing, pt education   Communication             Safety/Cognition/ Behavioral Observations  Mod-Max attention, Min-Mod problem solving, recall, problem solving also impacted by left inattention, confabulation  Supervision  functional problem solving, attention to tasks, visual scanning, emergent awareness   Pain   Request Tylenol Q4H.  Pain 0  Assess pain Qshift and PRN.   Skin   Clean, Dry, Intact.  Maintain good skin integrity.  Assess skin QShift.     Team Discussion:  Discharge Planning/Teaching Needs:  Goal is for patient to discharge home  Will schedule with spouse and mother   Current Update: on target  Current Barriers to Discharge:  Decreased caregiver support and Lack of/limited family support in the evenings.  Possible Resolutions to Barriers: SW to review with wife available assistance and resources for hired help available to help with patient care at discharge.   Patient on target to meet rehab goals: yes, pushes left with right head tilt causing neck pain.  Decreased awareness, visual tracking with left eye/double vision and impaired emergent awareness with ham-string tone limits function-progress as well as visual hallucinations and confabulations yield need for redirecting.   *See Care Plan and progress notes for long and short-term goals.  Revisions to Treatment Plan:  Requested sling for left arm subluxation support and kinesio tape/lidocaine patch for neck pain.    Medical Summary Current Status: increasing tone Left hamstring tone, D 2 thin liq diet, hallucinations improving Weekly  Focus/Goal: tone managment LLE with PT, orthotic management  Barriers to Discharge: Medical stability   Possible Resolutions to Barriers: Orthotics consult, consider spasticity mangement with meds   Continued Need for Acute Rehabilitation Level of Care: The patient requires daily medical management by a physician with specialized training in physical medicine and rehabilitation for the following reasons: Direction of a multidisciplinary physical rehabilitation program to maximize functional independence : Yes Medical management of patient stability for increased activity during participation in an intensive rehabilitation regime.: Yes Analysis of laboratory values and/or radiology reports with any subsequent need for medication adjustment and/or medical intervention. : Yes   I attest that I was present, lead the team conference, and concur with the assessment and plan of the team.   Dorien Chihuahua B 12/22/2019, 1:36 PM

## 2019-12-22 NOTE — Plan of Care (Signed)
  Problem: RH SAFETY Goal: RH STG ADHERE TO SAFETY PRECAUTIONS W/ASSISTANCE/DEVICE Description: STG Adhere to Safety Precautions With cues/reminders Assistance/Device. Outcome: Progressing   Problem: RH BOWEL ELIMINATION Goal: RH STG MANAGE BOWEL WITH ASSISTANCE Description: STG Manage Bowel with Assistance. Outcome: Progressing

## 2019-12-22 NOTE — Progress Notes (Signed)
[place] PHYSICAL MEDICINE & REHABILITATION PROGRESS NOTE   Subjective/Complaints: . No HA, no hallucinations last noc, Slept ok   ROS: Denies CP, SOB, N/V/D   Objective:   No results found. Recent Labs    12/21/19 0548  WBC 12.2*  HGB 12.6*  HCT 40.3  PLT 617*   Recent Labs    12/21/19 0548  NA 134*  K 4.4  CL 101  CO2 22  GLUCOSE 123*  BUN 28*  CREATININE 1.61*  CALCIUM 9.6    Intake/Output Summary (Last 24 hours) at 12/22/2019 0854 Last data filed at 12/21/2019 2045 Gross per 24 hour  Intake 240 ml  Output 1200 ml  Net -960 ml     Physical Exam: Vital Signs Blood pressure 114/82, pulse 80, temperature 98.3 F (36.8 C), resp. rate 14, height 5' 7"  (1.702 m), weight 71.1 kg, SpO2 100 %.  General: No acute distress Mood and affect are appropriate Heart: Regular rate and rhythm no rubs murmurs or extra sounds Lungs: Clear to auscultation, breathing unlabored, no rales or wheezes Abdomen: Positive bowel sounds, soft nontender to palpation, nondistended Extremities: No clubbing, cyanosis, or edema  Motor:  LUE/LLE: 0/5 proximal to distal  Right lean  Assessment/Plan: 1. 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  Physiatrist is providing close team supervision and 24 hour management of active medical problems listed below.  Physiatrist and rehab team continue to assess barriers to discharge/monitor patient progress toward functional and medical goals  Care Tool:  Bathing    Body parts bathed by patient: Left arm, Chest, Abdomen, Front perineal area, Right upper leg, Left upper leg, Face, Right lower leg   Body parts bathed by helper: Buttocks, Left lower leg, Right arm     Bathing assist Assist Level: Moderate Assistance - Patient 50 - 74% (sitting on shower seat)     Upper Body Dressing/Undressing Upper body dressing Upper body  dressing/undressing activity did not occur (including orthotics): N/A What is the patient wearing?: Pull over shirt    Upper body assist Assist Level: Maximal Assistance - Patient 25 - 49%    Lower Body Dressing/Undressing Lower body dressing      What is the patient wearing?: Incontinence brief, Pants     Lower body assist Assist for lower body dressing: Maximal Assistance - Patient 25 - 49%     Toileting Toileting    Toileting assist Assist for toileting: Set up assist Assistive Device Comment: urinal (assist placing urinal, pt will hold)   Transfers Chair/bed transfer  Transfers assist  Chair/bed transfer activity did not occur: N/A  Chair/bed transfer assist level: Moderate Assistance - Patient 50 - 74% (squat pivot)     Locomotion Ambulation   Ambulation assist   Ambulation activity did not occur: Safety/medical concerns  Assist level: 2 helpers Assistive device: Hand held assist Max distance: 25'   Walk 10 feet activity   Assist  Walk 10 feet activity did not occur: Safety/medical concerns  Assist level: 2 helpers Assistive device: Lite Gait   Walk 50 feet activity   Assist Walk 50 feet with 2 turns activity did not occur: Safety/medical concerns  Assist level: 2 helpers Assistive device: Lite Gait    Walk 150 feet activity   Assist Walk 150 feet activity did not occur: Safety/medical concerns         Walk 10 feet on uneven surface  activity  Assist Walk 10 feet on uneven surfaces activity did not occur: Safety/medical concerns         Wheelchair     Assist Will patient use wheelchair at discharge?: No (No PT lomg term goals set)             Wheelchair 50 feet with 2 turns activity    Assist            Wheelchair 150 feet activity     Assist          Blood pressure 114/82, pulse 80, temperature 98.3 F (36.8 C), resp. rate 14, height 5' 7"  (1.702 m), weight 71.1 kg, SpO2 100 %.  Medical  Problem List and Plan: 1.  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 Formulized individual treatment plan based on medical history, underlying problem and comorbidities.  PRAFO/WHO qhs 2.  Antithrombotics: -DVT/anticoagulation: Subcutaneous heparin initiated 12/11/2019,dopplers neg              -antiplatelet therapy: N/A 3. Pain Management: Tylenol as needed,  Post CVA HA start topirimate 68m BID  4. Mood: Provide emotional support             -antipsychotic agents: N/A 5. Neuropsych: This patient is?  Fully capable of making decisions on his own behalf. 6. Skin/Wound Care: Routine skin checks 7. Fluids/Electrolytes/Nutrition: Routine in and outs. 8.  Post stroke dysphagia.  Dysphagia #2 thin liquids.  Follow-up speech therapy.  Advance diet as tolerated 9.  Left lung pneumonia.  Intravenous Unasyn completed 10.  Hypertension.  Norvasc 10 mg daily, Coreg 25 mg twice daily, clonidine 0.3 mg twice daily, hydralazine 100 mg every 8 hours, lisinopril 10 mg twice daily.   Vitals:   12/21/19 1941 12/22/19 0332  BP: 109/75 114/82  Pulse: 87 80  Resp:    Temp: 98.3 F (36.8 C) 98.3 F (36.8 C)  SpO2: 98% 100%   Controlled on 7/7             Monitor with increased mobility 11.  Urine drug screen positive marijuana.  Counseled on appropriate  12.  Pre renal azotemia worsening  will enc fluid, given IVF x1 bolus  Labs ordered for tomorrow 13.  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  This will likely occur more often at night.  Monitor for signs of alcohol withdrawal although drinking pattern was mainly on the weekends. 14.  Insomnia trial Ambien  Appears to be improving 15.  Leukocytosis Fevers x 1 but no recurrence , monitor off abx Ucx with 40,000 growth,  UA clean.  Does not appear symptomatic at this time,  16.  Transaminitis  LFTs elevated on 7/1, repeat 7/5 , normal   LOS: 7 days A FACE TO FACE EVALUATION WAS PERFORMED  ACharlett Blake7/12/2019, 8:54 AM

## 2019-12-22 NOTE — Progress Notes (Signed)
Physical Therapy Session Note  Patient Details  Name: Dennis Mercado MRN: [id number] Date of Birth: Oct 20, 1980   Short Term Goals: Week 1:  PT Short Term Goal 1 (Week 1): Pt will perform supine<>sit with mod assist PT Short Term Goal 2 (Week 1): Pt will perform bed<>chair transfer with max assist (not using lift equipment) PT Short Term Goal 3 (Week 1): Pt will initiate gait training PT Short Term Goal 4 (Week 1): Pt will participate in sitting balance assessment  Skilled Therapeutic Interventions/Progress Updates:     Pt received supine in bed and agreeable to therapy. Reports pain in neck, primarily on R side. Number not provided. Pt provides repositioning and manual therapy to neck to alleviate pain with good response from pt. PT manually facilitates L lateral sidebend and R rotation of neck for stretch of R sided neck musculature with 30 second holds.  Supine to sit with maxA and cues on sequencing and positioning at EOB. Pt demos R sided and slightly posterior lean in sitting, requiring modA for stability. PT dons ted hose and shoes with PLS AFO in L shoe. Lateral transfer to TIS with modA.   Pt performs multiple sit to stand transfers from TIS with PT providing modA, blocking L knee and pt holding onto R handrail. Mirror used for Warden/ranger. Pt has difficulty attaining midline positioning, with trunk lean going in multiple directions throughout standing. Pt achieves best sanding posture without RUE support and with LUE around PT shoulders. L knee buckles multiple times, requiring maxA for safety. Pt performs ambulation 5', 5', 10' with maxA and +2 WC follow. Left seated in TIS with all needs within reach.  Therapy Documentation Precautions:  Precautions Precautions: Fall Precaution Comments: L UE/LE severe paresis with impaired sensation; impaired safety awareness Restrictions Weight Bearing Restrictions: No   Therapy/Group: Individual Therapy  Breck Coons, PT,  DPT 12/22/2019, 1:31 PM

## 2019-12-22 NOTE — Progress Notes (Signed)
Physical Therapy Session Note  Patient Details  Name: Dennis Mercado MRN: [id number] Date of Birth: 1981/03/29  Today's Date: 12/22/2019 PT Individual Time: 1132-1202 PT Individual Time Calculation (min): 30 min   Short Term Goals: Week 1:  PT Short Term Goal 1 (Week 1): Pt will perform supine<>sit with mod assist PT Short Term Goal 2 (Week 1): Pt will perform bed<>chair transfer with max assist (not using lift equipment) PT Short Term Goal 3 (Week 1): Pt will initiate gait training PT Short Term Goal 4 (Week 1): Pt will participate in sitting balance assessment  Skilled Therapeutic Interventions/Progress Updates:    patient received in TIS WC, pleasant and willing to work with therapy this afternoon. Focused session on static standing in parallel bars- able to come to full standing position with MinA, however had difficulty maintaining good positioning of BLEs today- tended to tuck RLE far back underneath him so he was only pressing up on toes, as well as with multidirectional balance loss. Reporst he has a hard time telling where R foot is. Added 4# weights to R shoe with improved proprioception and positioning of foot and able to maintain midline standing in parallel bars with Min guard-MinA with this addition.  Standing tolerance limited by fatigue this morning. Left up in TIS WC with seatbelt alarm active, spouse present and all needs met.   Therapy Documentation Precautions:  Precautions Precautions: Fall Precaution Comments: L UE/LE severe paresis with impaired sensation; impaired safety awareness Restrictions Weight Bearing Restrictions: No    Pain: Pain Assessment Pain Scale: Faces Faces Pain Scale: Hurts a little bit Pain Type: Acute pain Pain Location: Neck Pain Orientation: Posterior Pain Descriptors / Indicators: Aching Pain Onset: On-going Patients Stated Pain Goal: 0 Pain Intervention(s): Repositioned;Distraction      Therapy/Group: Individual Therapy    Windell Norfolk, DPT, PN1   Supplemental Physical Therapist [place]    Pager [phone] Acute Rehab Office [phone]    12/22/2019, 12:28 PM

## 2019-12-22 NOTE — Progress Notes (Signed)
Patient ID: Dennis Mercado, male   DOB: July 13, 1980, 39 y.o.   MRN: [id number] Team Conference Report to Patient/Family  Team Conference discussion was reviewed with the patient and caregiver, including goals, any changes in plan of care and target discharge date.  Patient and caregiver express understanding and are in agreement.  The patient has a target discharge date of 01/12/20.  Dyanne Iha 12/22/2019, 2:01 PM

## 2019-12-22 NOTE — Progress Notes (Signed)
Orthopedic Tech Progress Note Patient Details:  Dennis Mercado December 24, 1980 [id number] Ordered outside vendor brace. Patient ID: Dennis Mercado, male   DOB: November 15, 1980, 39 y.o.   MRN: [id number]   Tammy Sours 12/22/2019, 11:01 AM

## 2019-12-23 ENCOUNTER — Inpatient Hospital Stay (HOSPITAL_COMMUNITY): Payer: BC Managed Care – PPO | Admitting: *Deleted

## 2019-12-23 ENCOUNTER — Inpatient Hospital Stay (HOSPITAL_COMMUNITY): Payer: BC Managed Care – PPO | Admitting: Occupational Therapy

## 2019-12-23 ENCOUNTER — Inpatient Hospital Stay (HOSPITAL_COMMUNITY): Payer: BC Managed Care – PPO | Admitting: Physical Therapy

## 2019-12-23 ENCOUNTER — Inpatient Hospital Stay (HOSPITAL_COMMUNITY): Payer: BC Managed Care – PPO

## 2019-12-23 ENCOUNTER — Inpatient Hospital Stay (HOSPITAL_COMMUNITY): Payer: BC Managed Care – PPO | Admitting: Speech Pathology

## 2019-12-23 MED ORDER — QUETIAPINE FUMARATE 25 MG PO TABS
25.0000 mg | ORAL_TABLET | Freq: Every day | ORAL | Status: DC
  Administered 2019-12-23: 25 mg via ORAL
  Filled 2019-12-23: qty 1

## 2019-12-23 MED ORDER — HYDRALAZINE HCL 50 MG PO TABS
50.0000 mg | ORAL_TABLET | Freq: Three times a day (TID) | ORAL | Status: DC
  Administered 2019-12-23 – 2019-12-24 (×5): 50 mg via ORAL
  Filled 2019-12-23 (×6): qty 1

## 2019-12-23 NOTE — Progress Notes (Signed)
Pt was bladder scanned for 453 @ 1158. NT attempted to I/O pt but pt became verbal and physical and refused to be cathed. Charge nurse was notified. Will attempt to help pt empty bladder after lunch. Doy Hutching, RN

## 2019-12-23 NOTE — Progress Notes (Signed)
[place] PHYSICAL MEDICINE & REHABILITATION PROGRESS NOTE   Subjective/Complaints:  Per RN difficulty sleeping although pt does not note this  Pt aware of weather forecast today , not oriented to day   ROS: Denies CP, SOB, N/V/D   Objective:   No results found. Recent Labs    12/21/19 0548  WBC 12.2*  HGB 12.6*  HCT 40.3  PLT 617*   Recent Labs    12/21/19 0548  NA 134*  K 4.4  CL 101  CO2 22  GLUCOSE 123*  BUN 28*  CREATININE 1.61*  CALCIUM 9.6    Intake/Output Summary (Last 24 hours) at 12/23/2019 0707 Last data filed at 12/23/2019 0119 Gross per 24 hour  Intake 380 ml  Output 550 ml  Net -170 ml     Physical Exam: Vital Signs Blood pressure 96/65, pulse 87, temperature 99 F (37.2 C), resp. rate 14, height 5\' 7"  (1.702 m), weight 71 kg, SpO2 98 %.   General: No acute distress Mood and affect are appropriate Heart: Regular rate and rhythm no rubs murmurs or extra sounds Lungs: Clear to auscultation, breathing unlabored, no rales or wheezes Abdomen: Positive bowel sounds, soft nontender to palpation, nondistended Extremities: No clubbing, cyanosis, or edema Skin: No evidence of breakdown, no evidence of rash   Motor:  LUE/LLE: 0/5 proximal to distal , increased tone in left pectoralis  Right lean  Assessment/Plan: 1. 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  Physiatrist is providing close team supervision and 24 hour management of active medical problems listed below.  Physiatrist and rehab team continue to assess barriers to discharge/monitor patient progress toward functional and medical goals  Care Tool:  Bathing    Body parts bathed by patient: Left arm, Chest, Abdomen, Front perineal area, Right upper leg, Left upper leg, Face, Right lower leg   Body parts bathed by helper: Buttocks, Left lower leg, Right arm     Bathing  assist Assist Level: Moderate Assistance - Patient 50 - 74% (sitting on shower seat)     Upper Body Dressing/Undressing Upper body dressing Upper body dressing/undressing activity did not occur (including orthotics): N/A What is the patient wearing?: Pull over shirt    Upper body assist Assist Level: Maximal Assistance - Patient 25 - 49%    Lower Body Dressing/Undressing Lower body dressing      What is the patient wearing?: Incontinence brief, Pants     Lower body assist Assist for lower body dressing: Maximal Assistance - Patient 25 - 49%     Toileting Toileting    Toileting assist Assist for toileting: Set up assist Assistive Device Comment: urinal (assist placing urinal, pt will hold)   Transfers Chair/bed transfer  Transfers assist  Chair/bed transfer activity did not occur: N/A  Chair/bed transfer assist level: Moderate Assistance - Patient 50 - 74%     Locomotion Ambulation   Ambulation assist   Ambulation activity did not occur: Safety/medical concerns  Assist level: 2 helpers Assistive device: Hand held assist Max distance: 10'   Walk 10 feet activity   Assist  Walk 10 feet activity did not occur: Safety/medical concerns  Assist level: 2 helpers Assistive device: Hand held assist   Walk 50 feet activity   Assist Walk 50 feet with 2 turns activity did not occur: Safety/medical concerns  Assist level: 2 helpers Assistive device: Lite Gait    Walk 150 feet  activity   Assist Walk 150 feet activity did not occur: Safety/medical concerns         Walk 10 feet on uneven surface  activity   Assist Walk 10 feet on uneven surfaces activity did not occur: Safety/medical concerns         Wheelchair     Assist Will patient use wheelchair at discharge?: No (No PT lomg term goals set)             Wheelchair 50 feet with 2 turns activity    Assist            Wheelchair 150 feet activity     Assist           Blood pressure 96/65, pulse 87, temperature 99 F (37.2 C), resp. rate 14, height 5\' 7"  (1.702 m), weight 71 kg, SpO2 98 %.  Medical Problem List and Plan: 1.  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  Antithrombotics: -DVT/anticoagulation: Subcutaneous heparin initiated 12/11/2019,dopplers neg              -antiplatelet therapy: N/A 3. Pain Management: Tylenol as needed,  Post CVA HA start topirimate 25mg  BID  4. Mood: Provide emotional support             -antipsychotic agents: N/A 5. Neuropsych: This patient is Not   Fully capable of making complex decisions on his own behalf. 6. Skin/Wound Care: Routine skin checks 7. Fluids/Electrolytes/Nutrition: Routine in and outs. 8.  Post stroke dysphagia.  Dysphagia #2 thin liquids.  Follow-up speech therapy.  Advance diet as tolerated 9.  Left lung pneumonia.  Intravenous Unasyn completed 10.  Hypertension.  Norvasc 10 mg daily, Coreg 25 mg twice daily, clonidine 0.3 mg twice daily, hydralazine 100 mg every 8 hours, lisinopril 10 mg twice daily.   Vitals:   12/22/19 2108 12/23/19 0324  BP: 119/76 96/65  Pulse:  87  Resp:  14  Temp:  99 F (37.2 C)  SpO2:  98%   Controlled on 7/8, actually now on low side will reduce hydralazine              Monitor with increased mobility 11.  Urine drug screen positive marijuana.  Counseled on appropriate  12.  Pre renal azotemia worsening  will enc fluid, given IVF x1 bolus  Labs ordered for tomorrow 13.  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  This will likely occur more often at night.  Monitor for signs of alcohol withdrawal although drinking pattern was mainly on the weekends. 14.  Insomnia trial Ambien  Appears to be improving 15.  Leukocytosis Fevers x 1 but no recurrence , monitor off abx Ucx with 40,000 growth, UA clean.  Does not appear symptomatic at this  time,  16.  Transaminitis  LFTs elevated on 7/1, repeat 7/5 , normal   LOS: 8 days A FACE TO FACE EVALUATION WAS PERFORMED  [name] 12/23/2019, 7:07 AM

## 2019-12-23 NOTE — Progress Notes (Signed)
Physical Therapy Session Note  Patient Details  Name: Dennis Mercado MRN: [id number] Date of Birth: [birth year]  Today's Date: 12/23/2019 PT Individual Time: 1164-3539 PT Individual Time Calculation (min): 30 min   Short Term Goals: Week 1:  PT Short Term Goal 1 (Week 1): Pt will perform supine<>sit with mod assist PT Short Term Goal 1 - Progress (Week 1): Progressing toward goal PT Short Term Goal 2 (Week 1): Pt will perform bed<>chair transfer with max assist (not using lift equipment) PT Short Term Goal 2 - Progress (Week 1): Met PT Short Term Goal 3 (Week 1): Pt will initiate gait training PT Short Term Goal 3 - Progress (Week 1): Met PT Short Term Goal 4 (Week 1): Pt will participate in sitting balance assessment PT Short Term Goal 4 - Progress (Week 1): Progressing toward goal Week 2:  PT Short Term Goal 1 (Week 2):   Skilled Therapeutic Interventions/Progress Updates:    PAIN Pt denies pain this pm, perseverating on need to urinate.  Pt initially supine and appears agitated w/perseveration on interaction w/ "some guy" /repeated mult times story relating to conflict regarding racing knowledge/colors.  Pt redirected mult times.  Attempted to use urinal w/total assist of therapist for set up/pt unable despite mult requests during session. Pt initially refusing stating he is having "writing therapy" this pm.  Pt redirected/agreed to sit up "to try urinal".  Pt supine to sit w/max assist.  Min to mod assist for balance while propping w/RUE.  STS from bed and spt to wc w/mod to max assist of 1.  Pt continued w/innner distractions as above/redirected to task. STS from wc performed 3x w/mod to max assist, static stand w/therapist stabilizing LLE/max assist for balance.  Last effort pt performed gait 80f w/max assist of 2/ theapist advanced and stabilized LLE/cues to attend to task, cues to increase step length on R.   Turn/sit to edge of bed max assist of 2.  Sit to supine w/max  assist.  In supine performed bilat clamshells using resistance to RLE w/overflow to L w/approx 50% AROM resulting x 8-9 reps. Glut sets in bridge position x 8/pt unable to attend to task w/further efforts Pt left supine w/rails up x 4, alarm set, bed in lowest position, and needs in reach.  Therapy Documentation Precautions:  Precautions Precautions: Fall Precaution Comments: L UE/LE severe paresis with impaired sensation; impaired safety awareness Restrictions Weight Bearing Restrictions: No   Therapy/Group: Individual Therapy  BCallie Fielding PSweeny7/01/2020, 4:07 PM

## 2019-12-23 NOTE — Progress Notes (Signed)
Occupational Therapy Session Note  Patient Details  Name: Dennis Mercado MRN: [id number] Date of Birth: Jan 14, 1981  Today's Date: 12/23/2019 OT Individual Time: 8850-2774 OT Individual Time Calculation (min): 46 min    Short Term Goals: Week 2:  OT Short Term Goal 1 (Week 2): Pt will complete UB dressing with min assist following hemi dressing techniques. OT Short Term Goal 2 (Week 2): Pt will donn pants and brief with mod assist following hemi techniques for two consecutive sessions. OT Short Term Goal 3 (Week 2): Pt will complete sit to stand with min assist during LB selfcare tasks. OT Short Term Goal 4 (Week 2): Pt will use the LUE as a stabilizer with max assist hand over hand during selfcare tasks.  Skilled Therapeutic Interventions/Progress Updates:    Pt completed supine to sit EOB with max assist.  Max assist was needed for initiation of bed mobility as well as for sustained attention as he would keep his eyes closed.  Increased confusion noted with pt stating he was on a plane last night and that he currently is in the [place].  Max instructional cueing for re-direction as he did not believe therapist or rehab tech.  The [place] was used for transfer into the shower with total assist secondary to time.  He needed max assist for removal of brief and gown with overall mod assist for completion of bathing tasks sit to stand.  Mod instructional cueing for sequencing and sustained attention as when therapist would present him with a soaped up washcloth, he would be unaware and state "I need to put some soap on this.".  He needed max hand over hand assist for integration of the LUE during bathing tasks.  Increased tone noted in the chest and lats resulting in shoulder adduction and internal rotation.  He completed stand pivot transfer out of the shower to the tilt in space wheelchair with mod assist.  Had pt work on dressing tasks with overall max assist to complete UB and LB dressing.  He  demonstrated decreased perceptual deficits when attempting to sequence donning shirt as well as when attempting to place his arms or legs into the clothing.  Mod assist for sit to stand from the wheelchair to pull items over her hips.  Finished session with therapist assist to donn his TEDS and shoes.  Finished session with pt in the wheelchair with call button in reach and NT in room to assist with breakfast.    Therapy Documentation Precautions:  Precautions Precautions: Fall Precaution Comments: L UE/LE severe paresis with impaired sensation; impaired safety awareness Restrictions Weight Bearing Restrictions: No  Pain: Pain Assessment Pain Scale: 0-10 Pain Score: 2  Faces Pain Scale: Hurts a little bit Pain Type: Acute pain Pain Location: Arm Pain Orientation: Left Pain Descriptors / Indicators: Discomfort Pain Onset: With Activity Pain Intervention(s): Repositioned;Emotional support ADL: See Care Tool Section for some details of mobility and selfcare  Therapy/Group: Individual Therapy  Jabes Primo OTR/L 12/23/2019, 12:05 PM

## 2019-12-23 NOTE — Progress Notes (Signed)
Physical Therapy Weekly Progress Note  Patient Details  Name: Dennis Mercado MRN: [id number] Date of Birth: 02/18/1981  Beginning of progress report period: December 16, 2019 End of progress report period: December 23, 2019  Today's Date: 12/23/2019 PT Individual Time: 0952-1050 PT Individual Time Calculation (min): 58 min   Patient has met 2 of 4 short term goals.  [name] is making slower than anticipated progress due to significant cognitive and visual/perceptual impairments effecting attention, awareness, processing, and motor planning. He is performing supine>sitting with max assist and sit>supine with mod assist. Sit<>stands and squat pivots with mod assist. Has progressed to gait training via 3 Musketeer support with +2 mod assist requiring total assist to advance L LE during swing and prevent buckle during stance. He continues to demonstrate L inattention, poor midline awareness, significant L hemibody paresis, and impaired trunk control. He continues to require skilled physical therapy at CIR level to maximize progress to increase independence and decrease BOC.   Patient continues to demonstrate the following deficits muscle weakness, muscle joint tightness and muscle paralysis, decreased cardiorespiratoy endurance, impaired timing and sequencing, abnormal tone, unbalanced muscle activation and decreased motor planning, decreased visual perceptual skills and decreased visual motor skills, decreased midline orientation and decreased attention to left, decreased initiation, decreased attention, decreased awareness, decreased problem solving, decreased safety awareness, decreased memory and delayed processing and decreased sitting balance, decreased standing balance, decreased postural control and decreased balance strategies and therefore will continue to benefit from skilled PT intervention to increase functional independence with mobility.  Patient progressing toward long term goals..  Continue plan  of care.  PT Short Term Goals Week 1:  PT Short Term Goal 1 (Week 1): Pt will perform supine<>sit with mod assist PT Short Term Goal 1 - Progress (Week 1): Progressing toward goal PT Short Term Goal 2 (Week 1): Pt will perform bed<>chair transfer with max assist (not using lift equipment) PT Short Term Goal 2 - Progress (Week 1): Met PT Short Term Goal 3 (Week 1): Pt will initiate gait training PT Short Term Goal 3 - Progress (Week 1): Met PT Short Term Goal 4 (Week 1): Pt will participate in sitting balance assessment PT Short Term Goal 4 - Progress (Week 1): Progressing toward goal Week 2:  PT Short Term Goal 1 (Week 2): Pt will perform supine<>sit with mod assist PT Short Term Goal 2 (Week 2): Pt will perform sit<>stand with min assist PT Short Term Goal 3 (Week 2): Pt will perform squat pivot transfers with min assist  Skilled Therapeutic Interventions/Progress Updates:  Ambulation/gait training;Community reintegration;DME/adaptive equipment instruction;Neuromuscular re-education;Psychosocial support;Stair training;UE/LE Strength taining/ROM;Wheelchair propulsion/positioning;Balance/vestibular training;Discharge planning;Functional electrical stimulation;Pain management;Skin care/wound management;UE/LE Coordination activities;Therapeutic Activities;Cognitive remediation/compensation;Disease management/prevention;Functional mobility training;Patient/family education;Splinting/orthotics;Therapeutic Exercise;Visual/perceptual remediation/compensation   Pt received sitting in TIS w/c and agreeable to therapy session. Pt talking about "making money" reporting he is thinking about that instead of the bad dream he had.  Transported to/from gym in w/c for time management and energy conservation. Sit<>stands to/from TIS with with mod assist for lifting and balance as pt has poor trunk control and midline awareness leaning in all directions in standing. Donned L LE ankle DF assist ACE wrap. Gait training  with 3 Musketeer support ~34f with +2 mod assist and total assist for L LE management during swing to advance LE and stance to prevent buckling - pt demonstrates anterior hips bowing his back backwards during gait. Sitting in w/c>tall kneeling on mat with +2 max assist for balance and total assist  for L LE management onto mat. In tall kneeling, with L UE supported on bench and mirror feedback focused on midline orientation, trunk control, and hip muscle activation - requires max assist to maintain upright once LOB occurs otherwise mod assist - max cuing for alignment as pt has L hip backwards with L lateral trunk lean or pt pushes hips forwards and bows his trunk backwards. Tall kneeling>R sidelying with max assist and +2 assist to move bench. Pt reports need to use bathroom. Sidelying>sitting EOM with max assist for L hemibody management and trunk upright. L squat pivot EOM>w/c mod assist lifting and pivoting hips - mod cuing head/hips relationship.  Transported from gym in w/c. Sit>stand w/c>B UE support heavy mod assist while +2 assisted with LB clothing and urinal management but pt reports he can't urinate using urinal and needs to "see the toilet." Stedy transfer w/c>toilet - mod assist for balance during sit<>stand in stedy. Therapist left bathroom leaving male therapy tech with pt because pt reports he cannot urinate with people watching him but required assistance of 1 person for safety due to L lean on toilet and poor safety awareness. Despite increased time pt continues to report he cannot void with someone present - reports he will just void in his brief as that is what he has been doing - therapist educated on importance of progressing to continent voiding in urinal or toilet. Stedy transfer back to w/c. Sit>stand w/c>sink mod assist for lifting and balance - L UE supportted on sink - targeting midline orientation and L attention via visual scanning to locate items on sink and identify the item that  does not belong in the bathroom (i.e. [employer] sauce) - required repeated cuing for L visual scanning and to recover midline as pt still has L lateral trunk lean with R hip shift. L squat pivot w/c>EOB mod assist for lifting/pivoting hips - mod cuing for sequencing. Sit>supine mod assist for B LE management into bed. Pt left supine in bed with needs in reach and bed alarm on.   Pt noticed to have increased irritation/poorer frustration tolerance, more impaired focus to tasks, and more impaired sustained attention with decreased ability to be redirected during mobility tasks.  Therapy Documentation Precautions:  Precautions Precautions: Fall Precaution Comments: L UE/LE severe paresis with impaired sensation; impaired safety awareness Restrictions Weight Bearing Restrictions: No  Pain: Reports L LE is "numb and painful" reporting these symptoms are worse during ambulation and they have been present for ~3 days - provided seated rest breaks, repositioning, and modification of therapy interventions for pain management.  Therapy/Group: Individual Therapy  Tawana Scale, PT, DPT, CSRS  12/23/2019, 7:55 AM

## 2019-12-23 NOTE — Progress Notes (Signed)
Speech Language Pathology Weekly Progress and Session Note  Patient Details  Name: Dennis Mercado MRN: [id number] Date of Birth: 1981-05-08  Beginning of progress report period: December 16, 2019 End of progress report period: December 23, 2019  Today's Date: 12/23/2019 SLP Individual Time: 1300-1355 SLP Individual Time Calculation (min): 55 min  Short Term Goals: Week 1: SLP Short Term Goal 1 (Week 1): Pt will consume current diet with efficient mastication and oral clearance and min overt s/sx aspiration with Min A verbal/visual cues for use of compensatory swallow strategies to minimize anterior loss and oral clearance. SLP Short Term Goal 1 - Progress (Week 1): Met SLP Short Term Goal 2 (Week 1): Pt will demonstrate efficient mastication and oral clearance of Dys 3 textures over 3 sessions prior to advancement. SLP Short Term Goal 2 - Progress (Week 1): Met SLP Short Term Goal 3 (Week 1): Pt will demonstrate ability to problem solve basic situations with Mod A verbal/visual cues. SLP Short Term Goal 3 - Progress (Week 1): Progressing toward goal SLP Short Term Goal 4 (Week 1): Pt will sustain attention to functional tasks with Mod A verbal/visual cues. SLP Short Term Goal 4 - Progress (Week 1): Progressing toward goal SLP Short Term Goal 5 (Week 1): Pt will recall new and/or daily information with Mod A cues for use of strategies/aids. SLP Short Term Goal 5 - Progress (Week 1): Progressing toward goal SLP Short Term Goal 6 (Week 1): Pt will detect functional errors with Max A multimodal cues. SLP Short Term Goal 6 - Progress (Week 1): Progressing toward goal    New Short Term Goals: Week 2: SLP Short Term Goal 1 (Week 2): Pt will consume current diet with efficient mastication and oral clearance and min overt s/sx aspiration with Supervision A verbal/visual cues for use of compensatory swallow strategies to minimize anterior loss and oral clearance. SLP Short Term Goal 2 (Week 2): Pt will  demonstrate ability to problem solve basic situations with Mod A verbal/visual cues. SLP Short Term Goal 3 (Week 2): Pt will sustain attention to functional tasks for 3-5 minute intervals with Mod A verbal/visual cues. SLP Short Term Goal 4 (Week 2): Pt will recall new and/or daily information with Mod A cues for use of strategies/aids. SLP Short Term Goal 5 (Week 2): Pt will detect functional errors with Max A multimodal cues.  Weekly Progress Updates: Pt has made slow functional gains this week , and ultimately met 2 out of 6 short term goals. Pt is currently Max assist for basic cognitive tasks due to impairments most severely impacting his attention and emergent awareness, as well as deficits in problem solving and short term memory. Pt's level of arousal and participation has also fluctuated this week.  Pt is consuming an upgraded dysphagia 3 (mechanical soft) diet with thin liquids and is using strategies for safe swallowing and clearance of left buccal pocketing with Min A verbal cues. Pt has demonstrated improved independence in use of swallow strategies this week. Pt and family education is ongoing. Pt would continue to benefit from skilled ST while inpatient in order to maximize functional independence and reduce burden of care prior to discharge. Anticipate that pt will need 24/7 supervision at discharge in addition to [facility] follow up at next level of care.      Intensity: Minumum of 1-2 x/day, 30 to 90 minutes Frequency: 3 to 5 out of 7 days Duration/Length of Stay: 01/12/20 Treatment/Interventions: Cognitive remediation/compensation;Cueing hierarchy;Dysphagia/aspiration precaution training;Functional tasks;Patient/family education;Therapeutic Activities;Internal/external  aids;Environmental controls   Daily Session  Skilled Therapeutic Interventions: Pt was seen for skilled ST targeting dysphagia and cognitive goals. Upon arrival, pt had not eaten his lunch. With extra time and Mod A  auditory stimulation, he aroused and was agreeable to eat lunch. Pt with relatively limited overall quantity of intake, but efficient mastication and oral clearance of dysphagia 3 (mech soft) lunch items observed. He required Min A verbal cues for use of strategies to clear left buccal cavity. No overt s/sx aspiration observed across solid or thin liquid intake. Recommend continue current diet.  Pt continues to be highly internally distracted and presenting with increased language of confusion and confabulation today, requiring Max A verbal cues for redirection to all tasks during session for 10-30 second intervals. Mod A provided for visual scanning to locate items left of midline both during functional and structured tasks. Although SLP again attempted to assist pt with use of urinal when he expressed need to void, he was ultimately unable to produce any urine and required Max A for functional problem solving. Pt attempted to complete 1 basic PEG board design, requiring Max A for problem solving and error awareness. Spoke with RN and PA regarding SLP's perceived decline in overall cognitive functioning, increase in distractibility, confabulation, and difficulty maintaining arousal. Pt left laying in bed with alarm set and needs within reach. Continue per current plan of care.         Pain Pain Assessment Pain Scale: 0-10 Pain Score: 6  Pain Type: Acute pain Pain Location: Head Pain Orientation: Posterior Pain Descriptors / Indicators: Headache Pain Onset: On-going Patients Stated Pain Goal: 0 Pain Intervention(s): Medication (See eMAR) Multiple Pain Sites: No  Therapy/Group: Individual Therapy  Arbutus Leas 12/23/2019, 7:05 AM

## 2019-12-23 NOTE — Progress Notes (Signed)
Recreational Therapy Session Note  Patient Details  Name: Dennis Mercado MRN: [id number] Date of Birth: 11-03-1980 Today's Date: 12/23/2019  Pain: no c/o Skilled Therapeutic Interventions/Progress Updates: Attempted eval completion.  Pt sleeping soundly, snoring.  Pt did respond to his name being called but did not open eyes or further participate in conversation.  Previous therapist said participation in session was limited.  TR eval deferred at this time, will follow up next week.  Java 12/23/2019, 2:16 PM

## 2019-12-24 ENCOUNTER — Inpatient Hospital Stay (HOSPITAL_COMMUNITY): Payer: BC Managed Care – PPO | Admitting: Physical Therapy

## 2019-12-24 ENCOUNTER — Inpatient Hospital Stay (HOSPITAL_COMMUNITY): Payer: BC Managed Care – PPO | Admitting: Occupational Therapy

## 2019-12-24 ENCOUNTER — Encounter: Payer: Self-pay | Admitting: Family Medicine

## 2019-12-24 ENCOUNTER — Inpatient Hospital Stay (HOSPITAL_COMMUNITY): Payer: BC Managed Care – PPO | Admitting: Speech Pathology

## 2019-12-24 ENCOUNTER — Encounter (HOSPITAL_COMMUNITY): Payer: BC Managed Care – PPO | Admitting: Psychology

## 2019-12-24 MED ORDER — QUETIAPINE FUMARATE 50 MG PO TABS
50.0000 mg | ORAL_TABLET | Freq: Every day | ORAL | Status: DC
  Administered 2019-12-24 – 2020-01-11 (×19): 50 mg via ORAL
  Filled 2019-12-24 (×3): qty 1
  Filled 2019-12-24: qty 2
  Filled 2019-12-24 (×4): qty 1
  Filled 2019-12-24: qty 2
  Filled 2019-12-24 (×10): qty 1

## 2019-12-24 NOTE — Progress Notes (Signed)
[facility] PHYSICAL MEDICINE & REHABILITATION PROGRESS NOTE   Subjective/Complaints: Still not sleeping well slept about 2 hours which is actually better than the previous night, sister at bedside helping with feeding  ROS: Denies CP, SOB, N/V/D   Objective:   No results found. No results for input(s): WBC, HGB, HCT, PLT in the last 72 hours. No results for input(s): NA, K, CL, CO2, GLUCOSE, BUN, CREATININE, CALCIUM in the last 72 hours.  Intake/Output Summary (Last 24 hours) at 12/24/2019 0944 Last data filed at 12/23/2019 2020 Gross per 24 hour  Intake 120 ml  Output --  Net 120 ml     Physical Exam: Vital Signs Blood pressure 128/87, pulse 91, temperature 98.6 F (37 C), resp. rate 18, height 5\' 7"  (1.702 m), weight 71 kg, SpO2 99 %.  General: No acute distress, tired Mood and affect are appropriate Heart: Regular rate and rhythm no rubs murmurs or extra sounds Lungs: Clear to auscultation, breathing unlabored, no rales or wheezes Abdomen: Positive bowel sounds, soft nontender to palpation, nondistended Extremities: No clubbing, cyanosis, or edema  Motor:  LUE/LLE: 0/5 proximal to distal , increased tone in left pectoralis  Right lean  Assessment/Plan: 1. 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  Physiatrist is providing close team supervision and 24 hour management of active medical problems listed below.  Physiatrist and rehab team continue to assess barriers to discharge/monitor patient progress toward functional and medical goals  Care Tool:  Bathing    Body parts bathed by patient: Left arm, Chest, Abdomen, Front perineal area, Buttocks, Right upper leg, Left upper leg, Right lower leg, Left lower leg, Face   Body parts bathed by helper: Right arm     Bathing assist Assist Level: Moderate Assistance - Patient 50 - 74%     Upper Body  Dressing/Undressing Upper body dressing Upper body dressing/undressing activity did not occur (including orthotics): N/A What is the patient wearing?: Pull over shirt    Upper body assist Assist Level: Maximal Assistance - Patient 25 - 49%    Lower Body Dressing/Undressing Lower body dressing      What is the patient wearing?: Incontinence brief, Pants     Lower body assist Assist for lower body dressing: Maximal Assistance - Patient 25 - 49%     Toileting Toileting    Toileting assist Assist for toileting: Set up assist Assistive Device Comment: urinal (assist placing urinal, pt will hold)   Transfers Chair/bed transfer  Transfers assist  Chair/bed transfer activity did not occur: N/A  Chair/bed transfer assist level: Moderate Assistance - Patient 50 - 74% (squat pivot)     Locomotion Ambulation   Ambulation assist   Ambulation activity did not occur: Safety/medical concerns  Assist level: 2 helpers Assistive device: Other (comment) (3 Musketeer) Max distance: 25ft   Walk 10 feet activity   Assist  Walk 10 feet activity did not occur: Safety/medical concerns  Assist level: 2 helpers Assistive device: Other (comment) (3 Musketeer)   Walk 50 feet activity   Assist Walk 50 feet with 2 turns activity did not occur: Safety/medical concerns  Assist level: 2 helpers Assistive device: Lite Gait    Walk 150 feet activity   Assist Walk 150 feet activity did not occur: Safety/medical concerns         Walk 10 feet on uneven surface  activity   Assist Walk 10 feet on uneven  surfaces activity did not occur: Safety/medical concerns         Wheelchair     Assist Will patient use wheelchair at discharge?: No (No PT lomg term goals set)             Wheelchair 50 feet with 2 turns activity    Assist            Wheelchair 150 feet activity     Assist          Blood pressure 128/87, pulse 91, temperature 98.6 F (37 C),  resp. rate 18, height 5\' 7"  (1.702 m), weight 71 kg, SpO2 99 %.  Medical Problem List and Plan: 1.  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  Antithrombotics: -DVT/anticoagulation: Subcutaneous heparin initiated 12/11/2019,dopplers neg              -antiplatelet therapy: N/A 3. Pain Management: Tylenol as needed,  Post CVA HA start topirimate 25mg  BID, still has right-sided neck pain but no headache pain today 4. Mood: Provide emotional support             -antipsychotic agents: N/A 5. Neuropsych: This patient is Not   Fully capable of making complex decisions on his own behalf. 6. Skin/Wound Care: Routine skin checks 7. Fluids/Electrolytes/Nutrition: Routine in and outs. 8.  Post stroke dysphagia.  Dysphagia #2 thin liquids.  Follow-up speech therapy.  Advance diet as tolerated 9.  Left lung pneumonia.  Intravenous Unasyn completed 10.  Hypertension.  Norvasc 10 mg daily, Coreg 25 mg twice daily, clonidine 0.3 mg twice daily, hydralazine 100 mg every 8 hours, lisinopril 10 mg twice daily.   Vitals:   12/23/19 2149 12/24/19 0359  BP: 108/78 128/87  Pulse:  91  Resp:  18  Temp:  98.6 F (37 C)  SpO2:  99%   Controlled on 7/8, actually now on low side will reduce hydralazine              Monitor with increased mobility 11.  Urine drug screen positive marijuana.  Counseled on appropriate  12.  Pre renal azotemia worsening  will enc fluid, given IVF x1 bolus  Labs ordered for tomorrow 13.  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  This will likely occur more often at night.  Monitor for signs of alcohol withdrawal although drinking pattern was mainly on the weekends. 14.  Insomnia trial Seroquel increased to 50 mg nightly 15.  Leukocytosis Fevers x 1 but no recurrence , monitor off abx Ucx with 40,000 growth, UA clean. Probable contaminant does not appear  symptomatic at this time,  16.  Transaminitis  LFTs elevated on 7/1, repeat 7/5 , normal   LOS: 9 days A FACE TO FACE EVALUATION WAS PERFORMED  [name] 12/24/2019, 9:44 AM

## 2019-12-24 NOTE — Plan of Care (Signed)
  Problem: Consults Goal: RH STROKE PATIENT EDUCATION Description: See Patient Education module for education specifics  Outcome: Progressing   Problem: RH BLADDER ELIMINATION Goal: RH STG MANAGE BLADDER WITH ASSISTANCE Description: STG Manage Bladder With min Assistance Outcome: Progressing   Problem: RH SKIN INTEGRITY Goal: RH STG SKIN FREE OF INFECTION/BREAKDOWN Description: Pt will be free of skin breakdown/infection with min assist during CIR stay Outcome: Progressing   Problem: RH SAFETY Goal: RH STG ADHERE TO SAFETY PRECAUTIONS W/ASSISTANCE/DEVICE Description: STG Adhere to Safety Precautions With cues/reminders Assistance/Device. Outcome: Progressing   Problem: RH KNOWLEDGE DEFICIT Goal: RH STG INCREASE KNOWLEDGE OF HYPERTENSION Description: Pt/family will be able to demonstrate knowledge of HTN management with medications and diet precautions and ways to monitor blood pressure with min assist using handouts/booklets provided by staff Outcome: Progressing Goal: RH STG INCREASE KNOWLEGDE OF HYPERLIPIDEMIA Description: Pt/family will be able to demonstrate knowledge of HLD management with medications and diet precautions with min assist using handouts/booklets provided by staff Outcome: Progressing Goal: RH STG INCREASE KNOWLEDGE OF STROKE PROPHYLAXIS Description: Pt/family will be able to demonstrate knowledge of stroke prevention with medications and diet precautions and ways to monitor signs/symptoms of stroke with min assist using handouts/booklets provided by staff Outcome: Progressing   Problem: Consults Goal: RH STROKE PATIENT EDUCATION Description: See Patient Education module for education specifics  Outcome: Progressing Goal: Nutrition Consult-if indicated Outcome: Progressing Goal: Diabetes Guidelines if Diabetic/Glucose > 140 Description: If diabetic or lab glucose is > 140 mg/dl - Initiate Diabetes/Hyperglycemia Guidelines & Document Interventions  Outcome:  Progressing   Problem: RH BOWEL ELIMINATION Goal: RH STG MANAGE BOWEL WITH ASSISTANCE Description: STG Manage Bowel with Assistance. Outcome: Progressing Goal: RH STG MANAGE BOWEL W/MEDICATION W/ASSISTANCE Description: STG Manage Bowel with Medication with Assistance. Outcome: Progressing   Problem: RH BLADDER ELIMINATION Goal: RH STG MANAGE BLADDER WITH ASSISTANCE Description: STG Manage Bladder With Assistance Outcome: Progressing Goal: RH STG MANAGE BLADDER WITH MEDICATION WITH ASSISTANCE Description: STG Manage Bladder With Medication With Assistance. Outcome: Progressing Goal: RH STG MANAGE BLADDER WITH EQUIPMENT WITH ASSISTANCE Description: STG Manage Bladder With Equipment With Assistance Outcome: Progressing   Problem: RH SKIN INTEGRITY Goal: RH STG SKIN FREE OF INFECTION/BREAKDOWN Outcome: Progressing Goal: RH STG MAINTAIN SKIN INTEGRITY WITH ASSISTANCE Description: STG Maintain Skin Integrity With Assistance. Outcome: Progressing Goal: RH STG ABLE TO PERFORM INCISION/WOUND CARE W/ASSISTANCE Description: STG Able To Perform Incision/Wound Care With Assistance. Outcome: Progressing   Problem: RH SAFETY Goal: RH STG ADHERE TO SAFETY PRECAUTIONS W/ASSISTANCE/DEVICE Description: STG Adhere to Safety Precautions With Assistance/Device. Outcome: Progressing Goal: RH STG DECREASED RISK OF FALL WITH ASSISTANCE Description: STG Decreased Risk of Fall With Assistance. Outcome: Progressing   Problem: RH COGNITION-NURSING Goal: RH STG USES MEMORY AIDS/STRATEGIES W/ASSIST TO PROBLEM SOLVE Description: STG Uses Memory Aids/Strategies With Assistance to Problem Solve. Outcome: Progressing Goal: RH STG ANTICIPATES NEEDS/CALLS FOR ASSIST W/ASSIST/CUES Description: STG Anticipates Needs/Calls for Assist With Assistance/Cues. Outcome: Progressing   Problem: RH PAIN MANAGEMENT Goal: RH STG PAIN MANAGED AT OR BELOW PT'S PAIN GOAL Outcome: Progressing   Problem: RH KNOWLEDGE  DEFICIT Goal: RH STG INCREASE KNOWLEDGE OF DIABETES Outcome: Progressing Goal: RH STG INCREASE KNOWLEDGE OF HYPERTENSION Outcome: Progressing Goal: RH STG INCREASE KNOWLEDGE OF DYSPHAGIA/FLUID INTAKE Outcome: Progressing Goal: RH STG INCREASE KNOWLEGDE OF HYPERLIPIDEMIA Outcome: Progressing Goal: RH STG INCREASE KNOWLEDGE OF STROKE PROPHYLAXIS Outcome: Progressing

## 2019-12-24 NOTE — Progress Notes (Signed)
Occupational Therapy Session Note  Patient Details  Name: Dennis Mercado MRN: [id number] Date of Birth: [birth year]  Today's Date: 12/24/2019 OT Individual Time: 0802-0903 OT Individual Time Calculation (min): 61 min    Short Term Goals: Week 2:  OT Short Term Goal 1 (Week 2): Pt will complete UB dressing with min assist following hemi dressing techniques. OT Short Term Goal 2 (Week 2): Pt will donn pants and brief with mod assist following hemi techniques for two consecutive sessions. OT Short Term Goal 3 (Week 2): Pt will complete sit to stand with min assist during LB selfcare tasks. OT Short Term Goal 4 (Week 2): Pt will use the LUE as a stabilizer with max assist hand over hand during selfcare tasks.  Skilled Therapeutic Interventions/Progress Updates:    Pt in bed to start session working on breakfast with his sister present as well.  He was agreeable to transfer to the EOB for therapy with max instructional cueing and max assist.  Once on the EOB, he was able to maintain static sitting balance with supervision.  Noted pt with urine soaked shirt in the back but his brief was dry.  Agreed for completion of shower based on this.  The Stedy was utilized for transfer to the shower with min assist for sit to stand.  He needed max assist for removal of brief and pullover shirt.  Max instructional cueing to maintain eyes opened during shower and to maintain visual attention on the LUE when attempting to wash it or integrate it into bathing.  Max assist is needed to integrate the LUE as a stabilizer for applying soap to the washcloth and for using it to wash the RUE.  He needed max instructional cueing for sequencing as he is disorganized and will skip around to wash body parts, forgetting the LLE or the left arm initially.  After drying off he completed stand pivot transfer to the wheelchair where he was then positioned at the sink for dressing tasks.  He needed max assist for sequencing and completion  of UB dressing as well as for donning his brief and short.  Slight increased pain in the left hip was noted when crossing and placing it over the right knee for washing and dressing.  He was able to again complete sit to stand for pulling garments over his hips with a light mod assist level.  Therapist applied TEDs to the LEs and then he was able to donn his zip up shoes with mod assist overall.  Finished session with pt in the wheelchair and NMES applied to the left shoulder for treatment of subluxation.  He tolerated stimulation on level 8 for 60 mins without any adverse reactions.  Resting hand splint was also placed on the LUE for positioning of the wrist, hand, and digits.  See below for parameters of NMES.  Call button and phone in reach with safety belt in and telemonitor in place.   Sa[place]o Stim One 330 pulse width 35 Hz pulse rate On 8 sec/ off 8 sec Ramp up/ down 2 sec Symmetrical Biphasic wave form  Max intensity 169mA at 500 Ohm load    Therapy Documentation Precautions:  Precautions Precautions: Fall Precaution Comments: L UE/LE severe paresis with impaired sensation; impaired safety awareness Restrictions Weight Bearing Restrictions: No  Pain: Pain Assessment Pain Scale: Faces Faces Pain Scale: Hurts a little bit Pain Type: Acute pain Pain Location: Head Pain Orientation: Right;Anterior Pain Descriptors / Indicators: Discomfort;Headache Pain Onset: On-going Pain Intervention(s): RN  made aware;Emotional support ADL: See Care Tool Section for some details of mobility and selfcare  Therapy/Group: Individual Therapy  Landrie Beale OTR/L 12/24/2019, 10:47 AM

## 2019-12-24 NOTE — Consult Note (Signed)
Neuropsychological Consultation   Patient:   Dennis Mercado   DOB:   Dec 19, 1980  MR Number:  [id number]  Location:  [facility] [id number] [place] [address] Dept: [place]: [phone]           Date of Service:   12/24/2019  Start Time:   9 AM End Time:   10 AM  Provider/Observer:  Ilean Skill, Psy.D.       Clinical Neuropsychologist       Billing Code/Service: [phone]  Chief Complaint:    Dennis Mercado is a 39 year old male with documented history of hypertension on no current antihypertensive medications.  Patient presented on 12/06/2019 with left hemiparesis and altered mental status.  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  Hemorrhage extended into the ventricles and could be seen in the third and fourth ventricles without hydrocephalus.  Local mass-effect without midline shift.  Urine drug screen was positive for marijuana but no other substances.  Hemorrhage was stable and treated conservatively.  Hospital course was complicated by fevers presumed to be secondary to large left lung opacity concerning for pneumonia.  Reason for Service:  Patient was referred for neuropsychological consultation due to ongoing cognitive deficits.  Confusion with confabulation response, impaired memory and learning and significant executive functioning deficits.  Below is the HPI for the current admission.  HPI: Dennis Mercado is a 39 year old right-handed male with documented history of hypertension on no current antihypertensive medications.  History taken from chart review and wife due to cognition.  Patient lives with spouse.  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  1 level home one-step to entry.  He presented on 12/06/2019 with left hemiparesis and AMS during sexual activity.  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  Per report,  measuring 3.6 x 2.8 x 2.6 cm.  Hemorrhage extends into the ventricles and could be seen in the third and fourth ventricles without hydrocephalus.  Local mass-effect without midline shift.  CT angiogram of head and neck normal variant CTA circle of Willis without significant proximal stenosis aneurysm or branch vessel occlusion.  Admission chemistries potassium 3.2, creatinine 1.52, urine drug screen positive marijuana.  Echocardiogram with ejection fraction of 65%, no wall motion abnormalities.  EEG negative for seizures.  Carotid Dopplers unremarkable.  Venous Dopplers upper extremity negative for DVT.  Follow-up CT, personally reviewed, stable hemorrhage.  Unchanged size of right thalamic intraparenchymal hematoma with mild surrounding edema.  Hospital course further complicated by fevers presumed to be secondary to large left lung opacity concerning for pneumonia.  He completed course of Unasyn.  Dysphagia #2 thin liquids.  Cleviprex ongoing for blood pressure control since discontinued.  He was cleared to begin subcutaneous heparin for DVT prophylaxis 12/11/2019.  Therapy evaluations completed and patient was admitted for a comprehensive rehab program.  Please see preadmission assessment from earlier today as well.  Current Status:  Upon entering the room today the patient was sitting up in the wheelchair with safety equipment for impulsivity in place and the patient being video monitor.  The patient was confused with significant deficits in mental status and orientation.  Patient reported that he had not seen doctors even though he has had multiple physicians rounding on him over the past 2 days.  The patient was confused as to his location and confused as to what had happened as far as his intracranial bleed.  Significant  motor deficits were noted as well as deficits in new learning.  Expressive language was adequate but there are severe executive functioning deficits noted.   Behavioral  Observation: Dennis Mercado  presents as a 39 y.o.-year-old Right African American Male who appeared his stated age. his dress was Appropriate and he was Well Groomed and his manners were Appropriate, inappropriate to the situation.  his participation was indicative of Inattentive behaviors.  There were any physical disabilities noted.  he displayed an inappropriate level of cooperation and motivation.     Interactions:    Minimal Inattentive  Attention:   abnormal and attention span appeared shorter than expected for age  Memory:   abnormal; global memory impairment noted  Visuo-spatial:  not examined  Speech (Volume):  low  Speech:   normal; normal  Thought Process:  Tangential and Disorganized  Though Content:  Rumination; not suicidal and not homicidal  Orientation:   person  Judgment:   Poor  Planning:   Poor  Affect:    Not Congruent  Mood:    Dysphoric  Insight:   Shallow  Intelligence:   normal   Medical History:  History reviewed. No pertinent past medical history.          Psychiatric History:  No known prior psychiatric history  Family Med/Psych History: History reviewed. No pertinent family history.  Risk of Suicide/Violence: low no indication of any suicidal homicidal ideation the patient has significant and severe confusion.  Impression/DX:  Dennis Mercado is a 39 year old male with documented history of hypertension on no current antihypertensive medications.  Patient presented on 12/06/2019 with left hemiparesis and altered mental status.  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  Hemorrhage extended into the ventricles and could be seen in the third and fourth ventricles without hydrocephalus.  Local mass-effect without midline shift.  Urine drug screen was positive for marijuana but no other substances.  Hemorrhage was stable and treated conservatively.  Hospital course was complicated by fevers presumed to be secondary to large left lung  opacity concerning for pneumonia.  Upon entering the room today the patient was sitting up in the wheelchair with safety equipment for impulsivity in place and the patient being video monitor.  The patient was confused with significant deficits in mental status and orientation.  Patient reported that he had not seen doctors even though he has had multiple physicians rounding on him over the past 2 days.  The patient was confused as to his location and confused as to what had happened as far as his intracranial bleed.  Significant motor deficits were noted as well as deficits in new learning.  Expressive language was adequate but there are severe executive functioning deficits noted.  Disposition/Plan:  Will follow up with the patient next week although it may take some time before his mental status and orientation is sufficient to get a lot accomplished.  Probably should be at the end of next week when I see him the next time.         Electronically Signed   _______________________ Ilean Skill, Psy.D.

## 2019-12-24 NOTE — Progress Notes (Signed)
Speech Language Pathology Daily Session Note  Patient Details  Name: Dennis Mercado MRN: [id number] Date of Birth: Oct 20, 1980  Today's Date: 12/24/2019 SLP Individual Time: 6808-8110 SLP Individual Time Calculation (min): 57 min  Short Term Goals: Week 2: SLP Short Term Goal 1 (Week 2): Pt will consume current diet with efficient mastication and oral clearance and min overt s/sx aspiration with Supervision A verbal/visual cues for use of compensatory swallow strategies to minimize anterior loss and oral clearance. SLP Short Term Goal 2 (Week 2): Pt will demonstrate ability to problem solve basic situations with Mod A verbal/visual cues. SLP Short Term Goal 3 (Week 2): Pt will sustain attention to functional tasks for 3-5 minute intervals with Mod A verbal/visual cues. SLP Short Term Goal 4 (Week 2): Pt will recall new and/or daily information with Mod A cues for use of strategies/aids. SLP Short Term Goal 5 (Week 2): Pt will detect functional errors with Max A multimodal cues.  Skilled Therapeutic Interventions: Pt was seen for skilled ST targeting cognition. Pt demonstrated increase in verbal agitation with SLP today, continues to confabulate when speaking and intermittently becomes perseverative on irrelevant topics. Some language of confusion evident in addition to confabulation. Once pt transferred from chair to bed via stedy, pt's verbal agitation decreased and he was able to engage in basic sorting task targeting problem solving, emergent awareness, and sustained attention. Max faded to Mod A verbal/visual cues required. Pt left sitting in bd with alarm set and needs within reach, wife present. Continue per current plan of care.           Pain Pain Assessment Pain Scale: Faces Faces Pain Scale: Hurts little more Pain Type: Acute pain Pain Location: Leg Pain Descriptors / Indicators: Sore Pain Onset: Gradual Pain Intervention(s): Repositioned Multiple Pain Sites: No  Therapy/Group:  Individual Therapy  Arbutus Leas 12/24/2019, 7:15 AM

## 2019-12-24 NOTE — Progress Notes (Signed)
Pt slept a total of 2 hours through the night.

## 2019-12-24 NOTE — Care Management (Signed)
Patient ID: Dennis Mercado, male   DOB: [birth year], 39 y.o.   MRN: [id number] Met with wife to review questions about "sauces/condiments" and seasonings.  Handouts given on recommended low sodium alternatives and healthy condiments. Wife noted an understanding of information reviewed. Margarito Liner

## 2019-12-24 NOTE — Progress Notes (Signed)
Physical Therapy Session Note  Patient Details  Name: Dennis Mercado MRN: [id number] Date of Birth: 04-19-1981  Today's Date: 12/24/2019 PT Individual Time: 1106-1209 PT Individual Time Calculation (min): 63 min   Short Term Goals: Week 2:  PT Short Term Goal 1 (Week 2): Pt will perform supine<>sit with mod assist PT Short Term Goal 2 (Week 2): Pt will perform sit<>stand with min assist PT Short Term Goal 3 (Week 2): Pt will perform squat pivot transfers with min assist  Skilled Therapeutic Interventions/Progress Updates:    Pt received sitting in TIS w/c in wind-swept hip posture - pt agreeable to therapy session. Therapist assisted pt with repositioning in chair for increased safety.  Transported to/from gym in w/c for time management and energy conservation. L squat pivot w/c>EOM mod assist for lifting/pivoting hips - cuing to grab therapist's arm with R hand to decrease compensation during transfer and increase L LE WBing. Sitting balance task focusing on L attention with dual-cognitive task to visually scan and locate items on L, grab with R hand, and perform simple addition/subtraction - requires min/mod assist for trunk control when leaning L to grab items with R hand - therapist manually placing L UE for weightbearing - becomes irritated with difficulty of cognitive component when therapist having pt recreate color pattern. During sitting balance task pt intermittently and spontaneously leans R onto R forearm to rest. When sitting if pt using R UE support for balance noticed to have R trunk lengthening with shortening of L trunk. Sit<>stands to/from EOM with mod assist for balance and total assist manual facilitation for L LE knee extension - mod assist for balance - manual facilitation and max cuing for midline orientation using mirror feedback - continues to have R weight shift bias with excessive trunk extension pushing hips forward requiring cuing for improvement. L squat pivot to w/c with  mod assist as described above. Donned L LE ankle DF assist ACE wrap. Gait training ~26ft with L UE over therapist shoulders and R HHA of 2nd person with +2 heavy mod assist for balance and total assist for L LE management during advancement of swing and blocking knee buckle in stance - pt continues to have excessive trunk extension with hips pushing forward during gait 3rd person provided w/c for safety. Donned tennis shoes and re-applied ACE wrap. Gait training ~53ft with L UE over therapists shoulder and R HHA of 2nd person - +2 mod assist for balance - while wearing shoes able to provide improved positioning and management of L LE; continues to require total assist for L LE during swing and stance. Transported back to room and pt's wife, April, present. Therapist educated her on importance of performing tilt back pressure relief in TIS w/c every 84minutes-1hour for a full minute - showed her how to perform safely and she demonstrated understanding. Educated her on using pillows to support L UE while sitting in w/c. Pt left with needs in reach, seat belt alarm on, and his wife present.  Therapy Documentation Precautions:  Precautions Precautions: Fall Precaution Comments: L UE/LE severe paresis with impaired sensation; impaired safety awareness Restrictions Weight Bearing Restrictions: No  Pain: Continues to reorts neck pain, continues to demonstrate guarded movement of neck avoiding rotation. Reports L UE and L LE pain during session with movement and increasing with weightbearing - provided repositioning and rest breaks for pain management. RN notified and reports medication administered prior to session - pt made aware.   Therapy/Group: Individual Therapy  [place],  PT, DPT, CSRS  12/24/2019, 7:55 AM

## 2019-12-25 ENCOUNTER — Inpatient Hospital Stay (HOSPITAL_COMMUNITY): Payer: BC Managed Care – PPO

## 2019-12-25 ENCOUNTER — Inpatient Hospital Stay (HOSPITAL_COMMUNITY): Payer: BC Managed Care – PPO | Admitting: Physical Therapy

## 2019-12-25 DIAGNOSIS — G441 Vascular headache, not elsewhere classified: Secondary | ICD-10-CM

## 2019-12-25 DIAGNOSIS — D62 Acute posthemorrhagic anemia: Secondary | ICD-10-CM

## 2019-12-25 DIAGNOSIS — I952 Hypotension due to drugs: Secondary | ICD-10-CM

## 2019-12-25 DIAGNOSIS — E871 Hypo-osmolality and hyponatremia: Secondary | ICD-10-CM

## 2019-12-25 NOTE — Progress Notes (Signed)
Occupational Therapy Session Note  Patient Details  Name: Dennis Mercado MRN: [id number] Date of Birth: [birth year]  Today's Date: 12/25/2019 OT Individual Time: 1100-1200 OT Individual Time Calculation (min): 60 min    Short Term Goals: Week 1:  OT Short Term Goal 1 (Week 1): Pt will maintain static sitting balance EOB with min assist in preparation for selfcare or transfers. OT Short Term Goal 1 - Progress (Week 1): Met OT Short Term Goal 2 (Week 1): Pt will complete bathing sit to stand with mod assist from supported position. OT Short Term Goal 2 - Progress (Week 1): Met OT Short Term Goal 3 (Week 1): Pt will complete UB dressing with min assist following hemi dressing techniques. OT Short Term Goal 3 - Progress (Week 1): Not met OT Short Term Goal 4 (Week 1): Pt will complete toilet transfer with mod assist squat pivot to drop arm commode. OT Short Term Goal 4 - Progress (Week 1): Met OT Short Term Goal 5 (Week 1): Pt will return demonstrate self PROM exercises for the LUE during session with min assist following handout. OT Short Term Goal 5 - Progress (Week 1): Met  Skilled Therapeutic Interventions/Progress Updates:    Pt received supine in bed initially declining therapy due to upset from not eating breakfast (slept through it) and being "woke up with a cold cloth." OT provided warm cloth to neck/face with pt assisting to wash face 50%. Provided max encouragement for sitting EOB from OT and NT, however pt declined with minimal eye opening. Utilized pictures in room to facilitate conversation and motivation with transfer OOB. OT provided PROM exercises to LUE/LLE while engaging in conversation with slight pain in L hip reported. With time, pt agreeable to don pants requiring max A and able to complete fair- bridge to manage around waist. Transitioned to sitting EOB with mod A and HOB elevated. Pt demonstrating static sitting EOB for 20-30 second increments. Provided juice while sitting  EOB with pt taking 6 sips with no signs of aspiration. Max encouragement to transition to w/c, however pt continued to decline. Completed scooting to R side in preparation of sit>supine with mod A. Pt left supine in bed with HOB elevated and LUE positioned properly on pillow.   Therapy Documentation Precautions:  Precautions Precautions: Fall Precaution Comments: L UE/LE severe paresis with impaired sensation; impaired safety awareness Restrictions Weight Bearing Restrictions: No General:   Vital Signs:   Pain: Pain Assessment Pain Scale: 0-10 Pain Score: Asleep Pain Type: Acute pain Pain Location: Head Pain Orientation: Right;Posterior Pain Descriptors / Indicators: Sore Pain Onset: Gradual Pain Intervention(s): Repositioned;RN made aware;Rest ADL: ADL Eating: Moderate assistance Where Assessed-Eating: Bed level Grooming: Moderate assistance Where Assessed-Grooming: Bed level Upper Body Bathing: Maximal assistance Where Assessed-Upper Body Bathing: Edge of bed Lower Body Bathing: Maximal assistance Where Assessed-Lower Body Bathing: Edge of bed, Bed level Upper Body Dressing: Maximal assistance Where Assessed-Upper Body Dressing: Edge of bed Lower Body Dressing: Maximal assistance Where Assessed-Lower Body Dressing: Edge of bed Toileting: Dependent Where Assessed-Toileting: Bedside Commode Toilet Transfer: Dependent Toilet Transfer Method: Arts development officer: Art gallery manager    Praxis   Exercises:   Other Treatments:     Therapy/Group: Individual Therapy  Duayne Cal 12/25/2019, 12:05 PM

## 2019-12-25 NOTE — Progress Notes (Signed)
Physical Therapy Session Note  Patient Details  Name: Dennis Mercado MRN: [id number] Date of Birth: [birth year]  Today's Date: 12/25/2019 PT Individual Time: 8786-7672 PT Individual Time Calculation (min): 34 min   Short Term Goals: Week 2:  PT Short Term Goal 1 (Week 2): Pt will perform supine<>sit with mod assist PT Short Term Goal 2 (Week 2): Pt will perform sit<>stand with min assist PT Short Term Goal 3 (Week 2): Pt will perform squat pivot transfers with min assist  Skilled Therapeutic Interventions/Progress Updates:    Pt received L sidelying in bed with HOB maximally elevated - pt agreeable to therapy session. Pt then perseverating on the fact that he didn't get breakfast and is very hungry - able to redirect and pt agreeable to sit EOB to eat. Supine>sitting R EOB with max assist for B LE management and trunk upright - max cuing to use R UE to assist with trunk upright as pt slow to initiate assisting with this movement. Sitting EOB with min assist for trunk control while +2 assist donned shoes max assist. Participated in sitting EOB trunk control and L attention while therapist engaged pt in L UE NMR via manually facilitating pt holding fruit cup while feeding with  RUE - able to maintain static sitting balance during this task with close supervision. Cuing for small bites, to clear mouth fully, and minimize distraction - no signs of aspiration. NT present reporting pt needs to attempt voiding bladder - pt agreeable to allow this therapist and NT to assist with toileting. L squat pivot EOB>w/c mod assist for lifting/pivoting hips - pt demonstrating improved motor planning for this task. Transported in/out bathroom in TIS. Sit>stand w/c>grab bar with mod assist for lifting and balance - standing with min/mod assist (L knee blocked into extension) while +2 assist performed total assist LB clothing and urinal management (+void). Transported back into room and pt agreeable to remain in w/c - left  tilted back with needs in reach, seat belt alarm on, and L UE supported on pillows.  Therapy Documentation Precautions:  Precautions Precautions: Fall Precaution Comments: L UE/LE severe paresis with impaired sensation; impaired safety awareness Restrictions Weight Bearing Restrictions: No  Pain: Reports headache pain - nursing reports recent medication administration.   Therapy/Group: Individual Therapy  Tawana Scale , PT, DPT, CSRS  12/25/2019, 6:14 PM

## 2019-12-25 NOTE — Progress Notes (Signed)
Patient presented with yellow MEWS for respirations and SBP. Patient had just gotten out of bed. patient allowed to rest and vitals reassessed. Patient vitals stabilized. Charge nurse aware.

## 2019-12-25 NOTE — Plan of Care (Signed)
  Problem: Consults Goal: RH STROKE PATIENT EDUCATION Description: See Patient Education module for education specifics  Outcome: Progressing   Problem: RH BLADDER ELIMINATION Goal: RH STG MANAGE BLADDER WITH ASSISTANCE Description: STG Manage Bladder With min Assistance Outcome: Progressing   Problem: RH SKIN INTEGRITY Goal: RH STG SKIN FREE OF INFECTION/BREAKDOWN Description: Pt will be free of skin breakdown/infection with min assist during CIR stay Outcome: Progressing   Problem: RH SAFETY Goal: RH STG ADHERE TO SAFETY PRECAUTIONS W/ASSISTANCE/DEVICE Description: STG Adhere to Safety Precautions With cues/reminders Assistance/Device. Outcome: Progressing   Problem: RH KNOWLEDGE DEFICIT Goal: RH STG INCREASE KNOWLEDGE OF HYPERTENSION Description: Pt/family will be able to demonstrate knowledge of HTN management with medications and diet precautions and ways to monitor blood pressure with min assist using handouts/booklets provided by staff Outcome: Progressing Goal: RH STG INCREASE KNOWLEGDE OF HYPERLIPIDEMIA Description: Pt/family will be able to demonstrate knowledge of HLD management with medications and diet precautions with min assist using handouts/booklets provided by staff Outcome: Progressing Goal: RH STG INCREASE KNOWLEDGE OF STROKE PROPHYLAXIS Description: Pt/family will be able to demonstrate knowledge of stroke prevention with medications and diet precautions and ways to monitor signs/symptoms of stroke with min assist using handouts/booklets provided by staff Outcome: Progressing   Problem: Consults Goal: RH STROKE PATIENT EDUCATION Description: See Patient Education module for education specifics  Outcome: Progressing Goal: Nutrition Consult-if indicated Outcome: Progressing Goal: Diabetes Guidelines if Diabetic/Glucose > 140 Description: If diabetic or lab glucose is > 140 mg/dl - Initiate Diabetes/Hyperglycemia Guidelines & Document Interventions  Outcome:  Progressing   Problem: RH BOWEL ELIMINATION Goal: RH STG MANAGE BOWEL WITH ASSISTANCE Description: STG Manage Bowel with Assistance. Outcome: Progressing Goal: RH STG MANAGE BOWEL W/MEDICATION W/ASSISTANCE Description: STG Manage Bowel with Medication with Assistance. Outcome: Progressing   Problem: RH BLADDER ELIMINATION Goal: RH STG MANAGE BLADDER WITH ASSISTANCE Description: STG Manage Bladder With Assistance Outcome: Progressing Goal: RH STG MANAGE BLADDER WITH MEDICATION WITH ASSISTANCE Description: STG Manage Bladder With Medication With Assistance. Outcome: Progressing Goal: RH STG MANAGE BLADDER WITH EQUIPMENT WITH ASSISTANCE Description: STG Manage Bladder With Equipment With Assistance Outcome: Progressing   Problem: RH SKIN INTEGRITY Goal: RH STG SKIN FREE OF INFECTION/BREAKDOWN Outcome: Progressing Goal: RH STG MAINTAIN SKIN INTEGRITY WITH ASSISTANCE Description: STG Maintain Skin Integrity With Assistance. Outcome: Progressing Goal: RH STG ABLE TO PERFORM INCISION/WOUND CARE W/ASSISTANCE Description: STG Able To Perform Incision/Wound Care With Assistance. Outcome: Progressing   Problem: RH SAFETY Goal: RH STG ADHERE TO SAFETY PRECAUTIONS W/ASSISTANCE/DEVICE Description: STG Adhere to Safety Precautions With Assistance/Device. Outcome: Progressing Goal: RH STG DECREASED RISK OF FALL WITH ASSISTANCE Description: STG Decreased Risk of Fall With Assistance. Outcome: Progressing   Problem: RH COGNITION-NURSING Goal: RH STG USES MEMORY AIDS/STRATEGIES W/ASSIST TO PROBLEM SOLVE Description: STG Uses Memory Aids/Strategies With Assistance to Problem Solve. Outcome: Progressing Goal: RH STG ANTICIPATES NEEDS/CALLS FOR ASSIST W/ASSIST/CUES Description: STG Anticipates Needs/Calls for Assist With Assistance/Cues. Outcome: Progressing   Problem: RH PAIN MANAGEMENT Goal: RH STG PAIN MANAGED AT OR BELOW PT'S PAIN GOAL Outcome: Progressing   Problem: RH KNOWLEDGE  DEFICIT Goal: RH STG INCREASE KNOWLEDGE OF DIABETES Outcome: Progressing Goal: RH STG INCREASE KNOWLEDGE OF HYPERTENSION Outcome: Progressing Goal: RH STG INCREASE KNOWLEDGE OF DYSPHAGIA/FLUID INTAKE Outcome: Progressing Goal: RH STG INCREASE KNOWLEGDE OF HYPERLIPIDEMIA Outcome: Progressing Goal: RH STG INCREASE KNOWLEDGE OF STROKE PROPHYLAXIS Outcome: Progressing

## 2019-12-25 NOTE — Progress Notes (Signed)
Speech Language Pathology Daily Session Note  Patient Details  Name: Gene Glazebrook MRN: [id number] Date of Birth: 1980-10-23  Today's Date: 12/25/2019 SLP Individual Time: 0805-0830 SLP Individual Time Calculation (min): 25 min  Short Term Goals: Week 2: SLP Short Term Goal 1 (Week 2): Pt will consume current diet with efficient mastication and oral clearance and min overt s/sx aspiration with Supervision A verbal/visual cues for use of compensatory swallow strategies to minimize anterior loss and oral clearance. SLP Short Term Goal 2 (Week 2): Pt will demonstrate ability to problem solve basic situations with Mod A verbal/visual cues. SLP Short Term Goal 3 (Week 2): Pt will sustain attention to functional tasks for 3-5 minute intervals with Mod A verbal/visual cues. SLP Short Term Goal 4 (Week 2): Pt will recall new and/or daily information with Mod A cues for use of strategies/aids. SLP Short Term Goal 5 (Week 2): Pt will detect functional errors with Max A multimodal cues.  Skilled Therapeutic Interventions: Skilled ST services focused on swallow skills. SLP and Nurse facilitated PO consumption of medication whole with thin liquids via straw. Pt initialed chewed medication and then swallowed without lliquid, but with max A verbal cues was able to swallow with SLP providing thin liquid. Pt demonstrated intermittent oral holding and multiple swallows, likely due to inattention. SLP facilitated self-feeding of thin liquid via straw, pt demonstrated timely swallows in the remainder of consumed liquids. Pt was lethargic and demonstrated reduced sustained and focused attention, eyes remaining closed last half of the treatment session. Pt was orientated to situation, but then became agitated with questions due to onset of headache pain. SLP attempted to assist pt with PO intake of dys 3 and thin liquid breakfast tray, however pt refused due to pain. Pt requested the light be turned off and to lay back  down due to pain, SLP provided repositioning and rest. Pt was left in room with call bell within reach and bed alarm set. SLP notified NT of head pain and NT called Nurse. ST recommends to continue skilled ST services.      Pain Pain Assessment Pain Score: 10-Worst pain ever Pain Type: Acute pain Pain Location: Head Pain Orientation: Right;Posterior Pain Descriptors / Indicators: Sore Pain Onset: Gradual Pain Intervention(s): Repositioned;RN made aware;Rest  Therapy/Group: Individual Therapy  Leverne Amrhein  [facility] 12/25/2019, 8:32 AM

## 2019-12-25 NOTE — Progress Notes (Signed)
[place] PHYSICAL MEDICINE & REHABILITATION PROGRESS NOTE   Subjective/Complaints: Patient seen laying in bed this morning.  He states he slept well overnight.  No reported issues overnight.  He is slowed and slightly perseverative on getting neck rub.  ROS: Limited by cognition, but appears to deny CP, SOB, N/V/D   Objective:   No results found. No results for input(s): WBC, HGB, HCT, PLT in the last 72 hours. No results for input(s): NA, K, CL, CO2, GLUCOSE, BUN, CREATININE, CALCIUM in the last 72 hours.  Intake/Output Summary (Last 24 hours) at 12/25/2019 1354 Last data filed at 12/24/2019 1815 Gross per 24 hour  Intake 300 ml  Output --  Net 300 ml     Physical Exam: Vital Signs Blood pressure 98/80, pulse 84, temperature 98.1 F (36.7 C), temperature source Oral, resp. rate 20, height 5\' 7"  (1.702 m), weight 71 kg, SpO2 100 %. Constitutional: No distress . Vital signs reviewed. HENT: Normocephalic.  Atraumatic. Eyes: EOMI. No discharge. Cardiovascular: No JVD. Respiratory: Normal effort.  No stridor. GI: Non-distended. Skin: Warm and dry.  Intact. Psych: Normal mood.  Normal behavior. Musc: No edema in extremities.  No tenderness in extremities. Motor: LUE/LLE: 0/5 proximal distal  Assessment/Plan: 1. 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  Physiatrist is providing close team supervision and 24 hour management of active medical problems listed below.  Physiatrist and rehab team continue to assess barriers to discharge/monitor patient progress toward functional and medical goals  Care Tool:  Bathing    Body parts bathed by patient: Left arm, Chest, Abdomen, Front perineal area, Buttocks, Right upper leg, Left upper leg, Right lower leg, Left lower leg, Face   Body parts bathed by helper: Right arm     Bathing assist Assist Level: Moderate  Assistance - Patient 50 - 74%     Upper Body Dressing/Undressing Upper body dressing Upper body dressing/undressing activity did not occur (including orthotics): N/A What is the patient wearing?: Pull over shirt    Upper body assist Assist Level: Maximal Assistance - Patient 25 - 49%    Lower Body Dressing/Undressing Lower body dressing      What is the patient wearing?: Pants     Lower body assist Assist for lower body dressing: Total Assistance - Patient < 25%     Toileting Toileting    Toileting assist Assist for toileting: Set up assist Assistive Device Comment: urinal (assist placing urinal, pt will hold)   Transfers Chair/bed transfer  Transfers assist  Chair/bed transfer activity did not occur: N/A  Chair/bed transfer assist level: Moderate Assistance - Patient 50 - 74% (squat pivot)     Locomotion Ambulation   Ambulation assist   Ambulation activity did not occur: Safety/medical concerns  Assist level: 2 helpers (+2 mod assist) Assistive device: Hand held assist Max distance: 20ft   Walk 10 feet activity   Assist  Walk 10 feet activity did not occur: Safety/medical concerns  Assist level: 2 helpers Assistive device: Hand held assist   Walk 50 feet activity   Assist Walk 50 feet with 2 turns activity did not occur: Safety/medical concerns  Assist level: 2 helpers Assistive device: Lite Gait    Walk 150 feet activity   Assist Walk 150 feet activity did not occur: Safety/medical concerns         Walk 10 feet on uneven surface  activity   Assist Walk  10 feet on uneven surfaces activity did not occur: Safety/medical concerns         Wheelchair     Assist Will patient use wheelchair at discharge?: No (No PT lomg term goals set)             Wheelchair 50 feet with 2 turns activity    Assist            Wheelchair 150 feet activity     Assist          Blood pressure 98/80, pulse 84, temperature 98.1  F (36.7 C), temperature source Oral, resp. rate 20, height 5\' 7"  (1.702 m), weight 71 kg, SpO2 100 %.  Medical Problem List and Plan: 1.  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  Antithrombotics: -DVT/anticoagulation: Subcutaneous heparin initiated 12/11/2019,dopplers neg              -antiplatelet therapy: N/A 3. Pain Management: Tylenol as needed,    Topirimate 25mg  BID  Appears controlled on 7/10 4. Mood: Provide emotional support             -antipsychotic agents: N/A 5. Neuropsych: This patient is not fully capable of making complex decisions on his own behalf. 6. Skin/Wound Care: Routine skin checks 7. Fluids/Electrolytes/Nutrition: Routine in and outs. 8.  Post stroke dysphagia.  Dysphagia #3 thins liquids.  Follow-up speech therapy.  Advance diet as tolerated 9.  Left lung pneumonia.  Intravenous Unasyn completed 10.  Hypertension.  Norvasc 10 mg daily, Coreg 25 mg twice daily, clonidine 0.3 mg twice daily  Hydralazine 100 mg every 8 hours, d/ced on 7/10  Lisinopril 10 mg twice daily.   Vitals:   12/25/19 0608 12/25/19 0609  BP: 97/79 98/80  Pulse:  84  Resp:  20  Temp:  98.1 F (36.7 C)  SpO2:  100%   Soft on 7/10             Monitor with increased mobility 11.  Urine drug screen positive marijuana.  Counseled on appropriate  12.  Pre renal azotemia worsening  will enc fluid, given IVF x1 bolus  Creatinine 1.61 on 7/6, labs ordered for Monday 13.  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  This will likely occur more often at night.  Monitor for signs of alcohol withdrawal although drinking pattern was mainly on the weekends. 14.  Sleep disturbance   Trial Seroquel increased to 50 mg nightly 15.  Leukocytosis   WBCs 12.2 on 7/6, labs ordered for Monday  Afebrile 16.  Transaminitis: Resolved 17.  Hyponatremia  Sodium 134 on 7/6  Labs ordered for  Monday 18.  Acute blood loss anemia  Hemoglobin 12.6 on 7/6, labs ordered for Monday  LOS: 10 days A FACE TO FACE EVALUATION WAS PERFORMED  Amil Bouwman Lorie Phenix 12/25/2019, 1:54 PM

## 2019-12-25 NOTE — Progress Notes (Signed)
Patient has not voided in 8 hours. He was encouraged to try. He was given fluids, but did not void. When approached to do an in/out cath, he refused stating that it was his right to refused. No acute distress noted

## 2019-12-26 MED ORDER — LISINOPRIL 5 MG PO TABS
5.0000 mg | ORAL_TABLET | Freq: Two times a day (BID) | ORAL | Status: DC
  Administered 2019-12-26 – 2019-12-28 (×4): 5 mg via ORAL
  Filled 2019-12-26 (×4): qty 1

## 2019-12-26 NOTE — Progress Notes (Signed)
Patient restless at times, however redirectable. Continues to require telesitter due to poor safety awareness. Medicated x2 with prn tylenol for c/o headache-with effectiveness noted

## 2019-12-26 NOTE — Progress Notes (Signed)
[place] PHYSICAL MEDICINE & REHABILITATION PROGRESS NOTE   Subjective/Complaints: Patient seen laying in bed this morning.  He states he did not sleep well overnight because no one helped him ordered things on the television.  When asked what he wanted, he said everything.  He did not don his orthoses overnight-discussed with nursing.  ROS: Limited by cognition, positive for headaches, chest pain, shortness of breath, vomiting  Objective:   No results found. No results for input(s): WBC, HGB, HCT, PLT in the last 72 hours. No results for input(s): NA, K, CL, CO2, GLUCOSE, BUN, CREATININE, CALCIUM in the last 72 hours.  Intake/Output Summary (Last 24 hours) at 12/26/2019 1630 Last data filed at 12/26/2019 1223 Gross per 24 hour  Intake --  Output 450 ml  Net -450 ml     Physical Exam: Vital Signs Blood pressure 110/78, pulse 91, temperature 98.8 F (37.1 C), resp. rate 19, height 5\' 7"  (1.702 m), weight 71 kg, SpO2 100 %. Constitutional: No distress . Vital signs reviewed. HENT: Normocephalic.  Atraumatic. Eyes: EOMI. No discharge. Cardiovascular: No JVD. Respiratory: Normal effort.  No stridor. GI: Non-distended. Skin: Warm and dry.  Intact. Psych: Normal mood.  Normal behavior. Musc: No edema in extremities.  No tenderness in extremities. Motor: LUE/LLE: 0/5 proximal distal, unchanged  Assessment/Plan: 1. 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  Physiatrist is providing close team supervision and 24 hour management of active medical problems listed below.  Physiatrist and rehab team continue to assess barriers to discharge/monitor patient progress toward functional and medical goals  Care Tool:  Bathing    Body parts bathed by patient: Left arm, Chest, Abdomen, Front perineal area, Buttocks, Right upper leg, Left upper leg, Right lower leg, Left lower  leg, Face   Body parts bathed by helper: Right arm     Bathing assist Assist Level: Moderate Assistance - Patient 50 - 74%     Upper Body Dressing/Undressing Upper body dressing Upper body dressing/undressing activity did not occur (including orthotics): N/A What is the patient wearing?: Pull over shirt    Upper body assist Assist Level: Maximal Assistance - Patient 25 - 49%    Lower Body Dressing/Undressing Lower body dressing      What is the patient wearing?: Pants     Lower body assist Assist for lower body dressing: Total Assistance - Patient < 25%     Toileting Toileting    Toileting assist Assist for toileting: Set up assist Assistive Device Comment: urinal (assist placing urinal, pt will hold)   Transfers Chair/bed transfer  Transfers assist  Chair/bed transfer activity did not occur: N/A  Chair/bed transfer assist level: Moderate Assistance - Patient 50 - 74%     Locomotion Ambulation   Ambulation assist   Ambulation activity did not occur: Safety/medical concerns  Assist level: 2 helpers (+2 mod assist) Assistive device: Hand held assist Max distance: 46ft   Walk 10 feet activity   Assist  Walk 10 feet activity did not occur: Safety/medical concerns  Assist level: 2 helpers Assistive device: Hand held assist   Walk 50 feet activity   Assist Walk 50 feet with 2 turns activity did not occur: Safety/medical concerns  Assist level: 2 helpers Assistive device: Lite Gait    Walk 150 feet activity   Assist Walk 150 feet activity did not occur: Safety/medical concerns  Walk 10 feet on uneven surface  activity   Assist Walk 10 feet on uneven surfaces activity did not occur: Safety/medical concerns         Wheelchair     Assist Will patient use wheelchair at discharge?: No (No PT lomg term goals set)             Wheelchair 50 feet with 2 turns activity    Assist            Wheelchair 150 feet  activity     Assist          Blood pressure 110/78, pulse 91, temperature 98.8 F (37.1 C), resp. rate 19, height 5\' 7"  (1.702 m), weight 71 kg, SpO2 100 %.  Medical Problem List and Plan: 1.  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  Antithrombotics: -DVT/anticoagulation: Subcutaneous heparin initiated 12/11/2019,dopplers neg              -antiplatelet therapy: N/A 3. Pain Management: Tylenol as needed,    Topirimate 25mg  BID  Appears controlled on 7/11 4. Mood: Provide emotional support             -antipsychotic agents: N/A 5. Neuropsych: This patient is not fully capable of making complex decisions on his own behalf. 6. Skin/Wound Care: Routine skin checks 7. Fluids/Electrolytes/Nutrition: Routine in and outs. 8.  Post stroke dysphagia.  Dysphagia #3 thins liquids.  Follow-up speech therapy.  Advance diet as tolerated 9.  Left lung pneumonia.  Intravenous Unasyn completed 10.  Hypertension.  Norvasc 10 mg daily, Coreg 25 mg twice daily, clonidine 0.3 mg twice daily  Hydralazine 100 mg every 8 hours, d/ced on 7/10  Lisinopril 10 mg twice daily, decreased to 5 twice daily on 7/11.   Vitals:   12/26/19 0754 12/26/19 1541  BP: 125/85 110/78  Pulse: 97 91  Resp:  19  Temp:  98.8 F (37.1 C)  SpO2:  100%   Relatively controlled/soft on 7/11             Monitor with increased mobility 11.  Urine drug screen positive marijuana.  Counseled on appropriate  12.  Pre renal azotemia worsening  will enc fluid, given IVF x1 bolus  Creatinine 1.61 on 7/6, labs ordered for tomorrow 13.  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  This will likely occur more often at night.  Monitor for signs of alcohol withdrawal although drinking pattern was mainly on the weekends. 14.  Sleep disturbance   Trial Seroquel increased to 50 mg nightly 15.  Leukocytosis   WBCs 12.2 on 7/6,  labs ordered for tomorrow  Afebrile 16.  Transaminitis: Resolved 17.  Hyponatremia  Sodium 134 on 7/6  Labs ordered for tomorrow 18.  Acute blood loss anemia  Hemoglobin 12.6 on 7/6, labs ordered for tomorrow  LOS: 11 days A FACE TO [place] 12/26/2019, 4:30 PM

## 2019-12-27 ENCOUNTER — Inpatient Hospital Stay (HOSPITAL_COMMUNITY): Payer: BC Managed Care – PPO | Admitting: Occupational Therapy

## 2019-12-27 ENCOUNTER — Inpatient Hospital Stay (HOSPITAL_COMMUNITY): Payer: BC Managed Care – PPO | Admitting: Speech Pathology

## 2019-12-27 ENCOUNTER — Inpatient Hospital Stay (HOSPITAL_COMMUNITY): Payer: BC Managed Care – PPO

## 2019-12-27 LAB — CBC WITH DIFFERENTIAL/PLATELET
Abs Immature Granulocytes: 0.06 10*3/uL (ref 0.00–0.07)
Basophils Absolute: 0.1 10*3/uL (ref 0.0–0.1)
Basophils Relative: 1 %
Eosinophils Absolute: 0.1 10*3/uL (ref 0.0–0.5)
Eosinophils Relative: 1 %
HCT: 39.3 % (ref 39.0–52.0)
Hemoglobin: 12.3 g/dL — ABNORMAL LOW (ref 13.0–17.0)
Immature Granulocytes: 1 %
Lymphocytes Relative: 14 %
Lymphs Abs: 1.4 10*3/uL (ref 0.7–4.0)
MCH: 26.9 pg (ref 26.0–34.0)
MCHC: 31.3 g/dL (ref 30.0–36.0)
MCV: 85.8 fL (ref 80.0–100.0)
Monocytes Absolute: 1 10*3/uL (ref 0.1–1.0)
Monocytes Relative: 10 %
Neutro Abs: 7.8 10*3/uL — ABNORMAL HIGH (ref 1.7–7.7)
Neutrophils Relative %: 73 %
Platelets: 600 10*3/uL — ABNORMAL HIGH (ref 150–400)
RBC: 4.58 MIL/uL (ref 4.22–5.81)
RDW: 14.2 % (ref 11.5–15.5)
WBC: 10.4 10*3/uL (ref 4.0–10.5)
nRBC: 0 % (ref 0.0–0.2)

## 2019-12-27 LAB — BASIC METABOLIC PANEL
Anion gap: 14 (ref 5–15)
BUN: 46 mg/dL — ABNORMAL HIGH (ref 6–20)
CO2: 21 mmol/L — ABNORMAL LOW (ref 22–32)
Calcium: 9.8 mg/dL (ref 8.9–10.3)
Chloride: 101 mmol/L (ref 98–111)
Creatinine, Ser: 1.73 mg/dL — ABNORMAL HIGH (ref 0.61–1.24)
GFR calc Af Amer: 57 mL/min — ABNORMAL LOW (ref >60)
GFR calc non Af Amer: 49 mL/min — ABNORMAL LOW (ref >60)
Glucose, Bld: 123 mg/dL — ABNORMAL HIGH (ref 70–99)
Potassium: 4.5 mmol/L (ref 3.5–5.1)
Sodium: 136 mmol/L (ref 135–145)

## 2019-12-27 NOTE — Progress Notes (Signed)
Physical Therapy Session Note  Patient Details  Name: Dennis Mercado MRN: [id number] Date of Birth: [birth year]  Today's Date: 12/27/2019 PT Individual Time: 5277-8242 PT Individual Time Calculation (min): 76 min   Short Term Goals: Week 2:  PT Short Term Goal 1 (Week 2): Pt will perform supine<>sit with mod assist PT Short Term Goal 2 (Week 2): Pt will perform sit<>stand with min assist PT Short Term Goal 3 (Week 2): Pt will perform squat pivot transfers with min assist  Skilled Therapeutic Interventions/Progress Updates:    Pt received supine in bed and agreeable to therapy session. Supine>sitting R EOB with max assist for rolling and mod assist for trunk upright - demos increased assist with R UE for trunk control. Pt noted to be incontinent of bladder (pt unaware). Sit>stand stedy with mod assist for lifting/balance due to L trunk lean- standing in stedy with min/mod assist for trunk control performed anterior and posterior peri-care with set-up assist. Sitting in stedy with CGA/min assist for trunk control - doffed shirt max assist - demos unawareness of L arm requiring total assist to manage. Stedy transfer to w/c. Sitting in w/c doffed dirty pants and donned clean pants and shoes total assist. Sit<>stand to/from w/c with mod assist for balance due to L knee buckling (due to lack of active L knee extension in standing)- demos improving trunk control while standing. Transported to/from gym in w/c for time management and energy conservation. Donned L UE GivMohr sling (not best fit due to being too small) and L LE ankle DF ACE wrap. Gait training with R HHA and L UE around therapist ~65ft x2 (seated break between) - +2 mod assist for balance - total assist for L LE advancement during swing and mmax/total assist for L knee control during stance due to buckle - pt demons improving trunk control with decreased bowing backwards.   Standing with min/mod assist of 1 person (blocking L knee buckle) while  +2 assist donned litegait harness. Stepped on/off treadmill while in litegait harness with +2 mod assist for balance and total assist for L LE management on/off treadmill.  Performed the following locomotor treadmill training trials using litegait harness to provide partial BWS while wearing L LE ankle DF ACE wrap with stockinette over shoe used to provide assist for improved gait mechanics and L UE GivMohr sling: 1st: 58min07seconds at 0.58mph totaling 3ft - total assist for L LE swing and stance phase control with pt demoing poor attention to task due to harness pinching him - stopped and attempted to readjust harness  2nd trial: 55min59seconds at 0.25mph totaling 98ft with pt demoing some improved attention to task requiring total assist for L LE advancement during swing and max/total assist for L knee extension during swing - cuing for increased R LE step length to avoid step-to pattern and increase L LE stance time - +2 assist facilitating R/L lateral weight shifting onto stance limb Stepped off treadmill and doffed harness as described above. Transported back to room and pt agreeable to remain sitting up. Left sitting tilted back in TIS w/c with needs in reach, seat belt alarm on, and pillows for L UE and LE positioning.   Therapy Documentation Precautions:  Precautions Precautions: Fall Precaution Comments: L UE/LE severe paresis with impaired sensation; impaired safety awareness Restrictions Weight Bearing Restrictions: No  Pain: Reports "burning" pain in L hand when donning GivMohr sling with pt having possible neuropathic pain - once desensitized to sling being present pt no longer having pian.  Therapy/Group: Individual Therapy  Tawana Scale, PT, DPT, CSRS  12/27/2019, 12:37 PM

## 2019-12-27 NOTE — Plan of Care (Signed)
  Problem: RH SAFETY Goal: RH STG ADHERE TO SAFETY PRECAUTIONS W/ASSISTANCE/DEVICE Description: STG Adhere to Safety Precautions With cues/reminders Assistance/Device. Outcome: Progressing   Problem: RH PAIN MANAGEMENT Goal: RH STG PAIN MANAGED AT OR BELOW PT'S PAIN GOAL Outcome: Progressing

## 2019-12-27 NOTE — Progress Notes (Signed)
[place] PHYSICAL MEDICINE & REHABILITATION PROGRESS NOTE   Subjective/Complaints: Patient asks that noise be stopped. States he has a splitting headache. Does not want anything for his headache. Says it is making his mood bad.   ROS: Limited by cognition, positive for headaches, negative for chest pain, shortness of breath, vomiting  Objective:   No results found. No results for input(s): WBC, HGB, HCT, PLT in the last 72 hours. No results for input(s): NA, K, CL, CO2, GLUCOSE, BUN, CREATININE, CALCIUM in the last 72 hours.  Intake/Output Summary (Last 24 hours) at 12/27/2019 0926 Last data filed at 12/27/2019 0439 Gross per 24 hour  Intake 120 ml  Output 950 ml  Net -830 ml     Physical Exam: Vital Signs Blood pressure 113/82, pulse 90, temperature 98.9 F (37.2 C), temperature source Oral, resp. rate 18, height 5\' 7"  (1.702 m), weight 71 kg, SpO2 100 %. General: In distress from headaches.  HEENT: Head is normocephalic, atraumatic, PERRLA, EOMI, sclera anicteric, oral mucosa pink and moist, dentition intact, ext ear canals clear,  Neck: Supple without JVD or lymphadenopathy Heart: Reg rate and rhythm. No murmurs rubs or gallops Chest: CTA bilaterally without wheezes, rales, or rhonchi; no distress Abdomen: Soft, non-tender, non-distended, bowel sounds positive. Extremities: No clubbing, cyanosis, or edema. Pulses are 2+ Skin: Clean and intact without signs of breakdown Motor: LUE/LLE: 0/5 proximal distal, unchanged  Assessment/Plan: 1. 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  Physiatrist is providing close team supervision and 24 hour management of active medical problems listed below.  Physiatrist and rehab team continue to assess barriers to discharge/monitor patient progress toward functional and medical goals  Care Tool:  Bathing    Body parts  bathed by patient: Left arm, Chest, Abdomen, Front perineal area, Buttocks, Right upper leg, Left upper leg, Right lower leg, Left lower leg, Face   Body parts bathed by helper: Right arm     Bathing assist Assist Level: Moderate Assistance - Patient 50 - 74%     Upper Body Dressing/Undressing Upper body dressing Upper body dressing/undressing activity did not occur (including orthotics): N/A What is the patient wearing?: Pull over shirt    Upper body assist Assist Level: Maximal Assistance - Patient 25 - 49%    Lower Body Dressing/Undressing Lower body dressing      What is the patient wearing?: Pants     Lower body assist Assist for lower body dressing: Total Assistance - Patient < 25%     Toileting Toileting    Toileting assist Assist for toileting: Set up assist Assistive Device Comment: urinal (assist placing urinal, pt will hold)   Transfers Chair/bed transfer  Transfers assist  Chair/bed transfer activity did not occur: N/A  Chair/bed transfer assist level: Moderate Assistance - Patient 50 - 74%     Locomotion Ambulation   Ambulation assist   Ambulation activity did not occur: Safety/medical concerns  Assist level: 2 helpers (+2 mod assist) Assistive device: Hand held assist Max distance: 6ft   Walk 10 feet activity   Assist  Walk 10 feet activity did not occur: Safety/medical concerns  Assist level: 2 helpers Assistive device: Hand held assist   Walk 50 feet activity   Assist Walk 50 feet with 2 turns activity did not occur: Safety/medical concerns  Assist level: 2 helpers Assistive device: Lite Gait    Walk 150 feet activity   Assist  Walk 150 feet activity did not occur: Safety/medical concerns         Walk 10 feet on uneven surface  activity   Assist Walk 10 feet on uneven surfaces activity did not occur: Safety/medical concerns         Wheelchair     Assist Will patient use wheelchair at discharge?: No (No PT  lomg term goals set)             Wheelchair 50 feet with 2 turns activity    Assist            Wheelchair 150 feet activity     Assist          Blood pressure 113/82, pulse 90, temperature 98.9 F (37.2 C), temperature source Oral, resp. rate 18, height 5\' 7"  (1.702 m), weight 71 kg, SpO2 100 %.  Medical Problem List and Plan: 1.  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  Antithrombotics: -DVT/anticoagulation: Subcutaneous heparin initiated 12/11/2019,dopplers neg              -antiplatelet therapy: N/A 3. Pain Management: Tylenol as needed,    Topirimate 25mg  BID  Uncontrolled 7/12: patient does not want any interventions. Closed door to minimize noise.  4. Mood: Provide emotional support             -antipsychotic agents: N/A 5. Neuropsych: This patient is not fully capable of making complex decisions on his own behalf. 6. Skin/Wound Care: Routine skin checks 7. Fluids/Electrolytes/Nutrition: Routine in and outs. 8.  Post stroke dysphagia.  Dysphagia #3 thins liquids.  Follow-up speech therapy.  Advance diet as tolerated 9.  Left lung pneumonia.  Intravenous Unasyn completed 10.  Hypertension.  Norvasc 10 mg daily, Coreg 25 mg twice daily, clonidine 0.3 mg twice daily  Hydralazine 100 mg every 8 hours, d/ced on 7/10  Lisinopril 10 mg twice daily, decreased to 5 twice daily on 7/11.   Vitals:   12/26/19 2006 12/27/19 0400  BP: 108/73 113/82  Pulse: 93 90  Resp: 18 18  Temp: 98 F (36.7 C) 98.9 F (37.2 C)  SpO2: 100% 100%   7/12: BP is better controlled.              Monitor with increased mobility 11.  Urine drug screen positive marijuana.  Counseled on appropriate  12.  Pre renal azotemia worsening  will enc fluid, given IVF x1 bolus  Creatinine 1.61 on 7/6, not yet resulted 7/12 13.  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  This  will likely occur more often at night.  Monitor for signs of alcohol withdrawal although drinking pattern was mainly on the weekends. 14.  Sleep disturbance   Trial Seroquel increased to 50 mg nightly 15.  Leukocytosis   WBCs 12.2 on 7/6, not yet resulted 7/12  Afebrile 16.  Transaminitis: Resolved 17.  Hyponatremia  Sodium 134 on 7/6, not yet resulted 7/12. 18.  Acute blood loss anemia  Hemoglobin 12.6 on 7/6, not yet resulted 7/12  LOS: 12 days A FACE TO [place] 12/27/2019, 9:26 AM

## 2019-12-27 NOTE — Progress Notes (Signed)
Speech Language Pathology Daily Session Note  Patient Details  Name: Dennis Mercado MRN: [id number] Date of Birth: 1980-06-18  Today's Date: 12/27/2019 SLP Individual Time: 0730-0825 SLP Individual Time Calculation (min): 55 min  Short Term Goals: Week 2: SLP Short Term Goal 1 (Week 2): Pt will consume current diet with efficient mastication and oral clearance and min overt s/sx aspiration with Supervision A verbal/visual cues for use of compensatory swallow strategies to minimize anterior loss and oral clearance. SLP Short Term Goal 2 (Week 2): Pt will demonstrate ability to problem solve basic situations with Mod A verbal/visual cues. SLP Short Term Goal 3 (Week 2): Pt will sustain attention to functional tasks for 3-5 minute intervals with Mod A verbal/visual cues. SLP Short Term Goal 4 (Week 2): Pt will recall new and/or daily information with Mod A cues for use of strategies/aids. SLP Short Term Goal 5 (Week 2): Pt will detect functional errors with Max A multimodal cues.  Skilled Therapeutic Interventions: Pt was seen for skilled ST targeting dysphagia and cognitive goals. Pt consumed current diet Dys 3 breakfast solids and thin liquids with no overt s/sx aspiration. Pt was very lethargic, and intake was most efficient when provided assistance with self-feeding for 75% of meal. Assistance self feeding also facilitated greater attention to functional task this morning. He used strategies for oral clearance of left buccal pocketing with Supervision A verbal cues. Pt still internally distracting and exhibits confabulation, however slight improvement in sustained attention and overall participation noted. Pt required Mod A verbal and visual cues for visual scanning and functional problem solving during a 3-step action card sequencing task. Pt left laying in bed with alarm set and needs within reach. Continue per current plan of care.          Pain Pain Assessment Pain Scale: Faces Faces Pain  Scale: No hurt   Therapy/Group: Individual Therapy  Arbutus Leas 12/27/2019, 7:48 AM

## 2019-12-27 NOTE — Progress Notes (Signed)
Occupational Therapy Session Note  Patient Details  Name: Dennis Mercado MRN: [id number] Date of Birth: 02/05/1981  Today's Date: 12/27/2019 OT Individual Time: 1004-1103 OT Individual Time Calculation (min): 59 min    Short Term Goals: Week 2:  OT Short Term Goal 1 (Week 2): Pt will complete UB dressing with min assist following hemi dressing techniques. OT Short Term Goal 2 (Week 2): Pt will donn pants and brief with mod assist following hemi techniques for two consecutive sessions. OT Short Term Goal 3 (Week 2): Pt will complete sit to stand with min assist during LB selfcare tasks. OT Short Term Goal 4 (Week 2): Pt will use the LUE as a stabilizer with max assist hand over hand during selfcare tasks.  Skilled Therapeutic Interventions/Progress Updates:    Pt completed transfer from supine to sit EOB with max assist for transfer into the shower via the [facility].  Max instructional cueing to sequence bed mobility with pt completing sit to stand in the [place] with mod assist.  He was then rolled into the shower where his brief was removed with max assist.  He worked on bathing sit to stand with max instructional cueing for sequencing washing as well as for application of soap on the washcloth.  He needed max hand over hand assist for washing the RUE using the LUE.  Mod assist was needed for dynamic sitting balance as he lost his balance to both the right and the left when attempting to wash his lower legs and feet.  Mod assist for sit to stand from the shower bench.  He transferred max assist to the wheelchair for dressing at the sink.  Max assist for all dressing with pt demonstrating increased difficulty perceptually seeing the parts of his clothing and understanding the sequence.  He was able to state that he needed to put his pants on the LLE first, but then attempted to place the LLE in the right leg opening.  Mod assist for standing to pull items over his hips.  Therapist assisted with donning  TEDs and then pt donn the right shoe with setup and the left shoe with mod assist.  Finished session with pt tilted back in the wheelchair with call button and phone in reach with safety belt in place and [place] NMES applied to the left deltoid on level 8 intensity for 60 mins to address subluxation.  He was able to tolerate for 60 mins without any adverse reactions or pain.  See below for parameters for stimulation. [place]o Stim One 330 pulse width 35 Hz pulse rate On 8 sec/ off 8 sec Ramp up/ down 2 sec Symmetrical Biphasic wave form  Max intensity 161mA at 500 Ohm load   Therapy Documentation Precautions:  Precautions Precautions: Fall Precaution Comments: L UE/LE severe paresis with impaired sensation; impaired safety awareness Restrictions Weight Bearing Restrictions: No   Pain: Pain Assessment Pain Scale: Faces Pain Score: 0-No pain Faces Pain Scale: No hurt ADL: See Care Tool Section for some details of mobility and selfcare  Therapy/Group: Individual Therapy  Lenoir Facchini OTR/L 12/27/2019, 12:24 PM

## 2019-12-28 ENCOUNTER — Inpatient Hospital Stay (HOSPITAL_COMMUNITY): Payer: BC Managed Care – PPO | Admitting: Occupational Therapy

## 2019-12-28 ENCOUNTER — Inpatient Hospital Stay (HOSPITAL_COMMUNITY): Payer: BC Managed Care – PPO | Admitting: Speech Pathology

## 2019-12-28 ENCOUNTER — Inpatient Hospital Stay (HOSPITAL_COMMUNITY): Payer: BC Managed Care – PPO | Admitting: Physical Therapy

## 2019-12-28 MED ORDER — ALUM & MAG HYDROXIDE-SIMETH 200-200-20 MG/5ML PO SUSP
30.0000 mL | Freq: Four times a day (QID) | ORAL | Status: DC | PRN
  Administered 2019-12-28 – 2020-01-11 (×11): 30 mL via ORAL
  Filled 2019-12-28 (×13): qty 30

## 2019-12-28 MED ORDER — LISINOPRIL 5 MG PO TABS
5.0000 mg | ORAL_TABLET | Freq: Every day | ORAL | Status: DC
  Administered 2019-12-29 – 2020-01-12 (×14): 5 mg via ORAL
  Filled 2019-12-28 (×15): qty 1

## 2019-12-28 NOTE — Plan of Care (Signed)
  Problem: RH SAFETY Goal: RH STG ADHERE TO SAFETY PRECAUTIONS W/ASSISTANCE/DEVICE Description: STG Adhere to Safety Precautions With cues/reminders Assistance/Device. Outcome: Progressing   Problem: RH BOWEL ELIMINATION Goal: RH STG MANAGE BOWEL WITH ASSISTANCE Description: STG Manage Bowel with Assistance. Outcome: Progressing Goal: RH STG MANAGE BOWEL W/MEDICATION W/ASSISTANCE Description: STG Manage Bowel with Medication with Assistance. Outcome: Progressing

## 2019-12-28 NOTE — Progress Notes (Signed)
Occupational Therapy Session Note  Patient Details  Name: Dennis Mercado MRN: [id number] Date of Birth: April 14, 1981  Today's Date: 12/28/2019 OT Individual Time: 0901-1003 OT Individual Time Calculation (min): 62 min    Short Term Goals: Week 2:  OT Short Term Goal 1 (Week 2): Pt will complete UB dressing with min assist following hemi dressing techniques. OT Short Term Goal 2 (Week 2): Pt will donn pants and brief with mod assist following hemi techniques for two consecutive sessions. OT Short Term Goal 3 (Week 2): Pt will complete sit to stand with min assist during LB selfcare tasks. OT Short Term Goal 4 (Week 2): Pt will use the LUE as a stabilizer with max assist hand over hand during selfcare tasks.  Skilled Therapeutic Interventions/Progress Updates:    Session 1: ([id number]) Pt completed supine to sit with max assist to begin session.  He then worked on donning pants and shoes sit to stand.  Mod assist for dynamic sitting balance while working with LOB to the left noted at times.  He needs max assist for threading the LLE through garments and is not tolerating crossing it over the left knee to assist with this secondary to pain.  He demonstrates poor awareness of his deficits as well stating, "I'll just stand and put my foot in the shoe".  Therapist assisted with donning the left shoe with max assist and then he was able to donn the right.  Max assist was needed for threading both LEs through his pants with mod assist for standing to pull them over his hips.  He then completed transfer stand pivot to the wheelchair with mod assist and was then transferred down to the therapy gym where he was positioned in sitting on a therapy mat.  Place the LUE on dycem in weightbearing beside of him and focused on weight shifting to the left for lifting the RLE off of the floor in sitting.  Progressed to having him reach target on the left side with the RUE while maintaining weightbearing on the left hand.   Max facilitation for activation of the left elbow with max instructional cueing for sustained attention.  When told to keep his right hand on therapist's hand for extended reach, he would remove it after a few seconds secondary to his decreased attention.  Placed the LUE on tilted stool to finish neuromuscular re-education with emphasis on trying to push the stool forward.  Pt with only trace shoulder movement noted.  Finished session with return to the wheelchair and return to the room with call button and phone in reach and safety belt in place.    Session 2: ([id number])  Pt completed supine to sit EOB with max assist to start session.  He then worked on donning his zip up shoes with overall mod assist and mod instructional cueing for technique.  He still reports increased pain in the left hip when crossing it over the right knee to donn his shoe, so pain meds were requested and brought by nursing.  Had him work on activation of the internal rotators of the left arm in sitting to move his hand from therapist's leg to his leg.  He was able to demonstrate inconsistent trace shoulder movements with this task.  Also had him work on standing on weightshifting side to side while attempting to keep the left knee, hip, and trunk in extension, but to avoid over extension of the head.  Total +2 (pt 50%) for standing balance with weightshifts.  Finished session with stand pivot transfer to the wheelchair at [facility] assist level with pt maintaining sitting up in preparation for next therapy.  NMES was applied for 60 mins to the left shoulder for subluxation at level 8 resistance.  Parameters listed below with pt demonstrating no adverse reactions to the stimulation and no redness was noted.   [place]o Stim One 330 pulse width 35 Hz pulse rate On 8 sec/ off 8 sec Ramp up/ down 2 sec Symmetrical Biphasic wave form  Max intensity 156mA at 500 Ohm load   Therapy Documentation Precautions:  Precautions Precautions:  Fall Precaution Comments: L UE/LE severe paresis with impaired sensation; impaired safety awareness Restrictions Weight Bearing Restrictions: No  Pain: Pain Assessment Pain Scale: Faces Faces Pain Scale: Hurts a little bit Pain Location: Leg Pain Orientation: Left ADL: See Care Tool Section for some details   Therapy/Group: Individual Therapy  Tashai Catino OTR/L 12/28/2019, 12:20 PM

## 2019-12-28 NOTE — Progress Notes (Signed)
Physical Therapy Session Note  Patient Details  Name: Dennis Mercado MRN: [id number] Date of Birth: 10-25-1980  Today's Date: 12/28/2019 PT Individual Time: 1245-8099 PT Individual Time Calculation (min): 45 min   Short Term Goals: Week 2:  PT Short Term Goal 1 (Week 2): Pt will perform supine<>sit with mod assist PT Short Term Goal 2 (Week 2): Pt will perform sit<>stand with min assist PT Short Term Goal 3 (Week 2): Pt will perform squat pivot transfers with min assist  Skilled Therapeutic Interventions/Progress Updates:    Pt received sitting in TIS w/c and agreeable to therapy session. Pt continues to demo some impaired awareness stating he was in a mall earlier despite pt also being aware that he is at [place] to/from gym in w/c for time management and energy conservation. L stand pivot w/c>EOM (unable to get arm rest to flip back to perform squat pivot) with mod/max assist for balance, blocking L knee buckle, and stepping L LE. Sit>supine with min/mod assist for L hemibody management. Supine bridging x8 reps with therapist providing max/total assist/facilitation to keep L knee from falling in/out - attempted providing ball between knees to promote increased hip adduction but only minimal muscle activation noted with pt still requiring max assist for L LE alignment (pt had difficulty time conceptually understanding holding the ball between his knees) - therapist provided visual demonstration of task with pt having significant improvement in understanding resulting in increased hip extension with increased L LE glute muscle recruitment and activation x12 reps. Supine>sitting L EOM with mod assist for L hemibody management and trunk upright. L squat pivot to w/c mod assist for lifting/pivoting hips - cuing to hold therapist's arm with R UE to avoid excessive compensation with that R hand during transfers. Sitting in w/c>tall kneeling on mat with +2 mod/max assist for balance  and total assist for L LE management onto mat. In tall kneeling with L UE weightbearing on bench and mirror feedback worked on midline trunk alignment: - cuing for moving L hip anteriorly and activating L glute - avoiding excessive posterior or L lean (improving midline orientation) - progressed to attempting moving R knee in/out targeting L LE WBing and hip abductor activation but pt with minimal movement of R LE - progressed to repeated alternating hip/knee flexion/extension targeting glute activation with pt having excessive hip extension once up in tall kneeling position causing the bowing back posture - pt also becoming fatigued Tall kneeling>R sidelying on mat with mod assist (+2 assist to move bench). Returned to sitting EOM with mod/max assist for trunk upright. L squat pivot to w/c with mod assist for lifting/pivoting hips. Transported back to room and left seated tilted back in TIS w/c with needs in reach, seat belt alarm on, and telesitter in place.  Therapy Documentation Precautions:  Precautions Precautions: Fall Precaution Comments: L UE/LE severe paresis with impaired sensation; impaired safety awareness Restrictions Weight Bearing Restrictions: No  Pain: No complaints of pain during session.   Therapy/Group: Individual Therapy  Tawana Scale , PT, DPT, CSRS  12/28/2019, 12:32 PM

## 2019-12-28 NOTE — Progress Notes (Signed)
Pt requested thermostat to be turned up. While in room pt stated "I am sad, I use to be able to turn my thermostat up or down when I wanted to or use my phone when I wanted to and turn the TV channels." writer assured pt that it will get better that healing takes time. Pt phone could not be found in room, maybe wife took it home. Will pass to day shift to look for phone.

## 2019-12-28 NOTE — Progress Notes (Signed)
[place] PHYSICAL MEDICINE & REHABILITATION PROGRESS NOTE   Subjective/Complaints: Patient is in much better mood today. No longer has headache or chest pain. Has not eaten much breakfast. Having a hard time opening his juice and thinks it is frosted flakes.   ROS: Limited by cognition, positive for headaches, negative for chest pain, shortness of breath, vomiting  Objective:   No results found. Recent Labs    12/27/19 1831  WBC 10.4  HGB 12.3*  HCT 39.3  PLT 600*   Recent Labs    12/27/19 1831  NA 136  K 4.5  CL 101  CO2 21*  GLUCOSE 123*  BUN 46*  CREATININE 1.73*  CALCIUM 9.8    Intake/Output Summary (Last 24 hours) at 12/28/2019 1039 Last data filed at 12/28/2019 0700 Gross per 24 hour  Intake 240 ml  Output 925 ml  Net -685 ml     Physical Exam: Vital Signs Blood pressure (!) 114/92, pulse 87, temperature 98.3 F (36.8 C), resp. rate 16, height 5\' 7"  (1.702 m), weight 71 kg, SpO2 100 %. General: Alert and oriented x 3, No apparent distress HEENT: Head is normocephalic, atraumatic, PERRLA, EOMI, sclera anicteric, oral mucosa pink and moist, dentition intact, ext ear canals clear,  Neck: Supple without JVD or lymphadenopathy Heart: Reg rate and rhythm. No murmurs rubs or gallops Chest: CTA bilaterally without wheezes, rales, or rhonchi; no distress Abdomen: Soft, non-tender, non-distended, bowel sounds positive. Extremities: No clubbing, cyanosis, or edema. Pulses are 2+ Skin: Clean and intact without signs of breakdown Motor: LUE/LLE: 0/5 proximal distal, unchanged  Assessment/Plan: 1. 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  Physiatrist is providing close team supervision and 24 hour management of active medical problems listed below.  Physiatrist and rehab team continue to assess barriers to discharge/monitor patient progress toward  functional and medical goals  Care Tool:  Bathing    Body parts bathed by patient: Left arm, Chest, Abdomen, Front perineal area, Buttocks, Right upper leg, Left upper leg, Right lower leg, Left lower leg, Face   Body parts bathed by helper: Right arm     Bathing assist Assist Level: Moderate Assistance - Patient 50 - 74%     Upper Body Dressing/Undressing Upper body dressing Upper body dressing/undressing activity did not occur (including orthotics): N/A What is the patient wearing?: Pull over shirt    Upper body assist Assist Level: Maximal Assistance - Patient 25 - 49%    Lower Body Dressing/Undressing Lower body dressing      What is the patient wearing?: Pants, Incontinence brief     Lower body assist Assist for lower body dressing: Maximal Assistance - Patient 25 - 49%     Toileting Toileting    Toileting assist Assist for toileting: Set up assist Assistive Device Comment: urinal (assist placing urinal, pt will hold)   Transfers Chair/bed transfer  Transfers assist  Chair/bed transfer activity did not occur: N/A  Chair/bed transfer assist level: Moderate Assistance - Patient 50 - 74%     Locomotion Ambulation   Ambulation assist   Ambulation activity did not occur: Safety/medical concerns  Assist level: 2 helpers (+2 mod assist) Assistive device: Hand held assist Max distance: 25ft   Walk 10 feet activity   Assist  Walk 10 feet activity did not occur: Safety/medical concerns  Assist level: 2 helpers (+2 mod assist) Assistive device: Hand held assist   Walk  50 feet activity   Assist Walk 50 feet with 2 turns activity did not occur: Safety/medical concerns (hasn't been able to advance to turns)  Assist level: 2 helpers Assistive device: Lite Gait    Walk 150 feet activity   Assist Walk 150 feet activity did not occur: Safety/medical concerns         Walk 10 feet on uneven surface  activity   Assist Walk 10 feet on uneven  surfaces activity did not occur: Safety/medical concerns         Wheelchair     Assist Will patient use wheelchair at discharge?: No (No PT lomg term goals set)             Wheelchair 50 feet with 2 turns activity    Assist            Wheelchair 150 feet activity     Assist          Blood pressure (!) 114/92, pulse 87, temperature 98.3 F (36.8 C), resp. rate 16, height 5\' 7"  (1.702 m), weight 71 kg, SpO2 100 %.  Medical Problem List and Plan: 1.  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  Antithrombotics: -DVT/anticoagulation: Subcutaneous heparin initiated 12/11/2019,dopplers neg              -antiplatelet therapy: N/A 3. Pain Management: Tylenol as needed,    Topirimate 25mg  BID  Without headache this morning.  4. Mood: Provide emotional support             -antipsychotic agents: N/A 5. Neuropsych: This patient is not fully capable of making complex decisions on his own behalf. 6. Skin/Wound Care: Routine skin checks 7. Fluids/Electrolytes/Nutrition: Routine in and outs. 8.  Post stroke dysphagia.  Dysphagia #3 thins liquids.  Follow-up speech therapy.  Advance diet as tolerated 9.  Left lung pneumonia.  Intravenous Unasyn completed 10.  Hypertension.  Norvasc 10 mg daily, Coreg 25 mg twice daily, clonidine 0.3 mg twice daily  Hydralazine 100 mg every 8 hours, d/ced on 7/10  Lisinopril 10 mg twice daily, decreased to 5 twice daily on 7/11.   Vitals:   12/27/19 1926 12/28/19 0545  BP: 107/82 (!) 114/92  Pulse: 90 87  Resp: 20 16  Temp: 98.8 F (37.1 C) 98.3 F (36.8 C)  SpO2: 99% 100%   7/13: BP is low. Decrease Lisinopril to 5mg  daily.              Monitor with increased mobility 11.  Urine drug screen positive marijuana.  Counseled on appropriate  12.  Pre renal azotemia worsening  will enc fluid, given IVF x1 bolus  Creatinine 1.61 on 7/6,  up to 1.73 on 7/12. Encourage hydration, decrease Lisinopril dose, 7/14.  13.  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  This will likely occur more often at night.  Monitor for signs of alcohol withdrawal although drinking pattern was mainly on the weekends. 14.  Sleep disturbance   Trial Seroquel increased to 50 mg nightly 15.  Leukocytosis   WBCs 12.2 on 7/6, 10.4 on 7/12 16.  Transaminitis: Resolved 17.  Hyponatremia  Sodium 134 on 7/6, 136 on 7/12. 18.  Acute blood loss anemia  Hemoglobin 12.6 on 7/6, 12.3 on 7/12  LOS: 13 days A FACE TO FACE EVALUATION WAS PERFORMED  Martha Clan P Saylor Murry 12/28/2019, 10:39 AM

## 2019-12-28 NOTE — Progress Notes (Signed)
Speech Language Pathology Daily Session Note  Patient Details  Name: Dennis Mercado MRN: [id number] Date of Birth: Jul 22, 1980  Today's Date: 12/28/2019 SLP Individual Time: 1100-1155 SLP Individual Time Calculation (min): 55 min  Short Term Goals: Week 2: SLP Short Term Goal 1 (Week 2): Pt will consume current diet with efficient mastication and oral clearance and min overt s/sx aspiration with Supervision A verbal/visual cues for use of compensatory swallow strategies to minimize anterior loss and oral clearance. SLP Short Term Goal 2 (Week 2): Pt will demonstrate ability to problem solve basic situations with Mod A verbal/visual cues. SLP Short Term Goal 3 (Week 2): Pt will sustain attention to functional tasks for 3-5 minute intervals with Mod A verbal/visual cues. SLP Short Term Goal 4 (Week 2): Pt will recall new and/or daily information with Mod A cues for use of strategies/aids. SLP Short Term Goal 5 (Week 2): Pt will detect functional errors with Max A multimodal cues.  Skilled Therapeutic Interventions: Pt was seen for skilled ST targeting cognitive goals. SLP facilitated session with several basic cognitive tasks targeting sustained attention, emergent awareness, and problem solving goals. Pt required overall Mod A for error awareness and problem solving, Max A for attention in 2-4 minute intervals. Pt required Mod A verbal and visual cues for functional problem sovling use of his TV remote functions. During a reading task (channel list), pt required Max A to locate 3 preferred channels on the list and then Min A to find them on the TV with use of his remote. Pt left sitting in tilt in space chair with alarm set, telesitter active, needs met. Continue per current plan of care.        Pain Pain Assessment Pain Scale: Faces Faces Pain Scale: No hurt   Therapy/Group: Individual Therapy  Arbutus Leas 12/28/2019, 7:25 AM

## 2019-12-29 ENCOUNTER — Inpatient Hospital Stay (HOSPITAL_COMMUNITY): Payer: BC Managed Care – PPO | Admitting: Speech Pathology

## 2019-12-29 ENCOUNTER — Inpatient Hospital Stay (HOSPITAL_COMMUNITY): Payer: BC Managed Care – PPO | Admitting: Occupational Therapy

## 2019-12-29 ENCOUNTER — Inpatient Hospital Stay (HOSPITAL_COMMUNITY): Payer: BC Managed Care – PPO | Admitting: Physical Therapy

## 2019-12-29 LAB — BASIC METABOLIC PANEL
Anion gap: 14 (ref 5–15)
BUN: 40 mg/dL — ABNORMAL HIGH (ref 6–20)
CO2: 23 mmol/L (ref 22–32)
Calcium: 9.9 mg/dL (ref 8.9–10.3)
Chloride: 101 mmol/L (ref 98–111)
Creatinine, Ser: 1.62 mg/dL — ABNORMAL HIGH (ref 0.61–1.24)
GFR calc Af Amer: >60 mL/min (ref >60)
GFR calc non Af Amer: 53 mL/min — ABNORMAL LOW (ref >60)
Glucose, Bld: 96 mg/dL (ref 70–99)
Potassium: 4.2 mmol/L (ref 3.5–5.1)
Sodium: 138 mmol/L (ref 135–145)

## 2019-12-29 NOTE — Patient Care Conference (Signed)
Inpatient RehabilitationTeam Conference and Plan of Care Update Date: 12/29/2019   Time: 2:17 PM    Patient Name: Dennis Mercado      Medical Record Number: [id number]  Date of Birth: [birth year] Sex: Male         Room/Bed: 4W06C/4W06C-01 Payor Info: Payor: [place] / Plan: [employer] COMM PPO / Product Type: *No Product type* /    Admit Date/Time:  12/15/2019  4:59 PM  Primary Diagnosis:  ICH (intracerebral hemorrhage) [facility])  Hospital Problems: Principal Problem:   ICH (intracerebral hemorrhage) (HCC) Active Problems:   Prerenal azotemia   Sleep disturbance   Leukocytosis   Transaminitis   Hyponatremia   Acute blood loss anemia   Hypotension due to drugs   Vascular headache    Expected Discharge Date: Expected Discharge Date: 01/12/20  Team Members Present: Physician leading conference: Dr. Alysia Penna Care Coodinator Present: Dorien Chihuahua, RN, BSN, CRRN;Christina [place], [place] Nurse Present: Debroah Loop, RN PT Present: Page Spiro, PT OT Present: Clyda Greener, OT SLP Present: Jettie Booze, CF-SLP PPS Coordinator present : Gunnar Fusi, SLP     Current Status/Progress Goal Weekly Team Focus  Bowel/Bladder   patient is incontinent/continent with bowel and bladder. LBM 12/28/19  Regain full continence of B&B.  Provide toileting q2-3 hours or PRN to decrease incotinence   Swallow/Nutrition/ Hydration   Dysphagia 3 textures, thin liquids, Min A  Supervision  independence with use of strategies for oral clearance and anterior loss, trials advanced solids   ADL's   Min assist for UB bathing with max assist still for UB dressing.  LB bathing at mod assist with LB dressing at overall max assist.  Mod assist for toilet transfers with mod assist for toileting tasks as well.  Still Brunnstrum stage I-II in the left arm with slight trace shoulder movement noted in the right conditions.  Still with confusion and decreased sustained attention overall.  min assist   selfcare retraining, balance retraining, transfer training, neuromuscular re-education, NMES, splinting, DME education, therapeutic exercise   Mobility   mod-max assist bed mobility. mod-max assist stand pivot and squat pivot transfers. total A +2 gait due to poor awareness, postural control and L sided inattention  Min Assist overall  Forced use of the LUE/LLE. safety in transfers, awareness of the L side, sitting balance, midline orientation, pt and family education   Communication             Safety/Cognition/ Behavioral Observations  Minimal progress this week, increased verbal agitation at times, Mod-Max  Supervision (likely need to downgrade level and complexity)  functional problem solving, attention to tasks, visual scanning, emergent awareness   Pain   patient complain of generalize pain during movement and activity  pain is <3 with or without activity  Q shift and PRN pain assessment   Skin   Patient has no skin breakdown  Maintain good skin integrity.  Assess skin q shift and PRN     Team Discussion:  Discharge Planning/Teaching Needs:  Goal is for patient to discharge home  Will schedule with spouse and mother   Current Update: on target  Current Barriers to Discharge:  Decreased caregiver support, Behavior and Limited progress since admission  Possible Resolutions to Barriers: Family Education with caregivers to review assistance needed for discharge   Patient on target to meet rehab goals: no, goals downgraded for SLP to more basic cognition goals. Currently at [facility] assist with MIN assist goals and little progression over the past week  *  See Care Plan and progress notes for long and short-term goals.   Revisions to Treatment Plan:  Evaluate need for specialty DME to decrease level of assistance required  E-stimulation for shoulder subluxation Redirect for functional tasks when patient is confabulating or note language of confusion Encourage demonstrations  for actions requested to improve comprehension due to perceptual understanding issues    Medical Summary Current Status: poor sleep, now cont of bowel and bladder Weekly Focus/Goal: intermittent tone issues  Barriers to Discharge: Medical stability;Medication compliance  Barriers to Discharge Comments: occ refusing meds Possible Resolutions to Barriers: cont current rehab program, trial of neuromuscular e stim   Continued Need for Acute Rehabilitation Level of Care: The patient requires daily medical management by a physician with specialized training in physical medicine and rehabilitation for the following reasons: Direction of a multidisciplinary physical rehabilitation program to maximize functional independence : Yes Medical management of patient stability for increased activity during participation in an intensive rehabilitation regime.: Yes Analysis of laboratory values and/or radiology reports with any subsequent need for medication adjustment and/or medical intervention. : Yes   I attest that I was present, lead the team conference, and concur with the assessment and plan of the team.   Dorien Chihuahua B 12/29/2019, 2:17 PM

## 2019-12-29 NOTE — Progress Notes (Addendum)
Occupational Therapy Weekly Progress Note  Patient Details  Name: Dennis Mercado MRN: [id number] Date of Birth: 1981-04-09  Beginning of progress report period: December 23, 2019 End of progress report period: December 29, 2019  Today's Date: 12/29/2019 OT Individual Time: 3235-5732 OT Individual Time Calculation (min): 61 min    Patient has met 1 of 4 short term goals.  Mr. Gloster is making slower progress with OT at this time than originally expected.  Currently he is able to complete UB bathing with min assist but still needs mod assist for LB sit to stand.  All dressing tasks are at max assist level currently as he demonstrates difficulty with understanding and carryover of hemi dressing techniques.  In addition, when attempting to cross the LLE over the right knee, he needs max assist and also reports increased pain in the left leg and cannot tolerate it long enough to complete dressing tasks.  LUE and LLE hemiparesis is still present, with pt demonstrating Brunnstrum stage I-II in the left arm with max assist needed for hand over hand use as a stabilizer during bathing and grooming tasks.  He continues to demonstrate some left inattention as well, not noticing items place on his left without cueing at times.  His sitting balance has improved to a supervision level for static sitting with mod assist overall for dynamic sitting.  He completes sit to stand with mod assist during LB selfcare as well as completion of stand pivot transfers at the same level.  Sustained attention continues to be a limiting factor with all tasks, as he needs max cueing to sustained attention during shower and for dressing tasks.  He continues to be easily internally distracted at times in both quiet and stimulating environments such as the gym.  Mr. Arguijo also continues with confusion and decreased memory day to day including orientation at times.  Feel he will continue to benefit from skilled CIR level OT with min assist goals  still established.  If progress remains slow over the next week, they will need to be downgraded.      Patient continues to demonstrate the following deficits: muscle weakness, impaired timing and sequencing, abnormal tone, unbalanced muscle activation, motor apraxia, decreased coordination and decreased motor planning, decreased attention to left and decreased motor planning, decreased initiation, decreased attention, decreased awareness, decreased problem solving, decreased safety awareness, decreased memory and delayed processing and decreased sitting balance, decreased standing balance, decreased postural control, hemiplegia and decreased balance strategies and therefore will continue to benefit from skilled OT intervention to enhance overall performance with BADL.  Patient progressing toward long term goals..  Continue plan of care.  OT Short Term Goals Week 3:  OT Short Term Goal 1 (Week 3): Pt will complete UB dressing with min assist following hemi dressing techniques. OT Short Term Goal 2 (Week 3): Pt will donn pants and brief with mod assist following hemi techniques for two consecutive sessions. OT Short Term Goal 3 (Week 3): Pt will complete sit to stand with min assist during LB selfcare tasks. OT Short Term Goal 4 (Week 3): Pt will complete toilet transfer with min assist to the 3:1 squat or stand pivot OT Short Term Goal 5 (Week 3): Pt will sequence through bathing tasks with no more than mod instructional cueing.  Skilled Therapeutic Interventions/Progress Updates:    Pt completed shower and dressing during session. Max assist for rolling to the right side of the bed with mod assist for transition to sitting from sidelying.  He continues to need max instructional cueing to sequence bed mobility at this time.  He completed stand pivot transfers to the wheelchair as well as to the shower bench at mod assist level with max assist for removal of clothing overall.  He needed max hand over  hand for integration of the LUE as a stabilizer with bathing tasks as well as max instructional cueing for sequencing applying soap to the washcloth and for sequencing body parts to be washed.  Once complete, he transferred back to the wheelchair for work on dressing at the sink.  He needed max assist with max demonstrational cueing for donning his pullover shirt, brief, and pants.  Still with increased pain in the left hip when attempting to cross the LLE over the right knee, requiring increased time and max assist to maintain.  Therapist assisted with donning TEDs and pt donned his right shoe with setup and cueing to not try to stand and put his foot in it.  Max assist was needed to donn the left shoe.  Finished session with pt in the wheelchair with NMES applied to the left shoulder for intervention to address subluxation.  Pt was able to tolerate 60 mins of stimulation without any adverse reactions or pain.  No redness noted with removal of pads.  See below for parameters with intensity set on level 8.   [place]o Stim One 330 pulse width 35 Hz pulse rate On 8 sec/ off 8 sec Ramp up/ down 2 sec Symmetrical Biphasic wave form  Max intensity 159m at 500 Ohm load   Therapy Documentation Precautions:  Precautions Precautions: Fall Precaution Comments: L UE/LE severe paresis with impaired sensation; impaired safety awareness Restrictions Weight Bearing Restrictions: No  Pain: Pain Assessment Pain Scale: 0-10 Pain Score: 0-No pain ADL: See Care Tool Section for some details of mobility and selfcare  Therapy/Group: Individual Therapy  Darreon Lutes OTR/L 12/29/2019, 3:49 PM

## 2019-12-29 NOTE — Progress Notes (Signed)
[place] PHYSICAL MEDICINE & REHABILITATION PROGRESS NOTE   Subjective/Complaints:  No issues overnite, occ spasms noted . Pt states that bowels are mopving well. Discussed tone woth PT  ROS: Limited by cognition, neg breathing issues no CP Objective:   No results found. Recent Labs    12/27/19 1831  WBC 10.4  HGB 12.3*  HCT 39.3  PLT 600*   Recent Labs    12/27/19 1831  NA 136  K 4.5  CL 101  CO2 21*  GLUCOSE 123*  BUN 46*  CREATININE 1.73*  CALCIUM 9.8    Intake/Output Summary (Last 24 hours) at 12/29/2019 0811 Last data filed at 12/28/2019 2136 Gross per 24 hour  Intake --  Output 125 ml  Net -125 ml     Physical Exam: Vital Signs Blood pressure 113/82, pulse 82, temperature 98.2 F (36.8 C), resp. rate 18, height '5\' 7"'$  (1.702 m), weight 71 kg, SpO2 100 %.  General: No acute distress Mood and affect are appropriate Heart: Regular rate and rhythm no rubs murmurs or extra sounds Lungs: Clear to auscultation, breathing unlabored, no rales or wheezes Abdomen: Positive bowel sounds, soft nontender to palpation, nondistended Extremities: No clubbing, cyanosis, or edema Skin: No evidence of breakdown, no evidence of rash Neurologic: Cranial nerves II through XII intact, motor strength is 5/5 in Right and 0/5 left deltoid, bicep, tricep, grip, hip flexor, knee extensors, ankle dorsiflexor and plantar flexor Sensory exam normal sensation to light touch and proprioception in bilateral upper and lower extremities Cerebellar exam normal finger to nose to finger as well as heel to shin in bilateral upper and lower extremities Musculoskeletal: Full range of motion in all 4 extremities. No joint swelling  Assessment/Plan: 1. 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  Physiatrist is providing close team supervision and 24 hour management of active medical  problems listed below.  Physiatrist and rehab team continue to assess barriers to discharge/monitor patient progress toward functional and medical goals  Care Tool:  Bathing    Body parts bathed by patient: Left arm, Chest, Abdomen, Front perineal area, Buttocks, Right upper leg, Left upper leg, Right lower leg, Left lower leg, Face   Body parts bathed by helper: Right arm     Bathing assist Assist Level: Moderate Assistance - Patient 50 - 74%     Upper Body Dressing/Undressing Upper body dressing Upper body dressing/undressing activity did not occur (including orthotics): N/A What is the patient wearing?: Pull over shirt    Upper body assist Assist Level: Maximal Assistance - Patient 25 - 49%    Lower Body Dressing/Undressing Lower body dressing      What is the patient wearing?: Pants     Lower body assist Assist for lower body dressing: Maximal Assistance - Patient 25 - 49%     Toileting Toileting    Toileting assist Assist for toileting: Set up assist Assistive Device Comment: urinal (assist placing urinal, pt will hold)   Transfers Chair/bed transfer  Transfers assist  Chair/bed transfer activity did not occur: N/A  Chair/bed transfer assist level: Moderate Assistance - Patient 50 - 74% (squat pivot)     Locomotion Ambulation   Ambulation assist   Ambulation activity did not occur: Safety/medical concerns  Assist level: 2 helpers (+2 mod assist) Assistive device: Hand held assist Max distance: 1f   Walk 10 feet activity   Assist  Walk 10 feet  activity did not occur: Safety/medical concerns  Assist level: 2 helpers (+2 mod assist) Assistive device: Hand held assist   Walk 50 feet activity   Assist Walk 50 feet with 2 turns activity did not occur: Safety/medical concerns (hasn't been able to advance to turns)  Assist level: 2 helpers Assistive device: Lite Gait    Walk 150 feet activity   Assist Walk 150 feet activity did not occur:  Safety/medical concerns         Walk 10 feet on uneven surface  activity   Assist Walk 10 feet on uneven surfaces activity did not occur: Safety/medical concerns         Wheelchair     Assist Will patient use wheelchair at discharge?: No (No PT lomg term goals set)             Wheelchair 50 feet with 2 turns activity    Assist            Wheelchair 150 feet activity     Assist          Blood pressure 113/82, pulse 82, temperature 98.2 F (36.8 C), resp. rate 18, height '5\' 7"'$  (1.702 m), weight 71 kg, SpO2 100 %.  Medical Problem List and Plan: 1.  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 Formulized individual treatment plan based on medical history, underlying problem and comorbidities.   PRAFO/WHO qhs 2.  Antithrombotics: -DVT/anticoagulation: Subcutaneous heparin initiated 12/11/2019,dopplers neg              -antiplatelet therapy: N/A 3. Pain Management: Tylenol as needed,    Topirimate '25mg'$  BID  Without headache this morning.  4. Mood: Provide emotional support             -antipsychotic agents: N/A 5. Neuropsych: This patient is not fully capable of making complex decisions on his own behalf. 6. Skin/Wound Care: Routine skin checks 7. Fluids/Electrolytes/Nutrition: Routine in and outs. 8.  Post stroke dysphagia.  Dysphagia #3 thins liquids.  Follow-up speech therapy.  Advance diet as tolerated 9.  Left lung pneumonia.  Intravenous Unasyn completed 10.  Hypertension.  Norvasc 10 mg daily, Coreg 25 mg twice daily, clonidine 0.3 mg twice daily  Hydralazine 100 mg every 8 hours, d/ced on 7/10  Lisinopril 10 mg twice daily, decreased to 5 twice daily on 7/11.   Vitals:   12/28/19 1952 12/29/19 0632  BP: 118/79  113/82  Pulse: 85 82  Resp: 18 18  Temp: 99.1 F (37.3 C) 98.2 F (36.8 C)  SpO2: 100% 100%   7/14: BP is low. Decrease Lisinopril to '5mg'$  daily.              Monitor with increased mobility 11.  Urine drug screen positive marijuana.  Counseled on appropriate  12.  Pre renal azotemia worsening  will enc fluid, given IVF x1 bolus  Creatinine 1.61 on 7/6, up to 1.73 on 7/12. Encourage hydration, decrease Lisinopril dose, 7/14.  13.  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  This will likely occur more often at night.  Monitor for signs of alcohol withdrawal although drinking pattern was mainly on the weekends. 14.  Sleep disturbance   Trial Seroquel increased to 50 mg nightly 15.  Leukocytosis   WBCs 12.2 on 7/6, 10.4 on  7/12 16.  Transaminitis: Resolved 17.  Hyponatremia- resolved   Sodium 134 on 7/6, 136 on 7/12. 18.  normochomic normocytic anemia, borderline, stable   Hemoglobin 12.6 on 7/6, 12.3 on 7/12  LOS: 14 days A FACE TO [place] E Charlisa Cham 12/29/2019, 8:11 AM

## 2019-12-29 NOTE — Progress Notes (Signed)
Speech Language Pathology Daily Session Note  Patient Details  Name: Master Touchet MRN: [id number] Date of Birth: [birth year]  Today's Date: 12/29/2019 SLP Individual Time: 3013-1438 SLP Individual Time Calculation (min): 42 min  Short Term Goals: Week 2: SLP Short Term Goal 1 (Week 2): Pt will consume current diet with efficient mastication and oral clearance and min overt s/sx aspiration with Supervision A verbal/visual cues for use of compensatory swallow strategies to minimize anterior loss and oral clearance. SLP Short Term Goal 2 (Week 2): Pt will demonstrate ability to problem solve basic situations with Mod A verbal/visual cues. SLP Short Term Goal 3 (Week 2): Pt will sustain attention to functional tasks for 3-5 minute intervals with Mod A verbal/visual cues. SLP Short Term Goal 4 (Week 2): Pt will recall new and/or daily information with Mod A cues for use of strategies/aids. SLP Short Term Goal 5 (Week 2): Pt will detect functional errors with Max A multimodal cues.  Skilled Therapeutic Interventions: Pt was seen for skilled ST targeting cognitive goals. Pt demonstrtaed increased insight into his current cognitive function today and asked appropriate questions regarding why he was having difficulty problem solving and attending during structured tasks. Education provided regarding stroke and acute deficits, as well as encouragement to participate and attend in therapies to progress. Pt required Max A multimodal cues for problem solving, Mod A for visual scanning during a clock drawing task, but only Min a for planning. He required fluctuating Mod-Max A for problem solving and emergent awareness when recreating basic Musician. Pt left sitting in bed with alarm set and needs within reach, telesitter activated. Continue per current plan of care.        Pain Pain Assessment Pain Scale: 0-10 Pain Score: 0-No pain  Therapy/Group: Individual Therapy  Arbutus Leas 12/29/2019, 7:16 AM

## 2019-12-29 NOTE — Plan of Care (Signed)
°  Problem: RH SAFETY Goal: RH STG ADHERE TO SAFETY PRECAUTIONS W/ASSISTANCE/DEVICE Description: STG Adhere to Safety Precautions With cues/reminders Assistance/Device. Outcome: Progressing   Problem: RH SAFETY Goal: RH STG ADHERE TO SAFETY PRECAUTIONS W/ASSISTANCE/DEVICE Description: STG Adhere to Safety Precautions With Assistance/Device. Outcome: Progressing Goal: RH STG DECREASED RISK OF FALL WITH ASSISTANCE Description: STG Decreased Risk of Fall With Assistance. Outcome: Progressing

## 2019-12-29 NOTE — Progress Notes (Signed)
Physical Therapy Session Note  Patient Details  Name: Kanen Mottola MRN: [id number] Date of Birth: 10-Jan-1981  Today's Date: 12/29/2019 PT Individual Time: 0805-0900 PT Individual Time Calculation (min): 55 min   Short Term Goals: Week 2:  PT Short Term Goal 1 (Week 2): Pt will perform supine<>sit with mod assist PT Short Term Goal 2 (Week 2): Pt will perform sit<>stand with min assist PT Short Term Goal 3 (Week 2): Pt will perform squat pivot transfers with min assist   Skilled Therapeutic Interventions/Progress Updates:   Pt received supine in bed and agreeable to PT. PT assisted pt in eating task in bed with max cues for attention to task and proper swallow strategy. Lower body dressing with total A at bed level to don pants over feet. Supine>sit transfer with moderate assist to facilitate trunk and LLE positioning. Sit<>stans at EOB with max assist to block the LLE while PT pulled pants to waist. Stand pivot transfer with max assist to block the L knee and prevent buckling. Once in sitting, pt requested to continue to eat breakfast. Sitting sustained attention task to eat breakfast with moderate cues for decreased attention to task intermittently. Pt completed 25-30% of meal. Pt transported to rehab gym. Squat pivot transfer to and from mat table with mod assist to block the L knee. Sit<>stand with givmohr in place and UE support on hemiwalker. Max assist to maintain standing balance and perform lateral weight shifting R and L using visual feedback from mirror; max cues to maintain shoulder width stance and force WB through the LLE. Pt returned to room requesting to return to bed. Pt stating that it was almost time to go to bed, and had to be re-oriented to time of day for 9:00AM vs PM. Performed stand pivot transfer to bed with Mod assist as listed above. Sit>supine completed with max assist due to poor safety awareness and processing, and left supine in bed with call bell in reach and all needs  met.        Therapy Documentation Precautions:  Precautions Precautions: Fall Precaution Comments: L UE/LE severe paresis with impaired sensation; impaired safety awareness Restrictions Weight Bearing Restrictions: No    Vital Signs: Therapy Vitals Temp: 98.2 F (36.8 C) Pulse Rate: 82 Resp: 18 BP: 113/82 Patient Position (if appropriate): Lying Oxygen Therapy SpO2: 100 % O2 Device: Room Air Pain: Pain Assessment Pain Scale: 0-10 Pain Score: 5  Faces Pain Scale: Hurts a little bit Pain Type: Acute pain Pain Location: Back Pain Orientation: Lower;Mid Pain Descriptors / Indicators: Aching;Discomfort Pain Frequency: Intermittent Pain Onset: On-going Patients Stated Pain Goal: 0 Pain Intervention(s): Medication (See eMAR) Multiple Pain Sites: No    Therapy/Group: Individual Therapy  Lorie Phenix 12/29/2019, 9:29 AM

## 2019-12-29 NOTE — Progress Notes (Signed)
Patient has some confusion when woken last night for PM medication. Patient refused to take medication at first stating "that is not my pills, I didn't agree to take that". This nurse redirect patient by informing about the time, place, and situation. Also nurse reinforce education to patient about the purpose of each medication, after some time patient agreed to take all PM medications. Patient rested well all night, no other concerns voiced.

## 2019-12-29 NOTE — Progress Notes (Signed)
Patient ID: Dennis Mercado, male   DOB: [birth year], 39 y.o.   MRN: [id number] Team Conference Report to Patient/Family  Team Conference discussion was reviewed with the patient and caregiver, including goals, any changes in plan of care and target discharge date.  Patient and caregiver express understanding and are in agreement.  The patient has a target discharge date of 01/12/20.  Dyanne Iha 12/29/2019, 2:05 PM

## 2019-12-30 ENCOUNTER — Inpatient Hospital Stay (HOSPITAL_COMMUNITY): Payer: BC Managed Care – PPO | Admitting: Physical Therapy

## 2019-12-30 ENCOUNTER — Inpatient Hospital Stay (HOSPITAL_COMMUNITY): Payer: BC Managed Care – PPO | Admitting: Occupational Therapy

## 2019-12-30 ENCOUNTER — Inpatient Hospital Stay (HOSPITAL_COMMUNITY): Payer: BC Managed Care – PPO | Admitting: Speech Pathology

## 2019-12-30 ENCOUNTER — Inpatient Hospital Stay (HOSPITAL_COMMUNITY): Payer: BC Managed Care – PPO

## 2019-12-30 NOTE — Progress Notes (Signed)
Physical Therapy Weekly Progress Note  Patient Details  Name: Dennis Mercado MRN: [id number] Date of Birth: 03/07/1981  Beginning of progress report period: December 23, 2019 End of progress report period: December 30, 2019  Today's Date: 12/30/2019 PT Individual Time: 1606-1708 PT Individual Time Calculation (min): 62 min   Patient has met 1 of 3 short term goals.  Dennis Mercado is continuing to progress slower than anticipated with therapy due to significant cognitive and perceptual impairments including L inattention, visual perceptual and visual motor impairments as well as impaired motor planning and conceptual awareness of body in space during mobility tasks. He is performing supine<>sit with mod assist, sit<>stands with min assist for lifting and mod assist for balance once in standing due to impaired trunk control and midline awareness, squat pivot transfers with lighter mod assist, as well as ambulating up to 87ft with +2 mod assist requiring max/total assist for L LE stance control and total assist for LE advancement during swing. He continues to require skilled physical therapy at CIR level to maximize progress and increase independence and decrease BOC.   Patient continues to demonstrate the following deficits muscle weakness, muscle joint tightness and muscle paralysis, decreased cardiorespiratoy endurance, impaired timing and sequencing, abnormal tone, unbalanced muscle activation, motor apraxia, decreased coordination and decreased motor planning, decreased visual perceptual skills and decreased visual motor skills, decreased midline orientation and decreased attention to left, decreased initiation, decreased attention, decreased awareness, decreased problem solving, decreased safety awareness, decreased memory and delayed processing and decreased sitting balance, decreased standing balance, decreased postural control and decreased balance strategies and therefore will continue to benefit from  skilled PT intervention to increase functional independence with mobility.  Patient progressing toward long term goals. Continue plan of care.  PT Short Term Goals Week 2:  PT Short Term Goal 1 (Week 2): Pt will perform supine<>sit with mod assist PT Short Term Goal 1 - Progress (Week 2): Met PT Short Term Goal 2 (Week 2): Pt will perform sit<>stand with min assist PT Short Term Goal 2 - Progress (Week 2): Progressing toward goal PT Short Term Goal 3 (Week 2): Pt will perform squat pivot transfers with min assist PT Short Term Goal 3 - Progress (Week 2): Progressing toward goal Week 3:  PT Short Term Goal 1 (Week 3): Pt will perform supine<>sit with min assist PT Short Term Goal 2 (Week 3): Pt will perform sit<>stand with min assist PT Short Term Goal 3 (Week 3): Pt will perform bed<>chair transfers with min assist  Skilled Therapeutic Interventions/Progress Updates:  Ambulation/gait training;Community reintegration;DME/adaptive equipment instruction;Neuromuscular re-education;Psychosocial support;Stair training;UE/LE Strength taining/ROM;Wheelchair propulsion/positioning;Balance/vestibular training;Discharge planning;Functional electrical stimulation;Pain management;Skin care/wound management;UE/LE Coordination activities;Therapeutic Activities;Cognitive remediation/compensation;Disease management/prevention;Functional mobility training;Patient/family education;Splinting/orthotics;Therapeutic Exercise;Visual/perceptual remediation/compensation   Pt received supine in bed, asleep but upon awakening agreeable to therapy session. Supine>sitting R EOB with mod assist for L hemibody management and trunk upright - pt demos improving initiation and activation during this task. Sitting EOB with CGA for trunk control while donning shoes max assist for time management. L squat pivot to w/c mod assist for pivoting hips - focused on this form of transfer during session to promote L attention, increased L LE  weightbearing and muscle activation, and to decrease R UE compensation. Transported to/from gym in w/c for time management and energy conservation. L squat pivot w/c>EOM mod assist. Sit<>stands to/from EOM with min assist for lifting into standing (cuing to decrease R UE compensation and promote L weight shift) but mod assist once in standing   for balance due to L lean. Standing balance with mirror feedback focusing on midline orientation and L LE hip/knee extensor muscles activation (unable to activate on command) - mod/max assist of 1 for balance/blocking L knee and +2 for safety. Progressed to L LE stance phase NMR via R LE foot taps on 6" step - max assist of 1 for balance and blocking L knee buckle, +2 present for safety and CGA/min assist if needed - mirror feedback and max multimodal cuing for L LE extension (avoiding hyperextension), L weight shift, and trunk upright, prior to lifting R LE - continues to demonstrate impaired trunk control and impaired motor planning to find midline (either has full R weight shift or excessive trunk/hip extension bowing trunk backwards, or LOB anterior to L). Sit>supine with mod assist for L hemibody management. Supine bridging x12 reps - requires visual demonstration to recall proper form/technique - demos increase L hip muscle activation with pt able to sustain L knee midline at rest and only requires min assist for knee alignment during the hip extension movement. Reps limited due to low back pain with it possibly radiating into L LE - details below. Supine>sitting L EOB mod assist. Squat pivot min/mod assist. Sitting in w/c>tall kneeling on max with +2 max assist for balance and total assist for L LE management onto mat. In tall kneeling, (L UE WBing on bench) with mirror feedback focused on midline orientation as pt has R weight shift - requires min to mod assist of 1 for balance. Tall kneeling>R sidelying on mat with mod assist. Squat pivot to w/c mod assist. Transported  back to room  - pt's wife present - left tilted back in TIS wheelchair with needs in reach, seat belt alarm on, and L UE supported on pillow.  Therapy Documentation Precautions:  Precautions Precautions: Fall Precaution Comments: L UE/LE severe paresis with impaired sensation; impaired safety awareness Restrictions Weight Bearing Restrictions: No  Pain: When performing bridging exercise reports he has low back pain and L LE adductor muscle pain - pt states the pain is worse with hip/knee flexion (bringing knee to chest) and when trying to put his shoes on during OT sessions - provided modification of interventions for pain management.  During tall kneeling reports when trying to recover his balance he has an electric type pain that radiates from his cervical spine down to his L shoulder and pectoralis region - modified intervention for pain management.   Therapy/Group: Individual Therapy  Tawana Scale , PT, DPT, CSRS  12/30/2019, 12:46 PM

## 2019-12-30 NOTE — Progress Notes (Signed)
Occupational Therapy Session Note  Patient Details  Name: Dennis Mercado MRN: [id number] Date of Birth: [birth year]  Today's Date: 12/30/2019 OT Individual Time: 6546-5035 OT Individual Time Calculation (min): 64 min    Short Term Goals: Week 3:  OT Short Term Goal 1 (Week 3): Pt will complete UB dressing with min assist following hemi dressing techniques. OT Short Term Goal 2 (Week 3): Pt will donn pants and brief with mod assist following hemi techniques for two consecutive sessions. OT Short Term Goal 3 (Week 3): Pt will complete sit to stand with min assist during LB selfcare tasks. OT Short Term Goal 4 (Week 3): Pt will complete toilet transfer with min assist to the 3:1 squat or stand pivot OT Short Term Goal 5 (Week 3): Pt will sequence through bathing tasks with no more than mod instructional cueing.  Skilled Therapeutic Interventions/Progress Updates:    Pt completed transfer to the EOB with mod to max assist.  Once sitting, had him work on donning his shoes for transfer to the wheelchair.  He needed mod assist to complete with mod assist for transfer over to the wheelchair.  Therapist took him down to the ortho gym for work on [employer].  He completed several intervals sitting on the EOC using 4 rings of the [employer].  The first interval he demonstrated an average time of 6.67 seconds between lights with increased time needed to locate items in the superior and inferior sections of the left side.  He started incorporating a circular visual scanning pattern more after being cued to do so and improved his times on the next couple of intervals to an average time of 2 secs and then 1.9 seconds with times in the left side of the board decreasing down to 2 seconds compared to 11 seconds on the first set.  He him complete 3 timed intervals where the lights were to stay on for 2.5 seconds or until he pressed them, whichever came first.  He scored 27/35, 26/35, and 25/33.  He did exhibit  overall increased scanning to the left but also had some episodes where he did not see them on the right as well, but I feel this was likely due to decreased selective attention rather than field deficit.  Finished session with transition back to the room and transfer back to the bed with mod assist in order to be transferred down to CT for scan.  Returned later in the day for application of NMES to the left shoulder to treat subluxation for 60 mins at setting level 8 intensity.  Pt with no redness and adverse reactions noted to stimulation.  See below for parameters Saebo Stim One 330 pulse width 35 Hz pulse rate On 8 sec/ off 8 sec Ramp up/ down 2 sec Symmetrical Biphasic wave form  Max intensity 186mA at 500 Ohm load   Therapy Documentation Precautions:  Precautions Precautions: Fall Precaution Comments: L UE/LE severe paresis with impaired sensation; impaired safety awareness Restrictions Weight Bearing Restrictions: No   Pain: Pain Assessment Pain Scale: 0-10 Pain Score: 0-No pain ADL: See Care Tool Section for some details of mobility and selfcare  Therapy/Group: Individual Therapy  Bishop Vanderwerf OTR/L 12/30/2019, 12:21 PM

## 2019-12-30 NOTE — Progress Notes (Signed)
Physical Therapy Session Note  Patient Details  Name: Dennis Mercado MRN: [id number] Date of Birth: [birth year]  Today's Date: 12/30/2019 PT Individual Time: 3329-5188 PT Individual Time Calculation (min): 43 min   Short Term Goals: Week 2:  PT Short Term Goal 1 (Week 2): Pt will perform supine<>sit with mod assist PT Short Term Goal 2 (Week 2): Pt will perform sit<>stand with min assist PT Short Term Goal 3 (Week 2): Pt will perform squat pivot transfers with min assist  Skilled Therapeutic Interventions/Progress Updates: Pt presented in bed sleeping but aroused with stimulation. Pt agreeable to therapy. PTA noted that pt had not eaten lunch therefore session focused on bed mobility and assisting with feeding. Pt indicating needing to use urinal thus PTA provided total A for clothing and urinal management (+void). Pt performed rolling L/R, L with minA and R with modA with PTA pulling up brief. Performed supine to sit with modA and cues for sequencing. Performed stand pivot transfer with pt requiring minA for STS and modA for pivot. Pt then spent remaining time eating lunch with PTA placing some items on L to encourage pt to scan if needed. At end of session pt handed off to NT to complete supervision of meal.      Therapy Documentation Precautions:  Precautions Precautions: Fall Precaution Comments: L UE/LE severe paresis with impaired sensation; impaired safety awareness Restrictions Weight Bearing Restrictions: No General:   Vital Signs: Therapy Vitals Temp: 98 F (36.7 C) Pulse Rate: 92 Resp: 18 BP: 102/70 Patient Position (if appropriate): Sitting Oxygen Therapy SpO2: 100 % O2 Device: Room Air Pain: Pain Assessment Pain Scale: 0-10 Pain Score: 5  Pain Type: Acute pain Pain Location: Neck Mobility:   Locomotion :    Trunk/Postural Assessment :    Balance:   Exercises:   Other Treatments:      Therapy/Group: Individual Therapy  Ayasha Ellingsen 12/30/2019,  4:03 PM

## 2019-12-30 NOTE — Progress Notes (Signed)
[facility] PHYSICAL MEDICINE & REHABILITATION PROGRESS NOTE   Subjective/Complaints:  Slept well per RN , very drowsy this morning , no spot c/o HA but when asked answered yes Not oriented to time today Per conference PT, OT< noted cognitive slowing , topamax held, monitoring effect  ROS: Limited by cognition, neg breathing issues no CP Objective:   No results found. Recent Labs    12/27/19 1831  WBC 10.4  HGB 12.3*  HCT 39.3  PLT 600*   Recent Labs    12/27/19 1831 12/29/19 0659  NA 136 138  K 4.5 4.2  CL 101 101  CO2 21* 23  GLUCOSE 123* 96  BUN 46* 40*  CREATININE 1.73* 1.62*  CALCIUM 9.8 9.9    Intake/Output Summary (Last 24 hours) at 12/30/2019 0738 Last data filed at 12/29/2019 2306 Gross per 24 hour  Intake 420 ml  Output 900 ml  Net -480 ml     Physical Exam: Vital Signs Blood pressure 103/74, pulse 85, temperature 98.2 F (36.8 C), resp. rate 18, height 5\' 7"  (1.702 m), weight 71 kg, SpO2 100 %.  General: No acute distress Mood and affect are appropriate Heart: Regular rate and rhythm no rubs murmurs or extra sounds Lungs: Clear to auscultation, breathing unlabored, no rales or wheezes Abdomen: Positive bowel sounds, soft nontender to palpation, nondistended Extremities: No clubbing, cyanosis, or edema Skin: No evidence of breakdown, no evidence of rash  Neurologic: Cranial nerves II through XII intact, motor strength is 5/5 in Right and 0/5 left deltoid, bicep, tricep, grip, hip flexor, knee extensors, ankle dorsiflexor and plantar flexor Sensory exam normal sensation to light touch and proprioception in bilateral upper and lower extremities Cerebellar exam normal finger to nose to finger as well as heel to shin in bilateral upper and lower extremities Musculoskeletal: Full range of motion in all 4 extremities. No joint swelling  Assessment/Plan: 1. Functional deficits secondary to RIght thalamic ICH  which require 3+ hours per day of  interdisciplinary therapy in a comprehensive inpatient rehab setting.  Physiatrist is providing close team supervision and 24 hour management of active medical problems listed below.  Physiatrist and rehab team continue to assess barriers to discharge/monitor patient progress toward functional and medical goals  Care Tool:  Bathing    Body parts bathed by patient: Left arm, Chest, Abdomen, Front perineal area, Buttocks, Right upper leg, Left upper leg, Right lower leg, Left lower leg, Face   Body parts bathed by helper: Right arm     Bathing assist Assist Level: Moderate Assistance - Patient 50 - 74%     Upper Body Dressing/Undressing Upper body dressing Upper body dressing/undressing activity did not occur (including orthotics): N/A What is the patient wearing?: Pull over shirt    Upper body assist Assist Level: Maximal Assistance - Patient 25 - 49%    Lower Body Dressing/Undressing Lower body dressing      What is the patient wearing?: Pants     Lower body assist Assist for lower body dressing: Maximal Assistance - Patient 25 - 49%     Toileting Toileting    Toileting assist Assist for toileting: Set up assist Assistive Device Comment: urinal (assist placing urinal, pt will hold)   Transfers Chair/bed transfer  Transfers assist  Chair/bed transfer activity did not occur: N/A  Chair/bed transfer assist level: Moderate Assistance - Patient 50 - 74%     Locomotion Ambulation   Ambulation assist   Ambulation activity did not occur: Safety/medical concerns  Assist level: 2 helpers (+2 mod assist) Assistive device: Hand held assist Max distance: 13ft   Walk 10 feet activity   Assist  Walk 10 feet activity did not occur: Safety/medical concerns  Assist level: 2 helpers (+2 mod assist) Assistive device: Hand held assist   Walk 50 feet activity   Assist Walk 50 feet with 2 turns activity did not occur: Safety/medical concerns (hasn't been able to  advance to turns)  Assist level: 2 helpers Assistive device: Lite Gait    Walk 150 feet activity   Assist Walk 150 feet activity did not occur: Safety/medical concerns         Walk 10 feet on uneven surface  activity   Assist Walk 10 feet on uneven surfaces activity did not occur: Safety/medical concerns         Wheelchair     Assist Will patient use wheelchair at discharge?: No (No PT lomg term goals set)             Wheelchair 50 feet with 2 turns activity    Assist            Wheelchair 150 feet activity     Assist          Blood pressure 103/74, pulse 85, temperature 98.2 F (36.8 C), resp. rate 18, height 5\' 7"  (1.702 m), weight 71 kg, SpO2 100 %.  Medical Problem List and Plan: 1.  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  Antithrombotics: -DVT/anticoagulation: Subcutaneous heparin initiated 12/11/2019,dopplers neg              -antiplatelet therapy: N/A 3. Pain Management: Tylenol as needed,    Topirimate 25mg  BID  Without headache this morning. D/C topirimate due to increased confusion noted by therapy , if HA return may need to re start  Recheck CT head, hx IVH monitor for hydrocephalus 4. Mood: Provide emotional support             -antipsychotic agents: N/A 5. Neuropsych: This patient is not fully capable of making complex decisions on his own behalf. 6. Skin/Wound Care: Routine skin checks 7. Fluids/Electrolytes/Nutrition: Routine in and outs. 8.  Post stroke dysphagia.  Dysphagia #3 thins liquids.  Follow-up speech therapy.  Advance diet as tolerated 9.  Left lung pneumonia.  Intravenous Unasyn completed 10.  Hypertension.  Norvasc 10 mg daily, Coreg 25 mg twice daily, clonidine 0.3 mg twice daily  Hydralazine 100 mg every 8 hours, d/ced on 7/10  Lisinopril 10 mg twice daily, decreased to 5 twice daily on  7/11.   Vitals:   12/29/19 2041 12/30/19 0354  BP: 106/66 103/74  Pulse: 86 85  Resp:  18  Temp:  98.2 F (36.8 C)  SpO2:  100%   7/14: BP is low. Decrease Lisinopril to 5mg  daily. Monitor effect              Monitor with increased mobility 11.  Urine drug screen positive marijuana.  Counseled on appropriate  12.  Pre renal azotemia worsening  will enc fluid, given IVF x1 bolus  Creatinine 1.61 on 7/6, up to 1.73 on 7/12. Encourage hydration, decrease Lisinopril dose, 7/14.  13.  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  This will likely occur more often at night.  Monitor for signs of alcohol withdrawal although drinking pattern was mainly on the weekends. 14.  Sleep disturbance   Trial Seroquel increased to 50 mg nightly 15.  Leukocytosis   WBCs 12.2 on 7/6, 10.4 on 7/12 16.  Transaminitis: Resolved 17.  Hyponatremia- resolved   Sodium 134 on 7/6, 136 on 7/12. 18.  normochomic normocytic anemia, borderline, stable   Hemoglobin 12.6 on 7/6, 12.3 on 7/12  LOS: 15 days A FACE TO [place] E Ever Gustafson 12/30/2019, 7:38 AM

## 2019-12-30 NOTE — Progress Notes (Signed)
Patient rested well all night and only called for bathroom assistance. Patient memory retention is getting better remembering staff names and situation.

## 2019-12-30 NOTE — Progress Notes (Addendum)
Patient ID: Dennis Mercado, male   DOB: 1980-09-23, 39 y.o.   MRN: [id number]   Family education scheduled for July 19th, 20th and 22nd, 1-3 PM

## 2019-12-30 NOTE — Progress Notes (Signed)
Speech Language Pathology Weekly Progress and Session Note  Patient Details  Name: Dennis Mercado MRN: [id number] Date of Birth: 11-09-1980  Beginning of progress report period: December 23, 2019 End of progress report period: December 30, 2019  Today's Date: 12/30/2019 SLP Individual Time: 1130-1200 SLP Individual Time Calculation (min): 30 min  Short Term Goals: Week 2: SLP Short Term Goal 1 (Week 2): Pt will consume current diet with efficient mastication and oral clearance and min overt s/sx aspiration with Supervision A verbal/visual cues for use of compensatory swallow strategies to minimize anterior loss and oral clearance. SLP Short Term Goal 1 - Progress (Week 2): Met SLP Short Term Goal 2 (Week 2): Pt will demonstrate ability to problem solve basic situations with Mod A verbal/visual cues. SLP Short Term Goal 2 - Progress (Week 2): Progressing toward goal SLP Short Term Goal 3 (Week 2): Pt will sustain attention to functional tasks for 3-5 minute intervals with Mod A verbal/visual cues. SLP Short Term Goal 3 - Progress (Week 2): Met SLP Short Term Goal 4 (Week 2): Pt will recall new and/or daily information with Mod A cues for use of strategies/aids. SLP Short Term Goal 4 - Progress (Week 2): Met SLP Short Term Goal 5 (Week 2): Pt will detect functional errors with Max A multimodal cues. SLP Short Term Goal 5 - Progress (Week 2): Met    New Short Term Goals: Week 3: SLP Short Term Goal 1 (Week 3): Pt will demonstrate efficient mastication and oral clearance of regular texture solids with Supervision A verbal cues fo ruse of strategies across 2 sessions prior to upgrade. SLP Short Term Goal 2 (Week 3): Pt will demonstrate ability to problem solve basic situations with Mod A verbal/visual cues. SLP Short Term Goal 3 (Week 3): Pt will sustain attention to functional tasks for 3 minute intervals with Min A verbal/visual cues. SLP Short Term Goal 4 (Week 3): Pt will recall new and/or daily  information with Mod A cues for use of strategies/aids. SLP Short Term Goal 5 (Week 3): Pt will detect functional errors with Mod A multimodal cues.  Weekly Progress Updates: Pt has made slow but functional gains and met 4 out of 5 short term goals this reporting period. Pt is currently ~Mod-Max assist for basic cognitive tasks due to impairments impacting his problem solving, emergent awareness, short term memory, and attention. Attention deficits are most severely impacting pt's progress. He also demonstrated left visual inattention and has had fluctuating levels of confusion, which has resulted in somewhat slow and inconsistent progress throughout this admission. Pt is consuming dysphagia 3 texture diet (mechanical soft) with thin liquids and is demonstrating excellent carryover of his swallowing strategies for oral clearance. Pt has demonstrated improved sustained attention in short intervals (2-4 minutes), and recall. Pt and family education is ongoing. Pt would continue to benefit from skilled ST while inpatient in order to maximize functional independence and reduce burden of care prior to discharge. Anticipate that pt will need 24/7 supervision at discharge in addition to [place] follow up at next level of care.       Intensity: Minumum of 1-2 x/day, 30 to 90 minutes Frequency: 3 to 5 out of 7 days Duration/Length of Stay: 01/12/20 Treatment/Interventions: Cognitive remediation/compensation;Cueing hierarchy;Dysphagia/aspiration precaution training;Functional tasks;Patient/family education;Therapeutic Activities;Internal/external aids;Environmental controls   Daily Session  Skilled Therapeutic Interventions: Pt was seen for skilled ST targeting cognitive goals. Pt with excellent recall of daily activities today, including being taken for repeat CT scan. Pt also  independently recalled therapist's name on arrival. Pt read and interpreted information from basic medication labels from the [facility] with only  Min A for scanning, Mod A for functional problem solving and organization of a basic QID pill chart. Pt sustained attention for up to 5 minute intervals with Min-Mod A verbal cues for redirection today. Pt left laying in bed with alarm set and needs within reach. Continue per current plan of care.          Pain Pain Assessment Pain Scale: 0-10 Pain Score: 0-No pain  Therapy/Group: Individual Therapy  Arbutus Leas 12/30/2019, 7:15 AM

## 2019-12-31 ENCOUNTER — Inpatient Hospital Stay (HOSPITAL_COMMUNITY): Payer: BC Managed Care – PPO | Admitting: Physical Therapy

## 2019-12-31 ENCOUNTER — Inpatient Hospital Stay (HOSPITAL_COMMUNITY): Payer: BC Managed Care – PPO | Admitting: Speech Pathology

## 2019-12-31 ENCOUNTER — Inpatient Hospital Stay (HOSPITAL_COMMUNITY): Payer: BC Managed Care – PPO | Admitting: Occupational Therapy

## 2019-12-31 DIAGNOSIS — I10 Essential (primary) hypertension: Secondary | ICD-10-CM

## 2019-12-31 NOTE — Progress Notes (Signed)
[place] PHYSICAL MEDICINE & REHABILITATION PROGRESS NOTE   Subjective/Complaints: Patient seen sitting up in bed this morning.  He states he did not sleep well overnight due to noise from another patient.  He states his PRAFO was not placed overnight-discussed with nursing.  He is working with therapy this morning.  He states he had a slight headache this morning due to lack of sleep.  He also notes issues with his bed that caused him to lose sleep with constant beeping.  Discussed with nursing as well.  ROS: Limited by cognition, but appears to deny CP, shortness of breath, nausea, vomiting, diarrhea.  Objective:   CT HEAD WO CONTRAST  Result Date: 12/30/2019 CLINICAL DATA:  Decreased cognition. The patient suffered a intracranial hemorrhage 12/06/2019. EXAM: CT HEAD WITHOUT CONTRAST TECHNIQUE: Contiguous axial images were obtained from the base of the skull through the vertex without intravenous contrast. COMPARISON:  Head CT 12/06/2019 and 12/11/2019. FINDINGS: Brain: There has been continued evolution of the patient's right thalamic hemorrhage which demonstrates decreased attenuation compared to the most recent exam. No new hemorrhage is identified and there is no midline shift, mass or new infarct. Mild dilatation of the ventricular system seen on the most recent examination has decreased and intraventricular hemorrhage has resolved. Vascular: No hyperdense vessel or unexpected calcification. Skull: Intact.  No focal lesion. Sinuses/Orbits: Negative. Other: None. IMPRESSION: Continued evolution of the patient's right thalamic hemorrhage. No new abnormality. Intraventricular hemorrhage seen on the most recent CT has resolved and mild hydrocephalus has improved. Electronically Signed   By: Inge Rise M.D.   On: 12/30/2019 10:46   No results for input(s): WBC, HGB, HCT, PLT in the last 72 hours. Recent Labs    12/29/19 0659  NA 138  K 4.2  CL 101  CO2 23  GLUCOSE 96  BUN 40*   CREATININE 1.62*  CALCIUM 9.9    Intake/Output Summary (Last 24 hours) at 12/31/2019 1053 Last data filed at 12/31/2019 0700 Gross per 24 hour  Intake 610 ml  Output 1450 ml  Net -840 ml     Physical Exam: Vital Signs Blood pressure 111/77, pulse 75, temperature 98 F (36.7 C), resp. rate 16, height 5\' 7"  (1.702 m), weight 71 kg, SpO2 100 %. Constitutional: No distress . Vital signs reviewed. HENT: Normocephalic.  Atraumatic. Eyes: EOMI. No discharge. Cardiovascular: No JVD.  RRR. Respiratory: Normal effort.  No stridor.  Bilaterally clear to auscultation. GI: Non-distended.  BS +. Skin: Warm and dry.  Intact. Psych: Normal mood.  Normal behavior. Musc: No edema in extremities.  No tenderness in extremities. Neuro: Alert Motor: LUE/LLE: 0/5 proximal distal Increased tone noted in left upper extremity/left lower extremity Left wrist and ankle clonus.  Assessment/Plan: 1. 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  Physiatrist is providing close team supervision and 24 hour management of active medical problems listed below.  Physiatrist and rehab team continue to assess barriers to discharge/monitor patient progress toward functional and medical goals  Care Tool:  Bathing    Body parts bathed by patient: Left arm, Chest, Abdomen, Front perineal area, Buttocks, Right upper leg, Left upper leg, Right lower leg, Left lower leg, Face   Body parts bathed by helper: Right arm     Bathing assist Assist Level: Moderate Assistance - Patient 50 - 74%     Upper Body Dressing/Undressing Upper body dressing Upper body dressing/undressing activity  did not occur (including orthotics): N/A What is the patient wearing?: Pull over shirt    Upper body assist Assist Level: Maximal Assistance - Patient 25 - 49%    Lower Body Dressing/Undressing Lower body dressing       What is the patient wearing?: Pants     Lower body assist Assist for lower body dressing: Maximal Assistance - Patient 25 - 49%     Toileting Toileting    Toileting assist Assist for toileting: Set up assist Assistive Device Comment: urinal (assist placing urinal, pt will hold)   Transfers Chair/bed transfer  Transfers assist  Chair/bed transfer activity did not occur: N/A  Chair/bed transfer assist level: Moderate Assistance - Patient 50 - 74% (squat pivot)     Locomotion Ambulation   Ambulation assist   Ambulation activity did not occur: Safety/medical concerns  Assist level: 2 helpers (+2 mod assist) Assistive device: Hand held assist Max distance: 23ft   Walk 10 feet activity   Assist  Walk 10 feet activity did not occur: Safety/medical concerns  Assist level: 2 helpers (+2 mod assist) Assistive device: Hand held assist   Walk 50 feet activity   Assist Walk 50 feet with 2 turns activity did not occur: Safety/medical concerns (hasn't been able to advance to turns)  Assist level: 2 helpers Assistive device: Lite Gait    Walk 150 feet activity   Assist Walk 150 feet activity did not occur: Safety/medical concerns         Walk 10 feet on uneven surface  activity   Assist Walk 10 feet on uneven surfaces activity did not occur: Safety/medical concerns         Wheelchair     Assist Will patient use wheelchair at discharge?: No (No PT lomg term goals set)             Wheelchair 50 feet with 2 turns activity    Assist            Wheelchair 150 feet activity     Assist          Blood pressure 111/77, pulse 75, temperature 98 F (36.7 C), resp. rate 16, height 5\' 7"  (1.702 m), weight 71 kg, SpO2 100 %.  Medical Problem List and Plan: 1.  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  Antithrombotics: -DVT/anticoagulation: Subcutaneous heparin initiated 12/11/2019,dopplers neg              -antiplatelet therapy: N/A 3. Pain Management: Tylenol as needed,    Topirimate 25mg  BID, DC'd due to confusion  Appears controlled on 7/16 4. Mood: Provide emotional support             -antipsychotic agents: N/A 5. Neuropsych: This patient is not fully capable of making complex decisions on his own behalf. 6. Skin/Wound Care: Routine skin checks 7. Fluids/Electrolytes/Nutrition: Routine in and outs. 8.  Post stroke dysphagia.  Dysphagia #3 thins liquids.  Follow-up speech therapy.  Advance diet as tolerated 9.  Left lung pneumonia.  Intravenous Unasyn completed 10.  Hypertension.  Norvasc 10 mg daily, Coreg 25 mg twice daily, clonidine 0.3 mg twice daily  Hydralazine 100 mg every 8 hours, d/ced on 7/10  Lisinopril 10 mg twice daily, decreased to 5 twice daily on 7/11, decreased to daily on 7/14.   Vitals:   12/30/19 2002 12/31/19 0518  BP: 104/78 111/77  Pulse: 78 75  Resp: 18 16  Temp: 98.7 F (37.1 C) 98 F (36.7 C)  SpO2: 100% 100%   Remains relatively soft on 7/16, will not make any changes today given recent changes             Monitor with increased mobility 11.  Urine drug screen positive marijuana.  Counseled on appropriate 12.  Pre renal azotemia worsening:  Creatinine 1.62 on 7/14, labs ordered for Monday  Continue to encourage hydration 13.  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  This will likely occur more often at night.  Monitor for signs of alcohol withdrawal although drinking pattern was mainly on the weekends. 14.  Sleep disturbance   Trial Seroquel increased to 50 mg nightly 15.  Leukocytosis: Resolved   WBCs 10.4 on 7/12 16.  Transaminitis: Resolved 17.  Hyponatremia- resolved  18.  Normochomic anemia  Hemoglobin 12.3 on 7/14  LOS: 16 days A FACE TO FACE EVALUATION WAS PERFORMED  Caterra Ostroff Lorie Phenix 12/31/2019,  10:53 AM

## 2019-12-31 NOTE — Progress Notes (Signed)
Physical Therapy Session Note  Patient Details  Name: Dennis Mercado MRN: [id number] Date of Birth: [birth year]  Today's Date: 12/31/2019 PT Individual Time: 0820-0903 PT Individual Time Calculation (min): 43 min   and  Today's Date: 12/31/2019 PT Missed Time: 17 Minutes Missed Time Reason: Other (Comment) (meal)  Short Term Goals: Week 3:  PT Short Term Goal 1 (Week 3): Pt will perform supine<>sit with min assist PT Short Term Goal 2 (Week 3): Pt will perform sit<>stand with min assist PT Short Term Goal 3 (Week 3): Pt will perform bed<>chair transfers with min assist  Skilled Therapeutic Interventions/Progress Updates:   Pt received in bed with his sister, who is an NT on another floor at [facility], present assisting pt with breakfast. Pt agreeable to therapy session but requests time to complete meal prior. Pt requests 20 minutes - educated on missing therapy time to ensure pt in agreement. Missed 17 minutes of skilled physical therapy.    Therapist returned to room and pt supine in bed and agreeable to therapy session. Reports not sleeping well last night. Donned B LE TED hose total assist. Supine>sitting R EOB with mod assist for L hemibody management and trunk upright. Sitting EOB with close supervision and R UE support donned pants and shoes max assist. Sit>stand min assist lifting and mod assist for balance once standing due to L lean - pulled up pants mod assist. L squat pivot EOB>w/c min/mod assist for pivoting hips.  Transported to/from gym in w/c for time management and energy conservation. Donned L UE GivMohr sling (still not fitting properly - orthotist planning to come) and L LE ankle DF ACE wrap and leg lifter to facilitate more normalized gait mechanics. Gait training ~39ft with R HHA and +2 mod assist for balance - max/total assist L LE control during stance to prevent buckling and during swing for advancement - continues to have significantly impaired perceptual awareness of  the sequencing of gait and no proprioceptive awareness of L LE positioning during gait requiring visual and verbal feedback for improved muscle activation and sequencing - continues to have significantly decreased R LE step length and often landing on his toes/midfoot on initial contact, cuing to improve but minimal change noted. Demos improving trunk control during gait. Doffed equipment used for gait facilitation. Transported back to room and pt left seated tilted back in TIS w/c with needs in reach, L UE supported on pillows, and seat belt alarm on.  Therapy Documentation Precautions:  Precautions Precautions: Fall Precaution Comments: L UE/LE severe paresis with impaired sensation; impaired safety awareness Restrictions Weight Bearing Restrictions: No  Pain:   Continues to report some L LE pain during supine>sit as described during yesterday's PM session - repositioning for pain management.   Therapy/Group: Individual Therapy  Tawana Scale , PT, DPT, CSRS  12/31/2019, 7:55 AM

## 2019-12-31 NOTE — Progress Notes (Signed)
Speech Language Pathology Daily Session Note  Patient Details  Name: Sylvester Minton MRN: [id number] Date of Birth: 01/08/1981  Today's Date: 12/31/2019 SLP Individual Time: 1101-1200 SLP Individual Time Calculation (min): 59 min  Short Term Goals: Week 3: SLP Short Term Goal 1 (Week 3): Pt will demonstrate efficient mastication and oral clearance of regular texture solids with Supervision A verbal cues fo ruse of strategies across 2 sessions prior to upgrade. SLP Short Term Goal 2 (Week 3): Pt will demonstrate ability to problem solve basic situations with Mod A verbal/visual cues. SLP Short Term Goal 3 (Week 3): Pt will sustain attention to functional tasks for 3 minute intervals with Min A verbal/visual cues. SLP Short Term Goal 4 (Week 3): Pt will recall new and/or daily information with Mod A cues for use of strategies/aids. SLP Short Term Goal 5 (Week 3): Pt will detect functional errors with Mod A multimodal cues.  Skilled Therapeutic Interventions: Pt was seen for skilled ST targeting cognitive goals. Pt interpreted basic weekly weather forecasts with overall Supervision A verbal cues, slightly increased Min A cues for use of calculator required for calculating differenced between high and low temperatures. Mod A verbal and and visual cues for visual scanning left to right. Pt also recreated a basic PEG board design from a photograph with Min A for problem solving and error awareness. Increased Mod A required for problem solving and Max A for error awareness required when recreating a design of increased complexity (semi-complex). Pt sustained attention to tasks far better today in comparison to earlier in week. Only Min A verbal cues required for redirection to functional tasks and maintaining topics of conversation. Pt left laying in bed with alarm set and needs within reach. Continue per current plan of care.        Pain Pain Assessment Pain Scale: 0-10 Pain Score: 0-No  pain   Therapy/Group: Individual Therapy  Arbutus Leas 12/31/2019, 7:18 AM

## 2019-12-31 NOTE — Progress Notes (Signed)
Occupational Therapy Session Note  Patient Details  Name: Dennis Mercado MRN: [id number] Date of Birth: 24-Sep-1980  Today's Date: 12/31/2019 OT Individual Time: 1400-1445 OT Individual Time Calculation (min): 45 min    Short Term Goals: Week 3:  OT Short Term Goal 1 (Week 3): Pt will complete UB dressing with min assist following hemi dressing techniques. OT Short Term Goal 2 (Week 3): Pt will donn pants and brief with mod assist following hemi techniques for two consecutive sessions. OT Short Term Goal 3 (Week 3): Pt will complete sit to stand with min assist during LB selfcare tasks. OT Short Term Goal 4 (Week 3): Pt will complete toilet transfer with min assist to the 3:1 squat or stand pivot OT Short Term Goal 5 (Week 3): Pt will sequence through bathing tasks with no more than mod instructional cueing.  Skilled Therapeutic Interventions/Progress Updates:    Pt in bed to start session, worked on transition from supine to sit EOB with overall mod assist and mod demonstrational cueing for technique.  He was then able to transfer to the wheelchair at Dennis Mercado assist level as well.  Nursing came in to administer medications for pain that pt requested and then he was transported down to the therapy gym.  Mod assist for transfer to the therapy mat stand pivot.  Had him work on LUE weightbearing tasks to start with max facilitation to maintain support with elbow extension while reaching to target across his body to the left side, using the RUE.  He also worked on AROM at the left shoulder with use of a small therapy ball on top of a sliding board positioned in his lap.  He was able to inconsistently demonstrate trace shoulder internal rotation with activity.  Had pt transfer back to the wheelchair at end of session with orthotist coming in to re-assess the positioning and fit of the [facility]  She plans to make modifications and bring back within the next couple of days.  Finished session with transfer  back to the room and then back to the bed at mod assist level.  He was left with call button and phone in reach with safety alarm in place.    Therapy Documentation Precautions:  Precautions Precautions: Fall Precaution Comments: L UE/LE severe paresis with impaired sensation; impaired safety awareness Restrictions Weight Bearing Restrictions: No   Pain: Pain Assessment Pain Scale: Faces Pain Score: 0-No pain ADL: See Care Tool Section for some details of mobility and selfcare  Therapy/Group: Individual Therapy  Dennis Mercado OTR/L 12/31/2019, 3:34 PM

## 2019-12-31 NOTE — Progress Notes (Signed)
A&Ox4. VSS. Participated in all therapies this shift. PRN Tylenol given for headache/neck pain. No acute events this shift.

## 2020-01-01 ENCOUNTER — Inpatient Hospital Stay (HOSPITAL_COMMUNITY): Payer: BC Managed Care – PPO | Admitting: Physical Therapy

## 2020-01-01 NOTE — Progress Notes (Signed)
Physical Therapy Session Note  Patient Details  Name: Dennis Mercado MRN: [id number] Date of Birth: September 29, 1980  Today's Date: 01/01/2020 PT Individual Time: 0905-1008 PT Individual Time Calculation (min): 63 min   Short Term Goals: Week 3:  PT Short Term Goal 1 (Week 3): Pt will perform supine<>sit with min assist PT Short Term Goal 2 (Week 3): Pt will perform sit<>stand with min assist PT Short Term Goal 3 (Week 3): Pt will perform bed<>chair transfers with min assist  Skilled Therapeutic Interventions/Progress Updates:    Pt received supine in bed and agreeable to therapy session. Supine>sitting R EOB with min assist for L hemibody management and max multimodal cuing for sequencing. Sitting EOB supervision for trunk control using R UE support while therapist donned shoes max assist. L squat pivot to w/c with min/mod assist for lifting/pivoting hips - cuing for head/hips relationship and manual facilitation for L LE positioning/placement/WBing. Transported to/from gym in w/c for time management and energy conservation. L squat pivot to EOM min/mod assist. Sit>supine min assist L hemibody management onto mat. Manual assist for L LE placement into hooklying position - pt unable to actively perform hip/knee flexion in supine. Supine bridging x15reps (1-3 second hold at top) with min assist to keep L knee from falling in/out - max cuing for increased hip extension and to activate muscles to keep L knee pointed towards ceiling. Supported L UE shoulder external rotation stretch throughout. Supine>sitting L EOB mod assist for L hemibody management and trunk upright. L squat pivot to w/c min/mod assist. Sitting in w/c>tall kneeling on mat with +2 mod assist for balance and total assist for L LE placement onto mat. In tall kneeling with L UE weightbearing on bench and mirror feedback focused on midline orientation varying from CGA to mod assist due to L posterior lean/LOB - progressed to L visual scanning and  cross body reaching task with mod assist for balance when weight shifting to L. Tall kneeling>supine>sitting EOM mod assist and max cuing. L squat pivot min/mod A to w/c. Therapist retrieved RW and placed L hand-splint to facilitate grasp. Donned L LE sprystep max GRAFO to assist with knee extension in stance and ankle DF during swing and toe shoe cover for decreased resistance in swing. Gait training ~87ft x2 using RW with mod/max assist of 1 and +2 intermittent min assist for AD management - requires max assist for L LE advancement during swing (pt able to initiate) and varying min to max assist to block L knee buckle during stance (demos quad activation when cued and when facilitating heel strike on initial contact) - max sequential cuing throughout for R weight shift in R stance, L LE hip/knee extension in L stance, and L hip/knee flexion in swing. Transported back to room in w/c and left sitting up tilted back with needs in reach, seat belt alarm on, and L UE supported on pillow. [employer] e-stim (details below) to L deltoid targeting joint approximation due to subluxation for 60 minutes at 8 clicks. Returned afterwards to remove device and pt with no redness, skin intact, and pt denies any adverse reactions.  [employer] Stim One 330 pulse width 35 Hz pulse rate On 8 sec/ off 8 sec Ramp up/ down 2 sec Symmetrical Biphasic wave form  Max intensity 166mA at 500 Ohm load  Therapy Documentation Precautions:  Precautions Precautions: Fall Precaution Comments: L UE/LE severe paresis with impaired sensation; impaired safety awareness Restrictions Weight Bearing Restrictions: No  Pain: No reports of pain throughout  session.   Therapy/Group: Individual Therapy  Tawana Scale , PT, DPT, CSRS  01/01/2020, 7:45 AM

## 2020-01-01 NOTE — Progress Notes (Signed)
A&Ox4. Medicated for headache/leg pain x2 with good result. Wife in to visit today. Participated in all therapies. No acute events this shift.

## 2020-01-01 NOTE — Progress Notes (Signed)
[place] PHYSICAL MEDICINE & REHABILITATION PROGRESS NOTE   Subjective/Complaints: C/o of noise from other patient again. Wore ear plugs which helped. Mild headache  ROS: Patient denies fever, rash, sore throat, blurred vision, nausea, vomiting, diarrhea, cough, shortness of breath or chest pain, joint or back pain,  or mood change.   Objective:   No results found. No results for input(s): WBC, HGB, HCT, PLT in the last 72 hours. No results for input(s): NA, K, CL, CO2, GLUCOSE, BUN, CREATININE, CALCIUM in the last 72 hours.  Intake/Output Summary (Last 24 hours) at 01/01/2020 1036 Last data filed at 01/01/2020 0730 Gross per 24 hour  Intake 660 ml  Output 1150 ml  Net -490 ml     Physical Exam: Vital Signs Blood pressure 114/82, pulse 76, temperature 98 F (36.7 C), resp. rate 18, height 5\' 7"  (1.702 m), weight 71 kg, SpO2 100 %. Constitutional: No distress . Vital signs reviewed. HEENT: EOMI, oral membranes moist Neck: supple Cardiovascular: RRR without murmur. No JVD    Respiratory/Chest: CTA Bilaterally without wheezes or rales. Normal effort    GI/Abdomen: BS +, non-tender, non-distended Ext: no clubbing, cyanosis, or edema Psych: pleasant and cooperative Musc: No edema in extremities.  No tenderness in extremities. Neuro: Alert Motor: LUE/LLE: 0/5 proximal distal, senses pain LUE and LLE Increased tone noted in left upper extremity/left lower extremity Left wrist and ankle clonus.  Assessment/Plan: 1. 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  Physiatrist is providing close team supervision and 24 hour management of active medical problems listed below.  Physiatrist and rehab team continue to assess barriers to discharge/monitor patient progress toward functional and medical goals  Care Tool:  Bathing    Body parts bathed by patient: Left arm,  Chest, Abdomen, Front perineal area, Buttocks, Right upper leg, Left upper leg, Right lower leg, Left lower leg, Face   Body parts bathed by helper: Right arm     Bathing assist Assist Level: Moderate Assistance - Patient 50 - 74%     Upper Body Dressing/Undressing Upper body dressing Upper body dressing/undressing activity did not occur (including orthotics): N/A What is the patient wearing?: Pull over shirt    Upper body assist Assist Level: Maximal Assistance - Patient 25 - 49%    Lower Body Dressing/Undressing Lower body dressing      What is the patient wearing?: Pants     Lower body assist Assist for lower body dressing: Maximal Assistance - Patient 25 - 49%     Toileting Toileting    Toileting assist Assist for toileting: Set up assist Assistive Device Comment: urinal (assist placing urinal, pt will hold)   Transfers Chair/bed transfer  Transfers assist  Chair/bed transfer activity did not occur: N/A  Chair/bed transfer assist level: Moderate Assistance - Patient 50 - 74% (squat pivot)     Locomotion Ambulation   Ambulation assist   Ambulation activity did not occur: Safety/medical concerns  Assist level: 2 helpers (+2 mod A) Assistive device: Hand held assist Max distance: 27ft   Walk 10 feet activity   Assist  Walk 10 feet activity did not occur: Safety/medical concerns  Assist level: 2 helpers (+2 mod A) Assistive device: Hand held assist   Walk 50 feet activity   Assist Walk 50 feet with 2 turns activity did not occur: Safety/medical concerns (not turning yet)  Assist level: 2 helpers Assistive device: Lite Gait  Walk 150 feet activity   Assist Walk 150 feet activity did not occur: Safety/medical concerns         Walk 10 feet on uneven surface  activity   Assist Walk 10 feet on uneven surfaces activity did not occur: Safety/medical concerns         Wheelchair     Assist Will patient use wheelchair at discharge?:  No (No PT lomg term goals set)             Wheelchair 50 feet with 2 turns activity    Assist            Wheelchair 150 feet activity     Assist          Blood pressure 114/82, pulse 76, temperature 98 F (36.7 C), resp. rate 18, height 5\' 7"  (1.702 m), weight 71 kg, SpO2 100 %.  Medical Problem List and Plan: 1.  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 Explained why he might experience shock like sensations from the thalamic hermorrhage 2.  Antithrombotics: -DVT/anticoagulation: Subcutaneous heparin initiated 12/11/2019,dopplers neg              -antiplatelet therapy: N/A 3. Pain Management: Tylenol as needed,    Topirimate 25mg  BID, DC'd due to confusion  Appears controlled on 7/17 4. Mood: Provide emotional support             -antipsychotic agents: N/A 5. Neuropsych: This patient is not fully capable of making complex decisions on his own behalf. 6. Skin/Wound Care: Routine skin checks 7. Fluids/Electrolytes/Nutrition: Routine in and outs. 8.  Post stroke dysphagia.  Dysphagia #3 thins liquids.  Follow-up speech therapy.  Advance diet as tolerated 9.  Left lung pneumonia.  Intravenous Unasyn completed 10.  Hypertension.  Norvasc 10 mg daily, Coreg 25 mg twice daily, clonidine 0.3 mg twice daily  Hydralazine 100 mg every 8 hours, d/ced on 7/10  Lisinopril 10 mg twice daily, decreased to 5 twice daily on 7/11, decreased to daily on 7/14.   Vitals:   12/31/19 1957 01/01/20 0532  BP: 111/74 114/82  Pulse: 77 76  Resp: 18 18  Temp: 98.3 F (36.8 C) 98 F (36.7 C)  SpO2: 100% 100%   Sl soft on 7/17, no changes today             Monitor with increased mobility 11.  Urine drug screen positive marijuana.  Counseled on appropriate 12.  Pre renal azotemia  worsening:  Creatinine 1.62 on 7/14, labs ordered for Monday  Continue to encourage hydration 13.  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  This will likely occur more often at night.  Monitor for signs of alcohol withdrawal although drinking pattern was mainly on the weekends. 14.  Sleep disturbance   Trial Seroquel increased to 50 mg nightly 15.  Leukocytosis: Resolved   WBCs 10.4 on 7/12 16.  Transaminitis: Resolved 17.  Hyponatremia- resolved  18.  Normochomic anemia  Hemoglobin 12.3 on 7/14  LOS: 17 days A FACE TO [place] 01/01/2020, 10:36 AM

## 2020-01-02 NOTE — Progress Notes (Signed)
[place] PHYSICAL MEDICINE & REHABILITATION PROGRESS NOTE   Subjective/Complaints: Had a better night despite some of the outside noise  ROS: Patient denies fever, rash, sore throat, blurred vision, nausea, vomiting, diarrhea, cough, shortness of breath or chest pain, joint or back pain, headache, or mood change. .   Objective:   No results found. No results for input(s): WBC, HGB, HCT, PLT in the last 72 hours. No results for input(s): NA, K, CL, CO2, GLUCOSE, BUN, CREATININE, CALCIUM in the last 72 hours.  Intake/Output Summary (Last 24 hours) at 01/02/2020 0921 Last data filed at 01/02/2020 0834 Gross per 24 hour  Intake 940 ml  Output --  Net 940 ml     Physical Exam: Vital Signs Blood pressure 100/73, pulse 77, temperature 98.1 F (36.7 C), resp. rate 16, height 5\' 7"  (1.702 m), weight 71 kg, SpO2 99 %. Constitutional: No distress . Vital signs reviewed. HEENT: EOMI, oral membranes moist Neck: supple Cardiovascular: RRR without murmur. No JVD    Respiratory/Chest: CTA Bilaterally without wheezes or rales. Normal effort    GI/Abdomen: BS +, non-tender, non-distended Ext: no clubbing, cyanosis, or edema Psych: pleasant and cooperative Musc: No edema in extremities.  No tenderness in extremities with ROM Neuro: Alert Motor: LUE/LLE: 0/5 proximal distal, senses pain LUE and LLE Increased tone noted in left upper extremity/left lower extremity Left wrist and ankle clonus ongoing.  Assessment/Plan: 1. 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  Physiatrist is providing close team supervision and 24 hour management of active medical problems listed below.  Physiatrist and rehab team continue to assess barriers to discharge/monitor patient progress toward functional and medical goals  Care Tool:  Bathing    Body parts bathed by patient: Left arm, Chest,  Abdomen, Front perineal area, Buttocks, Right upper leg, Left upper leg, Right lower leg, Left lower leg, Face   Body parts bathed by helper: Right arm     Bathing assist Assist Level: Moderate Assistance - Patient 50 - 74%     Upper Body Dressing/Undressing Upper body dressing Upper body dressing/undressing activity did not occur (including orthotics): N/A What is the patient wearing?: Pull over shirt    Upper body assist Assist Level: Maximal Assistance - Patient 25 - 49%    Lower Body Dressing/Undressing Lower body dressing      What is the patient wearing?: Pants     Lower body assist Assist for lower body dressing: Maximal Assistance - Patient 25 - 49%     Toileting Toileting    Toileting assist Assist for toileting: Set up assist Assistive Device Comment: urinal (assist placing urinal, pt will hold)   Transfers Chair/bed transfer  Transfers assist  Chair/bed transfer activity did not occur: N/A  Chair/bed transfer assist level: Moderate Assistance - Patient 50 - 74% (squat pivot)     Locomotion Ambulation   Ambulation assist   Ambulation activity did not occur: Safety/medical concerns  Assist level: 2 helpers (mod/max A of 1 & min A of 2nd) Assistive device: Walker-rolling Max distance: 75ft   Walk 10 feet activity   Assist  Walk 10 feet activity did not occur: Safety/medical concerns  Assist level: 2 helpers (mod/max A of 1 & min A of 2nd) Assistive device: Walker-rolling   Walk 50 feet activity   Assist Walk 50 feet with 2 turns activity did not occur: Safety/medical concerns (not turning yet)  Assist level:  2 helpers Assistive device: Lite Gait    Walk 150 feet activity   Assist Walk 150 feet activity did not occur: Safety/medical concerns         Walk 10 feet on uneven surface  activity   Assist Walk 10 feet on uneven surfaces activity did not occur: Safety/medical concerns         Wheelchair     Assist Will  patient use wheelchair at discharge?: No (No PT lomg term goals set)             Wheelchair 50 feet with 2 turns activity    Assist            Wheelchair 150 feet activity     Assist          Blood pressure 100/73, pulse 77, temperature 98.1 F (36.7 C), resp. rate 16, height 5\' 7"  (1.702 m), weight 71 kg, SpO2 99 %.  Medical Problem List and Plan: 1.  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  Antithrombotics: -DVT/anticoagulation: Subcutaneous heparin initiated 12/11/2019,dopplers neg              -antiplatelet therapy: N/A 3. Pain Management: Tylenol as needed,    Topirimate 25mg  BID, DC'd due to confusion  Appears controlled on 7/18  4. Mood: Provide emotional support             -antipsychotic agents: N/A 5. Neuropsych: This patient is not fully capable of making complex decisions on his own behalf. 6. Skin/Wound Care: Routine skin checks 7. Fluids/Electrolytes/Nutrition: Routine in and outs. 8.  Post stroke dysphagia.  Dysphagia #3 thins liquids.  Follow-up speech therapy.  Advance diet as tolerated 9.  Left lung pneumonia.  Intravenous Unasyn completed 10.  Hypertension.  Norvasc 10 mg daily, Coreg 25 mg twice daily, clonidine 0.3 mg twice daily  Hydralazine 100 mg every 8 hours, d/ced on 7/10  Lisinopril 10 mg twice daily, decreased to 5 twice daily on 7/11, decreased to daily on 7/14.   Vitals:   01/01/20 1947 01/02/20 0551  BP: (!) 122/94 100/73  Pulse: 93 77  Resp: 16 16  Temp: 99.1 F (37.3 C) 98.1 F (36.7 C)  SpO2: 100% 99%   7/18 fair control. No changes              11.  Urine drug screen positive marijuana.  Counseled on appropriate 12.  Pre renal azotemia worsening:  Creatinine 1.62 on 7/14, labs ordered for Monday  Continue to encourage  hydration 13.  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  This will likely occur more often at night.  Monitor for signs of alcohol withdrawal although drinking pattern was mainly on the weekends. 14.  Sleep disturbance   Trial Seroquel increased to 50 mg nightly with improvement 15.  Leukocytosis: Resolved   WBCs 10.4 on 7/12 16.  Transaminitis: Resolved 17.  Hyponatremia- resolved  18.  Normochomic anemia  Hemoglobin 12.3 on 7/14  LOS: 18 days A FACE TO [place] 01/02/2020, 9:21 AM

## 2020-01-03 ENCOUNTER — Inpatient Hospital Stay (HOSPITAL_COMMUNITY): Payer: BC Managed Care – PPO | Admitting: Occupational Therapy

## 2020-01-03 ENCOUNTER — Encounter (HOSPITAL_COMMUNITY): Payer: BC Managed Care – PPO | Admitting: Speech Pathology

## 2020-01-03 ENCOUNTER — Ambulatory Visit (HOSPITAL_COMMUNITY): Payer: BC Managed Care – PPO

## 2020-01-03 ENCOUNTER — Encounter (HOSPITAL_COMMUNITY): Payer: BC Managed Care – PPO | Admitting: Occupational Therapy

## 2020-01-03 LAB — BASIC METABOLIC PANEL
Anion gap: 12 (ref 5–15)
BUN: 23 mg/dL — ABNORMAL HIGH (ref 6–20)
CO2: 26 mmol/L (ref 22–32)
Calcium: 9.9 mg/dL (ref 8.9–10.3)
Chloride: 103 mmol/L (ref 98–111)
Creatinine, Ser: 1.33 mg/dL — ABNORMAL HIGH (ref 0.61–1.24)
GFR calc Af Amer: >60 mL/min (ref >60)
GFR calc non Af Amer: >60 mL/min (ref >60)
Glucose, Bld: 88 mg/dL (ref 70–99)
Potassium: 4.5 mmol/L (ref 3.5–5.1)
Sodium: 141 mmol/L (ref 135–145)

## 2020-01-03 MED ORDER — CLONIDINE HCL 0.1 MG PO TABS
0.2000 mg | ORAL_TABLET | Freq: Two times a day (BID) | ORAL | Status: DC
  Administered 2020-01-03 – 2020-01-06 (×6): 0.2 mg via ORAL
  Filled 2020-01-03 (×6): qty 2

## 2020-01-03 NOTE — Plan of Care (Signed)
°  Problem: RH BLADDER ELIMINATION Goal: RH STG MANAGE BLADDER WITH ASSISTANCE Description: STG Manage Bladder With min Assistance Outcome: Progressing   Problem: RH SKIN INTEGRITY Goal: RH STG SKIN FREE OF INFECTION/BREAKDOWN Description: Pt will be free of skin breakdown/infection with min assist during CIR stay Outcome: Progressing   Problem: RH SAFETY Goal: RH STG ADHERE TO SAFETY PRECAUTIONS W/ASSISTANCE/DEVICE Description: STG Adhere to Safety Precautions With cues/reminders Assistance/Device. Outcome: Progressing

## 2020-01-03 NOTE — Progress Notes (Signed)
Occupational Therapy Session Note  Patient Details  Name: Dennis Mercado MRN: [id number] Date of Birth: 12/24/1980  Today's Date: 01/03/2020 OT Individual Time: 0940-7680 OT Individual Time Calculation (min): 30 min    Short Term Goals: Week 3:  OT Short Term Goal 1 (Week 3): Pt will complete UB dressing with min assist following hemi dressing techniques. OT Short Term Goal 2 (Week 3): Pt will donn pants and brief with mod assist following hemi techniques for two consecutive sessions. OT Short Term Goal 3 (Week 3): Pt will complete sit to stand with min assist during LB selfcare tasks. OT Short Term Goal 4 (Week 3): Pt will complete toilet transfer with min assist to the 3:1 squat or stand pivot OT Short Term Goal 5 (Week 3): Pt will sequence through bathing tasks with no more than mod instructional cueing.  Skilled Therapeutic Interventions/Progress Updates:    Patient in bed, alert and ready for therapy session.  Completed left LE stretching due to pain with transitional movement - with good results.  Supine to sitting with min A.  Completed oral care in unsupported sitting position edge of bed with set up/CS.  Completed trunk/left side motor control activities with focus on trunk symmetry, midline, rotation, scapular positioning, weight bearing left UE, inhibition of flexors, balance and awareness of left side.  Sit to stand with min A.  Side stepping max A.  Sit to supine mod A.  He remained in bed at close of session.  Nursing in to take blood - will position bed upon completion.    Therapy Documentation Precautions:  Precautions Precautions: Fall Precaution Comments: L UE/LE severe paresis with impaired sensation; impaired safety awareness Restrictions Weight Bearing Restrictions: No  Therapy/Group: Individual Therapy  Carlos Levering 01/03/2020, 7:38 AM

## 2020-01-03 NOTE — Progress Notes (Signed)
[place] PHYSICAL MEDICINE & REHABILITATION PROGRESS NOTE   Subjective/Complaints: Nursing asking about BP meds , pt denies HA, slept ok  ROS: Patient denies CP, SOB, N/V/D Objective:   No results found. No results for input(s): WBC, HGB, HCT, PLT in the last 72 hours. No results for input(s): NA, K, CL, CO2, GLUCOSE, BUN, CREATININE, CALCIUM in the last 72 hours.  Intake/Output Summary (Last 24 hours) at 01/03/2020 0849 Last data filed at 01/03/2020 0759 Gross per 24 hour  Intake 962 ml  Output 1150 ml  Net -188 ml     Physical Exam: Vital Signs Blood pressure 106/75, pulse 71, temperature 98.7 F (37.1 C), temperature source Oral, resp. rate 18, height 5\' 7"  (1.702 m), weight 71 kg, SpO2 100 %.  General: No acute distress Mood and affect are appropriate Heart: Regular rate and rhythm no rubs murmurs or extra sounds Lungs: Clear to auscultation, breathing unlabored, no rales or wheezes Abdomen: Positive bowel sounds, soft nontender to palpation, nondistended Extremities: No clubbing, cyanosis, or edema Skin: No evidence of breakdown, no evidence of rash  Motor: LUE/LLE: 0/5 proximal distal, senses pain LUE and LLE Increased tone noted in left upper extremity/left lower extremity Left wrist and ankle clonus ongoing.  Assessment/Plan: 1. 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  Physiatrist is providing close team supervision and 24 hour management of active medical problems listed below.  Physiatrist and rehab team continue to assess barriers to discharge/monitor patient progress toward functional and medical goals  Care Tool:  Bathing    Body parts bathed by patient: Left arm, Chest, Abdomen, Front perineal area, Buttocks, Right upper leg, Left upper leg, Right lower leg, Left lower leg, Face   Body parts bathed by helper: Right arm     Bathing  assist Assist Level: Moderate Assistance - Patient 50 - 74%     Upper Body Dressing/Undressing Upper body dressing Upper body dressing/undressing activity did not occur (including orthotics): N/A What is the patient wearing?: Pull over shirt    Upper body assist Assist Level: Maximal Assistance - Patient 25 - 49%    Lower Body Dressing/Undressing Lower body dressing      What is the patient wearing?: Pants     Lower body assist Assist for lower body dressing: Maximal Assistance - Patient 25 - 49%     Toileting Toileting    Toileting assist Assist for toileting: Set up assist Assistive Device Comment: urinal (assist placing urinal, pt will hold)   Transfers Chair/bed transfer  Transfers assist  Chair/bed transfer activity did not occur: N/A  Chair/bed transfer assist level: Moderate Assistance - Patient 50 - 74% (squat pivot)     Locomotion Ambulation   Ambulation assist   Ambulation activity did not occur: Safety/medical concerns  Assist level: 2 helpers (mod/max A of 1 & min A of 2nd) Assistive device: Walker-rolling Max distance: 73ft   Walk 10 feet activity   Assist  Walk 10 feet activity did not occur: Safety/medical concerns  Assist level: 2 helpers (mod/max A of 1 & min A of 2nd) Assistive device: Walker-rolling   Walk 50 feet activity   Assist Walk 50 feet with 2 turns activity did not occur: Safety/medical concerns (not turning yet)  Assist level: 2 helpers Assistive device: Lite Gait    Walk 150 feet activity   Assist Walk 150 feet activity did not occur: Safety/medical concerns  Walk 10 feet on uneven surface  activity   Assist Walk 10 feet on uneven surfaces activity did not occur: Safety/medical concerns         Wheelchair     Assist Will patient use wheelchair at discharge?: No (No PT lomg term goals set)             Wheelchair 50 feet with 2 turns activity    Assist            Wheelchair  150 feet activity     Assist          Blood pressure 106/75, pulse 71, temperature 98.7 F (37.1 C), temperature source Oral, resp. rate 18, height 5\' 7"  (1.702 m), weight 71 kg, SpO2 100 %.  Medical Problem List and Plan: 1.  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  Antithrombotics: -DVT/anticoagulation: Subcutaneous heparin initiated 12/11/2019,dopplers neg              -antiplatelet therapy: N/A 3. Pain Management: Tylenol as needed,    Topirimate 25mg  BID, DC'd due to confusion  Appears controlled on 7/18  4. Mood: Provide emotional support             -antipsychotic agents: N/A 5. Neuropsych: This patient is not fully capable of making complex decisions on his own behalf. 6. Skin/Wound Care: Routine skin checks 7. Fluids/Electrolytes/Nutrition: Routine in and outs. 8.  Post stroke dysphagia.  Dysphagia #3 thins liquids.  Follow-up speech therapy.  Advance diet as tolerated 9.  Left lung pneumonia.  Intravenous Unasyn completed 10.  Hypertension.  Norvasc 10 mg daily, Coreg 25 mg twice daily, clonidine 0.3 mg twice daily  Hydralazine 100 mg every 8 hours, d/ced on 7/10  Lisinopril 10 mg twice daily, decreased to 5 twice daily on 7/11, decreased to daily on 7/14.   Vitals:   01/03/20 0457 01/03/20 0828  BP: 107/86 106/75  Pulse: 71   Resp: 18   Temp: 98.7 F (37.1 C)   SpO2: 100%    7/19 controlled but a little soft will reduce clonidine to 0.2mg                11.  Urine drug screen positive marijuana.  Counseled on appropriate 12.  Pre renal azotemia worsening:  Creatinine 1.62 on 7/14, labs ordered for Monday  Continue to encourage hydration 13.  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  This will likely occur more often at night.  Monitor for signs of  alcohol withdrawal although drinking pattern was mainly on the weekends. 14.  Sleep disturbance   Trial Seroquel increased to 50 mg nightly with improvement 15.  Leukocytosis: Resolved   WBCs 10.4 on 7/12 16.  Transaminitis: Resolved 17.  Hyponatremia- resolved  18.  Normochomic anemia  Hemoglobin 12.3 on 7/14  LOS: 19 days A FACE TO [place] E Areen Trautner 01/03/2020, 8:49 AM

## 2020-01-03 NOTE — Progress Notes (Addendum)
Occupational Therapy Session Note  Patient Details  Name: Dennis Mercado MRN: [id number] Date of Birth: 1980-11-27  Today's Date: 01/03/2020 OT Individual Time: 5638-7564 OT Individual Time Calculation (min): 43 min    Short Term Goals: Week 3:  OT Short Term Goal 1 (Week 3): Pt will complete UB dressing with min assist following hemi dressing techniques. OT Short Term Goal 2 (Week 3): Pt will donn pants and brief with mod assist following hemi techniques for two consecutive sessions. OT Short Term Goal 3 (Week 3): Pt will complete sit to stand with min assist during LB selfcare tasks. OT Short Term Goal 4 (Week 3): Pt will complete toilet transfer with min assist to the 3:1 squat or stand pivot OT Short Term Goal 5 (Week 3): Pt will sequence through bathing tasks with no more than mod instructional cueing.  Skilled Therapeutic Interventions/Progress Updates:    Session 1: (3329-5188) Pt completed shower and dressing during session.  He was able to transfer from supine to sit EOB with mod assist and then he completed transfer to the shower with use of the [place] secondary to time limitations.  He was able to complete sit to stand in the [place] as well as from the shower seat to undress with min assist.  Min assist was needed for removal of his pullover shirt with mod assist to remove brief, pants, and gripper socks.  He was able to complete all bathing sit to stand with mod assist.  He needed max hand over hand assistance for integration of the LUE to wash the right arm, but was able to use the LUE to apply soap to hold the washcloth when applying soap with setup assist.  He needed overall min instructional cueing for sequencing the shower and was able to maintain sustained attention to the task without multiple cueing for re-direction.  He performed stand pivot transfer to the wheelchair from the tub bench in order to work on dressing at the sink.  He needed max instructional cueing for hemi  techniques with dressing, but was able to donn his pullover shirt with min assist as well as his brief and pants with mod assist.  Max assist was needed for donning his socks with mod assist for his zip up shoes.  Finished session with pt in the bed to rest before next session with call button and phone in reach and LUE supported on pillow with LPRAFO in place.    Session 2: (1300-1349) Pt completed supine to sit EOB with mod assist.  He then needed supervision for donning the right shoe with max assist for donning the left with AFO.  He then completed stand pivot transfer to the wheelchair.  Once in the wheelchair, he was taken down to the tub room where he was educated on tub transfers.  Pt reports that his family is working on converting the tub/shower into a walk-in shower as well.  Therapist discussed that this would be easier in the long run, but likely at first he will still need use of a tub bench in the walk in shower or the bath tub.  He was able to complete transfer with overall mod assist squat pivot.  Max assist was needed for lifting the LLE and LUE over the edge of the tub as well.  After completion of transfer, his mother showed up, so discussed needs for the tub bench at home as well as making sure the opening for the walk-in shower is wide enough to accommodate  a tub bench.  If not having a curtain would likely be the best option.  She was unable to participate in any hands on secondary to decreased time, but plans to come back tomorrow.  Pt was taken back to the room and left up in the wheelchair with call button and phone in reach and safety belt in place.    Therapy Documentation Precautions:  Precautions Precautions: Fall Precaution Comments: L UE/LE severe paresis with impaired sensation; impaired safety awareness Restrictions Weight Bearing Restrictions: No   Pain: Pain Assessment Pain Scale: Faces Pain Score: 0-No pain ADL: See Care Tool Section for some details of mobility  and selfcare  Therapy/Group: Individual Therapy  Arriona Prest OTR/L 01/03/2020, 12:17 PM

## 2020-01-03 NOTE — Progress Notes (Signed)
Physical Therapy Session Note  Patient Details  Name: Dennis Mercado MRN: [id number] Date of Birth: 06-27-1980  Today's Date: 01/03/2020 PT Individual Time: 1420-1505 PT Individual Time Calculation (min): 45 min   Short Term Goals: Week 1:  PT Short Term Goal 1 (Week 1): Pt will perform supine<>sit with mod assist PT Short Term Goal 1 - Progress (Week 1): Progressing toward goal PT Short Term Goal 2 (Week 1): Pt will perform bed<>chair transfer with max assist (not using lift equipment) PT Short Term Goal 2 - Progress (Week 1): Met PT Short Term Goal 3 (Week 1): Pt will initiate gait training PT Short Term Goal 3 - Progress (Week 1): Met PT Short Term Goal 4 (Week 1): Pt will participate in sitting balance assessment PT Short Term Goal 4 - Progress (Week 1): Progressing toward goal Week 2:  PT Short Term Goal 1 (Week 2): Pt will perform supine<>sit with mod assist PT Short Term Goal 1 - Progress (Week 2): Met PT Short Term Goal 2 (Week 2): Pt will perform sit<>stand with min assist PT Short Term Goal 2 - Progress (Week 2): Progressing toward goal PT Short Term Goal 3 (Week 2): Pt will perform squat pivot transfers with min assist PT Short Term Goal 3 - Progress (Week 2): Progressing toward goal Week 3:  PT Short Term Goal 1 (Week 3): Pt will perform supine<>sit with min assist PT Short Term Goal 2 (Week 3): Pt will perform sit<>stand with min assist PT Short Term Goal 3 (Week 3): Pt will perform bed<>chair transfers with min assist  Skilled Therapeutic Interventions/Progress Updates:    PAIN Pt denies pain Todays session w/focus on family training w/mother. Pt mother present and educated re operation of wc parts/tilt/armrests etc. Pt transported to gym and demonstrated wc to/from mat transfers w/mother observing.  Mother then performed set up and transfer w/cues for proper body mechanics/positioning of pt.  Therapist then transported pt to gym where car transfer was in session/pt and  mother observed.  Therapist then verbally instructed set up, body mechanics, transfer, and discussed safety related to sitting balance and performed car transfer w/pt.   Mother then performs transfer w/pt w/verbal cues for mechanics and set up and cues for safety when moving legs in and out of car/not attending to sitting balance and unaware of post tendency/therapist cued mother to attend to pt balance as well as head celarance w/transition.  Returned to room for training w/bed to/from wc, performs w/verbal cues as above.  Then addressed  sit to/from supine which pt performed w/min assist and cues to cross R/L   Pt left supine w/rails up x 4, alarm set, bed in lowest position, and needs in reach.  Therapy Documentation Precautions:  Precautions Precautions: Fall Precaution Comments: L UE/LE severe paresis with impaired sensation; impaired safety awareness Restrictions Weight Bearing Restrictions: No    Therapy/Group: Individual Therapy  Callie Fielding, [place] 01/03/2020, 4:23 PM

## 2020-01-03 NOTE — Progress Notes (Signed)
Speech Language Pathology Daily Session Note  Patient Details  Name: Dennis Mercado MRN: [id number] Date of Birth: [birth year]  Today's Date: 01/03/2020 SLP Individual Time: 4098-1191 SLP Individual Time Calculation (min): 27 min  Short Term Goals: Week 3: SLP Short Term Goal 1 (Week 3): Pt will demonstrate efficient mastication and oral clearance of regular texture solids with Supervision A verbal cues fo ruse of strategies across 2 sessions prior to upgrade. SLP Short Term Goal 2 (Week 3): Pt will demonstrate ability to problem solve basic situations with Mod A verbal/visual cues. SLP Short Term Goal 3 (Week 3): Pt will sustain attention to functional tasks for 3 minute intervals with Min A verbal/visual cues. SLP Short Term Goal 4 (Week 3): Pt will recall new and/or daily information with Mod A cues for use of strategies/aids. SLP Short Term Goal 5 (Week 3): Pt will detect functional errors with Mod A multimodal cues.  Skilled Therapeutic Interventions: Pt was seen for skilled ST targeting education with pt and his mother, who will be one of his caregivers. Discussed current diet recommendation textures (dys 3, thin) as well as compensatory strategy for clearance of left buccal pocketing. Also noted that pt may advance solid textures further prior to d/c, therefore ST will communicate any changes in diet closer to d/c. Also discussed pt's progress toward cognitive function with excellent improvements in memory since admission, and beginning to see improvement in pt's ability to sustain attention to tasks and detect functional errors. Discussed strategies to maximize comprehension and attention at home. All questions answered to pt and his mother's satisfaction. Pt left sitting in chair with alarm set and needs within reach, mother still present. Continue per current plan of care.          Pain Pain Assessment Pain Scale: 0-10 Pain Score: 0-No pain  Therapy/Group: Individual Therapy  Arbutus Leas 01/03/2020, 3:07 PM

## 2020-01-03 NOTE — Progress Notes (Signed)
Patient ID: Dennis Mercado, male   DOB: [birth year], 39 y.o.   MRN: [id number]   Sw followed up with patient, having lunch with staff at bedside. Patient has no questions or concerns

## 2020-01-04 ENCOUNTER — Encounter (HOSPITAL_COMMUNITY): Payer: BC Managed Care – PPO | Admitting: Speech Pathology

## 2020-01-04 ENCOUNTER — Encounter (HOSPITAL_COMMUNITY): Payer: BC Managed Care – PPO | Admitting: Occupational Therapy

## 2020-01-04 ENCOUNTER — Inpatient Hospital Stay (HOSPITAL_COMMUNITY): Payer: BC Managed Care – PPO | Admitting: Occupational Therapy

## 2020-01-04 ENCOUNTER — Ambulatory Visit (HOSPITAL_COMMUNITY): Payer: BC Managed Care – PPO | Admitting: Physical Therapy

## 2020-01-04 MED ORDER — MUSCLE RUB 10-15 % EX CREA
TOPICAL_CREAM | Freq: Two times a day (BID) | CUTANEOUS | Status: DC | PRN
  Administered 2020-01-10: 1 via TOPICAL
  Filled 2020-01-04: qty 85

## 2020-01-04 NOTE — Progress Notes (Signed)
[place] PHYSICAL MEDICINE & REHABILITATION PROGRESS NOTE   Subjective/Complaints:  Some RIght side neclk pain, states that he slept ok.  Has occ bladder accidents   ROS: Patient denies CP, SOB, N/V/D Objective:   No results found. No results for input(s): WBC, HGB, HCT, PLT in the last 72 hours. Recent Labs    01/03/20 1102  NA 141  K 4.5  CL 103  CO2 26  GLUCOSE 88  BUN 23*  CREATININE 1.33*  CALCIUM 9.9    Intake/Output Summary (Last 24 hours) at 01/04/2020 0757 Last data filed at 01/04/2020 0053 Gross per 24 hour  Intake 1139 ml  Output 375 ml  Net 764 ml     Physical Exam: Vital Signs Blood pressure 118/88, pulse 82, temperature 98.9 F (37.2 C), temperature source Oral, resp. rate 16, height 5\' 7"  (1.702 m), weight 71 kg, SpO2 99 %.  General: No acute distress Mood and affect are appropriate Heart: Regular rate and rhythm no rubs murmurs or extra sounds Lungs: Clear to auscultation, breathing unlabored, no rales or wheezes Abdomen: Positive bowel sounds, soft nontender to palpation, nondistended Extremities: No clubbing, cyanosis, or edema Skin: No evidence of breakdown, no evidence of rash MSK mild tenderness Right cervical paraspinals Motor: LUE/LLE: 0/5 proximal distal, senses pain LUE and LLE Increased tone noted in left upper extremity/left lower extremity Left wrist and ankle clonus ongoing.  Assessment/Plan: 1. 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  Physiatrist is providing close team supervision and 24 hour management of active medical problems listed below.  Physiatrist and rehab team continue to assess barriers to discharge/monitor patient progress toward functional and medical goals  Care Tool:  Bathing    Body parts bathed by patient: Left arm, Chest, Abdomen, Front perineal area, Buttocks, Right upper leg, Left upper  leg, Right lower leg, Left lower leg, Face   Body parts bathed by helper: Right arm     Bathing assist Assist Level: Moderate Assistance - Patient 50 - 74%     Upper Body Dressing/Undressing Upper body dressing Upper body dressing/undressing activity did not occur (including orthotics): N/A What is the patient wearing?: Pull over shirt    Upper body assist Assist Level: Moderate Assistance - Patient 50 - 74%    Lower Body Dressing/Undressing Lower body dressing      What is the patient wearing?: Pants, Incontinence brief     Lower body assist Assist for lower body dressing: Moderate Assistance - Patient 50 - 74%     Toileting Toileting    Toileting assist Assist for toileting: Set up assist Assistive Device Comment: urinal (assist placing urinal, pt will hold)   Transfers Chair/bed transfer  Transfers assist  Chair/bed transfer activity did not occur: N/A  Chair/bed transfer assist level: Moderate Assistance - Patient 50 - 74%     Locomotion Ambulation   Ambulation assist   Ambulation activity did not occur: Safety/medical concerns  Assist level: 2 helpers (mod/max A of 1 & min A of 2nd) Assistive device: Walker-rolling Max distance: 36ft   Walk 10 feet activity   Assist  Walk 10 feet activity did not occur: Safety/medical concerns  Assist level: 2 helpers (mod/max A of 1 & min A of 2nd) Assistive device: Walker-rolling   Walk 50 feet activity   Assist Walk 50 feet with 2 turns activity did not occur: Safety/medical concerns (not turning yet)  Assist level:  2 helpers Assistive device: Lite Gait    Walk 150 feet activity   Assist Walk 150 feet activity did not occur: Safety/medical concerns         Walk 10 feet on uneven surface  activity   Assist Walk 10 feet on uneven surfaces activity did not occur: Safety/medical concerns         Wheelchair     Assist Will patient use wheelchair at discharge?: No (No PT lomg term goals  set)             Wheelchair 50 feet with 2 turns activity    Assist            Wheelchair 150 feet activity     Assist          Blood pressure 118/88, pulse 82, temperature 98.9 F (37.2 C), temperature source Oral, resp. rate 16, height 5\' 7"  (1.702 m), weight 71 kg, SpO2 99 %.  Medical Problem List and Plan: 1.  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  Antithrombotics: -DVT/anticoagulation: Subcutaneous heparin initiated 12/11/2019,dopplers neg              -antiplatelet therapy: N/A 3. Pain Management: Tylenol as needed,    Topirimate 25mg  BID, DC'd due to confusion  Appears controlled on 7/18  4. Mood: Provide emotional support             -antipsychotic agents: N/A 5. Neuropsych: This patient is not fully capable of making complex decisions on his own behalf. 6. Skin/Wound Care: Routine skin checks 7. Fluids/Electrolytes/Nutrition: Routine in and outs. 8.  Post stroke dysphagia.  Dysphagia #3 thins liquids.  Follow-up speech therapy.  Advance diet as tolerated 9.  Left lung pneumonia.  Intravenous Unasyn completed 10.  Hypertension.  Norvasc 10 mg daily, Coreg 25 mg twice daily, clonidine 0.3 mg twice daily  Hydralazine 100 mg every 8 hours, d/ced on 7/10  Lisinopril 10 mg twice daily, decreased to 5 twice daily on 7/11, decreased to daily on 7/14.   Vitals:   01/03/20 1946 01/04/20 0502  BP: (!) 129/97 118/88  Pulse: 76 82  Resp:    Temp: 99 F (37.2 C) 98.9 F (37.2 C)  SpO2: 100% 99%   7/19 controlled but a little soft will reduce clonidine to 0.2mg  - cont current dose               11.  Urine drug screen positive marijuana.  Counseled on appropriate 12.  Pre renal azotemia worsening:  Creatinine 1.62 on 7/14, labs ordered for  Monday  Continue to encourage hydration 13.  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  This will likely occur more often at night.  Monitor for signs of alcohol withdrawal although drinking pattern was mainly on the weekends. 14.  Sleep disturbance   Trial Seroquel increased to 50 mg nightly with improvement 15.  Leukocytosis: Resolved   WBCs 10.4 on 7/12 16.  Transaminitis: Resolved 17.  Hyponatremia- resolved  18.  Normochomic anemia  Hemoglobin 12.3 on 7/14  LOS: 20 days A FACE TO Mead E Sakai Heinle 01/04/2020, 7:57 AM

## 2020-01-04 NOTE — Progress Notes (Signed)
Speech Language Pathology Daily Session Note  Patient Details  Name: Dennis Mercado MRN: [id number] Date of Birth: [birth year]  Today's Date: 01/04/2020 SLP Individual Time: 5929-2446 SLP Individual Time Calculation (min): 40 min  Short Term Goals: Week 3: SLP Short Term Goal 1 (Week 3): Pt will demonstrate efficient mastication and oral clearance of regular texture solids with Supervision A verbal cues fo ruse of strategies across 2 sessions prior to upgrade. SLP Short Term Goal 2 (Week 3): Pt will demonstrate ability to problem solve basic situations with Mod A verbal/visual cues. SLP Short Term Goal 3 (Week 3): Pt will sustain attention to functional tasks for 3 minute intervals with Min A verbal/visual cues. SLP Short Term Goal 4 (Week 3): Pt will recall new and/or daily information with Mod A cues for use of strategies/aids. SLP Short Term Goal 5 (Week 3): Pt will detect functional errors with Mod A multimodal cues.  Skilled Therapeutic Interventions: Skilled treatment session focused on dysphagia and cognitive goals. SLP facilitated session by providing skilled observation with trials of regular textures. Patient demonstrated efficient mastication with complete oral clearance without overt s/s of aspiration. Recommend trial tray prior to upgrade. Patient's wife present and educated on patient's current swallowing function, swallowing compensatory strategies and medication administration. She verbalized understanding of information.  SLP also facilitated session by providing extra time and Mod A verbal and visual cues for basic problem solving during a money management task. SLP provided education to the patient's wife regarding appropriate cues and activities they can perform safely at home to maximize cognitive functioning. She verbalized understanding. Patient left upright in the wheelchair with wife present. Continue with current plan of care.      Pain Pain Assessment Pain Scale:  Faces Pain Score: 0-No pain  Therapy/Group: Individual Therapy  Margie Urbanowicz 01/04/2020, 3:40 PM

## 2020-01-04 NOTE — Progress Notes (Addendum)
Occupational Therapy Session Note  Patient Details  Name: Dennis Mercado MRN: [id number] Date of Birth: October 11, 1980  Today's Date: 01/04/2020 OT Individual Time: 6237-6283 OT Individual Time Calculation (min): 52 min    Short Term Goals: Week 3:  OT Short Term Goal 1 (Week 3): Pt will complete UB dressing with min assist following hemi dressing techniques. OT Short Term Goal 2 (Week 3): Pt will donn pants and brief with mod assist following hemi techniques for two consecutive sessions. OT Short Term Goal 3 (Week 3): Pt will complete sit to stand with min assist during LB selfcare tasks. OT Short Term Goal 4 (Week 3): Pt will complete toilet transfer with min assist to the 3:1 squat or stand pivot OT Short Term Goal 5 (Week 3): Pt will sequence through bathing tasks with no more than mod instructional cueing.  Skilled Therapeutic Interventions/Progress Updates:    Session 1: [phone])  Pt in bed to start session, agreeable to working on OT.  He was able to transfer from supine to sit EOB with mod instructional cueing and mod assist.  Therapist assisted with donning TEDs and then he was able to donn his right shoe sitting EOB.  Therapist then provided total assist for donning the left shoe and AFO.  Pt completed stand pivot transfer to the wheelchair with mod assist and was then transported over to the sink for completion of grooming tasks.  He was able to comb his hair with supervision and then completed oral hygiene with setup assist.  Therapist assisted with integration of the LUE as a stabilizer with max assist to hold the toothbrush for applying toothpaste.  Discussed other options or methods for completion of this one handed by applying the toothpaste to his mouth instead of the toothbrush.  Next, therapist took him down to the therapy gym where he transferred to the therapy mat and worked on functional reaching and weightbearing through the LUE and LLE.  Had him reach for bean bags with the  RUE and place in container place to the left with increased weightbearing encouraged through the LUE and LLE.  He needed mod assist to complete reaching and maintaining squat position for several 10 second intervals.  Finished session with return to the room and pt left sitting up in the wheelchair with call button and phone in reach.  Saebo One Stim applied to the left shoulder for treatment of subluxation.  Intensity on 8 clicks for 60 mins without any redness, pain, or adverse reactions.  See below for device settings. Saebo Stim One 330 pulse width 35 Hz pulse rate On 8 sec/ off 8 sec Ramp up/ down 2 sec Symmetrical Biphasic wave form  Max intensity 149mA at 500 Ohm load   Session 2: (7371-0626)  Pt up in the wheelchair to start session with his spouse present.  Took pt down to the ADL apartment where he practiced transfers to the simulated walk-in shower with use of the tub bench. Therapist demonstrated transfer first with pt at min assist level squat pivot to the bench and mod assist for lifting the LLE over the edge of the shower as well as out of the shower when transferring out.  His wife was able to return demonstrate completion of transfer as well.  Discussed setup of home bathroom and that he will likely have to transfer from the wheelchair over to the drop arm commode on the toilet and then over to the tub bench as the wheelchair cannot be positioned for  completion of a 90 degree transfer.  Therapist discussed use of NMES on the left shoulder and provided visual demonstration of use in case they are interested in purchasing from outside company.  Also discussed dressing techniques and techniques for sit to stand with LB selfcare.  Will complete ADL session with spouse next visit.  Attempted to donn Mohr Sling for the LUE, but still could not get it to stay in place when pt would stand.  Called orthotist and they will come and look at it again tomorrow.  Pt was left in the wheelchair with the  call button and phone in reach with safety belt in place.    Therapy Documentation Precautions:  Precautions Precautions: Fall Precaution Comments: L UE/LE severe paresis with impaired sensation; impaired safety awareness Restrictions Weight Bearing Restrictions: No   Pain: Pain Assessment Pain Scale: Faces Pain Score: 0-No pain Faces Pain Scale: No hurt ADL: See Care Tool Section for some details of mobility and selfcare  Therapy/Group: Individual Therapy  Senon Nixon OTR/L 01/04/2020, 12:26 PM

## 2020-01-04 NOTE — Progress Notes (Signed)
Physical Therapy Session Note  Patient Details  Name: Dennis Mercado MRN: [id number] Date of Birth: [birth year]  Today's Date: 01/04/2020 PT Individual Time: 0258-5277 PT Individual Time Calculation (min): 40 min   Short Term Goals: Week 3:  PT Short Term Goal 1 (Week 3): Pt will perform supine<>sit with min assist PT Short Term Goal 2 (Week 3): Pt will perform sit<>stand with min assist PT Short Term Goal 3 (Week 3): Pt will perform bed<>chair transfers with min assist  Skilled Therapeutic Interventions/Progress Updates:    Pt received supine in bed with his wife present and pt agreeable to therapy session. [name] present for hands-on family education/training.Therapist educated pt/family on proper L hemibody positioning in bed for pressure relief, elevation, and to promote maintained full PROM as well as safe L hemibody management during mobility tasks. Therapist educated pt's wife on recommendation for light weight wheelchair with contouring back and pressure relieving cushion to promote upright posture and improved pressure relief to increase upright, OOB activity tolerance upon discharge. Therapist educated on pt's current impairments including L inattention and impaired visual/perceptual awareness. Therapist educated and demonstrated proper assistance with supine<>sit on flat bed without bedrails - pt requires min assist primarily for L hemibody management. Pt/wife demonstrated understanding. Educated on squat pivot transfers to/from w/c including proper set-up and positioning to assist pt safely including use of gait belt and wearing tennis shoes. Pt/wife performed block practice squat pivot transfers w/c<>EOB x3 with min cuing from therapist.  Transported to/from gym in w/c for time management and energy conservation. Therapist retrieved [phone] wheelchair with contouring back and Roho cushion to trial as opposed to the tilt in space w/c pt has been using - educated pt's wife on w/c parts. Pt's  wife properly set-up and assisted pt with squat pivot transfers TIS w/c>EOM>w/c with min cuing from therapist. Therapist educated on proper set-up of car transfer - pt/wife have [employer] or [employer] - planning to perform real life car transfer on Thursday 7/22 to [employer]. Today performed simulated squat pivot car transfer with pt's wife properly setting up and assisting with transfer with min cuing. [name] demonstrates great awareness and positioning to assist pt safely during all functional mobility tasks as well as great assistance/management of L hemibody. Pt left seated in w/c with his wife present as hand-off to [place], [place].   Therapy Documentation Precautions:  Precautions Precautions: Fall Precaution Comments: L UE/LE severe paresis with impaired sensation; impaired safety awareness Restrictions Weight Bearing Restrictions: No  Pain: No reports of pain throughout session.  Therapy/Group: Individual Therapy  Tawana Scale , PT, DPT, CSRS  01/04/2020, 12:20 PM

## 2020-01-05 ENCOUNTER — Inpatient Hospital Stay (HOSPITAL_COMMUNITY): Payer: BC Managed Care – PPO | Admitting: Speech Pathology

## 2020-01-05 ENCOUNTER — Inpatient Hospital Stay (HOSPITAL_COMMUNITY): Payer: BC Managed Care – PPO | Admitting: Occupational Therapy

## 2020-01-05 ENCOUNTER — Inpatient Hospital Stay (HOSPITAL_COMMUNITY): Payer: BC Managed Care – PPO | Admitting: Physical Therapy

## 2020-01-05 NOTE — Progress Notes (Signed)
Speech Language Pathology Daily Session Note  Patient Details  Name: Dennis Mercado MRN: [id number] Date of Birth: 04/03/1981  Today's Date: 01/05/2020 SLP Individual Time: 1300-1345 SLP Individual Time Calculation (min): 45 min  Short Term Goals: Week 3: SLP Short Term Goal 1 (Week 3): Pt will demonstrate efficient mastication and oral clearance of regular texture solids with Supervision A verbal cues fo ruse of strategies across 2 sessions prior to upgrade. SLP Short Term Goal 2 (Week 3): Pt will demonstrate ability to problem solve basic situations with Mod A verbal/visual cues. SLP Short Term Goal 3 (Week 3): Pt will sustain attention to functional tasks for 3 minute intervals with Min A verbal/visual cues. SLP Short Term Goal 4 (Week 3): Pt will recall new and/or daily information with Mod A cues for use of strategies/aids. SLP Short Term Goal 5 (Week 3): Pt will detect functional errors with Mod A multimodal cues.  Skilled Therapeutic Interventions: Pt was seen for skilled ST targeting cognitive goals. SLP facilitated session with review and listing making of pt's current medications - although he reported recognizing names he required Total A for learning their functions but later used list as aid to track and recall meds with Min A verbal/visual cues. Pt then organized a BID pill box according to his list with Supervision A verbal cues for  problem solving and error awareness. Discussing having supervision level assistance with medication administration at home, pt in agreement. Pt left sitting in chair with alarm set and needs within reach. Continue per current plan of care.       Pain Pain Assessment Pain Scale: 0-10 Pain Score: 0-No pain  Therapy/Group: Individual Therapy  Arbutus Leas 01/05/2020, 2:37 PM

## 2020-01-05 NOTE — Patient Care Conference (Signed)
Inpatient RehabilitationTeam Conference and Plan of Care Update Date: 01/05/2020   Time: 2:17 PM    Patient Name: Dennis Mercado      Medical Record Number: [id number]  Date of Birth: [birth year] Sex: Male         Room/Bed: 4W06C/4W06C-01 Payor Info: Payor: [place] / Plan: BCBS COMM PPO / Product Type: *No Product type* /    Admit Date/Time:  12/15/2019  4:59 PM  Primary Diagnosis:  ICH (intracerebral hemorrhage) [facility])  Hospital Problems: Principal Problem:   ICH (intracerebral hemorrhage) (HCC) Active Problems:   Prerenal azotemia   Sleep disturbance   Leukocytosis   Transaminitis   Hyponatremia   Acute blood loss anemia   Hypotension due to drugs   Vascular headache   Benign essential HTN    Expected Discharge Date: Expected Discharge Date: 01/12/20  Team Members Present: Physician leading conference: Dr. Alysia Penna Care Coodinator Present: Dorien Chihuahua, RN, BSN, CRRN;Christina Sampson Goon, BSW Nurse Present: Ellison Carwin, LPN PT Present: Page Spiro, PT OT Present: Clyda Greener, OT SLP Present: Jettie Booze, CF-SLP PPS Coordinator present : Gunnar Fusi, SLP     Current Status/Progress Goal Weekly Team Focus  Bowel/Bladder   patient is incontinent/continent with bowel and bladder. LBM 01/04/20  Regain full continence of B&B.  Q2-3 hours toileting and PRN to decrease incontinence   Swallow/Nutrition/ Hydration   Dys 3, thin, Supervision A  Supervision A  trials regular textures, continue to work toward independence with swallow strategies for oral clearance, education   ADL's   Min assist for bathing sit to stand with mod assist for most dressing except for TEDs and left shoe with AFO.  Increased sustained attention this week with increased carryover.  Still with Brunnstrum stage II in the left shoulder with trace movements only and Brunnstrum stage I in the hand.  Mod assist for stand or squat pivot transfers to the toilet or the tub bench.   min assist  selfcare retraining, transfer training, balance retraining, neuromuscular re-education, NMES, splinting, DME education therapeutic exercise   Mobility   min/mod assist supine<>sit, mod progressing towards min assist squat pivot transfers, gait training up to 55ft using RW with mod assist of 1 and +2 min assist  - continues to demo L inattention and impaired visual/perceptual awareness of midline and his body in space  Min Assist overall  transfer training, L attention, gait training, bed moblity, midline orientation, L LE NMR, pt/family education   Communication             Safety/Cognition/ Behavioral Observations  Good improvements this week in regard to attention, awareness, basic problem solving, Min-Mod  Supervision  functional problem solving, sustained attention, visual scanning, emergent awareness   Pain   patient denies pain and discomfort  pain is <3 with or without activity  Q shift and PRN pain assessment   Skin   patient has no skin issues  Maintain good skin integrity.  Assess ski q shift and PRN     Team Discussion:  Discharge Planning/Teaching Needs:  Goal to discharge home with spouse and family to provide care  E[place]ion scheduled with spouse and mother   Current Update: on target  Current Barriers to Discharge:  None  Possible Resolutions to Barriers:   Patient on target to meet rehab goals: yes, Little motor recovery and trace movement of left shoulder however impaired sensory deficits continue. Note improvement in awareness, sustained attention, left inattention and attending better to tasks.  *See Care  Plan and progress notes for long and short-term goals.   Revisions to Treatment Plan:  Continue to work on awareness deficits and perception of body with OT/PT and review strategies for oral clearance and problem solving with SLP    Medical Summary Current Status: Remains incont, skin OK Weekly Focus/Goal: Family training  Barriers to  Discharge: Other (comments)  Barriers to Discharge Comments: left inattention Possible Resolutions to Barriers: e stim to finger flexors/extensors, work on attention   Continued Need for Acute Rehabilitation Level of Care: The patient requires daily medical management by a physician with specialized training in physical medicine and rehabilitation for the following reasons: Direction of a multidisciplinary physical rehabilitation program to maximize functional independence : Yes Medical management of patient stability for increased activity during participation in an intensive rehabilitation regime.: Yes Analysis of laboratory values and/or radiology reports with any subsequent need for medication adjustment and/or medical intervention. : Yes   I attest that I was present, lead the team conference, and concur with the assessment and plan of the team.   Dorien Chihuahua B 01/05/2020, 2:17 PM

## 2020-01-05 NOTE — Progress Notes (Signed)
Patient ID: Dennis Mercado, male   DOB: 1981-03-16, 39 y.o.   MRN: [id number]  Team Conference Report to Patient/Family  Team Conference discussion was reviewed with the patient and caregiver, including goals, any changes in plan of care and target discharge date.  Patient and caregiver express understanding and are in agreement.  The patient has a target discharge date of 01/12/20.  Dyanne Iha 01/05/2020, 1:39 PM

## 2020-01-05 NOTE — Progress Notes (Signed)
Patient hasn't voided in 8 hours. This Probation officer and NT has toileted and offered urinal multiple times during shift. No void noted. This Probation officer informed patient he was due to be I/O cath since he hasn't voided. Patient refused multiples times, stating "he feels like he is going to urinate soon." This writer explained the risk of not urinating and having a full bladder. Patent understands education provided. Adria Devon, LPN

## 2020-01-05 NOTE — Progress Notes (Signed)
[place] PHYSICAL MEDICINE & REHABILITATION PROGRESS NOTE   Subjective/Complaints:  No issues overnight although he does not feel he slept well.  Patient oriented to person place and time.  Aware of discharge date  ROS: Patient denies CP, SOB, N/V/D Objective:   No results found. No results for input(s): WBC, HGB, HCT, PLT in the last 72 hours. Recent Labs    01/03/20 1102  NA 141  K 4.5  CL 103  CO2 26  GLUCOSE 88  BUN 23*  CREATININE 1.33*  CALCIUM 9.9    Intake/Output Summary (Last 24 hours) at 01/05/2020 0950 Last data filed at 01/05/2020 0814 Gross per 24 hour  Intake 662 ml  Output 550 ml  Net 112 ml     Physical Exam: Vital Signs Blood pressure 116/86, pulse 73, temperature 99.5 F (37.5 C), temperature source Oral, resp. rate 16, height 5' 7"  (1.702 m), weight 71 kg, SpO2 99 %.  General: No acute distress Mood and affect are appropriate Heart: Regular rate and rhythm no rubs murmurs or extra sounds Lungs: Clear to auscultation, breathing unlabored, no rales or wheezes Abdomen: Positive bowel sounds, soft nontender to palpation, nondistended Extremities: No clubbing, cyanosis, or edema Skin: No evidence of breakdown, no evidence of rash  Motor: LUE/LLE: 0/5 proximal distal, senses pain LUE and LLE Increased tone noted in left upper extremity/left lower extremity Left wrist and ankle clonus ongoing.  Assessment/Plan: 1. 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  Physiatrist is providing close team supervision and 24 hour management of active medical problems listed below.  Physiatrist and rehab team continue to assess barriers to discharge/monitor patient progress toward functional and medical goals  Care Tool:  Bathing    Body parts bathed by patient: Left arm, Chest, Abdomen, Front perineal area, Buttocks, Right upper leg, Left upper  leg, Right lower leg, Left lower leg, Face   Body parts bathed by helper: Right arm     Bathing assist Assist Level: Moderate Assistance - Patient 50 - 74%     Upper Body Dressing/Undressing Upper body dressing Upper body dressing/undressing activity did not occur (including orthotics): N/A What is the patient wearing?: Pull over shirt    Upper body assist Assist Level: Moderate Assistance - Patient 50 - 74%    Lower Body Dressing/Undressing Lower body dressing      What is the patient wearing?: Pants, Incontinence brief     Lower body assist Assist for lower body dressing: Moderate Assistance - Patient 50 - 74%     Toileting Toileting    Toileting assist Assist for toileting: Set up assist Assistive Device Comment: urinal (assist placing urinal, pt will hold)   Transfers Chair/bed transfer  Transfers assist  Chair/bed transfer activity did not occur: N/A  Chair/bed transfer assist level: Moderate Assistance - Patient 50 - 74% (squat pivot)     Locomotion Ambulation   Ambulation assist   Ambulation activity did not occur: Safety/medical concerns  Assist level: 2 helpers (mod/max A of 1 & min A of 2nd) Assistive device: Walker-rolling Max distance: 56f   Walk 10 feet activity   Assist  Walk 10 feet activity did not occur: Safety/medical concerns  Assist level: 2 helpers (mod/max A of 1 & min A of 2nd) Assistive device: Walker-rolling   Walk 50 feet activity   Assist Walk 50 feet with 2 turns activity did not occur: Safety/medical concerns (not  turning yet)  Assist level: 2 helpers Assistive device: Lite Gait    Walk 150 feet activity   Assist Walk 150 feet activity did not occur: Safety/medical concerns         Walk 10 feet on uneven surface  activity   Assist Walk 10 feet on uneven surfaces activity did not occur: Safety/medical concerns         Wheelchair     Assist Will patient use wheelchair at discharge?: No (No PT lomg  term goals set)             Wheelchair 50 feet with 2 turns activity    Assist            Wheelchair 150 feet activity     Assist          Blood pressure 116/86, pulse 73, temperature 99.5 F (37.5 C), temperature source Oral, resp. rate 16, height 5' 7"  (1.702 m), weight 71 kg, SpO2 99 %.  Medical Problem List and Plan: 1.  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 Formulized individual treatment plan based on medical history, underlying problem and comorbidities.  2.  Antithrombotics: -DVT/anticoagulation: Subcutaneous heparin initiated 12/11/2019,dopplers neg              -antiplatelet therapy: N/A 3. Pain Management: Tylenol as needed,    Topirimate 59m BID, DC'd due to confusion  Appears controlled on 7/18  4. Mood: Provide emotional support             -antipsychotic agents: N/A 5. Neuropsych: This patient is not fully capable of making complex decisions on his own behalf. 6. Skin/Wound Care: Routine skin checks 7. Fluids/Electrolytes/Nutrition: Routine in and outs. 8.  Post stroke dysphagia.  Dysphagia #3 thins liquids.  Follow-up speech therapy.  Advance diet as tolerated 9.  Left lung pneumonia.  Intravenous Unasyn completed 10.  Hypertension.  Norvasc 10 mg daily, Coreg 25 mg twice daily, clonidine 0.3 mg twice daily  Hydralazine 100 mg every 8 hours, d/ced on 7/10  Lisinopril 10 mg twice daily, decreased to 5 twice daily on 7/11, decreased to daily on 7/14.   Vitals:   01/04/20 2008 01/05/20 0504  BP: 111/79 116/86  Pulse: 77 73  Resp:  16  Temp: 99.4 F (37.4 C) 99.5 F (37.5 C)  SpO2: 100% 99%   7/19 controlled  but a little soft will reduce clonidine to 0.249m- cont current dose               11.  Urine drug screen positive marijuana.  Counseled on appropriate 12.  Pre renal azotemia worsening:  Creatinine 1.62 on 7/14, improved to 1.33 on 7/19  Continue to encourage hydration 13.  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  This will likely occur more often at night.  Monitor for signs of alcohol withdrawal although drinking pattern was mainly on the weekends. 14.  Sleep disturbance   Trial Seroquel increased to 50 mg nightly with improvement 15.  Leukocytosis: Resolved   WBCs 10.4 on 7/12 16.  Transaminitis: Resolved 17.  Hyponatremia- resolved  18.  Normochomic anemia  Hemoglobin 12.3 on 7/14  LOS: 21 days A FACE TO  FACE EVALUATION WAS PERFORMED  [name] 01/05/2020, 9:50 AM

## 2020-01-05 NOTE — Plan of Care (Signed)
°  Problem: RH SKIN INTEGRITY Goal: RH STG ABLE TO PERFORM INCISION/WOUND CARE W/ASSISTANCE Description: STG Able To Perform Incision/Wound Care With Assistance. Outcome: Progressing   Problem: RH SAFETY Goal: RH STG DECREASED RISK OF FALL WITH ASSISTANCE Description: STG Decreased Risk of Fall With Assistance. Outcome: Progressing   Problem: RH KNOWLEDGE DEFICIT Goal: RH STG INCREASE KNOWLEDGE OF HYPERTENSION Outcome: Progressing

## 2020-01-05 NOTE — Progress Notes (Addendum)
Occupational Therapy Weekly Progress Note  Patient Details  Name: Dennis Mercado MRN: [id number] Date of Birth: Dec 27, 1980  Beginning of progress report period: December 30, 2019 End of progress report period: January 05, 2020  Today's Date: 01/05/2020 OT Individual Time: 6948-5462 OT Individual Time Calculation (min): 56 min    Patient has met 4 of 5 short term goals.  [name] is making steady progress with OT at this time.  He is able to complete UB bathing at min assist level with LB bathing at min to mod in the shower.  He complete UB dressing with min assist following hemi techniques with mod instructional cueing.  He needs mod assist for donning brief and pants as well as supervision for the left shoe.  Total assist is needed for TEDs and the left AFO.  He continues to need mod assist for stand pivot transfers to the drop arm commode and toilet with min to mod for squat pivot.  He continues to need mod assist for clothing management for toileting sit to stand.  LUE function is still at [facility] stage II level in the left arm with stage I in the hand.  Slight tone is noted in the left pectoral, which fluctuates, as well as in the digit flexors.  He needs max hand over hand for integration into bathing tasks as a gross assist.  Family education has been initiated with more planned over the next week.  Feel he is on target to meet overall min assist level goals for discharge.  Continue with current OT POC with discharge expected on 7/28 with assist from spouse and his mother  Patient continues to demonstrate the following deficits: muscle weakness, impaired timing and sequencing, abnormal tone, unbalanced muscle activation and decreased coordination, decreased attention to left, decreased awareness, decreased problem solving, decreased safety awareness and decreased memory and decreased standing balance, hemiplegia and decreased balance strategies and therefore will continue to benefit from skilled OT  intervention to enhance overall performance with BADL and Reduce care partner burden.  Patient progressing toward long term goals..  Continue plan of care.  OT Short Term Goals Week 4:  OT Short Term Goal 1 (Week 4): Continue working on established LTGs set at min assist overall.  Skilled Therapeutic Interventions/Progress Updates:    Session 1: (7035-0093)  Pt in bed to start session, had him transfer to the EOB with mod assist.  He then transitioned to the wheelchair with mod assist stand pivot.  Pt requested to work on trimming his beard at the sink to start.  He used the RUE for task with setup of supplies and min assist for thoroughness secondary to slight inattention to trimming the left side of his face.  Once complete he performed stand pivot transfer to the shower with mod assist from the wheelchair and worked on bathing sit to stand.  He completed all bathing at min assist level with hand over hand assist needed for washing the right arm using the LUE.  He was able to sequence through the task with only min instructional cueing.  Transferred out to the wheelchair at mod assist stand pivot for work on dressing at the sink.  He was able to complete UB dressing and LB dressing with mod assist for pullover shirt, brief, and pants.  Therapist assisted with donning gripper socks secondary to decreased time.  He finished session with transfer back to the bed at mod assist to rest in preparation for upcoming PT session.  Bed alarm  in place with call button and phone in reach and LUE positioned on pillows.    Session 2: [phone])  Pt in wheelchair to start session with therapist transporting him down to the gym.  He completed mod assist transfers to and from the therapy mat stand pivot.  While on the mat had him focus on maintaining upright posture with anterior pelvic tilt while therapist performed gentle PROM external rotation of the left arm as well as for digit extension in preparation for active  movement.  His LUE was then placed on a tilted stool where he worked on simple movements of shoulder flexion with elbow extension.  A target was placed for him to push the stool to.  Overall, he was able to complete several repetitions with mod assist to begin progressing to only min guard after he was able to integrate some active movement.  Emphasis on maintaining upright midline orientation of trunk while keeping visual attention on the left arm and hand.  Finished session with transfer back to the wheelchair and placement of the Saebo One Stimulation device on the left shoulder for treatment of subluxation.  Intensity was set at 8 clicks with the parameters listed below.  He was able to tolerate one hour of stimulation without any reports of pain or discomfort or noted redness.  Pt was transferred back to the room and then to the bed per his request with mod assist.  He was left with the call button and phone in reach with safety belt in place.  Sa[place]o Stim One 330 pulse width 35 Hz pulse rate On 8 sec/ off 8 sec Ramp up/ down 2 sec Symmetrical Biphasic wave form  Max intensity 126m at 500 Ohm load   Therapy Documentation Precautions:  Precautions Precautions: Fall Precaution Comments: L UE/LE severe paresis with impaired sensation; impaired safety awareness Restrictions Weight Bearing Restrictions: No   Pain: Pain Assessment Pain Scale: Faces Pain Score: 0-No pain ADL: See Care Tool Section for some details of mobility and selfcare  Therapy/Group: Individual Therapy  Rosita Guzzetta OTR/L 01/05/2020, 10:55 AM

## 2020-01-05 NOTE — Progress Notes (Signed)
Physical Therapy Session Note  Patient Details  Name: Dennis Mercado MRN: [id number] Date of Birth: February 24, 1981  Today's Date: 01/05/2020 PT Individual Time: 1133-1203 PT Individual Time Calculation (min): 30 min   Short Term Goals: Week 3:  PT Short Term Goal 1 (Week 3): Pt will perform supine<>sit with min assist PT Short Term Goal 2 (Week 3): Pt will perform sit<>stand with min assist PT Short Term Goal 3 (Week 3): Pt will perform bed<>chair transfers with min assist  Skilled Therapeutic Interventions/Progress Updates:    Pt received asleep, supine in bed and awakens to verbal stimulus - agreeable to therapy session. Supine>sitting R EOB min assist for L hemibody management. Sitting EOB using R UE support for trunk control donned tennis shoes and L LE Sprystep Max GRAFO max assist. L squat pivot EOB>w/c min assist for pivoting hips and L LE management.  Transported to/from gym in w/c for time management and energy conservation. Gait training ~7ft, ~16ft using RW (with L walker hand splint) wearing L LE GRAFO and L shoe toe cover to promote increased knee extension in stance and decreased friction in swing, respectively.  - requires mod assist of 1 for L LE management and balance with +2 min assist for AD management  - if facilitate L heel strike on initial contact pt has improved glute/quad activation and improved stance control requiring mod assist to prevent knee buckle - pt able to initiate L swing phase with a few instances of increased hip/knee flexion muscle activation but continues to require max/total assist for advancement and positioning prior to initiating stance Transported back to room and left seated in w/c with needs in reach, seat belt alarm on, and L UE supported on pillows.   Therapy Documentation Precautions:  Precautions Precautions: Fall Precaution Comments: L UE/LE severe paresis with impaired sensation; impaired safety awareness Restrictions Weight Bearing  Restrictions: No  Pain: Reports L LE nerve pain - able to reposition therapist's hand placement for LE assistance during gait for pain management.   Therapy/Group: Individual Therapy  Tawana Scale , PT, DPT, CSRS  01/05/2020, 10:48 AM

## 2020-01-06 ENCOUNTER — Inpatient Hospital Stay (HOSPITAL_COMMUNITY): Payer: BC Managed Care – PPO | Admitting: Occupational Therapy

## 2020-01-06 ENCOUNTER — Ambulatory Visit (HOSPITAL_COMMUNITY): Payer: BC Managed Care – PPO | Admitting: Physical Therapy

## 2020-01-06 ENCOUNTER — Encounter (HOSPITAL_COMMUNITY): Payer: BC Managed Care – PPO | Admitting: Occupational Therapy

## 2020-01-06 ENCOUNTER — Inpatient Hospital Stay (HOSPITAL_COMMUNITY): Payer: BC Managed Care – PPO | Admitting: Speech Pathology

## 2020-01-06 MED ORDER — CLONIDINE HCL 0.1 MG PO TABS
0.1000 mg | ORAL_TABLET | Freq: Two times a day (BID) | ORAL | Status: DC
  Administered 2020-01-06 – 2020-01-12 (×12): 0.1 mg via ORAL
  Filled 2020-01-06 (×12): qty 1

## 2020-01-06 NOTE — Progress Notes (Signed)
[facility] PHYSICAL MEDICINE & REHABILITATION PROGRESS NOTE   Subjective/Complaints:  Pt states he voided >484ml this am , had recorded void but no volume recorded , pt has difficulty using urinal in bed   ROS: Patient denies CP, SOB, N/V/D Objective:   No results found. No results for input(s): WBC, HGB, HCT, PLT in the last 72 hours. Recent Labs    01/03/20 1102  NA 141  K 4.5  CL 103  CO2 26  GLUCOSE 88  BUN 23*  CREATININE 1.33*  CALCIUM 9.9    Intake/Output Summary (Last 24 hours) at 01/06/2020 0815 Last data filed at 01/06/2020 0309 Gross per 24 hour  Intake 320 ml  Output 725 ml  Net -405 ml     Physical Exam: Vital Signs Blood pressure 94/72, pulse 72, temperature 98.5 F (36.9 C), resp. rate 18, height 5\' 7"  (1.702 m), weight 71 kg, SpO2 99 %.   General: No acute distress Mood and affect are appropriate Heart: Regular rate and rhythm no rubs murmurs or extra sounds Lungs: Clear to auscultation, breathing unlabored, no rales or wheezes Abdomen: Positive bowel sounds, soft nontender to palpation, nondistended Extremities: No clubbing, cyanosis, or edema Skin: No evidence of breakdown, no evidence of rash  Motor: LUE/LLE: 0/5 proximal distal, senses pain LUE and LLE Increased tone noted in left upper extremity/left lower extremity Left wrist and ankle clonus ongoing.  Assessment/Plan: 1. 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  Physiatrist is providing close team supervision and 24 hour management of active medical problems listed below.  Physiatrist and rehab team continue to assess barriers to discharge/monitor patient progress toward functional and medical goals  Care Tool:  Bathing    Body parts bathed by patient: Left arm, Chest, Abdomen, Front perineal area, Buttocks, Right upper leg, Left upper leg, Right lower leg, Left lower  leg, Face   Body parts bathed by helper: Right arm     Bathing assist Assist Level: Moderate Assistance - Patient 50 - 74%     Upper Body Dressing/Undressing Upper body dressing Upper body dressing/undressing activity did not occur (including orthotics): N/A What is the patient wearing?: Pull over shirt    Upper body assist Assist Level: Moderate Assistance - Patient 50 - 74%    Lower Body Dressing/Undressing Lower body dressing      What is the patient wearing?: Pants, Incontinence brief     Lower body assist Assist for lower body dressing: Moderate Assistance - Patient 50 - 74%     Toileting Toileting    Toileting assist Assist for toileting: Set up assist Assistive Device Comment: urinal (assist placing urinal, pt will hold)   Transfers Chair/bed transfer  Transfers assist  Chair/bed transfer activity did not occur: N/A  Chair/bed transfer assist level: Minimal Assistance - Patient > 75% (squat pivot)     Locomotion Ambulation   Ambulation assist   Ambulation activity did not occur: Safety/medical concerns  Assist level: 2 helpers (mod A of 1 and +2 min assist) Assistive device: Walker-rolling Max distance: 31ft   Walk 10 feet activity   Assist  Walk 10 feet activity did not occur: Safety/medical concerns  Assist level: 2 helpers (mod A of 1 and +2 min assist) Assistive device: Walker-rolling   Walk 50 feet activity   Assist Walk 50 feet with 2 turns activity did not occur: Safety/medical concerns (not turning yet)  Assist level:  2 helpers Assistive device: Lite Gait    Walk 150 feet activity   Assist Walk 150 feet activity did not occur: Safety/medical concerns         Walk 10 feet on uneven surface  activity   Assist Walk 10 feet on uneven surfaces activity did not occur: Safety/medical concerns         Wheelchair     Assist Will patient use wheelchair at discharge?: No (No PT lomg term goals set)              Wheelchair 50 feet with 2 turns activity    Assist            Wheelchair 150 feet activity     Assist          Blood pressure 94/72, pulse 72, temperature 98.5 F (36.9 C), resp. rate 18, height 5\' 7"  (1.702 m), weight 71 kg, SpO2 99 %.  Medical Problem List and Plan: 1.  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  Antithrombotics: -DVT/anticoagulation: Subcutaneous heparin initiated 12/11/2019,dopplers neg              -antiplatelet therapy: N/A 3. Pain Management: Tylenol as needed,    Topirimate 25mg  BID, DC'd due to confusion  Appears controlled on 7/22 4. Mood: Provide emotional support             -antipsychotic agents: N/A 5. Neuropsych: This patient is not fully capable of making complex decisions on his own behalf. 6. Skin/Wound Care: Routine skin checks 7. Fluids/Electrolytes/Nutrition: Routine in and outs. 8.  Post stroke dysphagia.  Dysphagia #3 thins liquids.  Follow-up speech therapy.  Advance diet as tolerated 9.  Left lung pneumonia.  Intravenous Unasyn completed 10.  Hypertension.  Norvasc 10 mg daily, Coreg 25 mg twice daily, clonidine 0.3 mg twice daily  Hydralazine 100 mg every 8 hours, d/ced on 7/10  Lisinopril 10 mg twice daily, decreased to 5 twice daily on 7/11, decreased to daily on 7/14.   Vitals:   01/05/20 1938 01/06/20 0329  BP: 122/85 94/72  Pulse: 84 72  Resp: 18 18  Temp: 98.4 F (36.9 C) 98.5 F (36.9 C)  SpO2: 99% 99%   7/22, controlled but soft will reduce clonidine              11.  Urine drug screen positive marijuana.  Counseled on appropriate 12.  Pre renal azotemia Improved   Creatinine 1.62 on 7/14, improved to 1.33 on 7/19  Continue to encourage hydration 13.  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   This will likely occur more often at night.  Monitor for signs of alcohol withdrawal although drinking pattern was mainly on the weekends. 14.  Sleep disturbance   Trial Seroquel increased to 50 mg nightly with improvement 15.  Leukocytosis: Resolved   WBCs 10.4 on 7/12 16.  Transaminitis: Resolved 17.  Hyponatremia- resolved  18.  Normochomic anemia  Hemoglobin 12.3 on 7/14  LOS: 22 days A FACE TO Simpson E Delcia Spitzley 01/06/2020, 8:15 AM

## 2020-01-06 NOTE — Progress Notes (Signed)
Patient ID: Dennis Mercado, male   DOB: 01/21/1981, 39 y.o.   MRN: [id number]   Drop Arm Commode and Tub Transfer Bench Ordered though [employer]

## 2020-01-06 NOTE — Progress Notes (Signed)
Physical Therapy Weekly Progress Note  Patient Details  Name: Dennis Mercado MRN: [id number] Date of Birth: 1981/04/17  Beginning of progress report period: December 30, 2019 End of progress report period: January 06, 2020  Today's Date: 01/06/2020 PT Individual Time: 5638-9373 PT Individual Time Calculation (min): 58 min   Patient has met 3 of 3 short term goals.  Dennis Mercado is progressing well with therapy demonstrating increasing L LE muscle activation, improving motor planning/motor coordination, improving trunk control and standing balance, as well as progressing with gait training. He is performing supine<>sit with min assist, sit<>stands with min assist, squat pivot transfers min assist, and ambulating up to 28f using RW with mod assist of 1 and +2 min assist for AD management. Though improving, he continues to demonstrate L inattention, impaired visual perceptual skills, significant L hemiparesis, impaired awareness, and impaired sensation. Based on pt's slower than anticipated progress with gait training, still requiring skilled assistance, have added wheelchair propulsion mobility goals to promote increased independence upon discharge.   Patient continues to demonstrate the following deficits muscle weakness and muscle paralysis, decreased cardiorespiratoy endurance, impaired timing and sequencing, abnormal tone, unbalanced muscle activation, decreased coordination and decreased motor planning, decreased visual perceptual skills and decreased visual motor skills, decreased midline orientation and decreased attention to left, decreased attention, decreased awareness, decreased problem solving, decreased safety awareness, decreased memory and delayed processing and decreased sitting balance, decreased standing balance, decreased postural control and decreased balance strategies and therefore will continue to benefit from skilled PT intervention to increase functional independence with  mobility.  Patient not progressing toward long term goals.  See goal revision. Added wheelchair propulsion goals and downgraded standing balance goal. Continue plan of care.   PT Short Term Goals Week 3:  PT Short Term Goal 1 (Week 3): Pt will perform supine<>sit with min assist PT Short Term Goal 1 - Progress (Week 3): Met PT Short Term Goal 2 (Week 3): Pt will perform sit<>stand with min assist PT Short Term Goal 2 - Progress (Week 3): Met PT Short Term Goal 3 (Week 3): Pt will perform bed<>chair transfers with min assist PT Short Term Goal 3 - Progress (Week 3): Met Week 4:  PT Short Term Goal 1 (Week 4): = to LTGS based on ELOS  Skilled Therapeutic Interventions/Progress Updates:  Ambulation/gait training;Community reintegration;DME/adaptive equipment instruction;Neuromuscular re-education;Psychosocial support;Stair training;UE/LE Strength taining/ROM;Wheelchair propulsion/positioning;Balance/vestibular training;Discharge planning;Functional electrical stimulation;Pain management;Skin care/wound management;UE/LE Coordination activities;Therapeutic Activities;Cognitive remediation/compensation;Disease management/prevention;Functional mobility training;Patient/family education;Splinting/orthotics;Therapeutic Exercise;Visual/perceptual remediation/compensation    Pt received sitting in w/c with his wife present and pt agreeable to therapy session. Therapist ensured pt's wife had no questions/concerns from prior education/training sessions - she denies. Educated on plan for custom wheelchair evaluation on Monday 7/26 to ensure pt receives lightweight wheelchair with contoured back support and pressure reliving cushion. Plan to perform real car transfer today. [place] [place]. Pt/wife performed squat pivot transfer w/c<>[employer]aru crosstrek 2x with min progressed to no cuing from therapist and pt's wife demoing excellent set-up and management of L hemibody during mod  assist transfer - pt/wife report feeling confident to perform this transfer. Transported back up to rehab floor. Educated pt's wife on proper technique to manage wheelchair up/down 1 step for home entry - therapist demonstrated and provided education on form for good body mechanics. [name] bumped pt up/down 1 step in w/c 2x initially requiring CGA and progressed to close supervision - educated her on having +2 assist present for safety. Pt/wife report  no questions/concerns regarding training today and report feeling confident to assist patient at discharge. Therapy session transitioned to gait training. Donned L LE Sprystep max GRAFO and L toe cap. Gait training ~24f, ~757fusing RW (with L hand splint) with mod assist of 1 and intermittent min assist of 2nd person for AD management - requires max/total assist for L LE advancement during swing though pt continuing to demo initiation of this movement and min/mod assist for L knee control in stance to promote extension but now pt starting to have knee hyperextension during stance. Transported back to room and left seated in w/c with needs in reach, seat belt alarm on, and pt's wife present.  Therapy Documentation Precautions:  Precautions Precautions: Fall Precaution Comments: L UE/LE severe paresis with impaired sensation; impaired safety awareness Restrictions Weight Bearing Restrictions: No  Pain: No reports of pain throughout session.  Therapy/Group: Individual Therapy  CaTawana Scale PT, DPT, CSRS  01/06/2020, 12:46 PM

## 2020-01-06 NOTE — Plan of Care (Signed)
  Problem: RH Balance Goal: LTG Patient will maintain dynamic standing balance (PT) Description: LTG:  Patient will maintain dynamic standing balance with assistance during mobility activities (PT) Flowsheets (Taken 01/06/2020 1859) LTG: Pt will maintain dynamic standing balance during mobility activities with:: (downgraded based on pt progress) Moderate Assistance - Patient 50 - 74% Note: downgraded based on pt progress   Problem: RH Wheelchair Mobility Goal: LTG Patient will propel w/c in controlled environment (PT) Description: LTG: Patient will propel wheelchair in controlled environment, # of feet with assist (PT) 01/06/2020 1900 by Tawana Scale, PT Flowsheets (Taken 01/06/2020 1900) LTG: Pt will propel w/c in controlled environ  assist needed:: (added goal to promote increased independence at wheelchair level due to slow progress with ambulation) Minimal Assistance - Patient > 75% LTG: Propel w/c distance in controlled environment: 19ft Note: added goal to promote increased independence at wheelchair level due to slow progress with ambulation 01/06/2020 1900 by Tawana Scale, PT Reactivated Goal: LTG Patient will propel w/c in home environment (PT) Description: LTG: Patient will propel wheelchair in home environment, # of feet with assistance (PT). 01/06/2020 1900 by Tawana Scale, PT Flowsheets (Taken 01/06/2020 1900) LTG: Pt will propel w/c in home environ  assist needed:: (added goal to promote increased independence at wheelchair level due to slow progress with ambulation) Moderate Assistance - Patient 50 - 74% LTG: Propel w/c distance in home environment: 33ft Note: added goal to promote increased independence at wheelchair level due to slow progress with ambulation 01/06/2020 1900 by Tawana Scale, PT Reactivated

## 2020-01-06 NOTE — Progress Notes (Signed)
Speech Language Pathology Weekly Progress and Session Note  Patient Details  Name: Dennis Mercado MRN: [id number] Date of Birth: Oct 02, 1980  Beginning of progress report period: December 30, 2019 End of progress report period: January 06, 2020  Today's Date: 01/06/2020 SLP Individual Time: 0730-0800 SLP Individual Time Calculation (min): 30 min  Short Term Goals: Week 3: SLP Short Term Goal 1 (Week 3): Pt will demonstrate efficient mastication and oral clearance of regular texture solids with Supervision A verbal cues fo ruse of strategies across 2 sessions prior to upgrade. SLP Short Term Goal 1 - Progress (Week 3): Met SLP Short Term Goal 2 (Week 3): Pt will demonstrate ability to problem solve basic situations with Mod A verbal/visual cues. SLP Short Term Goal 2 - Progress (Week 3): Met SLP Short Term Goal 3 (Week 3): Pt will sustain attention to functional tasks for 3 minute intervals with Min A verbal/visual cues. SLP Short Term Goal 3 - Progress (Week 3): Met SLP Short Term Goal 4 (Week 3): Pt will recall new and/or daily information with Mod A cues for use of strategies/aids. SLP Short Term Goal 4 - Progress (Week 3): Met SLP Short Term Goal 5 (Week 3): Pt will detect functional errors with Mod A multimodal cues. SLP Short Term Goal 5 - Progress (Week 3): Met    New Short Term Goals: Week 4: SLP Short Term Goal 1 (Week 4): STG=LTG due to remaining LOS  Weekly Progress Updates: Pt has made excellent functional gains and met 5 out of 5 short term goals this reporting period. Pt is currently Supervision-Min assist for basic to mildly complex tasks due to cognitive impairments impacting his problem solving, emergent awareness, attention, and short term memory. Pt was upgraded to a regular texture diet with thin liquids and supervision has been decreased to intermittent due to independence with use of strategies for oral clearance. Pt has demonstrated drastically improved sustained attention  and problem solving this week, with far less confabulation and language of confusion. Pt and family education is ongoing. Pt would continue to benefit from skilled ST while inpatient in order to maximize functional independence and reduce burden of care prior to discharge. Anticipate that pt will need 24/7 supervision at discharge in addition to [place] follow up at next level of care.      Intensity: Minumum of 1-2 x/day, 30 to 90 minutes Frequency:   Duration/Length of Stay: 01/12/20 Treatment/Interventions: Cognitive remediation/compensation;Cueing hierarchy;Dysphagia/aspiration precaution training;Functional tasks;Patient/family education;Therapeutic Activities;Internal/external aids;Environmental controls   Daily Session  Skilled Therapeutic Interventions: Pt was seen for skilled ST targeting dysphagia. SLP facilitated session with trial tray of regular texture breakfast solids along with thin liquids in order to assess pt's readiness for upgrade. Pt's mastication was fully efficient and he demonstrated ability to use strategies for complete oral clearance of solids Mod I. His sustained attention is also vastly improved over the last few sessions, requiring only Supervision A verbal cues for redirection to tasks. Would recommend pt upgrade to regular textures, continue thins, meds whole with thin, and intermittent supervision during meals. Note left in room for pt's wife to update her regarding regular textures (she will be here later this afternoon). Pt left in bed with alarm set and needs within reach. Continue per current plan of care.            Pain Pain Assessment Pain Scale: 0-10 Pain Score: 0-No pain  Therapy/Group: Individual Therapy  Arbutus Leas 01/06/2020, 7:11 AM

## 2020-01-06 NOTE — Progress Notes (Signed)
Occupational Therapy Session Note  Patient Details  Name: Dennis Mercado MRN: [id number] Date of Birth: 1981/05/17  Today's Date: 01/06/2020 OT Individual Time: 5009-3818  &  1255 OT Individual Time Calculation (min): 42 min   &  1344   Short Term Goals: Week 4:  OT Short Term Goal 1 (Week 4): Continue working on established LTGs set at min assist overall.  Skilled Therapeutic Interventions/Progress Updates:    AM session:   Patient seated w/c, alert and ready for therapy session.  He denies pain and states that he would like to use the bathroom and review use of bedside commode this session.  SPT w/c to toilet with mod A to left (CGA to right after instruction related to hand and LB placement)  Continent of bowel and bladder, min A for hygiene mod A for CM.   Reviewed set up of w/c and drop arm commode - he was able to demonstrate understanding of arm rest removal/placement.  Sit pivot transfer on/off commode - CG A to right, min A to left with review of options for CM/hygiene using da commode.  Reviewed positioning and posture at w/c level.  He remained in w/c at close of session, seat belt alarm set and call bell in hand.     PM session:   Patient seated in w/c, wife present - both ready for family education session with focus on shower and adl techniques.  Sit pivot transfer to/from shower bench and w/c with cues/strategies for safety both receptive and demonstrate good safety and carryover.  Shower/bathing tasks completed with good communication and safety.  Dressing tasks completed w/c level - provided strategies to promote independence and safety with good carryover.  Discussed methods to support left UE, guard in stance for CM with right UE support/balance strategies.  Reviewed posture and proximal motor control/faciliatation.  Reviewed OP therapy programming and what to expect.  Patient remained in w/c at close of session for upcoming PT session.   Offered additional FT sessions early next  week as they want to increase comfort level.      Therapy Documentation Precautions:  Precautions Precautions: Fall Precaution Comments: L UE/LE severe paresis with impaired sensation; impaired safety awareness Restrictions Weight Bearing Restrictions: No  Therapy/Group: Individual Therapy  Carlos Levering 01/06/2020, 7:38 AM

## 2020-01-07 ENCOUNTER — Inpatient Hospital Stay (HOSPITAL_COMMUNITY): Payer: BC Managed Care – PPO

## 2020-01-07 ENCOUNTER — Inpatient Hospital Stay (HOSPITAL_COMMUNITY): Payer: BC Managed Care – PPO | Admitting: Occupational Therapy

## 2020-01-07 ENCOUNTER — Inpatient Hospital Stay (HOSPITAL_COMMUNITY): Payer: BC Managed Care – PPO | Admitting: Speech Pathology

## 2020-01-07 NOTE — Progress Notes (Signed)
Physical Therapy Session Note  Patient Details  Name: Dennis Mercado MRN: [id number] Date of Birth: 1980/08/05  Today's Date: 01/07/2020 PT Individual Time: 1415-1500  And 800-900 PT Individual Time Calculation (min): 45 min  And 60 min Short Term Goals: Week 1:  PT Short Term Goal 1 (Week 1): Pt will perform supine<>sit with mod assist PT Short Term Goal 1 - Progress (Week 1): Progressing toward goal PT Short Term Goal 2 (Week 1): Pt will perform bed<>chair transfer with max assist (not using lift equipment) PT Short Term Goal 2 - Progress (Week 1): Met PT Short Term Goal 3 (Week 1): Pt will initiate gait training PT Short Term Goal 3 - Progress (Week 1): Met PT Short Term Goal 4 (Week 1): Pt will participate in sitting balance assessment PT Short Term Goal 4 - Progress (Week 1): Progressing toward goal Week 2:  PT Short Term Goal 1 (Week 2): Pt will perform supine<>sit with mod assist PT Short Term Goal 1 - Progress (Week 2): Met PT Short Term Goal 2 (Week 2): Pt will perform sit<>stand with min assist PT Short Term Goal 2 - Progress (Week 2): Progressing toward goal PT Short Term Goal 3 (Week 2): Pt will perform squat pivot transfers with min assist PT Short Term Goal 3 - Progress (Week 2): Progressing toward goal Week 3:  PT Short Term Goal 1 (Week 3): Pt will perform supine<>sit with min assist PT Short Term Goal 1 - Progress (Week 3): Met PT Short Term Goal 2 (Week 3): Pt will perform sit<>stand with min assist PT Short Term Goal 2 - Progress (Week 3): Met PT Short Term Goal 3 (Week 3): Pt will perform bed<>chair transfers with min assist PT Short Term Goal 3 - Progress (Week 3): Met  Skilled Therapeutic Interventions/Progress Updates:    Am session: PAIN pt c/o pain LLE w/tactile stim.  Care taken w/dressing/donning teds/shoes/afo.    Supine to sit w/min assist and cues.  Scoots to edge w/cues for attention to L hemibody.  Therapist donned teds/afo/shoes for time management.   SPT bed to wc to R w/min assist for safety.    Pt instructed w/wc propulsion using RUE/RLE then propells wc 19f including turning x 1 w/verbal cues, occasional min assist to coordinate use of limbs, uses target of pattern on floor to maintain straight path.    Squat PT wc to mat w/cga.   Maxisky harness donned by therapist w/pt sitting w/supervision and standing w/min assist.    Gait training in maxisky using mirror for feedback and mod assist of 1 and verbal/tactile/visual cues/instruction.  Emphasis on: 1. controlled R wt shifting vs excessive w/swing on L 2. Forward advancement of L hip over knee/foot from loading thru midstance while maintaining erect trunk.  Pt responded well to training and showed improved control w/cueing.    rpeated 342ftrials x 5 as above Harness removed by therapist in sitting/standing as above.  Squat pvt to wc w/min assist.  Pt transported back to room at end of session and Pt left oob in wc w/alarm belt set and needs in reach  PM SESSION PAIN denies pain this pm. Pt initially resting in supine, agreeable to treament.  Supine to sit w/min assist and moderate cuing to attend to L hemibody.  Scoots w/cues and cga.  Givmor sling positined by therapist.  Squat pvt bed to wc to L side w/min assist, cues for set up.  wc propulsion including negotiating doorway w/cues, occasional min assist, cues  to attend to L environment and maintain straight path.  Pt STS in hallway w/rail to R, L AFO. Gait 45f x 1 w/mod assist of 1 for LLE advancement, stabilization of L knee w/loading,  cues to increase step length on r to facilitate trailing limb L, cues for wt shifting and posture as in am, but decreased cueing needed w/good carryover from am session. Requires assist for advancement of LLE 75% time, positioning at IC 100% time.    Pt then performed gait 536fx 1, 6080f 1, 51f31f1 using hemiwalker on R, mod assist for gait as above, second person to assist minimally  w/sequencing/placement/stabilizing of hemiwalker  At end of session, pt transported back to room.  Pt left oob in wc w/alarm belt set and needs in reach    Therapy Documentation Precautions:  Precautions Precautions: Fall Precaution Comments: L UE/LE severe paresis with impaired sensation; impaired safety awareness Restrictions Weight Bearing Restrictions: No   Therapy/Group: Individual Therapy  BarbCallie Fielding  [place]3/2021, 4:24 PM

## 2020-01-07 NOTE — Progress Notes (Signed)
Occupational Therapy Session Note  Patient Details  Name: Dennis Mercado MRN: [id number] Date of Birth: [birth year]  Today's Date: 01/07/2020 OT Individual Time: 1000-1100 OT Individual Time Calculation (min): 60 min    Short Term Goals: Week 3:  OT Short Term Goal 1 (Week 3): Pt will complete UB dressing with min assist following hemi dressing techniques. OT Short Term Goal 1 - Progress (Week 3): Met OT Short Term Goal 2 (Week 3): Pt will donn pants and brief with mod assist following hemi techniques for two consecutive sessions. OT Short Term Goal 2 - Progress (Week 3): Met OT Short Term Goal 3 (Week 3): Pt will complete sit to stand with min assist during LB selfcare tasks. OT Short Term Goal 3 - Progress (Week 3): Met OT Short Term Goal 4 (Week 3): Pt will complete toilet transfer with min assist to the 3:1 squat or stand pivot OT Short Term Goal 4 - Progress (Week 3): Not met OT Short Term Goal 5 (Week 3): Pt will sequence through bathing tasks with no more than mod instructional cueing. OT Short Term Goal 5 - Progress (Week 3): Met Week 4:  OT Short Term Goal 1 (Week 4): Continue working on established LTGs set at min assist overall.  Skilled Therapeutic Interventions/Progress Updates:    Patient seated in w/c, alert and ready for therapy session.   Reviewed positioning and need to support left UE in seated position.  Pain in left hand with repositioning that resolves with support.  Patient able to propel w.c to therapy gym with CS and occ min A to avoid obstacles.  Sit pivot transfer w/c to/from mat table with min a and min cues for w/c set up (moving arm rest, leg rest, etc)  Completed neuromuscular reeducation of trunk and Left UE with weight bearing, facilitation of extensors, inhibition of flexors, using hand paddle and various surfaces in seated and supine positions.  In supine able to inhibit with ease but he has difficulty with firing scapular or shoulder muscles in this position.   Full PROM without pain in supine.  Sit to/from supine with min A and cues for technique.  Able to facilitate scapula and shoulder in seated position with cues/strategies to maintain shoulder alignment.  He notes occ pain in left wrist with weight bearing otherwise no complaints.  Good response through trunk with all activities/cues.  He returned to bed at close of session, min A for transfer to right, min A for bed mobility.  Bed alarm set and call bell in reach at close of session.    Therapy Documentation Precautions:  Precautions Precautions: Fall Precaution Comments: L UE/LE severe paresis with impaired sensation; impaired safety awareness Restrictions Weight Bearing Restrictions: No   Therapy/Group: Individual Therapy  Carlos Levering 01/07/2020, 7:30 AM

## 2020-01-07 NOTE — Progress Notes (Signed)
Speech Language Pathology Daily Session Note  Patient Details  Name: Dennis Mercado MRN: [id number] Date of Birth: Oct 23, 1980  Today's Date: 01/07/2020 SLP Individual Time: 4473-9584 SLP Individual Time Calculation (min): 28 min  Short Term Goals: Week 4: SLP Short Term Goal 1 (Week 4): STG=LTG due to remaining LOS  Skilled Therapeutic Interventions: Pt was seen for skilled ST targeting cognitive goals. SLP facilitated session with a semi-complex monthly scheduling task. Pt required Min A verbal cues for redirection to the task, however only Supervision A verbal cues for problem solving, emergent awareness, and planning. Pt left in bed with alarm set and needs within reach. Continue per current plan of care.          Pain Pain Assessment Pain Scale: 0-10 Pain Score: 0-No pain  Therapy/Group: Individual Therapy  Arbutus Leas 01/07/2020, 3:05 PM

## 2020-01-07 NOTE — Progress Notes (Signed)
[facility] PHYSICAL MEDICINE & REHABILITATION PROGRESS NOTE   Subjective/Complaints: Had PT,this am , brushing teeth with CNA assist while sitting in WC , no pain c/os  Asking for RX Saebo, FES device for Left shoulder subluxation Pt has very little acitve movement LUE with palpable sublux  ROS: Patient denies CP, SOB, N/V/D Objective:   No results found. No results for input(s): WBC, HGB, HCT, PLT in the last 72 hours. No results for input(s): NA, K, CL, CO2, GLUCOSE, BUN, CREATININE, CALCIUM in the last 72 hours.  Intake/Output Summary (Last 24 hours) at 01/07/2020 0907 Last data filed at 01/07/2020 0839 Gross per 24 hour  Intake 844 ml  Output --  Net 844 ml     Physical Exam: Vital Signs Blood pressure 113/85, pulse 72, temperature 97.8 F (36.6 C), resp. rate 16, height 5\' 7"  (1.702 m), weight 71 kg, SpO2 100 %.  General: No acute distress Mood and affect are appropriate Heart: Regular rate and rhythm no rubs murmurs or extra sounds Lungs: Clear to auscultation, breathing unlabored, no rales or wheezes Abdomen: Positive bowel sounds, soft nontender to palpation, nondistended Extremities: No clubbing, cyanosis, or edema Skin: No evidence of breakdown, no evidence of rash MSK- 1 FB inferior  sublux subacromial L shoulder  Motor: LUE/LLE: 0/5 proximal distal, senses pain LUE and LLE Increased tone noted in left upper extremity/left lower extremity Left wrist and ankle clonus ongoing.  Assessment/Plan: 1. 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  Physiatrist is providing close team supervision and 24 hour management of active medical problems listed below.  Physiatrist and rehab team continue to assess barriers to discharge/monitor patient progress toward functional and medical goals  Care Tool:  Bathing    Body parts bathed by patient: Left arm,  Chest, Abdomen, Front perineal area, Buttocks, Right upper leg, Left upper leg, Right lower leg, Left lower leg, Face   Body parts bathed by helper: Right arm     Bathing assist Assist Level: Moderate Assistance - Patient 50 - 74%     Upper Body Dressing/Undressing Upper body dressing Upper body dressing/undressing activity did not occur (including orthotics): N/A What is the patient wearing?: Pull over shirt    Upper body assist Assist Level: Moderate Assistance - Patient 50 - 74%    Lower Body Dressing/Undressing Lower body dressing      What is the patient wearing?: Pants, Incontinence brief     Lower body assist Assist for lower body dressing: Moderate Assistance - Patient 50 - 74%     Toileting Toileting    Toileting assist Assist for toileting: Set up assist Assistive Device Comment: urinal (assist placing urinal, pt will hold)   Transfers Chair/bed transfer  Transfers assist  Chair/bed transfer activity did not occur: N/A  Chair/bed transfer assist level: Minimal Assistance - Patient > 75% (squat pivot)     Locomotion Ambulation   Ambulation assist   Ambulation activity did not occur: Safety/medical concerns  Assist level: 2 helpers (mod A of 1 and +2 min assist) Assistive device: Walker-rolling Max distance: 15ft   Walk 10 feet activity   Assist  Walk 10 feet activity did not occur: Safety/medical concerns  Assist level: 2 helpers (mod A of 1 and +2 min assist) Assistive device: Walker-rolling   Walk 50 feet activity   Assist Walk 50 feet with 2 turns activity did not occur: Safety/medical concerns (not yet  performing turns)  Assist level: 2 helpers Assistive device: Lite Gait    Walk 150 feet activity   Assist Walk 150 feet activity did not occur: Safety/medical concerns         Walk 10 feet on uneven surface  activity   Assist Walk 10 feet on uneven surfaces activity did not occur: Safety/medical concerns          Wheelchair     Assist Will patient use wheelchair at discharge?: No (No PT lomg term goals set)             Wheelchair 50 feet with 2 turns activity    Assist            Wheelchair 150 feet activity     Assist          Blood pressure 113/85, pulse 72, temperature 97.8 F (36.6 C), resp. rate 16, height 5\' 7"  (1.702 m), weight 71 kg, SpO2 100 %.  Medical Problem List and Plan: 1.  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  Antithrombotics: -DVT/anticoagulation: Subcutaneous heparin initiated 12/11/2019,dopplers neg              -antiplatelet therapy: N/A 3. Pain Management: Tylenol as needed,    Topirimate 25mg  BID, DC'd due to confusion  Appears controlled on 7/23 4. Mood: Provide emotional support             -antipsychotic agents: N/A 5. Neuropsych: This patient is not fully capable of making complex decisions on his own behalf. 6. Skin/Wound Care: Routine skin checks 7. Fluids/Electrolytes/Nutrition: Routine in and outs. 8.  Post stroke dysphagia.  Dysphagia #3 thins liquids.  Follow-up speech therapy.  Advance diet as tolerated 9.  Left lung pneumonia.  Intravenous Unasyn completed 10.  Hypertension.  Norvasc 10 mg daily, Coreg 25 mg twice daily, clonidine 0.3 mg twice daily  Hydralazine 100 mg every 8 hours, d/ced on 7/10  Lisinopril 10 mg twice daily, decreased to 5 twice daily on 7/11, decreased to daily on 7/14.   Vitals:   01/06/20 2032 01/07/20 0530  BP: 109/82 113/85  Pulse: 82 72  Resp: 16 16  Temp: 98.5 F (36.9 C) 97.8 F (36.6 C)  SpO2: 99% 100%   7/23, controlled but soft , clonidine dose down to 1mg  BID               11.  Urine drug screen positive marijuana.  Counseled on appropriate 12.  Pre renal azotemia Improved   Creatinine  1.62 on 7/14, improved to 1.33 on 7/19  Continue to encourage hydration, recorded intake 400-648ml daily , 441ml in today with breakfast, meal intake 100% 13.  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  This will likely occur more often at night.  Monitor for signs of alcohol withdrawal although drinking pattern was mainly on the weekends. 14.  Sleep disturbance   Trial Seroquel increased to 50 mg nightly with improvement 15.  Leukocytosis: Resolved   WBCs 10.4 on 7/12 16.  Transaminitis: Resolved 17.  Hyponatremia- resolved  18.  Normochomic anemia  Hemoglobin 12.3 on 7/14  LOS: 23 days A FACE TO [place] Shelda Truby 01/07/2020, 9:07 AM

## 2020-01-07 NOTE — Plan of Care (Signed)
°  Problem: Consults Goal: RH STROKE PATIENT EDUCATION Description: See Patient Education module for education specifics  Outcome: Progressing   Problem: RH BLADDER ELIMINATION Goal: RH STG MANAGE BLADDER WITH ASSISTANCE Description: STG Manage Bladder With min Assistance Outcome: Progressing   Problem: RH SKIN INTEGRITY Goal: RH STG SKIN FREE OF INFECTION/BREAKDOWN Description: Pt will be free of skin breakdown/infection with min assist during CIR stay Outcome: Progressing   Problem: RH SAFETY Goal: RH STG ADHERE TO SAFETY PRECAUTIONS W/ASSISTANCE/DEVICE Description: STG Adhere to Safety Precautions With cues/reminders Assistance/Device. Outcome: Progressing   Problem: RH KNOWLEDGE DEFICIT Goal: RH STG INCREASE KNOWLEDGE OF HYPERTENSION Description: Pt/family will be able to demonstrate knowledge of HTN management with medications and diet precautions and ways to monitor blood pressure with min assist using handouts/booklets provided by staff Outcome: Progressing Goal: RH STG INCREASE KNOWLEGDE OF HYPERLIPIDEMIA Description: Pt/family will be able to demonstrate knowledge of HLD management with medications and diet precautions with min assist using handouts/booklets provided by staff Outcome: Progressing Goal: RH STG INCREASE KNOWLEDGE OF STROKE PROPHYLAXIS Description: Pt/family will be able to demonstrate knowledge of stroke prevention with medications and diet precautions and ways to monitor signs/symptoms of stroke with min assist using handouts/booklets provided by staff Outcome: Progressing   Problem: Consults Goal: RH STROKE PATIENT EDUCATION Description: See Patient Education module for education specifics  Outcome: Progressing Goal: Nutrition Consult-if indicated Outcome: Progressing Goal: Diabetes Guidelines if Diabetic/Glucose > 140 Description: If diabetic or lab glucose is > 140 mg/dl - Initiate Diabetes/Hyperglycemia Guidelines & Document Interventions  Outcome:  Progressing   Problem: RH BOWEL ELIMINATION Goal: RH STG MANAGE BOWEL WITH ASSISTANCE Description: STG Manage Bowel with Assistance. Outcome: Progressing Goal: RH STG MANAGE BOWEL W/MEDICATION W/ASSISTANCE Description: STG Manage Bowel with Medication with Assistance. Outcome: Progressing   Problem: RH BLADDER ELIMINATION Goal: RH STG MANAGE BLADDER WITH ASSISTANCE Description: STG Manage Bladder With Assistance Outcome: Progressing Goal: RH STG MANAGE BLADDER WITH MEDICATION WITH ASSISTANCE Description: STG Manage Bladder With Medication With Assistance. Outcome: Progressing Goal: RH STG MANAGE BLADDER WITH EQUIPMENT WITH ASSISTANCE Description: STG Manage Bladder With Equipment With Assistance Outcome: Progressing   Problem: RH SKIN INTEGRITY Goal: RH STG SKIN FREE OF INFECTION/BREAKDOWN Outcome: Progressing Goal: RH STG MAINTAIN SKIN INTEGRITY WITH ASSISTANCE Description: STG Maintain Skin Integrity With Assistance. Outcome: Progressing Goal: RH STG ABLE TO PERFORM INCISION/WOUND CARE W/ASSISTANCE Description: STG Able To Perform Incision/Wound Care With Assistance. Outcome: Progressing   Problem: RH SAFETY Goal: RH STG ADHERE TO SAFETY PRECAUTIONS W/ASSISTANCE/DEVICE Description: STG Adhere to Safety Precautions With Assistance/Device. Outcome: Progressing Goal: RH STG DECREASED RISK OF FALL WITH ASSISTANCE Description: STG Decreased Risk of Fall With Assistance. Outcome: Progressing   Problem: RH COGNITION-NURSING Goal: RH STG USES MEMORY AIDS/STRATEGIES W/ASSIST TO PROBLEM SOLVE Description: STG Uses Memory Aids/Strategies With Assistance to Problem Solve. Outcome: Progressing Goal: RH STG ANTICIPATES NEEDS/CALLS FOR ASSIST W/ASSIST/CUES Description: STG Anticipates Needs/Calls for Assist With Assistance/Cues. Outcome: Progressing   Problem: RH PAIN MANAGEMENT Goal: RH STG PAIN MANAGED AT OR BELOW PT'S PAIN GOAL Outcome: Progressing   Problem: RH KNOWLEDGE  DEFICIT Goal: RH STG INCREASE KNOWLEDGE OF DIABETES Outcome: Progressing Goal: RH STG INCREASE KNOWLEDGE OF HYPERTENSION Outcome: Progressing Goal: RH STG INCREASE KNOWLEDGE OF DYSPHAGIA/FLUID INTAKE Outcome: Progressing Goal: RH STG INCREASE KNOWLEGDE OF HYPERLIPIDEMIA Outcome: Progressing Goal: RH STG INCREASE KNOWLEDGE OF STROKE PROPHYLAXIS Outcome: Progressing

## 2020-01-08 ENCOUNTER — Inpatient Hospital Stay (HOSPITAL_COMMUNITY): Payer: BC Managed Care – PPO | Admitting: Occupational Therapy

## 2020-01-08 ENCOUNTER — Inpatient Hospital Stay (HOSPITAL_COMMUNITY): Payer: BC Managed Care – PPO | Admitting: Physical Therapy

## 2020-01-08 NOTE — Progress Notes (Signed)
Physical Therapy Session Note  Patient Details  Name: Dennis Mercado MRN: [id number] Date of Birth: 1981-03-08  Today's Date: 01/08/2020 PT Individual Time:  -  [id number]   60 min   Short Term Goals: Week 4:  PT Short Term Goal 1 (Week 4): = to LTGS based on ELOS  Skilled Therapeutic Interventions/Progress Updates:   Pt received supine in bed and agreeable to PT. Supine>sit transfer with min assist to bring LLE to the R side of bed. PT applied R shoe and L AFO.shoe while pt sitting EOB with supevision assist.  Sit<>stand from EOB with min assist and RUE supported on WC for PT to pull pants to waist. Stand pivot transfer to [facility] with CGA to provide tactile cues to L knee for improved extension with RLE step. Upper body bathing and dressing in WC with set up assist from PT.   WC mobility with hemi technique x 177f with supervision assist. Pt reporting pain/soreness in the R HS requiring rest break to continue.   Gait training in rehab gym with LAFO, L hand splint, and RW. 2 x 371fwith mod assist to advance the LLE. Pt noted to be able to advance the LLE independently 10% of the time and was able to prevent GR 75% of gait training on this day with tactile cues    WC propulsion with BLE only to force use of HS; mod assist to improve hip flexion with advancement of the LLE.  Pt returned to room and performed stand pivot transfer to bed with UE support on bed rail. Sit>supine completed with min assist for control of LLE, and left supine in bed with call bell in reach and all needs met.       Therapy Documentation Precautions:  Precautions Precautions: Fall Precaution Comments: L UE/LE severe paresis with impaired sensation; impaired safety awareness Restrictions Weight Bearing Restrictions: No Vital Signs: Therapy Vitals Temp: (!) 97.4 F (36.3 C) Temp Source: Oral Pulse Rate: 74 Resp: 18 BP: 119/85 Patient Position (if appropriate): Lying Oxygen Therapy SpO2: 100 % O2 Device:  Room Air Pain: denies   Therapy/Group: Individual Therapy  AuLorie Phenix/24/2021, 8:02 AM

## 2020-01-08 NOTE — Progress Notes (Signed)
Occupational Therapy Session Note  Patient Details  Name: Dennis Mercado MRN: [id number] Date of Birth: 1980/10/13  Today's Date: 01/08/2020 OT Individual Time: 1345-1456 OT Individual Time Calculation (min): 71 min   Short Term Goals: Week 4:  OT Short Term Goal 1 (Week 4): Continue working on established LTGs set at min assist overall.  Skilled Therapeutic Interventions/Progress Updates:    Pt greeted semi-reclined in bed and agreeable to OT treatment session. Pt wanted to shower this afternoon. Pt completed bed mobility with min A. Min A stand-pivot to wc>tub transfer bench. Pt needed verbal cues to position LLE prior to transfers. Bathing completed with overall min A for sit<>stand when standing to wash buttocks and peri-area. Dressing completed from wc at the sink with min A to thread LLE into pants and min A for balance when standing to pull up pants. OT facilitated weight bearing through L UE and LLE while standing at the sink. Pt with good recall for hemi dressing techniques on upper body, but needed assistance to orient shirt. [name] entered and OT educated on Saebo e-stim placement, protocol, skin integrity, and went through user manual. Pt request to return to bed and completed stand-pivot to stronger side with CGA. Pt left semi-reclined in bed with spouse present, bed alarm on, and call bell in reach. OT placed SAEBO e-stim for 30 minutes on shoulder for subluxation, then 30 minutes for wrist extensors..  OT returned to remove SAEBO with skin intact and no adverse reactions.  Saebo Stim One 330 pulse width 35 Hz pulse rate On 8 sec/ off 8 sec Ramp up/ down 2 sec Symmetrical Biphasic wave form  Max intensity 155mA at 500 Ohm load   Therapy Documentation Precautions:  Precautions Precautions: Fall Precaution Comments: L UE/LE severe paresis with impaired sensation; impaired safety awareness Restrictions Weight Bearing Restrictions: No Pain:  denies pain  Therapy/Group:  Individual Therapy  Valma Cava 01/08/2020, 2:56 PM

## 2020-01-08 NOTE — Progress Notes (Signed)
[facility] PHYSICAL MEDICINE & REHABILITATION PROGRESS NOTE   Subjective/Complaints: Patient seen laying in bed this morning.  He states he slept well overnight, confirmed with nurse tech.  He appears more relaxed and aware compared to last encounter.  He wants to brush his teeth and get ready for today.  He notes that his orthoses were not donned overnight, discussed with nurse tech.  ROS: Denies CP, SOB, N/V/D  Objective:   No results found. No results for input(s): WBC, HGB, HCT, PLT in the last 72 hours. No results for input(s): NA, K, CL, CO2, GLUCOSE, BUN, CREATININE, CALCIUM in the last 72 hours.  Intake/Output Summary (Last 24 hours) at 01/08/2020 1646 Last data filed at 01/08/2020 1300 Gross per 24 hour  Intake 930 ml  Output 725 ml  Net 205 ml     Physical Exam: Vital Signs Blood pressure 121/81, pulse 91, temperature 98.2 F (36.8 C), resp. rate 18, height 5\' 7"  (1.702 m), weight 71 kg, SpO2 99 %. Constitutional: No distress . Vital signs reviewed. HENT: Normocephalic.  Atraumatic. Eyes: EOMI. No discharge. Cardiovascular: No JVD. Respiratory: Normal effort.  No stridor. GI: Non-distended. Skin: Warm and dry.  Intact. Psych: Normal mood.  Normal behavior. Musc: No edema in extremities.  No tenderness in extremities. Motor: LUE/LLE: 0/5 proximal distal Sensation intact to light touch Increased tone noted in left upper extremity/left lower extremity Left wrist and ankle clonus ongoing.  Assessment/Plan: 1. 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  Physiatrist is providing close team supervision and 24 hour management of active medical problems listed below.  Physiatrist and rehab team continue to assess barriers to discharge/monitor patient progress toward functional and medical goals  Care Tool:  Bathing    Body parts bathed by patient: Left  arm, Chest, Abdomen, Front perineal area, Buttocks, Right upper leg, Left upper leg, Right lower leg, Left lower leg, Face   Body parts bathed by helper: Right arm     Bathing assist Assist Level: Moderate Assistance - Patient 50 - 74%     Upper Body Dressing/Undressing Upper body dressing Upper body dressing/undressing activity did not occur (including orthotics): N/A What is the patient wearing?: Pull over shirt    Upper body assist Assist Level: Moderate Assistance - Patient 50 - 74%    Lower Body Dressing/Undressing Lower body dressing      What is the patient wearing?: Pants, Incontinence brief     Lower body assist Assist for lower body dressing: Moderate Assistance - Patient 50 - 74%     Toileting Toileting    Toileting assist Assist for toileting: Set up assist Assistive Device Comment: urinal (assist placing urinal, pt will hold)   Transfers Chair/bed transfer  Transfers assist  Chair/bed transfer activity did not occur: N/A  Chair/bed transfer assist level: Minimal Assistance - Patient > 75%     Locomotion Ambulation   Ambulation assist   Ambulation activity did not occur: Safety/medical concerns  Assist level: Dependent - Patient 0% Assistive device: Maxi Sky Max distance: 30   Walk 10 feet activity   Assist  Walk 10 feet activity did not occur: Safety/medical concerns  Assist level: Dependent - Patient 0% Assistive device: Maxi Sky   Walk 50 feet activity   Assist Walk 50 feet with 2 turns activity did not occur: Safety/medical concerns (not yet performing turns)  Assist level: 2 helpers Assistive device: Economist  Walk 150 feet activity   Assist Walk 150 feet activity did not occur: Safety/medical concerns         Walk 10 feet on uneven surface  activity   Assist Walk 10 feet on uneven surfaces activity did not occur: Safety/medical concerns         Wheelchair     Assist Will patient use wheelchair at  discharge?: No (No PT lomg term goals set)             Wheelchair 50 feet with 2 turns activity    Assist            Wheelchair 150 feet activity     Assist          Blood pressure 121/81, pulse 91, temperature 98.2 F (36.8 C), resp. rate 18, height 5\' 7"  (1.702 m), weight 71 kg, SpO2 99 %.  Medical Problem List and Plan: 1.  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  Antithrombotics: -DVT/anticoagulation: Subcutaneous heparin initiated 12/11/2019,dopplers neg              -antiplatelet therapy: N/A 3. Pain Management: Tylenol as needed,    Topirimate 25mg  BID, DC'd due to confusion  Appears controlled on 7/24 4. Mood: Provide emotional support             -antipsychotic agents: N/A 5. Neuropsych: This patient is not fully capable of making complex decisions on his own behalf. 6. Skin/Wound Care: Routine skin checks 7. Fluids/Electrolytes/Nutrition: Routine in and outs. 8.  Post stroke dysphagia.    Regular textured thins with accommodations.  Follow-up speech therapy.  Advance diet as tolerated 9.  Left lung pneumonia.  Intravenous Unasyn completed 10.  Hypertension.  Norvasc 10 mg daily, Coreg 25 mg twice daily, clonidine 0.3 mg twice daily  Hydralazine 100 mg every 8 hours, d/ced on 7/10  Lisinopril 10 mg twice daily, decreased to 5 twice daily on 7/11, decreased to daily on 7/14.   Vitals:   01/08/20 0437 01/08/20 1456  BP: 119/85 121/81  Pulse: 74 91  Resp: 18 18  Temp: (!) 97.4 F (36.3 C) 98.2 F (36.8 C)  SpO2: 100% 99%   Clonidine dose down to 1mg  BID             Controlled on 7/24 11.  Urine drug screen positive marijuana.  Counseled on appropriate 12.  Pre renal azotemia   Creatinine 1.32 on 7/19, labs ordered for Monday 13.  Cognitive deficits related stroke  with visual illusions.  This will likely occur more often at night.  Monitor for signs of alcohol withdrawal although drinking pattern was mainly on the weekends. 14.  Sleep disturbance   Trial Seroquel increased to 50 mg nightly with improvement  Improving 15.  Leukocytosis: Resolved   WBCs 10.4 on 7/12 16.  Transaminitis: Resolved 17.  Hyponatremia- resolved  18.  Normochomic anemia  Hemoglobin 12.3 on 7/14, labs ordered for Monday  LOS: 24 days A FACE TO FACE EVALUATION WAS PERFORMED  Gaia Gullikson Lorie Phenix 01/08/2020, 4:46 PM

## 2020-01-09 ENCOUNTER — Encounter (HOSPITAL_COMMUNITY): Payer: Self-pay | Admitting: Physical Medicine & Rehabilitation

## 2020-01-09 DIAGNOSIS — D649 Anemia, unspecified: Secondary | ICD-10-CM

## 2020-01-09 NOTE — Progress Notes (Signed)
[facility] PHYSICAL MEDICINE & REHABILITATION PROGRESS NOTE   Subjective/Complaints: Patient seen laying in bed this morning.  He states he slept well overnight.  He denies complaints.  No reported issues overnight.  ROS: Denies CP, SOB, N/V/D  Objective:   No results found. No results for input(s): WBC, HGB, HCT, PLT in the last 72 hours. No results for input(s): NA, K, CL, CO2, GLUCOSE, BUN, CREATININE, CALCIUM in the last 72 hours.  Intake/Output Summary (Last 24 hours) at 01/09/2020 1351 Last data filed at 01/09/2020 1300 Gross per 24 hour  Intake 1000 ml  Output --  Net 1000 ml     Physical Exam: Vital Signs Blood pressure 109/83, pulse 85, temperature 98.8 F (37.1 C), temperature source Oral, resp. rate 17, height 5\' 7"  (1.702 m), weight 71 kg, SpO2 100 %. Constitutional: No distress . Vital signs reviewed. HENT: Normocephalic.  Atraumatic. Eyes: EOMI. No discharge. Cardiovascular: No JVD. Respiratory: Normal effort.  No stridor. GI: Non-distended. Skin: Warm and dry.  Intact. Psych: Normal mood.  Normal behavior. Musc: No edema in extremities.  No tenderness in extremities. Neuro: Alert Motor: LUE/LLE: 0/5 proximal distal, unchanged Increased tone noted in left upper extremity/left lower extremity Left wrist and ankle clonus ongoing.  Assessment/Plan: 1. 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  Physiatrist is providing close team supervision and 24 hour management of active medical problems listed below.  Physiatrist and rehab team continue to assess barriers to discharge/monitor patient progress toward functional and medical goals  Care Tool:  Bathing    Body parts bathed by patient: Left arm, Chest, Abdomen, Front perineal area, Buttocks, Right upper leg, Left upper leg, Right lower leg, Left lower leg, Face   Body parts bathed by helper:  Right arm     Bathing assist Assist Level: Moderate Assistance - Patient 50 - 74%     Upper Body Dressing/Undressing Upper body dressing Upper body dressing/undressing activity did not occur (including orthotics): N/A What is the patient wearing?: Pull over shirt    Upper body assist Assist Level: Moderate Assistance - Patient 50 - 74%    Lower Body Dressing/Undressing Lower body dressing      What is the patient wearing?: Pants, Incontinence brief     Lower body assist Assist for lower body dressing: Moderate Assistance - Patient 50 - 74%     Toileting Toileting    Toileting assist Assist for toileting: Set up assist Assistive Device Comment: urinal (assist placing urinal, pt will hold)   Transfers Chair/bed transfer  Transfers assist  Chair/bed transfer activity did not occur: N/A  Chair/bed transfer assist level: Minimal Assistance - Patient > 75%     Locomotion Ambulation   Ambulation assist   Ambulation activity did not occur: Safety/medical concerns  Assist level: Dependent - Patient 0% Assistive device: Maxi Sky Max distance: 30   Walk 10 feet activity   Assist  Walk 10 feet activity did not occur: Safety/medical concerns  Assist level: Dependent - Patient 0% Assistive device: Maxi Sky   Walk 50 feet activity   Assist Walk 50 feet with 2 turns activity did not occur: Safety/medical concerns (not yet performing turns)  Assist level: 2 helpers Assistive device: Lite Gait    Walk 150 feet activity   Assist Walk 150 feet activity did not occur: Safety/medical concerns         Walk 10 feet on uneven  surface  activity   Assist Walk 10 feet on uneven surfaces activity did not occur: Safety/medical concerns         Wheelchair     Assist Will patient use wheelchair at discharge?: No (No PT lomg term goals set)             Wheelchair 50 feet with 2 turns activity    Assist            Wheelchair 150 feet  activity     Assist          Blood pressure 109/83, pulse 85, temperature 98.8 F (37.1 C), temperature source Oral, resp. rate 17, height 5\' 7"  (1.702 m), weight 71 kg, SpO2 100 %.  Medical Problem List and Plan: 1.  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  Antithrombotics: -DVT/anticoagulation: Subcutaneous heparin initiated 12/11/2019,dopplers neg              -antiplatelet therapy: N/A 3. Pain Management: Tylenol as needed,    Topirimate 25mg  BID, DC'd due to confusion  Controlled on 7/25 4. Mood: Provide emotional support             -antipsychotic agents: N/A 5. Neuropsych: This patient is not fully capable of making complex decisions on his own behalf. 6. Skin/Wound Care: Routine skin checks 7. Fluids/Electrolytes/Nutrition: Routine in and outs. 8.  Post stroke dysphagia.    Regular textured thins with accommodations.  Follow-up speech therapy.  Advance diet as tolerated 9.  Left lung pneumonia.  Intravenous Unasyn completed 10.  Hypertension.  Norvasc 10 mg daily, Coreg 25 mg twice daily, clonidine 0.3 mg twice daily  Hydralazine 100 mg every 8 hours, d/ced on 7/10  Lisinopril 10 mg twice daily, decreased to 5 twice daily on 7/11, decreased to daily on 7/14.   Vitals:   01/09/20 0613 01/09/20 1312  BP: (!) 119/91 109/83  Pulse: 73 85  Resp: 20 17  Temp:  98.8 F (37.1 C)  SpO2:  100%   Clonidine dose down to 1mg  BID             Relatively controlled on 9/48, diastolic blood pressure elevated at times. 11.  Urine drug screen positive marijuana.  Counseled on appropriate 12.  Pre renal azotemia   Creatinine 1.32 on 7/19, labs ordered for tomorrow 13.  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  This will likely occur more often at night.  Monitor for signs of alcohol  withdrawal although drinking pattern was mainly on the weekends. 14.  Sleep disturbance   Trial Seroquel increased to 50 mg nightly with improvement  Improving 15.  Leukocytosis: Resolved   WBCs 10.4 on 7/12 16.  Transaminitis: Resolved 17.  Hyponatremia- resolved  18.  Normochomic anemia  Hemoglobin 12.3 on 7/14, labs ordered for tomorrow  LOS: 25 days A FACE TO FACE EVALUATION WAS PERFORMED  Syriah Delisi Lorie Phenix 01/09/2020, 1:51 PM

## 2020-01-09 NOTE — Plan of Care (Signed)
Pt discussed plans for discharge on 01/12/20. Stated his mother and 3 sisters are nurses and will be supporting him and his wife when he goes home. He stated his wife had gotten 2 blood pressure cuffs to use at home. Patient appears upbeat about going home and continuing outpatient physical therapy.

## 2020-01-09 NOTE — Progress Notes (Signed)
Patient is complaining of left hand pain, feelings like "pins and needles" was asking if there were any alternative medications available. Nurse said she would write note so doctor could address

## 2020-01-10 ENCOUNTER — Inpatient Hospital Stay (HOSPITAL_COMMUNITY): Payer: BC Managed Care – PPO

## 2020-01-10 ENCOUNTER — Inpatient Hospital Stay (HOSPITAL_COMMUNITY): Payer: BC Managed Care – PPO | Admitting: Occupational Therapy

## 2020-01-10 ENCOUNTER — Inpatient Hospital Stay (HOSPITAL_COMMUNITY): Payer: BC Managed Care – PPO | Admitting: Speech Pathology

## 2020-01-10 LAB — BASIC METABOLIC PANEL
Anion gap: 10 (ref 5–15)
BUN: 11 mg/dL (ref 6–20)
CO2: 28 mmol/L (ref 22–32)
Calcium: 9.7 mg/dL (ref 8.9–10.3)
Chloride: 103 mmol/L (ref 98–111)
Creatinine, Ser: 1.27 mg/dL — ABNORMAL HIGH (ref 0.61–1.24)
GFR calc Af Amer: >60 mL/min (ref >60)
GFR calc non Af Amer: >60 mL/min (ref >60)
Glucose, Bld: 92 mg/dL (ref 70–99)
Potassium: 4 mmol/L (ref 3.5–5.1)
Sodium: 141 mmol/L (ref 135–145)

## 2020-01-10 LAB — CBC WITH DIFFERENTIAL/PLATELET
Abs Immature Granulocytes: 0.03 10*3/uL (ref 0.00–0.07)
Basophils Absolute: 0 10*3/uL (ref 0.0–0.1)
Basophils Relative: 0 %
Eosinophils Absolute: 0.1 10*3/uL (ref 0.0–0.5)
Eosinophils Relative: 2 %
HCT: 39 % (ref 39.0–52.0)
Hemoglobin: 12.1 g/dL — ABNORMAL LOW (ref 13.0–17.0)
Immature Granulocytes: 0 %
Lymphocytes Relative: 18 %
Lymphs Abs: 1.2 10*3/uL (ref 0.7–4.0)
MCH: 26.7 pg (ref 26.0–34.0)
MCHC: 31 g/dL (ref 30.0–36.0)
MCV: 85.9 fL (ref 80.0–100.0)
Monocytes Absolute: 0.5 10*3/uL (ref 0.1–1.0)
Monocytes Relative: 8 %
Neutro Abs: 4.8 10*3/uL (ref 1.7–7.7)
Neutrophils Relative %: 72 %
Platelets: 215 10*3/uL (ref 150–400)
RBC: 4.54 MIL/uL (ref 4.22–5.81)
RDW: 13.8 % (ref 11.5–15.5)
WBC: 6.8 10*3/uL (ref 4.0–10.5)
nRBC: 0 % (ref 0.0–0.2)

## 2020-01-10 NOTE — Progress Notes (Signed)
Occupational Therapy Session Note  Patient Details  Name: Dennis Mercado MRN: [id number] Date of Birth: 1981-06-08  Today's Date: 01/10/2020 OT Individual Time: 1300-1358 OT Individual Time Calculation (min): 58 min    Short Term Goals: Week 3:  OT Short Term Goal 1 (Week 3): Pt will complete UB dressing with min assist following hemi dressing techniques. OT Short Term Goal 1 - Progress (Week 3): Met OT Short Term Goal 2 (Week 3): Pt will donn pants and brief with mod assist following hemi techniques for two consecutive sessions. OT Short Term Goal 2 - Progress (Week 3): Met OT Short Term Goal 3 (Week 3): Pt will complete sit to stand with min assist during LB selfcare tasks. OT Short Term Goal 3 - Progress (Week 3): Met OT Short Term Goal 4 (Week 3): Pt will complete toilet transfer with min assist to the 3:1 squat or stand pivot OT Short Term Goal 4 - Progress (Week 3): Not met OT Short Term Goal 5 (Week 3): Pt will sequence through bathing tasks with no more than mod instructional cueing. OT Short Term Goal 5 - Progress (Week 3): Met Week 4:  OT Short Term Goal 1 (Week 4): Continue working on established LTGs set at min assist overall.  Skilled Therapeutic Interventions/Progress Updates:    Patient seated in w/c, alert and ready for afternoon therapy session.. he requests to take a shower at this time.  Sit pivot transfer to/from toilet, w/c, shower bench and bed with min A - min cues for set up of w/c and positioning.  toileting completed with CGA/min A to steady and pants up.  Shower completed seated on shower bench, hand held shower - overall min A for thoroughness with left lower leg, right arm and CGA for standing so that he could complete groin and buttocks.  Dressing completed seated in w/c - OH shirt with min A and min cues.  LB dressing mod A for pants over feet.  Min A for CM in stance.  Dependent for teds, set up right shoe, max A left shoe.  Returned to bed due to fatigue.  Min  A for sit to supine - note significantly improved motor control of left LE into bed this session.  Completed supine left UE motor control activities - able to facilitate OH reach and elbow extension - he was able to inhibit IR and lats with cues and techniques - reviewed inhibitory methods for tight hand that he was able to carryover.  He remained in bed at close of session with bed alarm set and call bell in reach.    Therapy Documentation Precautions:  Precautions Precautions: Fall Precaution Comments: L UE/LE severe paresis with impaired sensation; impaired safety awareness Restrictions Weight Bearing Restrictions: No   Therapy/Group: Individual Therapy  Carlos Levering 01/10/2020, 7:40 AM

## 2020-01-10 NOTE — Discharge Summary (Signed)
Physician Discharge Summary  Patient ID: Dennis Mercado MRN: [id number] DOB/AGE: [birth year] y.o.  Admit date: 12/15/2019 Discharge date: 01/12/2020  Discharge Diagnoses:  Principal Problem:   ICH (intracerebral hemorrhage) ([place]) Active Problems:   Prerenal azotemia   Sleep disturbance   Leukocytosis   Transaminitis   Hyponatremia   Acute blood loss anemia   Hypotension due to drugs   Vascular headache   Benign essential HTN   Normocytic anemia DVT prophylaxis  Discharged Condition: Stable  Significant Diagnostic Studies: DG Chest 2 View  Result Date: 12/16/2019 CLINICAL DATA:  Fever EXAM: CHEST - 2 VIEW COMPARISON:  12/11/2019 FINDINGS: The heart size and mediastinal contours are within normal limits. Both lungs are clear. The visualized skeletal structures are unremarkable. IMPRESSION: No active cardiopulmonary disease. Electronically Signed   By: Rolm Baptise M.D.   On: 12/16/2019 16:19   CT HEAD WO CONTRAST  Result Date: 12/30/2019 CLINICAL DATA:  Decreased cognition. The patient suffered a intracranial hemorrhage 12/06/2019. EXAM: CT HEAD WITHOUT CONTRAST TECHNIQUE: Contiguous axial images were obtained from the base of the skull through the vertex without intravenous contrast. COMPARISON:  Head CT 12/06/2019 and 12/11/2019. FINDINGS: Brain: There has been continued evolution of the patient's right thalamic hemorrhage which demonstrates decreased attenuation compared to the most recent exam. No new hemorrhage is identified and there is no midline shift, mass or new infarct. Mild dilatation of the ventricular system seen on the most recent examination has decreased and intraventricular hemorrhage has resolved. Vascular: No hyperdense vessel or unexpected calcification. Skull: Intact.  No focal lesion. Sinuses/Orbits: Negative. Other: None. IMPRESSION: Continued evolution of the patient's right thalamic hemorrhage. No new abnormality. Intraventricular hemorrhage seen on the most  recent CT has resolved and mild hydrocephalus has improved. Electronically Signed   By: Inge Rise M.D.   On: 12/30/2019 10:46   VAS [place] LOWER EXTREMITY VENOUS (DVT)  Result Date: 12/17/2019  Lower Venous DVTStudy Indications: Swelling.  Performing Technologist: Abram Sander RVS  Examination Guidelines: A complete evaluation includes B-mode imaging, spectral Doppler, color Doppler, and power Doppler as needed of all accessible portions of each vessel. Bilateral testing is considered an integral part of a complete examination. Limited examinations for reoccurring indications may be performed as noted. The reflux portion of the exam is performed with the patient in reverse Trendelenburg.  +---------+---------------+---------+-----------+----------+--------------+ RIGHT    CompressibilityPhasicitySpontaneityPropertiesThrombus Aging +---------+---------------+---------+-----------+----------+--------------+ CFV      Full           Yes      Yes                                 +---------+---------------+---------+-----------+----------+--------------+ SFJ      Full                                                        +---------+---------------+---------+-----------+----------+--------------+ FV Prox  Full                                                        +---------+---------------+---------+-----------+----------+--------------+ FV Mid   Full                                                        +---------+---------------+---------+-----------+----------+--------------+  FV DistalFull                                                        +---------+---------------+---------+-----------+----------+--------------+ PFV      Full                                                        +---------+---------------+---------+-----------+----------+--------------+ POP      Full           Yes      Yes                                  +---------+---------------+---------+-----------+----------+--------------+ PTV      Full                                                        +---------+---------------+---------+-----------+----------+--------------+ PERO     Full                                                        +---------+---------------+---------+-----------+----------+--------------+   +---------+---------------+---------+-----------+----------+--------------+ LEFT     CompressibilityPhasicitySpontaneityPropertiesThrombus Aging +---------+---------------+---------+-----------+----------+--------------+ CFV      Full           Yes      Yes                                 +---------+---------------+---------+-----------+----------+--------------+ SFJ      Full                                                        +---------+---------------+---------+-----------+----------+--------------+ FV Prox  Full                                                        +---------+---------------+---------+-----------+----------+--------------+ FV Mid   Full                                                        +---------+---------------+---------+-----------+----------+--------------+ FV DistalFull                                                        +---------+---------------+---------+-----------+----------+--------------+  PFV      Full                                                        +---------+---------------+---------+-----------+----------+--------------+ POP      Full           Yes      Yes                                 +---------+---------------+---------+-----------+----------+--------------+ PTV      Full                                                        +---------+---------------+---------+-----------+----------+--------------+ PERO     Full                                                         +---------+---------------+---------+-----------+----------+--------------+     Summary: BILATERAL: - No evidence of deep vein thrombosis seen in the lower extremities, bilaterally. -No evidence of popliteal cyst, bilaterally.   *See table(s) above for measurements and observations. Electronically signed by Monica Martinez MD on 12/17/2019 at 6:29:48 PM.    Final     Labs:  Basic Metabolic Panel: Recent Labs  Lab 01/10/20 0610  NA 141  K 4.0  CL 103  CO2 28  GLUCOSE 92  BUN 11  CREATININE 1.27*  CALCIUM 9.7    CBC: Recent Labs  Lab 01/10/20 0610  WBC 6.8  NEUTROABS 4.8  HGB 12.1*  HCT 39.0  MCV 85.9  PLT 215    CBG: No results for input(s): GLUCAP in the last 168 hours.  Family history positive for hypertension and hyperlipidemia.  Denies any colon cancer esophageal cancer rectal cancer  Brief HPI:   Dennis Mercado is a 39 y.o. right-handed male with documented history of hypertension on no current antihypertensive medications.  History taken from chart and wife due to cognition.  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  1 level home one-step to entry.  Presented 12/06/2019 the left side weakness altered mental status during sexual activity.  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  Per report measuring 3.6 x 2.8 x 2.6 cm.  Hemorrhage extending into the ventricles and could be seen in the third and fourth ventricles without hydrocephalus.  Local mass-effect without midline shift.  CT angiogram of head and neck normal variant.  CTA circle of Willis without significant proximal stenosis aneurysm or branch vessel occlusion.  Admission chemistries potassium 3.2 creatinine 1.52 urine drug screen positive marijuana.  Echocardiogram with ejection fraction of 65% no wall motion abnormalities.  EEG negative for seizure.  Carotid Dopplers unremarkable.  Venous Doppler studies lower extremities negative for DVT.  Follow-up cranial CT scan  stable.  Unchanged size of right thalamic intraparenchymal hematoma with mild surrounding edema.  Hospital course complicated by fevers presumed to be secondary to large left lung opacity concerning  for pneumonia he completed a course of Unasyn.  Tolerating a dysphagia #2 thin liquid diet.  Cleviprex initially ongoing for blood pressure control.  He was cleared to begin subcutaneous heparin for DVT prophylaxis 12/11/2019.  Therapy evaluations completed and patient was admitted for a comprehensive rehab program   Hospital Course: Dennis Mercado was admitted to rehab 12/15/2019 for inpatient therapies to consist of PT, ST and OT at least three hours five days a week. Past admission physiatrist, therapy team and rehab RN have worked together to provide customized collaborative inpatient rehab.  Pertaining to patient's right thalamic ICH felt to be related to hypertensive crisis remained stable follow-up repeat CT scan evolution of right thalamic hemorrhage.  Patient would follow-up neurology services.  Patient remained on Keppra for seizure prophylaxis.  He had been cleared to begin subcutaneous heparin for DVT prophylaxis 12/11/2019 no bleeding episodes venous Doppler studies negative.  Initial bouts of headaches placed on Topamax later discontinued due to some increased confusion headaches did improve.  His diet was advanced to regular consistency.  Mood stabilization with the addition of Seroquel as well as melatonin with good results.  Blood pressures monitored on Norvasc 10 mg daily, Coreg 25 mg twice daily, clonidine 0.1 mg twice daily as well as lisinopril 5 mg daily.  Patient would need follow-up with primary MD.  He had completed a course of Unasyn for left lung pneumonia he remained afebrile.  Lipitor ongoing for hyperlipidemia.  Urine drug screen was positive for marijuana patient and wife did receive counts regards to cessation of alcohol smoking or illicit drug use.  Close monitoring of renal function  latest creatinine 1.33 and again patient would need follow-up with primary MD.   Blood pressures were monitored on TID basis and controlled    Rehab course: During patient's stay in rehab weekly team conferences were held to monitor patient's progress, set goals and discuss barriers to discharge. At admission, patient required +2 physical assist sit to stand max assist supine to sit moderate assist side-lying to sitting.  Total assist upper body bathing total assist lower body bathing total assist upper body dressing total assist lower body dressing  Physical exam.  Blood pressure 107/58 pulse 79 temperature 97.8 respirations 20 oxygen saturation 1% room air Constitutional no acute distress HEENT Head.  Normocephalic and atraumatic Eyes.  Pupils round and reactive to light no discharge.nystagmus Neck.  Supple nontender no JVD without thyromegaly Cardiac regular rate rhythm without any extra sounds or murmur heard Abdomen.  Soft nontender positive bowel sounds without rebound Respiratory effort normal no respiratory distress without wheeze Neurological.  Patient was alert somewhat distracted he did provide his name and age.  Follows simple commands Motor.  Right upper extremity right lower extremity 5/5 proximal distal Left upper extremity left lower extremity 0 out of 5 proximal to distal Sensation diminished to light touch on the left  He/  has had improvement in activity tolerance, balance, postural control as well as ability to compensate for deficits. He/ has had improvement in functional use RUE/LUE  and RLE/LLE as well as improvement in awareness.  Supine to sit transfers with minimal assist to bring left lower extremity to the right side of bed.  Patient applied right shoe and left AFO shoe while patient sitting edge of bed with supervision assist.  Sit to stand from edge of bed with minimal assist and right upper extremity supported.  Stand pivot transfer to wheelchair with  contact-guard assist.  Wheelchair mobility with hemitechnique 100  feet supervision.  Gait training in the rehab gym with left AFO left hand splint rolling walker 30 feet x 2 with moderate assist.  Completed bed mobility with minimal assist.  Minimal assist stand pivot to wheelchair tub transfer bench.  Needed some verbal cues for positioning.  Dressing completed from wheelchair to sink with minimal assist to thread left lower extremity to pants and minimal assist for balance.  Patient was able to communicate his needs.  Full family teaching completed plan discharge to home       Disposition: Discharge to home    Diet: Regular  Special Instructions: No driving smoking alcohol or illicit drug use  Medications at discharge 1.  Tylenol as needed 2.  Norvasc 10 mg p.o. daily 3.  Lipitor 40 mg p.o. daily 4.  Coreg 25 mg p.o. twice daily 5.  Clonidine 0.1 mg p.o. twice daily 6.  Keppra 500 mg p.o. twice daily 7.  Lidoderm patch change as directed 8.  Lisinopril 5 mg daily 9.  Melatonin 3 mg p.o. nightly 10.  Protonix 40 mg p.o. daily 11.  Seroquel 50 mg p.o. nightly  30-35 minutes were spent completing discharge summary and discharge planning  Discharge Instructions    Ambulatory Referral to Neuro Rehab   Complete by: As directed    Eval and treat PT/OT SLP   Ambulatory referral to Neurology   Complete by: As directed    An appointment is requested in approximately 4 weeks right thalamic [place]   Ambulatory referral to Physical Medicine Rehab   Complete by: As directed    Moderate complexity follow-up 1 to 2 weeks ICH related to hypertensive crisis       Follow-up Information    Kirsteins, [name], MD Follow up.   Specialty: Physical Medicine and Rehabilitation Why: Office to call for appointment Contact information: [place] [address] [phone]               Signed: Cathlyn Parsons 01/12/2020, 5:03 AM

## 2020-01-10 NOTE — Progress Notes (Signed)
Physical Therapy Session Note  Patient Details  Name: Dennis Mercado MRN: [id number] Date of Birth: 07/05/1980  Today's Date: 01/10/2020 PT Individual Time: 1005-1102 PT Individual Time Calculation (min): 57 min   Short Term Goals: Week 4:  PT Short Term Goal 1 (Week 4): = to LTGS based on ELOS  Skilled Therapeutic Interventions/Progress Updates:     Patient in w/c in his room with B shoes and L GRFAFOupon PT arrival. Patient alert and agreeable to PT session. Patient denied pain during session. Jason, ATP, from [facility] present for w/c evaluation 20 min during session.  Therapeutic Activity: Transfers: Patient performed sit to/from stand x3 with min A using RW with L hand orthosis. Provided verbal cues for hand placement and reaching back to control descent for safety.  Gait Training:  Patient ambulated 25 feet using RW, L hand orthosis, and L AFO with min A and w/c follow for safety. Ambulated with step-to gait pattern leading with L with decreased L foot clearance, increased L toe out, and decreased L weight shift, and narrow BOS. Provided verbal cues for sequencing, min A for stabilizing and steering RW, intermittent facilitation of L foot placement, and cues for initiation of swing on L. He then ambulated 25 ft without the L AFO to focus on DF activation and L knee stability in stance. Ambulated with increased L knee flexion in stance, and decreased foot clearance due to decreased DF activation. Then applied DF wrap to focus on L knee stability in stance phase x45 feet with 1 person providing trunk support and facilitation of weight shift and 1 person providing PNF tapping for increased L quad activation in stance while blocking L knee to cue knee extension.   Wheelchair Mobility:  Discussed w/c mobility and patient's current progress with mobility with ATP. Per discussion, ATP suggested that patient wait for a custom w/c evaluation due to current progress with mobility. Recommended  patient follow-up for custom w/c once progress plateaus for appropriate w/c needs to be met. Recommending patient rent a standard light weight w/c, size 16"x16" with hard shell back, L lap tray, and removable arm rests at this time. Also, PT recommending patient have a RW to continue progress with gait at next level of care.   Patient in w/c in the room at end of session with breaks locked, seat belt alarm set, and all needs within reach.    Therapy Documentation Precautions:  Precautions Precautions: Fall Precaution Comments: L UE/LE severe paresis with impaired sensation; impaired safety awareness Restrictions Weight Bearing Restrictions: No    Therapy/Group: Individual Therapy  Tiane Szydlowski L Zamere Pasternak PT, DPT  01/10/2020, 5:29 PM

## 2020-01-10 NOTE — Progress Notes (Signed)
Physical Therapy Session Note  Patient Details  Name: Dennis Mercado MRN: [id number] Date of Birth: 19-Sep-1980  Today's Date: 01/10/2020 PT Individual Time: 0830-0900 PT Individual Time Calculation (min): 30 min   Short Term Goals: Week 4:  PT Short Term Goal 1 (Week 4): = to LTGS based on ELOS  Skilled Therapeutic Interventions/Progress Updates:    Focused on issuing HEP to address core and targeted LLE strength, coordination, and motor control with active assist with LLE including the following with handout issued:  Access Code: [email or web address] URL: [email or web address] Date: 01/10/2020 Prepared by: Bryson Ha  Exercises Supine Bridge - 1-2 x daily - 3 sets - 10 reps - 5-10 sec hold Supine March - 1-2 x daily - 3 sets - 10 reps Supine Heel Slide - 1-2 x daily - 3 sets - 10 reps Supine Hip Abduction - 1-2 x daily - 3 sets - 10 reps Seated Long Arc Quad - 1-2 x daily - 3 sets - 10 reps  Pt performed bed mobility with extra time and CGA to assist with LLE management and cues for positioning and attention to LUE placement as well as squaring up body once EOB. Assisted with donning of shoes and AFO for time management with total assist. CGA for sit <> stand with RW with focus on attention to management of LUE on and off orthosis with assist needed. Mod assist for gait with NMR to address activation and positioning of LLE, weightshifting  as well as tactile and verbal cues for quad activation on L in stance phase. Increased difficulty with turning and requiring physical assist with positioning of RW during turns and multimodal cues for LLE management. 10' x 2 during session within room to simulate home environment mobility.    Therapy Documentation Precautions:  Precautions Precautions: Fall Precaution Comments: L UE/LE severe paresis with impaired sensation; impaired safety awareness Restrictions Weight Bearing Restrictions: No  Pain: Denies pain.    Therapy/Group:  Individual Therapy  Canary Brim Ivory Broad, PT, DPT, CBIS  01/10/2020, 9:36 AM

## 2020-01-10 NOTE — Progress Notes (Addendum)
Speech Language Pathology Daily Session Note  Patient Details  Name: Dennis Mercado MRN: [id number] Date of Birth: August 10, 1980  Today's Date: 01/10/2020 SLP Individual Time: 1432-1501 SLP Individual Time Calculation (min): 29 min  Short Term Goals: Week 4: SLP Short Term Goal 1 (Week 4): STG=LTG due to remaining LOS  Skilled Therapeutic Interventions: Pt was seen for skilled ST targeting cognition. SLP facilitated session with Min A verbal cues for complex problem solving during a deductive reasoning task. Pt demonstrated recall from earlier sessions Mod I, and anticipated equipment needs for d/c with Supervision A question cues. He sustained attention to tasks for entire session with Supervision A verbal cues for redirection. Pt is making excellent progress toward meeting cognitive goals for upcoming d/c this week. Pt left laying in bed with alarm set and needs within reach. Continue per current plan of care.          Pain Pain Assessment Pain Scale: 0-10 Pain Score: 0-No pain  Therapy/Group: Individual Therapy  Arbutus Leas 01/10/2020, 7:29 AM

## 2020-01-10 NOTE — Progress Notes (Signed)
[place] PHYSICAL MEDICINE & REHABILITATION PROGRESS NOTE   Subjective/Complaints: No issues overnite, discussed d/c planning, no bowel or bladder issues reported by pt , flow sheet confirm   ROS: Denies CP, SOB, N/V/D  Objective:   No results found. Recent Labs    01/10/20 0610  WBC 6.8  HGB 12.1*  HCT 39.0  PLT 215   Recent Labs    01/10/20 0610  NA 141  K 4.0  CL 103  CO2 28  GLUCOSE 92  BUN 11  CREATININE 1.27*  CALCIUM 9.7    Intake/Output Summary (Last 24 hours) at 01/10/2020 1116 Last data filed at 01/10/2020 0757 Gross per 24 hour  Intake 1100 ml  Output --  Net 1100 ml     Physical Exam: Vital Signs Blood pressure (!) 116/93, pulse 73, temperature 97.9 F (36.6 C), resp. rate 16, height 5\' 7"  (1.702 m), weight 71 kg, SpO2 100 %. Constitutional: No distress . Vital signs reviewed. HENT: Normocephalic.  Atraumatic. Eyes: EOMI. No discharge. Cardiovascular: No JVD. Respiratory: Normal effort.  No stridor. GI: Non-distended. Skin: Warm and dry.  Intact. Psych: Normal mood.  Normal behavior. Musc: No edema in extremities.  No tenderness in extremities. Neuro: Alert Motor: LUE/LLE: 0/5 proximal distal, unchanged Increased tone noted in left upper extremity/left lower extremity Left wrist and ankle clonus ongoing.  Assessment/Plan: 1. 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  Physiatrist is providing close team supervision and 24 hour management of active medical problems listed below.  Physiatrist and rehab team continue to assess barriers to discharge/monitor patient progress toward functional and medical goals  Care Tool:  Bathing    Body parts bathed by patient: Left arm, Chest, Abdomen, Front perineal area, Buttocks, Right upper leg, Left upper leg, Right lower leg, Left lower leg, Face   Body parts bathed by helper: Right  arm     Bathing assist Assist Level: Moderate Assistance - Patient 50 - 74%     Upper Body Dressing/Undressing Upper body dressing Upper body dressing/undressing activity did not occur (including orthotics): N/A What is the patient wearing?: Pull over shirt    Upper body assist Assist Level: Moderate Assistance - Patient 50 - 74%    Lower Body Dressing/Undressing Lower body dressing      What is the patient wearing?: Pants, Incontinence brief     Lower body assist Assist for lower body dressing: Moderate Assistance - Patient 50 - 74%     Toileting Toileting    Toileting assist Assist for toileting: Set up assist Assistive Device Comment: urinal (assist placing urinal, pt will hold)   Transfers Chair/bed transfer  Transfers assist  Chair/bed transfer activity did not occur: N/A  Chair/bed transfer assist level: Minimal Assistance - Patient > 75%     Locomotion Ambulation   Ambulation assist   Ambulation activity did not occur: Safety/medical concerns  Assist level: Moderate Assistance - Patient 50 - 74% Assistive device: Walker-rolling (AFO) Max distance: 20'   Walk 10 feet activity   Assist  Walk 10 feet activity did not occur: Safety/medical concerns  Assist level: Moderate Assistance - Patient - 50 - 74% Assistive device: Walker-rolling, Orthosis   Walk 50 feet activity   Assist Walk 50 feet with 2 turns activity did not occur: Safety/medical concerns (not yet performing turns)  Assist level: 2 helpers Assistive device: Lite Gait    Walk 150 feet activity  Assist Walk 150 feet activity did not occur: Safety/medical concerns         Walk 10 feet on uneven surface  activity   Assist Walk 10 feet on uneven surfaces activity did not occur: Safety/medical concerns         Wheelchair     Assist Will patient use wheelchair at discharge?: No (No PT lomg term goals set)             Wheelchair 50 feet with 2 turns  activity    Assist            Wheelchair 150 feet activity     Assist          Blood pressure (!) 116/93, pulse 73, temperature 97.9 F (36.6 C), resp. rate 16, height 5\' 7"  (1.702 m), weight 71 kg, SpO2 100 %.  Medical Problem List and Plan: 1.  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  Antithrombotics: -DVT/anticoagulation: Subcutaneous heparin initiated 12/11/2019,dopplers neg              -antiplatelet therapy: N/A 3. Pain Management: Tylenol as needed,    Topirimate 25mg  BID, DC'd due to confusion  Controlled on 7/26 4. Mood: Provide emotional support             -antipsychotic agents: N/A 5. Neuropsych: This patient is not fully capable of making complex decisions on his own behalf. 6. Skin/Wound Care: Routine skin checks 7. Fluids/Electrolytes/Nutrition: Routine in and outs. 8.  Post stroke dysphagia.    Regular textured thins with accommodations.  Follow-up speech therapy.  Advance diet as tolerated 9.  Left lung pneumonia.  Intravenous Unasyn completed 10.  Hypertension.  Norvasc 10 mg daily, Coreg 25 mg twice daily, clonidine 0.3 mg twice daily  Hydralazine 100 mg every 8 hours, d/ced on 7/10  Lisinopril 10 mg twice daily, decreased to 5 twice daily on 7/11, decreased to daily on 7/14.   Vitals:   01/09/20 1312 01/10/20 0411  BP: 109/83 (!) 116/93  Pulse: 85 73  Resp: 17 16  Temp: 98.8 F (37.1 C) 97.9 F (36.6 C)  SpO2: 100% 100%   Clonidine dose down to 1mg  BID             Relatively controlled on 4/68, diastolic blood pressure elevated at times. 11.  Urine drug screen positive marijuana.  Counseled on appropriate 12.  Pre renal azotemia   Creatinine 1.32 on 7/19, labs ordered for tomorrow 13.  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  This will likely  occur more often at night.  Monitor for signs of alcohol withdrawal although drinking pattern was mainly on the weekends. 14.  Sleep disturbance   Trial Seroquel increased to 50 mg nightly with improvement  Improving 15.  Leukocytosis: Resolved   WBCs 10.4 on 7/12 16.  Transaminitis: Resolved 17.  Hyponatremia- resolved  18.  Normochomic anemia  Hemoglobin 12.3 on 7/14, labs ordered for tomorrow  LOS: 26 days A FACE TO [place] E Carmin Dibartolo 01/10/2020, 11:16 AM

## 2020-01-10 NOTE — Progress Notes (Signed)
Speech Language Pathology Discharge Summary  Patient Details  Name: Dennis Mercado MRN: [id number] Date of Birth: 17-Apr-1981  Today's Date: 01/11/2020 SLP Individual Time: 0817-0859 SLP Individual Time Calculation (min): 42 min   Skilled Therapeutic Interventions:  Pt was seen for skilled ST targeting cognitive goals. SLP facilitated session with re-administration of MOCA Basic 7.1 to assess progress on standardized testing since admission. He scored 26/30 today (WNL = 26 or >), which reflects an 8 point increase. He required Supervision A level verbal cues for use of a problem solving strategy on calculations subtest. Primary deficits noted in executive functioning, problem solving, and mild short term memory. Pt left laying in bed with alarm set and needs within reach. Continue per current plan of care.       Patient has met 7 of 7 long term goals.  Patient to discharge at overall Supervision;Modified Independent level.  Reasons goals not met: n/a   Clinical Impression/Discharge Summary:   Pt made excellent functional gains and met 7 out of 7 long term goals this admission. Pt currently requires Supervision assist for mildly complex cognitive tasks and due to now mild impairments in short term recall, problem solving, attention, and awareness. As a results, he will require 24/7 supervision at discharge. Pt is consuming upgraded regular texture diet with thin liquids and is Mod I (exceeded Supervision A level goal) for use of compensatory strategies and self-monitoring use of them for oral clearance of left buccal pocketing. Pt has demonstrated improved sustained attention, basic and complex problem solving, awareness of functional errors, and recall and carryover over of new/daily information . Given mild cognitive deficits still present, recommend pt continue to receive skilled ST services upon discharge. Pt and family education is complete at this time.    Care Partner:  Caregiver Able to  Provide Assistance: Yes  Type of Caregiver Assistance: Cognitive  Recommendation:  24 hour supervision/assistance;Outpatient SLP  Rationale for SLP Follow Up: Maximize cognitive function and independence;Reduce caregiver burden   Equipment: none   Reasons for discharge: Discharged from hospital   Patient/Family Agrees with Progress Made and Goals Achieved: Yes    Arbutus Leas 01/11/2020, 8:31 AM

## 2020-01-11 ENCOUNTER — Inpatient Hospital Stay (HOSPITAL_COMMUNITY): Payer: BC Managed Care – PPO | Admitting: Physical Therapy

## 2020-01-11 ENCOUNTER — Inpatient Hospital Stay (HOSPITAL_COMMUNITY): Payer: BC Managed Care – PPO | Admitting: Speech Pathology

## 2020-01-11 ENCOUNTER — Inpatient Hospital Stay (HOSPITAL_COMMUNITY): Payer: BC Managed Care – PPO | Admitting: Occupational Therapy

## 2020-01-11 MED ORDER — MELATONIN 3 MG PO TABS: 3.0000 mg | ORAL_TABLET | Freq: Every day | ORAL | 0 refills | Status: DC

## 2020-01-11 MED ORDER — LISINOPRIL 5 MG PO TABS: 5.0000 mg | ORAL_TABLET | Freq: Every day | ORAL | 0 refills | Status: DC

## 2020-01-11 MED ORDER — CARVEDILOL 25 MG PO TABS: 25.0000 mg | ORAL_TABLET | Freq: Two times a day (BID) | ORAL | 0 refills | Status: DC

## 2020-01-11 MED ORDER — QUETIAPINE FUMARATE 50 MG PO TABS: 50.0000 mg | ORAL_TABLET | Freq: Every day | ORAL | 0 refills | Status: DC

## 2020-01-11 MED ORDER — PANTOPRAZOLE SODIUM 40 MG PO TBEC: 40.0000 mg | DELAYED_RELEASE_TABLET | Freq: Every day | ORAL | 0 refills | Status: DC

## 2020-01-11 MED ORDER — ATORVASTATIN CALCIUM 40 MG PO TABS: 40.0000 mg | ORAL_TABLET | Freq: Every day | ORAL | 0 refills | Status: DC

## 2020-01-11 MED ORDER — LEVETIRACETAM 500 MG PO TABS: 500.0000 mg | ORAL_TABLET | Freq: Two times a day (BID) | ORAL | 0 refills | Status: DC

## 2020-01-11 MED ORDER — LIDOCAINE 5 % EX PTCH: 1.0000 | MEDICATED_PATCH | CUTANEOUS | 0 refills | Status: DC

## 2020-01-11 MED ORDER — AMLODIPINE BESYLATE 10 MG PO TABS: 10.0000 mg | ORAL_TABLET | Freq: Every day | ORAL | 0 refills | Status: DC

## 2020-01-11 MED ORDER — ACETAMINOPHEN 325 MG PO TABS: 650.0000 mg | ORAL_TABLET | ORAL | Status: DC | PRN

## 2020-01-11 MED ORDER — CLONIDINE HCL 0.1 MG PO TABS: 0.1000 mg | ORAL_TABLET | Freq: Two times a day (BID) | ORAL | 11 refills | Status: DC

## 2020-01-11 NOTE — Progress Notes (Signed)
Patient ID: Dennis Mercado, male   DOB: January 30, 1981, 39 y.o.   MRN: [id number]  This SW covering for primary SW, Erlene Quan.   SW faxed DME order: w/c, DABSC, and TTB to [employer] (p:[phone]/f:[phone]).  SW left message for pt wife April ([phone]) to provide updates on DME needed, and discuss outpatient preference.  *SW received return phone call from pt wife. SW informed on above DME company, and TTB not covered under insurance. She is aware vendor will follow-up to discuss co-pays. Preferred outpatient location is [facility]. No further questions/concerns reported.   SW faxed referral for outpatient PT/OT/SLP ([phone]/f:[phone]).  Loralee Pacas, MSW, [place] Office: [phone] Cell: [phone] Fax: [phone]

## 2020-01-11 NOTE — Progress Notes (Signed)
Orthopedic Tech Progress Note Patient Details:  Dennis Mercado 03/24/1981 [id number] Ordered Outside Vendor Brace Patient ID: Kimothy Kishimoto, male   DOB: 1980-11-23, 39 y.o.   MRN: [id number]   Tammy Sours 01/11/2020, 10:28 AM

## 2020-01-11 NOTE — Progress Notes (Signed)
Physical Therapy Discharge Summary  Patient Details  Name: Dennis Mercado MRN: [id number] Date of Birth: [birth year]  Today's Date: 01/11/2020 PT Individual [phone]   22mn    Patient has met 11 of 11 long term goals due to improved activity tolerance, improved balance, improved postural control, increased strength, increased range of motion, decreased pain, ability to compensate for deficits, functional use of  left lower extremity, improved attention, improved awareness and improved coordination.  Patient to discharge at a wheelchair level MSan Pedro   Patient's care partner is independent to provide the necessary physical and cognitive assistance at discharge.  Reasons goals not met: All PT goals met   Recommendation:  Patient will benefit from ongoing skilled PT services in outpatient setting to continue to advance safe functional mobility, address ongoing impairments in balance, safety, tranfers, gait, community access, and minimize fall risk.  Equipment: WC, AFO  Reasons for discharge: treatment goals met and discharge from hospital  Patient/family agrees with progress made and goals achieved: Yes   PT treatment PT instructed pt in Grad day assessment to measure progress toward goals. See below for details. Transfers completed with CGA for sit<>stand and stand pivot throughout session. Gait training with AFO and RW x 590fwith min assist. Supervision assist WC mobiltiy x [phone] hall. Car transfer training with min assist for safety using modified stand pivot to small SUV height. Pt returned to room and performed stand pivot transfer to bed with CGA. Sit>supine completed withCGA for LLE management and left supine in bed with call bell in reach and all needs met.     PT Discharge Precautions/Restrictions Restrictions Weight Bearing Restrictions: No Vital Signs Therapy Vitals BP: (!) 130/100 Pain Pain Assessment Pain Scale: 0-10 Pain Score: 6  Pain Type: Acute  pain Pain Location: Wrist Pain Orientation: Left Pain Descriptors / Indicators: Aching Pain Onset: Gradual Pain Intervention(s): Medication (See eMAR)  Cognition Orientation Level: Oriented X4 Sensation Sensation Light Touch: Impaired Detail Light Touch Impaired Details: Absent LLE Hot/Cold: Not tested Proprioception: Impaired Detail Proprioception Impaired Details: Absent LLE Stereognosis: Not tested Additional Comments: Pt able to detect deep pressure, but no light touch appreciation Coordination Gross Motor Movements are Fluid and Coordinated: No Fine Motor Movements are Fluid and Coordinated: No Coordination and Movement Description: L side hemiplegia UE>LE Heel Shin Test: unable with L LE; WNL with R LE Motor  Motor Motor: Hemiplegia;Abnormal postural alignment and control;Abnormal tone Motor - Discharge Observations: L sided hemiplegia, UE>LE, improved from Eval  Mobility Bed Mobility Bed Mobility: Supine to Sit;Rolling Right;Rolling Left;Sit to Supine Rolling Right: Contact Guard/Touching assist Rolling Left: Contact Guard/Touching assist Supine to Sit: Contact Guard/Touching assist Sit to Supine: Contact Guard/Touching assist Transfers Transfers: Sit to Stand;Stand Pivot Transfers;Squat Pivot Transfers Sit to Stand: Contact Guard/Touching assist Stand to Sit: Contact Guard/Touching assist Stand Pivot Transfers: Contact Guard/Touching assist Transfer (Assistive device): Other (Comment) (AFO) Locomotion  Gait Ambulation: Yes Gait Assistance: Minimal Assistance - Patient > 75% Assistive device: Rolling walker;Other (Comment) (AFO/L hand splint) Gait Assistance Details: Visual cues for safe use of DME/AE;Visual cues/gestures for precautions/safety;Visual cues/gestures for sequencing;Verbal cues for sequencing;Verbal cues for technique;Verbal cues for precautions/safety;Verbal cues for gait pattern;Verbal cues for safe use of DME/AE Gait Gait: Yes Gait Pattern:  Impaired Gait Pattern: Left steppage;Left circumduction;Left hip hike;Narrow base of support;Poor foot clearance - left Stairs / Additional Locomotion Stairs: Yes Stairs Assistance: Moderate Assistance - Patient 50 - 74%;Minimal Assistance - Patient > 75% Stair Management Technique: One rail Right Number of  Stairs: 4 Height of Stairs: 6 Architect: Yes Wheelchair Assistance: Chartered loss adjuster: Right upper extremity;Right lower extremity Wheelchair Parts Management: Supervision/cueing Distance: 150  Trunk/Postural Assessment  Postural Control Trunk Control: able to achieve symmetrical seated posture with cues, tends to rotate trunk and drop left shoulder in seated position.  Balance Static Sitting Balance Static Sitting - Level of Assistance: 6: Modified independent (Device/Increase time) Dynamic Sitting Balance Dynamic Sitting - Level of Assistance: 5: Stand by assistance Static Standing Balance Static Standing - Level of Assistance: 4: Min assist Dynamic Standing Balance Dynamic Standing - Level of Assistance: 4: Min assist Extremity Assessment      RLE Assessment RLE Assessment: Within Functional Limits General Strength Comments: Grossly 5/5 throughout LLE Assessment LLE Assessment: Exceptions to [facility] Passive Range of Motion (PROM) Comments: Limited ROM noted performing the following movements: hip abduction, hip flexion, and hip flexion with knee extended LLE Strength Left Hip Flexion: 2+/5 Left Hip Extension: 3-/5 Left Hip ABduction: 2+/5 Left Hip ADduction: 3+/5 Left Knee Flexion: 3-/5 Left Knee Extension: 2+/5 Left Ankle Dorsiflexion: 1/5 Left Ankle Plantar Flexion: 1/5 LLE Tone LLE Tone: Hypertonic;Mild Hypertonic Details: mild tone    Lorie Phenix 01/11/2020, 8:04 AM

## 2020-01-11 NOTE — Plan of Care (Signed)
  Problem: RH BLADDER ELIMINATION Goal: RH STG MANAGE BLADDER WITH ASSISTANCE Description: STG Manage Bladder With min Assistance Outcome: Progressing   Problem: RH SKIN INTEGRITY Goal: RH STG SKIN FREE OF INFECTION/BREAKDOWN Description: Pt will be free of skin breakdown/infection with min assist during CIR stay Outcome: Progressing   Problem: RH SAFETY Goal: RH STG ADHERE TO SAFETY PRECAUTIONS W/ASSISTANCE/DEVICE Description: STG Adhere to Safety Precautions With cues/reminders Assistance/Device. Outcome: Progressing   Problem: RH KNOWLEDGE DEFICIT Goal: RH STG INCREASE KNOWLEDGE OF HYPERTENSION Description: Pt/family will be able to demonstrate knowledge of HTN management with medications and diet precautions and ways to monitor blood pressure with min assist using handouts/booklets provided by staff Outcome: Progressing Goal: RH STG INCREASE KNOWLEGDE OF HYPERLIPIDEMIA Description: Pt/family will be able to demonstrate knowledge of HLD management with medications and diet precautions with min assist using handouts/booklets provided by staff Outcome: Progressing Goal: RH STG INCREASE KNOWLEDGE OF STROKE PROPHYLAXIS Description: Pt/family will be able to demonstrate knowledge of stroke prevention with medications and diet precautions and ways to monitor signs/symptoms of stroke with min assist using handouts/booklets provided by staff Outcome: Progressing

## 2020-01-11 NOTE — Progress Notes (Addendum)
[place] PHYSICAL MEDICINE & REHABILITATION PROGRESS NOTE   Subjective/Complaints:  No issues overnite Very limited ambulation with therapy, will need WC for discharge  ROS: Denies CP, SOB, N/V/D  Objective:   No results found. Recent Labs    01/10/20 0610  WBC 6.8  HGB 12.1*  HCT 39.0  PLT 215   Recent Labs    01/10/20 0610  NA 141  K 4.0  CL 103  CO2 28  GLUCOSE 92  BUN 11  CREATININE 1.27*  CALCIUM 9.7    Intake/Output Summary (Last 24 hours) at 01/11/2020 0838 Last data filed at 01/10/2020 1831 Gross per 24 hour  Intake 844 ml  Output --  Net 844 ml     Physical Exam: Vital Signs Blood pressure (!) 130/100, pulse 68, temperature (!) 97.5 F (36.4 C), temperature source Oral, resp. rate 16, height 5\' 7"  (1.702 m), weight 71 kg, SpO2 100 %.  General: No acute distress Mood and affect are appropriate Heart: Regular rate and rhythm no rubs murmurs or extra sounds Lungs: Clear to auscultation, breathing unlabored, no rales or wheezes Abdomen: Positive bowel sounds, soft nontender to palpation, nondistended Extremities: No clubbing, cyanosis, or edema Skin: No evidence of breakdown, no evidence of rash  Neuro: Alert Motor: LUE 2- biceps otherwise 0/5 LUE, 3- Left HF KE Increased tone noted in left upper extremity/left lower extremity Left wrist and ankle clonus ongoing.  Assessment/Plan: 1. 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  Physiatrist is providing close team supervision and 24 hour management of active medical problems listed below.  Physiatrist and rehab team continue to assess barriers to discharge/monitor patient progress toward functional and medical goals  Care Tool:  Bathing    Body parts bathed by patient: Left arm, Chest, Abdomen, Front perineal area, Buttocks, Right upper leg, Left upper leg, Right lower leg, Left  lower leg, Face, Right arm   Body parts bathed by helper: Right arm     Bathing assist Assist Level: Minimal Assistance - Patient > 75%     Upper Body Dressing/Undressing Upper body dressing Upper body dressing/undressing activity did not occur (including orthotics): N/A What is the patient wearing?: Pull over shirt    Upper body assist Assist Level: Minimal Assistance - Patient > 75%    Lower Body Dressing/Undressing Lower body dressing      What is the patient wearing?: Pants, Incontinence brief     Lower body assist Assist for lower body dressing: Moderate Assistance - Patient 50 - 74%     Toileting Toileting    Toileting assist Assist for toileting: Minimal Assistance - Patient > 75% Assistive Device Comment: urinal (assist placing urinal, pt will hold)   Transfers Chair/bed transfer  Transfers assist  Chair/bed transfer activity did not occur: N/A  Chair/bed transfer assist level: Minimal Assistance - Patient > 75%     Locomotion Ambulation   Ambulation assist   Ambulation activity did not occur: Safety/medical concerns  Assist level: 2 helpers Assistive device: Walker-rolling (with L hand orthosis) Max distance: 45 ft   Walk 10 feet activity   Assist  Walk 10 feet activity did not occur: Safety/medical concerns  Assist level: Moderate Assistance - Patient - 50 - 74% Assistive device: Walker-rolling, Orthosis (L hand orthosis)   Walk 50 feet activity   Assist Walk 50 feet with 2 turns activity did not occur: Safety/medical concerns (not yet performing  turns)  Assist level: 2 helpers Assistive device: Lite Gait    Walk 150 feet activity   Assist Walk 150 feet activity did not occur: Safety/medical concerns         Walk 10 feet on uneven surface  activity   Assist Walk 10 feet on uneven surfaces activity did not occur: Safety/medical concerns         Wheelchair     Assist Will patient use wheelchair at discharge?: No (No  PT lomg term goals set)             Wheelchair 50 feet with 2 turns activity    Assist            Wheelchair 150 feet activity     Assist          Blood pressure (!) 130/100, pulse 68, temperature (!) 97.5 F (36.4 C), temperature source Oral, resp. rate 16, height 5\' 7"  (1.702 m), weight 71 kg, SpO2 100 %.  Medical Problem List and Plan: 1.  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  Antithrombotics: -DVT/anticoagulation: Subcutaneous heparin initiated 12/11/2019,dopplers neg              -antiplatelet therapy: N/A 3. Pain Management: Tylenol as needed,    Topirimate 25mg  BID, DC'd due to confusion  Controlled on 7/26 4. Mood: Provide emotional support             -antipsychotic agents: N/A 5. Neuropsych: This patient is not fully capable of making complex decisions on his own behalf. 6. Skin/Wound Care: Routine skin checks 7. Fluids/Electrolytes/Nutrition: Routine in and outs. 8.  Post stroke dysphagia.    Regular textured thins with accommodations.  Follow-up speech therapy.  Advance diet as tolerated 9.  Left lung pneumonia.  Intravenous Unasyn completed 10.  Hypertension.  Norvasc 10 mg daily, Coreg 25 mg twice daily, clonidine 0.3 mg twice daily  Hydralazine 100 mg every 8 hours, d/ced on 7/10  Lisinopril 10 mg twice daily, decreased to 5 twice daily on 7/11, decreased to daily on 7/14.   Vitals:   01/11/20 0401 01/11/20 0744  BP: (!) 117/94 (!) 130/100  Pulse: 68   Resp: 16   Temp: (!) 97.5 F (36.4 C)   SpO2: 100%    Clonidine dose down to 1mg  BID -             Relatively controlled on 9/38, diastolic blood pressure elevated at times. 11.  Urine drug screen positive marijuana.  Counseled on appropriate 12.  Pre renal azotemia   Creatinine 1.32 on 7/19, labs ordered  for tomorrow 13.  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  This will likely occur more often at night.  Monitor for signs of alcohol withdrawal although drinking pattern was mainly on the weekends. 14.  Sleep disturbance   Trial Seroquel increased to 50 mg nightly with improvement  Improving 15.  Leukocytosis: Resolved   WBCs 10.4 on 7/12 16.  Transaminitis: Resolved 17.  Hyponatremia- resolved  18.  Normochomic anemia  Hemoglobin 12.3 on 7/14, labs ordered for tomorrow  LOS: 27 days A FACE TO [place] E Jetty Berland 01/11/2020, 8:38 AM

## 2020-01-11 NOTE — Discharge Instructions (Signed)
Inpatient Rehab Discharge Instructions  Kelso Bibby Discharge date and time: No discharge date for patient encounter.   Activities/Precautions/ Functional Status: Activity: activity as tolerated Diet:  Wound Care: none needed Functional status:  ___ No restrictions     ___ Walk up steps independently ___ 24/7 supervision/assistance   ___ Walk up steps with assistance ___ Intermittent supervision/assistance  ___ Bathe/dress independently ___ Walk with walker     _x__ Bathe/dress with assistance ___ Walk Independently    ___ Shower independently ___ Walk with assistance    ___ Shower with assistance ___ No alcohol     ___ Return to work/school ________ COMMUNITY REFERRALS UPON DISCHARGE:     Outpatient: PT     OT   ST             Agency: [facility]  Phone: [phone]             Appointment Date/Time:*Please expect follow-up to schedule your appointment within 7-10 business days. If you have not received follow-up, be sure to contact the site directly.*   Medical Equipment/Items Ordered: Air cabin crew, Tub Producer, television/film/video, Drop Arm Bedside Commode                                                 Agency/Supplier: [facility] [employer] [phone]  Special Instructions: No driving smoking alcohol or illicit drug use   My questions have been answered and I understand these instructions. I will adhere to these goals and the provided educational materials after my discharge from the hospital.  Patient/Caregiver Signature _______________________________ Date __________  Clinician Signature _______________________________________ Date __________  Please bring this form and your medication list with you to all your follow-up doctor's appointments.

## 2020-01-11 NOTE — Progress Notes (Signed)
Occupational Therapy Discharge Summary  Patient Details  Name: Dennis Mercado MRN: [id number] Date of Birth: [birth year]  Today's Date: 01/11/2020 OT Individual Time: 1415-1535 OT Individual Time Calculation (min): 80 min   Patient seated in w/c, pleasant/cooperative and able to recall activities from session yesterday.  He requests a shower this afternoon and practice with dressing techniques.  Sit pivot transfer w/c to/from shower bench with min A, cues for placement of left LE at times and awareness of positioning of left UE during activity.  Completed shower seated on bench with hand held shower - CGA in stance to wash buttocks, min A for left lower leg.  Completed dressing tasks seated in w/c with CS for OH shirt, min A for pants over left LE.  He is able to pull pants over hips in stance with CGA to maintain standing balance.   Completed visual motor exercises and reviewed strategies to improve coordination in home environment - able to complete brock string activity in central position with mild strain noted.  VOR at slow rate (more difficulty with horizontal than vertical)  Accomodation activity with more difficulty transitioning to distance.  Convergence continues to be impaired but improves with activities above.   Completed left UE NMRE both seated and supine positions with good response to inhibition techniques - note increased flexor tone after adl this session.  Able to facilitate pronation and digit extension today for the first time.  He returned to bed at close of session, bed alarm set and call bell in reach - he states that he is excited and feels ready for transition to home tomorrow.     Patient has met 15 of 15 long term goals due to improved activity tolerance, improved balance, postural control, functional use of  LEFT upper and LEFT lower extremity, improved attention, improved awareness and improved coordination.  Patient to discharge at [facility] Assist level.  Patient's care  partner is independent to provide the necessary physical assistance at discharge.    Reasons goals not met: na  Recommendation:  Patient will benefit from ongoing skilled OT services in outpatient setting to continue to advance functional skills in the area of BADL, iADL, Vocation and Reduce care partner burden.  Equipment: drop arm commode, tub transfer bench  Reasons for discharge: treatment goals met  Patient/family agrees with progress made and goals achieved: Yes  OT Discharge Precautions/Restrictions    General   Vital Signs Therapy Vitals Temp: 97.9 F (36.6 C) Temp Source: Oral Pulse Rate: 85 Resp: 18 BP: 106/79 Patient Position (if appropriate): Lying Oxygen Therapy SpO2: 100 % O2 Device: Room Air Pain Pain Assessment Pain Scale: 0-10 Pain Score: 0-No pain ADL ADL Eating: Modified independent Where Assessed-Eating: Wheelchair Grooming: Modified independent Where Assessed-Grooming: Sitting at sink, Wheelchair Upper Body Bathing: Supervision/safety Where Assessed-Upper Body Bathing: Shower Lower Body Bathing: Minimal assistance Where Assessed-Lower Body Bathing: Shower Upper Body Dressing: Supervision/safety Where Assessed-Upper Body Dressing: Wheelchair Lower Body Dressing: Minimal assistance Where Assessed-Lower Body Dressing: Wheelchair Toileting: Minimal assistance Where Assessed-Toileting: Glass blower/designer: Psychiatric nurse Method: Glass blower/designer: Drop arm Geophysical data processor: Environmental education officer Method: Education officer, environmental: Gaffer Baseline Vision/History: No visual deficits Patient Visual Report: Other (comment) (patient notes occ difficulty with focusing that resolves with blinking) Vision Assessment?: Yes Eye Alignment: Within Functional Limits (occ lateral positioning of left eye that resolves with  blink/re-focus) Ocular Range of Motion: Within Functional Limits Alignment/Gaze  Preference: Within Defined Limits Tracking/Visual Pursuits: Decreased smoothness of eye movement to LEFT inferior field Saccades: Decreased speed of saccadic movement Convergence: Impaired (comment) (left eye swings at approx 12 inches - improves with practice/convergence activities) Visual Fields: No apparent deficits Diplopia Assessment: Other (comment) Perception  Perception: Within Functional Limits Inattention/Neglect: Other (comment) (improving attention to left side) Praxis Praxis: Intact Cognition Overall Cognitive Status: Within Functional Limits for tasks assessed Arousal/Alertness: Awake/alert Orientation Level: Oriented X4 Focused Attention: Appears intact Sustained Attention: Appears intact Self Monitoring: Appears intact Safety/Judgment: Appears intact  Coordination Finger Nose Finger Test: unable left, right WNL Motor  Motor Motor - Discharge Observations: L sided hemiplegia, UE>LE, improved from Eval Mobility  Bed Mobility Supine to Sit: Supervision/Verbal cueing Sit to Supine: Supervision/Verbal cueing Transfers Sit to Stand: Contact Guard/Touching assist Stand to Sit: Contact Guard/Touching assist  Trunk/Postural Assessment  Postural Control Trunk Control: able to achieve symmetrical seated posture with cues, tends to rotate trunk and drop left shoulder in seated position.  Balance Static Sitting Balance Static Sitting - Level of Assistance: 6: Modified independent (Device/Increase time) Dynamic Sitting Balance Dynamic Sitting - Level of Assistance: 5: Stand by assistance Static Standing Balance Static Standing - Level of Assistance: 4: Min assist Dynamic Standing Balance Dynamic Standing - Level of Assistance: 4: Min assist Extremity/Trunk Assessment RUE Assessment RUE Assessment: Within Functional Limits General Strength Comments: 5/5 LUE Assessment Passive Range of  Motion (PROM) Comments: WNL after inhibition of flexors Active Range of Motion (AROM) Comments: scapula 1/2-3/4, shoulder flex/abd trace, IR 1/2, ER trace, elbow flex 1/2, elbow ext able to facilitate 1/4 at times, holds in supination, able to facilitate pronation, no wrist, tight digit flexors, able to faciliate 1/4 MP extension   Erline Levine A Lorraine Cimmino 01/11/2020, 4:10 PM

## 2020-01-11 NOTE — Progress Notes (Signed)
Physical Therapy Session Note  Patient Details  Name: Dennis Mercado MRN: [id number] Date of Birth: 14-Oct-1980  Today's Date: 01/11/2020 PT Individual Time: 1225-1248 PT Individual Time Calculation (min): 23 min   Short Term Goals: Week 1:  PT Short Term Goal 1 (Week 1): Pt will perform supine<>sit with mod assist PT Short Term Goal 1 - Progress (Week 1): Progressing toward goal PT Short Term Goal 2 (Week 1): Pt will perform bed<>chair transfer with max assist (not using lift equipment) PT Short Term Goal 2 - Progress (Week 1): Met PT Short Term Goal 3 (Week 1): Pt will initiate gait training PT Short Term Goal 3 - Progress (Week 1): Met PT Short Term Goal 4 (Week 1): Pt will participate in sitting balance assessment PT Short Term Goal 4 - Progress (Week 1): Progressing toward goal  Skilled Therapeutic Interventions/Progress Updates:   Pt received supine in bed and agreeable to PT. Supine>sit transfer with min assist for time management. Orthotist present for treatment to perform orthotic assessment. Gait training in room x 22f with RW, R anterior support rigid LAFO and min assist from PT. Pt noted to have 2 instances of GR with gait training, but able to improve knee control with decreased step length on the RLE. Orthotist recommending use of current AFO with heel wedge to improve knee control in stance on the L.  Pt returned to room and performed stand pivot transfer to bed with RW and min assist. Sit>supine completed with  min assist for LLE control and time management and left supine in bed with call bell in reach and all needs met.        Therapy Documentation Precautions:  Precautions Precautions: Fall Precaution Comments: L UE/LE severe paresis with impaired sensation; impaired safety awareness Restrictions Weight Bearing Restrictions: No Vital Signs: Therapy Vitals Temp: 97.9 F (36.6 C) Temp Source: Oral Pulse Rate: 85 Resp: 18 BP: 106/79 Patient Position (if  appropriate): Lying Oxygen Therapy SpO2: 100 % O2 Device: Room Air Pain: Pain Assessment Pain Scale: 0-10 Pain Score: 0-No pain Pain Type: Acute pain Pain Location: Shoulder Pain Orientation: Left Pain Descriptors / Indicators: Aching Pain Onset: Gradual Pain Intervention(s): Medication (See eMAR) Mobility: Bed Mobility Supine to Sit: Supervision/Verbal cueing Sit to Supine: Supervision/Verbal cueing Transfers Transfers: Squat Pivot Transfers Sit to Stand: Contact Guard/Touching assist Stand to Sit: Contact Guard/Touching assist Squat Pivot Transfers: Minimal Assistance - Patient > 75%   Therapy/Group: Individual Therapy  ALorie Phenix7/27/2021, 4:53 PM

## 2020-01-12 NOTE — Progress Notes (Signed)
[place] PHYSICAL MEDICINE & REHABILITATION PROGRESS NOTE   Subjective/Complaints:  Discussed estimated need for 24/7 sup (~81mo) Pt without c/os today Discussed need for pt/Wife to make PCP F/u   ROS: Denies CP, SOB, N/V/D  Objective:   No results found. Recent Labs    01/10/20 0610  WBC 6.8  HGB 12.1*  HCT 39.0  PLT 215   Recent Labs    01/10/20 0610  NA 141  K 4.0  CL 103  CO2 28  GLUCOSE 92  BUN 11  CREATININE 1.27*  CALCIUM 9.7    Intake/Output Summary (Last 24 hours) at 01/12/2020 0959 Last data filed at 01/12/2020 0900 Gross per 24 hour  Intake 602 ml  Output 300 ml  Net 302 ml     Physical Exam: Vital Signs Blood pressure 107/83, pulse 70, temperature 98.3 F (36.8 C), temperature source Oral, resp. rate 15, height 5\' 7"  (1.702 m), weight 71 kg, SpO2 100 %.  General: No acute distress Mood and affect are appropriate Heart: Regular rate and rhythm no rubs murmurs or extra sounds Lungs: Clear to auscultation, breathing unlabored, no rales or wheezes Abdomen: Positive bowel sounds, soft nontender to palpation, nondistended Extremities: No clubbing, cyanosis, or edema Skin: No evidence of breakdown, no evidence of rash   Neuro: Alert Motor: LUE 2- biceps otherwise 0/5 LUE, 3- Left HF KE Increased tone noted in left upper extremity/left lower extremity Left wrist and ankle clonus ongoing.  Assessment/Plan: 1. 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  Physiatrist is providing close team supervision and 24 hour management of active medical problems listed below.  Physiatrist and rehab team continue to assess barriers to discharge/monitor patient progress toward functional and medical goals  Care Tool:  Bathing    Body parts bathed by patient: Left arm, Chest, Abdomen, Front perineal area, Buttocks, Right upper leg, Left upper  leg, Right lower leg, Left lower leg, Face, Right arm   Body parts bathed by helper: Right arm     Bathing assist Assist Level: Minimal Assistance - Patient > 75%     Upper Body Dressing/Undressing Upper body dressing Upper body dressing/undressing activity did not occur (including orthotics): N/A What is the patient wearing?: Pull over shirt    Upper body assist Assist Level: Supervision/Verbal cueing    Lower Body Dressing/Undressing Lower body dressing      What is the patient wearing?: Pants, Incontinence brief     Lower body assist Assist for lower body dressing: Minimal Assistance - Patient > 75%     Toileting Toileting    Toileting assist Assist for toileting: Moderate Assistance - Patient 50 - 74% Assistive Device Comment: urinal (assist placing urinal, pt will hold)   Transfers Chair/bed transfer  Transfers assist  Chair/bed transfer activity did not occur: N/A  Chair/bed transfer assist level: Minimal Assistance - Patient > 75%     Locomotion Ambulation   Ambulation assist   Ambulation activity did not occur: Safety/medical concerns  Assist level: 2 helpers Assistive device: Walker-rolling (with L hand orthosis) Max distance: 45 ft   Walk 10 feet activity   Assist  Walk 10 feet activity did not occur: Safety/medical concerns  Assist level: Moderate Assistance - Patient - 50 - 74% Assistive device: Walker-rolling, Orthosis (L hand orthosis)   Walk 50 feet activity   Assist Walk 50 feet with 2 turns activity did not occur: Safety/medical concerns (  not yet performing turns)  Assist level: 2 helpers Assistive device: Lite Gait    Walk 150 feet activity   Assist Walk 150 feet activity did not occur: Safety/medical concerns         Walk 10 feet on uneven surface  activity   Assist Walk 10 feet on uneven surfaces activity did not occur: Safety/medical concerns         Wheelchair     Assist Will patient use wheelchair at  discharge?: No (No PT lomg term goals set)             Wheelchair 50 feet with 2 turns activity    Assist            Wheelchair 150 feet activity     Assist          Blood pressure 107/83, pulse 70, temperature 98.3 F (36.8 C), temperature source Oral, resp. rate 15, height 5\' 7"  (1.702 m), weight 71 kg, SpO2 100 %.  Medical Problem List and Plan: 1.  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  Antithrombotics: -DVT/anticoagulation: Subcutaneous heparin initiated 12/11/2019,dopplers neg              -antiplatelet therapy: N/A 3. Pain Management: Tylenol as needed,    Topirimate 25mg  BID, DC'd due to confusion  Controlled on 7/26 4. Mood: Provide emotional support             -antipsychotic agents: N/A 5. Neuropsych: This patient is not fully capable of making complex decisions on his own behalf. 6. Skin/Wound Care: Routine skin checks 7. Fluids/Electrolytes/Nutrition: Routine in and outs. 8.  Post stroke dysphagia.    Regular textured thins with accommodations.  Follow-up speech therapy.  Advance diet as tolerated 9.  Left lung pneumonia.  Intravenous Unasyn completed 10.  Hypertension.  Norvasc 10 mg daily, Coreg 25 mg twice daily, clonidine 0.3 mg twice daily  Hydralazine 100 mg every 8 hours, d/ced on 7/10  Lisinopril 10 mg twice daily, decreased to 5 twice daily on 7/11, decreased to daily on 7/14.   Vitals:   01/11/20 1915 01/12/20 0355  BP: 107/80 107/83  Pulse: 79 70  Resp: 17 15  Temp: 98.9 F (37.2 C) 98.3 F (36.8 C)  SpO2: 100% 100%   Clonidine dose down to 1mg  BID -   controlled on 7/28 11.  Urine drug screen positive marijuana.  Counseled on appropriate 12.  Pre renal azotemia   improved 13.  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  This will  likely occur more often at night.  Monitor for signs of alcohol withdrawal although drinking pattern was mainly on the weekends. 14.  Sleep disturbance   Trial Seroquel increased to 50 mg nightly with improvement  Improving 15.  Leukocytosis: Resolved   WBCs 10.4 on 7/12 16.  Transaminitis: Resolved 17.  Hyponatremia- resolved  18.  Normochomic anemia  Hemoglobin 12.3 on 7/14, labs ordered for tomorrow  LOS: 28 days A FACE TO [place] [facility] 01/12/2020, 9:59 AM

## 2020-01-12 NOTE — Plan of Care (Signed)
Problem: Consults Goal: RH STROKE PATIENT EDUCATION Description: See Patient Education module for education specifics  Outcome: Completed/Met   Problem: RH BLADDER ELIMINATION Goal: RH STG MANAGE BLADDER WITH ASSISTANCE Description: STG Manage Bladder With min Assistance Outcome: Completed/Met   Problem: RH SKIN INTEGRITY Goal: RH STG SKIN FREE OF INFECTION/BREAKDOWN Description: Pt will be free of skin breakdown/infection with min assist during CIR stay Outcome: Completed/Met   Problem: RH SAFETY Goal: RH STG ADHERE TO SAFETY PRECAUTIONS W/ASSISTANCE/DEVICE Description: STG Adhere to Safety Precautions With cues/reminders Assistance/Device. Outcome: Completed/Met   Problem: RH KNOWLEDGE DEFICIT Goal: RH STG INCREASE KNOWLEDGE OF HYPERTENSION Description: Pt/family will be able to demonstrate knowledge of HTN management with medications and diet precautions and ways to monitor blood pressure with min assist using handouts/booklets provided by staff Outcome: Completed/Met Goal: RH STG INCREASE KNOWLEGDE OF HYPERLIPIDEMIA Description: Pt/family will be able to demonstrate knowledge of HLD management with medications and diet precautions with min assist using handouts/booklets provided by staff Outcome: Completed/Met Goal: RH STG INCREASE KNOWLEDGE OF STROKE PROPHYLAXIS Description: Pt/family will be able to demonstrate knowledge of stroke prevention with medications and diet precautions and ways to monitor signs/symptoms of stroke with min assist using handouts/booklets provided by staff Outcome: Completed/Met   Problem: Consults Goal: RH STROKE PATIENT EDUCATION Description: See Patient Education module for education specifics  Outcome: Completed/Met Goal: Nutrition Consult-if indicated Outcome: Completed/Met Goal: Diabetes Guidelines if Diabetic/Glucose > 140 Description: If diabetic or lab glucose is > 140 mg/dl - Initiate Diabetes/Hyperglycemia Guidelines & Document  Interventions  Outcome: Completed/Met   Problem: RH BOWEL ELIMINATION Goal: RH STG MANAGE BOWEL WITH ASSISTANCE Description: STG Manage Bowel with Assistance. Outcome: Completed/Met Goal: RH STG MANAGE BOWEL W/MEDICATION W/ASSISTANCE Description: STG Manage Bowel with Medication with Assistance. Outcome: Completed/Met   Problem: RH COGNITION-NURSING Goal: RH STG USES MEMORY AIDS/STRATEGIES W/ASSIST TO PROBLEM SOLVE Description: STG Uses Memory Aids/Strategies With Assistance to Problem Solve. Outcome: Completed/Met Goal: RH STG ANTICIPATES NEEDS/CALLS FOR ASSIST W/ASSIST/CUES Description: STG Anticipates Needs/Calls for Assist With Assistance/Cues. Outcome: Completed/Met   Problem: RH PAIN MANAGEMENT Goal: RH STG PAIN MANAGED AT OR BELOW PT'S PAIN GOAL Outcome: Completed/Met   Problem: RH KNOWLEDGE DEFICIT Goal: RH STG INCREASE KNOWLEDGE OF DYSPHAGIA/FLUID INTAKE Outcome: Completed/Met

## 2020-01-12 NOTE — Progress Notes (Signed)
Patient discharged off of unit with all belongings. Discharge papers/instructions explained by physician assistant to family. Patient and family have no further questions at time of discharge. No complications noted at this time.  Dennis Mercado [name]  

## 2020-01-13 DIAGNOSIS — I619 Nontraumatic intracerebral hemorrhage, unspecified: Secondary | ICD-10-CM | POA: Diagnosis not present

## 2020-01-13 DIAGNOSIS — G8194 Hemiplegia, unspecified affecting left nondominant side: Secondary | ICD-10-CM | POA: Diagnosis not present

## 2020-01-14 ENCOUNTER — Telehealth: Payer: Self-pay

## 2020-01-14 NOTE — Progress Notes (Signed)
Inpatient Rehabilitation Care Coordinator  Discharge Note  The overall goal for the admission was met for:   Discharge location: Yes. D/c to home with his wife.   Length of Stay: Yes. 27 days.   Discharge activity level: Yes. Supervision.  Home/community participation: Yes. Limited.   Services provided included: MD, RD, PT, OT, RN, CM, TR, Pharmacy, Neuropsych and SW  Financial Services: Private Insurance: [place]  Follow-up services arranged: Outpatient: [facility] for PT/OT/SLP and DME: [place] for specialty w/c, TTB, DABSC  Comments (or additional information): contact pt wife [phone]  Patient/Family verbalized understanding of follow-up arrangements: Yes  Individual responsible for coordination of the follow-up plan: Pt will have assistance with coordinating care needs.   Confirmed correct DME delivered: Rana Snare 01/14/2020    Rana Snare

## 2020-01-14 NOTE — Telephone Encounter (Signed)
Transitional Care phone call placed at 4:15 on 01/14/2020 (called twice). Patient was not available. Message left for a call back. Or PMR will attempt to call him on Monday for follow up.   Thank you

## 2020-01-17 ENCOUNTER — Ambulatory Visit: Payer: BC Managed Care – PPO | Attending: Physician Assistant | Admitting: Physical Therapy

## 2020-01-17 ENCOUNTER — Telehealth: Payer: Self-pay

## 2020-01-17 ENCOUNTER — Other Ambulatory Visit: Payer: Self-pay

## 2020-01-17 ENCOUNTER — Telehealth: Payer: Self-pay | Admitting: Physical Medicine & Rehabilitation

## 2020-01-17 DIAGNOSIS — M6281 Muscle weakness (generalized): Secondary | ICD-10-CM | POA: Diagnosis not present

## 2020-01-17 DIAGNOSIS — R414 Neurologic neglect syndrome: Secondary | ICD-10-CM | POA: Insufficient documentation

## 2020-01-17 DIAGNOSIS — R2689 Other abnormalities of gait and mobility: Secondary | ICD-10-CM | POA: Insufficient documentation

## 2020-01-17 DIAGNOSIS — R41841 Cognitive communication deficit: Secondary | ICD-10-CM | POA: Diagnosis not present

## 2020-01-17 DIAGNOSIS — R209 Unspecified disturbances of skin sensation: Secondary | ICD-10-CM | POA: Diagnosis not present

## 2020-01-17 DIAGNOSIS — I69354 Hemiplegia and hemiparesis following cerebral infarction affecting left non-dominant side: Secondary | ICD-10-CM | POA: Diagnosis not present

## 2020-01-17 DIAGNOSIS — R2681 Unsteadiness on feet: Secondary | ICD-10-CM | POA: Insufficient documentation

## 2020-01-17 DIAGNOSIS — R29818 Other symptoms and signs involving the nervous system: Secondary | ICD-10-CM | POA: Insufficient documentation

## 2020-01-17 DIAGNOSIS — R41842 Visuospatial deficit: Secondary | ICD-10-CM | POA: Insufficient documentation

## 2020-01-17 NOTE — Telephone Encounter (Signed)
Test--trying to see if we can send to [place] an encounter

## 2020-01-17 NOTE — Telephone Encounter (Signed)
Transitional Care Call-- Spoke with [name]   1. Are you/is patient experiencing any problems since coming home? No problems.   Are there any questions regarding any aspect of care? Patient seeking out patient therapies. No one has contacted him.  2. Are there any questions regarding medications administration/dosing? No problem or questions.  Are meds being taken as prescribed? Yes.  Patient should review meds with caller to confirm. Medication confirmed with patient.  3. Have there been any falls? No falls 4. Has Home Health been to the house and/or have they contacted you? No.  If not, have you tried to contact them? No  Can we help you contact them? Contact information given. (Patient does have appointment with scheduled).  5. Are bowels and bladder emptying properly?  Yes. Are there any unexpected incontinence issues? None.  If applicable, is patient following bowel/bladder programs? None.  6. Any fevers, problems with breathing, unexpected pain? None. 7. Are there any skin problems or new areas of breakdown? None. 8. Has the patient/family member arranged specialty MD follow up (ie cardiology/neurology/renal/surgical/etc)?  Yes.  Can we help arrange? No help needed.  9. Does the patient need any other services or support that we can help arrange? 10. Are caregivers following through as expected in assisting the patient? Yes.         11. Has the patient quit smoking, drinking alcohol, or using drugs as recommended? Not since being home.   Appointment Date/Time/ Arrival time/ and who they are seeing [place]

## 2020-01-17 NOTE — Telephone Encounter (Signed)
Returned call to [employer]. Please call him

## 2020-01-17 NOTE — Telephone Encounter (Signed)
Task completed

## 2020-01-17 NOTE — Therapy (Signed)
[facility] [address] [place], [place], [address] Phone: [phone]   Fax:  [phone]  Physical Therapy Evaluation  Patient Details  Name: Dennis Mercado MRN: [id number] Date of Birth: 1980-10-28 Referring Provider (PT): Angiulli, Chapman Fitch (will be followed by Dr. Letta Pate)   Encounter Date: 01/17/2020   PT End of Session - 01/18/20 0921    Visit Number 1    Number of Visits 15    Date for PT Re-Evaluation 03/18/20    Authorization Type BCBS, VL for PT and OT = 30 (zero used, if seen on same day, will count as 2 visits), VL for ST = 30    Authorization - Visit Number 0    Authorization - Number of Visits 30    PT Start Time 5093    PT Stop Time 1404    PT Time Calculation (min) 46 min    Equipment Utilized During Treatment Gait belt    Activity Tolerance Patient tolerated treatment well    Behavior During Therapy [facility] for tasks assessed/performed           No past medical history on file.  No past surgical history on file.  There were no vitals filed for this visit.    Subjective Assessment - 01/17/20 1322    Subjective Presented to ED on 12/06/2019 with left side weakness and  altered mental status during sexual activity.  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 Pt required intubation 6/22 due to ARF secondary to aspiration PNA. Received inpatient rehab.  Discharged 01/12/20. [name] taking a couple months off of work. [name] reports he is bathing himself and is getting more control in his L quad with a lot less buckling. Is walking to the toilet approx. 4-5 steps with support from wife. Was not discharged home with a RW. No falls.    Pertinent History HTN    Patient Stated Goals wants to start walking as soon as possible - "less walker and downgrade to a cane and then nothing at all"    Currently in Pain?  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 Letta Pate   Onset Date/Surgical Date 12/06/19    Hand Dominance Right    Next MD Visit Dr. Letta Pate on 01/25/20    Prior Therapy [facility] PT, OT and ST      Precautions   Precautions Fall    Required Braces or Orthoses --   has arm sling, has not been wearing     Balance Screen   Has the patient fallen in the past 6 months No    Has the patient had a decrease in activity level because of a fear of falling?  No    Is the patient reluctant to leave their home because of a fear of falling?  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) - take her on walks again, dancing (roomba and ballroom), go to the beach and walking on the sand      Observation/Other Assessments   Observations LLE externally rotated and ABDucted seated in w/c, inattention to L side      Sensation   Light Touch Impaired by gross assessment;Impaired Detail    Light Touch Impaired Details Absent LLE    Hot/Cold Appears Intact   per pt report   Additional Comments pt able to detect deeper pressure on LLE intermittently, but unable to localize      Coordination   Gross Motor Movements  are Fluid and Coordinated No    Heel Shin Test WNL RLE, unable to perform with LLE due to weakness      Posture/Postural Control   Posture/Postural Control Postural limitations    Postural Limitations Posterior pelvic tilt      Tone   Assessment Location Left Lower Extremity      ROM / Strength   AROM / PROM / Strength Strength      Strength   Strength Assessment Site Hip;Knee;Ankle    Right/Left Hip Right;Left    Right Hip Flexion 5/5    Left Hip Flexion 2+/5    Right/Left Knee Right;Left    Right Knee Flexion 5/5    Right Knee Extension 5/5    Left Knee Flexion 2+/5    Left Knee Extension 2+/5    Right/Left Ankle Right;Left    Left Ankle Dorsiflexion --   unable to perform today due to AFO   Left Ankle Plantar Flexion --   unable to perform today due to AFO     Bed Mobility   Rolling Right Supervision/verbal cueing    Supine to Sit Contact Guard/Touching assist    Sit to Supine Contact Guard/Touching assist      Transfers   Transfers Squat Pivot Transfers;Sit to Stand;Stand to Sit    Sit to Stand 4: Min guard;From bed   from w/c   Stand to Sit 4: Min guard    Squat Pivot Transfers 4: Min Financial risk analyst Details (indicate cue type and reason) from w/c <> mat table, 1st rep towards pt's R, 2nd rep towards pt's L, with therapist providing min A at L knee    Comments cues for attention to LLE prior to standing to make sure LLE is in proper position, when performing stand > sit - cues to attend to LLE for therapist to remove it from L hand orthosis prior to sitting      Ambulation/Gait   Ambulation/Gait Yes    Ambulation/Gait Assistance 4: Min assist    Ambulation/Gait Assistance Details w/c follow from pt's wife, min A for balance and intermittent min A for LLE foot placement as pt with tendency to perform with more narrow BOS with LLE. one instance when L side of RW lifted off the ground due to pt with difficulty putting weight through LUE    Ambulation  Distance (Feet) 23 Feet    Assistive device Rolling walker;Other (Comment)  with L hand orthosis   Gait Pattern Step-to pattern;Decreased step length - right;Decreased stance time - left;Decreased hip/knee flexion - left;Decreased weight shift to left;Left flexed knee in stance;Narrow base of support;Poor foot clearance - left    Ambulation Surface Level;Indoor      LLE Tone   LLE Tone Hypertonic                      Objective measurements completed on examination: See above findings.               PT Education - 01/17/20 1411    Education Details clinical findings, POC, continue with exercises given from inpatient rehab, visit limit between PT and OT    Person(s) Educated Patient;Spouse    Methods Explanation    Comprehension Verbalized understanding            PT Short Term Goals - 01/18/20 6945      PT SHORT TERM GOAL #1   Title Pt and pt's spouse will be independent with initial HEP in order to build upon functional gains made in therapy. ALL STGS DUE 02/15/20    Time 4    Period Weeks    Status New    Target Date 02/15/20      PT SHORT TERM GOAL #2   Title Pt will perform squat pivot vs. stand pivot with supervision in order to improve functional mobility and decr caregiver burden.    Time 4    Period Weeks    Status New      PT SHORT TERM GOAL #3   Title Pt will perform sit <> stands with RW with supervision and pt able to place LUE in L hand orthosis with supervision in order to improve functional mobility.    Time 4    Period Weeks    Status New      PT SHORT TERM GOAL #4   Title Pt will ambulate at least 100' with RW and min guard in order to improve household mobility.    Time 4    Period Weeks    Status New      PT SHORT TERM GOAL #5   Title Pt will perform standing balance with single UE support at sink for 5 minutes with supervision in order to incr tolerance for ADLs.    Time 4    Period Weeks    Status New              PT Long Term Goals - 01/18/20 0941      PT LONG TERM GOAL #1   Title Pt and pt's spouse will be independent with final HEP in order to build upon functional gains made in therapy. ALL LTGS DUE 03/14/20    Time 8    Period Weeks    Status New    Target Date 03/14/20      PT LONG TERM GOAL #2   Title Pt will ambulate at least 230' with RW vs. LRAD and supervision in order to improve functional mobility.    Time 8    Period Weeks    Status New      PT LONG TERM GOAL #3   Title Pt will perform all bed mobility with mod I    Baseline 8    Period Weeks    Status New      PT LONG TERM GOAL #4   Title Pt will perform 2 steps with single HHA vs.  LRAD with min guard in order to safely enter home.    Time 8    Period Weeks    Status New      PT LONG TERM GOAL #5   Title Pt will perform all transfers with mod I in order to decr caregiver burden and improve functional mobility.    Time 8    Period Weeks    Status New                  Plan - 01/18/20 0924    Clinical Impression Statement Patient is a 39 year old male referred to Neuro OPPT s/p right thalamic hemorrhage. Presented to ED on 12/06/2019 with left side weakness and  altered mental status. Pt required intubation 6/22 due to ARF secondary to aspiration PNA. Received inpatient rehab and was discharged from hospital 01/12/20.  Pt's PMH is significant for: The following deficits were present during the exam:  impaired tone on LLE, decr LLE strength, impaired light touch sensation on LLE (able to detect deep pressure), gait abnormalities, impaired balance, decr awareness of L side, decr ROM on LLE, postural abnormalities, decr coordination. Pt requiring min A for squat pivot transfers and min A for gait with w/c follow for safety. Pt able to ambulate approx. 20' today with RW with L hand orthosis. Pt would benefit from skilled PT to address these impairments and functional limitations to maximize functional mobility independence     Personal Factors and Comorbidities Comorbidity 1;Past/Current Experience;Age;Profession    Comorbidities HTN    Examination-Activity Limitations Bathing;Bed Mobility;Carry;Hygiene/Grooming;Stand;Stairs;Squat;Transfers;Locomotion Level    Examination-Participation Restrictions Community Activity;Occupation   walking his dog   Stability/Clinical Decision Making Evolving/Moderate complexity    Clinical Decision Making Moderate    Rehab Potential Good    PT Frequency 2x / week    PT Duration --   7 weeks   PT Treatment/Interventions ADLs/Self Care Home Management;Aquatic Therapy;Electrical Stimulation;DME Instruction;Gait training;Stair training;Functional mobility training;Therapeutic activities;Therapeutic exercise;Balance training;Neuromuscular re-education;Wheelchair mobility training;Orthotic Fit/Training;Patient/family education;Passive range of motion;Energy conservation;Vestibular    PT Next Visit Plan review HEP given from inpatient rehab - add as needed, maybe any standing weight shifting at countertop? NMR for weightshifting through LLE, transfer training, gait training with RW.    Consulted and Agree with Plan of Care Patient;Family member/caregiver    Family Member Consulted wife, April           Patient will benefit from skilled therapeutic intervention in order to improve the following deficits and impairments:  Abnormal gait, Decreased activity tolerance, Decreased balance, Decreased mobility, Decreased knowledge of use of DME, Decreased coordination, Decreased range of motion, Decreased strength, Difficulty walking, Impaired tone, Impaired sensation, Postural dysfunction  Visit Diagnosis: Hemiplegia and hemiparesis following cerebral infarction affecting left non-dominant side (HCC)  Unsteadiness on feet  Other symptoms and signs involving the nervous system  Other abnormalities of gait and mobility  Muscle weakness (generalized)     Problem List Patient Active Problem  List   Diagnosis Date Noted  . Normocytic anemia   . Benign essential HTN   . Hyponatremia   . Acute blood loss anemia   . Hypotension due to drugs   . Vascular headache   . Prerenal azotemia   . Sleep disturbance   . Leukocytosis   . Transaminitis   . Marijuana abuse   . Essential hypertension   . Dysphagia, post-stroke   . Left hemiplegia ([place])   . Acute respiratory failure ([place])   . ICH (intracerebral hemorrhage) ([place])  12/06/2019    Arliss Journey, PT, DPT  01/18/2020, 9:44 AM  [facility] [address] [place], [place], [address] Phone: [phone]   Fax:  [phone]  Name: Dennis Mercado MRN: [id number] Date of Birth: [birth year]

## 2020-01-18 ENCOUNTER — Other Ambulatory Visit: Payer: Self-pay

## 2020-01-19 ENCOUNTER — Encounter: Payer: Self-pay | Admitting: Family Medicine

## 2020-01-19 ENCOUNTER — Ambulatory Visit: Payer: BC Managed Care – PPO | Admitting: Family Medicine

## 2020-01-19 VITALS — BP 116/70 | HR 90 | Temp 97.5°F | Ht 67.0 in | Wt 137.0 lb

## 2020-01-19 DIAGNOSIS — I10 Essential (primary) hypertension: Secondary | ICD-10-CM

## 2020-01-19 DIAGNOSIS — G8194 Hemiplegia, unspecified affecting left nondominant side: Secondary | ICD-10-CM | POA: Diagnosis not present

## 2020-01-19 DIAGNOSIS — Z8673 Personal history of transient ischemic attack (TIA), and cerebral infarction without residual deficits: Secondary | ICD-10-CM | POA: Diagnosis not present

## 2020-01-19 NOTE — Progress Notes (Signed)
Established Patient Office Visit  Subjective:  Patient ID: Dennis Mercado, male    DOB: [birth year]  Age: 39 y.o. MRN: [id number]  CC:  Chief Complaint  Patient presents with  . Hospitalization Follow-up    hospital follow up,     HPI Dennis Mercado presents for rehab discharge follow-up status post right sided subcortical CVA experienced on 6/21.  He is back home now with his wife.  Left hemiparesis persist.  Continues to work with physical therapy.  Blood pressure is well controlled on current therapy.  Continues with Lipitor.  Seizure-like activity had been witnessed in the ICU.  He was started on Keppra and continues on that medication.  EEG was negative at that time.  A follow-up CT scan obtained on 7/15 confirmed evolution of a hemorrhagic stroke involving the right thalamus.  Follow-up is planned with neurology on the 10th.  He has stopped drinking, smoking and using any illicit drugs.  His wife is present in the exam room with him today.  History reviewed. No pertinent past medical history.  History reviewed. No pertinent surgical history.  History reviewed. No pertinent family history.  Social History   Socioeconomic History  . Marital status: Single    Spouse name: Not on file  . Number of children: Not on file  . Years of education: Not on file  . Highest education level: Not on file  Occupational History  . Not on file  Tobacco Use  . Smoking status: Never Smoker  . Smokeless tobacco: Never Used  Substance and Sexual Activity  . Alcohol use: Yes    Comment: several drinks over the weekend  . Drug use: Never  . Sexual activity: Not on file  Other Topics Concern  . Not on file  Social History Narrative  . Not on file   Social Determinants of Health   Financial Resource Strain:   . Difficulty of Paying Living Expenses:   Food Insecurity:   . Worried About Charity fundraiser in the Last Year:   . Arboriculturist in the Last Year:   Transportation Needs:     . Film/video editor (Medical):   Marland Kitchen Lack of Transportation (Non-Medical):   Physical Activity:   . Days of Exercise per Week:   . Minutes of Exercise per Session:   Stress:   . Feeling of Stress :   Social Connections:   . Frequency of Communication with Friends and Family:   . Frequency of Social Gatherings with Friends and Family:   . Attends Religious Services:   . Active Member of Clubs or Organizations:   . Attends Archivist Meetings:   Marland Kitchen Marital Status:   Intimate Partner Violence:   . Fear of Current or Ex-Partner:   . Emotionally Abused:   Marland Kitchen Physically Abused:   . Sexually Abused:     Outpatient Medications Prior to Visit  Medication Sig Dispense Refill  . acetaminophen (TYLENOL) 325 MG tablet Take 2 tablets (650 mg total) by mouth every 4 (four) hours as needed for mild pain (or temp > 37.5 C (99.5 F)).    Marland Kitchen amLODipine (NORVASC) 10 MG tablet Take 1 tablet (10 mg total) by mouth daily. 30 tablet 0  . atorvastatin (LIPITOR) 40 MG tablet Take 1 tablet (40 mg total) by mouth daily. 30 tablet 0  . carvedilol (COREG) 25 MG tablet Take 1 tablet (25 mg total) by mouth 2 (two) times daily with a meal. 60 tablet 0  .  cloNIDine (CATAPRES) 0.1 MG tablet Take 1 tablet (0.1 mg total) by mouth 2 (two) times daily. 60 tablet 11  . levETIRAcetam (KEPPRA) 500 MG tablet Take 1 tablet (500 mg total) by mouth 2 (two) times daily. 60 tablet 0  . lisinopril (ZESTRIL) 5 MG tablet Take 1 tablet (5 mg total) by mouth daily. 30 tablet 0  . melatonin 3 MG TABS tablet Take 1 tablet (3 mg total) by mouth at bedtime. 30 tablet 0  . QUEtiapine (SEROQUEL) 50 MG tablet Take 1 tablet (50 mg total) by mouth at bedtime. 10 tablet 0  . lidocaine (LIDODERM) 5 % Place 1 patch onto the skin daily. Remove & Discard patch within 12 hours or as directed by MD (Patient not taking: Reported on 01/17/2020) 30 patch 0  . pantoprazole (PROTONIX) 40 MG tablet Take 1 tablet (40 mg total) by mouth daily.  (Patient not taking: Reported on 01/17/2020) 30 tablet 0   No facility-administered medications prior to visit.    Allergies  Allergen Reactions  . Percocet [Oxycodone-Acetaminophen] Itching    Pt says he has previously tolerated.     ROS Review of Systems  Constitutional: Negative.   HENT: Negative.   Respiratory: Negative.   Cardiovascular: Negative.   Gastrointestinal: Negative.   Genitourinary: Negative.   Musculoskeletal: Positive for gait problem.  Allergic/Immunologic: Negative for immunocompromised state.  Neurological: Positive for facial asymmetry and weakness. Negative for tremors, seizures, speech difficulty and headaches.      Objective:    Physical Exam Vitals and nursing note reviewed.  Constitutional:      General: He is not in acute distress.    Appearance: Normal appearance. He is normal weight. He is not ill-appearing or toxic-appearing.  HENT:     Head: Normocephalic and atraumatic.     Right Ear: Tympanic membrane, ear canal and external ear normal.     Left Ear: Tympanic membrane, ear canal and external ear normal.     Nose: Nose normal.     Mouth/Throat:     Mouth: Mucous membranes are dry.     Pharynx: Oropharynx is clear. No oropharyngeal exudate or posterior oropharyngeal erythema.  Eyes:     General: No scleral icterus.       Right eye: No discharge.        Left eye: No discharge.     Extraocular Movements: Extraocular movements intact.     Conjunctiva/sclera: Conjunctivae normal.     Pupils: Pupils are equal, round, and reactive to light.  Cardiovascular:     Rate and Rhythm: Normal rate and regular rhythm.  Pulmonary:     Effort: Pulmonary effort is normal.     Breath sounds: Normal breath sounds.  Abdominal:     General: Bowel sounds are normal.  Skin:    General: Skin is warm and dry.  Neurological:     Mental Status: He is oriented to person, place, and time.     Cranial Nerves: Cranial nerve deficit and facial asymmetry present.  No dysarthria.     Sensory: Sensation is intact.     Motor: Weakness present. No tremor or seizure activity.     Comments: Left facial droop    Psychiatric:        Mood and Affect: Mood normal.        Behavior: Behavior normal.     BP 116/70   Pulse 90   Temp (!) 97.5 F (36.4 C) (Tympanic)   Ht 5\' 7"  (1.702 m)  Wt 137 lb (62.1 kg)   SpO2 99%   BMI 21.46 kg/m  Wt Readings from Last 3 Encounters:  01/19/20 137 lb (62.1 kg)  12/22/19 156 lb 8.4 oz (71 kg)  12/12/19 163 lb 2.3 oz (74 kg)     Health Maintenance Due  Topic Date Due  . Hepatitis C Screening  Never done  . TETANUS/TDAP  Never done  . INFLUENZA VACCINE  01/16/2020    There are no preventive care reminders to display for this patient.  No results found for: TSH Lab Results  Component Value Date   WBC 6.8 01/10/2020   HGB 12.1 (L) 01/10/2020   HCT 39.0 01/10/2020   MCV 85.9 01/10/2020   PLT 215 01/10/2020   Lab Results  Component Value Date   NA 141 01/10/2020   K 4.0 01/10/2020   CO2 28 01/10/2020   GLUCOSE 92 01/10/2020   BUN 11 01/10/2020   CREATININE 1.27 (H) 01/10/2020   BILITOT 0.6 12/21/2019   ALKPHOS 42 12/21/2019   AST 19 12/21/2019   ALT 32 12/21/2019   PROT 6.7 12/21/2019   ALBUMIN 3.5 12/21/2019   CALCIUM 9.7 01/10/2020   ANIONGAP 10 01/10/2020   Lab Results  Component Value Date   CHOL 232 (H) 12/06/2019   Lab Results  Component Value Date   HDL 42 12/06/2019   Lab Results  Component Value Date   LDLCALC UNABLE TO CALCULATE IF TRIGLYCERIDE OVER 400 mg/dL 12/06/2019   Lab Results  Component Value Date   TRIG 294 (H) 12/13/2019   Lab Results  Component Value Date   CHOLHDL 5.5 12/06/2019   Lab Results  Component Value Date   HGBA1C 5.6 12/06/2019      Assessment & Plan:   Problem List Items Addressed This Visit      Cardiovascular and Mediastinum   Essential hypertension - Primary     Nervous and Auditory   Left hemiparesis ([place])     Other   History  of CVA (cerebrovascular accident)      No orders of the defined types were placed in this encounter.   Follow-up: Return in about 2 months (around 03/20/2020).   Continue all medications for now.  May be able to discontinue Keppra with neurology's guidance.  Will adjust blood pressure meds and statin as needed in 2 months.  Continue work with physical therapy.  No need unknown for talking about therapy okay if today if you Tylenol up Libby Maw, MD

## 2020-01-21 ENCOUNTER — Ambulatory Visit: Payer: BC Managed Care – PPO | Admitting: Occupational Therapy

## 2020-01-21 ENCOUNTER — Ambulatory Visit: Payer: BC Managed Care – PPO

## 2020-01-21 ENCOUNTER — Other Ambulatory Visit: Payer: Self-pay

## 2020-01-21 DIAGNOSIS — I69354 Hemiplegia and hemiparesis following cerebral infarction affecting left non-dominant side: Secondary | ICD-10-CM | POA: Diagnosis not present

## 2020-01-21 DIAGNOSIS — M6281 Muscle weakness (generalized): Secondary | ICD-10-CM | POA: Diagnosis not present

## 2020-01-21 DIAGNOSIS — R2689 Other abnormalities of gait and mobility: Secondary | ICD-10-CM | POA: Diagnosis not present

## 2020-01-21 DIAGNOSIS — R41842 Visuospatial deficit: Secondary | ICD-10-CM

## 2020-01-21 DIAGNOSIS — R41841 Cognitive communication deficit: Secondary | ICD-10-CM

## 2020-01-21 DIAGNOSIS — R209 Unspecified disturbances of skin sensation: Secondary | ICD-10-CM | POA: Diagnosis not present

## 2020-01-21 DIAGNOSIS — R2681 Unsteadiness on feet: Secondary | ICD-10-CM | POA: Diagnosis not present

## 2020-01-21 DIAGNOSIS — R414 Neurologic neglect syndrome: Secondary | ICD-10-CM | POA: Diagnosis not present

## 2020-01-21 DIAGNOSIS — R208 Other disturbances of skin sensation: Secondary | ICD-10-CM

## 2020-01-21 DIAGNOSIS — R29818 Other symptoms and signs involving the nervous system: Secondary | ICD-10-CM | POA: Diagnosis not present

## 2020-01-21 NOTE — Therapy (Signed)
[facility] [address] [place], [place], [address] Phone: [phone]   Fax:  [phone]  Occupational Therapy Evaluation  Patient Details  Name: Dennis Mercado MRN: [id number] Date of Birth: 1980/10/25 Referring Provider (OT): Dr. Letta Pate   Encounter Date: 01/21/2020   OT End of Session - 01/21/20 1558    Visit Number 1    Number of Visits 25   POC written for 12 weeks, 2x week will need to adjust freq due to insurance   Date for OT Re-Evaluation 04/20/20    Authorization Type BCBS    Authorization Time Period 90 days- 30 VL combined for OT/ PT, will adjust frequency accordingly    Authorization - Visit Number 1    Authorization - Number of Visits 15    OT Start Time 0720    OT Stop Time 0758    OT Time Calculation (min) 38 min    Behavior During Therapy [facility] for tasks assessed/performed           No past medical history on file.  No past surgical history on file.  There were no vitals filed for this visit.   Subjective Assessment - 01/21/20 0723    Pertinent History Pt. presented to ED on 12/06/2019 with left side weakness and  altered mental status s/p coitus.  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 Pt required intubation 6/22 due to ARF secondary to aspiration PNA. Received inpatient rehab. Pt was discharged 01/12/20   Limitations HTN, left inattention    Patient Stated Goals to be able to use my hand    Currently in Pain? 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 Letta Pate    Onset Date/Surgical Date 12/06/19    Hand Dominance Right    Prior Therapy CIR PT, OT and ST      Precautions   Precautions Fall;Other (comment)    Precaution Comments L inattention      Balance  Screen   Has the patient fallen in the past 6 months No    Has the patient had a decrease in activity level because of a fear of falling?  No    Is the patient reluctant to leave their home because of a fear of falling?  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) - take her on walks again, dancing (roomba and ballroom), go to [employer] and walking on the sand      ADL   Eating/Feeding Needs assist with cutting food    Grooming Modified independent    Upper Body Bathing Minimal assistance    Lower Body Bathing Minimal assistance    Upper Body Dressing Moderate assistance    Lower Body Dressing Maximal assistance   mod A with pants, max A with shoes/socks    Toilet Transfer Minimal assistance    Tub/Shower Transfer Minimal assistance      Written Expression   Dominant Hand Right  Vision Assessment   Tracking/Visual Pursuits Decreased smoothness of horizontal tracking    Visual Fields Left visual field deficit    Comment L inattention, pt completed 1.5 M number cancellation missing 50% of items all on L side, he did not mark any items to far left      Cognition   Overall Cognitive Status Impaired/Different from baseline    Area of Impairment Attention;Memory;Safety/judgement;Awareness;Problem solving    Memory Impaired    Memory Impairment Decreased short term memory    Awareness Impaired    Awareness Impairment Intellectual impairment    Executive Function Organizing    Cognition Comments Pt does not demonstrate awareness of significant L inattention/ visual deficit      Sensation   Light Touch Impaired by gross assessment;Impaired Detail    Light Touch Impaired Details Impaired LUE      Coordination   Fine Motor Movements are Fluid and Coordinated No     Coordination unable to move left hand      Tone   Assessment Location Left Upper Extremity      ROM / Strength   AROM / PROM / Strength AROM      AROM   Overall AROM  Deficits    Overall AROM Comments LUE shoulder abduction 30*, grossly 25% elbow flexion, 10% extension, no active finger movement pressent      LUE Tone   LUE Tone Hypertonic      LUE Tone   Hypertonic Details Mild                             OT Short Term Goals - 01/21/20 1541      OT SHORT TERM GOAL #1   Title I with inital HEP    Time 6    Period Weeks    Status New      OT SHORT TERM GOAL #2   Title Pt will donn shirt with supervision and min v.c, and pants with min A    Time 6    Period Weeks    Status New      OT SHORT TERM GOAL #3   Title Pt will attend to items in L hemibody space with no more than min v.c    Time 6    Period Weeks    Status New      OT SHORT TERM GOAL #4   Title Pt will use LUE as stabilizer 10%of the time with min v.c    Time 6    Period Weeks    Status New      OT SHORT TERM GOAL #5   Title Pt will transfer to toilet with close supervison    Time 6    Period Weeks    Status New             OT Long Term Goals - 01/21/20 1544      OT LONG TERM GOAL #1   Title I with updated HEP    Time 12    Period Weeks    Status New      OT LONG TERM GOAL #2   Title Pt will donn shirt mod I and perfrom LB dressing with min A    Time 12    Period Weeks    Status New      OT LONG TERM GOAL #3   Title Pt. will bathe with min A    Time 12  Period Weeks    Status New      OT LONG TERM GOAL #4   Title Pt will demonstrate 40* A/ROM shoulder flexion in prep for reach    Time 12    Period Weeks    Status New      OT LONG TERM GOAL #5   Title Pt will perform tabletop scanning activities with 75%better accuracy    Time 12    Period Weeks    Status New      OT LONG TERM GOAL #6   Title Pt will demonstrate 50% elbow flexion/ extension in LUE   for functional use.    Time 12    Period Weeks    Status New                 Plan - 01/21/20 1533    Clinical Impression Statement Patient is a 39 year old male referred to Neuro OPOT s/p right thalamic hemorrhage. Pt. presented to ED on 12/06/2019 with left side weakness and  altered mental status s/p coitus. Pt required intubation 6/22 due to ARF secondary to aspiration PNA. Received inpatient rehab and was discharged from hospital 01/12/20.  PMH HTN, drug screen positive for marijanna The following deficits were present during the eval:  cognitive deficits, L hemiplegia, left inattention, visual deficits, decreased functional mobility, and decreased strength Pt will benefit from skilled occupational therapy to address these deficits in order to maximize pt's safety and I with ADLs/IADLs. Pt lives with his wife who is taking time off until October.    OT Occupational Profile and History Detailed Assessment- Review of Records and additional review of physical, cognitive, psychosocial history related to current functional performance    Occupational performance deficits (Please refer to evaluation for details): ADL's;IADL's;Leisure;Social Participation;Work;Play    Body Structure / Function / Physical Skills ADL;Balance;Endurance;Mobility;Strength;UE functional use;FMC;Vision;Coordination;Gait;Decreased knowledge of precautions;GMC;ROM;Decreased knowledge of use of DME;Dexterity;IADL;Sensation    Cognitive Skills Attention;Memory;Problem Solve;Safety Awareness;Thought;Understand    Rehab Potential Fair    Clinical Decision Making Limited treatment options, no task modification necessary    Comorbidities Affecting Occupational Performance: May have comorbidities impacting occupational performance    Modification or Assistance to Complete Evaluation  No modification of tasks or assist necessary to complete eval    OT Frequency 2x / week   plus eval   OT Duration 12 weeks    OT  Treatment/Interventions Self-care/ADL training;Therapeutic exercise;Functional Mobility Training;Balance training;Splinting;Manual Therapy;Neuromuscular education;Aquatic Therapy;Ultrasound;Energy conservation;Therapeutic activities;Cryotherapy;Paraffin;DME and/or AE instruction;Cognitive remediation/compensation;Visual/perceptual remediation/compensation;Gait Training;Fluidtherapy;Electrical Stimulation;Moist Heat;Contrast Bath;Passive range of motion;Patient/family education    Plan Hemi ADL strategies, NMR to LUE, assess Saebo issued at hospital for appropriate use.    Consulted and Agree with Plan of Care Patient;Family member/caregiver           Patient will benefit from skilled therapeutic intervention in order to improve the following deficits and impairments:   Body Structure / Function / Physical Skills: ADL, Balance, Endurance, Mobility, Strength, UE functional use, FMC, Vision, Coordination, Gait, Decreased knowledge of precautions, GMC, ROM, Decreased knowledge of use of DME, Dexterity, IADL, Sensation Cognitive Skills: Attention, Memory, Problem Solve, Safety Awareness, Thought, Understand     Visit Diagnosis: Hemiplegia and hemiparesis following cerebral infarction affecting left non-dominant side (HCC)  Other symptoms and signs involving the nervous system  Other abnormalities of gait and mobility  Muscle weakness (generalized)  Visuospatial deficit  Neurologic neglect syndrome  Unsteadiness on feet  Other disturbances of skin sensation    Problem List Patient Active Problem  List   Diagnosis Date Noted  . History of CVA (cerebrovascular accident) 01/19/2020  . Normocytic anemia   . Benign essential HTN   . Hyponatremia   . Acute blood loss anemia   . Hypotension due to drugs   . Vascular headache   . Prerenal azotemia   . Sleep disturbance   . Leukocytosis   . Transaminitis   . Marijuana abuse   . Essential hypertension   . Dysphagia, post-stroke   .  Left hemiparesis ([place])   . Acute respiratory failure ([place])   . ICH (intracerebral hemorrhage) ([place]) 12/06/2019    Melana Hingle 01/21/2020, 4:03 PM Theone Murdoch, OTR/L [phone] Phone: [phone] 4:03 PM 01/21/20 [facility] [address] [place], [place], [address] Phone: [phone]   Fax:  [phone]  Name: Dennis Mercado MRN: [id number] Date of Birth: 1980/11/04

## 2020-01-22 NOTE — Therapy (Signed)
[name] [address] [place], [place], [address] Phone: [phone]   Fax:  [phone]  Speech Language Pathology Evaluation  Patient Details  Name: Dennis Mercado MRN: [id number] Date of Birth: Jan 20, 1981 Referring Provider (SLP): Marlowe Shores, [place]   Encounter Date: 01/21/2020   End of Session - 01/21/20 2352    Visit Number 1    Number of Visits 25    Date for SLP Re-Evaluation 04/20/20   90 days   SLP Start Time 0805    SLP Stop Time  0850    SLP Time Calculation (min) 45 min    Activity Tolerance Patient tolerated treatment well           History reviewed. No pertinent past medical history.  History reviewed. No pertinent surgical history.  There were no vitals filed for this visit.   Subjective Assessment - 01/21/20 0811    Subjective "She told me I was having some difficulty with my left side."    Currently in Pain? No/denies              SLP Evaluation [facility] - 01/21/20 1324      SLP Visit Information   SLP Received On 01/21/20    Referring Provider (SLP) Marlowe Shores, PA    Onset Date 12-06-19    Medical Diagnosis CVA      Subjective   Patient/Family Stated Goal "Putting stuff together. 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   HPI Pt with CVA early in AM on 12-06-19 centered in rt thalamus, extending into the ventricles with local mass effect without midline shift. Pt intubated 6-22- to 12-10-19 thought due to asp PNA. On CIR for approx 3 weeks, d/c 01-11-20.       Balance Screen   Has the patient fallen in the past 6 months No      Prior Functional Status   Cognitive/Linguistic Baseline Within functional limits    Type of Home House     Lives With Spouse    Available Support Family    Vocation Full time employment   supervisor at [employer], scheduling     Cognition   Overall Cognitive Status Within Functional Limits for tasks assessed     Area of Impairment Attention;Awareness;Problem solving;Memory    Current Attention Level Sustained    Attention Comments Internal distraction makes sustained and selective attention challenging for pt    Memory Comments pt with adequate immediate recall in story, however missed 1/16 questions re: details - SLP ?s if memory is attention based.    Awareness Intellectual    Awareness Comments Pt with lt inattention in OT session - pt explained away as "I hadn't gotten to that side yet.". In ST noted this as a deficit with decr'd detail (see "s" statement)    Attention Selective    Selective Attention --    Memory --    Memory Impairment --    Awareness Impaired    Executive Function Organizing;Self Correcting      Auditory Comprehension   Overall Auditory Comprehension Appears within functional limits for tasks assessed      Verbal Expression   Overall Verbal Expression Appears within functional limits for tasks assessed      Oral Motor/Sensory Function   Overall Oral Motor/Sensory Function Appears within functional limits for tasks assessed      Motor Speech   Overall Motor Speech Appears  within functional limits for tasks assessed      Standardized Assessments   Standardized Assessments  Cognitive Linguistic Quick Test                           SLP Education - 01/21/20 2351    Education Details eval results so far (attention, memory, awareness), complete eval next session    Person(s) Educated Patient;Spouse    Methods Explanation;Demonstration    Comprehension Verbalized understanding;Need further instruction            SLP Short Term Goals - 01/21/20 2358      SLP SHORT TERM GOAL #1   Title pt will initiate use of memory compensations in 3 sessions    Time 4    Period Weeks   or 9 sessions   Status New      SLP SHORT TERM GOAL #2   Title pt will use memory compensations successfully between/in 3 sessions    Time 6   or 13 sessions   Period Weeks     Status New      SLP SHORT TERM GOAL #3   Title pt will tell SLP 3 cognitive linguistic deficits in 2 sessions    Time 4   or9 sessions   Period Weeks    Status New      SLP SHORT TERM GOAL #4   Title pt will demonstrate error awareness in therapy tasks 100% with spontaneous re-check in 3 sessions    Time 6   or 13 sessions   Period Weeks    Status New      SLP SHORT TERM GOAL #5   Title pt will demo selective attention in mod noisy environment for 10 minutes while completing simple-mod complex therapy task/s in 3 sessions    Time 6    Period Weeks   or 13 sessions   Status New            SLP Long Term Goals - 01/22/20 0004      SLP LONG TERM GOAL #1   Title pt will demonstrate WFL attention and problem solving skills 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) to prepare for return to work 100% with self-corrections in 3 sessions    Time 12    Period Weeks   or 25 sessions, for all LTGs   Status New      SLP LONG TERM GOAL #2   Title pt will utilize functional  memory compensations successfully between therapy tasks reported by pt and/or wife between 4 sessions    Time 12    Period Weeks    Status New      SLP LONG TERM GOAL #3   Title pt will demonstrate divided attention in two simple therapy tasks in 3 sessions    Time 8    Period Weeks    Status New      SLP LONG TERM GOAL #4   Title pt will use compensations for attention successfully PRN in 2 sessions    Time [place] - 01/21/20 0819    Clinical Impression Statement Pt presents today with cognitive linguistic deficits in areas of attention, awareness, memory, executive function, and problem solving. 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 Wife stated pt paid bills yesterday with supervision for  accuracy. Pt has some splinter skills with awareness and attention/memory however severity could not be measured today due to time constraints. CLQT will be completed next session. I recommend skilled ST to target deficit areas mentioned before, in order to return to PLOF.    Speech Therapy Frequency 2x / week    Duration --   12 weeks, or 25 total visits   Treatment/Interventions Internal/external aids;Patient/family education;Compensatory strategies;Cognitive reorganization;Cueing hierarchy;SLP instruction and feedback;Functional tasks;Environmental controls    Potential to Achieve Goals Good    Potential Considerations Cooperation/participation level   awareness          Patient will benefit from skilled therapeutic intervention in order to improve the following deficits and impairments:   Cognitive communication deficit    Problem List Patient Active Problem List   Diagnosis Date Noted  . History of CVA (cerebrovascular accident) 01/19/2020  . Normocytic anemia   . Benign essential HTN   . Hyponatremia   . Acute blood loss anemia   . Hypotension due to drugs   . Vascular headache   . Prerenal azotemia   . Sleep disturbance   . Leukocytosis   . Transaminitis   . Marijuana abuse   . Essential hypertension   . Dysphagia, post-stroke   . Left hemiparesis ([place])   . Acute respiratory failure ([place])   . ICH (intracerebral hemorrhage) ([place]) 12/06/2019    [facility] ,[place], [place]  01/22/2020, 12:09 AM  [facility] [address] [place], [place], [address] Phone: [phone]   Fax:  [phone]  Name: Rayburn Mundis MRN: [id number] Date of Birth: 1981-03-19

## 2020-01-24 ENCOUNTER — Ambulatory Visit: Payer: BC Managed Care – PPO | Admitting: Physical Therapy

## 2020-01-24 ENCOUNTER — Encounter: Payer: Self-pay | Admitting: Occupational Therapy

## 2020-01-24 ENCOUNTER — Other Ambulatory Visit: Payer: Self-pay

## 2020-01-24 ENCOUNTER — Ambulatory Visit: Payer: BC Managed Care – PPO | Admitting: Occupational Therapy

## 2020-01-24 DIAGNOSIS — I69354 Hemiplegia and hemiparesis following cerebral infarction affecting left non-dominant side: Secondary | ICD-10-CM

## 2020-01-24 DIAGNOSIS — R2689 Other abnormalities of gait and mobility: Secondary | ICD-10-CM

## 2020-01-24 DIAGNOSIS — R29818 Other symptoms and signs involving the nervous system: Secondary | ICD-10-CM

## 2020-01-24 DIAGNOSIS — R41841 Cognitive communication deficit: Secondary | ICD-10-CM | POA: Diagnosis not present

## 2020-01-24 DIAGNOSIS — R414 Neurologic neglect syndrome: Secondary | ICD-10-CM

## 2020-01-24 DIAGNOSIS — R41842 Visuospatial deficit: Secondary | ICD-10-CM | POA: Diagnosis not present

## 2020-01-24 DIAGNOSIS — R2681 Unsteadiness on feet: Secondary | ICD-10-CM

## 2020-01-24 DIAGNOSIS — M6281 Muscle weakness (generalized): Secondary | ICD-10-CM | POA: Diagnosis not present

## 2020-01-24 DIAGNOSIS — R208 Other disturbances of skin sensation: Secondary | ICD-10-CM

## 2020-01-24 DIAGNOSIS — R209 Unspecified disturbances of skin sensation: Secondary | ICD-10-CM | POA: Diagnosis not present

## 2020-01-24 NOTE — Patient Instructions (Signed)
Access Code: [email or web address] URL: [email or web address] Date: 01/24/2020 Prepared by: Janann August  Exercises Supine Bridge - 1-2 x daily - 1-2 sets - 10 reps - 5-10 sec hold Supine March - 1-2 x daily - 1-2 sets - 10 reps Supine Heel Slide - 1-2 x daily - 1-2 sets - 10 reps Supine Hip Abduction - 1-2 x daily - 1-2 sets - 10 reps Supine Short Arc Quad - 1-2 x daily - 5 x weekly - 2 sets - 5 reps Supine Hip Adduction Isometric with Ball - 1-2 x daily - 5 x weekly - 2 sets - 10 reps Supine Hamstring Stretch with Caregiver - 2 x daily - 7 x weekly - 3 sets - 30 hold Supine Ankle Dorsiflexion Stretch with Caregiver - 2 x daily - 7 x weekly - 3 sets - 30 hold

## 2020-01-24 NOTE — Therapy (Signed)
[facility] [address] [place], [place], [address] Phone: [phone]   Fax:  [phone]  Physical Therapy Treatment  Patient Details  Name: Dennis Mercado MRN: [id number] Date of Birth: Apr 16, 1981 Referring Provider (PT): Angiulli, Chapman Fitch (will be followed by Dr. Letta Pate)   Encounter Date: 01/24/2020   PT End of Session - 01/24/20 0851    Visit Number 2    Number of Visits 15    Date for PT Re-Evaluation 03/18/20    Authorization Type BCBS, VL for PT and OT = 30 (zero used, if seen on same day, will count as 2 visits), VL for ST = 30    Authorization - Visit Number 2    Authorization - Number of Visits 30    PT Start Time 0802    PT Stop Time 0848    PT Time Calculation (min) 46 min    Equipment Utilized During Treatment Gait belt    Activity Tolerance Patient tolerated treatment well    Behavior During Therapy [facility] for tasks assessed/performed           No past medical history on file.  No past surgical history on file.  There were no vitals filed for this visit.   Subjective Assessment - 01/24/20 0807    Subjective No changes since he was last here. Had a busy weekend. Been outside a little bit.    Pertinent History HTN    Patient Stated Goals wants to start walking as soon as possible - "less walker and downgrade to a cane and then nothing at all"    Currently in Pain? Yes    Pain Score 6     Pain Location Wrist    Pain Orientation Left    Pain Type Acute pain    Aggravating Factors  sleeping on his side.    Pain Relieving Factors repositioning.                        Access Code: [email or web address] URL: [email or web address] Date: 01/24/2020 Prepared by: Janann August  Initiated pt's HEP with pt's spouse present throughout to learn technique and where to provide assistance. Also educated on purpose of each exercise/stretch.   Exercises Supine Bridge - 1-2 x daily -  1-2 sets - 10 reps - 5-10 sec hold -cues to lift up first with L hip, needing intermittent assist to maintain LLE  In position  Supine March - 1-2 x daily - 1-2 sets - 10 reps Supine Heel Slide - 1-2 x daily - 1-2 sets - 10 reps Supine Hip Abduction - 1-2 x daily - 1-2 sets - 10 reps - with therapist assisting to maintain LLE in proper alignment  Supine Short Arc Quad - 1-2 x daily - 5 x weekly - 2 sets - 5 reps - cues to keep back of L knee onto bolster when performing  Supine Hip Adduction Isometric with Ball - 1-2 x daily - 5 x weekly - 2 sets - 10 reps Supine Hamstring Stretch with Caregiver - 2 x daily - 7 x weekly - 3 sets - 30 hold - and after performing hamstring stretch, bringing LLE across body for IT band strech (discussed with wife how to perform at home) Supine Ankle Dorsiflexion Stretch with Caregiver - 2 x daily - 7 x weekly - 3 sets - 30 hold       [facility] Adult PT Treatment/Exercise - 01/24/20 0001  Transfers   Squat Pivot Transfers 4: Min assist    Squat Pivot Transfer Details (indicate cue type and reason) from w/c <> mat table, 1st rep towards pt's R, 2nd rep towards pt's L, with therapist providing min A at L knee                  PT Education - 01/24/20 0851    Education Details initial HEP for strengthening and stretching LLE    Person(s) Educated Patient;Spouse    Methods Explanation;Demonstration;Handout    Comprehension Verbalized understanding;Returned demonstration;Verbal cues required;Tactile cues required            PT Short Term Goals - 01/18/20 5361      PT SHORT TERM GOAL #1   Title Pt and pt's spouse will be independent with initial HEP in order to build upon functional gains made in therapy. ALL STGS DUE 02/15/20    Time 4    Period Weeks    Status New    Target Date 02/15/20      PT SHORT TERM GOAL #2   Title Pt will perform squat pivot vs. stand pivot with supervision in order to improve functional mobility and decr caregiver  burden.    Time 4    Period Weeks    Status New      PT SHORT TERM GOAL #3   Title Pt will perform sit <> stands with RW with supervision and pt able to place LUE in L hand orthosis with supervision in order to improve functional mobility.    Time 4    Period Weeks    Status New      PT SHORT TERM GOAL #4   Title Pt will ambulate at least 100' with RW and min guard in order to improve household mobility.    Time 4    Period Weeks    Status New      PT SHORT TERM GOAL #5   Title Pt will perform standing balance with single UE support at sink for 5 minutes with supervision in order to incr tolerance for ADLs.    Time 4    Period Weeks    Status New             PT Long Term Goals - 01/18/20 0941      PT LONG TERM GOAL #1   Title Pt and pt's spouse will be independent with final HEP in order to build upon functional gains made in therapy. ALL LTGS DUE 03/14/20    Time 8    Period Weeks    Status New    Target Date 03/14/20      PT LONG TERM GOAL #2   Title Pt will ambulate at least 230' with RW vs. LRAD and supervision in order to improve functional mobility.    Time 8    Period Weeks    Status New      PT LONG TERM GOAL #3   Title Pt will perform all bed mobility with mod I    Baseline 8    Period Weeks    Status New      PT LONG TERM GOAL #4   Title Pt will perform 2 steps with single HHA vs. LRAD with min guard in order to safely enter home.    Time 8    Period Weeks    Status New      PT LONG TERM GOAL #5   Title Pt will perform  all transfers with mod I in order to decr caregiver burden and improve functional mobility.    Time 8    Period Weeks    Status New                 Plan - 01/24/20 0855    Clinical Impression Statement Focus of today's skilled session was initiating pt's HEP in supine for LLE strengthening and stretching with assist of pt's spouse. Pt needing tactile and verbal cues with how to perform correctly and needing intermittent  assist from therapist for LLE to remain in proper position. Pt reporting feeling relief from supine hamstring and IT band stretch. Will continue to progress towards LTGs.    Personal Factors and Comorbidities Comorbidity 1;Past/Current Experience;Age;Profession    Comorbidities HTN    Examination-Activity Limitations Bathing;Bed Mobility;Carry;Hygiene/Grooming;Stand;Stairs;Squat;Transfers;Locomotion Level    Examination-Participation Restrictions Community Activity;Occupation   walking his dog   Stability/Clinical Decision Making Evolving/Moderate complexity    Rehab Potential Good    PT Frequency 2x / week    PT Duration --   7 weeks   PT Treatment/Interventions ADLs/Self Care Home Management;Aquatic Therapy;Electrical Stimulation;DME Instruction;Gait training;Stair training;Functional mobility training;Therapeutic activities;Therapeutic exercise;Balance training;Neuromuscular re-education;Wheelchair mobility training;Orthotic Fit/Training;Patient/family education;Passive range of motion;Energy conservation;Vestibular    PT Next Visit Plan how was HEP for home?  pre-gait/gait training with use of RW and L hand orthosisNMR for weightshifting through LLE, transfer training, standing at countertop for weight bearing with use of givmohr sling?    PT Home Exercise Plan [email or web address]    Consulted and Agree with Plan of Care Patient;Family member/caregiver    Family Member Consulted wife, April           Patient will benefit from skilled therapeutic intervention in order to improve the following deficits and impairments:  Abnormal gait, Decreased activity tolerance, Decreased balance, Decreased mobility, Decreased knowledge of use of DME, Decreased coordination, Decreased range of motion, Decreased strength, Difficulty walking, Impaired tone, Impaired sensation, Postural dysfunction  Visit Diagnosis: Hemiplegia and hemiparesis following cerebral infarction affecting left non-dominant side (HCC)  Other  symptoms and signs involving the nervous system  Other abnormalities of gait and mobility  Muscle weakness (generalized)     Problem List Patient Active Problem List   Diagnosis Date Noted   History of CVA (cerebrovascular accident) 01/19/2020   Normocytic anemia    Benign essential HTN    Hyponatremia    Acute blood loss anemia    Hypotension due to drugs    Vascular headache    Prerenal azotemia    Sleep disturbance    Leukocytosis    Transaminitis    Marijuana abuse    Essential hypertension    Dysphagia, post-stroke    Left hemiparesis ([place])    Acute respiratory failure (HCC)    ICH (intracerebral hemorrhage) ([place]) 12/06/2019    Arliss Journey, PT, DPT  01/24/2020, 8:58 AM  [facility] [address] [place], [place], [address] Phone: [phone]   Fax:  [phone]  Name: Tsuneo Faison MRN: [id number] Date of Birth: [birth year]

## 2020-01-24 NOTE — Therapy (Signed)
[facility] [address] [place], [place], [address] Phone: [phone]   Fax:  [phone]  Occupational Therapy Treatment  Patient Details  Name: Dennis Mercado MRN: [id number] Date of Birth: 1980-07-16 Referring Provider (OT): Dr. Letta Pate   Encounter Date: 01/24/2020   OT End of Session - 01/24/20 1538    Visit Number 2    Number of Visits 25   POC written for 12 weeks, 2x week will need to adjust freq due to insurance   Date for OT Re-Evaluation 04/20/20    Authorization Type BCBS    Authorization Time Period 90 days- 30 VL combined for OT/ PT, will adjust frequency accordingly    Authorization - Visit Number 2    Authorization - Number of Visits 15    OT Start Time [id number]    OT Stop Time 0802    OT Time Calculation (min) 39 min    Activity Tolerance Patient tolerated treatment well    Behavior During Therapy [facility] for tasks assessed/performed           History reviewed. No pertinent past medical history.  History reviewed. No pertinent surgical history.  There were no vitals filed for this visit.   Subjective Assessment - 01/24/20 1243    Subjective  "my wrist was hurting this morning"    Pertinent History Pt. presented to ED on 12/06/2019 with left side weakness and  altered mental status s/p coitus.  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 Pt required intubation 6/22 due to ARF secondary to aspiration PNA. Received inpatient rehab. Pt was discharged 7/28/2    Limitations HTN, left inattention    Patient Stated Goals to be able to use my hand    Currently in Pain? No/denies               Began reviewing/updating HEP issued from hospital due to progress and with cueing for proper positioning to decr risk of pain and decr flexor synergy pattern:  Table slides (forward, circles) using RUE to guide LUE movement,  AAROM supination, elbow flex/ext with elbow on table and avoiding shoulder hike.        OT Education - 01/24/20 1244    Education Details Proper positioning of LUE/Bed positioning to decr risk of pain/injury.  Checked existing resting hand splint (pre-fab) positioning and adjusted thumb piece and review proper position/wear.  Checked [employer] electrical stimulation unit that pt received while in hospital:  Pt has been placing on shoulder for subluxation and on finger extensors--Educated pt/wife on proper placement of electrodes for finger extension and results that they should look for (avoid wrist deviation and improved finger ext of thumb and 2nd digit) as placement that pt had been using is not ideal.  Began review and update of HEP (using pt's hospital handouts to make updates).  Recommended only using Givmohr sling when walking (will wait for recommendations from PT regarding which device) and not to wear while sitting.    Person(s) Educated Patient;Spouse    Methods Explanation;Demonstration;Verbal cues;Handout    Comprehension Verbalized understanding;Returned demonstration;Verbal cues required            OT Short Term Goals - 01/21/20 1541      OT SHORT TERM GOAL #1   Title I with inital HEP    Time 6    Period Weeks    Status New      OT SHORT TERM  GOAL #2   Title Pt will donn shirt with supervision and min v.c, and pants with min A    Time 6    Period Weeks    Status New      OT SHORT TERM GOAL #3   Title Pt will attend to items in L hemibody space with no more than min v.c    Time 6    Period Weeks    Status New      OT SHORT TERM GOAL #4   Title Pt will use LUE as stabilizer 10%of the time with min v.c    Time 6    Period Weeks    Status New      OT SHORT TERM GOAL #5   Title Pt will transfer to toilet with close supervison    Time 6    Period Weeks    Status New             OT Long Term Goals - 01/21/20 1544      OT LONG TERM GOAL #1   Title I with  updated HEP    Time 12    Period Weeks    Status New      OT LONG TERM GOAL #2   Title Pt will donn shirt mod I and perfrom LB dressing with min A    Time 12    Period Weeks    Status New      OT LONG TERM GOAL #3   Title Pt. will bathe with min A    Time 12    Period Weeks    Status New      OT LONG TERM GOAL #4   Title Pt will demonstrate 40* A/ROM shoulder flexion in prep for reach    Time 12    Period Weeks    Status New      OT LONG TERM GOAL #5   Title Pt will perform tabletop scanning activities with 75%better accuracy    Time 12    Period Weeks    Status New      OT LONG TERM GOAL #6   Title Pt will demonstrate 50% elbow flexion/ extension in LUE  for functional use.    Time 12    Period Weeks    Status New                 Plan - 01/24/20 1538    Clinical Impression Statement Pt/wife verbalize understanding of education provided. Pt appears very motived for improved LUE movement and use.    OT Occupational Profile and History Detailed Assessment- Review of Records and additional review of physical, cognitive, psychosocial history related to current functional performance    Occupational performance deficits (Please refer to evaluation for details): ADL's;IADL's;Leisure;Social Participation;Work;Play    Body Structure / Function / Physical Skills ADL;Balance;Endurance;Mobility;Strength;UE functional use;FMC;Vision;Coordination;Gait;Decreased knowledge of precautions;GMC;ROM;Decreased knowledge of use of DME;Dexterity;IADL;Sensation    Cognitive Skills Attention;Memory;Problem Solve;Safety Awareness;Thought;Understand    Rehab Potential Fair    Clinical Decision Making Limited treatment options, no task modification necessary    Comorbidities Affecting Occupational Performance: May have comorbidities impacting occupational performance    Modification or Assistance to Complete Evaluation  No modification of tasks or assist necessary to complete eval    OT  Frequency 2x / week   plus eval   OT Duration 12 weeks    OT Treatment/Interventions Self-care/ADL training;Therapeutic exercise;Functional Mobility Training;Balance training;Splinting;Manual Therapy;Neuromuscular education;Aquatic Therapy;Ultrasound;Energy conservation;Therapeutic activities;Cryotherapy;Paraffin;DME and/or AE instruction;Cognitive remediation/compensation;Visual/perceptual remediation/compensation;Gait  Training;Fluidtherapy;Electrical Stimulation;Moist Heat;Contrast Bath;Passive range of motion;Patient/family education    Plan continue review/update of hospital issued HEP with emphasis on positioning; neuro re-ed of LUE; hemi-techniques for ADLs    Consulted and Agree with Plan of Care Patient;Family member/caregiver           Patient will benefit from skilled therapeutic intervention in order to improve the following deficits and impairments:   Body Structure / Function / Physical Skills: ADL, Balance, Endurance, Mobility, Strength, UE functional use, FMC, Vision, Coordination, Gait, Decreased knowledge of precautions, GMC, ROM, Decreased knowledge of use of DME, Dexterity, IADL, Sensation Cognitive Skills: Attention, Memory, Problem Solve, Safety Awareness, Thought, Understand     Visit Diagnosis: Hemiplegia and hemiparesis following cerebral infarction affecting left non-dominant side (HCC)  Other symptoms and signs involving the nervous system  Other abnormalities of gait and mobility  Muscle weakness (generalized)  Visuospatial deficit  Neurologic neglect syndrome  Unsteadiness on feet  Other disturbances of skin sensation    Problem List Patient Active Problem List   Diagnosis Date Noted  . History of CVA (cerebrovascular accident) 01/19/2020  . Normocytic anemia   . Benign essential HTN   . Hyponatremia   . Acute blood loss anemia   . Hypotension due to drugs   . Vascular headache   . Prerenal azotemia   . Sleep disturbance   . Leukocytosis   .  Transaminitis   . Marijuana abuse   . Essential hypertension   . Dysphagia, post-stroke   . Left hemiparesis ([place])   . Acute respiratory failure ([place])   . ICH (intracerebral hemorrhage) ([place]) 12/06/2019    [facility] 01/24/2020, 3:41 PM  [place] [address] [place], [place], [address] Phone: [phone]   Fax:  [phone]  Name: Dennis Mercado MRN: [id number] Date of Birth: Aug 04, 1980   Vianne Bulls, OTR/L [facility] [address] [place], [place]  [address] [phone] phone [phone] 01/24/20 3:45 PM

## 2020-01-25 ENCOUNTER — Encounter: Payer: Self-pay | Admitting: Physical Medicine & Rehabilitation

## 2020-01-25 ENCOUNTER — Encounter
Payer: BC Managed Care – PPO | Attending: Physical Medicine & Rehabilitation | Admitting: Physical Medicine & Rehabilitation

## 2020-01-25 VITALS — BP 114/79 | HR 79 | Temp 98.7°F | Ht 67.0 in | Wt 139.0 lb

## 2020-01-25 DIAGNOSIS — G8114 Spastic hemiplegia affecting left nondominant side: Secondary | ICD-10-CM | POA: Insufficient documentation

## 2020-01-25 DIAGNOSIS — Z8673 Personal history of transient ischemic attack (TIA), and cerebral infarction without residual deficits: Secondary | ICD-10-CM | POA: Insufficient documentation

## 2020-01-25 NOTE — Patient Instructions (Signed)
Please call if Left hand tightens up to schedule Botox

## 2020-01-25 NOTE — Progress Notes (Signed)
Subjective:    Patient ID: Dennis Mercado, male    DOB: [birth year], 39 y.o.   MRN: [id number]  39 y.o. right-handed male with documented history of hypertension on no current antihypertensive medications.  History taken from chart and wife due to cognition.  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  1 level home one-step to entry.  Presented 12/06/2019 the left side weakness altered mental status during sexual activity.  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  Per report measuring 3.6 x 2.8 x 2.6 cm.  Hemorrhage extending into the ventricles and could be seen in the third and fourth ventricles without hydrocephalus.  Local mass-effect without midline shift.  CT angiogram of head and neck normal variant.  CTA circle of Willis without significant proximal stenosis aneurysm or branch vessel occlusion.  Admission chemistries potassium 3.2 creatinine 1.52 urine drug screen positive marijuana.  Echocardiogram with ejection fraction of 65% no wall motion abnormalities.  EEG negative for seizure.  Carotid Dopplers unremarkable.  Venous Doppler studies lower extremities negative for DVT.  Follow-up cranial CT scan stable.  Unchanged size of right thalamic intraparenchymal hematoma with mild surrounding edema.  Hospital course complicated by fevers presumed to be secondary to large left lung opacity concerning for pneumonia he completed a course of Unasyn.  Tolerating a dysphagia #2 thin liquid diet.  Cleviprex initially ongoing for blood pressure control.  He was cleared to begin subcutaneous heparin for DVT prophylaxis 12/11/2019.  Admit date: 12/15/2019 Discharge date: 01/12/2020  HPI BPs 115 and below Sleeping well Out patientPT, OT Left side strength coming back. Still n Standing well needs help with shoes and socks Showers with wife assist and grab bar No falls  Pain Inventory Average Pain 0 Pain Right Now 0 My pain is no pain  In the last 24  hours, has pain interfered with the following? General activity 0 Relation with others 0 Enjoyment of life 0 What TIME of day is your pain at its worst? no pain Sleep (in general) Good  Pain is worse with: no pain Pain improves with: no pain Relief from Meds: no pain  Mobility walk without assistance use a walker ability to climb steps?  no do you drive?  no use a wheelchair needs help with transfers  Function disabled: date disabled . I need assistance with the following:  dressing, bathing, toileting, meal prep, household duties and shopping  Neuro/Psych trouble walking  Prior Studies Any changes since last visit?  no  Physicians involved in your care Any changes since last visit?  no Primary care kramer saw last week   No family history on file. Social History   Socioeconomic History   Marital status: Married    Spouse name: Not on file   Number of children: Not on file   Years of education: Not on file   Highest education level: Not on file  Occupational History   Not on file  Tobacco Use   Smoking status: Never Smoker   Smokeless tobacco: Never Used  Substance and Sexual Activity   Alcohol use: Yes    Comment: several drinks over the weekend   Drug use: Never   Sexual activity: Not on file  Other Topics Concern   Not on file  Social History Narrative   Not on file   Social Determinants of Health   Financial Resource Strain:    Difficulty of Paying Living Expenses:   Food Insecurity:    Worried About  Running Out of Food in the Last Year:    Arboriculturist in the Last Year:   Transportation Needs:    Film/video editor (Medical):    Lack of Transportation (Non-Medical):   Physical Activity:    Days of Exercise per Week:    Minutes of Exercise per Session:   Stress:    Feeling of Stress :   Social Connections:    Frequency of Communication with Friends and Family:    Frequency of Social Gatherings with Friends and  Family:    Attends Religious Services:    Active Member of Clubs or Organizations:    Attends Archivist Meetings:    Marital Status:    No past surgical history on file. No past medical history on file. BP 114/79    Pulse 79    Temp 98.7 F (37.1 C)    Ht 5\' 7"  (1.702 m)    Wt 139 lb (63 kg)    SpO2 98%    BMI 21.77 kg/m   Opioid Risk Score:   Fall Risk Score:  `1  Depression screen PHQ 2/9  Depression screen PHQ 2/9 01/25/2020  Decreased Interest 0  Down, Depressed, Hopeless 0  PHQ - 2 Score 0  Altered sleeping 0  Tired, decreased energy 0  Change in appetite 0  Feeling bad or failure about yourself  0  Trouble concentrating 0  Moving slowly or fidgety/restless 0  Suicidal thoughts 0  PHQ-9 Score 0    Review of Systems  Constitutional: Negative.   HENT: Negative.   Eyes: Negative.   Respiratory: Negative.   Cardiovascular: Negative.   Gastrointestinal: Negative.   Endocrine: Negative.   Genitourinary: Negative.   Musculoskeletal: Positive for gait problem.  Skin: Negative.   Allergic/Immunologic: Negative.   Hematological: Negative.   Psychiatric/Behavioral: Negative.   All other systems reviewed and are negative.      Objective:   Physical Exam Vitals and nursing note reviewed.  HENT:     Head: Normocephalic and atraumatic.  Eyes:     General: No visual field deficit.    Extraocular Movements: Extraocular movements intact.     Conjunctiva/sclera: Conjunctivae normal.     Pupils: Pupils are equal, round, and reactive to light.  Cardiovascular:     Rate and Rhythm: Normal rate and regular rhythm.     Heart sounds: Normal heart sounds.  Pulmonary:     Effort: Pulmonary effort is normal. No respiratory distress.     Breath sounds: Normal breath sounds. No stridor.  Abdominal:     General: Abdomen is flat. Bowel sounds are normal. There is no distension.     Palpations: Abdomen is soft.  Musculoskeletal:     Cervical back: Normal range  of motion.     Right lower leg: No edema.     Left lower leg: No edema.     Comments: Minimal left hand edema there is full passive range of motion of left upper extremity and left lower extremity there is pain with stretching of the fingers, i.e. extension  Skin:    General: Skin is warm and dry.  Neurological:     Mental Status: He is alert.     Cranial Nerves: No dysarthria or facial asymmetry.     Sensory: Sensory deficit present.     Coordination: Coordination abnormal. Heel to Shin Test abnormal.     Gait: Gait abnormal.     Comments: Motor strength is 5/5 in the right  deltoid, bicep, tricep, grip hip flexor, knee extensors, ankle dorsiflexor 3 - at the left deltoid 3 - at the bicep finger flexors and extensors Sensation is reduced to light touch in the left hand wrist and elbow Intact sensation in the left knee and ankle area. : MAS 2 at the left finger and wrist flexors as well as left thumb flexors MAS 1 at the elbow flexors MAS 1 at the left hamstrings No evidence of clonus at the ankle     Oriented to month but not day and date, oriented to Drs. Office but not straight      Assessment & Plan:   1.  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  In addition the patient has some cognitive deficits with reduced orientation as well as memory he is developing some increased tone in the left upper extremity but at this time this can be managed by stretching.  We discussed continuing outpatient PT OT speech  The patient has been given written material on botulinum toxin and asked to call if he wishes to schedule  Physical medicine rehab follow-up in 6 weeks   2.  Seizure prophylaxis continue Keppra until neurology appointment they can discuss at that time.  Neurology appointment on August 24   3.  Secondary stroke prophylaxis continue hypertensive management current medications follow-up with PCP

## 2020-01-27 ENCOUNTER — Ambulatory Visit: Payer: BC Managed Care – PPO | Admitting: Physical Therapy

## 2020-01-27 ENCOUNTER — Encounter: Payer: Self-pay | Admitting: Physical Therapy

## 2020-01-27 ENCOUNTER — Ambulatory Visit: Payer: BC Managed Care – PPO | Admitting: Occupational Therapy

## 2020-01-27 ENCOUNTER — Ambulatory Visit: Payer: BC Managed Care – PPO | Admitting: Speech Pathology

## 2020-01-27 ENCOUNTER — Other Ambulatory Visit: Payer: Self-pay

## 2020-01-27 DIAGNOSIS — R41841 Cognitive communication deficit: Secondary | ICD-10-CM | POA: Diagnosis not present

## 2020-01-27 DIAGNOSIS — R2689 Other abnormalities of gait and mobility: Secondary | ICD-10-CM | POA: Diagnosis not present

## 2020-01-27 DIAGNOSIS — R41842 Visuospatial deficit: Secondary | ICD-10-CM

## 2020-01-27 DIAGNOSIS — R209 Unspecified disturbances of skin sensation: Secondary | ICD-10-CM | POA: Diagnosis not present

## 2020-01-27 DIAGNOSIS — I69354 Hemiplegia and hemiparesis following cerebral infarction affecting left non-dominant side: Secondary | ICD-10-CM | POA: Diagnosis not present

## 2020-01-27 DIAGNOSIS — R29818 Other symptoms and signs involving the nervous system: Secondary | ICD-10-CM

## 2020-01-27 DIAGNOSIS — R2681 Unsteadiness on feet: Secondary | ICD-10-CM | POA: Diagnosis not present

## 2020-01-27 DIAGNOSIS — M6281 Muscle weakness (generalized): Secondary | ICD-10-CM | POA: Diagnosis not present

## 2020-01-27 DIAGNOSIS — R414 Neurologic neglect syndrome: Secondary | ICD-10-CM | POA: Diagnosis not present

## 2020-01-27 NOTE — Therapy (Signed)
[facility] [address] [place], [place], [address] Phone: [phone]   Fax:  [phone]  Speech Language Pathology Treatment  Patient Details  Name: Dennis Mercado MRN: [id number] Date of Birth: August 22, 1980 Referring Provider (SLP): Mar[place]e [place], [place]   Encounter Date: 01/27/2020   End of Session - 01/27/20 1430    Visit Number 2    Number of Visits 25    Date for SLP Re-Evaluation 04/20/20    Authorization Type 30 visit limit ST, 30 visits combined for PT/OT    Authorization - Visit Number 2    Authorization - Number of Visits 30    SLP Start Time 1020    SLP Stop Time  1100    SLP Time Calculation (min) 40 min    Activity Tolerance Patient tolerated treatment well           No past medical history on file.  No past surgical history on file.  There were no vitals filed for this visit.   Subjective Assessment - 01/27/20 1026    Subjective "Is it tomorrow?" re: birthday    Patient is accompained by: --   wife April   Currently in Pain? No/denies                 ADULT SLP TREATMENT - 01/27/20 1342      General Information   Behavior/Cognition Alert;Cooperative      Treatment Provided   Treatment provided Cognitive-Linquistic      Pain Assessment   Pain Assessment No/denies pain      Cognitive-Linquistic Treatment   Treatment focused on Cognition;Patient/family/caregiver education    Skilled Treatment Patient present for session with wife; completed homework assigned previous session. Brief discussion re: plan of care as pt visit limits 30 for PT/OT. Has elected to continue PT/OT at 1x per week. Would like to continue ST at 2x per week. Completed cognitive-linguistic testing (CLQT) with pt with scores WNL for attention, memory, language and visuospatial skills, and mild impairment for executive function and clock drawing. Functional deficits noted and reported by pt's wife. SLP pointed out  errors which occurred due to impulsivity, left inattention, planning/organization. Pt reported he sometimes leaves the water on when washing his hands, and wife reported pt is impulsive with transfers. Homework was 95% accurate; SLP noted impaired alternating attention when pt attempted to check his answers. Once pointing this out to patient and suggesting a strategy, SLP able to fade cues to supervision for use of strategy to mark his place. Education for pt/spouse re: use of binder for organization and recall. Requested pt bring this to next session.       Assessment / Recommendations / Plan   Plan Continue with current plan of care      Progression Toward Goals   Progression toward goals Progressing toward goals              SLP Short Term Goals - 01/27/20 1432      SLP SHORT TERM GOAL #1   Title pt will initiate use of memory compensations in 3 sessions    Time 4    Period Weeks   or 9 sessions   Status On-going      SLP SHORT TERM GOAL #2   Title pt will use memory compensations successfully between/in 3 sessions    Time 6   or 13 sessions   Period Weeks    Status On-going      SLP SHORT TERM  GOAL #3   Title pt will tell SLP 3 cognitive linguistic deficits in 2 sessions    Time 4   or9 sessions   Period Weeks    Status On-going      SLP SHORT TERM GOAL #4   Title pt will demonstrate error awareness in therapy tasks 100% with spontaneous re-check in 3 sessions    Time 6   or 13 sessions   Period Weeks    Status On-going      SLP SHORT TERM GOAL #5   Title pt will demo selective attention in mod noisy environment for 10 minutes while completing simple-mod complex therapy task/s in 3 sessions    Time 6    Period Weeks   or 13 sessions   Status On-going            SLP Long Term Goals - 01/27/20 1433      SLP LONG TERM GOAL #1   Title pt will demonstrate WFL attention and problem solving skills 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) to prepare  for return to work 100% with self-corrections in 3 sessions    Time 12    Period Weeks   or 25 sessions, for all LTGs   Status On-going      SLP LONG TERM GOAL #2   Title pt will utilize functional  memory compensations successfully between therapy tasks reported by pt and/or wife between 4 sessions    Time 12    Period Weeks    Status On-going      SLP LONG TERM GOAL #3   Title pt will demonstrate divided attention in two simple therapy tasks in 3 sessions    Time 8    Period Weeks    Status On-going      SLP LONG TERM GOAL #4   Title pt will use compensations for attention successfully PRN in 2 sessions    Time 12    Period Weeks    Status On-going            Plan - 01/27/20 1431    Clinical Impression Statement Pt presents today with cognitive linguistic deficits in areas of attention, awareness, memory, executive function, and problem solving. 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 Wife stated pt paid bills yesterday with supervision for accuracy. Pt has some splinter skills with awareness and attention/memory; CLQT scores are WNL for these domains however SLP judges at least mild-moderate deficits, functionally. I recommend skilled ST to target deficit areas mentioned before, in order to return to PLOF.    Speech Therapy Frequency 2x / week    Duration --   12 weeks, or 25 total visits   Treatment/Interventions Internal/external aids;Patient/family education;Compensatory strategies;Cognitive reorganization;Cueing hierarchy;SLP instruction and feedback;Functional tasks;Environmental controls    Potential to Achieve Goals Good    Potential Considerations Cooperation/participation level   awareness          Patient will benefit from skilled therapeutic intervention in order to improve the following deficits and  impairments:   Cognitive communication deficit    Problem List Patient Active Problem List   Diagnosis Date Noted  . History of CVA (cerebrovascular accident) 01/19/2020  . Normocytic anemia   . Benign essential HTN   . Hyponatremia   . Acute blood loss anemia   . Hypotension  due to drugs   . Vascular headache   . Prerenal azotemia   . Sleep disturbance   . Leukocytosis   . Transaminitis   . Marijuana abuse   . Essential hypertension   . Dysphagia, post-stroke   . Left hemiparesis ([facility])   . Acute respiratory failure ([facility])   . ICH (intracerebral hemorrhage) ([facility]) 12/06/2019   Deneise Lever, [place], [place] 01/27/2020, 2:33 PM  [facility] [address] [place], [place], [address] Phone: [phone]   Fax:  [phone]   Name: Dennis Mercado MRN: [id number] Date of Birth: 08/15/1980

## 2020-01-27 NOTE — Therapy (Signed)
[facility] [address] [place], [place], [address] Phone: [phone]   Fax:  [phone]  Occupational Therapy Treatment  Patient Details  Name: Dennis Mercado MRN: [id number] Date of Birth: January 08, 1981 Referring Provider (OT): Dr. Letta Pate   Encounter Date: 01/27/2020   OT End of Session - 01/27/20 1257    Visit Number 3    Number of Visits 25   POC written for 12 weeks, 2x week will need to adjust freq due to insurance   Date for OT Re-Evaluation 04/20/20    Authorization Type [employer]    Authorization Time Period 90 days- 30 VL combined for OT/ PT, will adjust frequency accordingly    Authorization - Visit Number 3    Authorization - Number of Visits 15    OT Start Time 1100    OT Stop Time 1145    OT Time Calculation (min) 45 min    Activity Tolerance Patient tolerated treatment well    Behavior During Therapy [facility] for tasks assessed/performed           No past medical history on file.  No past surgical history on file.  There were no vitals filed for this visit.   Subjective Assessment - 01/27/20 1105    Subjective  "my wrist was hurting this morning"    Pertinent History Pt. presented to ED on 12/06/2019 with left side weakness and  altered mental status s/p coitus.  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 Pt required intubation 6/22 due to ARF secondary to aspiration PNA. Received inpatient rehab. Pt was discharged 7/28/2    Limitations HTN, left inattention    Patient Stated Goals to be able to use my hand    Currently in Pain? No/denies           Reviewed w/ pt/wife frequency and duration d/t limited visits. Discussed best way to optimize visits. [name] to look at schedule and bring back next time.  Reviewed proper bed positioning with pt/wife.  Reviewed and modified HEP issued at hospital including  stretches in supine and for wrist and hand (also briefly reviewed what previous therapist did last session)_  Wt bearing over LUE with cross reaching RUE followed by AA/ROM LUE with mod assist/facilitation.  Transfers w/c to/from mat with min assist.                       OT Short Term Goals - 01/21/20 1541      OT SHORT TERM GOAL #1   Title I with inital HEP    Time 6    Period Weeks    Status New      OT SHORT TERM GOAL #2   Title Pt will donn shirt with supervision and min v.c, and pants with min A    Time 6    Period Weeks    Status New      OT SHORT TERM GOAL #3   Title Pt will attend to items in L hemibody space with no more than min v.c    Time 6    Period Weeks    Status New      OT SHORT TERM GOAL #4   Title Pt will use LUE as stabilizer 10%of the time with min v.c    Time 6    Period Weeks    Status New      OT  SHORT TERM GOAL #5   Title Pt will transfer to toilet with close supervison    Time 6    Period Weeks    Status New             OT Long Term Goals - 01/21/20 1544      OT LONG TERM GOAL #1   Title I with updated HEP    Time 12    Period Weeks    Status New      OT LONG TERM GOAL #2   Title Pt will donn shirt mod I and perfrom LB dressing with min A    Time 12    Period Weeks    Status New      OT LONG TERM GOAL #3   Title Pt. will bathe with min A    Time 12    Period Weeks    Status New      OT LONG TERM GOAL #4   Title Pt will demonstrate 40* A/ROM shoulder flexion in prep for reach    Time 12    Period Weeks    Status New      OT LONG TERM GOAL #5   Title Pt will perform tabletop scanning activities with 75%better accuracy    Time 12    Period Weeks    Status New      OT LONG TERM GOAL #6   Title Pt will demonstrate 50% elbow flexion/ extension in LUE  for functional use.    Time 12    Period Weeks    Status New                 Plan - 01/27/20 1257    Clinical Impression Statement Pt/wife  verbalize understanding of education provided. Pt appears very motived for improved LUE movement and use.    OT Occupational Profile and History Detailed Assessment- Review of Records and additional review of physical, cognitive, psychosocial history related to current functional performance    Occupational performance deficits (Please refer to evaluation for details): ADL's;IADL's;Leisure;Social Participation;Work;Play    Body Structure / Function / Physical Skills ADL;Balance;Endurance;Mobility;Strength;UE functional use;FMC;Vision;Coordination;Gait;Decreased knowledge of precautions;GMC;ROM;Decreased knowledge of use of DME;Dexterity;IADL;Sensation    Cognitive Skills Attention;Memory;Problem Solve;Safety Awareness;Thought;Understand    Rehab Potential Fair    Clinical Decision Making Limited treatment options, no task modification necessary    Comorbidities Affecting Occupational Performance: May have comorbidities impacting occupational performance    Modification or Assistance to Complete Evaluation  No modification of tasks or assist necessary to complete eval    OT Frequency 2x / week   plus eval   OT Duration 12 weeks    OT Treatment/Interventions Self-care/ADL training;Therapeutic exercise;Functional Mobility Training;Balance training;Splinting;Manual Therapy;Neuromuscular education;Aquatic Therapy;Ultrasound;Energy conservation;Therapeutic activities;Cryotherapy;Paraffin;DME and/or AE instruction;Cognitive remediation/compensation;Visual/perceptual remediation/compensation;Gait Training;Fluidtherapy;Electrical Stimulation;Moist Heat;Contrast Bath;Passive range of motion;Patient/family education    Plan neuro re-ed of LUE; hemi-techniques for ADLs    Consulted and Agree with Plan of Care Patient;Family member/caregiver           Patient will benefit from skilled therapeutic intervention in order to improve the following deficits and impairments:   Body Structure / Function / Physical  Skills: ADL, Balance, Endurance, Mobility, Strength, UE functional use, FMC, Vision, Coordination, Gait, Decreased knowledge of precautions, GMC, ROM, Decreased knowledge of use of DME, Dexterity, IADL, Sensation Cognitive Skills: Attention, Memory, Problem Solve, Safety Awareness, Thought, Understand     Visit Diagnosis: Hemiplegia and hemiparesis following cerebral infarction affecting left non-dominant side (HCC)  Other symptoms  and signs involving the nervous system  Visuospatial deficit  Neurologic neglect syndrome    Problem List Patient Active Problem List   Diagnosis Date Noted  . History of CVA (cerebrovascular accident) 01/19/2020  . Normocytic anemia   . Benign essential HTN   . Hyponatremia   . Acute blood loss anemia   . Hypotension due to drugs   . Vascular headache   . Prerenal azotemia   . Sleep disturbance   . Leukocytosis   . Transaminitis   . Marijuana abuse   . Essential hypertension   . Dysphagia, post-stroke   . Left hemiparesis ([place])   . Acute respiratory failure ([place])   . ICH (intracerebral hemorrhage) ([place]) 12/06/2019    Carey Bullocks, OTR/L 01/27/2020, 12:58 PM  [facility] [address] [place], [place], [address] Phone: [phone]   Fax:  [phone]  Name: Dennis Mercado MRN: [id number] Date of Birth: 1980/09/26

## 2020-01-27 NOTE — Patient Instructions (Signed)
Get a binder and make sections for PT, OT and Speech. Put a calendar in there, too (you can ask the front desk for a printout when you check out)  Complete assigned homework and bring your binder when you see Dennis Mercado next week

## 2020-01-28 ENCOUNTER — Encounter: Payer: BC Managed Care – PPO | Admitting: Occupational Therapy

## 2020-01-28 ENCOUNTER — Ambulatory Visit: Payer: BC Managed Care – PPO | Admitting: Physical Therapy

## 2020-01-28 NOTE — Therapy (Signed)
[facility] [address] [place], [place], [address] Phone: [phone]   Fax:  [phone]  Physical Therapy Treatment  Patient Details  Name: Dennis Mercado MRN: [id number] Date of Birth: [birth year] Referring Provider (PT): Angiulli, Chapman Fitch (will be followed by Dr. Letta Pate)   Encounter Date: 01/27/2020   PT End of Session - 01/27/20 0935    Visit Number 3    Number of Visits 15    Date for PT Re-Evaluation 03/18/20    Authorization Type BCBS, VL for PT and OT = 30 (zero used, if seen on same day, will count as 2 visits), VL for ST = 30    Authorization - Visit Number 3    Authorization - Number of Visits 15    PT Start Time 0933    PT Stop Time 1015    PT Time Calculation (min) 42 min    Equipment Utilized During Treatment Gait belt    Activity Tolerance Patient tolerated treatment well    Behavior During Therapy [facility] for tasks assessed/performed           History reviewed. No pertinent past medical history.  History reviewed. No pertinent surgical history.  There were no vitals filed for this visit.   Subjective Assessment - 01/27/20 0934    Subjective No new complaints. No falls or pain to report. Has been walking short distance with spouse at home.    Pertinent History HTN    Currently in Pain? No/denies    Pain Score 0-No pain                [facility] Adult PT Treatment/Exercise - 01/27/20 0936      Transfers   Transfers Sit to Stand;Stand to Sit;Stand Pivot Transfers    Sit to Stand 4: Min guard;With upper extremity assist;From bed;From chair/3-in-1    Sit to Stand Details Verbal cues for sequencing;Verbal cues for precautions/safety;Manual facilitation for weight shifting;Manual facilitation for placement;Verbal cues for safe use of DME/AE    Sit to Stand Details (indicate cue type and reason) cues to scoot to edge of surface, hand placement and weight shifting    Stand to Sit 4: Min  guard;With upper extremity assist;To bed;To chair/3-in-1    Stand to Sit Details (indicate cue type and reason) Verbal cues for sequencing;Verbal cues for precautions/safety;Verbal cues for safe use of DME/AE;Manual facilitation for weight shifting;Manual facilitation for placement    Stand to Sit Details cues to reach back and use the arm to controll descent with sitting down    Stand Pivot Transfers 4: Min assist    Stand Pivot Transfer Details (indicate cue type and reason) no device with AFO on left LE. cues on posture, sequencing and technique.       Ambulation/Gait   Ambulation/Gait Yes    Ambulation/Gait Assistance 4: Min guard;4: Min assist    Ambulation/Gait Assistance Details cues on posture, step length, step placement and weight shifting onto left LE.     Ambulation Distance (Feet) 115 Feet   x1   Assistive device Rolling walker;Other (Comment)   left AFO, left hand orthotic   Gait Pattern Step-to pattern;Decreased step length - right;Decreased stance time - left;Decreased hip/knee flexion - left;Decreased weight shift to left;Left flexed knee in stance;Narrow base of support;Poor foot clearance - left    Ambulation Surface Level;Indoor      Neuro Re-ed    Neuro Re-ed Details  for balance/muscle re-ed:  seated at edge of mat- with right  foot on 4 inch box- sit<>stands for 10 reps with cues/faciliation for left LE weight shifting/engagement, tall standing and controlled sitting; then with right foot on foam bubble for sit<>stands for 10 reps with cues/facilitation needed as stated before; in standing with right UE support on sturdy surface: right LE stance with left foot taps to/from 4 inch box for 10 reps with emphasis on hip/knee flexion, decreased circumduction. Assist needed for LE movements and balance (min). Then with right foot forward on 4 inch box- had pt work on left LE minisquats for 10 reps with guarding at knee, min assist for balance.                PT Short Term  Goals - 01/18/20 1191      PT SHORT TERM GOAL #1   Title Pt and pt's spouse will be independent with initial HEP in order to build upon functional gains made in therapy. ALL STGS DUE 02/15/20    Time 4    Period Weeks    Status New    Target Date 02/15/20      PT SHORT TERM GOAL #2   Title Pt will perform squat pivot vs. stand pivot with supervision in order to improve functional mobility and decr caregiver burden.    Time 4    Period Weeks    Status New      PT SHORT TERM GOAL #3   Title Pt will perform sit <> stands with RW with supervision and pt able to place LUE in L hand orthosis with supervision in order to improve functional mobility.    Time 4    Period Weeks    Status New      PT SHORT TERM GOAL #4   Title Pt will ambulate at least 100' with RW and min guard in order to improve household mobility.    Time 4    Period Weeks    Status New      PT SHORT TERM GOAL #5   Title Pt will perform standing balance with single UE support at sink for 5 minutes with supervision in order to incr tolerance for ADLs.    Time 4    Period Weeks    Status New             PT Long Term Goals - 01/18/20 0941      PT LONG TERM GOAL #1   Title Pt and pt's spouse will be independent with final HEP in order to build upon functional gains made in therapy. ALL LTGS DUE 03/14/20    Time 8    Period Weeks    Status New    Target Date 03/14/20      PT LONG TERM GOAL #2   Title Pt will ambulate at least 230' with RW vs. LRAD and supervision in order to improve functional mobility.    Time 8    Period Weeks    Status New      PT LONG TERM GOAL #3   Title Pt will perform all bed mobility with mod I    Baseline 8    Period Weeks    Status New      PT LONG TERM GOAL #4   Title Pt will perform 2 steps with single HHA vs. LRAD with min guard in order to safely enter home.    Time 8    Period Weeks    Status New      PT LONG TERM  GOAL #5   Title Pt will perform all transfers with  mod I in order to decr caregiver burden and improve functional mobility.    Time 8    Period Weeks    Status New                 Plan - 01/27/20 0936    Clinical Impression Statement Today's skilled session initially focused on gait training with RW/AFO/hand orthotic with min guard to min assist needed for balance. Pt to begin to ambulate at home with spouse/family assistance. Remainder of session focused on left LE strengthening/NMR with rest breaks taken as needed. The pt is progressing toward goals and should benefit from continued PT to progress toward unmet goals.    Personal Factors and Comorbidities Comorbidity 1;Past/Current Experience;Age;Profession    Comorbidities HTN    Examination-Activity Limitations Bathing;Bed Mobility;Carry;Hygiene/Grooming;Stand;Stairs;Squat;Transfers;Locomotion Level    Examination-Participation Restrictions Community Activity;Occupation   walking his dog   Stability/Clinical Decision Making Evolving/Moderate complexity    Rehab Potential Good    PT Frequency 2x / week    PT Duration --   7 weeks   PT Treatment/Interventions ADLs/Self Care Home Management;Aquatic Therapy;Electrical Stimulation;DME Instruction;Gait training;Stair training;Functional mobility training;Therapeutic activities;Therapeutic exercise;Balance training;Neuromuscular re-education;Wheelchair mobility training;Orthotic Fit/Training;Patient/family education;Passive range of motion;Energy conservation;Vestibular    PT Next Visit Plan gait training with use of RW and L hand orthosisNMR for weightshifting through LLE, transfer training, standing at countertop for weight bearing with use of givmohr sling?    PT Home Exercise Plan [id number]    Consulted and Agree with Plan of Care Patient;Family member/caregiver    Family Member Consulted wife, April           Patient will benefit from skilled therapeutic intervention in order to improve the following deficits and impairments:   Abnormal gait, Decreased activity tolerance, Decreased balance, Decreased mobility, Decreased knowledge of use of DME, Decreased coordination, Decreased range of motion, Decreased strength, Difficulty walking, Impaired tone, Impaired sensation, Postural dysfunction  Visit Diagnosis: Hemiplegia and hemiparesis following cerebral infarction affecting left non-dominant side (HCC)  Other symptoms and signs involving the nervous system  Other abnormalities of gait and mobility  Muscle weakness (generalized)     Problem List Patient Active Problem List   Diagnosis Date Noted  . History of CVA (cerebrovascular accident) 01/19/2020  . Normocytic anemia   . Benign essential HTN   . Hyponatremia   . Acute blood loss anemia   . Hypotension due to drugs   . Vascular headache   . Prerenal azotemia   . Sleep disturbance   . Leukocytosis   . Transaminitis   . Marijuana abuse   . Essential hypertension   . Dysphagia, post-stroke   . Left hemiparesis ([place])   . Acute respiratory failure ([place])   . ICH (intracerebral hemorrhage) ([place]) 12/06/2019    Willow Ora, PTA, [facility] [address], [place], [place] [address] [phone] 01/28/20, 12:25 PM   Name: Dennis Mercado MRN: [id number] Date of Birth: 1980-07-07

## 2020-01-31 ENCOUNTER — Encounter: Payer: Self-pay | Admitting: Occupational Therapy

## 2020-01-31 ENCOUNTER — Ambulatory Visit: Payer: BC Managed Care – PPO | Admitting: Occupational Therapy

## 2020-01-31 ENCOUNTER — Ambulatory Visit: Payer: BC Managed Care – PPO | Admitting: Physical Therapy

## 2020-01-31 ENCOUNTER — Other Ambulatory Visit: Payer: Self-pay

## 2020-01-31 DIAGNOSIS — R414 Neurologic neglect syndrome: Secondary | ICD-10-CM | POA: Diagnosis not present

## 2020-01-31 DIAGNOSIS — R29818 Other symptoms and signs involving the nervous system: Secondary | ICD-10-CM | POA: Diagnosis not present

## 2020-01-31 DIAGNOSIS — M6281 Muscle weakness (generalized): Secondary | ICD-10-CM | POA: Diagnosis not present

## 2020-01-31 DIAGNOSIS — R41841 Cognitive communication deficit: Secondary | ICD-10-CM | POA: Diagnosis not present

## 2020-01-31 DIAGNOSIS — I69354 Hemiplegia and hemiparesis following cerebral infarction affecting left non-dominant side: Secondary | ICD-10-CM | POA: Diagnosis not present

## 2020-01-31 DIAGNOSIS — R41842 Visuospatial deficit: Secondary | ICD-10-CM | POA: Diagnosis not present

## 2020-01-31 DIAGNOSIS — R2689 Other abnormalities of gait and mobility: Secondary | ICD-10-CM | POA: Diagnosis not present

## 2020-01-31 DIAGNOSIS — R209 Unspecified disturbances of skin sensation: Secondary | ICD-10-CM | POA: Diagnosis not present

## 2020-01-31 DIAGNOSIS — R2681 Unsteadiness on feet: Secondary | ICD-10-CM | POA: Diagnosis not present

## 2020-01-31 NOTE — Therapy (Signed)
[facility] [address] [place], [place], [address] Phone: [phone]   Fax:  [phone]  Occupational Therapy Treatment  Patient Details  Name: Dennis Mercado MRN: [id number] Date of Birth: [birth year] Referring Provider (OT): Dr. Letta Pate   Encounter Date: 01/31/2020   OT End of Session - 01/31/20 1624    Visit Number 4    Number of Visits 25   POC written for 12 weeks, 2x week will need to adjust freq due to insurance   Date for OT Re-Evaluation 04/20/20    Authorization Type BCBS    Authorization Time Period 90 days- 30 VL combined for OT/ PT, will adjust frequency accordingly    Authorization - Visit Number 4    Authorization - Number of Visits 15    OT Start Time [phone]    OT Stop Time 0800    OT Time Calculation (min) 39 min    Activity Tolerance Patient tolerated treatment well    Behavior During Therapy [facility] for tasks assessed/performed           History reviewed. No pertinent past medical history.  History reviewed. No pertinent surgical history.  There were no vitals filed for this visit.   Subjective Assessment - 01/31/20 1623    Subjective  Denies pain    Pertinent History Pt. presented to ED on 12/06/2019 with left side weakness and  altered mental status s/p coitus.  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 Pt required intubation 6/22 due to ARF secondary to aspiration PNA. Received inpatient rehab. Pt was discharged 7/28/2    Limitations HTN, left inattention    Patient Stated Goals to be able to use my hand    Currently in Pain? No/denies                    Treatment: Self ROM reach for the floor shoulder flexion x 10 reps min v.c Attempted weightbearing. though hands at tabletop, however due to wrist tightness pt was unable to perform despite assistance. Supine self ROM shoulder flexion x 10  reps sidelying AA/ROM elbow flexion extension and supination/ pronation with pt watching his LUE, min v.c Seated low range shoulder flexion with supported stool and UE ranger, mod / max facilitation. Pt transferred with min a to and from mat, stand pivot.              OT Short Term Goals - 01/21/20 1541      OT SHORT TERM GOAL #1   Title I with inital HEP    Time 6    Period Weeks    Status New      OT SHORT TERM GOAL #2   Title Pt will donn shirt with supervision and min v.c, and pants with min A    Time 6    Period Weeks    Status New      OT SHORT TERM GOAL #3   Title Pt will attend to items in L hemibody space with no more than min v.c    Time 6    Period Weeks    Status New      OT SHORT TERM GOAL #4   Title Pt will use LUE as stabilizer 10%of the time with min v.c    Time 6    Period Weeks    Status New      OT SHORT TERM GOAL #5  Title Pt will transfer to toilet with close supervison    Time 6    Period Weeks    Status New             OT Long Term Goals - 01/21/20 1544      OT LONG TERM GOAL #1   Title I with updated HEP    Time 12    Period Weeks    Status New      OT LONG TERM GOAL #2   Title Pt will donn shirt mod I and perfrom LB dressing with min A    Time 12    Period Weeks    Status New      OT LONG TERM GOAL #3   Title Pt. will bathe with min A    Time 12    Period Weeks    Status New      OT LONG TERM GOAL #4   Title Pt will demonstrate 40* A/ROM shoulder flexion in prep for reach    Time 12    Period Weeks    Status New      OT LONG TERM GOAL #5   Title Pt will perform tabletop scanning activities with 75%better accuracy    Time 12    Period Weeks    Status New      OT LONG TERM GOAL #6   Title Pt will demonstrate 50% elbow flexion/ extension in LUE  for functional use.    Time 12    Period Weeks    Status New                 Plan - 01/31/20 1625    Clinical Impression Statement Pt is progressing  towards goals. He understanding that he is able to move LUE more and better if he watches his LUE.    OT Occupational Profile and History Detailed Assessment- Review of Records and additional review of physical, cognitive, psychosocial history related to current functional performance    Occupational performance deficits (Please refer to evaluation for details): ADL's;IADL's;Leisure;Social Participation;Work;Play    Body Structure / Function / Physical Skills ADL;Balance;Endurance;Mobility;Strength;UE functional use;FMC;Vision;Coordination;Gait;Decreased knowledge of precautions;GMC;ROM;Decreased knowledge of use of DME;Dexterity;IADL;Sensation    Cognitive Skills Attention;Memory;Problem Solve;Safety Awareness;Thought;Understand    Rehab Potential Fair    Clinical Decision Making Limited treatment options, no task modification necessary    Comorbidities Affecting Occupational Performance: May have comorbidities impacting occupational performance    Modification or Assistance to Complete Evaluation  No modification of tasks or assist necessary to complete eval    OT Frequency 2x / week   plus eval   OT Duration 12 weeks    OT Treatment/Interventions Self-care/ADL training;Therapeutic exercise;Functional Mobility Training;Balance training;Splinting;Manual Therapy;Neuromuscular education;Aquatic Therapy;Ultrasound;Energy conservation;Therapeutic activities;Cryotherapy;Paraffin;DME and/or AE instruction;Cognitive remediation/compensation;Visual/perceptual remediation/compensation;Gait Training;Fluidtherapy;Electrical Stimulation;Moist Heat;Contrast Bath;Passive range of motion;Patient/family education    Plan neuro re-ed of LUE; hemi-techniques for ADLs    Consulted and Agree with Plan of Care Patient;Family member/caregiver           Patient will benefit from skilled therapeutic intervention in order to improve the following deficits and impairments:   Body Structure / Function / Physical Skills:  ADL, Balance, Endurance, Mobility, Strength, UE functional use, FMC, Vision, Coordination, Gait, Decreased knowledge of precautions, GMC, ROM, Decreased knowledge of use of DME, Dexterity, IADL, Sensation Cognitive Skills: Attention, Memory, Problem Solve, Safety Awareness, Thought, Understand     Visit Diagnosis: Hemiplegia and hemiparesis following cerebral infarction affecting left non-dominant side (HCC)  Other symptoms  and signs involving the nervous system  Visuospatial deficit  Neurologic neglect syndrome    Problem List Patient Active Problem List   Diagnosis Date Noted  . History of CVA (cerebrovascular accident) 01/19/2020  . Normocytic anemia   . Benign essential HTN   . Hyponatremia   . Acute blood loss anemia   . Hypotension due to drugs   . Vascular headache   . Prerenal azotemia   . Sleep disturbance   . Leukocytosis   . Transaminitis   . Marijuana abuse   . Essential hypertension   . Dysphagia, post-stroke   . Left hemiparesis ([place])   . Acute respiratory failure ([place])   . ICH (intracerebral hemorrhage) ([place]) 12/06/2019    Sampson Self 01/31/2020, 4:26 PM  [facility] [address] [place], [place], [address] Phone: [phone]   Fax:  [phone]  Name: Jerrad Mendibles MRN: [id number] Date of Birth: March 11, 1981

## 2020-02-01 ENCOUNTER — Ambulatory Visit: Payer: BC Managed Care – PPO

## 2020-02-01 DIAGNOSIS — R29818 Other symptoms and signs involving the nervous system: Secondary | ICD-10-CM | POA: Diagnosis not present

## 2020-02-01 DIAGNOSIS — R41841 Cognitive communication deficit: Secondary | ICD-10-CM

## 2020-02-01 DIAGNOSIS — R2689 Other abnormalities of gait and mobility: Secondary | ICD-10-CM | POA: Diagnosis not present

## 2020-02-01 DIAGNOSIS — I69354 Hemiplegia and hemiparesis following cerebral infarction affecting left non-dominant side: Secondary | ICD-10-CM | POA: Diagnosis not present

## 2020-02-01 DIAGNOSIS — M6281 Muscle weakness (generalized): Secondary | ICD-10-CM | POA: Diagnosis not present

## 2020-02-01 DIAGNOSIS — R209 Unspecified disturbances of skin sensation: Secondary | ICD-10-CM | POA: Diagnosis not present

## 2020-02-01 DIAGNOSIS — R414 Neurologic neglect syndrome: Secondary | ICD-10-CM | POA: Diagnosis not present

## 2020-02-01 DIAGNOSIS — R41842 Visuospatial deficit: Secondary | ICD-10-CM | POA: Diagnosis not present

## 2020-02-01 DIAGNOSIS — R2681 Unsteadiness on feet: Secondary | ICD-10-CM | POA: Diagnosis not present

## 2020-02-01 NOTE — Patient Instructions (Signed)
  Please complete the assigned speech therapy homework prior to your next session and return it to the speech therapist at your next visit.  

## 2020-02-01 NOTE — Therapy (Signed)
[facility] [address] [place], [place], [address] Phone: [phone]   Fax:  [phone]  Speech Language Pathology Treatment  Patient Details  Name: Dennis Mercado MRN: [id number] Date of Birth: December 03, 1980 Referring Provider (SLP): [place], [place]   Encounter Date: 02/01/2020   End of Session - 02/01/20 1230    Visit Number 3    Number of Visits 25    Date for SLP Re-Evaluation 04/20/20    Authorization Type 30 visit limit ST, 30 visits combined for PT/OT    Authorization - Visit Number 3    Authorization - Number of Visits 30    SLP Start Time 1022    SLP Stop Time  1104    SLP Time Calculation (min) 42 min    Activity Tolerance Patient tolerated treatment well           No past medical history on file.  No past surgical history on file.  There were no vitals filed for this visit.          ADULT SLP TREATMENT - 02/01/20 1032      General Information   Behavior/Cognition Alert;Cooperative;Pleasant mood      Treatment Provided   Treatment provided Cognitive-Linquistic      Cognitive-Linquistic Treatment   Treatment focused on Cognition;Patient/family/caregiver education    Skilled Treatment SLP had pt read off answers to figuring elapsed time; two pt had questions on we figured together. Pt got one of two of these wrong without awareness. 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 SLP asked pt about next visit and stated his calendar was at home and denied a therapy schedule was in "(April's) notebook - it's not mine". Wife pointed to pt's notebook and nodded her head. Pt, with cues from SLP to open notebook, req'd cues to look for his calendar and then req'd mod extra time for finding  his next appointment. He was given homework for next session.       Assessment / Recommendations / Plan   Plan Continue with current plan of care      Progression Toward Goals   Progression toward goals Progressing toward goals              SLP Short Term Goals - 02/01/20 1237      SLP SHORT TERM GOAL #1   Title pt will initiate use of memory compensations in 3 sessions    Time 3    Period Weeks   or 9 sessions   Status On-going      SLP SHORT TERM GOAL #2   Title pt will use memory compensations successfully between/in 3 sessions    Time 5   or 13 sessions   Period Weeks    Status On-going      SLP SHORT TERM GOAL #3   Title pt will tell SLP 3 cognitive linguistic deficits in 2 sessions    Time 3   or9 sessions   Period Weeks    Status On-going      SLP SHORT TERM GOAL #4   Title pt will demonstrate error awareness in therapy tasks 100% with spontaneous re-check in 3 sessions    Time 5   or 13 sessions   Period Weeks  Status On-going      SLP SHORT TERM GOAL #5   Title pt will demo selective attention in mod noisy environment for 10 minutes while completing simple-mod complex therapy task/s in 3 sessions    Time 5    Period Weeks   or 13 sessions   Status On-going            SLP Long Term Goals - 02/01/20 1238      SLP LONG TERM GOAL #1   Title pt will demonstrate WFL attention and problem solving skills 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) to prepare for return to work 100% with self-corrections in 3 sessions    Time 11    Period Weeks   or 25 sessions, for all LTGs   Status On-going      SLP LONG TERM GOAL #2   Title pt will utilize functional  memory compensations successfully between therapy tasks reported by pt and/or wife between 4 sessions    Time 11    Period Weeks    Status On-going      SLP LONG TERM GOAL #3   Title pt will demonstrate divided attention in two simple therapy tasks in 3 sessions    Time 7    Period  Weeks    Status On-going      SLP LONG TERM GOAL #4   Title pt will use compensations for attention successfully PRN in 2 sessions    Time 11    Period Weeks    Status On-going            Plan - 02/01/20 1231    Clinical Impression Statement Pt presents today with persistent cognitive linguistic deficits in areas of attention, awareness, memory, executive function, and problem solving. See "skilled intervention" for more details for today's session. I recommend skilled ST to target deficit areas mentioned before, in order to return to PLOF.    Speech Therapy Frequency 2x / week    Duration --   12 weeks, or 25 total visits   Treatment/Interventions Internal/external aids;Patient/family education;Compensatory strategies;Cognitive reorganization;Cueing hierarchy;SLP instruction and feedback;Functional tasks;Environmental controls    Potential to Achieve Goals Good    Potential Considerations Cooperation/participation level   awareness          Patient will benefit from skilled therapeutic intervention in order to improve the following deficits and impairments:   Cognitive communication deficit    Problem List Patient Active Problem List   Diagnosis Date Noted  . History of CVA (cerebrovascular accident) 01/19/2020  . Normocytic anemia   . Benign essential HTN   . Hyponatremia   . Acute blood loss anemia   . Hypotension due to drugs   . Vascular headache   . Prerenal azotemia   . Sleep disturbance   . Leukocytosis   . Transaminitis   . Marijuana abuse   . Essential hypertension   . Dysphagia, post-stroke   . Left hemiparesis ([place])   . Acute respiratory failure ([place])   . ICH (intracerebral hemorrhage) ([place]) 12/06/2019    [facility] ,[place], [place]  02/01/2020, 12:39 PM  [facility] [address] [place], [place], [address] Phone: [phone]   Fax:  [phone]   Name: Rane Blitch MRN:  [id number] Date of Birth: [birth year]

## 2020-02-03 ENCOUNTER — Ambulatory Visit: Payer: BC Managed Care – PPO | Admitting: Physical Therapy

## 2020-02-03 ENCOUNTER — Ambulatory Visit: Payer: BC Managed Care – PPO | Admitting: Speech Pathology

## 2020-02-03 ENCOUNTER — Other Ambulatory Visit: Payer: Self-pay

## 2020-02-03 ENCOUNTER — Encounter: Payer: BC Managed Care – PPO | Admitting: Occupational Therapy

## 2020-02-03 DIAGNOSIS — I69354 Hemiplegia and hemiparesis following cerebral infarction affecting left non-dominant side: Secondary | ICD-10-CM

## 2020-02-03 DIAGNOSIS — R209 Unspecified disturbances of skin sensation: Secondary | ICD-10-CM | POA: Diagnosis not present

## 2020-02-03 DIAGNOSIS — R2681 Unsteadiness on feet: Secondary | ICD-10-CM | POA: Diagnosis not present

## 2020-02-03 DIAGNOSIS — M6281 Muscle weakness (generalized): Secondary | ICD-10-CM

## 2020-02-03 DIAGNOSIS — R41842 Visuospatial deficit: Secondary | ICD-10-CM | POA: Diagnosis not present

## 2020-02-03 DIAGNOSIS — R2689 Other abnormalities of gait and mobility: Secondary | ICD-10-CM

## 2020-02-03 DIAGNOSIS — R29818 Other symptoms and signs involving the nervous system: Secondary | ICD-10-CM

## 2020-02-03 DIAGNOSIS — R41841 Cognitive communication deficit: Secondary | ICD-10-CM

## 2020-02-03 DIAGNOSIS — R414 Neurologic neglect syndrome: Secondary | ICD-10-CM | POA: Diagnosis not present

## 2020-02-03 NOTE — Therapy (Signed)
[facility] [address] [place], [place], [address] Phone: [phone]   Fax:  [phone]  Physical Therapy Treatment  Patient Details  Name: Dennis Mercado MRN: [id number] Date of Birth: [birth year] Referring Provider (PT): Angiulli, Chapman Fitch (will be followed by Dr. Letta Pate)   Encounter Date: 02/03/2020   PT End of Session - 02/03/20 1221    Visit Number 4    Number of Visits 15    Date for PT Re-Evaluation 03/18/20    Authorization Type [employer], VL for PT and OT = 30 (zero used, if seen on same day, will count as 2 visits), VL for ST = 30    Authorization - Visit Number 4    Authorization - Number of Visits 15    PT Start Time 1100    PT Stop Time 1150    PT Time Calculation (min) 50 min    Equipment Utilized During Treatment Gait belt    Activity Tolerance Patient tolerated treatment well    Behavior During Therapy [facility] for tasks assessed/performed           No past medical history on file.  No past surgical history on file.  There were no vitals filed for this visit.   Subjective Assessment - 02/03/20 1103    Subjective Has been walking at home with the walker - about 20' or 30'.    Pertinent History HTN    Patient Stated Goals wants to start walking as soon as possible - "less walker and downgrade to a cane and then nothing at all"    Currently in Pain? No/denies                             [facility] Adult PT Treatment/Exercise - 02/03/20 1229      Transfers   Transfers Sit to Stand;Stand to [employer] Transfers    Sit to Stand 4: Min guard;With upper extremity assist;From bed;From chair/3-in-1    Sit to Stand Details Verbal cues for sequencing;Verbal cues for precautions/safety;Manual facilitation for weight shifting;Manual facilitation for placement;Verbal cues for safe use of DME/AE    Sit to Stand Details (indicate cue type and reason) cues for proper foot placement for  improved weight bearing and BOS when standing    Stand to Sit 4: Min guard;With upper extremity assist;To bed;To chair/3-in-1    Stand to Sit Details (indicate cue type and reason) Verbal cues for sequencing;Verbal cues for precautions/safety;Verbal cues for safe use of DME/AE;Manual facilitation for weight shifting;Manual facilitation for placement    Stand to Sit Details cues to remove L hand from orthosis prior to sitting, pt improving with incr reps throughout session    Stand Pivot Transfers 4: Min assist    Stand Pivot Transfer Details (indicate cue type and reason) no AD with L AFO, cues for sequencing and proper foot placement before performing transfer      Ambulation/Gait   Ambulation/Gait Yes    Ambulation/Gait Assistance 4: Min guard;4: Min assist    Ambulation/Gait Assistance Details min A for balance and occassional steering of RW and making sure LUE is properly placed in L hand orthosis, verbal and tactile cues for L quad activation in stance phase for improved step length of RLE, verbal cues for wider BOS    Ambulation Distance (Feet) 115 Feet   x1, 80' x 1   Assistive device Rolling walker;Other (Comment)   L hand orthosis, L AFO  Gait Pattern Step-to pattern;Decreased step length - right;Decreased stance time - left;Decreased hip/knee flexion - left;Decreased weight shift to left;Left flexed knee in stance;Narrow base of support;Poor foot clearance - left;Step-through pattern    Ambulation Surface Level;Indoor      Therapeutic Activites    Therapeutic Activities Other Therapeutic Activities    Other Therapeutic Activities pt reporting he has not been wearing his AFO at all times when walking/standing, discussed with pt and spouse purpose of AFO and importance of wearing at all times for any standing/weight bearing activity due to decr strength, proprioception/sensation and tone      Neuro Re-ed    Neuro Re-ed Details  With assist of PT tech donned pt's L givmohr sling for L  shoulder support with standing activity: with chair anteriorly, performed x10 reps sit <> stands with RLE on 4" step for incr weight shift/bearing towards LLE - with use of single UE to push up to stand, cues for weight shift and L quad activation and then sitting back down with no UE support, needing min A/min guard for balance. With RUE on chair anteriorly, 2" block under RLE performed buttocks lifts from mat table x10 reps with cues for proper technique and holding for 3 seconds before lowering. With RUE support on chair performed x10 reps L hip/knee flexion onto 4" step with min A at times from therapist for proper foot placement. With RUE support: weight shifting towards L and then tapping R foot to 4" step, min/mod A for balance, pt only able to perform 4 reps at end of session due to fatigue.                   PT Education - 02/03/20 1221    Education Details importance of wearing L AFO at all times for standing/gait at home    Person(s) Educated Patient;Spouse    Methods Explanation    Comprehension Verbalized understanding            PT Short Term Goals - 01/18/20 0938      PT SHORT TERM GOAL #1   Title Pt and pt's spouse will be independent with initial HEP in order to build upon functional gains made in therapy. ALL STGS DUE 02/15/20    Time 4    Period Weeks    Status New    Target Date 02/15/20      PT SHORT TERM GOAL #2   Title Pt will perform squat pivot vs. stand pivot with supervision in order to improve functional mobility and decr caregiver burden.    Time 4    Period Weeks    Status New      PT SHORT TERM GOAL #3   Title Pt will perform sit <> stands with RW with supervision and pt able to place LUE in L hand orthosis with supervision in order to improve functional mobility.    Time 4    Period Weeks    Status New      PT SHORT TERM GOAL #4   Title Pt will ambulate at least 100' with RW and min guard in order to improve household mobility.    Time 4     Period Weeks    Status New      PT SHORT TERM GOAL #5   Title Pt will perform standing balance with single UE support at sink for 5 minutes with supervision in order to incr tolerance for ADLs.    Time 4  Period Weeks    Status New             PT Long Term Goals - 01/18/20 0941      PT LONG TERM GOAL #1   Title Pt and pt's spouse will be independent with final HEP in order to build upon functional gains made in therapy. ALL LTGS DUE 03/14/20    Time 8    Period Weeks    Status New    Target Date 03/14/20      PT LONG TERM GOAL #2   Title Pt will ambulate at least 230' with RW vs. LRAD and supervision in order to improve functional mobility.    Time 8    Period Weeks    Status New      PT LONG TERM GOAL #3   Title Pt will perform all bed mobility with mod I    Baseline 8    Period Weeks    Status New      PT LONG TERM GOAL #4   Title Pt will perform 2 steps with single HHA vs. LRAD with min guard in order to safely enter home.    Time 8    Period Weeks    Status New      PT LONG TERM GOAL #5   Title Pt will perform all transfers with mod I in order to decr caregiver burden and improve functional mobility.    Time 8    Period Weeks    Status New                 Plan - 02/03/20 1222    Clinical Impression Statement Today's skilled session focused on gait training with RW, L AFO and L hand orthosis with min guard and occassional min A for balance/steering RW and making sure pt's LUE was properly placed in L hand orthosis. Pt able to maintain a wider BOS today throughout gait with improved placement of LLE. Remainder of session focused on weight shifting/strengthening of LLE. Will continue to progress towards LTGs.    Personal Factors and Comorbidities Comorbidity 1;Past/Current Experience;Age;Profession    Comorbidities HTN    Examination-Activity Limitations Bathing;Bed Mobility;Carry;Hygiene/Grooming;Stand;Stairs;Squat;Transfers;Locomotion Level     Examination-Participation Restrictions Community Activity;Occupation   walking his dog   Stability/Clinical Decision Making Evolving/Moderate complexity    Rehab Potential Good    PT Frequency 2x / week    PT Duration --   7 weeks   PT Treatment/Interventions ADLs/Self Care Home Management;Aquatic Therapy;Electrical Stimulation;DME Instruction;Gait training;Stair training;Functional mobility training;Therapeutic activities;Therapeutic exercise;Balance training;Neuromuscular re-education;Wheelchair mobility training;Orthotic Fit/Training;Patient/family education;Passive range of motion;Energy conservation;Vestibular    PT Next Visit Plan gait training with use of RW and L hand orthosis, NMR for weightshifting through LLE, transfer training.    PT Home Exercise Plan [id number]    Consulted and Agree with Plan of Care Patient;Family member/caregiver    Family Member Consulted wife, April           Patient will benefit from skilled therapeutic intervention in order to improve the following deficits and impairments:  Abnormal gait, Decreased activity tolerance, Decreased balance, Decreased mobility, Decreased knowledge of use of DME, Decreased coordination, Decreased range of motion, Decreased strength, Difficulty walking, Impaired tone, Impaired sensation, Postural dysfunction  Visit Diagnosis: Other symptoms and signs involving the nervous system  Hemiplegia and hemiparesis following cerebral infarction affecting left non-dominant side (HCC)  Other abnormalities of gait and mobility  Muscle weakness (generalized)  Unsteadiness on feet     Problem  List Patient Active Problem List   Diagnosis Date Noted  . History of CVA (cerebrovascular accident) 01/19/2020  . Normocytic anemia   . Benign essential HTN   . Hyponatremia   . Acute blood loss anemia   . Hypotension due to drugs   . Vascular headache   . Prerenal azotemia   . Sleep disturbance   . Leukocytosis   . Transaminitis   .  Marijuana abuse   . Essential hypertension   . Dysphagia, post-stroke   . Left hemiparesis ([place])   . Acute respiratory failure ([place])   . ICH (intracerebral hemorrhage) ([place]) 12/06/2019    Arliss Journey, PT, DPT  02/03/2020, 12:33 PM  [facility] [address] [place], [place], [address] Phone: [phone]   Fax:  [phone]  Name: Dennis Mercado MRN: [id number] Date of Birth: [birth year]

## 2020-02-03 NOTE — Therapy (Signed)
[facility] [address] [place], [place], [address] Phone: [phone]   Fax:  [phone]  Speech Language Pathology Treatment  Patient Details  Name: Alberta Cairns MRN: [id number] Date of Birth: [birth year] Referring Provider (SLP): [place], [place]   Encounter Date: 02/03/2020   End of Session - 02/03/20 1325    Visit Number 4    Number of Visits 25    Date for SLP Re-Evaluation 04/20/20    Authorization Type 30 visit limit ST, 30 visits combined for PT/OT    Authorization - Visit Number 4    Authorization - Number of Visits 30    SLP Start Time 7673    SLP Stop Time  1100    SLP Time Calculation (min) 42 min    Activity Tolerance Patient tolerated treatment well           No past medical history on file.  No past surgical history on file.  There were no vitals filed for this visit.   Subjective Assessment - 02/03/20 1028    Subjective Wife opened pt's notebook and turned to [place] homework for him    Currently in Pain? No/denies                 ADULT SLP TREATMENT - 02/03/20 1028      General Information   Behavior/Cognition Alert;Cooperative;Pleasant mood      Treatment Provided   Treatment provided Cognitive-Linquistic      Pain Assessment   Pain Assessment No/denies pain      Cognitive-Linquistic Treatment   Treatment focused on Cognition;Patient/family/caregiver education    Skilled Treatment SLP educated pt and wife that pt should be one responsible for organizing and retrieving items from notebook. Reviewed homework which was 95% accurate. Pt had used strategies including highlighting important information, taking notes and writing out components of equations, which he reports he did independently. SLP encouraged pt to continue doing this. Pt completed online bill-paying simulation with mildly extended time, self-corrected 2 errors in data entry and was observed to spontaneously double  check account and card numbers when entering data. Discussed opportunities to work on pt's cognitive skills functionally, at home. Pt and wife do weekly meal planning and prep. This week pt is to write the meal plan, research recipes and make the grocery list. He is to bring this to next session, in addition to homework assigned. Pt also shared procedure for how he does scheduling and computes earnings at work; SLP to consider simulating in future sessions. 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       Assessment / Recommendations / [place] with current plan of care      Progression Toward Goals   Progression toward goals Progressing toward goals            SLP Education - 02/03/20 1324    Education Details functional cognitive activities for home    Person(s) Educated Patient;Spouse    Methods Explanation;Handout    Comprehension Verbalized understanding            SLP Short Term Goals - 02/03/20 1326      SLP SHORT TERM GOAL #1   Title pt will initiate use of memory compensations in 3 sessions    Time 3    Period Weeks  or 9 sessions   Status On-going      SLP SHORT TERM GOAL #2   Title pt will use memory compensations successfully between/in 3 sessions    Time 5   or 13 sessions   Period Weeks    Status On-going      SLP SHORT TERM GOAL #3   Title pt will tell SLP 3 cognitive linguistic deficits in 2 sessions    Time 3   or9 sessions   Period Weeks    Status On-going      SLP SHORT TERM GOAL #4   Title pt will demonstrate error awareness in therapy tasks 100% with spontaneous re-check in 3 sessions    Time 5   or 13 sessions   Period Weeks    Status On-going      SLP SHORT TERM GOAL #5   Title pt will demo selective attention in mod noisy environment for 10 minutes while completing simple-mod complex therapy  task/s in 3 sessions    Time 5    Period Weeks   or 13 sessions   Status On-going            SLP Long Term Goals - 02/03/20 1326      SLP LONG TERM GOAL #1   Title pt will demonstrate WFL attention and problem solving skills 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) to prepare for return to work 100% with self-corrections in 3 sessions    Time 11    Period Weeks   or 25 sessions, for all LTGs   Status On-going      SLP LONG TERM GOAL #2   Title pt will utilize functional  memory compensations successfully between therapy tasks reported by pt and/or wife between 4 sessions    Time 11    Period Weeks    Status On-going      SLP LONG TERM GOAL #3   Title pt will demonstrate divided attention in two simple therapy tasks in 3 sessions    Time 7    Period Weeks    Status On-going      SLP LONG TERM GOAL #4   Title pt will use compensations for attention successfully PRN in 2 sessions    Time 11    Period Weeks    Status On-going            Plan - 02/03/20 1325    Clinical Impression Statement Pt presents today with persistent cognitive linguistic deficits in areas of attention, awareness, memory, executive function, and problem solving. See "skilled intervention" for more details for today's session. I recommend skilled ST to target deficit areas mentioned before, in order to return to PLOF.    Speech Therapy Frequency 2x / week    Duration --   12 weeks, or 25 total visits   Treatment/Interventions Internal/external aids;Patient/family education;Compensatory strategies;Cognitive reorganization;Cueing hierarchy;SLP instruction and feedback;Functional tasks;Environmental controls    Potential to Achieve Goals Good    Potential Considerations Cooperation/participation level   awareness          Patient will benefit from skilled therapeutic intervention in order to improve the following deficits and impairments:   Cognitive communication  deficit    Problem List Patient Active Problem List   Diagnosis Date Noted  . History of CVA (cerebrovascular accident) 01/19/2020  . Normocytic anemia   . Benign essential HTN   . Hyponatremia   . Acute blood loss anemia   . Hypotension due to drugs   .  Vascular headache   . Prerenal azotemia   . Sleep disturbance   . Leukocytosis   . Transaminitis   . Marijuana abuse   . Essential hypertension   . Dysphagia, post-stroke   . Left hemiparesis ([place])   . Acute respiratory failure ([place])   . ICH (intracerebral hemorrhage) ([place]) 12/06/2019   Deneise Lever, [place], [place] 02/03/2020, 1:26 PM  [facility] [address] [place], [place], [address] Phone: [phone]   Fax:  [phone]   Name: Seamus Warehime MRN: [id number] Date of Birth: 01/30/1981

## 2020-02-03 NOTE — Patient Instructions (Signed)
Homework:  Dock should make the meal schedule for this week. Look up some recipes and then make a grocery list with the ingredients you need (make sure to include the amounts, too). You can also download your grocery store app and check the flyer for what's on sale.  Bring your plan with you next time.

## 2020-02-07 ENCOUNTER — Telehealth: Payer: Self-pay | Admitting: Family Medicine

## 2020-02-07 ENCOUNTER — Other Ambulatory Visit: Payer: Self-pay

## 2020-02-07 MED ORDER — AMLODIPINE BESYLATE 10 MG PO TABS: 10.0000 mg | ORAL_TABLET | Freq: Every day | ORAL | 0 refills | Status: DC

## 2020-02-07 MED ORDER — CARVEDILOL 25 MG PO TABS: 25.0000 mg | ORAL_TABLET | Freq: Two times a day (BID) | ORAL | 0 refills | Status: DC

## 2020-02-07 MED ORDER — LEVETIRACETAM 500 MG PO TABS: 500.0000 mg | ORAL_TABLET | Freq: Two times a day (BID) | ORAL | 0 refills | Status: DC

## 2020-02-07 MED ORDER — ATORVASTATIN CALCIUM 40 MG PO TABS: 40.0000 mg | ORAL_TABLET | Freq: Every day | ORAL | 0 refills | Status: DC

## 2020-02-07 MED ORDER — LISINOPRIL 5 MG PO TABS: 5.0000 mg | ORAL_TABLET | Freq: Every day | ORAL | 0 refills | Status: DC

## 2020-02-07 NOTE — Telephone Encounter (Signed)
Patient called back to check status of refill requests.

## 2020-02-07 NOTE — Telephone Encounter (Signed)
Patient aware Rx sent in  

## 2020-02-07 NOTE — Telephone Encounter (Signed)
Patient needs refills of amlodipine, carvedilol, atorvastatin, levetiraceten and lisinopril. He states the pharmacy called for refills last week. He will run out on Wednesday.

## 2020-02-08 ENCOUNTER — Ambulatory Visit (INDEPENDENT_AMBULATORY_CARE_PROVIDER_SITE_OTHER): Payer: BC Managed Care – PPO | Admitting: Adult Health

## 2020-02-08 ENCOUNTER — Encounter: Payer: Self-pay | Admitting: Adult Health

## 2020-02-08 VITALS — BP 118/72 | HR 80

## 2020-02-08 DIAGNOSIS — E785 Hyperlipidemia, unspecified: Secondary | ICD-10-CM | POA: Diagnosis not present

## 2020-02-08 DIAGNOSIS — R569 Unspecified convulsions: Secondary | ICD-10-CM

## 2020-02-08 DIAGNOSIS — I69398 Other sequelae of cerebral infarction: Secondary | ICD-10-CM

## 2020-02-08 DIAGNOSIS — I619 Nontraumatic intracerebral hemorrhage, unspecified: Secondary | ICD-10-CM | POA: Diagnosis not present

## 2020-02-08 DIAGNOSIS — I10 Essential (primary) hypertension: Secondary | ICD-10-CM | POA: Diagnosis not present

## 2020-02-08 MED ORDER — LEVETIRACETAM 500 MG PO TABS: 500.0000 mg | ORAL_TABLET | Freq: Two times a day (BID) | ORAL | 3 refills | Status: DC

## 2020-02-08 NOTE — Progress Notes (Signed)
[facility] [address] [place]. [place] [address] [phone]       HOSPITAL FOLLOW UP NOTE  [name] Date of Birth:  [birth year] Medical Record Number:  [id number]   Reason for Referral:  hospital stroke follow up    SUBJECTIVE:   CHIEF COMPLAINT:  Chief Complaint  Patient presents with   Hospitalization Follow-up    HFU after stroke. States he has been doing well since being home   room 5    with wife    HPI:   [name] is a 39 y.o. male with recent HTN at dentist office with plans for PCP visit next week, who presented on 12/06/2019 post coitus with L sided weakness, and elevated BP. 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 Hypertensive emergency upon arrival with BP 119/162 treated with Cleviprex and placed on Coreg, amlodipine, hydralazine, clonidine and lisinopril. Seizure activity following extubation on 6/25 discharged on Keppra with EEG showing severe diffuse encephalopathy (likely post sedation). LDL 157 and initiate atorvastatin 40 mg daily. Other stroke risk factors include EtOH use and THC use but no prior stroke history. Residual deficits of dysphagia, right gaze preference, left hemianopia, left facial droop, and left hemiparesis. Evaluated by therapy who recommended CIR for ongoing therapy needs  ICH - R thalamic ICH w/ IVH likely hypertensive   CT head R thalamic hemorrhage, 59mL. IVH. Local mass effect w/o midline shift  CTA head Unremarkable   CT head at 12h stable  CT head 6/22 0440 stable R thalamic hemorrhage, IVH. Sllight increased in ventricle size. 4-5 midline shift  CT head 6/22 remains stable     MRI 6/23 - Unchanged size of right thalamic ICH with mild surrounding edema. No hydrocephalus or ventricular entrapment.   CT Head 6/26 - No substantial change  EEG - severe diffuse encephalopathy  Carotid Doppler - unremarkable  2D Echo EF 60-65%. No source  of embolus    LDL 157  HgbA1c 5.6   Heparin subq for VTE prophylaxis  No antithrombotic prior to admission, now on No antithrombotic given hemorrhage   Therapy recommendations: CIR   Disposition:  CIR  Seizure   Agitation following extubation 6/25  L trapezius muscle contractions, extending to R trapezius, with subsequent Upper and lower extremity rigidity with rhythmic contraction. Pt unresponsive, upward deviated gaze. C/w seizure   S/p ativan  On depakote 500 Q6h -> 750 Q8h -> off  Now on keppra  EEG severe diffuse encephalopathy  Depakote level - 39      Today, 02/08/2020, [name] is being seen for hospital follow-up accompanied by his wife. He was discharged home from CIR on 01/12/2020 after a 28-day stay.  He has been making excellent progress with residual hemiparesis, cognitive impairment, and left facial droop Denies residual dysphagia or visual impairment Currently working with neuro rehab PT and speech with ongoing improvement Able to ambulate with RW short distance; w/c long distance Denies new or worsening stroke/TIA symptoms  No recurrent seizure activity and remains on Keppra 500 mg twice daily tolerating well without side effects  Continues on atorvastatin 40 mg daily without myalgias Blood pressure today 118/72 and remains on lisinopril, clonidine, carvedilol and amlodipine Monitors at home which has been stable Reports complete cessation of THC, tobacco and EtOH use  No concerns      ROS:   14 system review of systems performed and negative with exception of weakness, memory loss  PMH: No  past medical history on file.  PSH: No past surgical history on file.  Social History:  Social History   Socioeconomic History   Marital status: Married    Spouse name: Not on file   Number of children: Not on file   Years of education: Not on file   Highest education level: Not on file  Occupational History   Not on file  Tobacco Use    Smoking status: Never Smoker   Smokeless tobacco: Never Used  Substance and Sexual Activity   Alcohol use: Yes    Comment: several drinks over the weekend   Drug use: Never   Sexual activity: Not on file  Other Topics Concern   Not on file  Social History Narrative   Not on file   Social Determinants of Health   Financial Resource Strain:    Difficulty of Paying Living Expenses: Not on file  Food Insecurity:    Worried About [place] in the Last Year: Not on file   Ran Out of Food in the Last Year: Not on file  Transportation Needs:    Lack of Transportation (Medical): Not on file   Lack of Transportation (Non-Medical): Not on file  Physical Activity:    Days of Exercise per Week: Not on file   Minutes of Exercise per Session: Not on file  Stress:    Feeling of Stress : Not on file  Social Connections:    Frequency of Communication with Friends and Family: Not on file   Frequency of Social Gatherings with Friends and Family: Not on file   Attends Religious Services: Not on file   Active Member of Clubs or Organizations: Not on file   Attends Archivist Meetings: Not on file   Marital Status: Not on file  Intimate Partner Violence:    Fear of Current or Ex-Partner: Not on file   Emotionally Abused: Not on file   Physically Abused: Not on file   Sexually Abused: Not on file    Family History: No family history on file.  Medications:   Current Outpatient Medications on File Prior to Visit  Medication Sig Dispense Refill   acetaminophen (TYLENOL) 325 MG tablet Take 2 tablets (650 mg total) by mouth every 4 (four) hours as needed for mild pain (or temp > 37.5 C (99.5 F)).     amLODipine (NORVASC) 10 MG tablet Take 1 tablet (10 mg total) by mouth daily. 90 tablet 0   atorvastatin (LIPITOR) 40 MG tablet Take 1 tablet (40 mg total) by mouth daily. 90 tablet 0   carvedilol (COREG) 25 MG tablet Take 1 tablet (25 mg total) by  mouth 2 (two) times daily with a meal. 90 tablet 0   cloNIDine (CATAPRES) 0.1 MG tablet Take 1 tablet (0.1 mg total) by mouth 2 (two) times daily. 60 tablet 11   levETIRAcetam (KEPPRA) 500 MG tablet Take 1 tablet (500 mg total) by mouth 2 (two) times daily. 180 tablet 0   lisinopril (ZESTRIL) 5 MG tablet Take 1 tablet (5 mg total) by mouth daily. 90 tablet 0   omeprazole (PRILOSEC OTC) 20 MG tablet Take 20 mg by mouth daily.     No current facility-administered medications on file prior to visit.    Allergies:   Allergies  Allergen Reactions   Percocet [Oxycodone-Acetaminophen] Itching    Pt says he has previously tolerated.       OBJECTIVE:  Physical Exam  Vitals:   02/08/20 1110  BP: 118/72  Pulse: 80   There is no height or weight on file to calculate BMI. No exam data present   General: well developed, well nourished,  very pleasant middle-aged African-American male, seated, in no evident distress Head: head normocephalic and atraumatic.   Neck: supple with no carotid or supraclavicular bruits Cardiovascular: regular rate and rhythm, no murmurs Musculoskeletal: no deformity Skin:  no rash/petichiae Vascular:  Normal pulses all extremities   Neurologic Exam Mental Status: Awake and fully alert.   Fluent speech and language.  Oriented to place and time. Recent and remote memory intact. Attention span, concentration and fund of knowledge appropriate during visit. Mood and affect appropriate.  Cranial Nerves: Fundoscopic exam reveals sharp disc margins. Pupils equal, briskly reactive to light. Extraocular movements full without nystagmus. Visual fields full to confrontation. Hearing intact. Facial sensation intact.  Left lower facial weakness.  Tongue, and palate moves normally and symmetrically.  Motor:  LUE: 3/5 with slight handgrip; increased tone deltoid, wrist and fingers LLE: 3/5 with increased spasticity with knee flexion/extension Full strength right upper  and lower extremity Sensory.: intact to touch , pinprick , position and vibratory sensation.  Coordination: Rapid alternating movements normal on right side. Finger-to-nose and heel-to-shin performed accurately on right side. Gait and Station: Deferred as rolling walker not present during visit Reflexes: 2+ brisk LUE and LLE; 1+ RUE and RLE. Toes downgoing.      NIHSS 6 1a.  Level of consciousness 0 1b. LOC questions 0 1c. LOC commands 0 2.  Best gaze 0 3.  Visual 0 4.  Facial palsy 2 5a.  Motor arm-left 2 5b.  Motor arm-right 0 6a.  Motor leg-left 2 6b.  Motor leg-right 0 7.  Limb ataxia 0 8.  Sensory 0 9.  Best language 0 10.  Dysarthria 0 11.  Extinction and inattention 0  Modified Rankin  3      ASSESSMENT: Dennis Mercado is a 39 y.o. year old male presented with left-sided weakness and hypertensive emergency on 12/06/2019 with stroke work-up revealing right thalamic ICH with IVH and midline shift secondary to hypertension. Recently diagnosed HTN at dentist office but had not had PCP follow-up prior to event. Also has seizure recurrence during admission placed on Keppra. Vascular risk factors include newly diagnosed HTN, HLD, poststroke seizures, EtOH use and substance abuse.      PLAN:  1. Hypertensive right thalamic ICH:  a. Residual deficit: Left hemiparesis with spasticity, left facial droop and cognitive impairment.  iDoristine Devoid improvement compared to hospitalization.  Encouraged continued exercises and participation with outpatient therapy for likely ongoing improvement ii. Denies spasticity pain and discussed importance of proper shoulder position b. Continue atorvastatin 40 mg daily for secondary stroke prevention.  c. No indication for antithrombotic as no prior stroke history or cardiovascular risk factors d. Close PCP follow up for aggressive stroke risk factor management  2. Seizures, poststroke: a. Stable without recurrent seizure activity b. Continue  Keppra 500 mg twice daily -refill provided 3. HTN:  a. BP goal <130/90.  b. Stable; continue current medications and ongoing monitoring at home c. Managed by PCP 4. HLD:  a. LDL goal <70. Recent LDL 157.  b. Continue atorvastatin 40 mg daily c. Management prescribed by PCP 5. Hx ETOH and substance use a. Congratulated on complete cessation and highly encouraged continuation of cessation    Follow up in 3 months or call earlier if needed   I spent 45 minutes of face-to-face and non-face-to-face time  with patient and wife.  This included previsit chart review, lab review, study review, order entry, electronic health record documentation, patient education regarding recent stroke and etiology, residual deficits, importance of managing stroke risk factors and answered all questions to patient and wife's satisfaction     Frann Rider, AGNP-BC  [facility] [address] [place], [place] [address]  Phone [phone] Fax [phone] Note: This document was prepared with digital dictation and possible smart phrase technology. Any transcriptional errors that result from this process are unintentional.

## 2020-02-08 NOTE — Patient Instructions (Signed)
Ongoing participation in outpatient therapy for likely ongoing improvement  Continue keppra 500mg  twice daily for seizure prevention  Continue lipitor  for secondary stroke prevention  Continue to follow up with PCP regarding cholesteorl and blood presure management  Maintain strict control of hypertension with blood pressure goal below 130/90, diabetes with hemoglobin A1c goal below 6.5% and cholesterol with LDL cholesterol (bad cholesterol) goal below 70 mg/dL.      Followup in the future with me in 3 months or call earlier if needed       Thank you for coming to see [place] at [facility]. I hope we have been able to provide you high quality care today.  You may receive a patient satisfaction survey over the next few weeks. We would appreciate your feedback and comments so that we may continue to improve ourselves and the health of our patients.

## 2020-02-09 ENCOUNTER — Ambulatory Visit: Payer: BC Managed Care – PPO | Admitting: Physical Therapy

## 2020-02-09 ENCOUNTER — Ambulatory Visit: Payer: BC Managed Care – PPO

## 2020-02-09 ENCOUNTER — Other Ambulatory Visit: Payer: Self-pay

## 2020-02-09 ENCOUNTER — Ambulatory Visit: Payer: BC Managed Care – PPO | Admitting: Occupational Therapy

## 2020-02-09 DIAGNOSIS — R2689 Other abnormalities of gait and mobility: Secondary | ICD-10-CM | POA: Diagnosis not present

## 2020-02-09 DIAGNOSIS — M6281 Muscle weakness (generalized): Secondary | ICD-10-CM | POA: Diagnosis not present

## 2020-02-09 DIAGNOSIS — R41841 Cognitive communication deficit: Secondary | ICD-10-CM | POA: Diagnosis not present

## 2020-02-09 DIAGNOSIS — R209 Unspecified disturbances of skin sensation: Secondary | ICD-10-CM | POA: Diagnosis not present

## 2020-02-09 DIAGNOSIS — R2681 Unsteadiness on feet: Secondary | ICD-10-CM

## 2020-02-09 DIAGNOSIS — I69354 Hemiplegia and hemiparesis following cerebral infarction affecting left non-dominant side: Secondary | ICD-10-CM | POA: Diagnosis not present

## 2020-02-09 DIAGNOSIS — R29818 Other symptoms and signs involving the nervous system: Secondary | ICD-10-CM

## 2020-02-09 DIAGNOSIS — R414 Neurologic neglect syndrome: Secondary | ICD-10-CM | POA: Diagnosis not present

## 2020-02-09 DIAGNOSIS — R41842 Visuospatial deficit: Secondary | ICD-10-CM | POA: Diagnosis not present

## 2020-02-09 NOTE — Therapy (Signed)
[place] [address] [place], [place], [address] Phone: [phone]   Fax:  [phone]  Speech Language Pathology Treatment  Patient Details  Name: Dennis Mercado MRN: [id number] Date of Birth: 1981/02/10 Referring Provider (SLP): [place], [place]   Encounter Date: 02/09/2020   End of Session - 02/09/20 1317    Visit Number 5    Number of Visits 25    Date for SLP Re-Evaluation 04/20/20    Authorization Type 30 visit limit ST, 30 visits combined for PT/OT    Authorization - Visit Number 5    Authorization - Number of Visits 30    SLP Start Time [phone]    SLP Stop Time  1016    SLP Time Calculation (min) 42 min    Activity Tolerance Patient tolerated treatment well           History reviewed. No pertinent past medical history.  History reviewed. No pertinent surgical history.  There were no vitals filed for this visit.   Subjective Assessment - 02/09/20 0949    Subjective "I got the mal plan and stuff."    Patient is accompained by: --   April wife   Currently in Pain? No/denies                 ADULT SLP TREATMENT - 02/09/20 0951      General Information   Behavior/Cognition Alert;Cooperative;Pleasant mood      Treatment Provided   Treatment provided Cognitive-Linquistic      Cognitive-Linquistic Treatment   Treatment focused on Cognition;Patient/family/caregiver education    Skilled Treatment Discussin with pt and wife about planner. SLP educated them about rationale for weekly planner and provided education about how to use for pt's benefit (see pt instructions). Because wife placed pt's notebook in front of him at the start of session, SLP educated wife on holding back and allowing pt to be more independent with things like managing his notebook. She demonstrated understanidng and agreed with this idea.      Assessment / Recommendations / Plan   Plan Continue with current plan of care       Progression Toward Goals   Progression toward goals Progressing toward goals            SLP Education - 02/09/20 1316    Education Details how to cue pt/incr pt independence at home (specifically with notebook), weekly planning worksheet and rationale    Person(s) Educated Patient;Spouse    Methods Explanation;Demonstration;Handout    Comprehension Verbalized understanding;Need further instruction;Verbal cues required            SLP Short Term Goals - 02/09/20 1317      SLP SHORT TERM GOAL #1   Title pt will initiate use of memory compensations in 3 sessions    Time 2    Period Weeks   or 9 sessions   Status On-going      SLP SHORT TERM GOAL #2   Title pt will use memory compensations successfully between/in 3 sessions    Time 2   or 13 sessions   Period Weeks    Status On-going      SLP SHORT TERM GOAL #3   Title pt will tell SLP 3 cognitive linguistic deficits in 2 sessions    Time 2   or9 sessions   Period Weeks    Status On-going      SLP SHORT TERM GOAL #4   Title pt will demonstrate error  awareness in therapy tasks 100% with spontaneous re-check in 3 sessions    Time 4   or 13 sessions   Period Weeks    Status On-going      SLP SHORT TERM GOAL #5   Title pt will demo selective attention in mod noisy environment for 10 minutes while completing simple-mod complex therapy task/s in 3 sessions    Time 4    Period Weeks   or 13 sessions   Status On-going            SLP Long Term Goals - 02/09/20 1318      SLP LONG TERM GOAL #1   Title pt will demonstrate WFL attention and problem solving skills 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) to prepare for return to work 100% with self-corrections in 3 sessions    Time 10    Period Weeks   or 25 sessions, for all LTGs   Status On-going      SLP LONG TERM GOAL #2   Title pt will utilize functional  memory compensations successfully between therapy tasks reported by pt and/or wife between 4  sessions    Time 10    Period Weeks    Status On-going      SLP LONG TERM GOAL #3   Title pt will demonstrate divided attention in two simple therapy tasks in 3 sessions    Time 6    Period Weeks    Status On-going      SLP LONG TERM GOAL #4   Title pt will use compensations for attention successfully PRN in 2 sessions    Time 10    Period Weeks    Status On-going            Plan - 02/09/20 1317    Clinical Impression Statement Pt presents today with persistent cognitive linguistic deficits in areas of attention, awareness, memory, executive function, and problem solving. See "skilled intervention" for more details for today's session. I recommend skilled ST to target deficit areas mentioned before, in order to return to PLOF.    Speech Therapy Frequency 2x / week    Duration --   12 weeks, or 25 total visits   Treatment/Interventions Internal/external aids;Patient/family education;Compensatory strategies;Cognitive reorganization;Cueing hierarchy;SLP instruction and feedback;Functional tasks;Environmental controls    Potential to Achieve Goals Good    Potential Considerations Cooperation/participation level   awareness          Patient will benefit from skilled therapeutic intervention in order to improve the following deficits and impairments:   Cognitive communication deficit    Problem List Patient Active Problem List   Diagnosis Date Noted  . History of CVA (cerebrovascular accident) 01/19/2020  . Normocytic anemia   . Benign essential HTN   . Hyponatremia   . Acute blood loss anemia   . Hypotension due to drugs   . Vascular headache   . Prerenal azotemia   . Sleep disturbance   . Leukocytosis   . Transaminitis   . Marijuana abuse   . Essential hypertension   . Dysphagia, post-stroke   . Left hemiparesis ([place])   . Acute respiratory failure ([place])   . ICH (intracerebral hemorrhage) ([facility]) 12/06/2019    [facility] ,MS, [place]  02/09/2020, 1:19  PM  [place] [address] [place], [place], [address] Phone: [phone]   Fax:  [phone]   Name: Dennis Mercado MRN: [id number] Date of Birth: 02/07/1981

## 2020-02-09 NOTE — Therapy (Signed)
[facility] [address] [place], [place], [address] Phone: [phone]   Fax:  [phone]  Occupational Therapy Treatment  Patient Details  Name: Dennis Mercado MRN: [id number] Date of Birth: [birth year] Referring Provider (OT): Dr. Letta Pate   Encounter Date: 02/09/2020   OT End of Session - 02/09/20 0939    Visit Number 5    Number of Visits 25   POC written for 12 weeks, 2x week will need to adjust freq due to insurance   Date for OT Re-Evaluation 04/20/20    Authorization Type BCBS    Authorization Time Period 90 days- 30 VL combined for OT/ PT, will adjust frequency accordingly    Authorization - Visit Number 5    Authorization - Number of Visits 15    OT Start Time 0845    OT Stop Time 0930    OT Time Calculation (min) 45 min    Activity Tolerance Patient tolerated treatment well    Behavior During Therapy [facility] for tasks assessed/performed           No past medical history on file.  No past surgical history on file.  There were no vitals filed for this visit.   Subjective Assessment - 02/09/20 0938    Subjective  Denies pain    Pertinent History Pt. presented to ED on 12/06/2019 with left side weakness and  altered mental status s/p coitus.  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 Pt required intubation 6/22 due to ARF secondary to aspiration PNA. Received inpatient rehab. Pt was discharged 7/28/2    Limitations HTN, left inattention, apraxia    Patient Stated Goals to be able to use my hand    Currently in Pain? No/denies           Practiced donning/doffing Rt shoe and sock I'ly (pt has side zipper for shoes which works really well). Pt will likely need some assist to don Lt shoe and sock but instructed to doff I'ly. Pt simulating clothes management for toileting to don/doff pants over hips w/ CGA for  safety/balance only.   Wt bearing over LUE full arm and on elbow w/ body on arm movements including contralateral reaching, trunk rotation, and lateral trunk flexion. Pt required mod cueing d/t decreased body awareness and apraxia. Closed chain activities w/ tilted stool for low range AA/ROM in sh flexion/extension. Closed chain sh flexion against therapist shoulder w/ max cues to maintain contact and to push through arm/heel of hand.                         OT Short Term Goals - 02/09/20 0939      OT SHORT TERM GOAL #1   Title I with inital HEP    Time 6    Period Weeks    Status Achieved      OT SHORT TERM GOAL #2   Title Pt will donn shirt with supervision and min v.c, and pants with min A    Time 6    Period Weeks    Status On-going      OT SHORT TERM GOAL #3   Title Pt will attend to items in L hemibody space with no more than min v.c    Time 6    Period Weeks    Status On-going      OT SHORT TERM GOAL #4  Title Pt will use LUE as stabilizer 10%of the time with min v.c    Time 6    Period Weeks    Status On-going      OT SHORT TERM GOAL #5   Title Pt will transfer to toilet with close supervison    Time 6    Period Weeks    Status New             OT Long Term Goals - 01/21/20 1544      OT LONG TERM GOAL #1   Title I with updated HEP    Time 12    Period Weeks    Status New      OT LONG TERM GOAL #2   Title Pt will donn shirt mod I and perfrom LB dressing with min A    Time 12    Period Weeks    Status New      OT LONG TERM GOAL #3   Title Pt. will bathe with min A    Time 12    Period Weeks    Status New      OT LONG TERM GOAL #4   Title Pt will demonstrate 40* A/ROM shoulder flexion in prep for reach    Time 12    Period Weeks    Status New      OT LONG TERM GOAL #5   Title Pt will perform tabletop scanning activities with 75%better accuracy    Time 12    Period Weeks    Status New      OT LONG TERM GOAL #6   Title  Pt will demonstrate 50% elbow flexion/ extension in LUE  for functional use.    Time 12    Period Weeks    Status New                 Plan - 02/09/20 4008    Clinical Impression Statement Pt has met STG #1. Pt progressing towards remaining goals. Pt with increased independence with dressing. 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    OT Occupational Profile and History Detailed Assessment- Review of Records and additional review of physical, cognitive, psychosocial history related to current functional performance    Occupational performance deficits (Please refer to evaluation for details): ADL's;IADL's;Leisure;Social Participation;Work;Play    Body Structure / Function / Physical Skills ADL;Balance;Endurance;Mobility;Strength;UE functional use;FMC;Vision;Coordination;Gait;Decreased knowledge of precautions;GMC;ROM;Decreased knowledge of use of DME;Dexterity;IADL;Sensation    Cognitive Skills Attention;Memory;Problem Solve;Safety Awareness;Thought;Understand    Rehab Potential Fair    Clinical Decision Making Limited treatment options, no task modification necessary    Comorbidities Affecting Occupational Performance: May have comorbidities impacting occupational performance    Modification or Assistance to Complete Evaluation  No modification of tasks or assist necessary to complete eval    OT Frequency 2x / week   plus eval   OT Duration 12 weeks    OT Treatment/Interventions Self-care/ADL training;Therapeutic exercise;Functional Mobility Training;Balance training;Splinting;Manual Therapy;Neuromuscular education;Aquatic Therapy;Ultrasound;Energy conservation;Therapeutic activities;Cryotherapy;Paraffin;DME and/or AE instruction;Cognitive remediation/compensation;Visual/perceptual remediation/compensation;Gait Training;Fluidtherapy;Electrical Stimulation;Moist Heat;Contrast Bath;Passive range of motion;Patient/family education    Plan Continue NMR     Consulted and Agree with Plan of Care Patient;Family member/caregiver           Patient will benefit from skilled therapeutic intervention in order to improve the following deficits and impairments:   Body Structure / Function / Physical Skills: ADL, Balance, Endurance, Mobility, Strength, UE functional use, FMC, Vision, Coordination, Gait, Decreased knowledge of precautions,  GMC, ROM, Decreased knowledge of use of DME, Dexterity, IADL, Sensation Cognitive Skills: Attention, Memory, Problem Solve, Safety Awareness, Thought, Understand     Visit Diagnosis: Hemiplegia and hemiparesis following cerebral infarction affecting left non-dominant side (HCC)  Other symptoms and signs involving the nervous system  Unsteadiness on feet    Problem List Patient Active Problem List   Diagnosis Date Noted  . History of CVA (cerebrovascular accident) 01/19/2020  . Normocytic anemia   . Benign essential HTN   . Hyponatremia   . Acute blood loss anemia   . Hypotension due to drugs   . Vascular headache   . Prerenal azotemia   . Sleep disturbance   . Leukocytosis   . Transaminitis   . Marijuana abuse   . Essential hypertension   . Dysphagia, post-stroke   . Left hemiparesis ([place])   . Acute respiratory failure ([place])   . ICH (intracerebral hemorrhage) ([place]) 12/06/2019    Carey Bullocks, OTR/L 02/09/2020, 9:48 AM  [facility] [address] [place], [place], [address] Phone: [phone]   Fax:  [phone]  Name: Haroon Shatto MRN: [id number] Date of Birth: August 17, 1980

## 2020-02-09 NOTE — Patient Instructions (Addendum)
In your notebook, the night before the next day, write the date and your schedule and to do list for the next day. Include any appointments, errands, chores or activities, visits with family or friends, phone calls you need to make, etc. Put alarms in your phone for things on your schedule so you can execute them when the time comes.  Review your schedule each morning and throughout the day. Keep it with you when you go out. Check items off your list as you complete them.   Bring your notebook when you come therapy.

## 2020-02-10 ENCOUNTER — Ambulatory Visit: Payer: BC Managed Care – PPO

## 2020-02-10 DIAGNOSIS — R41841 Cognitive communication deficit: Secondary | ICD-10-CM

## 2020-02-10 DIAGNOSIS — R209 Unspecified disturbances of skin sensation: Secondary | ICD-10-CM | POA: Diagnosis not present

## 2020-02-10 DIAGNOSIS — M6281 Muscle weakness (generalized): Secondary | ICD-10-CM | POA: Diagnosis not present

## 2020-02-10 DIAGNOSIS — R29818 Other symptoms and signs involving the nervous system: Secondary | ICD-10-CM | POA: Diagnosis not present

## 2020-02-10 DIAGNOSIS — R41842 Visuospatial deficit: Secondary | ICD-10-CM | POA: Diagnosis not present

## 2020-02-10 DIAGNOSIS — R414 Neurologic neglect syndrome: Secondary | ICD-10-CM | POA: Diagnosis not present

## 2020-02-10 DIAGNOSIS — R2689 Other abnormalities of gait and mobility: Secondary | ICD-10-CM | POA: Diagnosis not present

## 2020-02-10 DIAGNOSIS — I69354 Hemiplegia and hemiparesis following cerebral infarction affecting left non-dominant side: Secondary | ICD-10-CM | POA: Diagnosis not present

## 2020-02-10 DIAGNOSIS — R2681 Unsteadiness on feet: Secondary | ICD-10-CM | POA: Diagnosis not present

## 2020-02-10 NOTE — Patient Instructions (Signed)
  Please complete the assigned speech therapy homework prior to your next session and return it to the speech therapist at your next visit.  

## 2020-02-10 NOTE — Therapy (Signed)
[facility] [address] [place], [place], [address] Phone: [phone]   Fax:  [phone]  Speech Language Pathology Treatment  Patient Details  Name: Dennis Mercado MRN: [id number] Date of Birth: Jul 16, 1980 Referring Provider (SLP): Dennis Mercado, [place]   Encounter Date: 02/10/2020   End of Session - 02/10/20 1249    Visit Number 6    Number of Visits 25    Date for SLP Re-Evaluation 04/20/20    Authorization Type 30 visit limit ST, 30 visits combined for PT/OT    Authorization - Visit Number 6    Authorization - Number of Visits 30    SLP Start Time 1023   6  minutes late   SLP Stop Time  1101    SLP Time Calculation (min) 38 min    Activity Tolerance Patient tolerated treatment well           History reviewed. No pertinent past medical history.  History reviewed. No pertinent surgical history.  There were no vitals filed for this visit.   Subjective Assessment - 02/10/20 1031    Subjective Pt brought in his notebook, in [place].    Patient is accompained by: Family member   Dennis Mercado - wife   Currently in Pain? No/denies                 ADULT SLP TREATMENT - 02/10/20 1032      General Information   Behavior/Cognition Alert;Cooperative;Pleasant mood      Treatment Provided   Treatment provided Cognitive-Linquistic      Cognitive-Linquistic Treatment   Treatment focused on Cognition;Patient/family/caregiver education    Skilled Treatment Pt with his notebook on his lap (instead of wife carrying) when he arrived at ST 6 minutes late this AM. Pt got out his ST homework with min extra time finding it. Pt showed SLP previous homework which this SLP was going to have pt complete today, with mod extra time to get to ST tab in his notebook. With a detailed written sequencing task, pt demonstrated emergent awareness x3 with this task.  SLP asked pt questions between items he was writing down to assess  and monitor alternating attention. Pt with slight extra time back to task once, of three ooportunties with this.       Assessment / Recommendations / Plan   Plan Continue with current plan of care      Progression Toward Goals   Progression toward goals Progressing toward goals              SLP Short Term Goals - 02/10/20 1250      SLP SHORT TERM GOAL #1   Title pt will initiate use of memory compensations in 3 sessions    Baseline 02-10-20    Time 2    Period Weeks   or 9 sessions   Status On-going      SLP SHORT TERM GOAL #2   Title pt will use memory compensations successfully between/in 3 sessions    Baseline 02-10-20    Time 2   or 13 sessions   Period Weeks    Status On-going      SLP SHORT TERM GOAL #3   Title pt will tell SLP 3 cognitive linguistic deficits in 2 sessions    Time 2   or9 sessions   Period Weeks    Status On-going      SLP SHORT TERM GOAL #4   Title pt will demonstrate error awareness  in therapy tasks 100% with spontaneous re-check, in 3 sessions    Baseline 02-10-20    Time 4   or 13 sessions   Period Weeks    Status On-going      SLP SHORT TERM GOAL #5   Title pt will demo selective attention in mod noisy environment for 10 minutes while completing simple-mod complex therapy task/s in 3 sessions    Time 4    Period Weeks   or 13 sessions   Status On-going            SLP Long Term Goals - 02/10/20 1252      SLP LONG TERM GOAL #1   Title pt will demonstrate WFL attention and problem solving skills 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) to prepare for return to work 100% with self-corrections in 3 sessions    Time 10    Period Weeks   or 25 sessions, for all LTGs   Status On-going      SLP LONG TERM GOAL #2   Title pt will utilize functional  memory compensations successfully between therapy tasks reported by pt and/or wife between 4 sessions    Time 10    Period Weeks    Status On-going      SLP LONG TERM GOAL  #3   Title pt will demonstrate divided attention in two simple therapy tasks in 3 sessions    Time 6    Period Weeks    Status On-going      SLP LONG TERM GOAL #4   Title pt will use compensations for attention successfully PRN in 2 sessions    Time 10    Period Weeks    Status On-going            Plan - 02/10/20 1249    Clinical Impression Statement Pt presents today with persistent cognitive linguistic deficits in areas of attention, awareness, memory, executive function, and problem solving. See "skilled intervention" for more details for today's session. I recommend skilled ST to target deficit areas mentioned before, in order to return to PLOF.    Speech Therapy Frequency 2x / week    Duration --   12 weeks, or 25 total visits   Treatment/Interventions Internal/external aids;Patient/family education;Compensatory strategies;Cognitive reorganization;Cueing hierarchy;SLP instruction and feedback;Functional tasks;Environmental controls    Potential to Achieve Goals Good    Potential Considerations Cooperation/participation level   awareness          Patient will benefit from skilled therapeutic intervention in order to improve the following deficits and impairments:   Cognitive communication deficit    Problem List Patient Active Problem List   Diagnosis Date Noted   History of CVA (cerebrovascular accident) 01/19/2020   Normocytic anemia    Benign essential HTN    Hyponatremia    Acute blood loss anemia    Hypotension due to drugs    Vascular headache    Prerenal azotemia    Sleep disturbance    Leukocytosis    Transaminitis    Marijuana abuse    Essential hypertension    Dysphagia, post-stroke    Left hemiparesis (HCC)    Acute respiratory failure (HCC)    ICH (intracerebral hemorrhage) ([place]) 12/06/2019    [place] ,[place], [place]  02/10/2020, 12:53 PM  [facility] [address]  [place], [place], [address] Phone: [phone]   Fax:  [phone]   Name: Dennis Mercado MRN: [id number] Date of Birth: [birth year]

## 2020-02-11 ENCOUNTER — Encounter: Payer: Self-pay | Admitting: Physical Therapy

## 2020-02-11 ENCOUNTER — Other Ambulatory Visit: Payer: Self-pay

## 2020-02-11 ENCOUNTER — Encounter: Payer: BC Managed Care – PPO | Admitting: Occupational Therapy

## 2020-02-11 ENCOUNTER — Ambulatory Visit: Payer: BC Managed Care – PPO | Admitting: Physical Therapy

## 2020-02-11 DIAGNOSIS — R41841 Cognitive communication deficit: Secondary | ICD-10-CM | POA: Diagnosis not present

## 2020-02-11 DIAGNOSIS — R29818 Other symptoms and signs involving the nervous system: Secondary | ICD-10-CM | POA: Diagnosis not present

## 2020-02-11 DIAGNOSIS — R2681 Unsteadiness on feet: Secondary | ICD-10-CM | POA: Diagnosis not present

## 2020-02-11 DIAGNOSIS — R2689 Other abnormalities of gait and mobility: Secondary | ICD-10-CM

## 2020-02-11 DIAGNOSIS — R209 Unspecified disturbances of skin sensation: Secondary | ICD-10-CM | POA: Diagnosis not present

## 2020-02-11 DIAGNOSIS — R41842 Visuospatial deficit: Secondary | ICD-10-CM | POA: Diagnosis not present

## 2020-02-11 DIAGNOSIS — M6281 Muscle weakness (generalized): Secondary | ICD-10-CM | POA: Diagnosis not present

## 2020-02-11 DIAGNOSIS — I69354 Hemiplegia and hemiparesis following cerebral infarction affecting left non-dominant side: Secondary | ICD-10-CM

## 2020-02-11 DIAGNOSIS — R414 Neurologic neglect syndrome: Secondary | ICD-10-CM | POA: Diagnosis not present

## 2020-02-11 NOTE — Therapy (Signed)
[facility] [address] [place], [place], [address] Phone: [phone]   Fax:  [phone]  Physical Therapy Treatment  Patient Details  Name: Dennis Mercado MRN: [id number] Date of Birth: 18-May-1981 Referring Provider (PT): [place]i, Chapman Fitch (will be followed by Dr. Letta Pate)   Encounter Date: 02/11/2020   PT End of Session - 02/11/20 1119    Visit Number 5    Number of Visits 15    Date for PT Re-Evaluation 03/18/20    Authorization Type BCBS, VL for PT and OT = 30 (zero used, if seen on same day, will count as 2 visits), VL for ST = 30    Authorization - Visit Number 5    Authorization - Number of Visits 15    PT Start Time 0803    PT Stop Time 4010    PT Time Calculation (min) 44 min    Equipment Utilized During Treatment Gait belt    Activity Tolerance Patient tolerated treatment well    Behavior During Therapy [facility] for tasks assessed/performed           History reviewed. No pertinent past medical history.  History reviewed. No pertinent surgical history.  There were no vitals filed for this visit.   Subjective Assessment - 02/11/20 0806    Subjective Has been doing his exercises at home, nothing new. No falls.    Pertinent History HTN    Patient Stated Goals wants to start walking as soon as possible - "less walker and downgrade to a cane and then nothing at all"    Currently in Pain? No/denies                             [facility] Adult PT Treatment/Exercise - 02/11/20 0001      Transfers   Transfers Sit to Stand;Stand to Sit;Stand Pivot Transfers    Sit to Stand 4: Min guard;With upper extremity assist;From bed;From chair/3-in-1    Sit to Stand Details Verbal cues for sequencing;Verbal cues for precautions/safety;Manual facilitation for weight shifting;Manual facilitation for placement;Verbal cues for safe use of DME/AE    Sit to Stand Details (indicate cue type and reason) cues  for wider BOS before standing and RUE placement on mat    Stand to Sit 4: Min guard;With upper extremity assist;To bed;To chair/3-in-1    Stand to Sit Details (indicate cue type and reason) Verbal cues for sequencing;Verbal cues for precautions/safety;Verbal cues for safe use of DME/AE;Manual facilitation for weight shifting;Manual facilitation for placement    Stand to Sit Details cues to remove L hand from orthosis prior to sitting    Stand Pivot Transfers 4: Min assist    Stand Pivot Transfer Details (indicate cue type and reason) cues for sequencing and stepping, pt's RLE on therapist's shoulder for balance      Ambulation/Gait   Ambulation/Gait Yes    Ambulation/Gait Assistance 4: Min guard;4: Min assist    Ambulation/Gait Assistance Details pt demonstrating more consistent step through pattern with incr weight shift over to LLE and improved RLE step length, intermittent verbal and tactile cues for L quad activation and wider BOS, needing assist for balance, steering RW at times, and to make sure L hand stays in orthosis    Ambulation Distance (Feet) 115 Feet    Assistive device Rolling walker;Other (Comment)    Gait Pattern Step-to pattern;Decreased step length - right;Decreased stance time - left;Decreased hip/knee flexion - left;Decreased weight  shift to left;Left flexed knee in stance;Narrow base of support;Poor foot clearance - left;Step-through pattern    Ambulation Surface Level;Indoor      Neuro Re-ed    Neuro Re-ed Details  with use mirror for visual feedback: standing with RW with L hand in orthosis for incr weight bearing: x10 reps lateral weight shifting from R/L cues for quad activation when weight shifting and to perform with wider BOS, weight shifting towards L and stepping RLE forwards and backwards x10 reps, stepping RLE to 2" step for weight shift/SLS on LLE x10 reps, x8 reps LLE hip/knee flexion tapping 2" step - pt fatigued easily with this exercise. min A for balance for  all activities.                     PT Short Term Goals - 01/18/20 1829      PT SHORT TERM GOAL #1   Title Pt and pt's spouse will be independent with initial HEP in order to build upon functional gains made in therapy. ALL STGS DUE 02/15/20    Time 4    Period Weeks    Status New    Target Date 02/15/20      PT SHORT TERM GOAL #2   Title Pt will perform squat pivot vs. stand pivot with supervision in order to improve functional mobility and decr caregiver burden.    Time 4    Period Weeks    Status New      PT SHORT TERM GOAL #3   Title Pt will perform sit <> stands with RW with supervision and pt able to place LUE in L hand orthosis with supervision in order to improve functional mobility.    Time 4    Period Weeks    Status New      PT SHORT TERM GOAL #4   Title Pt will ambulate at least 100' with RW and min guard in order to improve household mobility.    Time 4    Period Weeks    Status New      PT SHORT TERM GOAL #5   Title Pt will perform standing balance with single UE support at sink for 5 minutes with supervision in order to incr tolerance for ADLs.    Time 4    Period Weeks    Status New             PT Long Term Goals - 01/18/20 0941      PT LONG TERM GOAL #1   Title Pt and pt's spouse will be independent with final HEP in order to build upon functional gains made in therapy. ALL LTGS DUE 03/14/20    Time 8    Period Weeks    Status New    Target Date 03/14/20      PT LONG TERM GOAL #2   Title Pt will ambulate at least 230' with RW vs. LRAD and supervision in order to improve functional mobility.    Time 8    Period Weeks    Status New      PT LONG TERM GOAL #3   Title Pt will perform all bed mobility with mod I    Baseline 8    Period Weeks    Status New      PT LONG TERM GOAL #4   Title Pt will perform 2 steps with single HHA vs. LRAD with min guard in order to safely enter home.  Time 8    Period Weeks    Status New      PT  LONG TERM GOAL #5   Title Pt will perform all transfers with mod I in order to decr caregiver burden and improve functional mobility.    Time 8    Period Weeks    Status New                 Plan - 02/11/20 1120    Clinical Impression Statement Today's skilled session focused on gait training with RW, L AFO, and L hand orthosis - pt needing min guard and min A for balance/steering RW and making sure LUE stays in L hand orthosis. Pt demonstrating improvement in L quad activation during stance and taking consistently longer steps with RLE. Pt responded well to mirror as visual cue for weight shifting. Will continue to progress towards LTGs.    Personal Factors and Comorbidities Comorbidity 1;Past/Current Experience;Age;Profession    Comorbidities HTN    Examination-Activity Limitations Bathing;Bed Mobility;Carry;Hygiene/Grooming;Stand;Stairs;Squat;Transfers;Locomotion Level    Examination-Participation Restrictions Community Activity;Occupation   walking his dog   Stability/Clinical Decision Making Evolving/Moderate complexity    Rehab Potential Good    PT Frequency 2x / week    PT Duration --   7 weeks   PT Treatment/Interventions ADLs/Self Care Home Management;Aquatic Therapy;Electrical Stimulation;DME Instruction;Gait training;Stair training;Functional mobility training;Therapeutic activities;Therapeutic exercise;Balance training;Neuromuscular re-education;Wheelchair mobility training;Orthotic Fit/Training;Patient/family education;Passive range of motion;Energy conservation;Vestibular    PT Next Visit Plan gait training with use of RW and L hand orthosis, NMR for weightshifting through LLE, transfer training.    PT Home Exercise Plan [id number]    Consulted and Agree with Plan of Care Patient;Family member/caregiver    Family Member Consulted wife, April           Patient will benefit from skilled therapeutic intervention in order to improve the following deficits and impairments:   Abnormal gait, Decreased activity tolerance, Decreased balance, Decreased mobility, Decreased knowledge of use of DME, Decreased coordination, Decreased range of motion, Decreased strength, Difficulty walking, Impaired tone, Impaired sensation, Postural dysfunction  Visit Diagnosis: Other symptoms and signs involving the nervous system  Hemiplegia and hemiparesis following cerebral infarction affecting left non-dominant side (HCC)  Unsteadiness on feet  Other abnormalities of gait and mobility  Muscle weakness (generalized)     Problem List Patient Active Problem List   Diagnosis Date Noted   History of CVA (cerebrovascular accident) 01/19/2020   Normocytic anemia    Benign essential HTN    Hyponatremia    Acute blood loss anemia    Hypotension due to drugs    Vascular headache    Prerenal azotemia    Sleep disturbance    Leukocytosis    Transaminitis    Marijuana abuse    Essential hypertension    Dysphagia, post-stroke    Left hemiparesis ([place])    Acute respiratory failure (HCC)    ICH (intracerebral hemorrhage) ([place]) 12/06/2019    Arliss Journey, PT, DP T 02/11/2020, 11:27 AM  [facility] [address] [place], [place], [address] Phone: [phone]   Fax:  [phone]  Name: Dennis Mercado MRN: [id number] Date of Birth: March 04, 1981

## 2020-02-14 ENCOUNTER — Ambulatory Visit: Payer: BC Managed Care – PPO | Admitting: Physical Therapy

## 2020-02-14 ENCOUNTER — Other Ambulatory Visit: Payer: Self-pay

## 2020-02-14 ENCOUNTER — Ambulatory Visit: Payer: BC Managed Care – PPO

## 2020-02-14 ENCOUNTER — Ambulatory Visit: Payer: BC Managed Care – PPO | Admitting: Occupational Therapy

## 2020-02-14 DIAGNOSIS — R41841 Cognitive communication deficit: Secondary | ICD-10-CM

## 2020-02-14 DIAGNOSIS — I69354 Hemiplegia and hemiparesis following cerebral infarction affecting left non-dominant side: Secondary | ICD-10-CM

## 2020-02-14 DIAGNOSIS — R414 Neurologic neglect syndrome: Secondary | ICD-10-CM | POA: Diagnosis not present

## 2020-02-14 DIAGNOSIS — R2689 Other abnormalities of gait and mobility: Secondary | ICD-10-CM | POA: Diagnosis not present

## 2020-02-14 DIAGNOSIS — R41842 Visuospatial deficit: Secondary | ICD-10-CM | POA: Diagnosis not present

## 2020-02-14 DIAGNOSIS — M6281 Muscle weakness (generalized): Secondary | ICD-10-CM | POA: Diagnosis not present

## 2020-02-14 DIAGNOSIS — R209 Unspecified disturbances of skin sensation: Secondary | ICD-10-CM | POA: Diagnosis not present

## 2020-02-14 DIAGNOSIS — R29818 Other symptoms and signs involving the nervous system: Secondary | ICD-10-CM

## 2020-02-14 DIAGNOSIS — R2681 Unsteadiness on feet: Secondary | ICD-10-CM | POA: Diagnosis not present

## 2020-02-14 NOTE — Therapy (Signed)
[facility] [address] [place], [place], [address] Phone: [phone]   Fax:  [phone]  Speech Language Pathology Treatment  Patient Details  Name: Dennis Mercado MRN: [id number] Date of Birth: 22-Apr-1981 Referring Provider (SLP): Marlowe Shores, [place]   Encounter Date: 02/14/2020   End of Session - 02/14/20 1317    Visit Number 7    Number of Visits 25    Date for SLP Re-Evaluation 04/20/20    Authorization Type 30 visit limit ST, 30 visits combined for PT/OT    Authorization - Visit Number 7    Authorization - Number of Visits 75    SLP Start Time 0805    SLP Stop Time  0845    SLP Time Calculation (min) 40 min    Activity Tolerance Patient tolerated treatment well           No past medical history on file.  No past surgical history on file.  There were no vitals filed for this visit.          ADULT SLP TREATMENT - 02/14/20 0844      General Information   Behavior/Cognition Alert;Cooperative;Pleasant mood      Treatment Provided   Treatment provided Cognitive-Linquistic      Cognitive-Linquistic Treatment   Treatment focused on Cognition;Patient/family/caregiver education    Skilled Treatment SLP targeted higher level cognition with pt; SLP asking him about some of his career choices, planning for cars/jobs/staffing at work, goals for his career. These answers appeared to be with WNL awareness, problem solving, executive function. Pt navigated his notebook WNL. He is to plan the breakfast menu this week and bring it to therapy on WEdnesday.  Pt worked and double checked his % homework.      Assessment / Recommendations / Plan   Plan Continue with current plan of care      Progression Toward Goals   Progression toward goals Progressing toward goals              SLP Short Term Goals - 02/14/20 1319      SLP SHORT TERM GOAL #1   Title pt will initiate use of memory compensations in 3  sessions    Baseline 02-10-20, 02-14-20    Time 1    Period Weeks   or 9 sessions   Status On-going      SLP SHORT TERM GOAL #2   Title pt will use memory compensations successfully between/in 3 sessions    Baseline 02-10-20, 02-14-20    Time 1   or 13 sessions   Period Weeks    Status On-going      SLP SHORT TERM GOAL #3   Title pt will tell SLP 3 cognitive linguistic deficits in 2 sessions    Time 1   or9 sessions   Period Weeks    Status On-going      SLP SHORT TERM GOAL #4   Title pt will demonstrate error awareness in therapy tasks 100% with spontaneous re-check, in 3 sessions    Baseline 02-10-20, 02-14-20    Time 3   or 13 sessions   Period Weeks    Status On-going      SLP SHORT TERM GOAL #5   Title pt will demo selective attention in mod noisy environment for 10 minutes while completing simple-mod complex therapy task/s in 3 sessions    Time 3    Period Weeks   or 13 sessions   Status On-going  SLP Long Term Goals - 02/14/20 2256      SLP LONG TERM GOAL #1   Title pt will demonstrate WFL attention and problem solving skills 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) to prepare for return to work 100% with self-corrections in 3 sessions    Baseline 02-10-20, 02-14-20    Time 9    Period Weeks   or 25 sessions, for all LTGs   Status On-going      SLP LONG TERM GOAL #2   Title pt will utilize functional  memory compensations successfully between therapy tasks reported by pt and/or wife between 4 sessions    Time 9    Period Weeks    Status On-going      SLP LONG TERM GOAL #3   Title pt will demonstrate divided attention in two simple therapy tasks in 3 sessions    Time 5    Period Weeks    Status On-going      SLP LONG TERM GOAL #4   Title pt will use compensations for attention successfully PRN in 2 sessions    Time 9    Period Weeks    Status On-going            Plan - 02/14/20 1318    Clinical Impression Statement Pt  presents today with improving cognitive linguistic deficits in areas of attention, awareness, memory, executive function, and problem solving. See "skilled intervention" for more details for today's session. Pt's executive function and higher level skills appear to improve. Possible decr to once/week in 1-2 more visits is plausible. I recommend skilled ST to target deficit areas mentioned before, in order to return to PLOF.    Speech Therapy Frequency 2x / week    Duration --   12 weeks, or 25 total visits   Treatment/Interventions Internal/external aids;Patient/family education;Compensatory strategies;Cognitive reorganization;Cueing hierarchy;SLP instruction and feedback;Functional tasks;Environmental controls    Potential to Achieve Goals Good    Potential Considerations Cooperation/participation level   awareness          Patient will benefit from skilled therapeutic intervention in order to improve the following deficits and impairments:   Cognitive communication deficit    Problem List Patient Active Problem List   Diagnosis Date Noted   History of CVA (cerebrovascular accident) 01/19/2020   Normocytic anemia    Benign essential HTN    Hyponatremia    Acute blood loss anemia    Hypotension due to drugs    Vascular headache    Prerenal azotemia    Sleep disturbance    Leukocytosis    Transaminitis    Marijuana abuse    Essential hypertension    Dysphagia, post-stroke    Left hemiparesis (HCC)    Acute respiratory failure (HCC)    ICH (intracerebral hemorrhage) ([place]) 12/06/2019    [facility] ,[place], [place]  02/14/2020, 10:58 PM  [facility] [address] [place], [place], [address] Phone: [phone]   Fax:  [phone]   Name: Dennis Mercado MRN: [id number] Date of Birth: [birth year]

## 2020-02-14 NOTE — Therapy (Signed)
[facility] [address] [place], [place], [address] Phone: [phone]   Fax:  [phone]  Occupational Therapy Treatment  Patient Details  Name: Dennis Mercado MRN: [id number] Date of Birth: [birth year] Referring Provider (OT): Dr. Letta Pate   Encounter Date: 02/14/2020   OT End of Session - 02/14/20 0938    Visit Number 6    Number of Visits 25   POC written for 12 weeks, 2x week will need to adjust freq due to insurance   Date for OT Re-Evaluation 04/20/20    Authorization Type [employer]    Authorization Time Period 90 days- 30 VL combined for OT/ PT, will adjust frequency accordingly    Authorization - Visit Number 6    Authorization - Number of Visits 15    OT Start Time 0848    OT Stop Time 0930    OT Time Calculation (min) 42 min    Activity Tolerance Patient tolerated treatment well    Behavior During Therapy [facility] for tasks assessed/performed           No past medical history on file.  No past surgical history on file.  There were no vitals filed for this visit.   Subjective Assessment - 02/14/20 0853    Subjective  Denies pain    Patient is accompanied by: Family member   WIFE   Pertinent History Pt. presented to ED on 12/06/2019 with left side weakness and  altered mental status s/p coitus.  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 Pt required intubation 6/22 due to ARF secondary to aspiration PNA. Received inpatient rehab. Pt was discharged 7/28/2    Limitations HTN, left inattention, apraxia    Patient Stated Goals to be able to use my hand    Currently in Pain? No/denies            Wt bearing over LUE for contralateral and ipsilateral reaching w/ trunk rotation and mod tactile cueing to perform correctly and to activate LUE. Pt then did wt bearing over LUE w/ low level cross reaching RUE to high level  abduction (diagonal patterns) - pt able to do this better w/ increased activation LUE and instructed to do at home.   Closed chain LUE activation maintaining shoulder at approx 80* flexion (against therapist shoulder) - pt able to maintain for up to 5 seconds at a time. Pt repeated x 3 reps.  BUE AA/ROM in low to mid level ranges w/ pool noodle initially with min assist, then able to do w/o assist. Pt then holding ball and lowering to floor, mid level, and low diagonals w/ min assist to maintain Lt hand on ball.  UE Ranger in gravity elim plane with mod assist provided to prevent IR and adduction and to facilitate horizontal abduction.  Pt/wife encouraged to discuss medical management of spasticity with physiatrist (botox)                      OT Short Term Goals - 02/09/20 0939      OT SHORT TERM GOAL #1   Title I with inital HEP    Time 6    Period Weeks    Status Achieved      OT SHORT TERM GOAL #2   Title Pt will donn shirt with supervision and min v.c, and pants with min A    Time 6    Period Weeks  Status On-going      OT SHORT TERM GOAL #3   Title Pt will attend to items in L hemibody space with no more than min v.c    Time 6    Period Weeks    Status On-going      OT SHORT TERM GOAL #4   Title Pt will use LUE as stabilizer 10%of the time with min v.c    Time 6    Period Weeks    Status On-going      OT SHORT TERM GOAL #5   Title Pt will transfer to toilet with close supervison    Time 6    Period Weeks    Status New             OT Long Term Goals - 01/21/20 1544      OT LONG TERM GOAL #1   Title I with updated HEP    Time 12    Period Weeks    Status New      OT LONG TERM GOAL #2   Title Pt will donn shirt mod I and perfrom LB dressing with min A    Time 12    Period Weeks    Status New      OT LONG TERM GOAL #3   Title Pt. will bathe with min A    Time 12    Period Weeks    Status New      OT LONG TERM GOAL #4   Title Pt  will demonstrate 40* A/ROM shoulder flexion in prep for reach    Time 12    Period Weeks    Status New      OT LONG TERM GOAL #5   Title Pt will perform tabletop scanning activities with 75%better accuracy    Time 12    Period Weeks    Status New      OT LONG TERM GOAL #6   Title Pt will demonstrate 50% elbow flexion/ extension in LUE  for functional use.    Time 12    Period Weeks    Status New                 Plan - 02/14/20 0940    Clinical Impression Statement Pt progressed with closed chain activities and low range AA/ROM activities. Pt has more activation LUE compared to last week. Pt very motivated.    OT Occupational Profile and History Detailed Assessment- Review of Records and additional review of physical, cognitive, psychosocial history related to current functional performance    Occupational performance deficits (Please refer to evaluation for details): ADL's;IADL's;Leisure;Social Participation;Work;Play    Body Structure / Function / Physical Skills ADL;Balance;Endurance;Mobility;Strength;UE functional use;FMC;Vision;Coordination;Gait;Decreased knowledge of precautions;GMC;ROM;Decreased knowledge of use of DME;Dexterity;IADL;Sensation    Cognitive Skills Attention;Memory;Problem Solve;Safety Awareness;Thought;Understand    Rehab Potential Fair    Clinical Decision Making Limited treatment options, no task modification necessary    Comorbidities Affecting Occupational Performance: May have comorbidities impacting occupational performance    Modification or Assistance to Complete Evaluation  No modification of tasks or assist necessary to complete eval    OT Frequency 2x / week   plus eval   OT Duration 12 weeks    OT Treatment/Interventions Self-care/ADL training;Therapeutic exercise;Functional Mobility Training;Balance training;Splinting;Manual Therapy;Neuromuscular education;Aquatic Therapy;Ultrasound;Energy conservation;Therapeutic  activities;Cryotherapy;Paraffin;DME and/or AE instruction;Cognitive remediation/compensation;Visual/perceptual remediation/compensation;Gait Training;Fluidtherapy;Electrical Stimulation;Moist Heat;Contrast Bath;Passive range of motion;Patient/family education    Plan Continue NMR (consider sidelying AA/ROM, reverse tilt on stool)    Consulted  and Agree with Plan of Care Patient;Family member/caregiver           Patient will benefit from skilled therapeutic intervention in order to improve the following deficits and impairments:   Body Structure / Function / Physical Skills: ADL, Balance, Endurance, Mobility, Strength, UE functional use, FMC, Vision, Coordination, Gait, Decreased knowledge of precautions, GMC, ROM, Decreased knowledge of use of DME, Dexterity, IADL, Sensation Cognitive Skills: Attention, Memory, Problem Solve, Safety Awareness, Thought, Understand     Visit Diagnosis: Hemiplegia and hemiparesis following cerebral infarction affecting left non-dominant side (HCC)  Other symptoms and signs involving the nervous system    Problem List Patient Active Problem List   Diagnosis Date Noted  . History of CVA (cerebrovascular accident) 01/19/2020  . Normocytic anemia   . Benign essential HTN   . Hyponatremia   . Acute blood loss anemia   . Hypotension due to drugs   . Vascular headache   . Prerenal azotemia   . Sleep disturbance   . Leukocytosis   . Transaminitis   . Marijuana abuse   . Essential hypertension   . Dysphagia, post-stroke   . Left hemiparesis ([place])   . Acute respiratory failure ([place])   . ICH (intracerebral hemorrhage) ([place]) 12/06/2019    Carey Bullocks, OTR/L 02/14/2020, 9:41 AM  [facility] [address] [place], [place], [address] Phone: [phone]   Fax:  [phone]  Name: Dennis Mercado MRN: [id number] Date of Birth: [birth year]

## 2020-02-14 NOTE — Patient Instructions (Signed)
  Please complete the assigned speech therapy homework prior to your next session and return it to the speech therapist at your next visit.  

## 2020-02-15 NOTE — Progress Notes (Signed)
I agree with the above plan 

## 2020-02-16 ENCOUNTER — Ambulatory Visit: Payer: BC Managed Care – PPO

## 2020-02-16 ENCOUNTER — Encounter: Payer: BC Managed Care – PPO | Admitting: Occupational Therapy

## 2020-02-16 ENCOUNTER — Other Ambulatory Visit: Payer: Self-pay

## 2020-02-16 ENCOUNTER — Encounter: Payer: Self-pay | Admitting: Physical Therapy

## 2020-02-16 ENCOUNTER — Ambulatory Visit: Payer: BC Managed Care – PPO | Attending: Physician Assistant | Admitting: Physical Therapy

## 2020-02-16 DIAGNOSIS — M6281 Muscle weakness (generalized): Secondary | ICD-10-CM

## 2020-02-16 DIAGNOSIS — Z8673 Personal history of transient ischemic attack (TIA), and cerebral infarction without residual deficits: Secondary | ICD-10-CM | POA: Insufficient documentation

## 2020-02-16 DIAGNOSIS — R41841 Cognitive communication deficit: Secondary | ICD-10-CM | POA: Insufficient documentation

## 2020-02-16 DIAGNOSIS — R2689 Other abnormalities of gait and mobility: Secondary | ICD-10-CM | POA: Diagnosis not present

## 2020-02-16 DIAGNOSIS — R2681 Unsteadiness on feet: Secondary | ICD-10-CM | POA: Diagnosis not present

## 2020-02-16 DIAGNOSIS — R29818 Other symptoms and signs involving the nervous system: Secondary | ICD-10-CM | POA: Diagnosis not present

## 2020-02-16 DIAGNOSIS — R209 Unspecified disturbances of skin sensation: Secondary | ICD-10-CM | POA: Insufficient documentation

## 2020-02-16 DIAGNOSIS — I69354 Hemiplegia and hemiparesis following cerebral infarction affecting left non-dominant side: Secondary | ICD-10-CM | POA: Insufficient documentation

## 2020-02-16 NOTE — Therapy (Signed)
[facility] [address] [place], [place], [address] Phone: [phone]   Fax:  [phone]  Speech Language Pathology Treatment  Patient Details  Name: Dennis Mercado MRN: [id number] Date of Birth: 1980-08-03 Referring Provider (SLP): Marlowe Shores, [place]   Encounter Date: 02/16/2020   End of Session - 02/16/20 1230    Visit Number 8    Number of Visits 25    Date for SLP Re-Evaluation 04/20/20    Authorization Type 30 visit limit ST, 30 visits combined for PT/OT    Authorization - Visit Number 7    Authorization - Number of Visits 30    SLP Start Time [phone]    SLP Stop Time  1016    SLP Time Calculation (min) 40 min    Activity Tolerance Patient tolerated treatment well           History reviewed. No pertinent past medical history.  History reviewed. No pertinent surgical history.  There were no vitals filed for this visit.   Subjective Assessment - 02/16/20 0944    Subjective Pt brought in his notebook, in [place].    Patient is accompained by: Family member   April - wife   Currently in Pain? No/denies                 ADULT SLP TREATMENT - 02/16/20 0945      General Information   Behavior/Cognition Alert;Cooperative;Pleasant mood      Treatment Provided   Treatment provided Cognitive-Linquistic      Cognitive-Linquistic Treatment   Treatment focused on Cognition;Patient/family/caregiver education    Skilled Treatment Pt got out his menu for breakfast this week, spontaneously - pt made modifications to breakfast menu today due to less time this morning. 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  Pt wrote notes during session for memory. Dennis Mercado is writing and crossing off things on his weekly  planner "to-do" list, and told SLP successfully when he returns next visit. He agrees that decr to once/week is appropriate at this time.       Assessment / Recommendations / Plan   Plan Other (Comment)   decr to once a week     Progression Toward Goals   Progression toward goals Progressing toward goals              SLP Short Term Goals - 02/16/20 1232      SLP SHORT TERM GOAL #1   Title pt will initiate use of memory compensations in 3 sessions    Baseline 02-10-20, 02-14-20, 02-15-20    Period --   or 9 sessions   Status Achieved      SLP SHORT TERM GOAL #2   Title pt will use memory compensations successfully between/in 3 sessions    Baseline 02-10-20, 02-14-20, 02-16-20    Time --   or 13 sessions   Period --    Status Achieved      SLP SHORT TERM GOAL #3   Title pt will tell SLP 3 cognitive linguistic deficits in 2 sessions    Time --   or9 sessions   Status Deferred      SLP SHORT TERM GOAL #4   Title pt will demonstrate error awareness in therapy  tasks 100% with spontaneous re-check, in 3 sessions    Baseline 02-10-20, 02-14-20, 02-16-20 (breakfast menu)    Time --   or 13 sessions   Period --    Status Achieved      SLP SHORT TERM GOAL #5   Title pt will demo selective attention in mod noisy environment for 10 minutes while completing simple-mod complex therapy task/s in 3 sessions    Baseline 02-16-20    Time --    Period --   or 13 sessions   Status Partially Met            SLP Long Term Goals - 02/16/20 1239      SLP LONG TERM GOAL #1   Title pt will demonstrate WFL attention and problem solving skills 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) to prepare for return to work 100% with self-corrections in 3 sessions    Baseline 02-10-20, 02-14-20,    Time 9    Period Weeks   or 25 sessions, for all LTGs   Status On-going      SLP LONG TERM GOAL #2   Title pt will utilize functional  memory compensations successfully between therapy tasks  reported by pt and/or wife between 4 sessions    Baseline 02-16-20    Time 9    Period Weeks    Status On-going      SLP LONG TERM GOAL #3   Title pt will demonstrate divided attention in two simple therapy tasks in 3 sessions    Time 5    Period Weeks    Status On-going      SLP LONG TERM GOAL #4   Title pt will use compensations for attention successfully PRN in 2 sessions    Time 9    Period Weeks    Status On-going            Plan - 02/16/20 1230    Clinical Impression Statement Pt presents today with improving cognitive linguistic deficits in areas of attention, awareness, memory, executive function, and problem solving. Pt met or partially met 3/3 STGs (4th STG was deferred). See "skilled intervention" for more details for today's session. Pt's executive function demonstrated verbally, and higher level skills appear to cont to improve. Pt agrees decr to once/week is appropriate at this time. I recommend skilled ST to target deficit areas mentioned before, in order to return to PLOF.    Speech Therapy Frequency 2x / week    Duration --   12 weeks, or 25 total visits   Treatment/Interventions Internal/external aids;Patient/family education;Compensatory strategies;Cognitive reorganization;Cueing hierarchy;SLP instruction and feedback;Functional tasks;Environmental controls    Potential to Achieve Goals Good    Potential Considerations Cooperation/participation level   awareness          Patient will benefit from skilled therapeutic intervention in order to improve the following deficits and impairments:   Cognitive communication deficit    Problem List Patient Active Problem List   Diagnosis Date Noted  . History of CVA (cerebrovascular accident) 01/19/2020  . Normocytic anemia   . Benign essential HTN   . Hyponatremia   . Acute blood loss anemia   . Hypotension due to drugs   . Vascular headache   . Prerenal azotemia   . Sleep disturbance   . Leukocytosis   .  Transaminitis   . Marijuana abuse   . Essential hypertension   . Dysphagia, post-stroke   . Left hemiparesis ([place])   . Acute respiratory  failure ([place])   . ICH (intracerebral hemorrhage) ([place]) 12/06/2019    [facility] ,[place], [place]  02/16/2020, 12:40 PM  [facility] [address] [place], [place], [address] Phone: [phone]   Fax:  [phone]   Name: Dennis Mercado MRN: [id number] Date of Birth: April 04, 1981

## 2020-02-16 NOTE — Patient Instructions (Signed)
  Please complete the assigned speech therapy homework prior to your next session and return it to the speech therapist at your next visit.  

## 2020-02-16 NOTE — Patient Instructions (Signed)
Access Code: [email or web address] URL: [email or web address] Date: 02/16/2020 Prepared by: Janann August  Exercises Supine Bridge - 1-2 x daily - 1-2 sets - 10 reps - 5-10 sec hold Supine Heel Slide - 1-2 x daily - 1-2 sets - 10 reps Supine Short Arc Quad - 1-2 x daily - 5 x weekly - 2 sets - 5 reps Supine Hip Adduction Isometric with Ball - 1-2 x daily - 5 x weekly - 2 sets - 10 reps Supine Hamstring Stretch with Caregiver - 2 x daily - 7 x weekly - 3 sets - 30 hold Supine Ankle Dorsiflexion Stretch with Caregiver - 2 x daily - 7 x weekly - 3 sets - 30 hold Supine March with Resistance Band - 1-2 x daily - 5 x weekly - 1-2 sets - 10 reps Hooklying Single Leg Bent Knee Fallouts with Resistance - 1-2 x daily - 5 x weekly - 1-2 sets - 10 reps Seated Heel Slide - 1-2 x daily - 5 x weekly - 1-2 sets - 10 reps Sit to Stand with Counter Support - 1-2 x daily - 5 x weekly - 1 sets - 10 reps

## 2020-02-16 NOTE — Therapy (Signed)
[facility] [address] [place], [place], [address] Phone: [phone]   Fax:  [phone]  Physical Therapy Treatment  Patient Details  Name: Dennis Mercado MRN: [id number] Date of Birth: [birth year] Referring Provider (PT): Angiulli, Chapman Fitch (will be followed by Dr. Letta Pate)   Encounter Date: 02/16/2020   PT End of Session - 02/16/20 0951    Visit Number 6    Number of Visits 15    Date for PT Re-Evaluation 03/18/20    Authorization Type BCBS, VL for PT and OT = 30 (zero used, if seen on same day, will count as 2 visits), VL for ST = 30    Authorization - Visit Number 6    Authorization - Number of Visits 15    PT Start Time 0849    PT Stop Time 0933    PT Time Calculation (min) 44 min    Equipment Utilized During Treatment Gait belt    Activity Tolerance Patient tolerated treatment well    Behavior During Therapy [facility] for tasks assessed/performed           History reviewed. No pertinent past medical history.  History reviewed. No pertinent surgical history.  There were no vitals filed for this visit.   Subjective Assessment - 02/16/20 0853    Subjective No changes, has been doing more walking at home.    Pertinent History HTN    Patient Stated Goals wants to start walking as soon as possible - "less walker and downgrade to a cane and then nothing at all"    Currently in Pain? No/denies                            Access Code: [email or web address] URL: [email or web address] Date: 02/16/2020 Prepared by: Janann August  Bolded below are new additions/upgrades to pt's HEP:   Exercises Supine Bridge - 1-2 x daily - 1-2 sets - 10 reps - 5-10 sec hold Supine Heel Slide - 1-2 x daily - 1-2 sets - 10 reps Supine Short Arc Quad - 1-2 x daily - 5 x weekly - 2 sets - 5 reps Supine Hip Adduction Isometric with Ball - 1-2 x daily - 5 x weekly - 2 sets - 10 reps Supine Hamstring Stretch  with Caregiver - 2 x daily - 7 x weekly - 3 sets - 30 hold Supine Ankle Dorsiflexion Stretch with Caregiver - 2 x daily - 7 x weekly - 3 sets - 30 hold Supine March with Resistance Band - 1-2 x daily - 5 x weekly - 1-2 sets - 10 reps - attempted first with red theraband resistance with LLE x10 reps, pt reporting too easy so upgraded to green theraband x10 reps  Hooklying Single Leg Bent Knee Fallouts with Resistance - 1-2 x daily - 5 x weekly - 1-2 sets - 10 reps - with use of red theraband x10 reps, verbal and tactile cues for technique  Seated Heel Slide - 1-2 x daily - 5 x weekly - 1-2 sets - 10 reps -seated at edge of mat with brace removed with LLE shoe removed, cues for proper technique and using heel Sit to Stand with Counter Support - 1-2 x daily - 5 x weekly - 1 sets - 10 reps   [facility] Adult PT Treatment/Exercise - 02/16/20 0001      Transfers   Transfers Sit to Stand;Stand to Sit;Stand Pivot Transfers    Sit to Stand 4:  Min guard;With upper extremity assist;From bed;From chair/3-in-1    Sit to Stand Details Verbal cues for sequencing;Verbal cues for precautions/safety;Manual facilitation for weight shifting;Manual facilitation for placement;Verbal cues for safe use of DME/AE    Sit to Stand Details (indicate cue type and reason) cues for wider BOS before placing LUE in hand orthotis    Stand to Sit 4: Min guard;With upper extremity assist;To bed;To chair/3-in-1    Stand to Sit Details (indicate cue type and reason) Verbal cues for sequencing;Verbal cues for precautions/safety;Verbal cues for safe use of DME/AE;Manual facilitation for weight shifting;Manual facilitation for placement    Squat Pivot Transfers 4: Min assist    Squat Pivot Transfer Details (indicate cue type and reason) for proper LLE alignment      Ambulation/Gait   Ambulation/Gait Yes    Ambulation/Gait Assistance 4: Min assist    Ambulation/Gait Assistance Details pt demonstrating a step to pattern when ambulating around  curves, pt having a step through pattern when ambulating down a straightaway, verbal and intermittent tactile cues for L quad activation in stance, additional min A from therapist for weight bearing through LUE in hand orthosis    Ambulation Distance (Feet) 50 Feet    Assistive device Rolling walker   with L hand orthosis   Gait Pattern Step-to pattern;Decreased step length - right;Decreased stance time - left;Decreased hip/knee flexion - left;Decreased weight shift to left;Left flexed knee in stance;Narrow base of support;Poor foot clearance - left;Step-through pattern    Ambulation Surface Level;Indoor      Exercises   Exercises Other Exercises    Other Exercises  with BLE extended supine on mat table, therapist assisting with LLE hip ADD stretch 3 x 30 seconds                  PT Education - 02/16/20 0951    Education Details new additions to HEP, discussed scheduling 1x week for 4 weeks through october    Person(s) Educated Patient;Spouse    Methods Explanation;Demonstration;Handout;Verbal cues;Tactile cues    Comprehension Verbalized understanding;Returned demonstration            PT Short Term Goals - 01/18/20 5621      PT SHORT TERM GOAL #1   Title Pt and pt's spouse will be independent with initial HEP in order to build upon functional gains made in therapy. ALL STGS DUE 02/15/20    Time 4    Period Weeks    Status New    Target Date 02/15/20      PT SHORT TERM GOAL #2   Title Pt will perform squat pivot vs. stand pivot with supervision in order to improve functional mobility and decr caregiver burden.    Time 4    Period Weeks    Status New      PT SHORT TERM GOAL #3   Title Pt will perform sit <> stands with RW with supervision and pt able to place LUE in L hand orthosis with supervision in order to improve functional mobility.    Time 4    Period Weeks    Status New      PT SHORT TERM GOAL #4   Title Pt will ambulate at least 100' with RW and min guard in  order to improve household mobility.    Time 4    Period Weeks    Status New      PT SHORT TERM GOAL #5   Title Pt will perform standing balance with single UE  support at sink for 5 minutes with supervision in order to incr tolerance for ADLs.    Time 4    Period Weeks    Status New             PT Long Term Goals - 01/18/20 0941      PT LONG TERM GOAL #1   Title Pt and pt's spouse will be independent with final HEP in order to build upon functional gains made in therapy. ALL LTGS DUE 03/14/20    Time 8    Period Weeks    Status New    Target Date 03/14/20      PT LONG TERM GOAL #2   Title Pt will ambulate at least 230' with RW vs. LRAD and supervision in order to improve functional mobility.    Time 8    Period Weeks    Status New      PT LONG TERM GOAL #3   Title Pt will perform all bed mobility with mod I    Baseline 8    Period Weeks    Status New      PT LONG TERM GOAL #4   Title Pt will perform 2 steps with single HHA vs. LRAD with min guard in order to safely enter home.    Time 8    Period Weeks    Status New      PT LONG TERM GOAL #5   Title Pt will perform all transfers with mod I in order to decr caregiver burden and improve functional mobility.    Time 8    Period Weeks    Status New                 Plan - 02/16/20 8338    Clinical Impression Statement Focus of today's skilled session was upgrading and adding new exercises to pt's HEP. Able to upgrade supine marching with green resistance and added hooklying bent knee fall outs for L abd strengthening with red theraband. Also added sit <> stands to perform at countertop for incr weight bearing, and cues for pt to shift to L before sitting. Pt tolerated session well, will continue to progress towards LTGs.    Personal Factors and Comorbidities Comorbidity 1;Past/Current Experience;Age;Profession    Comorbidities HTN    Examination-Activity Limitations Bathing;Bed  Mobility;Carry;Hygiene/Grooming;Stand;Stairs;Squat;Transfers;Locomotion Level    Examination-Participation Restrictions Community Activity;Occupation   walking his dog   Stability/Clinical Decision Making Evolving/Moderate complexity    Rehab Potential Good    PT Frequency 2x / week    PT Duration --   7 weeks   PT Treatment/Interventions ADLs/Self Care Home Management;Aquatic Therapy;Electrical Stimulation;DME Instruction;Gait training;Stair training;Functional mobility training;Therapeutic activities;Therapeutic exercise;Balance training;Neuromuscular re-education;Wheelchair mobility training;Orthotic Fit/Training;Patient/family education;Passive range of motion;Energy conservation;Vestibular    PT Next Visit Plan check goals. how were new HEP additions? gait training with use of RW and L hand orthosis, NMR for weightshifting through LLE, transfer training.    PT Home Exercise Plan [email or web address]    Consulted and Agree with Plan of Care Patient;Family member/caregiver    Family Member Consulted wife, April           Patient will benefit from skilled therapeutic intervention in order to improve the following deficits and impairments:  Abnormal gait, Decreased activity tolerance, Decreased balance, Decreased mobility, Decreased knowledge of use of DME, Decreased coordination, Decreased range of motion, Decreased strength, Difficulty walking, Impaired tone, Impaired sensation, Postural dysfunction  Visit Diagnosis: Other symptoms and signs involving the nervous system  Hemiplegia and hemiparesis following cerebral infarction affecting left non-dominant side (HCC)  Unsteadiness on feet  Other abnormalities of gait and mobility  Muscle weakness (generalized)     Problem List Patient Active Problem List   Diagnosis Date Noted   History of CVA (cerebrovascular accident) 01/19/2020   Normocytic anemia    Benign essential HTN    Hyponatremia    Acute blood loss anemia    Hypotension  due to drugs    Vascular headache    Prerenal azotemia    Sleep disturbance    Leukocytosis    Transaminitis    Marijuana abuse    Essential hypertension    Dysphagia, post-stroke    Left hemiparesis ([place])    Acute respiratory failure (HCC)    ICH (intracerebral hemorrhage) ([place]) 12/06/2019    Arliss Journey, PT, DPT 02/16/2020, 9:59 AM  [facility] [address] [place], [place], [address] Phone: [phone]   Fax:  [phone]  Name: Dennis Mercado MRN: [id number] Date of Birth: 05-17-1981

## 2020-02-23 ENCOUNTER — Ambulatory Visit: Payer: BC Managed Care – PPO

## 2020-02-23 ENCOUNTER — Ambulatory Visit: Payer: BC Managed Care – PPO | Admitting: Physical Therapy

## 2020-02-23 ENCOUNTER — Ambulatory Visit: Payer: BC Managed Care – PPO | Admitting: Occupational Therapy

## 2020-02-23 ENCOUNTER — Other Ambulatory Visit: Payer: Self-pay

## 2020-02-23 DIAGNOSIS — Z8673 Personal history of transient ischemic attack (TIA), and cerebral infarction without residual deficits: Secondary | ICD-10-CM | POA: Diagnosis not present

## 2020-02-23 DIAGNOSIS — I69354 Hemiplegia and hemiparesis following cerebral infarction affecting left non-dominant side: Secondary | ICD-10-CM

## 2020-02-23 DIAGNOSIS — R208 Other disturbances of skin sensation: Secondary | ICD-10-CM

## 2020-02-23 DIAGNOSIS — R2689 Other abnormalities of gait and mobility: Secondary | ICD-10-CM | POA: Diagnosis not present

## 2020-02-23 DIAGNOSIS — R41841 Cognitive communication deficit: Secondary | ICD-10-CM

## 2020-02-23 DIAGNOSIS — R209 Unspecified disturbances of skin sensation: Secondary | ICD-10-CM | POA: Diagnosis not present

## 2020-02-23 DIAGNOSIS — R2681 Unsteadiness on feet: Secondary | ICD-10-CM | POA: Diagnosis not present

## 2020-02-23 DIAGNOSIS — M6281 Muscle weakness (generalized): Secondary | ICD-10-CM | POA: Diagnosis not present

## 2020-02-23 DIAGNOSIS — R29818 Other symptoms and signs involving the nervous system: Secondary | ICD-10-CM | POA: Diagnosis not present

## 2020-02-23 NOTE — Therapy (Signed)
[facility] [address] [place], [place], [address] Phone: [phone]   Fax:  [phone]  Occupational Therapy Treatment  Patient Details  Name: Dennis Mercado MRN: [id number] Date of Birth: 1980-09-28 Referring Provider (OT): Dr. Letta Pate   Encounter Date: 02/23/2020   OT End of Session - 02/23/20 1030    Visit Number 7    Number of Visits 25   POC written for 12 weeks, 2x week will need to adjust freq due to insurance   Date for OT Re-Evaluation 04/20/20    Authorization Type BCBS    Authorization Time Period 90 days- 30 VL combined for OT/ PT, will adjust frequency accordingly    Authorization - Visit Number 7    Authorization - Number of Visits 15    OT Start Time 0845    OT Stop Time 0930    OT Time Calculation (min) 45 min    Activity Tolerance Patient tolerated treatment well    Behavior During Therapy [facility] for tasks assessed/performed           No past medical history on file.  No past surgical history on file.  There were no vitals filed for this visit.   Subjective Assessment - 02/23/20 0851    Subjective  Denies pain    Patient is accompanied by: Family member   WIFE   Pertinent History Pt. presented to ED on 12/06/2019 with left side weakness and  altered mental status s/p coitus.  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 Pt required intubation 6/22 due to ARF secondary to aspiration PNA. Received inpatient rehab. Pt was discharged 7/28/2    Limitations HTN, left inattention, apraxia    Patient Stated Goals to be able to use my hand    Currently in Pain? No/denies           Reviewed wt bearing ex from previous session - pt required min cues to perform correctly/effectively. Low range AA/ROM w/ tilted stool in tilt and reverse tilt and mod facilitation provided LUE and to maintain hand on stool. BUE AA/ROM  w/ pool noodle sliding off lap to floor and back - pt instructed to also do this as part of HEP (to floor and along table).  Closed chain LUE activation attempting to maintain shoulder in approx 80* sh flexion and elbow extension pushing against therapist's shoulder w/ mod to max tactile and verbal cues - pt able to maintain 3-5 sec.  Sidelying: AA/ROM in scapula retraction and protraction w/ max assist for elbow extension d/t spasticity, followed by AA/ROM in sh flex/ext w/ mod assist.  Supine: self guided chest press to overhead flexion back to chest press with assist from other hand. Pt also instructed to do this as part of HEP                        OT Short Term Goals - 02/23/20 1031      OT SHORT TERM GOAL #1   Title I with inital HEP    Time 6    Period Weeks    Status Achieved      OT SHORT TERM GOAL #2   Title Pt will donn shirt with supervision and min v.c, and pants with min A    Time 6    Period Weeks    Status Achieved      OT SHORT TERM GOAL #  3   Title Pt will attend to items in L hemibody space with no more than min v.c    Time 6    Period Weeks    Status On-going      OT SHORT TERM GOAL #4   Title Pt will use LUE as stabilizer 10%of the time with min v.c    Time 6    Period Weeks    Status On-going      OT SHORT TERM GOAL #5   Title Pt will transfer to toilet with close supervison    Time 6    Period Weeks    Status Achieved             OT Long Term Goals - 01/21/20 1544      OT LONG TERM GOAL #1   Title I with updated HEP    Time 12    Period Weeks    Status New      OT LONG TERM GOAL #2   Title Pt will donn shirt mod I and perfrom LB dressing with min A    Time 12    Period Weeks    Status New      OT LONG TERM GOAL #3   Title Pt. will bathe with min A    Time 12    Period Weeks    Status New      OT LONG TERM GOAL #4   Title Pt will demonstrate 40* A/ROM shoulder flexion in prep for reach    Time 12    Period  Weeks    Status New      OT LONG TERM GOAL #5   Title Pt will perform tabletop scanning activities with 75%better accuracy    Time 12    Period Weeks    Status New      OT LONG TERM GOAL #6   Title Pt will demonstrate 50% elbow flexion/ extension in LUE  for functional use.    Time 12    Period Weeks    Status New                 Plan - 02/23/20 1031    Clinical Impression Statement Pt has met 3 STG's at this time. Pt continues to progress with low range neuro re-educ and very motivated, however limited by visit limit and frequency    OT Occupational Profile and History Detailed Assessment- Review of Records and additional review of physical, cognitive, psychosocial history related to current functional performance    Occupational performance deficits (Please refer to evaluation for details): ADL's;IADL's;Leisure;Social Participation;Work;Play    Body Structure / Function / Physical Skills ADL;Balance;Endurance;Mobility;Strength;UE functional use;FMC;Vision;Coordination;Gait;Decreased knowledge of precautions;GMC;ROM;Decreased knowledge of use of DME;Dexterity;IADL;Sensation    Cognitive Skills Attention;Memory;Problem Solve;Safety Awareness;Thought;Understand    Rehab Potential Fair    Clinical Decision Making Limited treatment options, no task modification necessary    Comorbidities Affecting Occupational Performance: May have comorbidities impacting occupational performance    Modification or Assistance to Complete Evaluation  No modification of tasks or assist necessary to complete eval    OT Frequency 2x / week   plus eval   OT Duration 12 weeks    OT Treatment/Interventions Self-care/ADL training;Therapeutic exercise;Functional Mobility Training;Balance training;Splinting;Manual Therapy;Neuromuscular education;Aquatic Therapy;Ultrasound;Energy conservation;Therapeutic activities;Cryotherapy;Paraffin;DME and/or AE instruction;Cognitive  remediation/compensation;Visual/perceptual remediation/compensation;Gait Training;Fluidtherapy;Electrical Stimulation;Moist Heat;Contrast Bath;Passive range of motion;Patient/family education    Plan Continue NMR    Consulted and Agree with Plan of Care Patient;Family member/caregiver  Patient will benefit from skilled therapeutic intervention in order to improve the following deficits and impairments:   Body Structure / Function / Physical Skills: ADL, Balance, Endurance, Mobility, Strength, UE functional use, FMC, Vision, Coordination, Gait, Decreased knowledge of precautions, GMC, ROM, Decreased knowledge of use of DME, Dexterity, IADL, Sensation Cognitive Skills: Attention, Memory, Problem Solve, Safety Awareness, Thought, Understand     Visit Diagnosis: Hemiplegia and hemiparesis following cerebral infarction affecting left non-dominant side (HCC)  Other disturbances of skin sensation    Problem List Patient Active Problem List   Diagnosis Date Noted  . History of CVA (cerebrovascular accident) 01/19/2020  . Normocytic anemia   . Benign essential HTN   . Hyponatremia   . Acute blood loss anemia   . Hypotension due to drugs   . Vascular headache   . Prerenal azotemia   . Sleep disturbance   . Leukocytosis   . Transaminitis   . Marijuana abuse   . Essential hypertension   . Dysphagia, post-stroke   . Left hemiparesis ([place])   . Acute respiratory failure ([place])   . ICH (intracerebral hemorrhage) ([place]) 12/06/2019    Carey Bullocks, OTR/L 02/23/2020, 10:33 AM  [facility] [address] [place], [place], [address] Phone: [phone]   Fax:  [phone]  Name: Teague Goynes MRN: [id number] Date of Birth: [birth year]

## 2020-02-23 NOTE — Therapy (Signed)
[facility] [address] [place], [place], [address] Phone: [phone]   Fax:  [phone]  Speech Language Pathology Treatment  Patient Details  Name: Dennis Mercado MRN: [id number] Date of Birth: 1980/12/30 Referring Provider (SLP): [place], [place]   Encounter Date: 02/23/2020   End of Session - 02/23/20 1248    Visit Number 9    Number of Visits 25    Date for SLP Re-Evaluation 04/20/20    Authorization Type 30 visit limit ST, 30 visits combined for PT/OT    Authorization - Visit Number 8    Authorization - Number of Visits 29    SLP Start Time 0805    SLP Stop Time  0092    SLP Time Calculation (min) 41 min    Activity Tolerance Patient tolerated treatment well           History reviewed. No pertinent past medical history.  History reviewed. No pertinent surgical history.  There were no vitals filed for this visit.   Subjective Assessment - 02/23/20 0819    Subjective ""I thought twice about buying another one (apple phone)."    Patient is accompained by: Family member   April - wife   Currently in Pain? No/denies                 ADULT SLP TREATMENT - 02/23/20 0820      General Information   Behavior/Cognition Alert;Cooperative;Pleasant mood      Treatment Provided   Treatment provided Cognitive-Linquistic      Cognitive-Linquistic Treatment   Treatment focused on Cognition;Patient/family/caregiver education    Skilled Treatment Pt cooked meal (tofu and veggies) over the weekend - wife and pt agreed that timing of prep and the coming together of the meal was WNL. Pt got out his summary of his iPhone solution at the [employer] for SLP - detailed and coherent. Pt wrote this out himself. SLP targeted reasoning, attention to detail and executive function in a planning activity (planning your day - ice cream cake stimuli). SLP asked pt questions interspersed throughout the task - pt answered and  demonstrated WNL alternating attention back to task. Pt organzed errands on another paper and double checked he had every errand. he then transferred errands to the schedule sheet. Pt thought he should go to the pharmacy to get a script prior to the MD appointment - when SLP explained he should consider gong after MD appointment pt stated he had gas so he could just go back. ? attention to detail/ explaining away deficits (decr'd awareness).      Assessment / Recommendations / Plan   Plan Continue with current plan of care      Progression Toward Goals   Progression toward goals Progressing toward goals            SLP Education - 02/23/20 1247    Education Details modification to task today likely would have made more sense (pharmacy stop AFTER MD appointment and not before)    Person(s) Educated Patient;Spouse    Methods Explanation            SLP Short Term Goals - 02/16/20 1232      SLP SHORT TERM GOAL #1   Title pt will initiate use of memory compensations in 3 sessions    Baseline 02-10-20, 02-14-20, 02-15-20    Period --   or 9 sessions   Status Achieved      SLP SHORT TERM GOAL #2  Title pt will use memory compensations successfully between/in 3 sessions    Baseline 02-10-20, 02-14-20, 02-16-20    Time --   or 13 sessions   Period --    Status Achieved      SLP SHORT TERM GOAL #3   Title pt will tell SLP 3 cognitive linguistic deficits in 2 sessions    Time --   or9 sessions   Status Deferred      SLP SHORT TERM GOAL #4   Title pt will demonstrate error awareness in therapy tasks 100% with spontaneous re-check, in 3 sessions    Baseline 02-10-20, 02-14-20, 02-16-20 (breakfast menu)    Time --   or 13 sessions   Period --    Status Achieved      SLP SHORT TERM GOAL #5   Title pt will demo selective attention in mod noisy environment for 10 minutes while completing simple-mod complex therapy task/s in 3 sessions    Baseline 02-16-20    Time --    Period --   or 13  sessions   Status Partially Met            SLP Long Term Goals - 02/23/20 1250      SLP LONG TERM GOAL #1   Title pt will demonstrate WFL attention and problem solving skills 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) to prepare for return to work 100% with self-corrections in 3 sessions    Baseline 02-10-20, 02-14-20,    Time 8    Period Weeks   or 25 sessions, for all LTGs   Status On-going      SLP LONG TERM GOAL #2   Title pt will utilize functional  memory compensations successfully between therapy tasks reported by pt and/or wife between 4 sessions    Baseline 02-16-20, 02-23-20    Time 8    Period Weeks    Status On-going      SLP LONG TERM GOAL #3   Title pt will demonstrate divided attention in two simple therapy tasks in 3 sessions    Time 4    Period Weeks    Status On-going      SLP LONG TERM GOAL #4   Title pt will use compensations for attention successfully PRN in 2 sessions    Time 8    Period Weeks    Status On-going            Plan - 02/23/20 1248    Clinical Impression Statement Pt presents today with improving cognitive linguistic deficits in areas of attention, awareness, memory, executive function, and problem solving. In a detailed novel task pt planning/reasoning did not appear WNL (see "skilled intervention" re: pharmacy stop). Pt made dinner over the weekend and it appears as though higher level skills appear to cont to improve. Pt agrees decr to once/week is appropriate at this time. I recommend skilled ST to target deficit areas mentioned before, in order to return to PLOF.    Speech Therapy Frequency 1x /week    Duration --   12 weeks, or 25 total visits   Treatment/Interventions Internal/external aids;Patient/family education;Compensatory strategies;Cognitive reorganization;Cueing hierarchy;SLP instruction and feedback;Functional tasks;Environmental controls    Potential to Achieve Goals Good    Potential Considerations  Cooperation/participation level   awareness          Patient will benefit from skilled therapeutic intervention in order to improve the following deficits and impairments:   Cognitive communication deficit    Problem  List Patient Active Problem List   Diagnosis Date Noted  . History of CVA (cerebrovascular accident) 01/19/2020  . Normocytic anemia   . Benign essential HTN   . Hyponatremia   . Acute blood loss anemia   . Hypotension due to drugs   . Vascular headache   . Prerenal azotemia   . Sleep disturbance   . Leukocytosis   . Transaminitis   . Marijuana abuse   . Essential hypertension   . Dysphagia, post-stroke   . Left hemiparesis ([place])   . Acute respiratory failure ([place])   . ICH (intracerebral hemorrhage) ([place]) 12/06/2019    [facility] ,[place], [place]  02/23/2020, 12:51 PM  [facility] [address] [place], [place], [address] Phone: [phone]   Fax:  [phone]   Name: Dennis Mercado MRN: [id number] Date of Birth: 1980-10-16

## 2020-02-23 NOTE — Patient Instructions (Signed)
  Please complete the assigned speech therapy homework prior to your next session and return it to the speech therapist at your next visit.  

## 2020-02-25 ENCOUNTER — Other Ambulatory Visit: Payer: Self-pay

## 2020-02-25 ENCOUNTER — Ambulatory Visit: Payer: BC Managed Care – PPO | Admitting: Physical Therapy

## 2020-02-25 ENCOUNTER — Encounter: Payer: BC Managed Care – PPO | Admitting: Occupational Therapy

## 2020-02-25 DIAGNOSIS — R41841 Cognitive communication deficit: Secondary | ICD-10-CM | POA: Diagnosis not present

## 2020-02-25 DIAGNOSIS — M6281 Muscle weakness (generalized): Secondary | ICD-10-CM | POA: Diagnosis not present

## 2020-02-25 DIAGNOSIS — R2681 Unsteadiness on feet: Secondary | ICD-10-CM

## 2020-02-25 DIAGNOSIS — R29818 Other symptoms and signs involving the nervous system: Secondary | ICD-10-CM | POA: Diagnosis not present

## 2020-02-25 DIAGNOSIS — R2689 Other abnormalities of gait and mobility: Secondary | ICD-10-CM | POA: Diagnosis not present

## 2020-02-25 DIAGNOSIS — R209 Unspecified disturbances of skin sensation: Secondary | ICD-10-CM | POA: Diagnosis not present

## 2020-02-25 DIAGNOSIS — I69354 Hemiplegia and hemiparesis following cerebral infarction affecting left non-dominant side: Secondary | ICD-10-CM | POA: Diagnosis not present

## 2020-02-25 DIAGNOSIS — Z8673 Personal history of transient ischemic attack (TIA), and cerebral infarction without residual deficits: Secondary | ICD-10-CM | POA: Diagnosis not present

## 2020-02-25 NOTE — Therapy (Signed)
[facility] [address] [place], [place], [address] Phone: [phone]   Fax:  [phone]  Physical Therapy Treatment  Patient Details  Name: Dennis Mercado MRN: [id number] Date of Birth: 09/28/1980 Referring Provider (PT): Angiulli, Chapman Fitch (will be followed by Dr. Letta Pate)   Encounter Date: 02/25/2020   PT End of Session - 02/25/20 1426    Visit Number 7    Number of Visits 15    Date for PT Re-Evaluation 03/18/20    Authorization Type BCBS, VL for PT and OT = 30 (zero used, if seen on same day, will count as 2 visits), VL for ST = 30    Authorization - Visit Number 7    Authorization - Number of Visits 15    PT Start Time 0802    PT Stop Time 0845    PT Time Calculation (min) 43 min    Equipment Utilized During Treatment Gait belt    Activity Tolerance Patient tolerated treatment well    Behavior During Therapy [facility] for tasks assessed/performed           No past medical history on file.  No past surgical history on file.  There were no vitals filed for this visit.   Subjective Assessment - 02/25/20 0805    Subjective No falls. Was barely in his wheelchair yesterday - did a lot of walking around the house.    Pertinent History HTN    Patient Stated Goals wants to start walking as soon as possible - "less walker and downgrade to a cane and then nothing at all"    Currently in Pain? No/denies                             [facility] Adult PT Treatment/Exercise - 02/25/20 0001      Transfers   Transfers Sit to Stand;Stand to Sit;Stand Pivot Transfers    Sit to Stand 4: Min guard;5: Supervision    Sit to Stand Details Verbal cues for sequencing;Verbal cues for precautions/safety;Manual facilitation for weight shifting;Manual facilitation for placement;Verbal cues for safe use of DME/AE    Sit to Stand Details (indicate cue type and reason) cues to place feet equal distance apart for more  equal weight bearing vs. having RLE staggered behind L    Stand to Sit 5: Supervision    Stand to Sit Details (indicate cue type and reason) Verbal cues for sequencing;Verbal cues for precautions/safety;Verbal cues for safe use of DME/AE;Manual facilitation for weight shifting;Manual facilitation for placement    Stand Pivot Transfers 4: Min assist    Stand Pivot Transfer Details (indicate cue type and reason) blocking at LLE and for balance, pt wearing L AFO      Ambulation/Gait   Ambulation/Gait Yes    Ambulation/Gait Assistance 4: Min guard    Ambulation/Gait Assistance Details cues for incr step length with LLE when pt is more fatigued,step to pattern when going around curves in therapy gym, tactile cues at L quad during stance, pt with improved placement on LUE in hand orthosis for weight bearing     Ambulation Distance (Feet) 110 Feet    Assistive device Rolling walker   L AFO   Gait Pattern Step-to pattern;Decreased step length - right;Decreased stance time - left;Decreased hip/knee flexion - left;Decreased weight shift to left;Left flexed knee in stance;Narrow base of support;Poor foot clearance - left;Step-through pattern    Ambulation Surface Level;Indoor  Neuro Re-ed    Neuro Re-ed Details  Standing at countertop for 5 minutes: weight shifting to R and grabbing cone from pt's spouse and then shifting to L to place on countertop 2 x 8 cones, needing min A from therapist at times for balance and cues to weight shift towards L and for L quad/glute activation when shifting weight. With RUE support at counter and w/c posteriorly: performed x5 reps mini squats with 3 second holds with verbal and demo cues for proper technique, then performed an additional x8 reps with 2" block under RLE for incr weight bearing through LLE with intermittent manual cues for weight shift to L. Standing at edge of mat table with RUE support on chair: x10 reps L hip/knee flexion tapping LLE to 4" step and then  back down to ground, then performing weight shifting towards LLE and tapping RLE to 4" step for SLS, needing min A and tactile/manual cues for weight shift x5 reps, pt with incr fatigue and end of session and had difficulty performing                   PT Education - 02/25/20 1426    Education Details progress towards goals    Person(s) Educated Patient;Spouse    Methods Explanation    Comprehension Verbalized understanding            PT Short Term Goals - 02/25/20 1427      PT SHORT TERM GOAL #1   Title Pt and pt's spouse will be independent with initial HEP in order to build upon functional gains made in therapy. ALL STGS DUE 02/15/20    Time 4    Period Weeks    Status Achieved    Target Date 02/15/20      PT SHORT TERM GOAL #2   Title Pt will perform squat pivot vs. stand pivot with supervision in order to improve functional mobility and decr caregiver burden.    Baseline squat pivot with min guard, stand pivot with min A    Time 4    Period Weeks    Status Not Met      PT SHORT TERM GOAL #3   Title Pt will perform sit <> stands with RW with supervision and pt able to place LUE in L hand orthosis with supervision in order to improve functional mobility.    Baseline supervision/min guard    Time 4    Period Weeks    Status Partially Met      PT SHORT TERM GOAL #4   Title Pt will ambulate at least 100' with RW and min guard in order to improve household mobility.    Time 4    Period Weeks    Status Achieved      PT SHORT TERM GOAL #5   Title Pt will perform standing balance with single UE support at sink for 5 minutes with supervision in order to incr tolerance for ADLs.    Baseline 5 minutes for dynamic tasks with single/no UE support at sink    Time 4    Period Weeks    Status Achieved             PT Long Term Goals - 01/18/20 0941      PT LONG TERM GOAL #1   Title Pt and pt's spouse will be independent with final HEP in order to build upon  functional gains made in therapy. ALL LTGS DUE 03/14/20    Time  8    Period Weeks    Status New    Target Date 03/14/20      PT LONG TERM GOAL #2   Title Pt will ambulate at least 230' with RW vs. LRAD and supervision in order to improve functional mobility.    Time 8    Period Weeks    Status New      PT LONG TERM GOAL #3   Title Pt will perform all bed mobility with mod I    Baseline 8    Period Weeks    Status New      PT LONG TERM GOAL #4   Title Pt will perform 2 steps with single HHA vs. LRAD with min guard in order to safely enter home.    Time 8    Period Weeks    Status New      PT LONG TERM GOAL #5   Title Pt will perform all transfers with mod I in order to decr caregiver burden and improve functional mobility.    Time 8    Period Weeks    Status New                 Plan - 02/25/20 1428    Clinical Impression Statement Pt able to stand at countertop for 5 minutes while performing dynamic balance task and weight shift with single/no UE support - needing min guard/min A for balance and cues for LLE weight shifting. Pt needing min guard for gait with RW, with pt demonstrating decr L hip/knee flexion when more fatigued. Pt fatigued easily with shifting weight towards LLE and tapping step with RLE with single UE support. Pt remains very motivated, will continue to progress towards LTGs.    Personal Factors and Comorbidities Comorbidity 1;Past/Current Experience;Age;Profession    Comorbidities HTN    Examination-Activity Limitations Bathing;Bed Mobility;Carry;Hygiene/Grooming;Stand;Stairs;Squat;Transfers;Locomotion Level    Examination-Participation Restrictions Community Activity;Occupation   walking his dog   Stability/Clinical Decision Making Evolving/Moderate complexity    Rehab Potential Good    PT Frequency 2x / week    PT Duration --   7 weeks   PT Treatment/Interventions ADLs/Self Care Home Management;Aquatic Therapy;Electrical Stimulation;DME  Instruction;Gait training;Stair training;Functional mobility training;Therapeutic activities;Therapeutic exercise;Balance training;Neuromuscular re-education;Wheelchair mobility training;Orthotic Fit/Training;Patient/family education;Passive range of motion;Energy conservation;Vestibular    PT Next Visit Plan step taps with RLE, step ups with LLE? trial gait in // bars with single UE support, gait training with use of RW and L hand orthosis, NMR for weightshifting through LLE, transfer training.    PT Home Exercise Plan [id number]    Consulted and Agree with Plan of Care Patient;Family member/caregiver    Family Member Consulted wife, April           Patient will benefit from skilled therapeutic intervention in order to improve the following deficits and impairments:  Abnormal gait, Decreased activity tolerance, Decreased balance, Decreased mobility, Decreased knowledge of use of DME, Decreased coordination, Decreased range of motion, Decreased strength, Difficulty walking, Impaired tone, Impaired sensation, Postural dysfunction  Visit Diagnosis: Hemiplegia and hemiparesis following cerebral infarction affecting left non-dominant side (HCC)  Other symptoms and signs involving the nervous system  Unsteadiness on feet  Other abnormalities of gait and mobility  Muscle weakness (generalized)     Problem List Patient Active Problem List   Diagnosis Date Noted  . History of CVA (cerebrovascular accident) 01/19/2020  . Normocytic anemia   . Benign essential HTN   . Hyponatremia   . Acute blood loss  anemia   . Hypotension due to drugs   . Vascular headache   . Prerenal azotemia   . Sleep disturbance   . Leukocytosis   . Transaminitis   . Marijuana abuse   . Essential hypertension   . Dysphagia, post-stroke   . Left hemiparesis ([place])   . Acute respiratory failure ([place])   . ICH (intracerebral hemorrhage) ([place]) 12/06/2019    Arliss Journey, PT, DPT  02/25/2020, 2:41 PM  [facility] [address] [place], [place], [address] Phone: [phone]   Fax:  [phone]  Name: Ballard Budney MRN: [id number] Date of Birth: Dec 30, 1980

## 2020-02-28 ENCOUNTER — Ambulatory Visit: Payer: BC Managed Care – PPO | Admitting: Occupational Therapy

## 2020-02-28 ENCOUNTER — Other Ambulatory Visit: Payer: Self-pay

## 2020-02-28 ENCOUNTER — Ambulatory Visit: Payer: BC Managed Care – PPO | Admitting: Physical Therapy

## 2020-02-28 DIAGNOSIS — R29818 Other symptoms and signs involving the nervous system: Secondary | ICD-10-CM

## 2020-02-28 DIAGNOSIS — M6281 Muscle weakness (generalized): Secondary | ICD-10-CM | POA: Diagnosis not present

## 2020-02-28 DIAGNOSIS — I69354 Hemiplegia and hemiparesis following cerebral infarction affecting left non-dominant side: Secondary | ICD-10-CM | POA: Diagnosis not present

## 2020-02-28 DIAGNOSIS — R2689 Other abnormalities of gait and mobility: Secondary | ICD-10-CM | POA: Diagnosis not present

## 2020-02-28 DIAGNOSIS — R2681 Unsteadiness on feet: Secondary | ICD-10-CM | POA: Diagnosis not present

## 2020-02-28 DIAGNOSIS — R209 Unspecified disturbances of skin sensation: Secondary | ICD-10-CM | POA: Diagnosis not present

## 2020-02-28 DIAGNOSIS — Z8673 Personal history of transient ischemic attack (TIA), and cerebral infarction without residual deficits: Secondary | ICD-10-CM | POA: Diagnosis not present

## 2020-02-28 DIAGNOSIS — R41841 Cognitive communication deficit: Secondary | ICD-10-CM | POA: Diagnosis not present

## 2020-02-28 NOTE — Therapy (Signed)
[facility] [address] [place], [place], [address] Phone: [phone]   Fax:  [phone]  Occupational Therapy Treatment  Patient Details  Name: Dennis Mercado MRN: [id number] Date of Birth: Oct 24, 1980 Referring Provider (OT): Dr. Letta Pate   Encounter Date: 02/28/2020   OT End of Session - 02/28/20 1344    Visit Number 8    Number of Visits 25   POC written for 12 weeks, 2x week will need to adjust freq due to insurance   Date for OT Re-Evaluation 04/20/20    Authorization Type BCBS    Authorization Time Period 90 days- 30 VL combined for OT/ PT, will adjust frequency accordingly    Authorization - Visit Number 8    Authorization - Number of Visits 15    OT Start Time 0845    OT Stop Time 0930    OT Time Calculation (min) 45 min    Activity Tolerance Patient tolerated treatment well    Behavior During Therapy [facility] for tasks assessed/performed           No past medical history on file.  No past surgical history on file.  There were no vitals filed for this visit.   Subjective Assessment - 02/28/20 0916    Patient is accompanied by: Family member   WIFE   Pertinent History Pt. presented to ED on 12/06/2019 with left side weakness and  altered mental status s/p coitus.  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 Pt required intubation 6/22 due to ARF secondary to aspiration PNA. Received inpatient rehab. Pt was discharged 7/28/2    Limitations HTN, left inattention, apraxia    Patient Stated Goals to be able to use my hand    Currently in Pain? No/denies          Pt practiced donning/doffing shirt w/ min cues to prevent turning inside out. Discussed how long sleeve shirts will be more challenging but technique still the same. Practiced donning/doffing pants over hips in standing for clothes management w/ toileting - pt  able to do with supervision.   Continue neuro re-educ for LUE seated and supine.  Supine: self ROM stretches in chest press followed by overhead flexion back to chest press. Attempted chest press w/ PVC frame but elbow ext limited d/t spasticity. Rolling over onto Lt side w/ arm abducted for wt bearing and back to supine for stretch to LUE. Seated: continued wt bearing over LUE elbow bent, followed by elbow straight. BUE AA/ROM in low range sh flexion w/ elbow extension holding foam noodle, progressed to mid range AA/ROM w/ min assist LUE                        OT Short Term Goals - 02/28/20 1345      OT SHORT TERM GOAL #1   Title I with inital HEP    Time 6    Period Weeks    Status Achieved      OT SHORT TERM GOAL #2   Title Pt will donn shirt with supervision and min v.c, and pants with min A    Time 6    Period Weeks    Status Achieved      OT SHORT TERM GOAL #3   Title Pt will attend to items in L hemibody space with no more than min v.c    Time 6  Period Weeks    Status Achieved      OT SHORT TERM GOAL #4   Title Pt will use LUE as stabilizer 10%of the time with min v.c    Time 6    Period Weeks    Status On-going      OT SHORT TERM GOAL #5   Title Pt will transfer to toilet with close supervison    Time 6    Period Weeks    Status Achieved             OT Long Term Goals - 01/21/20 1544      OT LONG TERM GOAL #1   Title I with updated HEP    Time 12    Period Weeks    Status New      OT LONG TERM GOAL #2   Title Pt will donn shirt mod I and perfrom LB dressing with min A    Time 12    Period Weeks    Status New      OT LONG TERM GOAL #3   Title Pt. will bathe with min A    Time 12    Period Weeks    Status New      OT LONG TERM GOAL #4   Title Pt will demonstrate 40* A/ROM shoulder flexion in prep for reach    Time 12    Period Weeks    Status New      OT LONG TERM GOAL #5   Title Pt will perform tabletop scanning  activities with 75%better accuracy    Time 12    Period Weeks    Status New      OT LONG TERM GOAL #6   Title Pt will demonstrate 50% elbow flexion/ extension in LUE  for functional use.    Time 12    Period Weeks    Status New                 Plan - 02/28/20 1346    Clinical Impression Statement Pt has met 4/5 STG's at this time. Pt limited LUE by spasticity and will discuss medical management with physiatrist next week    OT Occupational Profile and History Detailed Assessment- Review of Records and additional review of physical, cognitive, psychosocial history related to current functional performance    Occupational performance deficits (Please refer to evaluation for details): ADL's;IADL's;Leisure;Social Participation;Work;Play    Body Structure / Function / Physical Skills ADL;Balance;Endurance;Mobility;Strength;UE functional use;FMC;Vision;Coordination;Gait;Decreased knowledge of precautions;GMC;ROM;Decreased knowledge of use of DME;Dexterity;IADL;Sensation    Cognitive Skills Attention;Memory;Problem Solve;Safety Awareness;Thought;Understand    Rehab Potential Fair    Clinical Decision Making Limited treatment options, no task modification necessary    Comorbidities Affecting Occupational Performance: May have comorbidities impacting occupational performance    Modification or Assistance to Complete Evaluation  No modification of tasks or assist necessary to complete eval    OT Frequency 2x / week   plus eval   OT Duration 12 weeks    OT Treatment/Interventions Self-care/ADL training;Therapeutic exercise;Functional Mobility Training;Balance training;Splinting;Manual Therapy;Neuromuscular education;Aquatic Therapy;Ultrasound;Energy conservation;Therapeutic activities;Cryotherapy;Paraffin;DME and/or AE instruction;Cognitive remediation/compensation;Visual/perceptual remediation/compensation;Gait Training;Fluidtherapy;Electrical Stimulation;Moist Heat;Contrast Bath;Passive range  of motion;Patient/family education    Plan Continue NMR LUE/Trunk    Consulted and Agree with Plan of Care Patient;Family member/caregiver           Patient will benefit from skilled therapeutic intervention in order to improve the following deficits and impairments:   Body Structure / Function / Physical Skills: ADL, Balance,  Endurance, Mobility, Strength, UE functional use, FMC, Vision, Coordination, Gait, Decreased knowledge of precautions, GMC, ROM, Decreased knowledge of use of DME, Dexterity, IADL, Sensation Cognitive Skills: Attention, Memory, Problem Solve, Safety Awareness, Thought, Understand     Visit Diagnosis: Hemiplegia and hemiparesis following cerebral infarction affecting left non-dominant side (HCC)  Other symptoms and signs involving the nervous system    Problem List Patient Active Problem List   Diagnosis Date Noted  . History of CVA (cerebrovascular accident) 01/19/2020  . Normocytic anemia   . Benign essential HTN   . Hyponatremia   . Acute blood loss anemia   . Hypotension due to drugs   . Vascular headache   . Prerenal azotemia   . Sleep disturbance   . Leukocytosis   . Transaminitis   . Marijuana abuse   . Essential hypertension   . Dysphagia, post-stroke   . Left hemiparesis ([place])   . Acute respiratory failure ([place])   . ICH (intracerebral hemorrhage) ([place]) 12/06/2019    Carey Bullocks, OTR/L 02/28/2020, 1:48 PM  [facility] [address] [place], [place], [address] Phone: [phone]   Fax:  [phone]  Name: Demontez Novack MRN: [id number] Date of Birth: Oct 06, 1980

## 2020-03-01 ENCOUNTER — Encounter: Payer: BC Managed Care – PPO | Admitting: Occupational Therapy

## 2020-03-01 ENCOUNTER — Ambulatory Visit: Payer: BC Managed Care – PPO

## 2020-03-01 ENCOUNTER — Ambulatory Visit: Payer: BC Managed Care – PPO | Admitting: Physical Therapy

## 2020-03-01 ENCOUNTER — Other Ambulatory Visit: Payer: Self-pay

## 2020-03-01 DIAGNOSIS — R2689 Other abnormalities of gait and mobility: Secondary | ICD-10-CM | POA: Diagnosis not present

## 2020-03-01 DIAGNOSIS — I69354 Hemiplegia and hemiparesis following cerebral infarction affecting left non-dominant side: Secondary | ICD-10-CM

## 2020-03-01 DIAGNOSIS — R2681 Unsteadiness on feet: Secondary | ICD-10-CM

## 2020-03-01 DIAGNOSIS — M6281 Muscle weakness (generalized): Secondary | ICD-10-CM | POA: Diagnosis not present

## 2020-03-01 DIAGNOSIS — R29818 Other symptoms and signs involving the nervous system: Secondary | ICD-10-CM | POA: Diagnosis not present

## 2020-03-01 DIAGNOSIS — R41841 Cognitive communication deficit: Secondary | ICD-10-CM | POA: Diagnosis not present

## 2020-03-01 DIAGNOSIS — R209 Unspecified disturbances of skin sensation: Secondary | ICD-10-CM | POA: Diagnosis not present

## 2020-03-01 DIAGNOSIS — Z8673 Personal history of transient ischemic attack (TIA), and cerebral infarction without residual deficits: Secondary | ICD-10-CM | POA: Diagnosis not present

## 2020-03-01 NOTE — Therapy (Signed)
[facility] [address] [place], [place], [address] Phone: [phone]   Fax:  [phone]  Physical Therapy Treatment  Patient Details  Name: Dennis Mercado MRN: [id number] Date of Birth: 1981-02-23 Referring Provider (PT): Angiulli, Chapman Fitch (will be followed by Dr. Letta Pate)   Encounter Date: 03/01/2020   PT End of Session - 03/01/20 1135    Visit Number 8    Number of Visits 15    Date for PT Re-Evaluation 03/18/20    Authorization Type BCBS, VL for PT and OT = 30 (zero used, if seen on same day, will count as 2 visits), VL for ST = 30    Authorization - Visit Number 8    Authorization - Number of Visits 15    PT Start Time 0933    PT Stop Time 1017    PT Time Calculation (min) 44 min    Equipment Utilized During Treatment Gait belt    Activity Tolerance Patient tolerated treatment well    Behavior During Therapy [facility] for tasks assessed/performed           No past medical history on file.  No past surgical history on file.  There were no vitals filed for this visit.   Subjective Assessment - 03/01/20 0942    Subjective Doing a lot of walking at home. Is showering by himself at home.    Pertinent History HTN    Patient Stated Goals wants to start walking as soon as possible - "less walker and downgrade to a cane and then nothing at all"    Currently in Pain? No/denies                             [facility] Adult PT Treatment/Exercise - 03/01/20 0001      Transfers   Transfers Sit to Stand;Stand to Sit;Stand Pivot Transfers    Sit to Stand 4: Min guard;5: Supervision    Sit to Stand Details Verbal cues for sequencing;Verbal cues for precautions/safety;Manual facilitation for weight shifting;Manual facilitation for placement;Verbal cues for safe use of DME/AE    Sit to Stand Details (indicate cue type and reason) initial verbal cues to perform with equal distance between BLE vs. RLE more  staggered in order for equal weight bearing    Stand to Sit 5: Supervision    Stand to Sit Details (indicate cue type and reason) Verbal cues for sequencing;Verbal cues for precautions/safety;Verbal cues for safe use of DME/AE;Manual facilitation for weight shifting;Manual facilitation for placement    Comments massed practice sit <> stands throughout session: from elevated mat table, initial cues for keeping weight through LLE when standing, initially pt's LLE "floating" off ground when standing, remaining reps pt with improved with bearing through LLE, progressed to performing with 2" block under RLE for incr weight bearing/shifting, cues for eccentric control when lowering       Ambulation/Gait   Ambulation/Gait Yes    Ambulation/Gait Assistance 4: Min guard;5: Supervision    Ambulation/Gait Assistance Details intermittent cues for incr R step length for incr stance time with LLE, when more fatigued, therapist assisting with weight shift towards L and to prevent posterior pelvic rotation. practiced gait outside with RW over paved surfaces and descending down slight incline with intermittent min guard. cleared PT to begin to ambulate into therapy session with RW while wearing gait belt and with wife present     Ambulation Distance (Feet) 95 Feet  x1, 200 x 1, 50 x 1   Assistive device Rolling walker   L AFO, L hand orthosis   Gait Pattern Decreased step length - right;Decreased stance time - left;Decreased hip/knee flexion - left;Decreased weight shift to left;Left flexed knee in stance;Narrow base of support;Poor foot clearance - left;Step-through pattern    Ambulation Surface Level;Indoor;Outdoor;Paved    Gait Comments practiced getting in and out of car with RW - cued pt to perform as if he was turning to sit on the mat table, pt needing min guard/supervision, cues to fully back up to car and feeling car on BLE prior to sitting       Neuro Re-ed    Neuro Re-ed Details  standing with BUE support  in RW: x10 reps weight shifting towards L and tapping 4" step with RLE, then progressing to single RUE support on chair and performed same activity x10 reps - verbal and tactile cues for L quad and glute activation during stance, min guard/min A for balance. With RUE support on chair anteriorly, lateral stepping over 2" block with RLE for weight bearing and SLS through LLE and closed chain hip ABD strengthening x15 reps, min A/min guard for balance/weight shift.                   PT Education - 03/01/20 1135    Education Details car transfers, ambulating into clinic with RW    Person(s) Educated Patient;Spouse    Methods Explanation;Demonstration    Comprehension Verbalized understanding            PT Short Term Goals - 02/25/20 1427      PT SHORT TERM GOAL #1   Title Pt and pt's spouse will be independent with initial HEP in order to build upon functional gains made in therapy. ALL STGS DUE 02/15/20    Time 4    Period Weeks    Status Achieved    Target Date 02/15/20      PT SHORT TERM GOAL #2   Title Pt will perform squat pivot vs. stand pivot with supervision in order to improve functional mobility and decr caregiver burden.    Baseline squat pivot with min guard, stand pivot with min A    Time 4    Period Weeks    Status Not Met      PT SHORT TERM GOAL #3   Title Pt will perform sit <> stands with RW with supervision and pt able to place LUE in L hand orthosis with supervision in order to improve functional mobility.    Baseline supervision/min guard    Time 4    Period Weeks    Status Partially Met      PT SHORT TERM GOAL #4   Title Pt will ambulate at least 100' with RW and min guard in order to improve household mobility.    Time 4    Period Weeks    Status Achieved      PT SHORT TERM GOAL #5   Title Pt will perform standing balance with single UE support at sink for 5 minutes with supervision in order to incr tolerance for ADLs.    Baseline 5 minutes for  dynamic tasks with single/no UE support at sink    Time 4    Period Weeks    Status Achieved             PT Long Term Goals - 01/18/20 0941      PT LONG  TERM GOAL #1   Title Pt and pt's spouse will be independent with final HEP in order to build upon functional gains made in therapy. ALL LTGS DUE 03/14/20    Time 8    Period Weeks    Status New    Target Date 03/14/20      PT LONG TERM GOAL #2   Title Pt will ambulate at least 230' with RW vs. LRAD and supervision in order to improve functional mobility.    Time 8    Period Weeks    Status New      PT LONG TERM GOAL #3   Title Pt will perform all bed mobility with mod I    Baseline 8    Period Weeks    Status New      PT LONG TERM GOAL #4   Title Pt will perform 2 steps with single HHA vs. LRAD with min guard in order to safely enter home.    Time 8    Period Weeks    Status New      PT LONG TERM GOAL #5   Title Pt will perform all transfers with mod I in order to decr caregiver burden and improve functional mobility.    Time 8    Period Weeks    Status New                 Plan - 03/01/20 1137    Clinical Impression Statement Today's skilled session was gait training with RW - progressing distance and ambulating over outdoor unevel surfaces and NMR for weight shifting towards LLE. Practiced car transfers with RW with pt able to perform with supervision/min guard. Cleared pt to ambulate into clinic with RW with supervision from wife, both verbalized understanding. Pt demonstrating improved BLE strength and balance - able to perform sit <> stands with no UE support with min guard and with improved weight bearing through LLE. Will continue to progress towards LTGs.    Personal Factors and Comorbidities Comorbidity 1;Past/Current Experience;Age;Profession    Comorbidities HTN    Examination-Activity Limitations Bathing;Bed Mobility;Carry;Hygiene/Grooming;Stand;Stairs;Squat;Transfers;Locomotion Level     Examination-Participation Restrictions Community Activity;Occupation   walking his dog   Stability/Clinical Decision Making Evolving/Moderate complexity    Rehab Potential Good    PT Frequency 2x / week    PT Duration --   7 weeks   PT Treatment/Interventions ADLs/Self Care Home Management;Aquatic Therapy;Electrical Stimulation;DME Instruction;Gait training;Stair training;Functional mobility training;Therapeutic activities;Therapeutic exercise;Balance training;Neuromuscular re-education;Wheelchair mobility training;Orthotic Fit/Training;Patient/family education;Passive range of motion;Energy conservation;Vestibular    PT Next Visit Plan assess hip flexor ROM. standing balance with no UE support, tall kneeling?, step ups with LLE, NMR for weight shifting through LLE.    PT Home Exercise Plan [id number]    Consulted and Agree with Plan of Care Patient;Family member/caregiver    Family Member Consulted wife, April           Patient will benefit from skilled therapeutic intervention in order to improve the following deficits and impairments:  Abnormal gait, Decreased activity tolerance, Decreased balance, Decreased mobility, Decreased knowledge of use of DME, Decreased coordination, Decreased range of motion, Decreased strength, Difficulty walking, Impaired tone, Impaired sensation, Postural dysfunction  Visit Diagnosis: Other symptoms and signs involving the nervous system  Hemiplegia and hemiparesis following cerebral infarction affecting left non-dominant side (HCC)  Unsteadiness on feet  Other abnormalities of gait and mobility  Muscle weakness (generalized)     Problem List Patient Active Problem List   Diagnosis Date  Noted  . History of CVA (cerebrovascular accident) 01/19/2020  . Normocytic anemia   . Benign essential HTN   . Hyponatremia   . Acute blood loss anemia   . Hypotension due to drugs   . Vascular headache   . Prerenal azotemia   . Sleep disturbance   .  Leukocytosis   . Transaminitis   . Marijuana abuse   . Essential hypertension   . Dysphagia, post-stroke   . Left hemiparesis ([place])   . Acute respiratory failure ([place])   . ICH (intracerebral hemorrhage) ([place]) 12/06/2019    Arliss Journey, PT, DPT  03/01/2020, 11:58 AM  [facility] [address] [place], [place], [address] Phone: [phone]   Fax:  [phone]  Name: Dennis Mercado MRN: [id number] Date of Birth: 1980/08/07

## 2020-03-01 NOTE — Therapy (Signed)
[facility] [address] [place], [place], [address] Phone: [phone]   Fax:  [phone]  Speech Language Pathology Treatment  Patient Details  Name: Dennis Mercado MRN: [id number] Date of Birth: [birth year] Referring Provider (SLP): [place], [place]   Encounter Date: 03/01/2020   End of Session - 03/01/20 1132    Visit Number 10    Number of Visits 25    Date for SLP Re-Evaluation 04/20/20    Authorization Type 30 visit limit ST, 30 visits combined for PT/OT    Authorization - Visit Number 9    Authorization - Number of Visits 30    SLP Start Time 0848    SLP Stop Time  9326    SLP Time Calculation (min) 36 min    Activity Tolerance Patient tolerated treatment well           History reviewed. No pertinent past medical history.  History reviewed. No pertinent surgical history.  There were no vitals filed for this visit.   Subjective Assessment - 03/01/20 0855    Subjective "I got on the grill Saturday."    Patient is accompained by: Family member   April - wife   Currently in Pain? No/denies                 ADULT SLP TREATMENT - 03/01/20 0856      General Information   Behavior/Cognition Alert;Cooperative;Pleasant mood      Treatment Provided   Treatment provided Cognitive-Linquistic      Cognitive-Linquistic Treatment   Treatment focused on Cognition;Patient/family/caregiver education    Skilled Treatment Pt cooked pork for he and wife on grill - marintated it for two hours. SLP reviewed pt's homework with him (organizing your day) pt forgot buying postage stamps and said he could do that with the grocery store. Pt does not report noticing anything different/more difficult in the past week than prior to hospitalization. Pt to plan a 3-day weekend to a place they have not been before, food, lodging, sites. Wife to go back to work next week - pt stated he is thinking through how to let the dog  out, what he can have for meals. His idea appeared to be feasible (upon SLP inquiring his plan). Pt's plan is to do simple meals - cereal, sandwiches, etc.       Assessment / Recommendations / [place] with current plan of care      Progression Toward Goals   Progression toward goals Progressing toward goals              SLP Short Term Goals - 03/01/20 1143      SLP SHORT TERM GOAL #1   Title pt will initiate use of memory compensations in 3 sessions    Baseline 02-10-20, 02-14-20, 02-15-20    Period --   or 9 sessions   Status Achieved      SLP SHORT TERM GOAL #2   Title pt will use memory compensations successfully between/in 3 sessions    Baseline 02-10-20, 02-14-20, 02-16-20    Time --   or 13 sessions   Status Achieved      SLP SHORT TERM GOAL #3   Title pt will tell SLP 3 cognitive linguistic deficits in 2 sessions    Time --   or9 sessions   Status Deferred      SLP SHORT TERM GOAL #4   Title pt will demonstrate error awareness in therapy  tasks 100% with spontaneous re-check, in 3 sessions    Baseline 02-10-20, 02-14-20, 02-16-20 (breakfast menu)    Time --   or 13 sessions   Status Achieved      SLP SHORT TERM GOAL #5   Title pt will demo selective attention in mod noisy environment for 10 minutes while completing simple-mod complex therapy task/s in 3 sessions    Baseline 02-16-20    Period --   or 13 sessions   Status Partially Met            SLP Long Term Goals - 03/01/20 1143      SLP LONG TERM GOAL #1   Title pt will demonstrate WFL attention and problem solving skills 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) to prepare for return to work 100% with self-corrections in 3 sessions    Baseline 02-10-20, 02-14-20,    Time 7    Period Weeks   or 25 sessions, for all LTGs   Status On-going      SLP LONG TERM GOAL #2   Title pt will utilize functional  memory compensations successfully between therapy tasks reported by pt and/or wife  between 4 sessions    Baseline 02-16-20, 02-23-20, 03-01-20    Time 7    Period Weeks    Status On-going      SLP LONG TERM GOAL #3   Title pt will demonstrate divided attention in two simple therapy tasks in 3 sessions    Time 3    Period Weeks    Status On-going      SLP LONG TERM GOAL #4   Title pt will use compensations for attention successfully PRN in 2 sessions    Time 7    Period Weeks    Status On-going            Plan - 03/01/20 1141    Clinical Impression Statement Pt presents today with improving cognitive linguistic deficits in areas of attention, awareness, memory, executive function, and problem solving. In a detailed novel task pt missed one detail (see "skilled intervention" re: buying stamps). Pt cont to perform cooking at home, without reported errors/difficulties by pt or wife. Pt agrees decr to once/week is appropriate at this time. I recommend skilled ST to target deficit areas mentioned before, in order to return to PLOF.    Speech Therapy Frequency 1x /week    Duration --   12 weeks, or 25 total visits   Treatment/Interventions Internal/external aids;Patient/family education;Compensatory strategies;Cognitive reorganization;Cueing hierarchy;SLP instruction and feedback;Functional tasks;Environmental controls    Potential to Achieve Goals Good    Potential Considerations Cooperation/participation level   awareness          Patient will benefit from skilled therapeutic intervention in order to improve the following deficits and impairments:   Cognitive communication deficit    Problem List Patient Active Problem List   Diagnosis Date Noted  . History of CVA (cerebrovascular accident) 01/19/2020  . Normocytic anemia   . Benign essential HTN   . Hyponatremia   . Acute blood loss anemia   . Hypotension due to drugs   . Vascular headache   . Prerenal azotemia   . Sleep disturbance   . Leukocytosis   . Transaminitis   . Marijuana abuse   . Essential  hypertension   . Dysphagia, post-stroke   . Left hemiparesis ([place])   . Acute respiratory failure ([place])   . ICH (intracerebral hemorrhage) ([place]) 12/06/2019    Barron Vanloan ,  MS, CCC-SLP  03/01/2020, 11:44 AM  [facility] [address] [place], [place], [address] Phone: [phone]   Fax:  [phone]   Name: Ladavion Savitz MRN: [id number] Date of Birth: 1981/01/22

## 2020-03-06 ENCOUNTER — Ambulatory Visit: Payer: BC Managed Care – PPO | Admitting: Occupational Therapy

## 2020-03-06 ENCOUNTER — Other Ambulatory Visit: Payer: Self-pay

## 2020-03-06 ENCOUNTER — Ambulatory Visit: Payer: BC Managed Care – PPO | Admitting: Physical Therapy

## 2020-03-06 DIAGNOSIS — R209 Unspecified disturbances of skin sensation: Secondary | ICD-10-CM | POA: Diagnosis not present

## 2020-03-06 DIAGNOSIS — R2681 Unsteadiness on feet: Secondary | ICD-10-CM

## 2020-03-06 DIAGNOSIS — R29818 Other symptoms and signs involving the nervous system: Secondary | ICD-10-CM | POA: Diagnosis not present

## 2020-03-06 DIAGNOSIS — I69354 Hemiplegia and hemiparesis following cerebral infarction affecting left non-dominant side: Secondary | ICD-10-CM | POA: Diagnosis not present

## 2020-03-06 DIAGNOSIS — M6281 Muscle weakness (generalized): Secondary | ICD-10-CM | POA: Diagnosis not present

## 2020-03-06 DIAGNOSIS — R2689 Other abnormalities of gait and mobility: Secondary | ICD-10-CM | POA: Diagnosis not present

## 2020-03-06 DIAGNOSIS — R41841 Cognitive communication deficit: Secondary | ICD-10-CM | POA: Diagnosis not present

## 2020-03-06 DIAGNOSIS — Z8673 Personal history of transient ischemic attack (TIA), and cerebral infarction without residual deficits: Secondary | ICD-10-CM | POA: Diagnosis not present

## 2020-03-06 NOTE — Therapy (Signed)
[facility] [address] [place], [place], [address] Phone: [phone]   Fax:  [phone]  Occupational Therapy Treatment  Patient Details  Name: Dennis Mercado MRN: [id number] Date of Birth: May 13, 1981 Referring Provider (OT): Dr. Letta Pate   Encounter Date: 03/06/2020   OT End of Session - 03/06/20 1020    Visit Number 9    Number of Visits 25   POC written for 12 weeks, 2x week will need to adjust freq due to insurance   Date for OT Re-Evaluation 04/20/20    Authorization Type BCBS    Authorization Time Period 90 days- 30 VL combined for OT/ PT, will adjust frequency accordingly    Authorization - Visit Number 9    Authorization - Number of Visits 15    OT Start Time 0845    OT Stop Time 0930    OT Time Calculation (min) 45 min    Activity Tolerance Patient tolerated treatment well    Behavior During Therapy [facility] for tasks assessed/performed           No past medical history on file.  No past surgical history on file.  There were no vitals filed for this visit.   Subjective Assessment - 03/06/20 0851    Subjective  Dressing is getting easier.    Patient is accompanied by: Family member   WIFE   Pertinent History Pt. presented to ED on 12/06/2019 with left side weakness and  altered mental status s/p coitus.  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 Pt required intubation 6/22 due to ARF secondary to aspiration PNA. Received inpatient rehab. Pt was discharged 7/28/2    Limitations HTN, left inattention, apraxia    Patient Stated Goals to be able to use my hand    Currently in Pain? No/denies           Pt performing AA/ROM BUE's along ball in standing then along sliding board both in gravity elim plane and slightly elevated for inclined surface.  Wt bearing over BUE's for sit to squat activities (using chair placed  in front) followed by wt shifts side to side and A/P. Wt bearing over BUE's followed by disengaging RUE to increase weight over LUE - focus on transitional movements with equal wt distribution on Lt side.  Worked on scapula depression and pushing down through LUE to come up to squat position. Adjusted walker height for better positioning                       OT Short Term Goals - 02/28/20 1345      OT SHORT TERM GOAL #1   Title I with inital HEP    Time 6    Period Weeks    Status Achieved      OT SHORT TERM GOAL #2   Title Pt will donn shirt with supervision and min v.c, and pants with min A    Time 6    Period Weeks    Status Achieved      OT SHORT TERM GOAL #3   Title Pt will attend to items in L hemibody space with no more than min v.c    Time 6    Period Weeks    Status Achieved      OT SHORT TERM GOAL #4   Title Pt will use LUE as stabilizer 10%of the time with min v.c  Time 6    Period Weeks    Status On-going      OT SHORT TERM GOAL #5   Title Pt will transfer to toilet with close supervison    Time 6    Period Weeks    Status Achieved             OT Long Term Goals - 03/06/20 1020      OT LONG TERM GOAL #1   Title I with updated HEP    Time 12    Period Weeks    Status New      OT LONG TERM GOAL #2   Title Pt will donn shirt mod I and perfrom LB dressing with min A    Time 12    Period Weeks    Status Achieved      OT LONG TERM GOAL #3   Title Pt. will bathe with min A    Time 12    Period Weeks    Status Achieved   distant supervision     OT LONG TERM GOAL #4   Title Pt will demonstrate 40* A/ROM shoulder flexion in prep for reach    Time 12    Period Weeks    Status New      OT LONG TERM GOAL #5   Title Pt will perform tabletop scanning activities with 75%better accuracy    Time 12    Period Weeks    Status New      OT LONG TERM GOAL #6   Title Pt will demonstrate 50% elbow flexion/ extension in LUE  for  functional use.    Time 12    Period Weeks    Status New                 Plan - 03/06/20 1021    Clinical Impression Statement Pt with decreased spasticity LUE after wt bearing activities. Pt met LTG #2 and #3.    OT Occupational Profile and History Detailed Assessment- Review of Records and additional review of physical, cognitive, psychosocial history related to current functional performance    Occupational performance deficits (Please refer to evaluation for details): ADL's;IADL's;Leisure;Social Participation;Work;Play    Body Structure / Function / Physical Skills ADL;Balance;Endurance;Mobility;Strength;UE functional use;FMC;Vision;Coordination;Gait;Decreased knowledge of precautions;GMC;ROM;Decreased knowledge of use of DME;Dexterity;IADL;Sensation    Cognitive Skills Attention;Memory;Problem Solve;Safety Awareness;Thought;Understand    Rehab Potential Fair    Clinical Decision Making Limited treatment options, no task modification necessary    Comorbidities Affecting Occupational Performance: May have comorbidities impacting occupational performance    Modification or Assistance to Complete Evaluation  No modification of tasks or assist necessary to complete eval    OT Frequency 2x / week   plus eval   OT Duration 12 weeks    OT Treatment/Interventions Self-care/ADL training;Therapeutic exercise;Functional Mobility Training;Balance training;Splinting;Manual Therapy;Neuromuscular education;Aquatic Therapy;Ultrasound;Energy conservation;Therapeutic activities;Cryotherapy;Paraffin;DME and/or AE instruction;Cognitive remediation/compensation;Visual/perceptual remediation/compensation;Gait Training;Fluidtherapy;Electrical Stimulation;Moist Heat;Contrast Bath;Passive range of motion;Patient/family education    Plan Continue NMR LUE/Trunk    Consulted and Agree with Plan of Care Patient;Family member/caregiver           Patient will benefit from skilled therapeutic intervention in  order to improve the following deficits and impairments:   Body Structure / Function / Physical Skills: ADL, Balance, Endurance, Mobility, Strength, UE functional use, FMC, Vision, Coordination, Gait, Decreased knowledge of precautions, GMC, ROM, Decreased knowledge of use of DME, Dexterity, IADL, Sensation Cognitive Skills: Attention, Memory, Problem Solve, Safety Awareness, Thought, Understand  Visit Diagnosis: Hemiplegia and hemiparesis following cerebral infarction affecting left non-dominant side ([place])  Unsteadiness on feet    Problem List Patient Active Problem List   Diagnosis Date Noted  . History of CVA (cerebrovascular accident) 01/19/2020  . Normocytic anemia   . Benign essential HTN   . Hyponatremia   . Acute blood loss anemia   . Hypotension due to drugs   . Vascular headache   . Prerenal azotemia   . Sleep disturbance   . Leukocytosis   . Transaminitis   . Marijuana abuse   . Essential hypertension   . Dysphagia, post-stroke   . Left hemiparesis ([place])   . Acute respiratory failure ([place])   . ICH (intracerebral hemorrhage) ([place]) 12/06/2019    Carey Bullocks, OTR/L 03/06/2020, 10:22 AM  [facility] [address] [place], [place], [address] Phone: [phone]   Fax:  [phone]  Name: Dennis Mercado MRN: [id number] Date of Birth: Jun 05, 1981

## 2020-03-08 ENCOUNTER — Other Ambulatory Visit: Payer: Self-pay

## 2020-03-08 ENCOUNTER — Encounter: Payer: BC Managed Care – PPO | Admitting: Occupational Therapy

## 2020-03-08 ENCOUNTER — Ambulatory Visit: Payer: BC Managed Care – PPO

## 2020-03-08 ENCOUNTER — Ambulatory Visit: Payer: BC Managed Care – PPO | Admitting: Physical Therapy

## 2020-03-08 DIAGNOSIS — R2681 Unsteadiness on feet: Secondary | ICD-10-CM | POA: Diagnosis not present

## 2020-03-08 DIAGNOSIS — M6281 Muscle weakness (generalized): Secondary | ICD-10-CM | POA: Diagnosis not present

## 2020-03-08 DIAGNOSIS — R209 Unspecified disturbances of skin sensation: Secondary | ICD-10-CM | POA: Diagnosis not present

## 2020-03-08 DIAGNOSIS — R41841 Cognitive communication deficit: Secondary | ICD-10-CM | POA: Diagnosis not present

## 2020-03-08 DIAGNOSIS — R29818 Other symptoms and signs involving the nervous system: Secondary | ICD-10-CM

## 2020-03-08 DIAGNOSIS — R2689 Other abnormalities of gait and mobility: Secondary | ICD-10-CM | POA: Diagnosis not present

## 2020-03-08 DIAGNOSIS — I69354 Hemiplegia and hemiparesis following cerebral infarction affecting left non-dominant side: Secondary | ICD-10-CM | POA: Diagnosis not present

## 2020-03-08 DIAGNOSIS — Z8673 Personal history of transient ischemic attack (TIA), and cerebral infarction without residual deficits: Secondary | ICD-10-CM | POA: Diagnosis not present

## 2020-03-08 DIAGNOSIS — R208 Other disturbances of skin sensation: Secondary | ICD-10-CM

## 2020-03-08 NOTE — Therapy (Signed)
[facility] [address] [place], [place], [address] Phone: [phone]   Fax:  [phone]  Speech Language Pathology Treatment  Patient Details  Name: Dennis Mercado MRN: [id number] Date of Birth: [birth year] Referring Provider (SLP): [place], [place]   Encounter Date: 03/08/2020   End of Session - 03/08/20 1405    Visit Number 11    Number of Visits 25    Date for SLP Re-Evaluation 04/20/20    Authorization Type 30 visit limit ST, 30 visits combined for PT/OT    Authorization - Visit Number 9    Authorization - Number of Visits 30    SLP Start Time [phone]    SLP Stop Time  1015    SLP Time Calculation (min) 41 min    Activity Tolerance Patient tolerated treatment well           History reviewed. No pertinent past medical history.  History reviewed. No pertinent surgical history.  There were no vitals filed for this visit.   Subjective Assessment - 03/08/20 0955    Subjective Pt arrives with wife    Patient is accompained by: Family member   April - wife                ADULT SLP TREATMENT - 03/08/20 1305      General Information   Behavior/Cognition Alert;Cooperative;Pleasant mood      Treatment Provided   Treatment provided Cognitive-Linquistic      Cognitive-Linquistic Treatment   Treatment focused on Cognition;Patient/family/caregiver education    Skilled Treatment Pt completed homework of planning 3-day weekend somewhere no more than 5 hours away - pt accounted for everything with a budget of $1000 and spent approx $600. Pt shared that he did not turn off dog collar yesterday while wife at work - forgot to do so. Pt and wife agree pt has caught other details such as plastic bag left on toaster oven (by his wife) which he removed prior to using Monday, and remembered to tell her about this later. SLP ?s pt's decr'd attention to detail. SLP questioned Dennis Mercado and wife about other times he has  overlooked details and neither thought of any except the dog collar. Pt to plan dinner meal each night until next ST session and write down summary of how the planning went. SLP explained this informaiton is improtant for SLPs to know for frequency changes and/or d/c planning. Pt wrote his homework down on a sticky note independently.       Assessment / Recommendations / Plan   Plan Continue with current plan of care      Progression Toward Goals   Progression toward goals Progressing toward goals              SLP Short Term Goals - 03/01/20 1143      SLP SHORT TERM GOAL #1   Title pt will initiate use of memory compensations in 3 sessions    Baseline 02-10-20, 02-14-20, 02-15-20    Period --   or 9 sessions   Status Achieved      SLP SHORT TERM GOAL #2   Title pt will use memory compensations successfully between/in 3 sessions    Baseline 02-10-20, 02-14-20, 02-16-20    Time --   or 13 sessions   Status Achieved      SLP SHORT TERM GOAL #3   Title pt will tell SLP 3 cognitive linguistic deficits in 2 sessions    Time --  or9 sessions   Status Deferred      SLP SHORT TERM GOAL #4   Title pt will demonstrate error awareness in therapy tasks 100% with spontaneous re-check, in 3 sessions    Baseline 02-10-20, 02-14-20, 02-16-20 (breakfast menu)    Time --   or 13 sessions   Status Achieved      SLP SHORT TERM GOAL #5   Title pt will demo selective attention in mod noisy environment for 10 minutes while completing simple-mod complex therapy task/s in 3 sessions    Baseline 02-16-20    Period --   or 13 sessions   Status Partially Met            SLP Long Term Goals - 03/08/20 1407      SLP LONG TERM GOAL #1   Title pt will demonstrate WFL attention and problem solving skills 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) to prepare for return to work 100% with self-corrections in 3 sessions    Baseline 02-10-20, 02-14-20,    Period --   or 25 sessions, for all LTGs    Status Achieved      SLP LONG TERM GOAL #2   Title pt will utilize functional  memory compensations successfully between therapy tasks reported by pt and/or wife between 4 sessions    Baseline 02-16-20, 02-23-20, 03-01-20    Status Achieved      SLP LONG TERM GOAL #3   Title pt will demonstrate divided attention in two simple therapy tasks in 3 sessions    Time 2    Period Weeks    Status On-going      SLP LONG TERM GOAL #4   Title pt will use compensations for attention successfully PRN in 2 sessions    Time 6    Period Weeks    Status On-going            Plan - 03/08/20 1406    Clinical Impression Statement Pt presents today with improving cognitive linguistic deficits in areas of attention and awareness. Pt planned a 3-day weekend away with wife, with budget met and pertinent details taken care of. I recommend skilled ST to target deficit areas mentioned before, in order to return to PLOF.Pt likely not need to be seen >4 more sessions    Speech Therapy Frequency 1x /week    Duration --   12 weeks, or 25 total visits   Treatment/Interventions Internal/external aids;Patient/family education;Compensatory strategies;Cognitive reorganization;Cueing hierarchy;SLP instruction and feedback;Functional tasks;Environmental controls    Potential to Achieve Goals Good    Potential Considerations Cooperation/participation level   awareness          Patient will benefit from skilled therapeutic intervention in order to improve the following deficits and impairments:   Cognitive communication deficit    Problem List Patient Active Problem List   Diagnosis Date Noted  . History of CVA (cerebrovascular accident) 01/19/2020  . Normocytic anemia   . Benign essential HTN   . Hyponatremia   . Acute blood loss anemia   . Hypotension due to drugs   . Vascular headache   . Prerenal azotemia   . Sleep disturbance   . Leukocytosis   . Transaminitis   . Marijuana abuse   . Essential  hypertension   . Dysphagia, post-stroke   . Left hemiparesis ([place])   . Acute respiratory failure ([place])   . ICH (intracerebral hemorrhage) ([place]) 12/06/2019    [place] ,[place], [place]  03/08/2020, 2:10 PM  [facility] [address] [place], [place], [address] Phone: [phone]   Fax:  [phone]   Name: Dennis Mercado MRN: [id number] Date of Birth: Nov 12, 1980

## 2020-03-08 NOTE — Therapy (Signed)
[facility] [address] [place], [place], [address] Phone: [phone]   Fax:  [phone]  Physical Therapy Treatment  Patient Details  Name: Dennis Mercado MRN: [id number] Date of Birth: Sep 22, 1980 Referring Provider (PT): Ang[place], Chapman Fitch (will be followed by Dr. Letta Pate)   Encounter Date: 03/08/2020   PT End of Session - 03/08/20 1120    Visit Number 9    Number of Visits 15    Date for PT Re-Evaluation 03/18/20    Authorization Type [employer], VL for PT and OT = 30 (zero used, if seen on same day, will count as 2 visits), VL for ST = 30    Authorization - Visit Number 9    Authorization - Number of Visits 15    PT Start Time 0843    PT Stop Time 0934    PT Time Calculation (min) 51 min    Equipment Utilized During Treatment Gait belt    Activity Tolerance Patient tolerated treatment well    Behavior During Therapy [facility] for tasks assessed/performed           No past medical history on file.  No past surgical history on file.  There were no vitals filed for this visit.   Subjective Assessment - 03/08/20 0848    Subjective Has done a lot of walking - took the dog out yesterday by himself.    Pertinent History HTN    Patient Stated Goals wants to start walking as soon as possible - "less walker and downgrade to a cane and then nothing at all"    Currently in Pain? No/denies                             [facility] Adult PT Treatment/Exercise - 03/08/20 0001      Transfers   Transfers Sit to Stand;Stand to Sit;Stand Pivot Transfers    Sit to Stand 5: Supervision;4: Min guard    Sit to Stand Details Verbal cues for sequencing;Verbal cues for precautions/safety;Manual facilitation for weight shifting;Manual facilitation for placement;Verbal cues for safe use of DME/AE    Sit to Stand Details (indicate cue type and reason) initial verbal cues to perform with equal distance between BLE vs. RLE  more staggered in order for equal weight bearing    Stand to Sit 5: Supervision    Stand to Sit Details (indicate cue type and reason) Verbal cues for sequencing;Verbal cues for precautions/safety;Verbal cues for safe use of DME/AE;Manual facilitation for weight shifting;Manual facilitation for placement    Comments standing turning from mat table to face mat: use of chair for balance and min A for turning      Ambulation/Gait   Ambulation/Gait Yes    Ambulation/Gait Assistance 5: Supervision;4: Min guard    Ambulation/Gait Assistance Details into clinic with RW, therapist providing intermittent tactile cues to relax R shoulder, as well as assist at pelvis to prevent incr pelvic retraction on LLE    Ambulation Distance (Feet) 115 Feet   x1, 100 x 1   Assistive device Rolling walker    Gait Pattern Decreased step length - right;Decreased stance time - left;Decreased hip/knee flexion - left;Decreased weight shift to left;Left flexed knee in stance;Narrow base of support;Poor foot clearance - left;Step-through pattern    Ambulation Surface Level;Indoor      Therapeutic Activites    Therapeutic Activities Other Therapeutic Activities    Other Therapeutic Activities discussed with pt and pt's  spouse option of aquatic therapy for either OT/PT for strength, balance, decr tone, improving gait, and improving functional mobility of LUE/LLE, pt reporting that he would be interested and may want to do it every other week (due to pt's visit limit). primary PT to reach out to aquatic PT/OT       Neuro Re-ed    Neuro Re-ed Details  standing facing towards mat table in modified quadraped position with chair posteriorly: with LUE placed on upside down bowl for incr weight bearing and RUE on mat, performed shifting and holding weight towards L side and x10 reps stepping RLE out and in, then performed x8 reps mini squat holds with only LUE on bowl for weight bearing (not using RUE), at end of session performed  seated from mat and chair anteriorly with LUE support on bowl and RUE on chair, foam bubble under RLE for incr weight shifting to LLE, x5 reps mini squat holds. at staircase holding onto railing with RUE: performed RLE step ups to 6" step and then stepping down posteriorly with LLE x6 reps, cues for incr hip/knee flexion with LLE when bringing foot up.      Exercises   Exercises Other Exercises;Knee/Hip    Other Exercises  performed supine hip flexor stretch with LLE off of edge of mat table, 2 x 60 second holds, verbally added to pt's HEP for pt to perform with wife's assist.after stretching L hip flexors, performed bridging into full range and then lifting RLE into a march for incr weight bearing through LLE x10 reps with therapist assisting with maintaing LLE in proper position when fatigued      Knee/Hip Exercises: Standing   Forward Step Up Left;1 set;10 reps;Hand Hold: 1;Step Height: 6"   RUE support on rail   Forward Step Up Limitations cues for full knee extension and posture in standing                    PT Short Term Goals - 02/25/20 1427      PT SHORT TERM GOAL #1   Title Pt and pt's spouse will be independent with initial HEP in order to build upon functional gains made in therapy. ALL STGS DUE 02/15/20    Time 4    Period Weeks    Status Achieved    Target Date 02/15/20      PT SHORT TERM GOAL #2   Title Pt will perform squat pivot vs. stand pivot with supervision in order to improve functional mobility and decr caregiver burden.    Baseline squat pivot with min guard, stand pivot with min A    Time 4    Period Weeks    Status Not Met      PT SHORT TERM GOAL #3   Title Pt will perform sit <> stands with RW with supervision and pt able to place LUE in L hand orthosis with supervision in order to improve functional mobility.    Baseline supervision/min guard    Time 4    Period Weeks    Status Partially Met      PT SHORT TERM GOAL #4   Title Pt will ambulate  at least 100' with RW and min guard in order to improve household mobility.    Time 4    Period Weeks    Status Achieved      PT SHORT TERM GOAL #5   Title Pt will perform standing balance with single UE support at sink for  5 minutes with supervision in order to incr tolerance for ADLs.    Baseline 5 minutes for dynamic tasks with single/no UE support at sink    Time 4    Period Weeks    Status Achieved             PT Long Term Goals - 01/18/20 0941      PT LONG TERM GOAL #1   Title Pt and pt's spouse will be independent with final HEP in order to build upon functional gains made in therapy. ALL LTGS DUE 03/14/20    Time 8    Period Weeks    Status New    Target Date 03/14/20      PT LONG TERM GOAL #2   Title Pt will ambulate at least 230' with RW vs. LRAD and supervision in order to improve functional mobility.    Time 8    Period Weeks    Status New      PT LONG TERM GOAL #3   Title Pt will perform all bed mobility with mod I    Baseline 8    Period Weeks    Status New      PT LONG TERM GOAL #4   Title Pt will perform 2 steps with single HHA vs. LRAD with min guard in order to safely enter home.    Time 8    Period Weeks    Status New      PT LONG TERM GOAL #5   Title Pt will perform all transfers with mod I in order to decr caregiver burden and improve functional mobility.    Time 8    Period Weeks    Status New                 Plan - 03/08/20 1137    Clinical Impression Statement Pt ambulated into session today with RW with supervision. Added supine hip flexor stretch for LLE to pt's HEP for home to improve hip extension ROM for gait, pt reporting stretch felt good and walking felt better afterwards. Remainder of session focused on LLE strengthening and NMR/weight shifting and bearing through L side. Pt with decr spasticity in LUE after weight bearing. Will continue to progress towards LTGs.    Personal Factors and Comorbidities Comorbidity  1;Past/Current Experience;Age;Profession    Comorbidities HTN    Examination-Activity Limitations Bathing;Bed Mobility;Carry;Hygiene/Grooming;Stand;Stairs;Squat;Transfers;Locomotion Level    Examination-Participation Restrictions Community Activity;Occupation   walking his dog   Stability/Clinical Decision Making Evolving/Moderate complexity    Rehab Potential Good    PT Frequency 2x / week    PT Duration --   7 weeks   PT Treatment/Interventions ADLs/Self Care Home Management;Aquatic Therapy;Electrical Stimulation;DME Instruction;Gait training;Stair training;Functional mobility training;Therapeutic activities;Therapeutic exercise;Balance training;Neuromuscular re-education;Wheelchair mobility training;Orthotic Fit/Training;Patient/family education;Passive range of motion;Energy conservation;Vestibular    PT Next Visit Plan will need to check goals standing balance with no UE support, tall kneeling?, step ups with LLE, NMR for weight shifting through LLE.    PT Home Exercise Plan [email or web address]    Consulted and Agree with Plan of Care Patient;Family member/caregiver    Family Member Consulted wife, April           Patient will benefit from skilled therapeutic intervention in order to improve the following deficits and impairments:  Abnormal gait, Decreased activity tolerance, Decreased balance, Decreased mobility, Decreased knowledge of use of DME, Decreased coordination, Decreased range of motion, Decreased strength, Difficulty walking, Impaired tone, Impaired sensation, Postural dysfunction  Visit  Diagnosis: Unsteadiness on feet  Hemiplegia and hemiparesis following cerebral infarction affecting left non-dominant side (HCC)  Other symptoms and signs involving the nervous system  Muscle weakness (generalized)  Other disturbances of skin sensation     Problem List Patient Active Problem List   Diagnosis Date Noted   History of CVA (cerebrovascular accident) 01/19/2020   Normocytic  anemia    Benign essential HTN    Hyponatremia    Acute blood loss anemia    Hypotension due to drugs    Vascular headache    Prerenal azotemia    Sleep disturbance    Leukocytosis    Transaminitis    Marijuana abuse    Essential hypertension    Dysphagia, post-stroke    Left hemiparesis ([place])    Acute respiratory failure (HCC)    ICH (intracerebral hemorrhage) ([place]) 12/06/2019    Arliss Journey, PT, DPT  03/08/2020, 11:39 AM  [facility] [address] [place], [place], [address] Phone: [phone]   Fax:  [phone]  Name: Dennis Mercado MRN: [id number] Date of Birth: Apr 22, 1981

## 2020-03-09 ENCOUNTER — Encounter: Payer: Self-pay | Admitting: Physical Medicine & Rehabilitation

## 2020-03-09 ENCOUNTER — Encounter
Payer: BC Managed Care – PPO | Attending: Physical Medicine & Rehabilitation | Admitting: Physical Medicine & Rehabilitation

## 2020-03-09 VITALS — BP 124/87 | HR 95 | Temp 98.8°F | Ht 67.0 in | Wt 139.0 lb

## 2020-03-09 DIAGNOSIS — Z8673 Personal history of transient ischemic attack (TIA), and cerebral infarction without residual deficits: Secondary | ICD-10-CM | POA: Insufficient documentation

## 2020-03-09 DIAGNOSIS — G8114 Spastic hemiplegia affecting left nondominant side: Secondary | ICD-10-CM | POA: Diagnosis not present

## 2020-03-09 NOTE — Progress Notes (Signed)
Subjective:    Patient ID: Dennis Mercado, male    DOB: 1981-05-07, 39 y.o.   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  Pt has completed inpt rehab and is now attending outpt rehab, OT, PT ,SLP.  Pt is now dressing and bathing self .  No falls at home.  Lives with wife, has not worked or drivin since [place].  Ambulation is with walker  Pt notes problem with impotence, does report nocturnal tumescence.  Asking about antihypertensive med effects    Pain Inventory Average Pain 0 Pain Right Now 0 My pain is no pain  In the last 24 hours, has pain interfered with the following? General activity 0 Relation with others 0 Enjoyment of life 0 What TIME of day is your pain at its worst? No pain Sleep (in general) Good  Pain is worse with: no pain Pain improves with: no pain Relief from Meds: no pain  History reviewed. No pertinent family history. Social History   Socioeconomic History  . Marital status: Married    Spouse name: Not on file  . Number of children: Not on file  . Years of education: Not on file  . Highest education level: Not on file  Occupational History  . Not on file  Tobacco Use  . Smoking status: Never Smoker  . Smokeless tobacco: Never Used  Substance and Sexual Activity  . Alcohol use: Yes    Comment: several drinks over the weekend  . Drug use: Never  . Sexual activity: Not on file  Other Topics Concern  . Not on file  Social History Narrative  . Not on file   Social Determinants of Health   Financial Resource Strain:   . Difficulty of Paying Living Expenses: Not on file  Food Insecurity:   . Worried About Charity fundraiser in the Last Year: Not on file  . Ran Out of Food in the Last Year: Not on file  Transportation Needs:   . Lack of Transportation (Medical): Not on file  . Lack of  Transportation (Non-Medical): Not on file  Physical Activity:   . Days of Exercise per Week: Not on file  . Minutes of Exercise per Session: Not on file  Stress:   . Feeling of Stress : Not on file  Social Connections:   . Frequency of Communication with Friends and Family: Not on file  . Frequency of Social Gatherings with Friends and Family: Not on file  . Attends Religious Services: Not on file  . Active Member of Clubs or Organizations: Not on file  . Attends Archivist Meetings: Not on file  . Marital Status: Not on file   History reviewed. No pertinent surgical history. History reviewed. No pertinent surgical history. History reviewed. No pertinent past medical history. BP 124/87   Pulse 95   Temp 98.8 F (37.1 C)   Ht 5\' 7"  (1.702 m)   Wt 139 lb (63 kg)   SpO2 97%   BMI 21.77 kg/m   Opioid Risk Score:   Fall Risk Score:  `1  Depression screen PHQ 2/9  Depression screen PHQ 2/9 01/25/2020  Decreased Interest 0  Down, Depressed, Hopeless 0  PHQ - 2 Score 0  Altered sleeping 0  Tired, decreased energy 0  Change in appetite 0  Feeling bad or failure about yourself  0  Trouble concentrating 0  Moving slowly or fidgety/restless 0  Suicidal thoughts 0  PHQ-9 Score 0    Review of Systems  Constitutional: Negative.   HENT: Negative.   Eyes: Negative.   Respiratory: Negative.   Cardiovascular: Negative.   Gastrointestinal: Negative.   Endocrine: Negative.   Genitourinary: Negative.   Musculoskeletal: Positive for gait problem.  Allergic/Immunologic: Negative.   Hematological: Negative.   Psychiatric/Behavioral: Negative.   All other systems reviewed and are negative.      Objective:   Physical Exam Constitutional:      Appearance: He is normal weight.  Eyes:     Extraocular Movements: Extraocular movements intact.     Conjunctiva/sclera: Conjunctivae normal.     Pupils: Pupils are equal, round, and reactive to light.  Neurological:      Mental Status: He is alert.     Cranial Nerves: No dysarthria.     Sensory: Sensory deficit present.     Motor: No weakness.     Coordination: Coordination abnormal. Finger-Nose-Finger Test abnormal and Heel to [facility] Test abnormal. Impaired rapid alternating movements.     Gait: Gait abnormal.     Comments: Motor 3- Left delt , biceps, 0/5 triceps , finger and wrist flexor , increased tone in elbow wrist and finger flexors  MAS 3 in L elbow flexors, wrist, finger and thumb flexors  Sensation reduce light touch left hand , intact left foot  + astereognosis  Psychiatric:        Mood and Affect: Mood normal. Affect is flat.           Assessment & Plan:  1.  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  ED pt on multiple antihypertensive meds but does have nocturnal tumescence, amlodipine with low incidence (1-2%) of ED, ED reported for carvedilol (no percentage given in [facility]) , 1-10% incidence with lisinopril, no ED reports for clonidine- pt has appt with PCP next week can discuss change in therapy, referral to urology.  Would need neuro approval to start PDE-5 inhibitor  >81min appt with counseling and coordination of care Also may

## 2020-03-09 NOTE — Patient Instructions (Signed)
Please call if you would like to schedule a Botox injection

## 2020-03-10 ENCOUNTER — Telehealth: Payer: Self-pay | Admitting: *Deleted

## 2020-03-10 NOTE — Telephone Encounter (Signed)
Please authorize for botulinum toxin, Botox 300 units [id number] 81.10

## 2020-03-10 NOTE — Telephone Encounter (Signed)
Mr Meinzer called and reported that he would like to proceed with botox which will need a prior auth.

## 2020-03-13 NOTE — Telephone Encounter (Signed)
Dennis Mercado called back asking again about the Botox.  I have called him back to let him know it is in progress of getting authorized by insurance.

## 2020-03-13 NOTE — Telephone Encounter (Signed)
Patients appointment is in October.  Dennis Mercado will get the auth as it gets closer to appt time.

## 2020-03-15 ENCOUNTER — Encounter: Payer: Self-pay | Admitting: Physical Therapy

## 2020-03-15 ENCOUNTER — Ambulatory Visit: Payer: BC Managed Care – PPO

## 2020-03-15 ENCOUNTER — Other Ambulatory Visit: Payer: Self-pay

## 2020-03-15 ENCOUNTER — Ambulatory Visit: Payer: BC Managed Care – PPO | Admitting: Physical Therapy

## 2020-03-15 DIAGNOSIS — R2681 Unsteadiness on feet: Secondary | ICD-10-CM

## 2020-03-15 DIAGNOSIS — R29818 Other symptoms and signs involving the nervous system: Secondary | ICD-10-CM | POA: Diagnosis not present

## 2020-03-15 DIAGNOSIS — R2689 Other abnormalities of gait and mobility: Secondary | ICD-10-CM

## 2020-03-15 DIAGNOSIS — I69354 Hemiplegia and hemiparesis following cerebral infarction affecting left non-dominant side: Secondary | ICD-10-CM | POA: Diagnosis not present

## 2020-03-15 DIAGNOSIS — R41841 Cognitive communication deficit: Secondary | ICD-10-CM

## 2020-03-15 DIAGNOSIS — M6281 Muscle weakness (generalized): Secondary | ICD-10-CM

## 2020-03-15 DIAGNOSIS — R209 Unspecified disturbances of skin sensation: Secondary | ICD-10-CM | POA: Diagnosis not present

## 2020-03-15 DIAGNOSIS — Z8673 Personal history of transient ischemic attack (TIA), and cerebral infarction without residual deficits: Secondary | ICD-10-CM

## 2020-03-15 NOTE — Patient Instructions (Signed)
  Please complete the assigned speech therapy homework prior to your next session and return it to the speech therapist at your next visit.  

## 2020-03-15 NOTE — Therapy (Signed)
[facility] [address] [place], [place], [address] Phone: [phone]   Fax:  [phone]  Speech Language Pathology Treatment  Patient Details  Name: Dennis Mercado MRN: [id number] Date of Birth: January 24, 1981 Referring Provider (SLP): Mar[place]e [place], [place]   Encounter Date: 03/15/2020   End of Session - 03/15/20 0939    Visit Number 12    Number of Visits 25    Date for SLP Re-Evaluation 04/20/20    Authorization Type 30 visit limit ST, 30 visits combined for PT/OT    Authorization - Visit Number 11    Authorization - Number of Visits 73    SLP Start Time 0803    SLP Stop Time  0845    SLP Time Calculation (min) 42 min    Activity Tolerance Patient tolerated treatment well           History reviewed. No pertinent past medical history.  History reviewed. No pertinent surgical history.  There were no vitals filed for this visit.   Subjective Assessment - 03/15/20 0810    Patient is accompained by: Family member   wife - April   Currently in Pain? No/denies                 ADULT SLP TREATMENT - 03/15/20 0812      General Information   Behavior/Cognition Alert;Cooperative;Pleasant mood      Treatment Provided   Treatment provided Cognitive-Linquistic      Cognitive-Linquistic Treatment   Treatment focused on Cognition    Skilled Treatment Pt planned all dinners since last session. Pt has experienced some difficulty opening 2 items with seals - Gatorade and salad dressing - had to wait until wife came home. Other than that no difficulties with wife being at work other than forgetting to turn on the dog collar yesterday prior to sending dog outside, but reports he quickly realized it and called dog back in and turned it on. SLP told pt and wife to be noting if he is missing details more frequently than prior to CVA. If so, please let SLP know unless if everything looks the same next session, we will go  every other week. Tommie Raymond and wife both agree with this plan.       Assessment / Recommendations / Plan   Plan Continue with current plan of care   If same or better next session, go once every other week ST     Progression Toward Goals   Progression toward goals Progressing toward goals              SLP Short Term Goals - 03/01/20 1143      SLP SHORT TERM GOAL #1   Title pt will initiate use of memory compensations in 3 sessions    Baseline 02-10-20, 02-14-20, 02-15-20    Period --   or 9 sessions   Status Achieved      SLP SHORT TERM GOAL #2   Title pt will use memory compensations successfully between/in 3 sessions    Baseline 02-10-20, 02-14-20, 02-16-20    Time --   or 13 sessions   Status Achieved      SLP SHORT TERM GOAL #3   Title pt will tell SLP 3 cognitive linguistic deficits in 2 sessions    Time --   or9 sessions   Status Deferred      SLP SHORT TERM GOAL #4   Title pt will demonstrate error awareness in therapy tasks 100%  with spontaneous re-check, in 3 sessions    Baseline 02-10-20, 02-14-20, 02-16-20 (breakfast menu)    Time --   or 13 sessions   Status Achieved      SLP SHORT TERM GOAL #5   Title pt will demo selective attention in mod noisy environment for 10 minutes while completing simple-mod complex therapy task/s in 3 sessions    Baseline 02-16-20    Period --   or 13 sessions   Status Partially Met            SLP Long Term Goals - 03/15/20 5176      SLP LONG TERM GOAL #1   Title pt will demonstrate WFL attention and problem solving skills 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) to prepare for return to work 100% with self-corrections in 3 sessions    Baseline 02-10-20, 02-14-20,    Period --   or 25 sessions, for all LTGs   Status Achieved      SLP LONG TERM GOAL #2   Title pt will utilize functional  memory compensations successfully between therapy tasks reported by pt and/or wife between 4 sessions    Baseline 02-16-20, 02-23-20,  03-01-20    Status Achieved      SLP LONG TERM GOAL #3   Title pt will demonstrate divided attention in two simple therapy tasks in 3 sessions    Time 1    Period Weeks    Status On-going      SLP LONG TERM GOAL #4   Title pt will use compensations for attention successfully PRN in 2 sessions    Time [place] - 03/15/20 0939    Clinical Impression Statement Pt presents today with reported improving cognitive linguistic deficits in areas of attention and awareness. Pt planned meals (lunch and dinner) each day since last session, without any problems reported.  Skilled ST remains necessary to ensure consistency with success and to detect any problem areas and assist pt in correcting. Pt likely not need to be seen >3 more sessions. If everything same or better next session reduce to once every other week.    Speech Therapy Frequency 1x /week    Duration --   12 weeks, or 25 total visits   Treatment/Interventions Internal/external aids;Patient/family education;Compensatory strategies;Cognitive reorganization;Cueing hierarchy;SLP instruction and feedback;Functional tasks;Environmental controls    Potential to Achieve Goals Good    Potential Considerations Cooperation/participation level   awareness          Patient will benefit from skilled therapeutic intervention in order to improve the following deficits and impairments:   Cognitive communication deficit    Problem List Patient Active Problem List   Diagnosis Date Noted  . History of CVA (cerebrovascular accident) 01/19/2020  . Normocytic anemia   . Benign essential HTN   . Hyponatremia   . Acute blood loss anemia   . Hypotension due to drugs   . Vascular headache   . Prerenal azotemia   . Sleep disturbance   . Leukocytosis   . Transaminitis   . Marijuana abuse   . Essential hypertension   . Dysphagia, post-stroke   . Left hemiparesis ([place])   . Acute respiratory failure  ([place])   . ICH (intracerebral hemorrhage) ([place]) 12/06/2019    [facility], [place]  03/15/2020, 9:42 AM  [facility] [address]  [place], [place], [address] Phone: [phone]   Fax:  [phone]   Name: Savio Albrecht MRN: [id number] Date of Birth: [birth year]

## 2020-03-16 ENCOUNTER — Ambulatory Visit (INDEPENDENT_AMBULATORY_CARE_PROVIDER_SITE_OTHER): Payer: BC Managed Care – PPO | Admitting: Family Medicine

## 2020-03-16 ENCOUNTER — Encounter: Payer: Self-pay | Admitting: Family Medicine

## 2020-03-16 VITALS — BP 110/72 | HR 81 | Temp 97.3°F | Ht 67.0 in | Wt 141.0 lb

## 2020-03-16 DIAGNOSIS — I10 Essential (primary) hypertension: Secondary | ICD-10-CM

## 2020-03-16 DIAGNOSIS — Z8673 Personal history of transient ischemic attack (TIA), and cerebral infarction without residual deficits: Secondary | ICD-10-CM

## 2020-03-16 DIAGNOSIS — E785 Hyperlipidemia, unspecified: Secondary | ICD-10-CM

## 2020-03-16 DIAGNOSIS — E1169 Type 2 diabetes mellitus with other specified complication: Secondary | ICD-10-CM | POA: Diagnosis not present

## 2020-03-16 NOTE — Therapy (Addendum)
[facility] [address] [place], [place], [address] Phone: [phone]   Fax:  [phone]  Physical Therapy Treatment/Re-Cert  Patient Details  Name: Dennis Mercado MRN: [id number] Date of Birth: 01-May-1981 Referring Provider (PT): [place], [place] Fitch (will be followed by Dr. Letta Pate)   Encounter Date: 03/15/2020   PT End of Session - 03/16/20 1505    Visit Number 10    Number of Visits 15    Date for PT Re-Evaluation 04/28/20    Authorization Type [employer], VL for PT and OT = 30 (zero used, if seen on same day, will count as 2 visits), VL for ST = 30    Authorization - Visit Number 10    Authorization - Number of Visits 15    PT Start Time 0845    PT Stop Time 0934    PT Time Calculation (min) 49 min    Equipment Utilized During Treatment Gait belt    Activity Tolerance Patient tolerated treatment well    Behavior During Therapy [facility] for tasks assessed/performed           History reviewed. No pertinent past medical history.  History reviewed. No pertinent surgical history.  There were no vitals filed for this visit.      03/15/20 0853  Symptoms/Limitations  Subjective Saw Dr Letta Pate last week - next time he comes in, it will be for a Botox injection. No falls. Exercises are going well.  Pertinent History HTN  Patient Stated Goals wants to start walking as soon as possible - "less walker and downgrade to a cane and then nothing at all"  Pain Assessment  Currently in Pain? No/denies                     [facility] Adult PT Treatment/Exercise - 03/17/20 0001      Transfers   Sit to Stand 4: Min guard;5: Supervision    Sit to Stand Details Verbal cues for sequencing;Verbal cues for precautions/safety;Manual facilitation for weight shifting;Manual facilitation for placement;Verbal cues for safe use of DME/AE    Sit to Stand Details (indicate cue type and reason) initial verbal cues with feet  apart equal distance     Stand to Sit 5: Supervision    Stand to Sit Details (indicate cue type and reason) Verbal cues for sequencing;Verbal cues for precautions/safety;Verbal cues for safe use of DME/AE;Manual facilitation for weight shifting;Manual facilitation for placement    Comments x5 reps sit <> stands with no UE support with LLE staggered posteriorly with no UE support, RW in front        Ambulation/Gait   Ambulation/Gait Yes    Ambulation/Gait Assistance 5: Supervision;4: Min guard    Ambulation/Gait Assistance Details pt demonstrating incr L pelvic retraction, therapist providing tactile and manual cue at pt's L ASIS to "push into therapist's hand" to improve L hip flexion during swing phase, with pt demonstrating improvement with incr gait distance. intermittent tactile cues at pt's R shoulder to relax as pt tends to ambulate with R shoulder hiked up    Ambulation Distance (Feet) 115 Feet   x2   Assistive device Rolling walker    Gait Pattern Decreased step length - right;Decreased stance time - left;Decreased hip/knee flexion - left;Decreased weight shift to left;Left flexed knee in stance;Narrow base of support;Poor foot clearance - left;Step-through pattern    Ambulation Surface Level;Indoor      Therapeutic Activites    Therapeutic Activities Other Therapeutic Activities  Other Therapeutic Activities discussed potential scheduling for aquatics for OT - let pt know that primary OT would have to discuss with OT that does aquatics      Neuro Re-ed    Neuro Re-ed Details  in // bars with mirror anteriorly for visual feedback: with single UE support weight shifting over to LLE with tactile and verbal cues for L quad activation and tapping 4" step with RLE for incr weight bearing through LLE 2 x10 reps, attempted with no UE support however pt unable to perform. Downgraded to use of 2" step x10 reps with beginning with single UE support and then a couple of reps with none, needing  min/mod A for balance       Exercises   Exercises Other Exercises;Knee/Hip    Other Exercises  Seated at edge of mat: with pillow case under L foot to decr friction 2 x 10 reps of L knee flexion with use of red tband for resistance, with assist from therapist for incr ROM.                Balance Exercises - 03/17/20 0001      Balance Exercises: Standing   Rockerboard Lateral;Limitations    Rockerboard Limitations on rockerboard in M/L direction: massed practice weight shifting from R/L with approx. 5 second holds to LLE for incr weight shifting, progressing to no UE support with min guard/min A at times for balance. use of mirror anteriorly for visual feedback on midline and weight shifting. Then performing with no UE support and keeping board still: 2 x 5 reps head nods, 2 x 5 reps head turns - min A for balance                PT Short Term Goals - 02/25/20 1427      PT SHORT TERM GOAL #1   Title Pt and pt's spouse will be independent with initial HEP in order to build upon functional gains made in therapy. ALL STGS DUE 02/15/20    Time 4    Period Weeks    Status Achieved    Target Date 02/15/20      PT SHORT TERM GOAL #2   Title Pt will perform squat pivot vs. stand pivot with supervision in order to improve functional mobility and decr caregiver burden.    Baseline squat pivot with min guard, stand pivot with min A    Time 4    Period Weeks    Status Not Met      PT SHORT TERM GOAL #3   Title Pt will perform sit <> stands with RW with supervision and pt able to place LUE in L hand orthosis with supervision in order to improve functional mobility.    Baseline supervision/min guard    Time 4    Period Weeks    Status Partially Met      PT SHORT TERM GOAL #4   Title Pt will ambulate at least 100' with RW and min guard in order to improve household mobility.    Time 4    Period Weeks    Status Achieved      PT SHORT TERM GOAL #5   Title Pt will perform standing  balance with single UE support at sink for 5 minutes with supervision in order to incr tolerance for ADLs.    Baseline 5 minutes for dynamic tasks with single/no UE support at sink    Time 4    Period Weeks  Status Achieved             PT Long Term Goals - 03/17/20 1247      PT LONG TERM GOAL #1   Title Pt and pt's spouse will be independent with final HEP in order to build upon functional gains made in therapy. ALL LTGS DUE 03/14/20    Baseline pt reports consistently performing current HEP - will continue to benefit from further additions    Time 8    Period Weeks    Status Achieved      PT LONG TERM GOAL #2   Title Pt will ambulate at least 230' with RW vs. LRAD and supervision in order to improve functional mobility.    Baseline ambulates wth RW with supervision/min guard    Time 8    Period Weeks    Status Partially Met      PT LONG TERM GOAL #3   Title Pt will perform all bed mobility with mod I    Baseline not formally assessed today - however pt reports no issues with bed mobility at home.    Time 8    Period Weeks    Status Achieved      PT LONG TERM GOAL #4   Title Pt will perform 2 steps with single HHA vs. LRAD with min guard in order to safely enter home.    Baseline not yet assessed.    Time 8    Period Weeks    Status Not Met      PT LONG TERM GOAL #5   Title Pt will perform all transfers with mod I in order to decr caregiver burden and improve functional mobility.    Baseline performs sit <> stands with supervision/min guard with RW    Time 8    Period Weeks    Status Not Met             Revised LTGs for re-cert:      PT Long Term Goals - 03/17/20 1249      PT LONG TERM GOAL #1   Title Pt and pt's spouse will be independent with final HEP in order to build upon functional gains made in therapy. ALL LTGS DUE 04/21/20    Baseline pt reports consistently performing current HEP - will continue to benefit from further additions    Time 5     Period Weeks    Status On-going    Target Date 04/21/20      PT LONG TERM GOAL #2   Title Pt will ambulate at least 230' with RW vs. LRAD and supervision over unlevel surfaces in order to improve functional mobility.    Baseline ambulates wth RW with supervision/min guard over level surfaces    Time 5    Period Weeks    Status On-going      PT LONG TERM GOAL #3   Title Gait speed to be assessed with LTG to be written as appropriate in order to demo improved gait efficiency.    Time 5    Period Weeks    Status New      PT LONG TERM GOAL #4   Title Pt will perform 2 steps with single HHA vs. LRAD with min guard in order to safely enter home.    Baseline not yet assessed.    Time 5    Period Weeks    Status On-going      PT LONG TERM GOAL #5   Title  Pt will perform dynamic standing activites for 5 minutes with no UE support with min guard in order to demo improved balance for ADLs.    Time 5    Period Weeks    Status New                 Plan - 03/17/20 1245    Clinical Impression Statement Assessed pt's LTGs for re-cert: Pt has achieved LTG #1 and #3 in regards to HEP and bed mobility. Pt improving with gait with use of RW - is now walking in and out of session with supervision/min guard. Pt responded well today to tactile cues at L ASIS for incr hip protraction to help assist with incr L hip flexion during swing phase. Pt did not meet LTG #4 and #5 - have not yet assessed stairs and for transfers pt able to perform sit <> stands with supervision. Pt is progressing well with PT and will continue to benefit from skilled services in order to improve functional mobility and independence. Due to pt's limited visits for the remainder of the year, will re-cert for an additional 1x week for 5 weeks. LTGs revised/updated as appropriate.    Personal Factors and Comorbidities Comorbidity 1;Past/Current Experience;Age;Profession    Comorbidities HTN    Examination-Activity Limitations  Bathing;Bed Mobility;Carry;Hygiene/Grooming;Stand;Stairs;Squat;Transfers;Locomotion Level    Examination-Participation Restrictions Community Activity;Occupation   walking his dog   Stability/Clinical Decision Making Evolving/Moderate complexity    Rehab Potential Good    PT Frequency 1x / week    PT Duration --   5 WEEKS   PT Treatment/Interventions ADLs/Self Care Home Management;Aquatic Therapy;Electrical Stimulation;DME Instruction;Gait training;Stair training;Functional mobility training;Therapeutic activities;Therapeutic exercise;Balance training;Neuromuscular re-education;Wheelchair mobility training;Orthotic Fit/Training;Patient/family education;Passive range of motion;Energy conservation;Vestibular    PT Next Visit Plan standing balance with decr UE support, tall kneeling?, step ups with LLE, NMR for weight shifting through LLE and LUE    PT Home Exercise Plan [id number]    Consulted and Agree with Plan of Care Patient;Family member/caregiver    Family Member Consulted wife, April           Patient will benefit from skilled therapeutic intervention in order to improve the following deficits and impairments:  Abnormal gait, Decreased activity tolerance, Decreased balance, Decreased mobility, Decreased knowledge of use of DME, Decreased coordination, Decreased range of motion, Decreased strength, Difficulty walking, Impaired tone, Impaired sensation, Postural dysfunction  Visit Diagnosis: History of CVA (cerebrovascular accident)  Unsteadiness on feet  Hemiplegia and hemiparesis following cerebral infarction affecting left non-dominant side (HCC)  Other symptoms and signs involving the nervous system  Muscle weakness (generalized)  Other abnormalities of gait and mobility     Problem List Patient Active Problem List   Diagnosis Date Noted  . History of CVA (cerebrovascular accident) 01/19/2020  . Normocytic anemia   . Benign essential HTN   . Hyponatremia   . Acute blood  loss anemia   . Hypotension due to drugs   . Vascular headache   . Prerenal azotemia   . Sleep disturbance   . Leukocytosis   . Transaminitis   . Marijuana abuse   . Essential hypertension   . Dysphagia, post-stroke   . Left hemiparesis ([place])   . Acute respiratory failure ([place])   . ICH (intracerebral hemorrhage) ([place]) 12/06/2019    Arliss Journey, PT, DPT  03/17/2020, 12:47 PM  [facility] [address] [place], [place], [address] Phone: [phone]   Fax:  [phone]  Name:  Dennis Mercado MRN: [id number] Date of Birth: 08/17/1980

## 2020-03-16 NOTE — Progress Notes (Signed)
Established Patient Office Visit  Subjective:  Patient ID: Dennis Mercado, male    DOB: 06/22/1980  Age: 39 y.o. MRN: [id number]  CC:  Chief Complaint  Patient presents with  . Follow-up    2 month follow up, patient would like to know if he could take supplements, also what would be a high BP number where patient would need to call about.     HPI [name] presents for follow-up of his hypertension and cholesterol.  Blood pressure has been running in the 110 over low 70 range.  Has been taking Norvasc 10 mg, carvedilol 25 twice daily, clonidine 0.1 twice daily, and Zestril 5 mg.  Tolerating all of these medications without side effects.  Denies lower extremity edema or cough.  Denies myalgias with atorvastatin.  Continues to work with physical medicine and rehabilitation.  Now able to grasp with his left hand and open it without assistance.  Continues to use walker with 2 posts.  Accompanied by his wife today.  No past medical history on file.  No past surgical history on file.  No family history on file.  Social History   Socioeconomic History  . Marital status: Married    Spouse name: Not on file  . Number of children: Not on file  . Years of education: Not on file  . Highest education level: Not on file  Occupational History  . Not on file  Tobacco Use  . Smoking status: Never Smoker  . Smokeless tobacco: Never Used  Substance and Sexual Activity  . Alcohol use: Yes    Comment: several drinks over the weekend  . Drug use: Never  . Sexual activity: Not on file  Other Topics Concern  . Not on file  Social History Narrative  . Not on file   Social Determinants of Health   Financial Resource Strain:   . Difficulty of Paying Living Expenses: Not on file  Food Insecurity:   . Worried About Charity fundraiser in the Last Year: Not on file  . Ran Out of Food in the Last Year: Not on file  Transportation Needs:   . Lack of Transportation (Medical): Not on file    . Lack of Transportation (Non-Medical): Not on file  Physical Activity:   . Days of Exercise per Week: Not on file  . Minutes of Exercise per Session: Not on file  Stress:   . Feeling of Stress : Not on file  Social Connections:   . Frequency of Communication with Friends and Family: Not on file  . Frequency of Social Gatherings with Friends and Family: Not on file  . Attends Religious Services: Not on file  . Active Member of Clubs or Organizations: Not on file  . Attends Archivist Meetings: Not on file  . Marital Status: Not on file  Intimate Partner Violence:   . Fear of Current or Ex-Partner: Not on file  . Emotionally Abused: Not on file  . Physically Abused: Not on file  . Sexually Abused: Not on file    Outpatient Medications Prior to Visit  Medication Sig Dispense Refill  . acetaminophen (TYLENOL) 325 MG tablet Take 2 tablets (650 mg total) by mouth every 4 (four) hours as needed for mild pain (or temp > 37.5 C (99.5 F)).    Marland Kitchen amLODipine (NORVASC) 10 MG tablet Take 1 tablet (10 mg total) by mouth daily. 90 tablet 0  . atorvastatin (LIPITOR) 40 MG tablet Take 1 tablet (40  mg total) by mouth daily. 90 tablet 0  . carvedilol (COREG) 25 MG tablet Take 1 tablet (25 mg total) by mouth 2 (two) times daily with a meal. 90 tablet 0  . cloNIDine (CATAPRES) 0.1 MG tablet Take 1 tablet (0.1 mg total) by mouth 2 (two) times daily. 60 tablet 11  . levETIRAcetam (KEPPRA) 500 MG tablet Take 1 tablet (500 mg total) by mouth 2 (two) times daily. 180 tablet 3  . lisinopril (ZESTRIL) 5 MG tablet Take 1 tablet (5 mg total) by mouth daily. 90 tablet 0  . omeprazole (PRILOSEC OTC) 20 MG tablet Take 20 mg by mouth daily.      No facility-administered medications prior to visit.    Allergies  Allergen Reactions  . Percocet [Oxycodone-Acetaminophen] Itching    Pt says he has previously tolerated.     ROS Review of Systems  Constitutional: Negative.   Respiratory: Negative.    Cardiovascular: Negative.   Gastrointestinal: Negative.   Genitourinary: Negative.   Musculoskeletal: Negative.   Allergic/Immunologic: Negative for immunocompromised state.  Neurological: Positive for weakness. Negative for seizures, speech difficulty and numbness.  Hematological: Does not bruise/bleed easily.  Psychiatric/Behavioral: Negative.       Objective:    Physical Exam Vitals and nursing note reviewed.  Constitutional:      General: He is not in acute distress.    Appearance: Normal appearance. He is normal weight. He is not ill-appearing, toxic-appearing or diaphoretic.  HENT:     Head: Normocephalic and atraumatic.     Right Ear: External ear normal.     Left Ear: External ear normal.  Eyes:     General: No scleral icterus.       Right eye: No discharge.        Left eye: No discharge.     Extraocular Movements: Extraocular movements intact.     Conjunctiva/sclera: Conjunctivae normal.     Pupils: Pupils are equal, round, and reactive to light.  Cardiovascular:     Rate and Rhythm: Normal rate and regular rhythm.  Pulmonary:     Effort: Pulmonary effort is normal.  Musculoskeletal:     Cervical back: No rigidity or tenderness.  Lymphadenopathy:     Cervical: No cervical adenopathy.  Skin:    General: Skin is warm and dry.  Neurological:     Mental Status: He is alert and oriented to person, place, and time.  Psychiatric:        Mood and Affect: Mood normal.        Behavior: Behavior normal.     BP 110/72   Pulse 81   Temp (!) 97.3 F (36.3 C) (Tympanic)   Ht 5\' 7"  (1.702 m)   Wt 141 lb (64 kg)   SpO2 96%   BMI 22.08 kg/m  Wt Readings from Last 3 Encounters:  03/16/20 141 lb (64 kg)  03/09/20 139 lb (63 kg)  01/25/20 139 lb (63 kg)     Health Maintenance Due  Topic Date Due  . Hepatitis C Screening  Never done  . TETANUS/TDAP  Never done    There are no preventive care reminders to display for this patient.  No results found for:  TSH Lab Results  Component Value Date   WBC 6.8 01/10/2020   HGB 12.1 (L) 01/10/2020   HCT 39.0 01/10/2020   MCV 85.9 01/10/2020   PLT 215 01/10/2020   Lab Results  Component Value Date   NA 141 01/10/2020   K 4.0  01/10/2020   CO2 28 01/10/2020   GLUCOSE 92 01/10/2020   BUN 11 01/10/2020   CREATININE 1.27 (H) 01/10/2020   BILITOT 0.6 12/21/2019   ALKPHOS 42 12/21/2019   AST 19 12/21/2019   ALT 32 12/21/2019   PROT 6.7 12/21/2019   ALBUMIN 3.5 12/21/2019   CALCIUM 9.7 01/10/2020   ANIONGAP 10 01/10/2020   Lab Results  Component Value Date   CHOL 232 (H) 12/06/2019   Lab Results  Component Value Date   HDL 42 12/06/2019   Lab Results  Component Value Date   LDLCALC UNABLE TO CALCULATE IF TRIGLYCERIDE OVER 400 mg/dL 12/06/2019   Lab Results  Component Value Date   TRIG 294 (H) 12/13/2019   Lab Results  Component Value Date   CHOLHDL 5.5 12/06/2019   Lab Results  Component Value Date   HGBA1C 5.6 12/06/2019      Assessment & Plan:   Problem List Items Addressed This Visit      Cardiovascular and Mediastinum   Essential hypertension - Primary   Relevant Orders   CBC   Comprehensive metabolic panel   Urinalysis, Routine w reflex microscopic     Other   History of CVA (cerebrovascular accident)    Other Visit Diagnoses    Hyperlipidemia associated with type 2 diabetes mellitus ([place])       Relevant Orders   LDL cholesterol, direct   Lipid panel      No orders of the defined types were placed in this encounter.   Follow-up: No follow-ups on file.   He will taper clonidine over 3 weeks.  He will continue to check and record blood pressures.  He will let me know if it starts averaging over 140/90.  We can always increase the Zestril.  Would like to move away from the combination of alpha blockade with beta blockade.  Will return fasting at his convenience for above ordered blood work. Libby Maw, MD

## 2020-03-16 NOTE — Patient Instructions (Signed)
   Managing Your Hypertension Hypertension is commonly called high blood pressure. This is when the force of your blood pressing against the walls of your arteries is too strong. Arteries are blood vessels that carry blood from your heart throughout your body. Hypertension forces the heart to work harder to pump blood, and may cause the arteries to become narrow or stiff. Having untreated or uncontrolled hypertension can cause heart attack, stroke, kidney disease, and other problems. What are blood pressure readings? A blood pressure reading consists of a higher number over a lower number. Ideally, your blood pressure should be below 120/80. The first ("top") number is called the systolic pressure. It is a measure of the pressure in your arteries as your heart beats. The second ("bottom") number is called the diastolic pressure. It is a measure of the pressure in your arteries as the heart relaxes. What does my blood pressure reading mean? Blood pressure is classified into four stages. Based on your blood pressure reading, your health care provider may use the following stages to determine what type of treatment you need, if any. Systolic pressure and diastolic pressure are measured in a unit called mm Hg. Normal  Systolic pressure: below 120.  Diastolic pressure: below 80. Elevated  Systolic pressure: 120-129.  Diastolic pressure: below 80. Hypertension stage 1  Systolic pressure: 130-139.  Diastolic pressure: 80-89. Hypertension stage 2  Systolic pressure: 140 or above.  Diastolic pressure: 90 or above. What health risks are associated with hypertension? Managing your hypertension is an important responsibility. Uncontrolled hypertension can lead to:  A heart attack.  A stroke.  A weakened blood vessel (aneurysm).  Heart failure.  Kidney damage.  Eye damage.  Metabolic syndrome.  Memory and concentration problems. What changes can I make to manage my  hypertension? Hypertension can be managed by making lifestyle changes and possibly by taking medicines. Your health care provider will help you make a plan to bring your blood pressure within a normal range. Eating and drinking   Eat a diet that is high in fiber and potassium, and low in salt (sodium), added sugar, and fat. An example eating plan is called the DASH (Dietary Approaches to Stop Hypertension) diet. To eat this way: ? Eat plenty of fresh fruits and vegetables. Try to fill half of your plate at each meal with fruits and vegetables. ? Eat whole grains, such as whole wheat pasta, brown rice, or whole grain bread. Fill about one quarter of your plate with whole grains. ? Eat low-fat diary products. ? Avoid fatty cuts of meat, processed or cured meats, and poultry with skin. Fill about one quarter of your plate with lean proteins such as fish, chicken without skin, beans, eggs, and tofu. ? Avoid premade and processed foods. These tend to be higher in sodium, added sugar, and fat.  Reduce your daily sodium intake. Most people with hypertension should eat less than 1,500 mg of sodium a day.  Limit alcohol intake to no more than 1 drink a day for nonpregnant women and 2 drinks a day for men. One drink equals 12 oz of beer, 5 oz of wine, or 1 oz of hard liquor. Lifestyle  Work with your health care provider to maintain a healthy body weight, or to lose weight. Ask what an ideal weight is for you.  Get at least 30 minutes of exercise that causes your heart to beat faster (aerobic exercise) most days of the week. Activities may include walking, swimming, or biking.    Include exercise to strengthen your muscles (resistance exercise), such as weight lifting, as part of your weekly exercise routine. Try to do these types of exercises for 30 minutes at least 3 days a week.  Do not use any products that contain nicotine or tobacco, such as cigarettes and e-cigarettes. If you need help quitting,  ask your health care provider.  Control any long-term (chronic) conditions you have, such as high cholesterol or diabetes. Monitoring  Monitor your blood pressure at home as told by your health care provider. Your personal target blood pressure may vary depending on your medical conditions, your age, and other factors.  Have your blood pressure checked regularly, as often as told by your health care provider. Working with your health care provider  Review all the medicines you take with your health care provider because there may be side effects or interactions.  Talk with your health care provider about your diet, exercise habits, and other lifestyle factors that may be contributing to hypertension.  Visit your health care provider regularly. Your health care provider can help you create and adjust your plan for managing hypertension. Will I need medicine to control my blood pressure? Your health care provider may prescribe medicine if lifestyle changes are not enough to get your blood pressure under control, and if:  Your systolic blood pressure is 130 or higher.  Your diastolic blood pressure is 80 or higher. Take medicines only as told by your health care provider. Follow the directions carefully. Blood pressure medicines must be taken as prescribed. The medicine does not work as well when you skip doses. Skipping doses also puts you at risk for problems. Contact a health care provider if:  You think you are having a reaction to medicines you have taken.  You have repeated (recurrent) headaches.  You feel dizzy.  You have swelling in your ankles.  You have trouble with your vision. Get help right away if:  You develop a severe headache or confusion.  You have unusual weakness or numbness, or you feel faint.  You have severe pain in your chest or abdomen.  You vomit repeatedly.  You have trouble breathing. Summary  Hypertension is when the force of blood pumping  through your arteries is too strong. If this condition is not controlled, it may put you at risk for serious complications.  Your personal target blood pressure may vary depending on your medical conditions, your age, and other factors. For most people, a normal blood pressure is less than 120/80.  Hypertension is managed by lifestyle changes, medicines, or both. Lifestyle changes include weight loss, eating a healthy, low-sodium diet, exercising more, and limiting alcohol. This information is not intended to replace advice given to you by your health care provider. Make sure you discuss any questions you have with your health care provider. Document Revised: 09/25/2018 Document Reviewed: 05/01/2016 Elsevier Patient Education  2020 [employer] Inc.  

## 2020-03-17 NOTE — Addendum Note (Signed)
Addended by: Arliss Journey on: 03/17/2020 12:53 PM   Modules accepted: Orders

## 2020-03-20 ENCOUNTER — Ambulatory Visit: Payer: BC Managed Care – PPO | Attending: Physician Assistant | Admitting: Occupational Therapy

## 2020-03-20 ENCOUNTER — Other Ambulatory Visit: Payer: Self-pay

## 2020-03-20 DIAGNOSIS — R2689 Other abnormalities of gait and mobility: Secondary | ICD-10-CM | POA: Insufficient documentation

## 2020-03-20 DIAGNOSIS — R41842 Visuospatial deficit: Secondary | ICD-10-CM | POA: Diagnosis not present

## 2020-03-20 DIAGNOSIS — R29818 Other symptoms and signs involving the nervous system: Secondary | ICD-10-CM | POA: Diagnosis not present

## 2020-03-20 DIAGNOSIS — R208 Other disturbances of skin sensation: Secondary | ICD-10-CM | POA: Diagnosis not present

## 2020-03-20 DIAGNOSIS — R41841 Cognitive communication deficit: Secondary | ICD-10-CM | POA: Diagnosis not present

## 2020-03-20 DIAGNOSIS — R414 Neurologic neglect syndrome: Secondary | ICD-10-CM | POA: Diagnosis not present

## 2020-03-20 DIAGNOSIS — I69354 Hemiplegia and hemiparesis following cerebral infarction affecting left non-dominant side: Secondary | ICD-10-CM | POA: Insufficient documentation

## 2020-03-20 DIAGNOSIS — R2681 Unsteadiness on feet: Secondary | ICD-10-CM | POA: Insufficient documentation

## 2020-03-20 DIAGNOSIS — M6281 Muscle weakness (generalized): Secondary | ICD-10-CM | POA: Insufficient documentation

## 2020-03-20 NOTE — Patient Instructions (Signed)
Aquatic Therapy: What to Expect!  Where:  [facility] [address] [place], [place]  [address] [phone]  NOTE:  Dennis Bast will receive an automated phone message reminding you of your appointment and it will say the appointment is at the [facility] on [address]  We are working to fix this- just know that you will meet [place] at the pool!  How to Prepare: . Please make sure you drink 8 ounces of water about one hour prior to your pool session . A caregiver MUST attend the entire session with the patient.  The caregiver will be responsible for assisting with dressing as well as any toileting needs.  If the patient will be doing a home program this should likely be the person who will assist as well.  . Patients must wear either their street shoes or pool shoes until they are ready to enter the pool with the therapist.  Patients must also wear either street shoes or pool shoes once exiting the pool to walk to the locker room.  This will helps [place] prevent slips and falls.  . Please arrive 15 minutes early to prepare for your pool therapy session . Sign in at the front desk on the clipboard marked for [place] . You may use the locker rooms on your right and then enter directly into the recreation pool (NOT the competition pool) . Please make sure to attend to any toileting needs prior to entering the pool . Please be dressed in your swim suit and on the pool deck at least 5 minutes before your appointment . Once on the pool deck your therapist will ask you to sign the Patient  Consent and Assignment of Benefits form . Your therapist may take your blood pressure prior to, during and after your session if indicated  About the pool  and parking: 1. Entering the pool Your therapist will assist you; there are multiple ways to enter including stairs with railings, a walk in ramp, a roll in chair and a mechanical lift. Your therapist will determine the most appropriate way for  you. 2. Water temperature is usually between 86-87 degrees 3. There may be other swimmers in the pool at the same time 4. Parking is free: if you arrive and there is a parking attendant please inform them you are there for therapy and do not pay to park. Handicapped parking is located at the front entrance.  Contact Info:     Appointments: [facility]  All sessions are 45 minutes   [address]     Please call the [facility] if   [place], [place]   [address]    you need to cancel or reschedule an appointment.  [phone]           Aquatic Therapy: What to Expect!  Where:  [place]           NOTE: You will receive an automated phone message [place]           reminding you of your appointment and it will say the  [place], [place]  [address]           appointment is at the [facility] on [address]. We are  [phone]             working to fix this - just know that you will meet [place] at  pool!   How to Prepare: . Please make sure you drink 8 ounces of water about one hour prior to your pool session . A caregiver must attend the entire session with the patient (unless your primary therapists feels this is not necessary). The caregiver will be responsible for assisting with dressing as well as any toileting needs.  . Please arrive IN YOUR SUIT and a few minutes prior to your appointment - this helps to avoid delays in starting your session. . Please make sure to attend to any toileting needs prior to entering the pool . Once on the pool deck your therapist will ask you to sign the Patient  Consent and Assignment of Benefits form . Your therapist may take your blood pressure prior to, during and after your session if indicated . We usually try and create a home exercise program based on activities we do in the pool.  Please be thinking about who might be able to assist you in the pool should  you want to participate in an aquatic home exercise program at the time of discharge.  Some patients do not want to or do not have the ability to participate in an aquatic home program - this is not a barrier in any way to you participating in aquatic therapy as part of your current therapy plan!    About the pool: 5. Entering the pool Your therapist will assist you; there are multiple ways to enter including stairs with railings, a walk in ramp, a roll in chair and a mechanical lift. Your therapist will determine the most appropriate way for you. 6. Water temperature is usually between 86-87 degrees 7. There may be other swimmers in the pool at the same time     Contact Info:             Appointments: [facility]         All sessions are 45 minutes   [place] [address]            Please call the [facility] if   [place], [place]  [address]           you need to cancel or reschedule an appointment.  [place]           Guido Sander, PT    Forde Radon, OTR/L    12/08/19

## 2020-03-20 NOTE — Therapy (Signed)
[facility] [address] [place], [place], [address] Phone: [phone]   Fax:  [phone]  Occupational Therapy Treatment  Patient Details  Name: Dennis Mercado MRN: [id number] Date of Birth: Jan 19, 1981 Referring Provider (OT): Dr. Letta Pate   Encounter Date: 03/20/2020   OT End of Session - 03/20/20 1222    Visit Number 10    Number of Visits 25   POC written for 12 weeks, 2x week will need to adjust freq due to insurance   Date for OT Re-Evaluation 04/20/20    Authorization Type BCBS    Authorization Time Period 90 days- 30 VL combined for OT/ PT, will adjust frequency accordingly    Authorization - Visit Number 10    Authorization - Number of Visits 15    OT Start Time 1100    OT Stop Time 1145    OT Time Calculation (min) 45 min    Activity Tolerance Patient tolerated treatment well    Behavior During Therapy [facility] for tasks assessed/performed           No past medical history on file.  No past surgical history on file.  There were no vitals filed for this visit.   Subjective Assessment - 03/20/20 1122    Patient is accompanied by: Family member    Pertinent History Pt. presented to ED on 12/06/2019 with left side weakness and  altered mental status s/p coitus.  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 Pt required intubation 6/22 due to ARF secondary to aspiration PNA. Received inpatient rehab. Pt was discharged 7/28/2    Limitations HTN, left inattention, apraxia    Patient Stated Goals to be able to use my hand    Currently in Pain? No/denies          Added padding to walker splint strap for increased comfort and to prevent skin breakdown. Adjusted angle of walker splint. Began discussion of aquatic therapy and issued handout. Discussed conserving 4 visits for aquatic therapy (if insurance covers).  Wt bearing  over BUE's in standing with A/P wt shifts and lateral wt shifts. Progressed to sit to stand transitions while wt bearing over BUE's and focus on keeping weight evenly b/t LE's and UE's. Pt with decreased spasticity after session.                       OT Education - 03/20/20 1221    Education Details Info re: aquatics therapy    Person(s) Educated Patient;Spouse    Methods Explanation;Handout    Comprehension Verbalized understanding            OT Short Term Goals - 02/28/20 1345      OT SHORT TERM GOAL #1   Title I with inital HEP    Time 6    Period Weeks    Status Achieved      OT SHORT TERM GOAL #2   Title Pt will donn shirt with supervision and min v.c, and pants with min A    Time 6    Period Weeks    Status Achieved      OT SHORT TERM GOAL #3   Title Pt will attend to items in L hemibody space with no more than min v.c    Time 6    Period Weeks    Status Achieved      OT SHORT TERM GOAL #4  Title Pt will use LUE as stabilizer 10%of the time with min v.c    Time 6    Period Weeks    Status On-going      OT SHORT TERM GOAL #5   Title Pt will transfer to toilet with close supervison    Time 6    Period Weeks    Status Achieved             OT Long Term Goals - 03/06/20 1020      OT LONG TERM GOAL #1   Title I with updated HEP    Time 12    Period Weeks    Status New      OT LONG TERM GOAL #2   Title Pt will donn shirt mod I and perfrom LB dressing with min A    Time 12    Period Weeks    Status Achieved      OT LONG TERM GOAL #3   Title Pt. will bathe with min A    Time 12    Period Weeks    Status Achieved   distant supervision     OT LONG TERM GOAL #4   Title Pt will demonstrate 40* A/ROM shoulder flexion in prep for reach    Time 12    Period Weeks    Status New      OT LONG TERM GOAL #5   Title Pt will perform tabletop scanning activities with 75%better accuracy    Time 12    Period Weeks    Status New      OT  LONG TERM GOAL #6   Title Pt will demonstrate 50% elbow flexion/ extension in LUE  for functional use.    Time 12    Period Weeks    Status New                 Plan - 03/20/20 1223    Clinical Impression Statement Pt with decreased spasticity LUE after wt bearing activities.    OT Occupational Profile and History Detailed Assessment- Review of Records and additional review of physical, cognitive, psychosocial history related to current functional performance    Occupational performance deficits (Please refer to evaluation for details): ADL's;IADL's;Leisure;Social Participation;Work;Play    Body Structure / Function / Physical Skills ADL;Balance;Endurance;Mobility;Strength;UE functional use;FMC;Vision;Coordination;Gait;Decreased knowledge of precautions;GMC;ROM;Decreased knowledge of use of DME;Dexterity;IADL;Sensation    Cognitive Skills Attention;Memory;Problem Solve;Safety Awareness;Thought;Understand    Rehab Potential Fair    Clinical Decision Making Limited treatment options, no task modification necessary    Comorbidities Affecting Occupational Performance: May have comorbidities impacting occupational performance    Modification or Assistance to Complete Evaluation  No modification of tasks or assist necessary to complete eval    OT Frequency 2x / week   plus eval   OT Duration 12 weeks    OT Treatment/Interventions Self-care/ADL training;Therapeutic exercise;Functional Mobility Training;Balance training;Splinting;Manual Therapy;Neuromuscular education;Aquatic Therapy;Ultrasound;Energy conservation;Therapeutic activities;Cryotherapy;Paraffin;DME and/or AE instruction;Cognitive remediation/compensation;Visual/perceptual remediation/compensation;Gait Training;Fluidtherapy;Electrical Stimulation;Moist Heat;Contrast Bath;Passive range of motion;Patient/family education    Plan Continue NMR LUE/Trunk, pt to tentatively begin aquatic therapy 04/10/20    Consulted and Agree with Plan of  Care Patient;Family member/caregiver           Patient will benefit from skilled therapeutic intervention in order to improve the following deficits and impairments:   Body Structure / Function / Physical Skills: ADL, Balance, Endurance, Mobility, Strength, UE functional use, FMC, Vision, Coordination, Gait, Decreased knowledge of precautions, GMC, ROM, Decreased knowledge of use of DME,  Dexterity, IADL, Sensation Cognitive Skills: Attention, Memory, Problem Solve, Safety Awareness, Thought, Understand     Visit Diagnosis: Hemiplegia and hemiparesis following cerebral infarction affecting left non-dominant side (HCC)  Other symptoms and signs involving the nervous system    Problem List Patient Active Problem List   Diagnosis Date Noted  . History of CVA (cerebrovascular accident) 01/19/2020  . Normocytic anemia   . Benign essential HTN   . Hyponatremia   . Acute blood loss anemia   . Hypotension due to drugs   . Vascular headache   . Prerenal azotemia   . Sleep disturbance   . Leukocytosis   . Transaminitis   . Marijuana abuse   . Essential hypertension   . Dysphagia, post-stroke   . Left hemiparesis ([place])   . Acute respiratory failure ([place])   . ICH (intracerebral hemorrhage) ([place]) 12/06/2019    Carey Bullocks, OTR/L 03/20/2020, 12:25 PM  [facility] [address] [place], [place], [address] Phone: [phone]   Fax:  [phone]  Name: Tiegan Terpstra MRN: [id number] Date of Birth: [birth year]

## 2020-03-22 ENCOUNTER — Other Ambulatory Visit: Payer: Self-pay

## 2020-03-22 ENCOUNTER — Ambulatory Visit: Payer: BC Managed Care – PPO

## 2020-03-22 ENCOUNTER — Ambulatory Visit: Payer: BC Managed Care – PPO | Admitting: Physical Therapy

## 2020-03-22 DIAGNOSIS — R41842 Visuospatial deficit: Secondary | ICD-10-CM | POA: Diagnosis not present

## 2020-03-22 DIAGNOSIS — I69354 Hemiplegia and hemiparesis following cerebral infarction affecting left non-dominant side: Secondary | ICD-10-CM | POA: Diagnosis not present

## 2020-03-22 DIAGNOSIS — R208 Other disturbances of skin sensation: Secondary | ICD-10-CM | POA: Diagnosis not present

## 2020-03-22 DIAGNOSIS — R41841 Cognitive communication deficit: Secondary | ICD-10-CM

## 2020-03-22 DIAGNOSIS — M6281 Muscle weakness (generalized): Secondary | ICD-10-CM

## 2020-03-22 DIAGNOSIS — R2689 Other abnormalities of gait and mobility: Secondary | ICD-10-CM

## 2020-03-22 DIAGNOSIS — R2681 Unsteadiness on feet: Secondary | ICD-10-CM

## 2020-03-22 DIAGNOSIS — R414 Neurologic neglect syndrome: Secondary | ICD-10-CM | POA: Diagnosis not present

## 2020-03-22 DIAGNOSIS — R29818 Other symptoms and signs involving the nervous system: Secondary | ICD-10-CM

## 2020-03-22 NOTE — Therapy (Addendum)
[facility] [address] [place], [place], [address] Phone: [phone]   Fax:  [phone]  Physical Therapy Treatment  Patient Details  Name: Dennis Mercado MRN: [id number] Date of Birth: [birth year] Referring Provider (PT): Angiulli, Chapman Fitch (will be followed by Dr. Letta Pate)   Encounter Date: 03/22/2020   03/22/20 1219  PT Visits / Re-Eval  Visit Number 11  Number of Visits 15  Date for PT Re-Evaluation 04/28/20  Authorization  Authorization Type BCBS, VL for PT and OT = 30 (zero used, if seen on same day, will count as 2 visits), VL for ST = 30  Authorization - Visit Number 11  Authorization - Number of Visits 15  PT Time Calculation  PT Start Time 0758  PT Stop Time 0845  PT Time Calculation (min) 47 min  PT - End of Session  Equipment Utilized During Treatment Gait belt  Activity Tolerance Patient tolerated treatment well  Behavior During Therapy [facility] for tasks assessed/performed     No past medical history on file.  No past surgical history on file.  There were no vitals filed for this visit.      03/22/20 0807  Symptoms/Limitations  Subjective No changes, no falls.  Pertinent History HTN  Patient Stated Goals wants to start walking as soon as possible - "less walker and downgrade to a cane and then nothing at all"  Pain Assessment  Currently in Pain? No/denies             03/22/20 1222  Ambulation/Gait  Ambulation/Gait Yes  Ambulation/Gait Assistance 5: Supervision  Ambulation/Gait Assistance Details into clinic - manual cues from therapist at pt's L ASIS for incr pelvic protraction during swing phase  Ambulation Distance (Feet) 115 Feet  Assistive device Rolling walker (L AFO, L hand orthosis)  Gait Pattern Decreased step length - right;Decreased stance time - left;Decreased hip/knee flexion - left;Decreased weight shift to left;Left flexed knee in stance;Narrow base of  support;Poor foot clearance - left;Step-through pattern  Ambulation Surface Level;Indoor  Self-Care  Self-Care Other Self-Care Comments  Other Self-Care Comments  provided handouts to pt and pt's spouse for pro bono PT clinics in the area from local PT schools for pt to attend when insurance coverance lapses  Neuro Re-ed   Neuro Re-ed Details  Got into tall kneeling position: needing single UE support on chair and min guard when turning to face the table, needing min A and verbal cues to shift to LLE and bring RLE onto mat with RUE support, initial facilitation for tall kneeling crawling to get closer to [facility] bench. Put bowl under LUE for incr weight bearing, with PT #1 helping with balance and making sure there is weight bearing through LUE, 2nd therapist to assist with balance/exercise tasks. Performed x6 reps tall kneeling mini squats with no UE support, then performed x10 reps tall kneeling mini squats with green tband resistance around ASIS provided by 2nd therapist, then performed x5 reps with bias towards L leg when kneeling. For incr weight bearing/weight shifting towards LLE, performed x8 reps side steps with RLE out and in, then progressed to use of green tband around distal thighs for closed chain hip ABD strengthening, performed x10 reps. With 2nd therapist holding cone out to R, pt reaching outside of BOS to grab cone and then reach over to his L for incr weight bearing/shifting to hand cone back to therapist, initial min A from 1st therapist for balance, pt improving with incr reps. Performed 2  x 10 reps. Pt tolerated tall kneeling position well with no reports of discomfort   Knee/Hip Exercises: Supine  Heel Slides Strengthening;AROM;Left;10 reps  Heel Slides Limitations intermittent assist from therapist for LLE to stay in proper position and for heel to stay on mat table, verbal cues for eccentric control when kicking leg out  Other Supine Knee/Hip Exercises with LLE on red physioball -  performed x10 reps knee flexion, with cues for performing a heel dig first into ball, therapist helping to facilitate motion of ball into knee flexion at times                       PT Short Term Goals - 02/25/20 1427      PT SHORT TERM GOAL #1   Title Pt and pt's spouse will be independent with initial HEP in order to build upon functional gains made in therapy. ALL STGS DUE 02/15/20    Time 4    Period Weeks    Status Achieved    Target Date 02/15/20      PT SHORT TERM GOAL #2   Title Pt will perform squat pivot vs. stand pivot with supervision in order to improve functional mobility and decr caregiver burden.    Baseline squat pivot with min guard, stand pivot with min A    Time 4    Period Weeks    Status Not Met      PT SHORT TERM GOAL #3   Title Pt will perform sit <> stands with RW with supervision and pt able to place LUE in L hand orthosis with supervision in order to improve functional mobility.    Baseline supervision/min guard    Time 4    Period Weeks    Status Partially Met      PT SHORT TERM GOAL #4   Title Pt will ambulate at least 100' with RW and min guard in order to improve household mobility.    Time 4    Period Weeks    Status Achieved      PT SHORT TERM GOAL #5   Title Pt will perform standing balance with single UE support at sink for 5 minutes with supervision in order to incr tolerance for ADLs.    Baseline 5 minutes for dynamic tasks with single/no UE support at sink    Time 4    Period Weeks    Status Achieved             PT Long Term Goals - 03/17/20 1249      PT LONG TERM GOAL #1   Title Pt and pt's spouse will be independent with final HEP in order to build upon functional gains made in therapy. ALL LTGS DUE 04/21/20    Baseline pt reports consistently performing current HEP - will continue to benefit from further additions    Time 5    Period Weeks    Status On-going    Target Date 04/21/20      PT LONG TERM GOAL #2    Title Pt will ambulate at least 230' with RW vs. LRAD and supervision over unlevel surfaces in order to improve functional mobility.    Baseline ambulates wth RW with supervision/min guard over level surfaces    Time 5    Period Weeks    Status On-going      PT LONG TERM GOAL #3   Title Gait speed to be assessed with LTG to be written  as appropriate in order to demo improved gait efficiency.    Time 5    Period Weeks    Status New      PT LONG TERM GOAL #4   Title Pt will perform 2 steps with single HHA vs. LRAD with min guard in order to safely enter home.    Baseline not yet assessed.    Time 5    Period Weeks    Status On-going      PT LONG TERM GOAL #5   Title Pt will perform dynamic standing activites for 5 minutes with no UE support with min guard in order to demo improved balance for ADLs.    Time 5    Period Weeks    Status New                 Plan - 03/24/20 0757    Clinical Impression Statement Focus of today's skilled session was getting pt into tall kneeling position for strengthening, NMR for balance and weight shifting/bearing over LLE. Pt tolerated session well. Needed initial min A for weight shifting/balance exercises towards L side side, pt improving with incr reps. Will continue to progress towards LTGs.    Personal Factors and Comorbidities Comorbidity 1;Past/Current Experience;Age;Profession    Comorbidities HTN    Examination-Activity Limitations Bathing;Bed Mobility;Carry;Hygiene/Grooming;Stand;Stairs;Squat;Transfers;Locomotion Level    Examination-Participation Restrictions Community Activity;Occupation   walking his dog   Stability/Clinical Decision Making Evolving/Moderate complexity    Rehab Potential Good    PT Frequency 1x / week    PT Duration --   5 WEEKS   PT Treatment/Interventions ADLs/Self Care Home Management;Aquatic Therapy;Electrical Stimulation;DME Instruction;Gait training;Stair training;Functional mobility training;Therapeutic  activities;Therapeutic exercise;Balance training;Neuromuscular re-education;Wheelchair mobility training;Orthotic Fit/Training;Patient/family education;Passive range of motion;Energy conservation;Vestibular    PT Next Visit Plan gait training with cane, tall kneeling, step ups with LLE, NMR for weight shifting through LLE and LUE    PT Home Exercise Plan [id number]    Consulted and Agree with Plan of Care Patient;Family member/caregiver    Family Member Consulted wife, April           Patient will benefit from skilled therapeutic intervention in order to improve the following deficits and impairments:  Abnormal gait, Decreased activity tolerance, Decreased balance, Decreased mobility, Decreased knowledge of use of DME, Decreased coordination, Decreased range of motion, Decreased strength, Difficulty walking, Impaired tone, Impaired sensation, Postural dysfunction  Visit Diagnosis: Other symptoms and signs involving the nervous system  Unsteadiness on feet  Muscle weakness (generalized)  Other abnormalities of gait and mobility  Hemiplegia and hemiparesis following cerebral infarction affecting left non-dominant side [facility])     Problem List Patient Active Problem List   Diagnosis Date Noted  . History of CVA (cerebrovascular accident) 01/19/2020  . Normocytic anemia   . Benign essential HTN   . Hyponatremia   . Acute blood loss anemia   . Hypotension due to drugs   . Vascular headache   . Prerenal azotemia   . Sleep disturbance   . Leukocytosis   . Transaminitis   . Marijuana abuse   . Essential hypertension   . Dysphagia, post-stroke   . Left hemiparesis ([place])   . Acute respiratory failure ([place])   . ICH (intracerebral hemorrhage) ([place]) 12/06/2019    Arliss Journey, PT, DPT  03/24/2020, 7:58 AM  [facility] [address] [place], [place], [address] Phone: [phone]   Fax:  [phone]  Name: Dennis Mercado MRN: [id number] Date  of Birth: 05/18/1981

## 2020-03-22 NOTE — Therapy (Signed)
[facility] [address] [place], [place], [address] Phone: [phone]   Fax:  [phone]  Speech Language Pathology Treatment  Patient Details  Name: Dennis Mercado MRN: [id number] Date of Birth: 1980-12-07 Referring Provider (SLP): Dennis Mercado, [place]   Encounter Date: 03/22/2020   End of Session - 03/22/20 1127    Visit Number 13    Number of Visits 25    Date for SLP Re-Evaluation 04/20/20    Authorization Type 30 visit limit ST, 30 visits combined for PT/OT    Authorization - Visit Number 12    Authorization - Number of Visits 30    SLP Start Time [phone]    SLP Stop Time  0915    SLP Time Calculation (min) 29 min    Activity Tolerance Patient tolerated treatment well           History reviewed. No pertinent past medical history.  History reviewed. No pertinent surgical history.  There were no vitals filed for this visit.   Subjective Assessment - 03/22/20 0851    Subjective "Do you bill something other than a speech pathologist?"    Patient is accompained by: Family member   wife - Dennis Mercado   Currently in Pain? No/denies                 ADULT SLP TREATMENT - 03/22/20 0939      General Information   Behavior/Cognition Alert;Cooperative;Pleasant mood      Treatment Provided   Treatment provided Cognitive-Linquistic      Cognitive-Linquistic Treatment   Treatment focused on Cognition    Skilled Treatment Re: pt's "S" statement - Dennis Mercado reports he spent 45 minutes on the phone with both health system and insurance trying to ascertain why speech therapy charges were different in August vs. September. Conclusion was that they were coded differently in each of those months so Dennis Mercado said that the charges will be recoded and resubmitted. (memory, executive function, reasoning). Pt planned a week long trip to [place] reports he had to switch the arrival times due to rental cars unavailable  at the original desired arrival time. Pt planned sights that were handicap accessible in case pt uses wheelchair, and he also planned for $200 add-on for flight assistance - transportation from gate to gate for each flight. Planned for food which included staying at a breakfast-included hotel. Pt nor wife report any difficulties with pt cognitive lingjuistic skills at home. Pt told SLP his next scheduled appointment with SLP was 04-05-20 (correct) without his schedule.       Assessment / Recommendations / Plan   Plan Continue with current plan of care   possible d/c next visit     Progression Toward Goals   Progression toward goals Progressing toward goals              SLP Short Term Goals - 03/01/20 1143      SLP SHORT TERM GOAL #1   Title pt will initiate use of memory compensations in 3 sessions    Baseline 02-10-20, 02-14-20, 02-15-20    Period --   or 9 sessions   Status Achieved      SLP SHORT TERM GOAL #2   Title pt will use memory compensations successfully between/in 3 sessions    Baseline 02-10-20, 02-14-20, 02-16-20    Time --   or 13 sessions   Status Achieved      SLP SHORT TERM GOAL #3  Title pt will tell SLP 3 cognitive linguistic deficits in 2 sessions    Time --   or9 sessions   Status Deferred      SLP SHORT TERM GOAL #4   Title pt will demonstrate error awareness in therapy tasks 100% with spontaneous re-check, in 3 sessions    Baseline 02-10-20, 02-14-20, 02-16-20 (breakfast menu)    Time --   or 13 sessions   Status Achieved      SLP SHORT TERM GOAL #5   Title pt will demo selective attention in mod noisy environment for 10 minutes while completing simple-mod complex therapy task/s in 3 sessions    Baseline 02-16-20    Period --   or 13 sessions   Status Partially Met            SLP Long Term Goals - 03/22/20 1134      SLP LONG TERM GOAL #1   Title pt will demonstrate WFL attention and problem solving skills to complete detailed data entry-type tasks (like  payroll) to prepare for return to work 100% with self-corrections in 3 sessions    Baseline 02-10-20, 02-14-20,    Period --   or 25 sessions, for all LTGs   Status Achieved      SLP LONG TERM GOAL #2   Title pt will utilize functional  memory compensations successfully between therapy tasks reported by pt and/or wife between 4 sessions    Baseline 02-16-20, 02-23-20, 03-01-20    Status Achieved      SLP LONG TERM GOAL #3   Title pt will demonstrate divided attention in two simple therapy tasks in 3 sessions    Baseline 03-22-20    Time 1    Period Weeks    Status On-going      SLP LONG TERM GOAL #4   Title pt will use compensations for attention successfully PRN in 2 sessions    Time 4    Period Weeks    Status On-going      SLP LONG TERM GOAL #5   Title pt will demonstrate functional executive function skills in mod- complex/complex planning tasks, accounting for detail management, in three sessions    Baseline 03-15-20, 03-22-20    Time 4    Period Weeks    Status New            Plan - 03/22/20 1132    Clinical Impression Statement Pt presents today with WFL/WNL cognitive linguistic skills demonstrated in higher level executive function tasks of planning a week long vacation with airfare and rental car, and navigating inquiry about healthcare bill via his insurance. Skilled ST remains necessary to ensure consistency with success and to detect any problem areas and assist pt in correcting. Pt likely not need to be seen >3 more sessions. If everything same or better next session pt agrees with d/c.    Speech Therapy Frequency 1x /week    Duration --   12 weeks, or 25 total visits   Treatment/Interventions Internal/external aids;Patient/family education;Compensatory strategies;Cognitive reorganization;Cueing hierarchy;SLP instruction and feedback;Functional tasks;Environmental controls    Potential to Achieve Goals Good    Potential Considerations Cooperation/participation level    awareness          Patient will benefit from skilled therapeutic intervention in order to improve the following deficits and impairments:   Cognitive communication deficit    Problem List Patient Active Problem List   Diagnosis Date Noted  . History of CVA (cerebrovascular accident) 01/19/2020  .  Normocytic anemia   . Benign essential HTN   . Hyponatremia   . Acute blood loss anemia   . Hypotension due to drugs   . Vascular headache   . Prerenal azotemia   . Sleep disturbance   . Leukocytosis   . Transaminitis   . Marijuana abuse   . Essential hypertension   . Dysphagia, post-stroke   . Left hemiparesis ([place])   . Acute respiratory failure ([place])   . ICH (intracerebral hemorrhage) ([place]) 12/06/2019    [facility] ,[place], [place]  03/22/2020, 11:37 AM  [facility] [address] [place], [place], [address] Phone: [phone]   Fax:  [phone]   Name: Dennis Mercado MRN: [id number] Date of Birth: 17-Aug-1980

## 2020-03-26 ENCOUNTER — Other Ambulatory Visit: Payer: Self-pay | Admitting: Family Medicine

## 2020-03-27 ENCOUNTER — Ambulatory Visit: Payer: BC Managed Care – PPO | Admitting: Occupational Therapy

## 2020-03-27 ENCOUNTER — Other Ambulatory Visit: Payer: Self-pay

## 2020-03-28 ENCOUNTER — Other Ambulatory Visit (INDEPENDENT_AMBULATORY_CARE_PROVIDER_SITE_OTHER): Payer: BC Managed Care – PPO

## 2020-03-28 ENCOUNTER — Telehealth: Payer: Self-pay | Admitting: *Deleted

## 2020-03-28 DIAGNOSIS — E785 Hyperlipidemia, unspecified: Secondary | ICD-10-CM | POA: Diagnosis not present

## 2020-03-28 DIAGNOSIS — G8114 Spastic hemiplegia affecting left nondominant side: Secondary | ICD-10-CM

## 2020-03-28 DIAGNOSIS — I10 Essential (primary) hypertension: Secondary | ICD-10-CM | POA: Diagnosis not present

## 2020-03-28 DIAGNOSIS — Z8673 Personal history of transient ischemic attack (TIA), and cerebral infarction without residual deficits: Secondary | ICD-10-CM

## 2020-03-28 DIAGNOSIS — E1169 Type 2 diabetes mellitus with other specified complication: Secondary | ICD-10-CM | POA: Diagnosis not present

## 2020-03-28 LAB — URINALYSIS, ROUTINE W REFLEX MICROSCOPIC
Bilirubin Urine: NEGATIVE
Hgb urine dipstick: NEGATIVE
Ketones, ur: NEGATIVE
Leukocytes,Ua: NEGATIVE
Nitrite: NEGATIVE
RBC / HPF: NONE SEEN (ref 0–?)
Specific Gravity, Urine: 1.015 (ref 1.000–1.030)
Total Protein, Urine: NEGATIVE
Urine Glucose: NEGATIVE
Urobilinogen, UA: 0.2 (ref 0.0–1.0)
WBC, UA: NONE SEEN (ref 0–?)
pH: 7.5 (ref 5.0–8.0)

## 2020-03-28 LAB — CBC
HCT: 40.2 % (ref 39.0–52.0)
Hemoglobin: 13 g/dL (ref 13.0–17.0)
MCHC: 32.3 g/dL (ref 30.0–36.0)
MCV: 82.3 fl (ref 78.0–100.0)
Platelets: 373 10*3/uL (ref 150.0–400.0)
RBC: 4.88 Mil/uL (ref 4.22–5.81)
RDW: 14.9 % (ref 11.5–15.5)
WBC: 7.9 10*3/uL (ref 4.0–10.5)

## 2020-03-28 LAB — COMPREHENSIVE METABOLIC PANEL
ALT: 18 U/L (ref 0–53)
AST: 12 U/L (ref 0–37)
Albumin: 4.9 g/dL (ref 3.5–5.2)
Alkaline Phosphatase: 79 U/L (ref 39–117)
BUN: 16 mg/dL (ref 6–23)
CO2: 31 mEq/L (ref 19–32)
Calcium: 10.4 mg/dL (ref 8.4–10.5)
Chloride: 99 mEq/L (ref 96–112)
Creatinine, Ser: 1.33 mg/dL (ref 0.40–1.50)
GFR: 66.79 mL/min (ref >60.00)
Glucose, Bld: 88 mg/dL (ref 70–99)
Potassium: 4.6 mEq/L (ref 3.5–5.1)
Sodium: 139 mEq/L (ref 135–145)
Total Bilirubin: 0.6 mg/dL (ref 0.2–1.2)
Total Protein: 7.6 g/dL (ref 6.0–8.3)

## 2020-03-28 LAB — LDL CHOLESTEROL, DIRECT: Direct LDL: 68 mg/dL

## 2020-03-28 LAB — LIPID PANEL
Cholesterol: 115 mg/dL (ref 0–200)
HDL: 32.1 mg/dL — ABNORMAL LOW (ref >39.00)
LDL Cholesterol: 66 mg/dL (ref 0–99)
NonHDL: 83.02
Total CHOL/HDL Ratio: 4
Triglycerides: 84 mg/dL (ref 0.0–149.0)
VLDL: 16.8 mg/dL (ref 0.0–40.0)

## 2020-03-28 NOTE — Telephone Encounter (Addendum)
Mr Dennis Mercado called to request a referral to [facility] for PT. Their fax # is [phone].

## 2020-03-29 ENCOUNTER — Ambulatory Visit: Payer: BC Managed Care – PPO | Admitting: Physical Therapy

## 2020-03-29 ENCOUNTER — Other Ambulatory Visit: Payer: Self-pay

## 2020-03-29 DIAGNOSIS — M6281 Muscle weakness (generalized): Secondary | ICD-10-CM | POA: Diagnosis not present

## 2020-03-29 DIAGNOSIS — R29818 Other symptoms and signs involving the nervous system: Secondary | ICD-10-CM

## 2020-03-29 DIAGNOSIS — R2681 Unsteadiness on feet: Secondary | ICD-10-CM

## 2020-03-29 DIAGNOSIS — R41842 Visuospatial deficit: Secondary | ICD-10-CM | POA: Diagnosis not present

## 2020-03-29 DIAGNOSIS — R414 Neurologic neglect syndrome: Secondary | ICD-10-CM | POA: Diagnosis not present

## 2020-03-29 DIAGNOSIS — R2689 Other abnormalities of gait and mobility: Secondary | ICD-10-CM

## 2020-03-29 DIAGNOSIS — R41841 Cognitive communication deficit: Secondary | ICD-10-CM | POA: Diagnosis not present

## 2020-03-29 DIAGNOSIS — R208 Other disturbances of skin sensation: Secondary | ICD-10-CM | POA: Diagnosis not present

## 2020-03-29 DIAGNOSIS — I69354 Hemiplegia and hemiparesis following cerebral infarction affecting left non-dominant side: Secondary | ICD-10-CM | POA: Diagnosis not present

## 2020-03-29 NOTE — Telephone Encounter (Signed)
Please order PT for Left hemiparesis late effect stroke

## 2020-03-29 NOTE — Telephone Encounter (Addendum)
Referral placed. Mr Newman notified.

## 2020-03-29 NOTE — Therapy (Signed)
[facility] [address] [place], [place], [address] Phone: [phone]   Fax:  [phone]  Physical Therapy Treatment  Patient Details  Name: Dennis Mercado MRN: [id number] Date of Birth: 04-08-1981 Referring Provider (PT): Angiulli, Chapman Fitch (will be followed by Dr. Letta Pate)   Encounter Date: 03/29/2020   PT End of Session - 03/29/20 1216    Visit Number 12    Number of Visits 15    Date for PT Re-Evaluation 04/28/20    Authorization Type BCBS, VL for PT and OT = 30 (zero used, if seen on same day, will count as 2 visits), VL for ST = 30    Authorization - Visit Number 12    Authorization - Number of Visits 15    PT Start Time 3009    PT Stop Time 0845    PT Time Calculation (min) 47 min    Equipment Utilized During Treatment Gait belt    Activity Tolerance Patient tolerated treatment well    Behavior During Therapy [facility] for tasks assessed/performed           No past medical history on file.  No past surgical history on file.  There were no vitals filed for this visit.   Subjective Assessment - 03/29/20 0803    Subjective No falls. Reached out to the [facility] and will be getting on the books.    Pertinent History HTN    Patient Stated Goals wants to start walking as soon as possible - "less walker and downgrade to a cane and then nothing at all"    Currently in Pain? No/denies                             [facility] Adult PT Treatment/Exercise - 03/29/20 1220      Transfers   Transfers Sit to Stand;Stand to Sit    Sit to Stand 5: Supervision;Without upper extremity assist    Sit to Stand Details (indicate cue type and reason) between gait and activities in session, with RLE staggered posteriorly    Stand to Sit 5: Supervision    Comments performed x6 reps sit <> stand from slightly elevated mat table with LLE staggered behind R for incr weight shifting/bearing with no UE support,  verbal and demo cues on how to perform, min A/min guard for balance      Ambulation/Gait   Ambulation/Gait Yes    Ambulation/Gait Assistance 4: Min guard    Ambulation/Gait Assistance Details supervision in and out of clinic with RW. Began to practice gait training with small base quad cane, initial verbal/demo cues for technique. Min A initially for safety/balance progressing to light min guard. During first half of gait pt performing 3 pt gait pattern with decr step length with RLE, with cueing and incr reps/distance, pt able to perform with 2 pt pattern with cane and LLE placement at the same time and step through pattern. Cues for wider BOS and placing cane out more laterally, as intermittently pt will tend to place cane too narrow. No LOB throughout gait. Verbal and tactile cues intermittently at L quad for knee extension during stance phase     Ambulation Distance (Feet) 115 Feet   x3   Assistive device Small based quad cane    Gait Pattern Decreased step length - right;Decreased stance time - left;Decreased hip/knee flexion - left;Decreased weight shift to left;Left flexed knee in stance;Narrow base of support;Step-through  pattern;Step-to pattern;Decreased arm swing - left    Ambulation Surface Level;Indoor    Pre-Gait Activities prior to gait: standing with RUE on small base quad cane - stepping RLE forwards and backwards x15 reps with pt demonstrating incr step length each time, verbal and tactile cues for shifting weight to LLE first and for quad activation prior to stepping with RLE. attempted same activity with SPC with quad tip, however too unsteady for pt to perform with at this time    Gait Comments educated on where to purchase a small base quad cane for small distances inside the house only with wife present, demonstrated on how to set for proper height and how to turn it to make sure that legs are pointed on the outside                   PT Education - 03/29/20 1215     Education Details pt safe to do short distance gait with small base quad cane at home with wife - wearing gait belt and making sure that dogs are put away. provided information to pt on where to purchase one for home use.    Person(s) Educated Patient;Spouse    Methods Explanation;Demonstration    Comprehension Verbalized understanding;Returned demonstration            PT Short Term Goals - 02/25/20 1427      PT SHORT TERM GOAL #1   Title Pt and pt's spouse will be independent with initial HEP in order to build upon functional gains made in therapy. ALL STGS DUE 02/15/20    Time 4    Period Weeks    Status Achieved    Target Date 02/15/20      PT SHORT TERM GOAL #2   Title Pt will perform squat pivot vs. stand pivot with supervision in order to improve functional mobility and decr caregiver burden.    Baseline squat pivot with min guard, stand pivot with min A    Time 4    Period Weeks    Status Not Met      PT SHORT TERM GOAL #3   Title Pt will perform sit <> stands with RW with supervision and pt able to place LUE in L hand orthosis with supervision in order to improve functional mobility.    Baseline supervision/min guard    Time 4    Period Weeks    Status Partially Met      PT SHORT TERM GOAL #4   Title Pt will ambulate at least 100' with RW and min guard in order to improve household mobility.    Time 4    Period Weeks    Status Achieved      PT SHORT TERM GOAL #5   Title Pt will perform standing balance with single UE support at sink for 5 minutes with supervision in order to incr tolerance for ADLs.    Baseline 5 minutes for dynamic tasks with single/no UE support at sink    Time 4    Period Weeks    Status Achieved             PT Long Term Goals - 03/17/20 1249      PT LONG TERM GOAL #1   Title Pt and pt's spouse will be independent with final HEP in order to build upon functional gains made in therapy. ALL LTGS DUE 04/21/20    Baseline pt reports  consistently performing current HEP - will continue  to benefit from further additions    Time 5    Period Weeks    Status On-going    Target Date 04/21/20      PT LONG TERM GOAL #2   Title Pt will ambulate at least 230' with RW vs. LRAD and supervision over unlevel surfaces in order to improve functional mobility.    Baseline ambulates wth RW with supervision/min guard over level surfaces    Time 5    Period Weeks    Status On-going      PT LONG TERM GOAL #3   Title Gait speed to be assessed with LTG to be written as appropriate in order to demo improved gait efficiency.    Time 5    Period Weeks    Status New      PT LONG TERM GOAL #4   Title Pt will perform 2 steps with single HHA vs. LRAD with min guard in order to safely enter home.    Baseline not yet assessed.    Time 5    Period Weeks    Status On-going      PT LONG TERM GOAL #5   Title Pt will perform dynamic standing activites for 5 minutes with no UE support with min guard in order to demo improved balance for ADLs.    Time 5    Period Weeks    Status New                 Plan - 03/29/20 1319    Clinical Impression Statement Focus of today's skilled session was pre gait and gait training with small base quad cane. Pt initially performing with min A and progressing to min guard with pt able to perform with proper sequencing. Needing cues intermittently for wider BOS by placing cane out more laterally. discussed with pt and pt's spouse, pt can walk short distances in the house with gait belt and spouse present as long as dogs are put away. Will continue to progress towrads LTGs.    Personal Factors and Comorbidities Comorbidity 1;Past/Current Experience;Age;Profession    Comorbidities HTN    Examination-Activity Limitations Bathing;Bed Mobility;Carry;Hygiene/Grooming;Stand;Stairs;Squat;Transfers;Locomotion Level    Examination-Participation Restrictions Community Activity;Occupation   walking his dog    Stability/Clinical Decision Making Evolving/Moderate complexity    Rehab Potential Good    PT Frequency 1x / week    PT Duration --   5 WEEKS   PT Treatment/Interventions ADLs/Self Care Home Management;Aquatic Therapy;Electrical Stimulation;DME Instruction;Gait training;Stair training;Functional mobility training;Therapeutic activities;Therapeutic exercise;Balance training;Neuromuscular re-education;Wheelchair mobility training;Orthotic Fit/Training;Patient/family education;Passive range of motion;Energy conservation;Vestibular    PT Next Visit Plan gait training with small base quad cane, stair training?, tall kneeling, step ups with LLE, NMR for weight shifting through LLE and LUE    PT Home Exercise Plan [id number]    Consulted and Agree with Plan of Care Patient;Family member/caregiver    Family Member Consulted wife, April           Patient will benefit from skilled therapeutic intervention in order to improve the following deficits and impairments:  Abnormal gait, Decreased activity tolerance, Decreased balance, Decreased mobility, Decreased knowledge of use of DME, Decreased coordination, Decreased range of motion, Decreased strength, Difficulty walking, Impaired tone, Impaired sensation, Postural dysfunction  Visit Diagnosis: Other symptoms and signs involving the nervous system  Unsteadiness on feet  Muscle weakness (generalized)  Other abnormalities of gait and mobility     Problem List Patient Active Problem List   Diagnosis Date Noted  .  History of CVA (cerebrovascular accident) 01/19/2020  . Normocytic anemia   . Benign essential HTN   . Hyponatremia   . Acute blood loss anemia   . Hypotension due to drugs   . Vascular headache   . Prerenal azotemia   . Sleep disturbance   . Leukocytosis   . Transaminitis   . Marijuana abuse   . Essential hypertension   . Dysphagia, post-stroke   . Left hemiparesis ([place])   . Acute respiratory failure ([place])   . ICH  (intracerebral hemorrhage) ([place]) 12/06/2019    Arliss Journey, PT, DPT  03/29/2020, 1:28 PM  [facility] [address] [place], [place], [address] Phone: [phone]   Fax:  [phone]  Name: Brinson Tozzi MRN: [id number] Date of Birth: 1980-11-19

## 2020-04-03 ENCOUNTER — Ambulatory Visit: Payer: BC Managed Care – PPO | Admitting: Occupational Therapy

## 2020-04-03 ENCOUNTER — Other Ambulatory Visit: Payer: Self-pay

## 2020-04-03 DIAGNOSIS — R2681 Unsteadiness on feet: Secondary | ICD-10-CM

## 2020-04-03 DIAGNOSIS — I69354 Hemiplegia and hemiparesis following cerebral infarction affecting left non-dominant side: Secondary | ICD-10-CM | POA: Diagnosis not present

## 2020-04-03 DIAGNOSIS — R41842 Visuospatial deficit: Secondary | ICD-10-CM | POA: Diagnosis not present

## 2020-04-03 DIAGNOSIS — R41841 Cognitive communication deficit: Secondary | ICD-10-CM | POA: Diagnosis not present

## 2020-04-03 DIAGNOSIS — R414 Neurologic neglect syndrome: Secondary | ICD-10-CM | POA: Diagnosis not present

## 2020-04-03 DIAGNOSIS — R29818 Other symptoms and signs involving the nervous system: Secondary | ICD-10-CM

## 2020-04-03 DIAGNOSIS — M6281 Muscle weakness (generalized): Secondary | ICD-10-CM | POA: Diagnosis not present

## 2020-04-03 DIAGNOSIS — R208 Other disturbances of skin sensation: Secondary | ICD-10-CM | POA: Diagnosis not present

## 2020-04-03 DIAGNOSIS — R2689 Other abnormalities of gait and mobility: Secondary | ICD-10-CM | POA: Diagnosis not present

## 2020-04-03 NOTE — Therapy (Signed)
[facility] [address] [place], [place], [address] Phone: [phone]   Fax:  [phone]  Occupational Therapy Treatment  Patient Details  Name: Dennis Mercado MRN: [id number] Date of Birth: January 01, 1981 Referring Provider (OT): Dr. Letta Pate   Encounter Date: 04/03/2020   OT End of Session - 04/03/20 1209    Visit Number 11    Number of Visits 25   POC written for 12 weeks, 2x week will need to adjust freq due to insurance   Date for OT Re-Evaluation 05/01/20    Authorization Type BCBS    Authorization Time Period 90 days- 30 VL combined for OT/ PT, will adjust frequency accordingly    Authorization - Visit Number 11    Authorization - Number of Visits 15    Activity Tolerance Patient tolerated treatment well    Behavior During Therapy [facility] for tasks assessed/performed           No past medical history on file.  No past surgical history on file.  There were no vitals filed for this visit.   Subjective Assessment - 04/03/20 1108    Patient is accompanied by: Family member   wife   Pertinent History Pt. presented to ED on 12/06/2019 with left side weakness and  altered mental status s/p coitus.  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 Pt required intubation 6/22 due to ARF secondary to aspiration PNA. Received inpatient rehab. Pt was discharged 7/28/2    Limitations HTN, left inattention, apraxia    Patient Stated Goals to be able to use my hand    Currently in Pain? No/denies           Reviewed preparation and time for aquatic therapy beginning next week. Pt also scheduled to get botox later this week.   Wt bearing over LUE with diagonal reaching patterns RUE (cross reaching and ipsilateral reaching) with trunk rotation and min facilitation required for Lt wrist extension and triceps activation.  Wt bearing over BUE's  in sit to partial stand and A/P wt shifts w/ cues to keep pelvis aligned and weight evenly distributed.                        OT Short Term Goals - 04/03/20 1210      OT SHORT TERM GOAL #1   Title I with inital HEP    Time 6    Period Weeks    Status Achieved      OT SHORT TERM GOAL #2   Title Pt will donn shirt with supervision and min v.c, and pants with min A    Time 6    Period Weeks    Status Achieved      OT SHORT TERM GOAL #3   Title Pt will attend to items in L hemibody space with no more than min v.c    Time 6    Period Weeks    Status Achieved      OT SHORT TERM GOAL #4   Title Pt will use LUE as stabilizer 10%of the time with min v.c    Time 6    Period Weeks    Status Achieved      OT SHORT TERM GOAL #5   Title Pt will transfer to toilet with close supervison    Time 6    Period Weeks    Status Achieved  OT Long Term Goals - 04/03/20 1210      OT LONG TERM GOAL #1   Title I with aquatic HEP    Time 12    Period Weeks    Status Revised      OT LONG TERM GOAL #2   Title Pt will donn shirt mod I and perfrom LB dressing with min A    Time 12    Period Weeks    Status Achieved      OT LONG TERM GOAL #3   Title Pt. will bathe with min A    Time 12    Period Weeks    Status Achieved   distant supervision     OT LONG TERM GOAL #4   Title Pt will demonstrate 40* A/ROM shoulder flexion in prep for reach    Time 12    Period Weeks    Status On-going   goes into abduction     OT LONG TERM GOAL #5   Title Pt will perform tabletop scanning activities with 75%better accuracy    Time 12    Period Weeks    Status Achieved      OT LONG TERM GOAL #6   Title Pt will demonstrate 50% elbow flexion/ extension in LUE  for functional use.    Time 12    Period Weeks    Status Partially Met   met in flexion, 25% in extension                Plan - 04/03/20 1211    Clinical Impression Statement Pt to hopefully get  botox later this week. Pt has met all STG's and some LTG's. Adjusted LTG #1 for aquatics as pt will start next week.    OT Occupational Profile and History Detailed Assessment- Review of Records and additional review of physical, cognitive, psychosocial history related to current functional performance    Occupational performance deficits (Please refer to evaluation for details): ADL's;IADL's;Leisure;Social Participation;Work;Play    Body Structure / Function / Physical Skills ADL;Balance;Endurance;Mobility;Strength;UE functional use;FMC;Vision;Coordination;Gait;Decreased knowledge of precautions;GMC;ROM;Decreased knowledge of use of DME;Dexterity;IADL;Sensation    Cognitive Skills Attention;Memory;Problem Solve;Safety Awareness;Thought;Understand    Rehab Potential Fair    Clinical Decision Making Limited treatment options, no task modification necessary    Comorbidities Affecting Occupational Performance: May have comorbidities impacting occupational performance    Modification or Assistance to Complete Evaluation  No modification of tasks or assist necessary to complete eval    OT Frequency 2x / week   plus eval   OT Duration 12 weeks    OT Treatment/Interventions Self-care/ADL training;Therapeutic exercise;Functional Mobility Training;Balance training;Splinting;Manual Therapy;Neuromuscular education;Aquatic Therapy;Ultrasound;Energy conservation;Therapeutic activities;Cryotherapy;Paraffin;DME and/or AE instruction;Cognitive remediation/compensation;Visual/perceptual remediation/compensation;Gait Training;Fluidtherapy;Electrical Stimulation;Moist Heat;Contrast Bath;Passive range of motion;Patient/family education    Plan Pt to begin aquatic therapy for O.T. 04/10/20 for 4 sessions. Update/add goals prn. (Pt reports he can then continue to work on HEP at pool in December then return to [facility] in 2022 if he changes insurance)    Consulted and Agree with Plan of Care Patient;Family member/caregiver            Patient will benefit from skilled therapeutic intervention in order to improve the following deficits and impairments:   Body Structure / Function / Physical Skills: ADL, Balance, Endurance, Mobility, Strength, UE functional use, FMC, Vision, Coordination, Gait, Decreased knowledge of precautions, GMC, ROM, Decreased knowledge of use of DME, Dexterity, IADL, Sensation Cognitive Skills: Attention, Memory, Problem Solve, Safety Awareness, Thought, Understand     Visit  Diagnosis: Other symptoms and signs involving the nervous system  Unsteadiness on feet  Muscle weakness (generalized)  Hemiplegia and hemiparesis following cerebral infarction affecting left non-dominant side [facility])    Problem List Patient Active Problem List   Diagnosis Date Noted  . History of CVA (cerebrovascular accident) 01/19/2020  . Normocytic anemia   . Benign essential HTN   . Hyponatremia   . Acute blood loss anemia   . Hypotension due to drugs   . Vascular headache   . Prerenal azotemia   . Sleep disturbance   . Leukocytosis   . Transaminitis   . Marijuana abuse   . Essential hypertension   . Dysphagia, post-stroke   . Left hemiparesis ([place])   . Acute respiratory failure ([place])   . ICH (intracerebral hemorrhage) ([place]) 12/06/2019    Carey Bullocks, OTR/L 04/03/2020, 12:15 PM  [facility] [address] [place], [place], [address] Phone: [phone]   Fax:  [phone]  Name: Dennis Mercado MRN: [id number] Date of Birth: 1981-05-19

## 2020-04-05 ENCOUNTER — Ambulatory Visit: Payer: BC Managed Care – PPO

## 2020-04-05 ENCOUNTER — Ambulatory Visit: Payer: BC Managed Care – PPO | Admitting: Physical Therapy

## 2020-04-05 ENCOUNTER — Other Ambulatory Visit: Payer: Self-pay

## 2020-04-05 DIAGNOSIS — R414 Neurologic neglect syndrome: Secondary | ICD-10-CM | POA: Diagnosis not present

## 2020-04-05 DIAGNOSIS — R2681 Unsteadiness on feet: Secondary | ICD-10-CM

## 2020-04-05 DIAGNOSIS — M6281 Muscle weakness (generalized): Secondary | ICD-10-CM

## 2020-04-05 DIAGNOSIS — I69354 Hemiplegia and hemiparesis following cerebral infarction affecting left non-dominant side: Secondary | ICD-10-CM | POA: Diagnosis not present

## 2020-04-05 DIAGNOSIS — R41842 Visuospatial deficit: Secondary | ICD-10-CM | POA: Diagnosis not present

## 2020-04-05 DIAGNOSIS — R29818 Other symptoms and signs involving the nervous system: Secondary | ICD-10-CM

## 2020-04-05 DIAGNOSIS — R2689 Other abnormalities of gait and mobility: Secondary | ICD-10-CM | POA: Diagnosis not present

## 2020-04-05 DIAGNOSIS — R41841 Cognitive communication deficit: Secondary | ICD-10-CM | POA: Diagnosis not present

## 2020-04-05 DIAGNOSIS — R208 Other disturbances of skin sensation: Secondary | ICD-10-CM | POA: Diagnosis not present

## 2020-04-05 NOTE — Therapy (Signed)
[facility] [address] [place], [place], [address] Phone: [phone]   Fax:  [phone]  Physical Therapy Treatment  Patient Details  Name: Dennis Mercado MRN: [id number] Date of Birth: 06/29/1980 Referring Provider (PT): Angiulli, Chapman Fitch (will be followed by Dr. Letta Pate)   Encounter Date: 04/05/2020   PT End of Session - 04/05/20 1546    Visit Number 13    Number of Visits 15    Date for PT Re-Evaluation 04/28/20    Authorization Type BCBS, VL for PT and OT = 30 (zero used, if seen on same day, will count as 2 visits), VL for ST = 30    Authorization - Visit Number 13    Authorization - Number of Visits 15    PT Start Time 0800    PT Stop Time 0845    PT Time Calculation (min) 45 min    Equipment Utilized During Treatment Gait belt    Activity Tolerance Patient tolerated treatment well    Behavior During Therapy [facility] for tasks assessed/performed           No past medical history on file.  No past surgical history on file.  There were no vitals filed for this visit.   Subjective Assessment - 04/05/20 0807    Subjective No falls. Got the cane - began to use it at home with wife. Going to see Dr. Letta Pate tomorrow, hopefully will be getting botox.    Pertinent History HTN    Patient Stated Goals wants to start walking as soon as possible - "less walker and downgrade to a cane and then nothing at all"    Currently in Pain? No/denies                             [facility] Adult PT Treatment/Exercise - 04/05/20 1547      Transfers   Comments at edge of mat table with pt facing chair, had bowl on seat for incr weight bearing through LUE (therapist helping pt maintain it into position), from slightly elevated mat table and having pt's LLE staggered behind RLE, performed mini squat holds for incr weight bearing through L side x10 reps      Ambulation/Gait   Ambulation/Gait Yes     Ambulation/Gait Assistance 4: Min guard    Ambulation/Gait Assistance Details pt brought in small based quad cane from home - ambulated with pt in from waiting room, occassional cues for wider BOS and intermittent support from therapist for incr weight shifting over to LLE for incr step length with RLE. pt ambulated out of clinic with RW with supervision    Ambulation Distance (Feet) 115 Feet   x2   Assistive device Small based quad cane    Gait Pattern Decreased stance time - left;Decreased hip/knee flexion - left;Decreased weight shift to left;Left flexed knee in stance;Narrow base of support;Step-through pattern;Decreased arm swing - left    Ambulation Surface Level;Indoor    Gait Comments gait training with small base quad cane over red compliant mat (to simulate pt's carpet surfaces at home) and weaving in and out of cones 5 cones, needing closer min guard initially when practicing turning around cones, performed a total of 30' 4 reps, cues for wider BOS at time and avoiding cones with cane       Neuro Re-ed    Neuro Re-ed Details  Got into tall kneeling position: needing single UE support on  chair and min guard when turning to face the table, needing min A and verbal cues to shift to LLE and bring RLE onto mat with RUE support, initial facilitation and min A for tall kneeling crawling to get closer to [facility] bench. Put bowl under LUE for incr weight bearing. Once in tall kneeling performed x10 reps mini squats with bias towards LLE, cues for glute activation when kneeling. With PT tech in front of pt holding cones in direction directions, had pt reach with RUE to grab cone for incr weight bearing/shifting towards LLE, occasionally with RLE raising off of table x15 reps total with min A at times for balance. PT helping with weight bearing through LUE on bowl. In tall kneeling still: performed eyes closed x30 seconds, and eyes closed 2 x 5 reps head turns with min A at times for balance with no UE support                      PT Short Term Goals - 02/25/20 1427      PT SHORT TERM GOAL #1   Title Pt and pt's spouse will be independent with initial HEP in order to build upon functional gains made in therapy. ALL STGS DUE 02/15/20    Time 4    Period Weeks    Status Achieved    Target Date 02/15/20      PT SHORT TERM GOAL #2   Title Pt will perform squat pivot vs. stand pivot with supervision in order to improve functional mobility and decr caregiver burden.    Baseline squat pivot with min guard, stand pivot with min A    Time 4    Period Weeks    Status Not Met      PT SHORT TERM GOAL #3   Title Pt will perform sit <> stands with RW with supervision and pt able to place LUE in L hand orthosis with supervision in order to improve functional mobility.    Baseline supervision/min guard    Time 4    Period Weeks    Status Partially Met      PT SHORT TERM GOAL #4   Title Pt will ambulate at least 100' with RW and min guard in order to improve household mobility.    Time 4    Period Weeks    Status Achieved      PT SHORT TERM GOAL #5   Title Pt will perform standing balance with single UE support at sink for 5 minutes with supervision in order to incr tolerance for ADLs.    Baseline 5 minutes for dynamic tasks with single/no UE support at sink    Time 4    Period Weeks    Status Achieved             PT Long Term Goals - 03/17/20 1249      PT LONG TERM GOAL #1   Title Pt and pt's spouse will be independent with final HEP in order to build upon functional gains made in therapy. ALL LTGS DUE 04/21/20    Baseline pt reports consistently performing current HEP - will continue to benefit from further additions    Time 5    Period Weeks    Status On-going    Target Date 04/21/20      PT LONG TERM GOAL #2   Title Pt will ambulate at least 230' with RW vs. LRAD and supervision over unlevel surfaces in order to  improve functional mobility.    Baseline ambulates wth RW  with supervision/min guard over level surfaces    Time 5    Period Weeks    Status On-going      PT LONG TERM GOAL #3   Title Gait speed to be assessed with LTG to be written as appropriate in order to demo improved gait efficiency.    Time 5    Period Weeks    Status New      PT LONG TERM GOAL #4   Title Pt will perform 2 steps with single HHA vs. LRAD with min guard in order to safely enter home.    Baseline not yet assessed.    Time 5    Period Weeks    Status On-going      PT LONG TERM GOAL #5   Title Pt will perform dynamic standing activites for 5 minutes with no UE support with min guard in order to demo improved balance for ADLs.    Time 5    Period Weeks    Status New                 Plan - 04/05/20 1553    Clinical Impression Statement Continued to focus on gait training with small base quad cane - incorporated obstacle negotiation today and over a compliant surface (red mat), with pt needing closer min guard esp when beginning to turn around cones. Pt with consistent step through pattern with gait with small base cane today. Pt tolerated tall kneeling well with weight shifting activities towards LLE, challenged in this position with eyes closed with head motions. Will continue to progress towards LTGs.    Personal Factors and Comorbidities Comorbidity 1;Past/Current Experience;Age;Profession    Comorbidities HTN    Examination-Activity Limitations Bathing;Bed Mobility;Carry;Hygiene/Grooming;Stand;Stairs;Squat;Transfers;Locomotion Level    Examination-Participation Restrictions Community Activity;Occupation   walking his dog   Stability/Clinical Decision Making Evolving/Moderate complexity    Rehab Potential Good    PT Frequency 1x / week    PT Duration --   5 WEEKS   PT Treatment/Interventions ADLs/Self Care Home Management;Aquatic Therapy;Electrical Stimulation;DME Instruction;Gait training;Stair training;Functional mobility training;Therapeutic  activities;Therapeutic exercise;Balance training;Neuromuscular re-education;Wheelchair mobility training;Orthotic Fit/Training;Patient/family education;Passive range of motion;Energy conservation;Vestibular    PT Next Visit Plan gait training with small base quad cane - try unlevel surfaces/incline/obstacles/can try outdoors if weather permits, stair training?, tall kneeling, step ups with LLE, NMR for weight shifting through LLE and LUE    PT Home Exercise Plan [email or web address]    Consulted and Agree with Plan of Care Patient;Family member/caregiver    Family Member Consulted wife, April           Patient will benefit from skilled therapeutic intervention in order to improve the following deficits and impairments:  Abnormal gait, Decreased activity tolerance, Decreased balance, Decreased mobility, Decreased knowledge of use of DME, Decreased coordination, Decreased range of motion, Decreased strength, Difficulty walking, Impaired tone, Impaired sensation, Postural dysfunction  Visit Diagnosis: Other symptoms and signs involving the nervous system  Unsteadiness on feet  Muscle weakness (generalized)  Hemiplegia and hemiparesis following cerebral infarction affecting left non-dominant side [facility])     Problem List Patient Active Problem List   Diagnosis Date Noted  . History of CVA (cerebrovascular accident) 01/19/2020  . Normocytic anemia   . Benign essential HTN   . Hyponatremia   . Acute blood loss anemia   . Hypotension due to drugs   . Vascular headache   . Prerenal azotemia   .  Sleep disturbance   . Leukocytosis   . Transaminitis   . Marijuana abuse   . Essential hypertension   . Dysphagia, post-stroke   . Left hemiparesis ([place])   . Acute respiratory failure ([place])   . ICH (intracerebral hemorrhage) ([place]) 12/06/2019    Arliss Journey, PT, DPT  04/05/2020, 3:55 PM  [facility] [address] [place], [place],  [address] Phone: [phone]   Fax:  [phone]  Name: Dennis Mercado MRN: [id number] Date of Birth: 03-08-1981

## 2020-04-06 ENCOUNTER — Encounter: Payer: Self-pay | Admitting: Physical Medicine & Rehabilitation

## 2020-04-06 ENCOUNTER — Encounter
Payer: BC Managed Care – PPO | Attending: Physical Medicine & Rehabilitation | Admitting: Physical Medicine & Rehabilitation

## 2020-04-06 VITALS — BP 125/88 | HR 100 | Temp 98.1°F | Ht 67.0 in | Wt 145.0 lb

## 2020-04-06 DIAGNOSIS — G8114 Spastic hemiplegia affecting left nondominant side: Secondary | ICD-10-CM | POA: Diagnosis not present

## 2020-04-06 NOTE — Patient Instructions (Signed)
You received a Dysport injection today. You may experience muscle pains and aches. He may apply ice 20 minutes every 2 hours as needed for the next 24-48 hours. He also noticed bleeding or bruising in the areas that were injected. May apply Band-Aid. If this bruising is extensive, please notify our office. If there is evidence of increasing redness that occurs 2-3 days after injection. Please call our office. This could be a sign of infection. It is very rare, however. You may experience some muscle weakness in the muscles and injected. This would likely start in about one week. Left  Biceps 300  FPL 100 FCR 200 FDS 200 FDP 200

## 2020-04-06 NOTE — Progress Notes (Signed)
Dysport Injection for spasticity using needle EMG guidance  Dilution: 200 Units/ml Indication: Severe spasticity which interferes with ADL,mobility and/or  hygiene and is unresponsive to medication management and other conservative care Informed consent was obtained after describing risks and benefits of the procedure with the patient. This includes bleeding, bruising, infection, excessive weakness, or medication side effects. A REMS form is on file and signed. Needle:  needle electrode Number of units per muscle  Left  Biceps 300  FPL 100 FCR 200 FDS 200 FDP 200 All injections were done after obtaining appropriate EMG activity and after negative drawback for blood. The patient tolerated the procedure well. Post procedure instructions were given. A followup appointment was made.

## 2020-04-10 ENCOUNTER — Ambulatory Visit: Payer: BC Managed Care – PPO | Admitting: Occupational Therapy

## 2020-04-10 ENCOUNTER — Encounter: Payer: BC Managed Care – PPO | Admitting: Occupational Therapy

## 2020-04-10 DIAGNOSIS — R2681 Unsteadiness on feet: Secondary | ICD-10-CM

## 2020-04-10 DIAGNOSIS — R208 Other disturbances of skin sensation: Secondary | ICD-10-CM

## 2020-04-10 DIAGNOSIS — I69354 Hemiplegia and hemiparesis following cerebral infarction affecting left non-dominant side: Secondary | ICD-10-CM

## 2020-04-10 DIAGNOSIS — R29818 Other symptoms and signs involving the nervous system: Secondary | ICD-10-CM

## 2020-04-10 DIAGNOSIS — M6281 Muscle weakness (generalized): Secondary | ICD-10-CM

## 2020-04-10 DIAGNOSIS — R414 Neurologic neglect syndrome: Secondary | ICD-10-CM | POA: Diagnosis not present

## 2020-04-10 DIAGNOSIS — R41841 Cognitive communication deficit: Secondary | ICD-10-CM | POA: Diagnosis not present

## 2020-04-10 DIAGNOSIS — R41842 Visuospatial deficit: Secondary | ICD-10-CM | POA: Diagnosis not present

## 2020-04-10 DIAGNOSIS — R2689 Other abnormalities of gait and mobility: Secondary | ICD-10-CM | POA: Diagnosis not present

## 2020-04-11 ENCOUNTER — Encounter: Payer: Self-pay | Admitting: Occupational Therapy

## 2020-04-11 NOTE — Therapy (Signed)
[facility] [address] [place], [place], [address] Phone: [phone]   Fax:  [phone]  Occupational Therapy Treatment  Patient Details  Name: Dennis Mercado MRN: [id number] Date of Birth: 04-06-1981 Referring Provider (OT): Dr. Letta Pate   Encounter Date: 04/10/2020   OT End of Session - 04/11/20 1130    Visit Number 12    Number of Visits 25    Date for OT Re-Evaluation 05/01/20    Authorization Type BCBS    Authorization Time Period 90 days- 30 VL combined for OT/ PT, will adjust frequency accordingly    Authorization - Visit Number 12    Authorization - Number of Visits 15    OT Start Time 1545    OT Stop Time 1635    OT Time Calculation (min) 50 min    Activity Tolerance Patient tolerated treatment well    Behavior During Therapy [facility] for tasks assessed/performed           History reviewed. No pertinent past medical history.  History reviewed. No pertinent surgical history.  There were no vitals filed for this visit.   Subjective Assessment - 04/11/20 1129    Subjective  I have just started walking with cane [facility])    Patient is accompanied by: Family member    Pertinent History Pt. presented to ED on 12/06/2019 with left side weakness and  altered mental status s/p coitus.  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 Pt required intubation 6/22 due to ARF secondary to aspiration PNA. Received inpatient rehab. Pt was discharged 7/28/2    Limitations HTN, left inattention, apraxia    Currently in Pain? No/denies    Pain Score 0-No pain           Patient seen for aquatic therapy today - first session.  Patient ambulated with min assist on wet pool deck - using water shoes and SBQC.  Patient entered and exited pool via ramp, and treatment provided in 3.5 ft water.  Patient with increased tesnion in left limbs -  left hip, knee, andkle, left trunk, left shoulder, elbow , wrist.  Provided supine support with floatation devices to allow body on arm motion to allow improved passive range of motion and decrease excessive activation.  Patient able to use railing for slight traction to left shoulder to increase shoulder flex/ horizontal adduction.  Worked on balance and equal weight bearing with walking, with decreased reliance on UE's.                         OT Short Term Goals - 04/11/20 1132      OT SHORT TERM GOAL #1   Title I with inital HEP    Time 6    Period Weeks    Status Achieved      OT SHORT TERM GOAL #2   Title Pt will donn shirt with supervision and min v.c, and pants with min A    Time 6    Period Weeks    Status Achieved      OT SHORT TERM GOAL #3   Title Pt will attend to items in L hemibody space with no more than min v.c    Time 6    Period Weeks    Status Achieved      OT SHORT TERM GOAL #4   Title Pt will use LUE as stabilizer  10%of the time with min v.c    Time 6    Period Weeks    Status Achieved      OT SHORT TERM GOAL #5   Title Pt will transfer to toilet with close supervison    Time 6    Period Weeks    Status Achieved             OT Long Term Goals - 04/11/20 1133      OT LONG TERM GOAL #1   Title I with aquatic HEP    Time 12    Period Weeks    Status Revised      OT LONG TERM GOAL #2   Title Pt will donn shirt mod I and perfrom LB dressing with min A    Time 12    Period Weeks    Status Achieved      OT LONG TERM GOAL #3   Title Pt. will bathe with min A    Time 12    Period Weeks    Status Achieved   distant supervision     OT LONG TERM GOAL #4   Title Pt will demonstrate 40* A/ROM shoulder flexion in prep for reach    Time 12    Period Weeks    Status On-going   goes into abduction     OT LONG TERM GOAL #5   Title Pt will perform tabletop scanning activities with 75%better accuracy    Time 12    Period Weeks      Status Achieved      OT LONG TERM GOAL #6   Title Pt will demonstrate 50% elbow flexion/ extension in LUE  for functional use.    Time 12    Period Weeks    Status Partially Met   met in flexion, 25% in extension                Plan - 04/11/20 1130    Clinical Impression Statement Patient received Botox injection 5 days ago - little noticeable impact at this time.  Patient started aquatic therapy session today.    OT Occupational Profile and History Detailed Assessment- Review of Records and additional review of physical, cognitive, psychosocial history related to current functional performance    Occupational performance deficits (Please refer to evaluation for details): ADL's;IADL's;Leisure;Social Participation;Work;Play    Body Structure / Function / Physical Skills ADL;Balance;Endurance;Mobility;Strength;UE functional use;FMC;Vision;Coordination;Gait;Decreased knowledge of precautions;GMC;ROM;Decreased knowledge of use of DME;Dexterity;IADL;Sensation    Cognitive Skills Attention;Memory;Problem Solve;Safety Awareness;Thought;Understand    Rehab Potential Fair    Clinical Decision Making Limited treatment options, no task modification necessary    Comorbidities Affecting Occupational Performance: May have comorbidities impacting occupational performance    Modification or Assistance to Complete Evaluation  No modification of tasks or assist necessary to complete eval    OT Frequency 2x / week   plus eval   OT Duration 12 weeks    OT Treatment/Interventions Self-care/ADL training;Therapeutic exercise;Functional Mobility Training;Balance training;Splinting;Manual Therapy;Neuromuscular education;Aquatic Therapy;Ultrasound;Energy conservation;Therapeutic activities;Cryotherapy;Paraffin;DME and/or AE instruction;Cognitive remediation/compensation;Visual/perceptual remediation/compensation;Gait Training;Fluidtherapy;Electrical Stimulation;Moist Heat;Contrast Bath;Passive range of  motion;Patient/family education    Plan Aquatic therapy to address LUE functioning, decrease tension, and improve functional mobility.  Need scap mobility, and reduced tension throughout LUE/trunk    Consulted and Agree with Plan of Care Patient;Family member/caregiver    Family Member Consulted wife April           Patient will benefit from skilled therapeutic intervention in order to improve the  following deficits and impairments:   Body Structure / Function / Physical Skills: ADL, Balance, Endurance, Mobility, Strength, UE functional use, FMC, Vision, Coordination, Gait, Decreased knowledge of precautions, GMC, ROM, Decreased knowledge of use of DME, Dexterity, IADL, Sensation Cognitive Skills: Attention, Memory, Problem Solve, Safety Awareness, Thought, Understand     Visit Diagnosis: Other symptoms and signs involving the nervous system  Unsteadiness on feet  Muscle weakness (generalized)  Hemiplegia and hemiparesis following cerebral infarction affecting left non-dominant side (HCC)  Other disturbances of skin sensation  Visuospatial deficit  Neurologic neglect syndrome    Problem List Patient Active Problem List   Diagnosis Date Noted  . History of CVA (cerebrovascular accident) 01/19/2020  . Normocytic anemia   . Benign essential HTN   . Hyponatremia   . Acute blood loss anemia   . Hypotension due to drugs   . Vascular headache   . Prerenal azotemia   . Sleep disturbance   . Leukocytosis   . Transaminitis   . Marijuana abuse   . Essential hypertension   . Dysphagia, post-stroke   . Left hemiparesis ([place])   . Acute respiratory failure ([place])   . ICH (intracerebral hemorrhage) ([place]) 12/06/2019    Mariah Milling, OTR/L 04/11/2020, 11:33 AM  [facility] [address] [place], [place], [address] Phone: [phone]   Fax:  [phone]  Name: Dezmin Kittelson MRN: [id number] Date of Birth:  1980-12-18

## 2020-04-13 ENCOUNTER — Ambulatory Visit: Payer: BC Managed Care – PPO

## 2020-04-13 ENCOUNTER — Ambulatory Visit: Payer: BC Managed Care – PPO | Admitting: Physical Therapy

## 2020-04-13 ENCOUNTER — Other Ambulatory Visit: Payer: Self-pay

## 2020-04-13 ENCOUNTER — Encounter: Payer: Self-pay | Admitting: Physical Therapy

## 2020-04-13 DIAGNOSIS — R29818 Other symptoms and signs involving the nervous system: Secondary | ICD-10-CM | POA: Diagnosis not present

## 2020-04-13 DIAGNOSIS — R2689 Other abnormalities of gait and mobility: Secondary | ICD-10-CM | POA: Diagnosis not present

## 2020-04-13 DIAGNOSIS — R41841 Cognitive communication deficit: Secondary | ICD-10-CM | POA: Diagnosis not present

## 2020-04-13 DIAGNOSIS — R41842 Visuospatial deficit: Secondary | ICD-10-CM | POA: Diagnosis not present

## 2020-04-13 DIAGNOSIS — M6281 Muscle weakness (generalized): Secondary | ICD-10-CM | POA: Diagnosis not present

## 2020-04-13 DIAGNOSIS — I69354 Hemiplegia and hemiparesis following cerebral infarction affecting left non-dominant side: Secondary | ICD-10-CM

## 2020-04-13 DIAGNOSIS — R208 Other disturbances of skin sensation: Secondary | ICD-10-CM | POA: Diagnosis not present

## 2020-04-13 DIAGNOSIS — R414 Neurologic neglect syndrome: Secondary | ICD-10-CM | POA: Diagnosis not present

## 2020-04-13 DIAGNOSIS — R2681 Unsteadiness on feet: Secondary | ICD-10-CM

## 2020-04-13 NOTE — Patient Instructions (Signed)
° ° ° °  [facility]  [phone] [email or web address]

## 2020-04-14 NOTE — Therapy (Signed)
[facility] [address] [place], [place], [address] Phone: [phone]   Fax:  [phone]  Speech Language Pathology Treatment/Discharge Summary  Patient Details  Name: Dennis Mercado MRN: [id number] Date of Birth: 1980/12/09 Referring Provider (SLP): [place], [place]   Encounter Date: 04/13/2020  SPEECH THERAPY DISCHARGE SUMMARY  Visits from Start of Care: 14  Current functional level related to goals / functional outcomes: Pt made gains with attention and awareness, and with executive function. Standardized testing revealed pt's cognitive linguistic skills were WNL in all domains assessed.   Remaining deficits: None.   Education / Equipment: Compensations for cognitive linguistic deficits.  Plan: Patient agrees to discharge.  Patient goals were partially met. Patient is being discharged due to being pleased with the current functional level.  ?????          End of Session - 04/14/20 0050    Visit Number 14    Number of Visits 25    Date for SLP Re-Evaluation 04/20/20    Authorization Type 30 visit limit ST, 30 visits combined for PT/OT    Authorization - Visit Number 26    Authorization - Number of Visits 96    SLP Start Time 1694    SLP Stop Time  0845    SLP Time Calculation (min) 41 min    Activity Tolerance Patient tolerated treatment well           History reviewed. No pertinent past medical history.  History reviewed. No pertinent surgical history.  There were no vitals filed for this visit.   Subjective Assessment - 04/13/20 0810    Subjective Pt reports he is still using compensations for attention to detail and memory if necessary.    Patient is accompained by: --   Dennis Mercado   Currently in Pain? No/denies                 ADULT SLP TREATMENT - 04/14/20 0001      General Information   Behavior/Cognition Alert;Cooperative;Pleasant mood      Treatment Provided    Treatment provided Cognitive-Linquistic      Cognitive-Linquistic Treatment   Treatment focused on Cognition    Skilled Treatment Neither pt nor Mercado had any questions nor comments for SLP today, so SLP initiated cognitive lingistic quick test (CLQT) to assess cognitive skills at the end of therapy course.  Pt scored WNL for all domains.      Assessment / Recommendations / Plan   Plan Discharge SLP treatment due to (comment)   Pt reports skills are at baseline     Progression Toward Goals   Progression toward goals --   d/c day- see summary           SLP Education - 04/14/20 0050    Education Details eval results    Person(s) Educated Patient;Spouse    Methods Explanation    Comprehension Verbalized understanding            SLP Short Term Goals - 03/01/20 1143      SLP SHORT TERM GOAL #1   Title pt will initiate use of memory compensations in 3 sessions    Baseline 02-10-20, 02-14-20, 02-15-20    Period --   or 9 sessions   Status Achieved      SLP SHORT TERM GOAL #2   Title pt will use memory compensations successfully between/in 3 sessions    Baseline 02-10-20, 02-14-20, 02-16-20    Time --  or 13 sessions   Status Achieved      SLP SHORT TERM GOAL #3   Title pt will tell SLP 3 cognitive linguistic deficits in 2 sessions    Time --   or9 sessions   Status Deferred      SLP SHORT TERM GOAL #4   Title pt will demonstrate error awareness in therapy tasks 100% with spontaneous re-check, in 3 sessions    Baseline 02-10-20, 02-14-20, 02-16-20 (breakfast menu)    Time --   or 13 sessions   Status Achieved      SLP SHORT TERM GOAL #5   Title pt will demo selective attention in mod noisy environment for 10 minutes while completing simple-mod complex therapy task/s in 3 sessions    Baseline 02-16-20    Period --   or 13 sessions   Status Partially Met            SLP Long Term Goals - 04/14/20 0058      SLP LONG TERM GOAL #1   Title pt will demonstrate WFL attention and  problem solving skills 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) to prepare for return to work 100% with self-corrections in 3 sessions    Baseline 02-10-20, 02-14-20,    Period --   or 25 sessions, for all LTGs   Status Achieved      SLP LONG TERM GOAL #2   Title pt will utilize functional  memory compensations successfully between therapy tasks reported by pt and/or Mercado between 4 sessions    Baseline 02-16-20, 02-23-20, 03-01-20    Status Achieved      SLP LONG TERM GOAL #3   Title pt will demonstrate divided attention in two simple therapy tasks in 3 sessions    Baseline 03-22-20    Status Achieved      SLP LONG TERM GOAL #4   Title pt will use compensations for attention successfully PRN in 2 sessions    Status Achieved      SLP LONG TERM GOAL #5   Title pt will demonstrate functional executive function skills in mod- complex/complex planning tasks, accounting for detail management, in three sessions    Baseline 03-15-20, 03-22-20    Status Achieved            Plan - 04/14/20 0051    Clinical Impression Statement Pt presents today with WFL/WNL cognitive linguistic skills as demonstrated by cognitive linguistic quick test (CLQT). Skilled ST will be d/c'd at this time and pt agrees with d/c.    Treatment/Interventions Internal/external aids;Patient/family education;Compensatory strategies;Cognitive reorganization;Cueing hierarchy;SLP instruction and feedback;Functional tasks;Environmental controls    Potential to Achieve Goals Good    Potential Considerations Cooperation/participation level   awareness          Patient will benefit from skilled therapeutic intervention in order to improve the following deficits and impairments:   Cognitive communication deficit    Problem List Patient Active Problem List   Diagnosis Date Noted  . History of CVA (cerebrovascular accident) 01/19/2020  . Normocytic anemia   . Benign essential HTN   . Hyponatremia   . Acute  blood loss anemia   . Hypotension due to drugs   . Vascular headache   . Prerenal azotemia   . Sleep disturbance   . Leukocytosis   . Transaminitis   . Marijuana abuse   . Essential hypertension   . Dysphagia, post-stroke   . Left hemiparesis ([place])   . Acute respiratory failure ([place])   .  ICH (intracerebral hemorrhage) ([place]) 12/06/2019    [facility] ,[place], [place]  04/14/2020, 1:00 AM  [facility] [address] [place], [place], [address] Phone: [phone]   Fax:  [phone]   Name: Dennis Mercado MRN: [id number] Date of Birth: [birth year]

## 2020-04-14 NOTE — Therapy (Signed)
[facility] [address] [place], [place], [address] Phone: [phone]   Fax:  [phone]  Physical Therapy Treatment  Patient Details  Name: Dennis Mercado MRN: [id number] Date of Birth: [birth year] Referring Provider (PT): [place], Chapman Fitch (will be followed by Dr. Letta Pate)   Encounter Date: 04/13/2020   PT End of Session - 04/13/20 0849    Visit Number 14    Number of Visits 15    Date for PT Re-Evaluation 04/28/20    Authorization Type BCBS, VL for PT and OT = 30 (zero used, if seen on same day, will count as 2 visits), VL for ST = 30    Authorization - Visit Number 14    Authorization - Number of Visits 15    PT Start Time 5465    PT Stop Time 0927    PT Time Calculation (min) 40 min    Equipment Utilized During Treatment Gait belt    Activity Tolerance Patient tolerated treatment well    Behavior During Therapy [facility] for tasks assessed/performed           History reviewed. No pertinent past medical history.  History reviewed. No pertinent surgical history.  There were no vitals filed for this visit.   Subjective Assessment - 04/13/20 0848    Subjective No new complaints. Using the cane to therapy today, reports he is using it most of the time. Uses the walker at times too get weight on left UE and stretch the arm out.    Pertinent History HTN    Patient Stated Goals wants to start walking as soon as possible - "less walker and downgrade to a cane and then nothing at all"    Currently in Pain? No/denies                 [facility] Adult PT Treatment/Exercise - 04/13/20 0851      Transfers   Transfers Sit to Stand;Stand to Sit    Sit to Stand 5: Supervision;Without upper extremity assist;From bed;From chair/3-in-1    Stand to Sit 5: Supervision;With upper extremity assist;To chair/3-in-1;To bed      Ambulation/Gait   Ambulation/Gait Yes    Ambulation/Gait Assistance 4: Min guard     Ambulation/Gait Assistance Details cues on posture at times, no balance loss noted on indoor/outdoor surfaces    Ambulation Distance (Feet) 260 Feet   x1, plus around gym with session   Assistive device Small based quad cane    Gait Pattern Decreased stance time - left;Decreased hip/knee flexion - left;Decreased weight shift to left;Left flexed knee in stance;Narrow base of support;Step-through pattern;Decreased arm swing - left    Ambulation Surface Level;Unlevel;Indoor;Outdoor;Paved;Gravel;Other (comment)   rubber mulch   Curb Other (comment)   min guard assist   Curb Details (indicate cue type and reason) outdoor curb with cane, min guard assist. Pt able to recall and demo correct sequencing with no cues needed    Pre-Gait Activities --               Balance Exercises - 04/13/20 0904      Balance Exercises: Standing   Standing Eyes Closed Wide ([place]);Head turns;Foam/compliant surface;Other reps (comment);30 secs;Limitations    Standing Eyes Closed Limitations standing on airex with feet hip width apart, no UE support: EC 30 sec's x 3 reps, progressing to EC head movements left<>right, up<>down x 10 reps. min guard to min assist with increased lateral lean with over correction by pt needing assist at  times for balance.     Rockerboard Anterior/posterior;Lateral;Head turns;EC;30 seconds;Limitations    Rockerboard Limitations performed both ways on balance board: holding the board steady for EC 30 sec's x 3 reps, progressing to EC hed movements left<>right, up<>down for 8-9 reps with up to min assist. increased assist needed with lateral direction.               PT Short Term Goals - 02/25/20 1427      PT SHORT TERM GOAL #1   Title Pt and pt's spouse will be independent with initial HEP in order to build upon functional gains made in therapy. ALL STGS DUE 02/15/20    Time 4    Period Weeks    Status Achieved    Target Date 02/15/20      PT SHORT TERM GOAL #2   Title Pt will  perform squat pivot vs. stand pivot with supervision in order to improve functional mobility and decr caregiver burden.    Baseline squat pivot with min guard, stand pivot with min A    Time 4    Period Weeks    Status Not Met      PT SHORT TERM GOAL #3   Title Pt will perform sit <> stands with RW with supervision and pt able to place LUE in L hand orthosis with supervision in order to improve functional mobility.    Baseline supervision/min guard    Time 4    Period Weeks    Status Partially Met      PT SHORT TERM GOAL #4   Title Pt will ambulate at least 100' with RW and min guard in order to improve household mobility.    Time 4    Period Weeks    Status Achieved      PT SHORT TERM GOAL #5   Title Pt will perform standing balance with single UE support at sink for 5 minutes with supervision in order to incr tolerance for ADLs.    Baseline 5 minutes for dynamic tasks with single/no UE support at sink    Time 4    Period Weeks    Status Achieved             PT Long Term Goals - 03/17/20 1249      PT LONG TERM GOAL #1   Title Pt and pt's spouse will be independent with final HEP in order to build upon functional gains made in therapy. ALL LTGS DUE 04/21/20    Baseline pt reports consistently performing current HEP - will continue to benefit from further additions    Time 5    Period Weeks    Status On-going    Target Date 04/21/20      PT LONG TERM GOAL #2   Title Pt will ambulate at least 230' with RW vs. LRAD and supervision over unlevel surfaces in order to improve functional mobility.    Baseline ambulates wth RW with supervision/min guard over level surfaces    Time 5    Period Weeks    Status On-going      PT LONG TERM GOAL #3   Title Gait speed to be assessed with LTG to be written as appropriate in order to demo improved gait efficiency.    Time 5    Period Weeks    Status New      PT LONG TERM GOAL #4   Title Pt will perform 2 steps with single HHA vs.  LRAD with  min guard in order to safely enter home.    Baseline not yet assessed.    Time 5    Period Weeks    Status On-going      PT LONG TERM GOAL #5   Title Pt will perform dynamic standing activites for 5 minutes with no UE support with min guard in order to demo improved balance for ADLs.    Time 5    Period Weeks    Status New                 Plan - 04/13/20 0850    Clinical Impression Statement Today's skilled session continued to focus on gait/barriers wiht small base quad cane. Initiated outdoor surfaces this session with min guard assist for safety needed. Remainder of session continued to focus on balance training on compliant surfaces with up to min assist needed. The pt is making steady progress and should benefit from continued PT to progress toward unmet goals.    Personal Factors and Comorbidities Comorbidity 1;Past/Current Experience;Age;Profession    Comorbidities HTN    Examination-Activity Limitations Bathing;Bed Mobility;Carry;Hygiene/Grooming;Stand;Stairs;Squat;Transfers;Locomotion Level    Examination-Participation Restrictions Community Activity;Occupation   walking his dog   Stability/Clinical Decision Making Evolving/Moderate complexity    Rehab Potential Good    PT Frequency 1x / week    PT Duration --   5 WEEKS   PT Treatment/Interventions ADLs/Self Care Home Management;Aquatic Therapy;Electrical Stimulation;DME Instruction;Gait training;Stair training;Functional mobility training;Therapeutic activities;Therapeutic exercise;Balance training;Neuromuscular re-education;Wheelchair mobility training;Orthotic Fit/Training;Patient/family education;Passive range of motion;Energy conservation;Vestibular    PT Next Visit Plan check goals due to one visit left due to insurance limiations.    PT Home Exercise Plan [email or web address]    Consulted and Agree with Plan of Care Patient;Family member/caregiver    Family Member Consulted wife, April           Patient will  benefit from skilled therapeutic intervention in order to improve the following deficits and impairments:  Abnormal gait, Decreased activity tolerance, Decreased balance, Decreased mobility, Decreased knowledge of use of DME, Decreased coordination, Decreased range of motion, Decreased strength, Difficulty walking, Impaired tone, Impaired sensation, Postural dysfunction  Visit Diagnosis: Unsteadiness on feet  Muscle weakness (generalized)  Hemiplegia and hemiparesis following cerebral infarction affecting left non-dominant side [facility])     Problem List Patient Active Problem List   Diagnosis Date Noted  . History of CVA (cerebrovascular accident) 01/19/2020  . Normocytic anemia   . Benign essential HTN   . Hyponatremia   . Acute blood loss anemia   . Hypotension due to drugs   . Vascular headache   . Prerenal azotemia   . Sleep disturbance   . Leukocytosis   . Transaminitis   . Marijuana abuse   . Essential hypertension   . Dysphagia, post-stroke   . Left hemiparesis ([place])   . Acute respiratory failure ([place])   . ICH (intracerebral hemorrhage) ([place]) 12/06/2019    Willow Ora, PTA, [facility] [address], [place], [place] [address] [phone] 04/14/20, 10:41 AM   Name: Nivek Powley MRN: [id number] Date of Birth: [birth year]

## 2020-04-17 ENCOUNTER — Other Ambulatory Visit: Payer: Self-pay

## 2020-04-17 ENCOUNTER — Telehealth: Payer: BC Managed Care – PPO | Admitting: Family Medicine

## 2020-04-17 ENCOUNTER — Encounter: Payer: Self-pay | Admitting: Occupational Therapy

## 2020-04-17 ENCOUNTER — Ambulatory Visit: Payer: BC Managed Care – PPO | Attending: Physician Assistant | Admitting: Occupational Therapy

## 2020-04-17 DIAGNOSIS — R29818 Other symptoms and signs involving the nervous system: Secondary | ICD-10-CM | POA: Diagnosis not present

## 2020-04-17 DIAGNOSIS — I69354 Hemiplegia and hemiparesis following cerebral infarction affecting left non-dominant side: Secondary | ICD-10-CM | POA: Insufficient documentation

## 2020-04-17 DIAGNOSIS — R414 Neurologic neglect syndrome: Secondary | ICD-10-CM | POA: Insufficient documentation

## 2020-04-17 DIAGNOSIS — R208 Other disturbances of skin sensation: Secondary | ICD-10-CM | POA: Diagnosis not present

## 2020-04-17 DIAGNOSIS — R41842 Visuospatial deficit: Secondary | ICD-10-CM | POA: Insufficient documentation

## 2020-04-17 DIAGNOSIS — M6281 Muscle weakness (generalized): Secondary | ICD-10-CM

## 2020-04-17 DIAGNOSIS — R2681 Unsteadiness on feet: Secondary | ICD-10-CM | POA: Diagnosis not present

## 2020-04-17 NOTE — Therapy (Signed)
[facility] [address] [place], [place], [address] Phone: [phone]   Fax:  [phone]  Occupational Therapy Treatment  Patient Details  Name: Dennis Mercado MRN: [id number] Date of Birth: 1980-08-23 Referring Provider (OT): Dr. Letta Pate   Encounter Date: 04/17/2020   OT End of Session - 04/17/20 2014    Visit Number 13    Number of Visits 25    Date for OT Re-Evaluation 05/01/20    Authorization Type BCBS    Authorization Time Period 90 days- 30 VL combined for OT/ PT, will adjust frequency accordingly    Authorization - Visit Number 13    Authorization - Number of Visits 15    OT Start Time 5400    OT Stop Time 1630    OT Time Calculation (min) 45 min    Activity Tolerance Patient tolerated treatment well    Behavior During Therapy [facility] for tasks assessed/performed           History reviewed. No pertinent past medical history.  History reviewed. No pertinent surgical history.  There were no vitals filed for this visit.   Subjective Assessment - 04/17/20 2012    Subjective  Patient indicates he slept wrong on his arm and feels a bit of pain (2/10) in his left shoulder    Patient is accompanied by: Family member    Pertinent History Pt. presented to ED on 12/06/2019 with left side weakness and  altered mental status s/p coitus.  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 Pt required intubation 6/22 due to ARF secondary to aspiration PNA. Received inpatient rehab. Pt was discharged 7/28/2    Limitations HTN, left inattention, apraxia    Patient Stated Goals to be able to use my hand    Currently in Pain? Yes    Pain Score 2     Pain Location Shoulder    Pain Orientation Left    Pain Descriptors / Indicators Aching    Pain Type Acute pain    Pain Onset Today    Pain Frequency Intermittent    Aggravating Factors   sleeping on his right side    Pain Relieving Factors repositioning           Patient seen today for aquatic therapy session.  Patient entered and exited the pool via ramp and mod assist to faciltate improved weight shift to left side, step length, and decreased UE reliance for walking. Patient's session occurred in 3.5 ft of water.  Patient able to walk step through versus step to pattern with mod facilitation and cueing to assist with excessive weight shift toward right side.  Worked tor reduce posterior pelvis tilt to advance stiff left leg for swing phase.   Worked in supine to address left shoulder and elbow passive to active assisted range of motion.  Patient with reduced tension especially in left elbow flexion and GH internal rotation.   Worked at wall with UE's holding railing to provide slight traction and help with range of motion toward shoulder flexion, horizontal adduction.   Patient with improved stance time on left leg, step length on BLE, and decreased elbow flexion tension during walking at end of session.                        OT Short Term Goals - 04/17/20 2045      OT SHORT TERM GOAL #  1   Title I with inital HEP    Time 6    Period Weeks    Status Achieved      OT SHORT TERM GOAL #2   Title Pt will donn shirt with supervision and min v.c, and pants with min A    Time 6    Period Weeks    Status Achieved      OT SHORT TERM GOAL #3   Title Pt will attend to items in L hemibody space with no more than min v.c    Time 6    Period Weeks    Status Achieved      OT SHORT TERM GOAL #4   Title Pt will use LUE as stabilizer 10%of the time with min v.c    Time 6    Period Weeks    Status Achieved      OT SHORT TERM GOAL #5   Title Pt will transfer to toilet with close supervison    Time 6    Period Weeks    Status Achieved             OT Long Term Goals - 04/17/20 2046      OT LONG TERM GOAL #1   Title I with aquatic HEP    Time 12     Period Weeks    Status Revised      OT LONG TERM GOAL #2   Title Pt will donn shirt mod I and perfrom LB dressing with min A    Time 12    Period Weeks    Status Achieved      OT LONG TERM GOAL #3   Title Pt. will bathe with min A    Time 12    Period Weeks    Status Achieved   distant supervision     OT LONG TERM GOAL #4   Title Pt will demonstrate 40* A/ROM shoulder flexion in prep for reach    Time 12    Period Weeks    Status On-going   goes into abduction     OT LONG TERM GOAL #5   Title Pt will perform tabletop scanning activities with 75%better accuracy    Time 12    Period Weeks    Status Achieved      OT LONG TERM GOAL #6   Title Pt will demonstrate 50% elbow flexion/ extension in LUE  for functional use.    Time 12    Period Weeks    Status Partially Met   met in flexion, 25% in extension                Plan - 04/17/20 2015    Clinical Impression Statement Patient is showing decreased spasticity in left arm and leg following Botox injection    OT Occupational Profile and History Detailed Assessment- Review of Records and additional review of physical, cognitive, psychosocial history related to current functional performance    Occupational performance deficits (Please refer to evaluation for details): ADL's;IADL's;Leisure;Social Participation;Work;Play    Body Structure / Function / Physical Skills ADL;Balance;Endurance;Mobility;Strength;UE functional use;FMC;Vision;Coordination;Gait;Decreased knowledge of precautions;GMC;ROM;Decreased knowledge of use of DME;Dexterity;IADL;Sensation    Cognitive Skills Attention;Memory;Problem Solve;Safety Awareness;Thought;Understand    Rehab Potential Fair    Clinical Decision Making Limited treatment options, no task modification necessary    Comorbidities Affecting Occupational Performance: May have comorbidities impacting occupational performance    Modification or Assistance to Complete Evaluation  No  modification of tasks or assist  necessary to complete eval    OT Frequency 2x / week    OT Treatment/Interventions Self-care/ADL training;Therapeutic exercise;Functional Mobility Training;Balance training;Splinting;Manual Therapy;Neuromuscular education;Aquatic Therapy;Ultrasound;Energy conservation;Therapeutic activities;Cryotherapy;Paraffin;DME and/or AE instruction;Cognitive remediation/compensation;Visual/perceptual remediation/compensation;Gait Training;Fluidtherapy;Electrical Stimulation;Moist Heat;Contrast Bath;Passive range of motion;Patient/family education    Plan Aquatic therapy to address LUE functioning, decrease tension, and improve functional mobility.  Need scap mobility, and reduced tension throughout LUE/trunk    Consulted and Agree with Plan of Care Patient;Family member/caregiver    Family Member Consulted wife April           Patient will benefit from skilled therapeutic intervention in order to improve the following deficits and impairments:   Body Structure / Function / Physical Skills: ADL, Balance, Endurance, Mobility, Strength, UE functional use, FMC, Vision, Coordination, Gait, Decreased knowledge of precautions, GMC, ROM, Decreased knowledge of use of DME, Dexterity, IADL, Sensation Cognitive Skills: Attention, Memory, Problem Solve, Safety Awareness, Thought, Understand     Visit Diagnosis: Unsteadiness on feet  Muscle weakness (generalized)  Hemiplegia and hemiparesis following cerebral infarction affecting left non-dominant side (HCC)  Other symptoms and signs involving the nervous system  Other disturbances of skin sensation  Visuospatial deficit  Neurologic neglect syndrome    Problem List Patient Active Problem List   Diagnosis Date Noted  . History of CVA (cerebrovascular accident) 01/19/2020  . Normocytic anemia   . Benign essential HTN   . Hyponatremia   . Acute blood loss anemia   . Hypotension due to drugs   . Vascular headache   .  Prerenal azotemia   . Sleep disturbance   . Leukocytosis   . Transaminitis   . Marijuana abuse   . Essential hypertension   . Dysphagia, post-stroke   . Left hemiparesis ([place])   . Acute respiratory failure ([place])   . ICH (intracerebral hemorrhage) ([place]) 12/06/2019    Mariah Milling, OTR/L 04/17/2020, 8:49 PM  [facility] [address] [place], [place], [address] Phone: [phone]   Fax:  [phone]  Name: Dennis Mercado MRN: [id number] Date of Birth: 1980-12-10

## 2020-04-18 ENCOUNTER — Telehealth: Payer: Self-pay | Admitting: Family Medicine

## 2020-04-18 ENCOUNTER — Telehealth (INDEPENDENT_AMBULATORY_CARE_PROVIDER_SITE_OTHER): Payer: BC Managed Care – PPO | Admitting: Family Medicine

## 2020-04-18 ENCOUNTER — Encounter: Payer: Self-pay | Admitting: Family Medicine

## 2020-04-18 VITALS — BP 128/77 | Temp 98.0°F | Ht 67.0 in | Wt 144.0 lb

## 2020-04-18 DIAGNOSIS — E785 Hyperlipidemia, unspecified: Secondary | ICD-10-CM | POA: Diagnosis not present

## 2020-04-18 DIAGNOSIS — I10 Essential (primary) hypertension: Secondary | ICD-10-CM

## 2020-04-18 DIAGNOSIS — E1169 Type 2 diabetes mellitus with other specified complication: Secondary | ICD-10-CM

## 2020-04-18 DIAGNOSIS — Z8673 Personal history of transient ischemic attack (TIA), and cerebral infarction without residual deficits: Secondary | ICD-10-CM

## 2020-04-18 NOTE — Telephone Encounter (Signed)
Dennis Mercado, ph # [phone]  Pt called and said that he forgot to ask if he should come off the cholesterol med/atorvastatin since his levels were good. If yes, does he need to wean off of it?

## 2020-04-18 NOTE — Telephone Encounter (Signed)
Please advise message below  °

## 2020-04-18 NOTE — Progress Notes (Signed)
Established Patient Office Visit  Subjective:  Patient ID: Dennis Mercado, male    DOB: 1980/08/03  Age: 39 y.o. MRN: [id number]  CC:  Chief Complaint  Patient presents with  . Follow-up    4 week follow up patient would like to know if it would be okay to have a alcohol drink.     HPI Arnold Kester presents for follow-up of his blood pressure status post tapering off of the clonidine.  Pressure at home is been running in the 120s over 70 without clonidine.  He denies any headaches blurred vision or lightheadedness.  He continues to work with physical rehabilitation.  He is scheduled to follow-up with neurology next month.  He asks about the possibility of bariatric introducing alcohol into his lifestyle and seeks my opinion about this matter.   History reviewed. No pertinent surgical history.  History reviewed. No pertinent family history.  Social History   Socioeconomic History  . Marital status: Married    Spouse name: Not on file  . Number of children: Not on file  . Years of education: Not on file  . Highest education level: Not on file  Occupational History  . Not on file  Tobacco Use  . Smoking status: Never Smoker  . Smokeless tobacco: Never Used  Substance and Sexual Activity  . Alcohol use: Yes    Comment: several drinks over the weekend  . Drug use: Never  . Sexual activity: Not on file  Other Topics Concern  . Not on file  Social History Narrative  . Not on file   Social Determinants of Health   Financial Resource Strain:   . Difficulty of Paying Living Expenses: Not on file  Food Insecurity:   . Worried About Charity fundraiser in the Last Year: Not on file  . Ran Out of Food in the Last Year: Not on file  Transportation Needs:   . Lack of Transportation (Medical): Not on file  . Lack of Transportation (Non-Medical): Not on file  Physical Activity:   . Days of Exercise per Week: Not on file  . Minutes of Exercise per Session: Not on file   Stress:   . Feeling of Stress : Not on file  Social Connections:   . Frequency of Communication with Friends and Family: Not on file  . Frequency of Social Gatherings with Friends and Family: Not on file  . Attends Religious Services: Not on file  . Active Member of Clubs or Organizations: Not on file  . Attends Archivist Meetings: Not on file  . Marital Status: Not on file  Intimate Partner Violence:   . Fear of Current or Ex-Partner: Not on file  . Emotionally Abused: Not on file  . Physically Abused: Not on file  . Sexually Abused: Not on file    Outpatient Medications Prior to Visit  Medication Sig Dispense Refill  . acetaminophen (TYLENOL) 325 MG tablet Take 2 tablets (650 mg total) by mouth every 4 (four) hours as needed for mild pain (or temp > 37.5 C (99.5 F)).    Marland Kitchen amLODipine (NORVASC) 10 MG tablet Take 1 tablet (10 mg total) by mouth daily. 90 tablet 0  . atorvastatin (LIPITOR) 40 MG tablet Take 1 tablet (40 mg total) by mouth daily. 90 tablet 0  . carvedilol (COREG) 25 MG tablet TAKE 1 TABLET(25 MG) BY MOUTH TWICE DAILY WITH A MEAL 90 tablet 0  . levETIRAcetam (KEPPRA) 500 MG tablet Take 1 tablet (  500 mg total) by mouth 2 (two) times daily. 180 tablet 3  . lisinopril (ZESTRIL) 5 MG tablet Take 1 tablet (5 mg total) by mouth daily. 90 tablet 0  . omeprazole (PRILOSEC OTC) 20 MG tablet Take 20 mg by mouth daily.      No facility-administered medications prior to visit.    Allergies  Allergen Reactions  . Percocet [Oxycodone-Acetaminophen] Itching    Pt says he has previously tolerated.     ROS Review of Systems  Constitutional: Negative.   Respiratory: Negative.   Cardiovascular: Negative.   Neurological: Positive for facial asymmetry and weakness. Negative for dizziness, tremors, seizures, speech difficulty, light-headedness and headaches.      Objective:    Physical Exam Vitals and nursing note reviewed.  Constitutional:      General: He is not  in acute distress.    Appearance: Normal appearance. He is normal weight. He is not ill-appearing, toxic-appearing or diaphoretic.  HENT:     Head: Normocephalic and atraumatic.     Right Ear: External ear normal.     Left Ear: External ear normal.  Eyes:     General: No scleral icterus.       Right eye: No discharge.        Left eye: No discharge.     Conjunctiva/sclera: Conjunctivae normal.  Pulmonary:     Effort: Pulmonary effort is normal.  Neurological:     Mental Status: He is alert.     Cranial Nerves: Cranial nerve deficit and facial asymmetry present. No dysarthria.     Comments: Left Vll nerve palsy persists.   Psychiatric:        Mood and Affect: Mood normal.        Behavior: Behavior normal.     BP 128/77   Temp 98 F (36.7 C) (Tympanic)   Ht 5\' 7"  (1.702 m)   Wt 144 lb (65.3 kg)   BMI 22.55 kg/m  Wt Readings from Last 3 Encounters:  04/18/20 144 lb (65.3 kg)  04/06/20 145 lb (65.8 kg)  03/16/20 141 lb (64 kg)     Health Maintenance Due  Topic Date Due  . Hepatitis C Screening  Never done  . COVID-19 Vaccine (1) Never done  . TETANUS/TDAP  Never done    There are no preventive care reminders to display for this patient.  No results found for: TSH Lab Results  Component Value Date   WBC 7.9 03/28/2020   HGB 13.0 03/28/2020   HCT 40.2 03/28/2020   MCV 82.3 03/28/2020   PLT 373.0 03/28/2020   Lab Results  Component Value Date   NA 139 03/28/2020   K 4.6 03/28/2020   CO2 31 03/28/2020   GLUCOSE 88 03/28/2020   BUN 16 03/28/2020   CREATININE 1.33 03/28/2020   BILITOT 0.6 03/28/2020   ALKPHOS 79 03/28/2020   AST 12 03/28/2020   ALT 18 03/28/2020   PROT 7.6 03/28/2020   ALBUMIN 4.9 03/28/2020   CALCIUM 10.4 03/28/2020   ANIONGAP 10 01/10/2020   GFR 66.79 03/28/2020   Lab Results  Component Value Date   CHOL 115 03/28/2020   Lab Results  Component Value Date   HDL 32.10 (L) 03/28/2020   Lab Results  Component Value Date   LDLCALC  66 03/28/2020   Lab Results  Component Value Date   TRIG 84.0 03/28/2020   Lab Results  Component Value Date   CHOLHDL 4 03/28/2020   Lab Results  Component Value Date  HGBA1C 5.6 12/06/2019      Assessment & Plan:   Problem List Items Addressed This Visit      Cardiovascular and Mediastinum   Essential hypertension - Primary     Other   History of CVA (cerebrovascular accident)    Other Visit Diagnoses    Hyperlipidemia associated with type 2 diabetes mellitus ([place])          No orders of the defined types were placed in this encounter.   Follow-up: Return in about 6 months (around 10/16/2020).  Blood pressure currently controlled with amlodipine carvedilol and low-dose lisinopril.  Clonidine no longer needed.  LDL cholesterol is well controlled.  I advised patient that her whole emphasis right now is on his recovery and also the prevention of any further events.  In order to assure that it would include avoiding any alcohol, smoking or illicit drug use.  He assured me that that would not be a problem.  Continue working with physical medicine and follow-up with neurology.  Continue all medicines as directed.  Virtual Visit via Video Note  I connected with Sherwood Gambler on 04/18/20 at 10:30 AM EDT by a video enabled telemedicine application and verified that I am speaking with the correct person using two identifiers.  Location: Patient: home alone Provider: home   I discussed the limitations of evaluation and management by telemedicine and the availability of in person appointments. The patient expressed understanding and agreed to proceed.  History of Present Illness:    Observations/Objective:   Assessment and Plan:   Follow Up Instructions:    I discussed the assessment and treatment plan with the patient. The patient was provided an opportunity to ask questions and all were answered. The patient agreed with the plan and demonstrated an understanding of  the instructions.   The patient was advised to call back or seek an in-person evaluation if the symptoms worsen or if the condition fails to improve as anticipated.  I provided 15 minutes of non-face-to-face time during this encounter.   Libby Maw, MD   Libby Maw, MD

## 2020-04-18 NOTE — Telephone Encounter (Signed)
Patient has diabetes.  He is at higher risk for cardiovascular disease.  He needs to stay on his statin.  If he stops it his cholesterol will increase again.

## 2020-04-20 ENCOUNTER — Ambulatory Visit: Payer: BC Managed Care – PPO | Admitting: Physical Therapy

## 2020-04-20 ENCOUNTER — Other Ambulatory Visit: Payer: Self-pay

## 2020-04-20 DIAGNOSIS — M6281 Muscle weakness (generalized): Secondary | ICD-10-CM | POA: Diagnosis not present

## 2020-04-20 DIAGNOSIS — I69354 Hemiplegia and hemiparesis following cerebral infarction affecting left non-dominant side: Secondary | ICD-10-CM | POA: Diagnosis not present

## 2020-04-20 DIAGNOSIS — R41842 Visuospatial deficit: Secondary | ICD-10-CM | POA: Diagnosis not present

## 2020-04-20 DIAGNOSIS — R2681 Unsteadiness on feet: Secondary | ICD-10-CM | POA: Diagnosis not present

## 2020-04-20 DIAGNOSIS — R29818 Other symptoms and signs involving the nervous system: Secondary | ICD-10-CM

## 2020-04-20 DIAGNOSIS — R414 Neurologic neglect syndrome: Secondary | ICD-10-CM | POA: Diagnosis not present

## 2020-04-20 DIAGNOSIS — R208 Other disturbances of skin sensation: Secondary | ICD-10-CM | POA: Diagnosis not present

## 2020-04-20 NOTE — Patient Instructions (Signed)
Access Code: [email or web address] URL: [email or web address] Date: 04/20/2020 Prepared by: Janann August  Exercises Supine Heel Slide - 1-2 x daily - 1-2 sets - 10 reps Supine Short Arc Quad - 1-2 x daily - 5 x weekly - 2 sets - 5 reps Supine Hip Adduction Isometric with Ball - 1-2 x daily - 5 x weekly - 2 sets - 10 reps Supine Hamstring Stretch with Caregiver - 2 x daily - 7 x weekly - 3 sets - 30 hold Supine Ankle Dorsiflexion Stretch with Caregiver - 2 x daily - 7 x weekly - 3 sets - 30 hold Supine March with Resistance Band - 1-2 x daily - 5 x weekly - 1-2 sets - 10 reps Hooklying Single Leg Bent Knee Fallouts with Resistance - 1-2 x daily - 5 x weekly - 1-2 sets - 10 reps Sit to Stand with Armchair - 1 x daily - 5 x weekly - 2 sets - 5 reps Small Range Straight Leg Raise - 1 x daily - 5 x weekly - 2 sets - 5 reps Marching Bridge - 1 x daily - 5 x weekly - 2 sets - 5 reps

## 2020-04-20 NOTE — Therapy (Addendum)
[facility] [address] [place], [place], [address] Phone: [phone]   Fax:  [phone]  Physical Therapy Treatment/Discharge Summary  Patient Details  Name: Dennis Mercado MRN: [id number] Date of Birth: [birth year] Referring Provider (PT): Angiulli, Chapman Fitch (will be followed by Dr. Letta Pate)   Encounter Date: 04/20/2020   PT End of Session - 04/20/20 2034    Visit Number 15    Number of Visits 15    Date for PT Re-Evaluation 04/28/20    Authorization Type BCBS, VL for PT and OT = 30 (zero used, if seen on same day, will count as 2 visits), VL for ST = 30    Authorization - Visit Number 15    Authorization - Number of Visits 15    PT Start Time 0848    PT Stop Time 0935    PT Time Calculation (min) 47 min    Equipment Utilized During Treatment Gait belt    Activity Tolerance Patient tolerated treatment well    Behavior During Therapy [facility] for tasks assessed/performed           No past medical history on file.  No past surgical history on file.  There were no vitals filed for this visit.   Subjective Assessment - 04/20/20 0852    Subjective Pool is going well. No falls.    Pertinent History HTN    Patient Stated Goals wants to start walking as soon as possible - "less walker and downgrade to a cane and then nothing at all"    Currently in Pain? No/denies                      04/20/20 0001  Transfers  Transfers Sit to Stand;Stand to Sit  Sit to Stand 5: Supervision;From bed;From chair/3-in-1;With upper extremity assist;Without upper extremity assist  Five time sit to stand comments  30.5 seconds from mat table with single UE support  Stand to Sit 5: Supervision;With upper extremity assist;To chair/3-in-1;To bed  Comments cues to place feet equal distance apart vs. having RLE staggered posteriorly   Ambulation/Gait  Ambulation/Gait Yes  Ambulation/Gait Assistance 5: Supervision;4: Min  guard  Ambulation/Gait Assistance Details pt with incr L knee flexion with gait during stance phase, cues intermittently for L quad activation  Ambulation Distance (Feet) 115 Feet  Assistive device Small based quad cane  Gait Pattern Decreased stance time - left;Decreased hip/knee flexion - left;Decreased weight shift to left;Left flexed knee in stance;Narrow base of support;Step-through pattern;Decreased arm swing - left  Ambulation Surface Level;Indoor  Gait velocity 36.03 secs = .91 ft/sec  Therapeutic Activites   Therapeutic Activities Other Therapeutic Activities  Other Therapeutic Activities discussed with pt and pt's spouse that will discharge pt from PT at this time (due to pt's insurance ending with PT visits), but will resume in the new year when pt goes on his wife's insurance. discussed that pt will need a new referral from his physician for a return eval, both verbalized understanding              Access Code: [email or web address] URL: [email or web address] Date: 04/20/2020 Prepared by: Janann August  Finalized pt's HEP - see MedBridge for more details.   Exercises Supine Heel Slide - 1-2 x daily - 1-2 sets - 10 reps Supine Short Arc Quad - 1-2 x daily - 5 x weekly - 2 sets - 5 reps Supine Hip Adduction Isometric with Ball - 1-2 x daily - 5 x weekly -  2 sets - 10 reps Supine Hamstring Stretch with Caregiver - 2 x daily - 7 x weekly - 3 sets - 30 hold Supine Ankle Dorsiflexion Stretch with Caregiver - 2 x daily - 7 x weekly - 3 sets - 30 hold Supine March with Resistance Band - 1-2 x daily - 5 x weekly - 1-2 sets - 10 reps Hooklying Single Leg Bent Knee Fallouts with Resistance - 1-2 x daily - 5 x weekly - 1-2 sets - 10 reps -with use of green tband Sit to Stand with Armchair - 1 x daily - 5 x weekly - 2 sets - 5 reps Small Range Straight Leg Raise - 1 x daily - 5 x weekly - 2 sets - 5 reps - verbal and tactile cues to perform with quad set first Marching Bridge -  1 x daily - 5 x weekly - 2 sets - 5 reps       PT Education - 04/20/20 2033    Education Details see TA. and reviewed/updated final HEP    Person(s) Educated Patient;Spouse    Methods Explanation;Demonstration;Tactile cues;Handout    Comprehension Verbalized understanding;Returned demonstration            PT Short Term Goals - 02/25/20 1427      PT SHORT TERM GOAL #1   Title Pt and pt's spouse will be independent with initial HEP in order to build upon functional gains made in therapy. ALL STGS DUE 02/15/20    Time 4    Period Weeks    Status Achieved    Target Date 02/15/20      PT SHORT TERM GOAL #2   Title Pt will perform squat pivot vs. stand pivot with supervision in order to improve functional mobility and decr caregiver burden.    Baseline squat pivot with min guard, stand pivot with min A    Time 4    Period Weeks    Status Not Met      PT SHORT TERM GOAL #3   Title Pt will perform sit <> stands with RW with supervision and pt able to place LUE in L hand orthosis with supervision in order to improve functional mobility.    Baseline supervision/min guard    Time 4    Period Weeks    Status Partially Met      PT SHORT TERM GOAL #4   Title Pt will ambulate at least 100' with RW and min guard in order to improve household mobility.    Time 4    Period Weeks    Status Achieved      PT SHORT TERM GOAL #5   Title Pt will perform standing balance with single UE support at sink for 5 minutes with supervision in order to incr tolerance for ADLs.    Baseline 5 minutes for dynamic tasks with single/no UE support at sink    Time 4    Period Weeks    Status Achieved            PT Long Term Goals - 04/23/20 2044      PT LONG TERM GOAL #1   Title Pt and pt's spouse will be independent with final HEP in order to build upon functional gains made in therapy. ALL LTGS DUE 04/21/20    Baseline finalized HEP    Time 5    Period Weeks    Status Achieved      PT LONG  TERM GOAL #2   Title  Pt will ambulate at least 230' with RW vs. LRAD and supervision over unlevel surfaces in order to improve functional mobility.    Baseline with small base cane - min guard on unlevel surfaces, with RW mod I    Time 5    Period Weeks    Status Partially Met      PT LONG TERM GOAL #3   Title Gait speed to be assessed with LTG to be written as appropriate in order to demo improved gait efficiency.    Baseline .91 ft/sec with small base quad cane    Time 5    Period Weeks    Status Achieved      PT LONG TERM GOAL #4   Title Pt will perform 2 steps with single HHA vs. LRAD with min guard in order to safely enter home.    Baseline have not yet performed stairs.    Time 5    Period Weeks    Status Deferred      PT LONG TERM GOAL #5   Title Pt will perform dynamic standing activites for 5 minutes with no UE support with min guard in order to demo improved balance for ADLs.    Baseline not assessed due to time constraints.    Time 5    Period Weeks    Status Deferred            PHYSICAL THERAPY DISCHARGE SUMMARY  Visits from Start of Care: 15  Current functional level related to goals / functional outcomes: See LTGs.   Remaining deficits: L hemiparesis, incr tone, decr LLE strength, impaired balance, gait abnormalities.   Education / Equipment: HEP  Plan: Patient agrees to discharge.  Patient goals were met. Patient is being discharged due to financial reasons.  ?????         *Pt discharged due to visit limit with insurance.      04/23/20 2055  Plan  Clinical Impression Statement Focus of today's skilled session was assessing pt's LTGs today for D/C (due to pt's visit limit with PT/OT). Reviewed and finalized pt's HEP for LLE strengthening and ROM with wife to assist as needed. Pt able to ambulate with small base cane over indoor surfaces with a gait speed of .91 ft/sec with supervision/min guard at times and outdoors over unlevel surfaces with  min guard. Did not get to assess other goals due to time constraints. Pt continues to need cues at times to attend to LLE foot placement with sit <> stands to make sure feet are equal distance apart (vs. Having RLE posteriorly). Pt will return for a PT eval in the new year once pt gets on his wife's insurance. Discussed that pt would need a new referral from the physician, both verbalized understanding.  Personal Factors and Comorbidities Comorbidity 1;Past/Current Experience;Age;Profession  Comorbidities HTN  Examination-Activity Limitations Bathing;Bed Mobility;Carry;Hygiene/Grooming;Stand;Stairs;Squat;Transfers;Locomotion Level  Examination-Participation Restrictions Community Activity;Occupation (walking his dog)  Pt will benefit from skilled therapeutic intervention in order to improve on the following deficits Abnormal gait;Decreased activity tolerance;Decreased balance;Decreased mobility;Decreased knowledge of use of DME;Decreased coordination;Decreased range of motion;Decreased strength;Difficulty walking;Impaired tone;Impaired sensation;Postural dysfunction  Stability/Clinical Decision Making Evolving/Moderate complexity  Rehab Potential Good  PT Frequency 1x / week  PT Duration  (5 WEEKS)  PT Treatment/Interventions ADLs/Self Care Home Management;Aquatic Therapy;Electrical Stimulation;DME Instruction;Gait training;Stair training;Functional mobility training;Therapeutic activities;Therapeutic exercise;Balance training;Neuromuscular re-education;Wheelchair mobility training;Orthotic Fit/Training;Patient/family education;Passive range of motion;Energy conservation;Vestibular  PT Next Visit Plan D/C  PT Home Exercise Plan [email or web address]  Consulted and Agree  with Plan of Care Patient;Family member/caregiver  Family Member Consulted wife, April     Patient will benefit from skilled therapeutic intervention in order to improve the following deficits and impairments:     Visit  Diagnosis: Unsteadiness on feet  Muscle weakness (generalized)  Hemiplegia and hemiparesis following cerebral infarction affecting left non-dominant side ([place])  Other symptoms and signs involving the nervous system     Problem List Patient Active Problem List   Diagnosis Date Noted  . History of CVA (cerebrovascular accident) 01/19/2020  . Normocytic anemia   . Benign essential HTN   . Hyponatremia   . Acute blood loss anemia   . Hypotension due to drugs   . Vascular headache   . Prerenal azotemia   . Sleep disturbance   . Leukocytosis   . Transaminitis   . Marijuana abuse   . Essential hypertension   . Dysphagia, post-stroke   . Left hemiparesis ([place])   . Acute respiratory failure ([place])   . ICH (intracerebral hemorrhage) ([place]) 12/06/2019    Arliss Journey , PT, DPT  04/20/2020, 8:35 PM  [facility] [address] [place], [place], [address] Phone: [phone]   Fax:  [phone]  Name: Dennis Mercado MRN: [id number] Date of Birth: 1981/02/04

## 2020-04-20 NOTE — Telephone Encounter (Signed)
Called patient to inform of message below no answer LMTCB

## 2020-04-21 NOTE — Telephone Encounter (Signed)
Patient was informed of Dr. Bebe Shaggy message.

## 2020-04-24 ENCOUNTER — Encounter: Payer: Self-pay | Admitting: Occupational Therapy

## 2020-04-24 ENCOUNTER — Ambulatory Visit: Payer: BC Managed Care – PPO | Admitting: Occupational Therapy

## 2020-04-24 DIAGNOSIS — M6281 Muscle weakness (generalized): Secondary | ICD-10-CM | POA: Diagnosis not present

## 2020-04-24 DIAGNOSIS — R41842 Visuospatial deficit: Secondary | ICD-10-CM | POA: Diagnosis not present

## 2020-04-24 DIAGNOSIS — R2681 Unsteadiness on feet: Secondary | ICD-10-CM | POA: Diagnosis not present

## 2020-04-24 DIAGNOSIS — R414 Neurologic neglect syndrome: Secondary | ICD-10-CM | POA: Diagnosis not present

## 2020-04-24 DIAGNOSIS — R29818 Other symptoms and signs involving the nervous system: Secondary | ICD-10-CM

## 2020-04-24 DIAGNOSIS — R208 Other disturbances of skin sensation: Secondary | ICD-10-CM

## 2020-04-24 DIAGNOSIS — I69354 Hemiplegia and hemiparesis following cerebral infarction affecting left non-dominant side: Secondary | ICD-10-CM

## 2020-04-24 NOTE — Therapy (Signed)
[facility] [address] [place], [place], [address] Phone: [phone]   Fax:  [phone]  Occupational Therapy Treatment  Patient Details  Name: Dennis Mercado MRN: [id number] Date of Birth: 11-Jul-1980 Referring Provider (OT): Dr. Letta Pate   Encounter Date: 04/24/2020   OT End of Session - 04/24/20 1847    Visit Number 14    Number of Visits 25    Date for OT Re-Evaluation 05/01/20    Authorization Type BCBS    Authorization Time Period 90 days- 30 VL combined for OT/ PT, will adjust frequency accordingly    Authorization - Visit Number 14    Authorization - Number of Visits 15    OT Start Time 3668    OT Stop Time 1630    OT Time Calculation (min) 45 min    Activity Tolerance Patient tolerated treatment well    Behavior During Therapy [facility] for tasks assessed/performed           History reviewed. No pertinent past medical history.  History reviewed. No pertinent surgical history.  There were no vitals filed for this visit.   Subjective Assessment - 04/24/20 1842    Subjective  Patient indicates he feels looser after pool therapy.  He indicates he is trying to step with his feet not so wide.    Patient is accompanied by: Family member    Pertinent History Pt. presented to ED on 12/06/2019 with left side weakness and  altered mental status s/p coitus.  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 Pt required intubation 6/22 due to ARF secondary to aspiration PNA. Received inpatient rehab. Pt was discharged 7/28/2    Limitations HTN, left inattention, apraxia    Patient Stated Goals to be able to use my hand    Currently in Pain? No/denies    Pain Score 0-No pain            Patient seen for aquatic therapy.  Patient entered pool via stairs with right railing, and exited via ramp without use of hand rail.   Patient's  treatment occurred in 3.5-4 ft of water.  Warm up with walking - with emphasis on postural alignment and control, as well as reducing step width and increasing step length.   Patient with significant tightness in LLE, and initially had difficulty with mid to late stance.  With slow stretch in stride, able to trail left leg behind right, then focused on release of knee prior to swing to reduce posterior pelvis tilt to bring left leg through.  Patient able to heel strike with mod facilitation to align trunk and reduce excessive lateral weight shift.  Patient showing significant improvement in balance with walking without UE support in pool.   In supported supine position worked to increase shoulder and elbow range of motion. Patient able to achieve at least 90 degrees of Rebecca abduction today and had active movement in that range once shoulder joint stretched.   Body on arm motion effective to allow slight increase in shoulder range - patient with emphasis on postural control and scapular alignment.  Followed with bilateral grasp on railing for self stretch on shoulders.                        OT Short Term Goals - 04/24/20 1849      OT SHORT TERM GOAL #1   Title I with  inital HEP    Time 6    Period Weeks    Status Achieved      OT SHORT TERM GOAL #2   Title Pt will donn shirt with supervision and min v.c, and pants with min A    Time 6    Period Weeks    Status Achieved      OT SHORT TERM GOAL #3   Title Pt will attend to items in L hemibody space with no more than min v.c    Time 6    Period Weeks    Status Achieved      OT SHORT TERM GOAL #4   Title Pt will use LUE as stabilizer 10%of the time with min v.c    Time 6    Period Weeks    Status Achieved      OT SHORT TERM GOAL #5   Title Pt will transfer to toilet with close supervison    Time 6    Period Weeks    Status Achieved             OT Long Term Goals - 04/24/20 1849      OT LONG TERM GOAL #1    Title I with aquatic HEP    Time 12    Period Weeks    Status Revised      OT LONG TERM GOAL #2   Title Pt will donn shirt mod I and perfrom LB dressing with min A    Time 12    Period Weeks    Status Achieved      OT LONG TERM GOAL #3   Title Pt. will bathe with min A    Time 12    Period Weeks    Status Achieved   distant supervision     OT LONG TERM GOAL #4   Title Pt will demonstrate 40* A/ROM shoulder flexion in prep for reach    Time 12    Period Weeks    Status On-going   goes into abduction     OT LONG TERM GOAL #5   Title Pt will perform tabletop scanning activities with 75%better accuracy    Time 12    Period Weeks    Status Achieved      OT LONG TERM GOAL #6   Title Pt will demonstrate 50% elbow flexion/ extension in LUE  for functional use.    Time 12    Period Weeks    Status Partially Met   met in flexion, 25% in extension                Plan - 04/24/20 1848    Clinical Impression Statement Patient is showing decreased spasticity in left arm and leg following Botox injection    OT Occupational Profile and History Detailed Assessment- Review of Records and additional review of physical, cognitive, psychosocial history related to current functional performance    Occupational performance deficits (Please refer to evaluation for details): ADL's;IADL's;Leisure;Social Participation;Work;Play    Body Structure / Function / Physical Skills ADL;Balance;Endurance;Mobility;Strength;UE functional use;FMC;Vision;Coordination;Gait;Decreased knowledge of precautions;GMC;ROM;Decreased knowledge of use of DME;Dexterity;IADL;Sensation    Cognitive Skills Attention;Memory;Problem Solve;Safety Awareness;Thought;Understand    Rehab Potential Fair    Clinical Decision Making Limited treatment options, no task modification necessary    Comorbidities Affecting Occupational Performance: May have comorbidities impacting occupational performance    Modification or Assistance  to Complete Evaluation  No modification of tasks or assist necessary to complete eval  OT Frequency 2x / week   ° OT Treatment/Interventions Self-care/ADL training;Therapeutic exercise;Functional Mobility Training;Balance training;Splinting;Manual Therapy;Neuromuscular education;Aquatic Therapy;Ultrasound;Energy conservation;Therapeutic activities;Cryotherapy;Paraffin;DME and/or AE instruction;Cognitive remediation/compensation;Visual/perceptual remediation/compensation;Gait Training;Fluidtherapy;Electrical Stimulation;Moist Heat;Contrast Bath;Passive range of motion;Patient/family education   ° Plan Aquatic therapy to address LUE functioning, decrease tension, and improve functional mobility.  Need scap mobility, and reduced tension throughout LUE/trunk   ° Consulted and Agree with Plan of Care Patient;Family member/caregiver   ° Family Member Consulted wife April   °  °  °  ° ° °Patient will benefit from skilled therapeutic intervention in order to improve the following deficits and impairments:   °Body Structure / Function / Physical Skills: ADL, Balance, Endurance, Mobility, Strength, UE functional use, FMC, Vision, Coordination, Gait, Decreased knowledge of precautions, GMC, ROM, Decreased knowledge of use of DME, Dexterity, IADL, Sensation °Cognitive Skills: Attention, Memory, Problem Solve, Safety Awareness, Thought, Understand °  ° ° °Visit Diagnosis: °Hemiplegia and hemiparesis following cerebral infarction affecting left non-dominant side (HCC) ° °Muscle weakness (generalized) ° °Unsteadiness on feet ° °Visuospatial deficit ° °Other symptoms and signs involving the nervous system ° °Other disturbances of skin sensation ° ° ° °Problem List °Patient Active Problem List  ° Diagnosis Date Noted  °• History of CVA (cerebrovascular accident) 01/19/2020  °• Normocytic anemia   °• Benign essential HTN   °• Hyponatremia   °• Acute blood loss anemia   °• Hypotension due to drugs   °• Vascular headache   °•  Prerenal azotemia   °• Sleep disturbance   °• Leukocytosis   °• Transaminitis   °• Marijuana abuse   °• Essential hypertension   °• Dysphagia, post-stroke   °• Left hemiparesis (HCC)   °• Acute respiratory failure (HCC)   °• ICH (intracerebral hemorrhage) (HCC) 12/06/2019  ° ° °,  M, OTR/L °04/24/2020, 6:50 PM ° °[facility] °[facility] °[address] °[place], [place], [address] °Phone: [phone]   Fax:  [phone] ° °Name: Dennis Mercado °MRN: [id number] °Date of Birth: 08/25/1980 °

## 2020-04-30 ENCOUNTER — Other Ambulatory Visit: Payer: Self-pay | Admitting: Family Medicine

## 2020-05-01 ENCOUNTER — Ambulatory Visit: Payer: BC Managed Care – PPO | Admitting: Occupational Therapy

## 2020-05-01 ENCOUNTER — Encounter: Payer: Self-pay | Admitting: Occupational Therapy

## 2020-05-01 DIAGNOSIS — R208 Other disturbances of skin sensation: Secondary | ICD-10-CM

## 2020-05-01 DIAGNOSIS — M6281 Muscle weakness (generalized): Secondary | ICD-10-CM

## 2020-05-01 DIAGNOSIS — R29818 Other symptoms and signs involving the nervous system: Secondary | ICD-10-CM

## 2020-05-01 DIAGNOSIS — R41842 Visuospatial deficit: Secondary | ICD-10-CM

## 2020-05-01 DIAGNOSIS — R2681 Unsteadiness on feet: Secondary | ICD-10-CM | POA: Diagnosis not present

## 2020-05-01 DIAGNOSIS — I69354 Hemiplegia and hemiparesis following cerebral infarction affecting left non-dominant side: Secondary | ICD-10-CM

## 2020-05-01 DIAGNOSIS — R414 Neurologic neglect syndrome: Secondary | ICD-10-CM | POA: Diagnosis not present

## 2020-05-01 NOTE — Therapy (Signed)
[facility] [address] [place], [place], [address] Phone: [phone]   Fax:  [phone]  Occupational Therapy Treatment  Patient Details  Name: Dennis Mercado MRN: [id number] Date of Birth: 05-29-1981 Referring Provider (OT): Dr. Letta Pate   Encounter Date: 05/01/2020   OT End of Session - 05/01/20 1816    Visit Number 15    Number of Visits 25    Date for OT Re-Evaluation 05/01/20    Authorization Type BCBS    Authorization Time Period 90 days- 30 VL combined for OT/ PT, will adjust frequency accordingly    Authorization - Visit Number 15    Authorization - Number of Visits 15    OT Start Time 1607    OT Stop Time 1630    OT Time Calculation (min) 50 min    Activity Tolerance Patient tolerated treatment well    Behavior During Therapy [facility] for tasks assessed/performed           History reviewed. No pertinent past medical history.  History reviewed. No pertinent surgical history.  There were no vitals filed for this visit.   Subjective Assessment - 05/01/20 1814    Subjective  Patient indicates that he has been doing arm and leg exercises at home.  He is signed up to participate in [facility] - currently on the waiting list.    Patient is accompanied by: Family member    Pertinent History Pt. presented to ED on 12/06/2019 with left side weakness and  altered mental status s/p coitus.  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 Pt required intubation 6/22 due to ARF secondary to aspiration PNA. Received inpatient rehab. Pt was discharged 7/28/2    Limitations HTN, left inattention, apraxia    Patient Stated Goals to be able to use my hand    Currently in Pain? No/denies    Pain Score 0-No pain          Patient seen for last aquatic therapy session.  Wife present, but patient not yet at a point where  he and she can carryover specific HEP.  Discussed with both, and plan to continue aquatic therapy in new year, and as patient is more independent/safe without facilitaiton - will train wife to assist with HEP.   Patient entered and exited the pool via the stairs and one railing with supervision.  Worked on walking in water at 4.0 feet to increase buoyancy, and also to increase resistance of water for greater % of body.  Continue to address/facilitate more upright control during stepping.  Patient with strong tendency to laterally flex trunk on left to help with left leg swing.  Patient with consistently improving step length, step width, and reduced tension in LUE during walking.  Worked - seated on bench to address active shoulder abd/add with elbow extension and upper trunk rotation.  Patient able to actively achieve full extension, if guided through initial 50%.  Worked on active stretch of LUE on railing on wall.  Using feet for leverage on wall. Worked to move body away from fixed arm to elongate elbow, and sightly flex upper trunk.                         OT Short Term Goals - 05/01/20 1819      OT SHORT TERM GOAL #1   Title I with inital HEP  Time 6    Period Weeks    Status Achieved      OT SHORT TERM GOAL #2   Title Pt will donn shirt with supervision and min v.c, and pants with min A    Time 6    Period Weeks    Status Achieved      OT SHORT TERM GOAL #3   Title Pt will attend to items in L hemibody space with no more than min v.c    Time 6    Period Weeks    Status Achieved      OT SHORT TERM GOAL #4   Title Pt will use LUE as stabilizer 10%of the time with min v.c    Time 6    Period Weeks    Status Achieved      OT SHORT TERM GOAL #5   Title Pt will transfer to toilet with close supervison    Time 6    Period Weeks    Status Achieved             OT Long Term Goals - 05/01/20 1819      OT LONG TERM GOAL #1   Title I with aquatic HEP     Time 12    Period Weeks    Status Partially Met      OT LONG TERM GOAL #2   Title Pt will donn shirt mod I and perfrom LB dressing with min A    Time 12    Period Weeks    Status Achieved      OT LONG TERM GOAL #3   Title Pt. will bathe with min A    Time 12    Period Weeks    Status Achieved      OT LONG TERM GOAL #4   Title Pt will demonstrate 40* A/ROM shoulder flexion in prep for reach    Time 12    Period Weeks    Status Achieved      OT LONG TERM GOAL #5   Title Pt will perform tabletop scanning activities with 75%better accuracy    Time 12    Period Weeks    Status Achieved      OT LONG TERM GOAL #6   Title Pt will demonstrate 50% elbow flexion/ extension in LUE  for functional use.    Time 12    Period Weeks    Status Partially Met                 Plan - 05/01/20 1816    Clinical Impression Statement Patient has no further therapy benefit for the year, and plans to restart therapy including aquatic therapy in 2022.    OT Occupational Profile and History Detailed Assessment- Review of Records and additional review of physical, cognitive, psychosocial history related to current functional performance    Occupational performance deficits (Please refer to evaluation for details): ADL's;IADL's;Leisure;Social Participation;Work;Play    Body Structure / Function / Physical Skills ADL;Balance;Endurance;Mobility;Strength;UE functional use;FMC;Vision;Coordination;Gait;Decreased knowledge of precautions;GMC;ROM;Decreased knowledge of use of DME;Dexterity;IADL;Sensation    Cognitive Skills Attention;Memory;Problem Solve;Safety Awareness;Thought;Understand    Rehab Potential Fair    Clinical Decision Making Limited treatment options, no task modification necessary    Comorbidities Affecting Occupational Performance: May have comorbidities impacting occupational performance    Modification or Assistance to Complete Evaluation  No modification of tasks or assist necessary  to complete eval    OT Frequency 2x / week    OT Treatment/Interventions Self-care/ADL  training;Therapeutic exercise;Functional Mobility Training;Balance training;Splinting;Manual Therapy;Neuromuscular education;Aquatic Therapy;Ultrasound;Energy conservation;Therapeutic activities;Cryotherapy;Paraffin;DME and/or AE instruction;Cognitive remediation/compensation;Visual/perceptual remediation/compensation;Gait Training;Fluidtherapy;Electrical Stimulation;Moist Heat;Contrast Bath;Passive range of motion;Patient/family education    Plan discharge OT - consider restart 2022    Consulted and Agree with Plan of Care Patient;Family member/caregiver    Family Member Consulted wife April           Patient will benefit from skilled therapeutic intervention in order to improve the following deficits and impairments:   Body Structure / Function / Physical Skills: ADL, Balance, Endurance, Mobility, Strength, UE functional use, FMC, Vision, Coordination, Gait, Decreased knowledge of precautions, GMC, ROM, Decreased knowledge of use of DME, Dexterity, IADL, Sensation Cognitive Skills: Attention, Memory, Problem Solve, Safety Awareness, Thought, Understand     Visit Diagnosis: Hemiplegia and hemiparesis following cerebral infarction affecting left non-dominant side (HCC)  Muscle weakness (generalized)  Unsteadiness on feet  Visuospatial deficit  Other symptoms and signs involving the nervous system  Other disturbances of skin sensation    Problem List Patient Active Problem List   Diagnosis Date Noted  . History of CVA (cerebrovascular accident) 01/19/2020  . Normocytic anemia   . Benign essential HTN   . Hyponatremia   . Acute blood loss anemia   . Hypotension due to drugs   . Vascular headache   . Prerenal azotemia   . Sleep disturbance   . Leukocytosis   . Transaminitis   . Marijuana abuse   . Essential hypertension   . Dysphagia, post-stroke   . Left hemiparesis ([place])   . Acute  respiratory failure ([place])   . ICH (intracerebral hemorrhage) ([place]) 12/06/2019    Mariah Milling, OTR/L 05/01/2020, 6:21 PM  [facility] [address] [place], [place], [address] Phone: [phone]   Fax:  [phone]  Name: Briant Angelillo MRN: [id number] Date of Birth: 03-17-1981

## 2020-05-10 ENCOUNTER — Telehealth: Payer: Self-pay

## 2020-05-10 NOTE — Telephone Encounter (Signed)
Need prescription for left leg brace size large to go to [facility].

## 2020-05-15 NOTE — Telephone Encounter (Signed)
WILL ADDRESS AT [facility] CLINIC VISIT

## 2020-05-17 NOTE — Telephone Encounter (Signed)
Patient notified

## 2020-05-18 ENCOUNTER — Other Ambulatory Visit: Payer: Self-pay

## 2020-05-18 ENCOUNTER — Encounter
Payer: BC Managed Care – PPO | Attending: Physical Medicine & Rehabilitation | Admitting: Physical Medicine & Rehabilitation

## 2020-05-18 ENCOUNTER — Encounter: Payer: Self-pay | Admitting: Physical Medicine & Rehabilitation

## 2020-05-18 VITALS — BP 126/86 | HR 85 | Temp 98.6°F | Ht 67.0 in | Wt 145.6 lb

## 2020-05-18 DIAGNOSIS — G8114 Spastic hemiplegia affecting left nondominant side: Secondary | ICD-10-CM

## 2020-05-18 NOTE — Patient Instructions (Signed)
Graduated return to driving instructions were provided. It is recommended that the patient first drives with another licensed driver in an empty parking lot. If the patient does well with this, and they can drive on a quiet street with the licensed driver. If the patient does well with this they can drive on a busy street with a licensed driver. If the patient does well with this, the next time out they can go by himself. For the first month after resuming driving, I recommend no nighttime or Interstate driving.   Please let me know if you need a work note, we can request part time for the first 1-2 weeks  It should be safe for you to get a Covid vaccine

## 2020-05-18 NOTE — Progress Notes (Signed)
Subjective:    Patient ID: Dennis Mercado, male    DOB: 1981-03-08, 39 y.o.   MRN: [id number]  HPI 39 year old male with history of right thalamic hemorrhage on 12/06/2019 due to hypertension returns today with primary complaints of left arm weakness.  The patient has had no falls or trauma to the area.  He has had continued outpatient therapy.  His current therapies are not hold until the new year.  The patient has some billing issue with therapy as well which he went into great detail and was aware of several complex factors. The patient has goals of returning to work as well as returning to driving.  He is accompanied by his wife  04/06/2020 Left  Biceps 300   FPL 100 FCR 200 FDS 200 FDP 200  Pain Inventory Average Pain 0 Pain Right Now 0 My pain is no pain  LOCATION OF PAIN  No pain  BOWEL Number of stools per week: 7 Oral laxative use No  Type of laxative No Enema or suppository use No  History of colostomy No  Incontinent No   BLADDER Normal In and out cath, frequency No Able to self cath Yes  Bladder incontinence Yes  Frequent urination No  Leakage with coughing No  Difficulty starting stream Yes  Incomplete bladder emptying Yes    Mobility use a cane how many minutes can you walk? 10 mins ability to climb steps?  yes do you drive?  no Do you have any goals in this area?  yes  Function not employed: date last employed 12/06/2019 on Medical Leave  Neuro/Psych weakness trouble walking  Prior Studies Any changes since last visit?  no  Physicians involved in your care Any changes since last visit?  no   History reviewed. No pertinent family history. Social History   Socioeconomic History   Marital status: Married    Spouse name: Not on file   Number of children: Not on file   Years of education: Not on file   Highest education level: Not on file  Occupational History   Not on file  Tobacco Use   Smoking status: Never Smoker    Smokeless tobacco: Never Used  Vaping Use   Vaping Use: Never used  Substance and Sexual Activity   Alcohol use: Not Currently    Comment: several drinks over the weekend   Drug use: Never   Sexual activity: Not on file  Other Topics Concern   Not on file  Social History Narrative   Not on file   Social Determinants of Health   Financial Resource Strain:    Difficulty of Paying Living Expenses: Not on file  Food Insecurity:    Worried About [place] in the Last Year: Not on file   Ran Out of Food in the Last Year: Not on file  Transportation Needs:    Lack of Transportation (Medical): Not on file   Lack of Transportation (Non-Medical): Not on file  Physical Activity:    Days of Exercise per Week: Not on file   Minutes of Exercise per Session: Not on file  Stress:    Feeling of Stress : Not on file  Social Connections:    Frequency of Communication with Friends and Family: Not on file   Frequency of Social Gatherings with Friends and Family: Not on file   Attends Religious Services: Not on file   Active Member of Clubs or Organizations: Not on file   Attends Club or  Organization Meetings: Not on file   Marital Status: Not on file   History reviewed. No pertinent surgical history. History reviewed. No pertinent past medical history. BP 126/86    Pulse 85    Temp 98.6 F (37 C)    Ht 5\' 7"  (1.702 m)    Wt 145 lb 9.6 oz (66 kg)    SpO2 96%    BMI 22.80 kg/m   Opioid Risk Score:   Fall Risk Score:  `1  Depression screen PHQ 2/9  Depression screen [facility] 2/9 04/18/2020 01/25/2020  Decreased Interest 0 0  Down, Depressed, Hopeless 0 0  PHQ - 2 Score 0 0  Altered sleeping - 0  Tired, decreased energy - 0  Change in appetite - 0  Feeling bad or failure about yourself  - 0  Trouble concentrating - 0  Moving slowly or fidgety/restless - 0  Suicidal thoughts - 0  PHQ-9 Score - 0   Review of Systems  Musculoskeletal: Positive for gait problem.   Neurological: Positive for weakness and numbness.  All other systems reviewed and are negative.      Objective:   Physical Exam Vitals and nursing note reviewed.  Constitutional:      Appearance: He is normal weight.  HENT:     Head: Normocephalic and atraumatic.  Eyes:     Extraocular Movements: Extraocular movements intact.     Conjunctiva/sclera: Conjunctivae normal.     Pupils: Pupils are equal, round, and reactive to light.  Skin:    General: Skin is warm and dry.  Neurological:     Mental Status: He is alert and oriented to person, place, and time.     Motor: Weakness and atrophy present.     Coordination: Coordination abnormal.     Gait: Gait abnormal.     Comments: Motor strength is 3 - at the left deltoid bicep finger flexors to minus finger extensors and wrist extensors 3/5 left hip flexor, 4/5 knee extensor to minus at the left ankle dorsiflexor and plantar flexors Tone has clonus at the left ankle Left upper extremity tone MAS 3 at the left elbow flexors MAS 3 at the wrist flexors MAS 2 at the finger flexors MAS 0 at the thumb flexor.  Ambulates with left AFO as well as walker no evidence of toe drag or knee instability  Psychiatric:        Mood and Affect: Mood normal.        Behavior: Behavior normal.        Thought Content: Thought content normal.        Judgment: Judgment normal.           Assessment & Plan:  #1.  Left hemiparesis secondary to right thalamic hemorrhage.  He has spasticity which did improve to some degree with his botulinum toxin injection.  Would recommend higher dose in 6 weeks.  Left  Biceps 500   FPL 100 FCR 300 Palmaris long 100 FDS 200 FDP 300  We discussed driving and that fortunately his weakness is on the left side meaning that most of the controls are accessible to him.  Cognitively he appears to be doing well No visual concerns or perceptual concerns  Graduated return to driving instructions were provided. It is  recommended that the patient first drives with another licensed driver in an empty parking lot. If the patient does well with this, and they can drive on a quiet street with the licensed driver. If the patient does  well with this they can drive on a busy street with a licensed driver. If the patient does well with this, the next time out they can go by himself. For the first month after resuming driving, I recommend no nighttime or Interstate driving.   We also discussed return to work, he worked at an [employer] but he did not do the physical labor.  He has been practicing typing with one hand on his computer and states that this is going well.  He may be able to go back to work in the next 2-3 months at least on a part-time basis

## 2020-05-19 DIAGNOSIS — M21372 Foot drop, left foot: Secondary | ICD-10-CM | POA: Diagnosis not present

## 2020-05-29 ENCOUNTER — Telehealth: Payer: Self-pay

## 2020-05-29 NOTE — Telephone Encounter (Signed)
Attempted to call pt, LVM for call back, need to switch appt to [facility]

## 2020-05-30 ENCOUNTER — Telehealth (INDEPENDENT_AMBULATORY_CARE_PROVIDER_SITE_OTHER): Payer: BC Managed Care – PPO | Admitting: Adult Health

## 2020-05-30 ENCOUNTER — Encounter: Payer: Self-pay | Admitting: Adult Health

## 2020-05-30 DIAGNOSIS — E785 Hyperlipidemia, unspecified: Secondary | ICD-10-CM | POA: Diagnosis not present

## 2020-05-30 DIAGNOSIS — I69398 Other sequelae of cerebral infarction: Secondary | ICD-10-CM | POA: Diagnosis not present

## 2020-05-30 DIAGNOSIS — I619 Nontraumatic intracerebral hemorrhage, unspecified: Secondary | ICD-10-CM | POA: Diagnosis not present

## 2020-05-30 DIAGNOSIS — I10 Essential (primary) hypertension: Secondary | ICD-10-CM

## 2020-05-30 DIAGNOSIS — R569 Unspecified convulsions: Secondary | ICD-10-CM

## 2020-05-30 NOTE — Progress Notes (Signed)
[facility] [address] [place]. [place] [address] [phone]       STROKE FOLLOW UP NOTE  [name] Date of Birth:  1981-03-15 Medical Record Number:  [id number]   Reason for Referral:  stroke follow up  Virtual Visit via Video Note  I connected with Dennis Mercado on 05/30/20 at  7:45 AM EST by a video enabled telemedicine application located in office and verified that I am speaking with the correct person using two identifiers who was located at their own home.   Dennis Mercado, CMA schedule visit to discussed the limitations of evaluation and management by telemedicine and the availability of in person appointments. The patient expressed understanding and agreed to proceed.   SUBJECTIVE:   HPI:   Today, 05/30/2020, [name] is being seen via MyChart virtual visit for stroke follow-up.  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  Currently receiving Botox injections by Dr. Letta Pate with some improvement and plans on receiving higher dosage around mid January. He has been ambulating with cane and short distance without cane without any recent falls. Return back to driving on graduated return to driving basis per Dr. Letta Pate and has not had any difficulty.  Denies ongoing cognitive difficulties.  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  He reports discussing this further with his current employer who agreed to work with him with his restriction.  Denies new or worsening stroke/TIA symptoms.  Reports ongoing compliance with atorvastatin 40 mg daily.  Blood pressure stable at home per patient typically 120s/70s with ongoing use of lisinopril, carvedilol and amlodipine.  Remains on Keppra 500 mg twice daily without side effects and denies any seizure activity.  No further concerns at this time.    History provided for reference purposes only Initial visit  02/08/2020 JM: Dennis Mercado is being seen for hospital follow-up accompanied by his wife. He was discharged home from CIR on 01/12/2020 after a 28-day stay. He has been making excellent progress with residual hemiparesis, cognitive impairment, and left facial droop Denies residual dysphagia or visual impairment Currently working with neuro rehab PT and speech with ongoing improvement Able to ambulate with RW short distance; w/c long distance Denies new or worsening stroke/TIA symptoms No recurrent seizure activity and remains on Keppra 500 mg twice daily tolerating well without side effects Continues on atorvastatin 40 mg daily without myalgias Blood pressure today 118/72 and remains on lisinopril, clonidine, carvedilol and amlodipine Monitors at home which has been stable Reports complete cessation of THC, tobacco and EtOH use No concerns  Stroke admission 12/06/2019 Dennis Mercado is a 39 y.o. male with recent HTN at dentist office with plans for PCP visit next week, who presented on 12/06/2019 post coitus with L sided weakness, and elevated BP. 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 Hypertensive emergency upon arrival with BP 119/162 treated with Cleviprex and placed on Coreg, amlodipine, hydralazine, clonidine and lisinopril. Seizure activity following extubation on 6/25 discharged on Keppra with EEG showing severe diffuse encephalopathy (likely post sedation). LDL 157 and initiate atorvastatin 40 mg daily. Other stroke risk factors include EtOH use and THC use but no prior stroke history. Residual deficits of dysphagia, right gaze preference, left hemianopia, left facial droop, and left hemiparesis. 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 IVH. Local mass effect w/o midline shift  CTA head Unremarkable   CT head at 12h  stable  CT head 6/22 0440 stable R thalamic hemorrhage, IVH. Sllight increased in ventricle size. 4-5 midline shift  CT head 6/22 remains stable     MRI 6/23 - Unchanged size of right thalamic ICH with mild surrounding edema. No hydrocephalus or ventricular entrapment.   CT Head 6/26 - No substantial change  EEG - severe diffuse encephalopathy  Carotid Doppler - unremarkable  2D Echo EF 60-65%. No source of embolus    LDL 157  HgbA1c 5.6   Heparin subq for VTE prophylaxis  No antithrombotic prior to admission, now on No antithrombotic given hemorrhage   Therapy recommendations: CIR   Disposition:  CIR  Seizure   Agitation following extubation 6/25  L trapezius muscle contractions, extending to R trapezius, with subsequent Upper and lower extremity rigidity with rhythmic contraction. Pt unresponsive, upward deviated gaze. C/w seizure   S/p ativan  On depakote 500 Q6h -> 750 Q8h -> off  Now on keppra  EEG severe diffuse encephalopathy  Depakote level - 39        ROS:   14 system review of systems performed and negative with exception of weakness  PMH: No past medical history on file.  PSH: No past surgical history on file.  Social History:  Social History   Socioeconomic History  . Marital status: Married    Spouse name: Not on file  . Number of children: Not on file  . Years of education: Not on file  . Highest education level: Not on file  Occupational History  . Not on file  Tobacco Use  . Smoking status: Never Smoker  . Smokeless tobacco: Never Used  Vaping Use  . Vaping Use: Never used  Substance and Sexual Activity  . Alcohol use: Not Currently    Comment: several drinks over the weekend  . Drug use: Never  . Sexual activity: Not on file  Other Topics Concern  . Not on file  Social History Narrative  . Not on file   Social Determinants of Health   Financial Resource Strain: Not on file  Food Insecurity: Not on file   Transportation Needs: Not on file  Physical Activity: Not on file  Stress: Not on file  Social Connections: Not on file  Intimate Partner Violence: Not on file    Family History: No family history on file.  Medications:   Current Outpatient Medications on File Prior to Visit  Medication Sig Dispense Refill  . acetaminophen (TYLENOL) 325 MG tablet Take 2 tablets (650 mg total) by mouth every 4 (four) hours as needed for mild pain (or temp > 37.5 C (99.5 F)).    Marland Kitchen amLODipine (NORVASC) 10 MG tablet TAKE 1 TABLET(10 MG) BY MOUTH DAILY 90 tablet 0  . atorvastatin (LIPITOR) 40 MG tablet TAKE 1 TABLET(40 MG) BY MOUTH DAILY 90 tablet 0  . carvedilol (COREG) 25 MG tablet TAKE 1 TABLET(25 MG) BY MOUTH TWICE DAILY WITH A MEAL 90 tablet 0  . levETIRAcetam (KEPPRA) 500 MG tablet Take 1 tablet (500 mg total) by mouth 2 (two) times daily. 180 tablet 3  . lisinopril (ZESTRIL) 5 MG tablet TAKE 1 TABLET(5 MG) BY MOUTH DAILY 90 tablet 0  . omeprazole (PRILOSEC OTC) 20 MG tablet Take 20 mg by mouth daily.      No current facility-administered medications on file prior to visit.  Allergies:   Allergies  Allergen Reactions  . Percocet [Oxycodone-Acetaminophen] Itching    Pt says he has previously tolerated.       OBJECTIVE: *Limited due to visit type*  Physical Exam  General: well developed, well nourished, pleasant middle-aged African-American male, seated, in no evident distress  Neurologic Exam Mental Status: Awake and fully alert. Oriented to place and time. Recent and remote memory intact. Attention span, concentration and fund of knowledge appropriate. Mood and affect appropriate.       ASSESSMENT: Dennis Mercado is a 39 y.o. year old male presented with left-sided weakness and hypertensive emergency on 12/06/2019 with stroke work-up revealing right thalamic ICH with IVH and midline shift secondary to hypertension. Recently diagnosed HTN at dentist office but had not had PCP  follow-up prior to event. Also has seizure activity during admission placed on Keppra. Vascular risk factors include newly diagnosed HTN, HLD, poststroke seizures, EtOH use and substance abuse.      PLAN:  1. Hypertensive right thalamic ICH:  a. Residual deficit: Left hemiparesis with spasticity i. 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  Discussed importance of avoiding excessive exertion or performing specific tasks that could pose safety concerns - patient verbalized understanding and is aware of his deficits and limitations ii. Discussed gradual return to driving instructions as previously discussed with Dr. Letta Pate iii. Continue to follow with Dr. Letta Pate for Botox for poststroke spasticity b. Continue atorvastatin 40 mg daily for secondary stroke prevention.  c. No indication for antithrombotic as no prior stroke history or cardiovascular risk factors d. Discussed secondary stroke prevention measures and importance of close PCP follow up for aggressive stroke risk factor management  2. Seizures, poststroke: a. Stable on Keppra 500 mg twice daily 3. HTN:  a. BP goal <130/90.  Stable per patient on lisinopril, carvedilol and amlodipine per PCP 4. HLD:  a. LDL goal <70. Recent LDL 157.  On atorvastatin 40 mg daily per PCP    Follow up in 4 months or call earlier if needed   CC:  [facility] provider: Dr. Cira Rue, Mortimer Fries, MD     I spent 30 minutes of non-face-to-face time via MyChart virtual visit with patient.  This included previsit chart review, lab review, study review, order entry, electronic health record documentation, patient education regarding stroke and residual deficits, return to work on light duty, importance of  managing stroke risk factors and answered all other questions to patient satisfaction   Frann Rider, [facility]  [facility] [address] [place], [place] [address]  Phone [phone] Fax [phone] Note: This document was prepared with digital dictation and possible smart phrase technology. Any transcriptional errors that result from this process are unintentional.

## 2020-06-06 NOTE — Progress Notes (Signed)
I agree with the above plan 

## 2020-06-21 ENCOUNTER — Encounter: Payer: Self-pay | Admitting: Physician Assistant

## 2020-06-21 ENCOUNTER — Telehealth (INDEPENDENT_AMBULATORY_CARE_PROVIDER_SITE_OTHER): Payer: BC Managed Care – PPO | Admitting: Physician Assistant

## 2020-06-21 VITALS — BP 130/83 | Temp 97.6°F | Ht 67.0 in | Wt 144.0 lb

## 2020-06-21 DIAGNOSIS — J069 Acute upper respiratory infection, unspecified: Secondary | ICD-10-CM

## 2020-06-21 NOTE — Progress Notes (Signed)
Virtual Visit via Video   I connected with Dennis Mercado on 06/21/20 at 11:30 AM EST by a video enabled telemedicine application and verified that I am speaking with the correct person using two identifiers. Location patient: Home Location provider: [facility], Office Persons participating in the virtual visit: [place], Inda Coke PA-C, Anselmo Pickler, LPN   I discussed the limitations of evaluation and management by telemedicine and the availability of in person appointments. The patient expressed understanding and agreed to proceed.  I acted as a Education administrator for [employer], PA-C [employer], LPN    Subjective:   HPI:   Patient is requesting evaluation for possible COVID-19.  Symptom onset: 2 days ago  Travel/contacts: No exposure that he is aware of.  Vaccination status: None  Patient endorses the following symptoms: Fever (103 yesterday, took tylenol came down to 97.6 today.), sinus congestion, rhinorrhea and myalgia  Patient denies the following symptoms: sinus headache, ear pain, sore throat, chest tightness and chest pain, SOB, wheezing  No prior history of pulmonary issues  Reports that his symptoms are improving significantly with time.  Treatments tried: Tylenol   Patient risk factors: Current [id number] risk of complications score: 3 Smoking status: Dennis Mercado  reports that he has never smoked. He has never used smokeless tobacco. If male, currently pregnant? []   Yes []   No  ROS: See pertinent positives and negatives per HPI.  Patient Active Problem List   Diagnosis Date Noted  . History of CVA (cerebrovascular accident) 01/19/2020  . Normocytic anemia   . Benign essential HTN   . Hyponatremia   . Acute blood loss anemia   . Hypotension due to drugs   . Vascular headache   . Prerenal azotemia   . Sleep disturbance   . Leukocytosis   . Transaminitis   . Marijuana abuse   . Essential hypertension   . Dysphagia, post-stroke    . Left hemiparesis ([place])   . Acute respiratory failure ([place])   . ICH (intracerebral hemorrhage) ([place]) 12/06/2019    Social History   Tobacco Use  . Smoking status: Never Smoker  . Smokeless tobacco: Never Used  Substance Use Topics  . Alcohol use: Not Currently    Comment: several drinks over the weekend    Current Outpatient Medications:  .  acetaminophen (TYLENOL) 325 MG tablet, Take 2 tablets (650 mg total) by mouth every 4 (four) hours as needed for mild pain (or temp > 37.5 C (99.5 F))., Disp: , Rfl:  .  amLODipine (NORVASC) 10 MG tablet, TAKE 1 TABLET(10 MG) BY MOUTH DAILY, Disp: 90 tablet, Rfl: 0 .  atorvastatin (LIPITOR) 40 MG tablet, TAKE 1 TABLET(40 MG) BY MOUTH DAILY, Disp: 90 tablet, Rfl: 0 .  carvedilol (COREG) 25 MG tablet, TAKE 1 TABLET(25 MG) BY MOUTH TWICE DAILY WITH A MEAL, Disp: 90 tablet, Rfl: 0 .  levETIRAcetam (KEPPRA) 500 MG tablet, Take 1 tablet (500 mg total) by mouth 2 (two) times daily., Disp: 180 tablet, Rfl: 3 .  lisinopril (ZESTRIL) 5 MG tablet, TAKE 1 TABLET(5 MG) BY MOUTH DAILY, Disp: 90 tablet, Rfl: 0 .  omeprazole (PRILOSEC OTC) 20 MG tablet, Take 20 mg by mouth daily. , Disp: , Rfl:   Allergies  Allergen Reactions  . Percocet [Oxycodone-Acetaminophen] Itching    Pt says he has previously tolerated.     Objective:   VITALS: Per patient if applicable, see vitals. GENERAL: Alert, appears well and in no acute distress. HEENT: Atraumatic, conjunctiva clear,  no obvious abnormalities on inspection of external nose and ears. NECK: Normal movements of the head and neck. CARDIOPULMONARY: No increased WOB. Speaking in clear sentences. I:E ratio WNL.  MS: Moves all visible extremities without noticeable abnormality. PSYCH: Pleasant and cooperative, well-groomed. Speech normal rate and rhythm. Affect is appropriate. Insight and judgement are appropriate. Attention is focused, linear, and appropriate.  NEURO: CN grossly intact. Oriented as arrived to  appointment on time with no prompting. Moves both UE equally.  SKIN: No obvious lesions, wounds, erythema, or cyanosis noted on face or hands.  Assessment and Plan:   Corrie was seen today for covid symptoms.  Diagnoses and all orders for this visit:  Viral URI    No red flags on discussion, patient is not in any obvious distress during our visit. Discussed progression of most viral illness, and recommended supportive care at this point in time.  Continue 500 mg tylenol tid if needed for fever.  Discussed over the counter supportive care options, with recommendations to push fluids and rest. Reviewed return precautions including new/worsening fever, SOB, new/worsening cough or other concerns.  Recommended need to self-quarantine and practice social distancing until symptoms resolve.  Discussed current recommendations for COVID/flu testing. He has declined flu and COVID testing.   I recommend that patient follow-up if symptoms worsen or persist despite treatment x 7-10 days, sooner if needed.   I discussed the assessment and treatment plan with the patient. The patient was provided an opportunity to ask questions and all were answered. The patient agreed with the plan and demonstrated an understanding of the instructions.   The patient was advised to call back or seek an in-person evaluation if the symptoms worsen or if the condition fails to improve as anticipated.   CMA or LPN served as scribe during this visit. History, Physical, and Plan performed by medical provider. The above documentation has been reviewed and is accurate and complete.   [place], [place] 06/21/2020

## 2020-06-24 DIAGNOSIS — U071 COVID-19: Secondary | ICD-10-CM | POA: Diagnosis not present

## 2020-06-24 DIAGNOSIS — Z20822 Contact with and (suspected) exposure to covid-19: Secondary | ICD-10-CM | POA: Diagnosis not present

## 2020-06-25 ENCOUNTER — Other Ambulatory Visit: Payer: Self-pay | Admitting: Family

## 2020-07-07 ENCOUNTER — Encounter: Payer: Self-pay | Admitting: Physical Medicine & Rehabilitation

## 2020-07-07 ENCOUNTER — Other Ambulatory Visit: Payer: Self-pay

## 2020-07-07 ENCOUNTER — Encounter
Payer: BC Managed Care – PPO | Attending: Physical Medicine & Rehabilitation | Admitting: Physical Medicine & Rehabilitation

## 2020-07-07 VITALS — BP 124/85 | HR 77 | Temp 98.5°F | Ht 67.0 in | Wt 147.0 lb

## 2020-07-07 DIAGNOSIS — G8114 Spastic hemiplegia affecting left nondominant side: Secondary | ICD-10-CM | POA: Insufficient documentation

## 2020-07-07 NOTE — Progress Notes (Signed)
Dysport Injection for spasticity using needle EMG guidance  Dilution: 200 Units/ml Indication: Severe spasticity which interferes with ADL,mobility and/or  hygiene and is unresponsive to medication management and other conservative care Informed consent was obtained after describing risks and benefits of the procedure with the patient. This includes bleeding, bruising, infection, excessive weakness, or medication side effects. A REMS form is on file and signed. Needle:  needle electrode Number of units per muscle Left  Biceps 500   FPL 100 FCR 300 Palmaris long 100 FDS 200 FDP 300 All injections were done after obtaining appropriate EMG activity and after negative drawback for blood. The patient tolerated the procedure well. Post procedure instructions were given. A followup appointment was made.

## 2020-07-07 NOTE — Patient Instructions (Signed)
You received a Dysport injection today. You may experience muscle pains and aches. He may apply ice 20 minutes every 2 hours as needed for the next 24-48 hours. He also noticed bleeding or bruising in the areas that were injected. May apply Band-Aid. If this bruising is extensive, please notify our office. If there is evidence of increasing redness that occurs 2-3 days after injection. Please call our office. This could be a sign of infection. It is very rare, however. You may experience some muscle weakness in the muscles and injected. This would likely start in about one week.  Left  Biceps 500   FPL 100 FCR 300 Palmaris long 100 FDS 200 FDP 300

## 2020-07-10 ENCOUNTER — Ambulatory Visit: Payer: BC Managed Care – PPO | Attending: Physician Assistant | Admitting: Occupational Therapy

## 2020-07-10 ENCOUNTER — Encounter: Payer: Self-pay | Admitting: Occupational Therapy

## 2020-07-10 DIAGNOSIS — R414 Neurologic neglect syndrome: Secondary | ICD-10-CM | POA: Diagnosis not present

## 2020-07-10 DIAGNOSIS — I69354 Hemiplegia and hemiparesis following cerebral infarction affecting left non-dominant side: Secondary | ICD-10-CM | POA: Insufficient documentation

## 2020-07-10 DIAGNOSIS — R29818 Other symptoms and signs involving the nervous system: Secondary | ICD-10-CM | POA: Diagnosis not present

## 2020-07-10 DIAGNOSIS — R41842 Visuospatial deficit: Secondary | ICD-10-CM | POA: Insufficient documentation

## 2020-07-10 DIAGNOSIS — R208 Other disturbances of skin sensation: Secondary | ICD-10-CM | POA: Insufficient documentation

## 2020-07-10 DIAGNOSIS — R2681 Unsteadiness on feet: Secondary | ICD-10-CM | POA: Diagnosis not present

## 2020-07-10 DIAGNOSIS — M6281 Muscle weakness (generalized): Secondary | ICD-10-CM | POA: Diagnosis not present

## 2020-07-10 NOTE — Therapy (Addendum)
[facility] [address] [place], [place], [address] Phone: [phone]   Fax:  [phone]  Occupational Therapy Reassessment and Treatment  Patient Details  Name: Dennis Mercado MRN: [id number] Date of Birth: Feb 08, 1981 Referring Provider (OT): Dr. Wynn Banker   Encounter Date: 07/10/2020   OT End of Session - 07/10/20 1903    Visit Number 1    Number of Visits 16    Date for OT Re-Evaluation 09/23/20    Authorization Type need to be determined    Authorization Time Period 60 visit limit    OT Start Time 1330    OT Stop Time 1415    OT Time Calculation (min) 45 min    Equipment Utilized During Treatment floatation devices    Activity Tolerance Patient tolerated treatment well    Behavior During Therapy [facility] for tasks assessed/performed           History reviewed. No pertinent past medical history.  History reviewed. No pertinent surgical history.  There were no vitals filed for this visit.   Subjective Assessment - 07/10/20 1854    Subjective  Patient has returned for OT to continue to OT for clinic and aquatic therapy.    Patient is accompanied by: Family member    Pertinent History Pt. presented to ED on 12/06/2019 with left side weakness and  altered mental status s/p coitus.  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 Pt required intubation 6/22 due to ARF secondary to aspiration PNA. Received inpatient rehab. Pt was discharged 7/28/2    Limitations HTN, left inattention, apraxia    Patient Stated Goals to be able to use my hand    Currently in Pain? No/denies    Pain Score 0-No pain            Patient seen for brief OT reassessment as plan was for him to return for aquatic therapy in 2022.  Patient seen in 3.5-4 feet of water to address tension throughout left UE/LE.  Warm up with water walking with emphasis on  realignment of trunk, then supine with floatation equipment to address capsular tightness shoulder girdle, forearm and wrist range of motion.                        OT Short Term Goals - 07/10/20 1909      OT SHORT TERM GOAL #1   Title I with inital HEP    Time 4    Period Weeks    Status New      OT SHORT TERM GOAL #2   Title Patient will demonstrate low reach to 30 degrees of shoulder flexion with min assist    Time 4    Period Weeks    Status New      OT SHORT TERM GOAL #3   Title Patient will demonstrate 30 degrees of active assited wrist and forearm motion    Time 4    Period Weeks    Status New      OT SHORT TERM GOAL #4   Title xxx      OT SHORT TERM GOAL #5   Title xxx             OT Long Term Goals - 07/10/20 1915      OT LONG TERM GOAL #1   Title I with aquatic HEP    Time 8  Period Weeks    Status New    Target Date 09/23/20      OT LONG TERM GOAL #2   Title Patient will demonstrate low reach to 45 degrees of shoulder flexion with elbow extension    Time 8    Period Weeks    Status New      OT LONG TERM GOAL #3   Title Patient will demonstrate no pain with passive range of motion to 120 shoulder flexion/abduction    Time 8    Period Weeks    Status New      OT LONG TERM GOAL #4   Title xxx      OT LONG TERM GOAL #5   Title xxx      OT LONG TERM GOAL #6   Title xxx                 Plan - 07/10/20 1908    Clinical Impression Statement Patient continues to receive Botox injections, and has new benefit for therapy.  Will continue therapy using both aqautic and clinic therapy.    OT Occupational Profile and History Detailed Assessment- Review of Records and additional review of physical, cognitive, psychosocial history related to current functional performance    Occupational performance deficits (Please refer to evaluation for details): ADL's;IADL's;Leisure;Social Participation;Work;Play    Body Structure /  Function / Physical Skills ADL;Balance;Endurance;Mobility;Strength;UE functional use;FMC;Vision;Coordination;Gait;Decreased knowledge of precautions;GMC;ROM;Decreased knowledge of use of DME;Dexterity;IADL;Sensation;Tone;Body Banker;Safety Awareness;Thought;Understand    Rehab Potential Good    Clinical Decision Making Limited treatment options, no task modification necessary    Comorbidities Affecting Occupational Performance: May have comorbidities impacting occupational performance    Modification or Assistance to Complete Evaluation  No modification of tasks or assist necessary to complete eval    OT Frequency 2x / week    OT Duration 8 weeks    OT Treatment/Interventions Self-care/ADL training;Therapeutic exercise;Functional Mobility Training;Balance training;Splinting;Manual Therapy;Neuromuscular education;Aquatic Therapy;Ultrasound;Energy conservation;Therapeutic activities;Cryotherapy;Paraffin;DME and/or AE instruction;Cognitive remediation/compensation;Visual/perceptual remediation/compensation;Gait Training;Fluidtherapy;Electrical Stimulation;Moist Heat;Contrast Bath;Passive range of motion;Patient/family education    Plan aquatic therapy and clinic therapy to address spasticity    Consulted and Agree with Plan of Care Patient;Family member/caregiver    Family Member Consulted wife April           Patient will benefit from skilled therapeutic intervention in order to improve the following deficits and impairments:   Body Structure / Function / Physical Skills: ADL,Balance,Endurance,Mobility,Strength,UE functional use,FMC,Vision,Coordination,Gait,Decreased knowledge of precautions,GMC,ROM,Decreased knowledge of use of DME,Dexterity,IADL,Sensation,Tone,Body mechanics Cognitive Skills: Attention,Memory,Problem Solve,Safety Awareness,Thought,Understand     Visit Diagnosis: Hemiplegia and hemiparesis following cerebral infarction  affecting left non-dominant side ([place]) - Plan: Ot plan of care cert/re-cert  Muscle weakness (generalized) - Plan: Ot plan of care cert/re-cert  Unsteadiness on feet - Plan: Ot plan of care cert/re-cert  Visuospatial deficit - Plan: Ot plan of care cert/re-cert  Other symptoms and signs involving the nervous system - Plan: Ot plan of care cert/re-cert  Other disturbances of skin sensation - Plan: Ot plan of care cert/re-cert  Neurologic neglect syndrome - Plan: Ot plan of care cert/re-cert    Problem List Patient Active Problem List   Diagnosis Date Noted  . History of CVA (cerebrovascular accident) 01/19/2020  . Normocytic anemia   . Benign essential HTN   . Hyponatremia   . Acute blood loss anemia   . Hypotension due to drugs   . Vascular headache   . Prerenal azotemia   . Sleep disturbance   .  Leukocytosis   . Transaminitis   . Marijuana abuse   . Essential hypertension   . Dysphagia, post-stroke   . Left hemiparesis ([place])   . Acute respiratory failure ([place])   . ICH (intracerebral hemorrhage) ([place]) 12/06/2019    Mariah Milling, OTR/L 07/10/2020, 7:21 PM  [facility] [address] [place], [place], [address] Phone: [phone]   Fax:  [phone]  Name: Dennis Mercado MRN: [id number] Date of Birth: 11-Apr-1981

## 2020-07-17 ENCOUNTER — Telehealth: Payer: Self-pay | Admitting: Physical Medicine & Rehabilitation

## 2020-07-17 ENCOUNTER — Ambulatory Visit: Payer: BC Managed Care – PPO | Admitting: Occupational Therapy

## 2020-07-17 ENCOUNTER — Encounter: Payer: Self-pay | Admitting: Occupational Therapy

## 2020-07-17 DIAGNOSIS — G8114 Spastic hemiplegia affecting left nondominant side: Secondary | ICD-10-CM

## 2020-07-17 DIAGNOSIS — R208 Other disturbances of skin sensation: Secondary | ICD-10-CM | POA: Diagnosis not present

## 2020-07-17 DIAGNOSIS — I69354 Hemiplegia and hemiparesis following cerebral infarction affecting left non-dominant side: Secondary | ICD-10-CM | POA: Diagnosis not present

## 2020-07-17 DIAGNOSIS — R29818 Other symptoms and signs involving the nervous system: Secondary | ICD-10-CM | POA: Diagnosis not present

## 2020-07-17 DIAGNOSIS — M6281 Muscle weakness (generalized): Secondary | ICD-10-CM | POA: Diagnosis not present

## 2020-07-17 DIAGNOSIS — R41842 Visuospatial deficit: Secondary | ICD-10-CM | POA: Diagnosis not present

## 2020-07-17 DIAGNOSIS — R414 Neurologic neglect syndrome: Secondary | ICD-10-CM | POA: Diagnosis not present

## 2020-07-17 DIAGNOSIS — R2681 Unsteadiness on feet: Secondary | ICD-10-CM | POA: Diagnosis not present

## 2020-07-17 NOTE — Therapy (Signed)
[facility] [address] [place], [place], [address] Phone: [phone]   Fax:  [phone]  Occupational Therapy Treatment  Patient Details  Name: Dennis Mercado MRN: [id number] Date of Birth: Mar 27, 1981 Referring Provider (OT): Dr. Letta Pate   Encounter Date: 07/17/2020   OT End of Session - 07/17/20 1701    Visit Number 2    Number of Visits 16    Date for OT Re-Evaluation 09/23/20    Authorization Type need to be determined    Authorization Time Period 60 visit limit    Authorization - Visit Number 2    Authorization - Number of Visits 16    OT Start Time 1610    OT Stop Time 1700    OT Time Calculation (min) 50 min    Equipment Utilized During Treatment floatation devices    Activity Tolerance Patient tolerated treatment well    Behavior During Therapy [facility] for tasks assessed/performed           History reviewed. No pertinent past medical history.  History reviewed. No pertinent surgical history.  There were no vitals filed for this visit.   Subjective Assessment - 07/17/20 1700    Subjective  Patient indicates his third and fourth finger do not flex as well since receiving Botox injection.    Patient is accompanied by: Family member    Pertinent History Pt. presented to ED on 12/06/2019 with left side weakness and  altered mental status s/p coitus.  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 Pt required intubation 6/22 due to ARF secondary to aspiration PNA. Received inpatient rehab. Pt was discharged 7/28/2    Limitations HTN, left inattention, apraxia    Patient Stated Goals to be able to use my hand    Currently in Pain? No/denies    Pain Score 0-No pain            Patient seen for aquatic therapy session to address postural control and decrease tension/ increase activation in LUE.  Patient entered and exited  the water via stairs and one railing.  Patient completed therapy in range from 3-4.5 feet of water.  Patient walking initially with arm adducted, internally rotated, and elbow flexed.  After supine floatation stretch - able to activate abduction, elbow extension, and external rotation.  Worked on traction of left shoulder to improve abduction and external rotation.                        OT Short Term Goals - 07/17/20 1703      OT SHORT TERM GOAL #1   Title I with inital HEP    Time 4    Period Weeks    Status New      OT SHORT TERM GOAL #2   Title Patient will demonstrate low reach to 30 degrees of shoulder flexion with min assist    Time 4    Period Weeks    Status New      OT SHORT TERM GOAL #3   Title Patient will demonstrate 30 degrees of active assited wrist and forearm motion    Time 4    Period Weeks    Status New      OT SHORT TERM GOAL #4   Title xxx      OT SHORT TERM GOAL #5   Title xxx  OT Long Term Goals - 07/17/20 1703      OT LONG TERM GOAL #1   Title I with aquatic HEP    Time 8    Period Weeks    Status New      OT LONG TERM GOAL #2   Title Patient will demonstrate low reach to 45 degrees of shoulder flexion with elbow extension    Time 8    Period Weeks    Status New      OT LONG TERM GOAL #3   Title Patient will demonstrate no pain with passive range of motion to 120 shoulder flexion/abduction    Time 8    Period Weeks    Status New      OT LONG TERM GOAL #4   Title xxx      OT LONG TERM GOAL #5   Title xxx      OT LONG TERM GOAL #6   Title xxx                 Plan - 07/17/20 1702    Clinical Impression Statement Patient responding well to aquatic OT.    OT Occupational Profile and History Detailed Assessment- Review of Records and additional review of physical, cognitive, psychosocial history related to current functional performance    Occupational performance deficits (Please refer to  evaluation for details): ADL's;IADL's;Leisure;Social Participation;Work;Play    Body Structure / Function / Physical Skills ADL;Balance;Endurance;Mobility;Strength;UE functional use;FMC;Vision;Coordination;Gait;Decreased knowledge of precautions;GMC;ROM;Decreased knowledge of use of DME;Dexterity;IADL;Sensation;Tone;Body Banker;Safety Awareness;Thought;Understand    Rehab Potential Good    Clinical Decision Making Limited treatment options, no task modification necessary    Comorbidities Affecting Occupational Performance: May have comorbidities impacting occupational performance    Modification or Assistance to Complete Evaluation  No modification of tasks or assist necessary to complete eval    OT Frequency 2x / week    OT Duration 8 weeks    OT Treatment/Interventions Self-care/ADL training;Therapeutic exercise;Functional Mobility Training;Balance training;Splinting;Manual Therapy;Neuromuscular education;Aquatic Therapy;Ultrasound;Energy conservation;Therapeutic activities;Cryotherapy;Paraffin;DME and/or AE instruction;Cognitive remediation/compensation;Visual/perceptual remediation/compensation;Gait Training;Fluidtherapy;Electrical Stimulation;Moist Heat;Contrast Bath;Passive range of motion;Patient/family education    Plan aquatic therapy and clinic therapy to address spasticity    Consulted and Agree with Plan of Care Patient;Family member/caregiver    Family Member Consulted wife April           Patient will benefit from skilled therapeutic intervention in order to improve the following deficits and impairments:   Body Structure / Function / Physical Skills: ADL,Balance,Endurance,Mobility,Strength,UE functional use,FMC,Vision,Coordination,Gait,Decreased knowledge of precautions,GMC,ROM,Decreased knowledge of use of DME,Dexterity,IADL,Sensation,Tone,Body mechanics Cognitive Skills: Attention,Memory,Problem Solve,Safety  Awareness,Thought,Understand     Visit Diagnosis: Hemiplegia and hemiparesis following cerebral infarction affecting left non-dominant side ([facility])  Muscle weakness (generalized)  Unsteadiness on feet  Visuospatial deficit  Other symptoms and signs involving the nervous system  Other disturbances of skin sensation    Problem List Patient Active Problem List   Diagnosis Date Noted  . History of CVA (cerebrovascular accident) 01/19/2020  . Normocytic anemia   . Benign essential HTN   . Hyponatremia   . Acute blood loss anemia   . Hypotension due to drugs   . Vascular headache   . Prerenal azotemia   . Sleep disturbance   . Leukocytosis   . Transaminitis   . Marijuana abuse   . Essential hypertension   . Dysphagia, post-stroke   . Left hemiparesis ([place])   . Acute respiratory failure ([place])   . ICH (intracerebral hemorrhage) ([place])  12/06/2019    Mariah Milling, OTR/L 07/17/2020, 5:04 PM  [facility] [address] [place], [place], [address] Phone: [phone]   Fax:  [phone]  Name: Dennis Mercado MRN: [id number] Date of Birth: 1981/01/24

## 2020-07-17 NOTE — Telephone Encounter (Signed)
Patient is needing a referral to continue outpatient therapy over at outpatient Neuro.  Please call patient once the order is placed.

## 2020-07-23 ENCOUNTER — Other Ambulatory Visit: Payer: Self-pay | Admitting: Family

## 2020-08-07 ENCOUNTER — Encounter: Payer: Self-pay | Admitting: Occupational Therapy

## 2020-08-07 ENCOUNTER — Ambulatory Visit: Payer: Managed Care, Other (non HMO) | Attending: Physician Assistant | Admitting: Occupational Therapy

## 2020-08-07 DIAGNOSIS — I69354 Hemiplegia and hemiparesis following cerebral infarction affecting left non-dominant side: Secondary | ICD-10-CM | POA: Insufficient documentation

## 2020-08-07 DIAGNOSIS — M6281 Muscle weakness (generalized): Secondary | ICD-10-CM | POA: Insufficient documentation

## 2020-08-07 DIAGNOSIS — R2681 Unsteadiness on feet: Secondary | ICD-10-CM | POA: Insufficient documentation

## 2020-08-07 DIAGNOSIS — R41842 Visuospatial deficit: Secondary | ICD-10-CM | POA: Diagnosis not present

## 2020-08-07 DIAGNOSIS — R29818 Other symptoms and signs involving the nervous system: Secondary | ICD-10-CM | POA: Insufficient documentation

## 2020-08-07 DIAGNOSIS — R208 Other disturbances of skin sensation: Secondary | ICD-10-CM | POA: Diagnosis not present

## 2020-08-07 NOTE — Therapy (Signed)
[facility] [address] [place], [place], [address] Phone: [phone]   Fax:  [phone]  Occupational Therapy Treatment  Patient Details  Name: Dennis Mercado MRN: [id number] Date of Birth: 1981/02/28 Referring Provider (OT): Dr. Letta Pate   Encounter Date: 08/07/2020   OT End of Session - 08/07/20 1706    Visit Number 3    Number of Visits 16    Date for OT Re-Evaluation 09/23/20    Authorization Type need to be determined    Authorization Time Period 60 visit limit    Authorization - Visit Number 3    Authorization - Number of Visits 16    OT Start Time 6270    OT Stop Time 1500    OT Time Calculation (min) 53 min    Activity Tolerance Patient tolerated treatment well    Behavior During Therapy [facility] for tasks assessed/performed           History reviewed. No pertinent past medical history.  History reviewed. No pertinent surgical history.  There were no vitals filed for this visit.   Subjective Assessment - 08/07/20 1704    Subjective  I have been trying to work on grasping things - it's hard.    Patient is accompanied by: Family member    Pertinent History Pt. presented to ED on 12/06/2019 with left side weakness and  altered mental status s/p coitus.  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 Pt required intubation 6/22 due to ARF secondary to aspiration PNA. Received inpatient rehab. Pt was discharged 7/28/2    Limitations HTN, left inattention, apraxia    Patient Stated Goals to be able to use my hand    Currently in Pain? No/denies    Pain Score 0-No pain           Patient seen today for aquatic therapy visit.  Patient seen in 3.5-4.5 ft of water.  Worked initially on walking with emphasis on alignment of trunk over base of support and head over trunk.  Followed with realignment of left shoulder girdle and  then worked to improve isolatyed glenohumeral joint motion.  Worked in [employer] bearing on underwater bench - then followed with attempts at active shoulder, forearm and wrist motion.   Patient able to achieve 95 degrees of passive shoulder abduction without discomfort this visit.                       OT Education - 08/07/20 1705    Education Details Alignment to improve trunk, head and shoulder positioning    Person(s) Educated Patient    Methods Explanation;Tactile cues    Comprehension Need further instruction            OT Short Term Goals - 08/07/20 1709      OT SHORT TERM GOAL #1   Title I with inital HEP    Time 4    Period Weeks    Status On-going      OT SHORT TERM GOAL #2   Title Patient will demonstrate low reach to 30 degrees of shoulder flexion with min assist    Time 4    Period Weeks    Status On-going      OT SHORT TERM GOAL #3   Title Patient will demonstrate 30 degrees of active assited wrist and forearm motion    Time 4    Period Weeks  Status On-going      OT SHORT TERM GOAL #4   Title xxx      OT SHORT TERM GOAL #5   Title xxx             OT Long Term Goals - 08/07/20 1709      OT LONG TERM GOAL #1   Title I with aquatic HEP    Time 8    Period Weeks    Status On-going      OT LONG TERM GOAL #2   Title Patient will demonstrate low reach to 45 degrees of shoulder flexion with elbow extension    Time 8    Period Weeks    Status On-going      OT LONG TERM GOAL #3   Title Patient will demonstrate no pain with passive range of motion to 120 shoulder flexion/abduction    Time 8    Period Weeks    Status On-going      OT LONG TERM GOAL #4   Title xxx      OT LONG TERM GOAL #5   Title xxx      OT LONG TERM GOAL #6   Title xxx                 Plan - 08/07/20 1707    Clinical Impression Statement Patient has obtained orders for PT, instructed patient and wife to get set up with OP PT.  Patient with  improved ability to tolerate and maintain more neutral alignment of trunk today following facilitation.  Cueing needed to align head over more neutral spine.    OT Occupational Profile and History Detailed Assessment- Review of Records and additional review of physical, cognitive, psychosocial history related to current functional performance    Occupational performance deficits (Please refer to evaluation for details): ADL's;IADL's;Leisure;Social Participation;Work;Play    Body Structure / Function / Physical Skills ADL;Balance;Endurance;Mobility;Strength;UE functional use;FMC;Vision;Coordination;Gait;Decreased knowledge of precautions;GMC;ROM;Decreased knowledge of use of DME;Dexterity;IADL;Sensation;Tone;Body Banker;Safety Awareness;Thought;Understand    Rehab Potential Good    Clinical Decision Making Limited treatment options, no task modification necessary    Comorbidities Affecting Occupational Performance: May have comorbidities impacting occupational performance    Modification or Assistance to Complete Evaluation  No modification of tasks or assist necessary to complete eval    OT Frequency 2x / week    OT Duration 8 weeks    OT Treatment/Interventions Self-care/ADL training;Therapeutic exercise;Functional Mobility Training;Balance training;Splinting;Manual Therapy;Neuromuscular education;Aquatic Therapy;Ultrasound;Energy conservation;Therapeutic activities;Cryotherapy;Paraffin;DME and/or AE instruction;Cognitive remediation/compensation;Visual/perceptual remediation/compensation;Gait Training;Fluidtherapy;Electrical Stimulation;Moist Heat;Contrast Bath;Passive range of motion;Patient/family education    Plan aquatic therapy and clinic therapy to address spasticity, need left trunk lengthening/right trunk shortening, realignment of shoulder girdle, and isolated glenohumeral joint motion    Consulted and Agree with Plan of Care  Patient;Family member/caregiver    Family Member Consulted wife April           Patient will benefit from skilled therapeutic intervention in order to improve the following deficits and impairments:   Body Structure / Function / Physical Skills: ADL,Balance,Endurance,Mobility,Strength,UE functional use,FMC,Vision,Coordination,Gait,Decreased knowledge of precautions,GMC,ROM,Decreased knowledge of use of DME,Dexterity,IADL,Sensation,Tone,Body mechanics Cognitive Skills: Attention,Memory,Problem Solve,Safety Awareness,Thought,Understand     Visit Diagnosis: Hemiplegia and hemiparesis following cerebral infarction affecting left non-dominant side ([facility])  Visuospatial deficit  Muscle weakness (generalized)  Unsteadiness on feet  Other symptoms and signs involving the nervous system  Other disturbances of skin sensation    Problem List Patient Active Problem List   Diagnosis Date  Noted  . History of CVA (cerebrovascular accident) 01/19/2020  . Normocytic anemia   . Benign essential HTN   . Hyponatremia   . Acute blood loss anemia   . Hypotension due to drugs   . Vascular headache   . Prerenal azotemia   . Sleep disturbance   . Leukocytosis   . Transaminitis   . Marijuana abuse   . Essential hypertension   . Dysphagia, post-stroke   . Left hemiparesis ([place])   . Acute respiratory failure ([place])   . ICH (intracerebral hemorrhage) ([place]) 12/06/2019    Mariah Milling, OTR/L 08/07/2020, 5:10 PM  [facility] [address] [place], [place], [address] Phone: [phone]   Fax:  [phone]  Name: Dennis Mercado MRN: [id number] Date of Birth: [birth year]

## 2020-08-11 ENCOUNTER — Other Ambulatory Visit: Payer: Self-pay

## 2020-08-11 ENCOUNTER — Encounter
Payer: BC Managed Care – PPO | Attending: Physical Medicine & Rehabilitation | Admitting: Physical Medicine & Rehabilitation

## 2020-08-11 ENCOUNTER — Encounter: Payer: Self-pay | Admitting: Physical Medicine & Rehabilitation

## 2020-08-11 VITALS — BP 119/85 | HR 94 | Temp 98.0°F | Ht 67.0 in | Wt 145.4 lb

## 2020-08-11 DIAGNOSIS — G8114 Spastic hemiplegia affecting left nondominant side: Secondary | ICD-10-CM

## 2020-08-11 NOTE — Progress Notes (Signed)
Subjective:    Patient ID: Dennis Mercado, male    DOB: Apr 07, 1981, 40 y.o.   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  The patient has had botulinum toxin injection but because of inadequate response with switch to Dysport. 08/07/20 Dysport inj Left  Biceps500  FPL100 FCR300 Palmaris long 100 FDS200 FDP300 Since about 1 week post injection the patient has noted difficulty bending the left second third and fourth fingers.  The patient would like to go back to work, he has been driving during nonrush hour times and not on the interstate.  He feels comfortable doing this.  He drove to this visit today. 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  He feels like he can still do this.  He will be talking to his employers on Monday.  The patient has restarted OT and will be restarting PT. Pain Inventory Average Pain 0 Pain Right Now 0 My pain is No pain.  LOCATION OF PAIN  na  BOWEL Number of stools per week: nl Oral laxative use No  Type of laxative None Enema or suppository use No  History of colostomy No  Incontinent No   BLADDER Normal In and out cath, frequency N/A Able to self cath No  Bladder incontinence No  Frequent urination No  Leakage with coughing No  Difficulty starting stream No  Incomplete bladder emptying No    Mobility walk with assistance use a cane how many minutes can you walk? 20 MINS ability to climb steps?  yes do you drive?  yes Do you have any goals in this area?  yes  Function employed # of hrs/week Medical Leave since 12/06/2019 I need assistance with the following:  shopping Do you have any goals in this area?  yes  Neuro/Psych weakness  Prior Studies Any changes since last visit?  no  Physicians involved in your care Any changes since last visit?  no   History reviewed. No pertinent family  history. Social History   Socioeconomic History  . Marital status: Married    Spouse name: Not on file  . Number of children: Not on file  . Years of education: Not on file  . Highest education level: Not on file  Occupational History  . Not on file  Tobacco Use  . Smoking status: Never Smoker  . Smokeless tobacco: Never Used  Vaping Use  . Vaping Use: Never used  Substance and Sexual Activity  . Alcohol use: Not Currently    Comment: several drinks over the weekend  . Drug use: Never  . Sexual activity: Not on file  Other Topics Concern  . Not on file  Social History Narrative  . Not on file   Social Determinants of Health   Financial Resource Strain: Not on file  Food Insecurity: Not on file  Transportation Needs: Not on file  Physical Activity: Not on file  Stress: Not on file  Social Connections: Not on file   Past Surgical History:  Procedure Laterality Date  . WISDOM TOOTH EXTRACTION     History reviewed. No pertinent past medical history. BP 119/85   Pulse 94   Temp 98 F (36.7 C)   Ht 5\' 7"  (1.702 m)   Wt 145 lb 6.4 oz (66 kg)   SpO2 99%   BMI 22.77 kg/m  Opioid Risk Score:   Fall Risk Score:  `1  Depression screen PHQ 2/9  Depression screen [facility] 2/9 05/18/2020 04/18/2020 01/25/2020  Decreased Interest 0 0 0  Down, Depressed, Hopeless 0 0 0  PHQ - 2 Score 0 0 0  Altered sleeping - - 0  Tired, decreased energy - - 0  Change in appetite - - 0  Feeling bad or failure about yourself  - - 0  Trouble concentrating - - 0  Moving slowly or fidgety/restless - - 0  Suicidal thoughts - - 0  PHQ-9 Score - - 0   Review of Systems  Musculoskeletal: Positive for gait problem.  Neurological: Positive for weakness.       Weakness in left leg & left arm  All other systems reviewed and are negative.      Objective:   Physical Exam Vitals and nursing note reviewed.  Constitutional:      Appearance: He is normal weight.  Eyes:     Extraocular  Movements: Extraocular movements intact.     Conjunctiva/sclera: Conjunctivae normal.     Pupils: Pupils are equal, round, and reactive to light.  Neurological:     Mental Status: He is alert and oriented to person, place, and time.     Comments: Motor strength is 5/5 in the right deltoid, bicep, tricep, grip, hip flexor, knee extensor, ankle dorsiflexor and plantar flexor Left upper extremity has 4/5 strength in the deltoid bicep tricep 3 at the finger extensors 2 minus at the finger flexors 2 - at the thumb flexor 4/5 left hip flexor knee extensor 4 - at the ankle dorsiflexor Ambulates without assistive device he needs some cues to clear his toes but has no evidence of knee instability.  Psychiatric:        Mood and Affect: Mood normal.           Assessment & Plan:  #1.  History of right thalamic bleed, hypertensive with chronic left spastic hemiparesis.  He has made very good improvement with his overall level of functioning and is back to modified independent with all his self-care and mobility including driving at least during non-REM shower times.  I do think he can continue with this for now and go back to work 20 hours a week for 3 weeks until I see him back in clinic. He also will need to be excused for his physical therapy occupational therapy treatments as well as other physician visits.  Have written a note for the patient Will write for a handicap sticker temporary 21-month  In regards to reduce grip strength on the left, would reduce the next Dysport dose, Biceps 500 units FDP 100 units FDS 100 units FCR 200 units FPL 100 units

## 2020-08-14 ENCOUNTER — Ambulatory Visit: Payer: Managed Care, Other (non HMO) | Admitting: Occupational Therapy

## 2020-08-14 DIAGNOSIS — R2681 Unsteadiness on feet: Secondary | ICD-10-CM | POA: Diagnosis not present

## 2020-08-14 DIAGNOSIS — R41842 Visuospatial deficit: Secondary | ICD-10-CM | POA: Diagnosis not present

## 2020-08-14 DIAGNOSIS — I69354 Hemiplegia and hemiparesis following cerebral infarction affecting left non-dominant side: Secondary | ICD-10-CM | POA: Diagnosis not present

## 2020-08-14 DIAGNOSIS — M6281 Muscle weakness (generalized): Secondary | ICD-10-CM | POA: Diagnosis not present

## 2020-08-14 DIAGNOSIS — R208 Other disturbances of skin sensation: Secondary | ICD-10-CM | POA: Diagnosis not present

## 2020-08-14 DIAGNOSIS — R29818 Other symptoms and signs involving the nervous system: Secondary | ICD-10-CM | POA: Diagnosis not present

## 2020-08-15 ENCOUNTER — Ambulatory Visit: Payer: Managed Care, Other (non HMO) | Attending: Physician Assistant | Admitting: Physical Therapy

## 2020-08-15 ENCOUNTER — Other Ambulatory Visit: Payer: Self-pay

## 2020-08-15 ENCOUNTER — Encounter: Payer: Self-pay | Admitting: Occupational Therapy

## 2020-08-15 ENCOUNTER — Encounter: Payer: Self-pay | Admitting: Physical Therapy

## 2020-08-15 DIAGNOSIS — R2681 Unsteadiness on feet: Secondary | ICD-10-CM | POA: Insufficient documentation

## 2020-08-15 DIAGNOSIS — R208 Other disturbances of skin sensation: Secondary | ICD-10-CM | POA: Diagnosis present

## 2020-08-15 DIAGNOSIS — R2689 Other abnormalities of gait and mobility: Secondary | ICD-10-CM | POA: Insufficient documentation

## 2020-08-15 DIAGNOSIS — R29818 Other symptoms and signs involving the nervous system: Secondary | ICD-10-CM | POA: Diagnosis present

## 2020-08-15 DIAGNOSIS — I69354 Hemiplegia and hemiparesis following cerebral infarction affecting left non-dominant side: Secondary | ICD-10-CM | POA: Insufficient documentation

## 2020-08-15 DIAGNOSIS — R41842 Visuospatial deficit: Secondary | ICD-10-CM | POA: Diagnosis present

## 2020-08-15 DIAGNOSIS — M6281 Muscle weakness (generalized): Secondary | ICD-10-CM | POA: Diagnosis present

## 2020-08-15 NOTE — Therapy (Signed)
[facility] [address] [place], [place], [address] Phone: [phone]   Fax:  [phone]  Occupational Therapy Treatment  Patient Details  Name: Dennis Mercado MRN: [id number] Date of Birth: [birth year] Referring Provider (OT): Dr. Letta Pate   Encounter Date: 08/14/2020   OT End of Session - 08/15/20 0637    Visit Number 4    Number of Visits 16    Date for OT Re-Evaluation 09/23/20    Authorization Type need to be determined    Authorization Time Period 60 visit limit    Authorization - Visit Number 4    Authorization - Number of Visits 16    OT Start Time 3818    OT Stop Time 1500    OT Time Calculation (min) 45 min    Equipment Utilized During Treatment water dumbbells    Activity Tolerance Patient tolerated treatment well    Behavior During Therapy [facility] for tasks assessed/performed           History reviewed. No pertinent past medical history.  Past Surgical History:  Procedure Laterality Date  . WISDOM TOOTH EXTRACTION      There were no vitals filed for this visit.   Subjective Assessment - 08/15/20 0635    Subjective  I have my PT eval tomorrow.  Patient also indicates he is going back to work, and is working with employer on workplace accomodations    Patient is accompanied by: Family member    Pertinent History Pt. presented to ED on 12/06/2019 with left side weakness and  altered mental status s/p coitus.  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 Pt required intubation 6/22 due to ARF secondary to aspiration PNA. Received inpatient rehab. Pt was discharged 7/28/2    Limitations HTN, left inattention, apraxia    Patient Stated Goals to be able to use my hand    Currently in Pain? No/denies    Pain Score 0-No pain          Patient seen for aquatic therapy session. Patient entered and exited the pool via  stairs with supervision.  Patient's session occurred in 3-4.5 feet of water.  Session focused on alignment of trunk and weight shift forward with walking, narrowing base of support, and LUE passive to active range of motion.  Realigned patient's shoulder girdle to help allow actual glenohumeral joint motion then used buoyancy to allow freedom of movement in LUE while seated on underwater bench.                        OT Education - 08/15/20 0636    Education Details Plan to add clinic days to schedule once PT starts    Person(s) Educated Patient    Methods Explanation    Comprehension Verbalized understanding            OT Short Term Goals - 08/15/20 0640      OT SHORT TERM GOAL #1   Title I with inital HEP    Time 4    Period Weeks    Status On-going      OT SHORT TERM GOAL #2   Title Patient will demonstrate low reach to 30 degrees of shoulder flexion with min assist    Time 4    Period Weeks    Status On-going      OT SHORT TERM GOAL #3   Title Patient  will demonstrate 30 degrees of active assited wrist and forearm motion    Time 4    Period Weeks    Status On-going      OT SHORT TERM GOAL #4   Title xxx      OT SHORT TERM GOAL #5   Title xxx             OT Long Term Goals - 08/15/20 0641      OT LONG TERM GOAL #1   Title I with aquatic HEP    Time 8    Period Weeks    Status On-going      OT LONG TERM GOAL #2   Title Patient will demonstrate low reach to 45 degrees of shoulder flexion with elbow extension    Time 8    Period Weeks    Status On-going      OT LONG TERM GOAL #3   Title Patient will demonstrate no pain with passive range of motion to 120 shoulder flexion/abduction    Time 8    Period Weeks    Status On-going      OT LONG TERM GOAL #4   Title xxx      OT LONG TERM GOAL #5   Title xxx      OT LONG TERM GOAL #6   Title xxx                 Plan - 08/15/20 1610    Clinical Impression Statement Patient saw  physiatrist recently and is working on returning to work.  Patient notes significant improvement in walking with emphasis on trunk alignment.  Patient continues to show reduction in tension and improved range of motion in LUE.    OT Occupational Profile and History Detailed Assessment- Review of Records and additional review of physical, cognitive, psychosocial history related to current functional performance    Occupational performance deficits (Please refer to evaluation for details): ADL's;IADL's;Leisure;Social Participation;Work;Play    Body Structure / Function / Physical Skills ADL;Balance;Endurance;Mobility;Strength;UE functional use;FMC;Vision;Coordination;Gait;Decreased knowledge of precautions;GMC;ROM;Decreased knowledge of use of DME;Dexterity;IADL;Sensation;Tone;Body Banker;Safety Awareness;Thought;Understand    Rehab Potential Good    Clinical Decision Making Limited treatment options, no task modification necessary    Comorbidities Affecting Occupational Performance: May have comorbidities impacting occupational performance    Modification or Assistance to Complete Evaluation  No modification of tasks or assist necessary to complete eval    OT Frequency 2x / week    OT Duration 8 weeks    OT Treatment/Interventions Self-care/ADL training;Therapeutic exercise;Functional Mobility Training;Balance training;Splinting;Manual Therapy;Neuromuscular education;Aquatic Therapy;Ultrasound;Energy conservation;Therapeutic activities;Cryotherapy;Paraffin;DME and/or AE instruction;Cognitive remediation/compensation;Visual/perceptual remediation/compensation;Gait Training;Fluidtherapy;Electrical Stimulation;Moist Heat;Contrast Bath;Passive range of motion;Patient/family education    Plan aquatic therapy and clinic therapy to address spasticity, need left trunk lengthening/right trunk shortening, realignment of shoulder girdle, and isolated glenohumeral  joint motion    Consulted and Agree with Plan of Care Patient;Family member/caregiver    Family Member Consulted wife April           Patient will benefit from skilled therapeutic intervention in order to improve the following deficits and impairments:   Body Structure / Function / Physical Skills: ADL,Balance,Endurance,Mobility,Strength,UE functional use,FMC,Vision,Coordination,Gait,Decreased knowledge of precautions,GMC,ROM,Decreased knowledge of use of DME,Dexterity,IADL,Sensation,Tone,Body mechanics Cognitive Skills: Attention,Memory,Problem Solve,Safety Awareness,Thought,Understand     Visit Diagnosis: Hemiplegia and hemiparesis following cerebral infarction affecting left non-dominant side (HCC)  Muscle weakness (generalized)  Unsteadiness on feet  Other symptoms and signs involving the nervous system  Other disturbances of  skin sensation    Problem List Patient Active Problem List   Diagnosis Date Noted  . History of CVA (cerebrovascular accident) 01/19/2020  . Normocytic anemia   . Benign essential HTN   . Hyponatremia   . Acute blood loss anemia   . Hypotension due to drugs   . Vascular headache   . Prerenal azotemia   . Sleep disturbance   . Leukocytosis   . Transaminitis   . Marijuana abuse   . Essential hypertension   . Dysphagia, post-stroke   . Left hemiparesis ([place])   . Acute respiratory failure ([place])   . ICH (intracerebral hemorrhage) ([place]) 12/06/2019    Mariah Milling, OTR/L 08/15/2020, 6:41 AM  [facility] [address] [place], [place], [address] Phone: [phone]   Fax:  [phone]  Name: Eldrick Penick MRN: [id number] Date of Birth: 24-Jan-1981

## 2020-08-15 NOTE — Therapy (Signed)
[facility] [address] [place], [place], [address] Phone: [phone]   Fax:  [phone]  Physical Therapy Evaluation  Patient Details  Name: Dennis Mercado MRN: [id number] Date of Birth: 06-09-1981 Referring Provider (PT): [name], MD   Encounter Date: 08/15/2020   PT End of Session - 08/15/20 1652    Visit Number 1    Number of Visits 17   visits dependent on pt's co pay, written for 1-2x week for 8 weeks   Authorization Type VL: 60 days PT/OT combined  (Same day counts as 1)    PT Start Time 0254    PT Stop Time 1530    PT Time Calculation (min) 45 min    Equipment Utilized During Treatment Gait belt    Activity Tolerance Patient tolerated treatment well    Behavior During Therapy [facility] for tasks assessed/performed           History reviewed. No pertinent past medical history.  Past Surgical History:  Procedure Laterality Date  . WISDOM TOOTH EXTRACTION      There were no vitals filed for this visit.    Subjective Assessment - 08/15/20 1448    Subjective Pool is going good - reports it has definitely helped with his balance and walking. No falls. Gradually returning back to driving. Daytime hours only and not going on the interstate. Looking on going back to work, first on part time and then trying to gradually increase his hours. Still using the [facility]. Has been trying to get in 500 steps everyday    Pertinent History HTN    Limitations Walking    Patient Stated Goals wants to work on not walking with the cane    Currently in Pain? No/denies              [facility] PT Assessment - 08/15/20 1453      Assessment   Medical Diagnosis R thalamic hemorrhage w/ L hemiparesis    Referring Provider (PT) Letta Pate [name], MD    Onset Date/Surgical Date 12/06/19    Hand Dominance Right    Prior Therapy PT/OT at this location      Precautions   Precautions Fall      Balance Screen   Has the  patient fallen in the past 6 months No    Has the patient had a decrease in activity level because of a fear of falling?  No    Is the patient reluctant to leave their home because of a fear of falling?  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   trying to go back to work, currently not working   Leisure has a Dentist Arts development officer) - take her on walks again, dancing (roomba and ballroom), go to [employer] and walking on the sand      Sensation   Light  Touch Appears Intact      Coordination   Gross Motor Movements are Fluid and Coordinated No    Heel Shin Test WNL RLE, slower to perform with LLE      ROM / Strength   AROM / PROM / Strength Strength      Strength   Right Hip Flexion 5/5    Left Hip Flexion 4+/5    Right Knee Flexion 5/5    Right Knee Extension 5/5    Left Knee Flexion 4+/5    Left Knee Extension 4+/5    Left Ankle Dorsiflexion --   unable to assess due to pt wearing AFO   Left Ankle Plantar Flexion --   unable to assess due to pt wearing AFO     Transfers   Sit to Stand 4: Min guard;Without upper extremity assist;From chair/3-in-1;5: Supervision    Sit to Stand Details (indicate cue type and reason) slightly more narrowed BOS    Five time sit to stand comments  22.85 seconds, no UE support from chair    Stand to Sit Without upper extremity assist;5: Supervision      Ambulation/Gait   Ambulation/Gait Yes    Ambulation/Gait Assistance 5: Supervision;4: Min  guard    Ambulation Distance (Feet) --   CLINIC DISTANCES   Assistive device Small based quad cane    Gait Pattern Decreased stance time - left;Decreased hip/knee flexion - left;Decreased weight shift to left;Narrow base of support;Step-through pattern;Decreased arm swing - left    Ambulation Surface Level;Indoor    Gait velocity 24.25 ft/sec = 1.35 ft/sec    Stairs Yes    Stairs Assistance 4: Min guard    Stair Management Technique Step to pattern;One rail Right;With cane    Number of Stairs 8    Height of Stairs 6      Standardized Balance Assessment   Standardized Balance Assessment Timed Up and Go Test;Berg Balance Test      Berg Balance Test   Sit to Stand Able to stand without using hands and stabilize independently    Standing Unsupported Able to stand safely 2 minutes    Sitting with Back Unsupported but Feet Supported on Floor or Stool Able to sit safely and securely 2 minutes    Stand to Sit Sits safely with minimal use of hands    Transfers Able to transfer safely, minor use of hands    Standing Unsupported with Eyes Closed Able to stand 10 seconds safely    Standing Unsupported with Feet Together Able to place feet together independently and stand 1 minute safely      Timed Up and Go Test   Normal TUG (seconds) 23.91    TUG Comments with SBQC                      Objective measurements completed on examination: See above findings.               PT Education - 08/15/20 1652    Education Details clinical findings, POC, copay with PT/OT - pt to call insurance and figure out copays/what he will have to pay and will call back to schedule for therapies    Person(s) Educated Patient    Methods Explanation    Comprehension Verbalized understanding            PT Short Term Goals - 08/15/20 1654      PT SHORT TERM GOAL #1   Title Pt will undergo further  assessment of BERG with LTG written as appropriate. ALL STGS DUE 09/19/20.    Time 5   due to  delay in scheduling   Period Weeks    Status New    Target Date 09/19/20      PT SHORT TERM GOAL #2   Title Pt will perform TUG in 20 seconds or less with SBQC in order to demo decr fall risk.    Baseline 23.91 seconds    Time 5    Period Weeks    Status New      PT SHORT TERM GOAL #3   Title Pt will improve gait speed with SBQC to at least 1.6 ft/sec in order to demo decr fall risk.    Baseline 1.35 ft/sec    Time 5    Period Weeks    Status New      PT SHORT TERM GOAL #4   Title Pt will perform 4 steps with use of railing and or SBQC with supervision in order to demo improved community access.    Time 5    Period Weeks    Status New      PT SHORT TERM GOAL #5   Title Pt will perform 5x sit <> stand in 20 seconds or less with no UE support from chair in order to demo decr fall risk.    Baseline 22.85 seconds    Time 5    Period Weeks    Status New             PT Long Term Goals - 08/15/20 1658      PT LONG TERM GOAL #1   Title Pt and pt's spouse will be independent with final HEP in order to build upon functional gains made in therapy. ALL LTGS DUE 10/17/20    Time 9   due to delay in scheduling   Period Weeks    Status New    Target Date 10/17/20      PT LONG TERM GOAL #2   Title Pt will ambulate 87' with no AD vs. LRAD over level indoor surfaces with supervision in order to demo improved household mobility.    Time 9    Period Weeks    Status New      PT LONG TERM GOAL #3   Title Pt will perform TUG in 18 seconds or less with LRAD in order to demo decr fall risk.    Baseline 23.91 seconds    Time 9    Period Weeks    Status New      PT LONG TERM GOAL #4   Title Pt will perform 5x sit <> stand in 17 seconds or less with no UE support from chair in order to demo decr fall risk.    Baseline 22.85 seconds    Time 9    Period Weeks    Status New      PT LONG TERM GOAL #5   Title BERG goal to be written as appropriate.    Time 9    Period Weeks     Status New                  Plan - 08/15/20 1653    Clinical Impression Statement Patient is a 39 year old male referred to Neuro OPPT s/p right thalamic hemorrhage (11/2019).  Pt seen at this clinic last year (had to stop due to visit limit between PT/OT). Pt's PMH is significant for: HTN. The  following deficits were present during the exam:  impaired tone on LLE, decr LLE strength, gait abnormalities, impaired balance, decr coordination. Based on TUG and 5x sit <> stand pt is at a high risk for falls. Pt's gait speed with SBQC indicates that pt is at an incr risk for falls. Pt would benefit from skilled PT to address these impairments and functional limitations to maximize functional mobility independence    Personal Factors and Comorbidities Comorbidity 1;Past/Current Experience;Profession    Comorbidities HTN    Examination-Activity Limitations Stand;Stairs;Squat;Transfers;Locomotion Level    Examination-Participation Restrictions Community Activity;Occupation   walking his dog   Stability/Clinical Decision Making Stable/Uncomplicated    Rehab Potential Good    PT Frequency 1x / week   1-2   PT Duration 8 weeks    PT Treatment/Interventions ADLs/Self Care Home Management;Aquatic Therapy;Electrical Stimulation;DME Instruction;Gait training;Stair training;Functional mobility training;Therapeutic activities;Therapeutic exercise;Balance training;Neuromuscular re-education;Wheelchair mobility training;Orthotic Fit/Training;Patient/family education;Passive range of motion;Energy conservation;Vestibular    PT Next Visit Plan finish checking BERG, review HEP and revise as appropriate. work on gait with SPC with quad tip, balance strategies on different surfaces/decr UE support.    PT Home Exercise Plan [id number]    Consulted and Agree with Plan of Care Patient           Patient will benefit from skilled therapeutic intervention in order to improve the following deficits and impairments:   Abnormal gait,Decreased activity tolerance,Decreased balance,Decreased mobility,Decreased knowledge of use of DME,Decreased coordination,Decreased range of motion,Decreased strength,Difficulty walking,Impaired tone,Impaired sensation,Postural dysfunction  Visit Diagnosis: Hemiplegia and hemiparesis following cerebral infarction affecting left non-dominant side (HCC)  Muscle weakness (generalized)  Unsteadiness on feet  Other symptoms and signs involving the nervous system  Other abnormalities of gait and mobility     Problem List Patient Active Problem List   Diagnosis Date Noted  . History of CVA (cerebrovascular accident) 01/19/2020  . Normocytic anemia   . Benign essential HTN   . Hyponatremia   . Acute blood loss anemia   . Hypotension due to drugs   . Vascular headache   . Prerenal azotemia   . Sleep disturbance   . Leukocytosis   . Transaminitis   . Marijuana abuse   . Essential hypertension   . Dysphagia, post-stroke   . Left hemiparesis ([place])   . Acute respiratory failure ([place])   . ICH (intracerebral hemorrhage) ([place]) 12/06/2019    Arliss Journey, PT, DPT  08/15/2020, 5:06 PM  [facility] [address] [place], [place], [address] Phone: [phone]   Fax:  [phone]  Name: Evren Shankland MRN: [id number] Date of Birth: 22-Jan-1981

## 2020-08-18 ENCOUNTER — Encounter: Payer: BC Managed Care – PPO | Admitting: Physical Medicine & Rehabilitation

## 2020-08-21 ENCOUNTER — Encounter: Payer: Self-pay | Admitting: Occupational Therapy

## 2020-08-21 ENCOUNTER — Ambulatory Visit: Payer: Managed Care, Other (non HMO) | Admitting: Occupational Therapy

## 2020-08-21 DIAGNOSIS — I69354 Hemiplegia and hemiparesis following cerebral infarction affecting left non-dominant side: Secondary | ICD-10-CM | POA: Diagnosis not present

## 2020-08-21 NOTE — Therapy (Signed)
[facility] [address] [place], [place], [address] Phone: [phone]   Fax:  [phone]  Occupational Therapy Treatment  Patient Details  Name: Dennis Mercado MRN: [id number] Date of Birth: Dec 20, 1980 Referring Provider (OT): Dr. Letta Pate   Encounter Date: 08/21/2020   OT End of Session - 08/21/20 1747    Visit Number 5    Number of Visits 16    Date for OT Re-Evaluation 09/23/20    Authorization Time Period per patient 30 visit OT limit    Authorization - Visit Number 5    Authorization - Number of Visits 16    OT Start Time 4650    OT Stop Time 1500    OT Time Calculation (min) 45 min    Activity Tolerance Patient tolerated treatment well    Behavior During Therapy [facility] for tasks assessed/performed           History reviewed. No pertinent past medical history.  Past Surgical History:  Procedure Laterality Date  . WISDOM TOOTH EXTRACTION      There were no vitals filed for this visit.   Subjective Assessment - 08/21/20 1746    Subjective  I have 30 OT visits, and 60 PT visits.    Patient is accompanied by: Family member    Pertinent History Pt. presented to ED on 12/06/2019 with left side weakness and  altered mental status s/p coitus.  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 Pt required intubation 6/22 due to ARF secondary to aspiration PNA. Received inpatient rehab. Pt was discharged 7/28/2    Limitations HTN, left inattention, apraxia    Patient Stated Goals to be able to use my hand    Currently in Pain? No/denies    Pain Score 0-No pain           Patient seen for aquatic therapy.  Patient entered and exited the pool via stairs.  Entered with close supervision, and exited with intermittent min assist as he used step through pattern with left hand on hand rail.  Patient seen in 3.5-4 ft of water.   Focus of  today's session was isolate movement of [place] joint, and also to separate movement of body from movement of LUE.  Worked seated on underwater bench to address shoulder flex/abd with elbow extension following realignment of shoulder girdle.  Worked with BUE stabilized on water dumbbell to reach forward, then complete sit to stand.  Patient initially had difficulty maintaining left hand in contact did better with repetition and continued stretch.                         OT Short Term Goals - 08/21/20 1750      OT SHORT TERM GOAL #1   Title I with inital HEP    Time 4    Period Weeks    Status On-going      OT SHORT TERM GOAL #2   Title Patient will demonstrate low reach to 30 degrees of shoulder flexion with min assist    Time 4    Period Weeks    Status On-going      OT SHORT TERM GOAL #3   Title Patient will demonstrate 30 degrees of active assited wrist and forearm motion    Time 4    Period Weeks    Status On-going      OT SHORT TERM  GOAL #4   Title xxx      OT SHORT TERM GOAL #5   Title xxx             OT Long Term Goals - 08/21/20 1750      OT LONG TERM GOAL #1   Title I with aquatic HEP    Time 8    Period Weeks    Status On-going      OT LONG TERM GOAL #2   Title Patient will demonstrate low reach to 45 degrees of shoulder flexion with elbow extension    Time 8    Period Weeks    Status On-going      OT LONG TERM GOAL #3   Title Patient will demonstrate no pain with passive range of motion to 120 shoulder flexion/abduction    Time 8    Period Weeks    Status On-going      OT LONG TERM GOAL #4   Title xxx      OT LONG TERM GOAL #5   Title xxx      OT LONG TERM GOAL #6   Title xxx                 Plan - 08/21/20 1748    Clinical Impression Statement Patient has restarted PT, and is working on his return to work.  Will modify schedule to alternate OT between clinic and pool, continuing 1x/week.    OT Occupational Profile and  History Detailed Assessment- Review of Records and additional review of physical, cognitive, psychosocial history related to current functional performance    Occupational performance deficits (Please refer to evaluation for details): ADL's;IADL's;Leisure;Social Participation;Work;Play    Body Structure / Function / Physical Skills ADL;Balance;Endurance;Mobility;Strength;UE functional use;FMC;Vision;Coordination;Gait;Decreased knowledge of precautions;GMC;ROM;Decreased knowledge of use of DME;Dexterity;IADL;Sensation;Tone;Body Banker;Safety Awareness;Thought;Understand    Rehab Potential Good    Clinical Decision Making Limited treatment options, no task modification necessary    Comorbidities Affecting Occupational Performance: May have comorbidities impacting occupational performance    Modification or Assistance to Complete Evaluation  No modification of tasks or assist necessary to complete eval    OT Frequency 2x / week    OT Duration 8 weeks    OT Treatment/Interventions Self-care/ADL training;Therapeutic exercise;Functional Mobility Training;Balance training;Splinting;Manual Therapy;Neuromuscular education;Aquatic Therapy;Ultrasound;Energy conservation;Therapeutic activities;Cryotherapy;Paraffin;DME and/or AE instruction;Cognitive remediation/compensation;Visual/perceptual remediation/compensation;Gait Training;Fluidtherapy;Electrical Stimulation;Moist Heat;Contrast Bath;Passive range of motion;Patient/family education    Plan aquatic therapy and clinic therapy to address spasticity, need left trunk lengthening/right trunk shortening, realignment of shoulder girdle, and isolated glenohumeral joint motion    Consulted and Agree with Plan of Care Patient;Family member/caregiver    Family Member Consulted wife April           Patient will benefit from skilled therapeutic intervention in order to improve the following deficits and  impairments:   Body Structure / Function / Physical Skills: ADL,Balance,Endurance,Mobility,Strength,UE functional use,FMC,Vision,Coordination,Gait,Decreased knowledge of precautions,GMC,ROM,Decreased knowledge of use of DME,Dexterity,IADL,Sensation,Tone,Body mechanics Cognitive Skills: Attention,Memory,Problem Solve,Safety Awareness,Thought,Understand     Visit Diagnosis: Hemiplegia and hemiparesis following cerebral infarction affecting left non-dominant side (HCC)  Muscle weakness (generalized)  Unsteadiness on feet  Other symptoms and signs involving the nervous system  Other disturbances of skin sensation  Visuospatial deficit    Problem List Patient Active Problem List   Diagnosis Date Noted  . History of CVA (cerebrovascular accident) 01/19/2020  . Normocytic anemia   . Benign essential HTN   . Hyponatremia   . Acute blood loss  anemia   . Hypotension due to drugs   . Vascular headache   . Prerenal azotemia   . Sleep disturbance   . Leukocytosis   . Transaminitis   . Marijuana abuse   . Essential hypertension   . Dysphagia, post-stroke   . Left hemiparesis ([place])   . Acute respiratory failure ([place])   . ICH (intracerebral hemorrhage) ([place]) 12/06/2019    Mariah Milling, OTR/L 08/21/2020, 5:51 PM  [facility] [address] [place], [place], [address] Phone: [phone]   Fax:  [phone]  Name: Dennis Mercado MRN: [id number] Date of Birth: 11-Aug-1980

## 2020-08-22 ENCOUNTER — Other Ambulatory Visit: Payer: Self-pay

## 2020-08-22 ENCOUNTER — Ambulatory Visit: Payer: Managed Care, Other (non HMO) | Admitting: Physical Therapy

## 2020-08-22 ENCOUNTER — Encounter: Payer: Self-pay | Admitting: Physical Therapy

## 2020-08-22 DIAGNOSIS — M6281 Muscle weakness (generalized): Secondary | ICD-10-CM

## 2020-08-22 DIAGNOSIS — I69354 Hemiplegia and hemiparesis following cerebral infarction affecting left non-dominant side: Secondary | ICD-10-CM

## 2020-08-22 DIAGNOSIS — R29818 Other symptoms and signs involving the nervous system: Secondary | ICD-10-CM

## 2020-08-22 DIAGNOSIS — R2681 Unsteadiness on feet: Secondary | ICD-10-CM

## 2020-08-22 NOTE — Therapy (Signed)
[facility] [address] [place], [place], [address] Phone: [phone]   Fax:  [phone]  Physical Therapy Treatment  Patient Details  Name: Azariah Bonura MRN: [id number] Date of Birth: 02/17/1981 Referring Provider (PT): Letta Pate [name], MD   Encounter Date: 08/22/2020   PT End of Session - 08/22/20 1641    Visit Number 2    Number of Visits 17   visits dependent on pt's co pay, written for 1-2x week for 8 weeks   Authorization Type VL: 13 visits PT, 30 OT    Authorization - Visit Number 1    Authorization - Number of Visits 60    PT Start Time 7867    PT Stop Time 1532    PT Time Calculation (min) 45 min    Equipment Utilized During Treatment --    Activity Tolerance Patient tolerated treatment well    Behavior During Therapy [facility] for tasks assessed/performed           History reviewed. No pertinent past medical history.  Past Surgical History:  Procedure Laterality Date  . WISDOM TOOTH EXTRACTION      There were no vitals filed for this visit.   Subjective Assessment - 08/22/20 1450    Subjective Same old stuff. Pool was good yesterday.    Pertinent History HTN    Limitations Walking    Patient Stated Goals wants to work on not walking with the cane    Currently in Pain? No/denies              [facility] PT Assessment - 08/22/20 1453      Berg Balance Test   Sit to Stand Able to stand without using hands and stabilize independently    Standing Unsupported Able to stand safely 2 minutes    Sitting with Back Unsupported but Feet Supported on Floor or Stool Able to sit safely and securely 2 minutes    Stand to Sit Sits safely with minimal use of hands    Transfers Able to transfer safely, minor use of hands    Standing Unsupported with Eyes Closed Able to stand 10 seconds safely    Standing Unsupported with Feet Together Able to place feet together independently and stand 1 minute safely     From Standing, Reach Forward with Outstretched Arm Can reach confidently >25 cm (10")    From Standing Position, Pick up Object from Floor Able to pick up shoe, needs supervision    From Standing Position, Turn to Look Behind Over each Shoulder Looks behind from both sides and weight shifts well    Turn 360 Degrees Able to turn 360 degrees safely but slowly   11 seconds to R, 10 seconds to L   Standing Unsupported, Alternately Place Feet on Step/Stool Needs assistance to keep from falling or unable to try    Standing Unsupported, One Foot in Front Able to plae foot ahead of the other independently and hold 30 seconds    Standing on One Leg Tries to lift leg/unable to hold 3 seconds but remains standing independently    Total Score 45    Berg comment: 45/56 = significant fall risk                        Access Code: [email or web address] URL: [email or web address] Date: 08/22/2020 Prepared by: Janann August  Reviewed and upgraded HEP from previous bout of therapy: See MedBridge for more details.   Exercises Supine  March with Resistance Band - 1-2 x daily - 5 x weekly - 1-2 sets - 10 reps - with green tband, cues for slowed and controlled.  Sit to Stand with Armchair - 1 x daily - 5 x weekly - 2 sets - 5 reps - cues to have LLE slightly behind RLE for incr weight bearing  Small Range Straight Leg Raise - 1 x daily - 5 x weekly - 2 sets - 5 reps Marching Bridge - 1 x daily - 5 x weekly - 2 sets - 5 reps - lifting into bridge first and then lifting RLE up for incr weight bearing through LLE  Seated Heel Slide - 1 x daily - 5 x weekly - 2 sets - 10 reps - cues for technique  Seated Long Arc Quad - 2 x daily - 5 x weekly - 2 sets - 10 reps - cues for posture.  Seated Hamstring Stretch - 1 x daily - 5 x weekly - 3 sets - 30 hold          PT Education - 08/22/20 1641    Education Details reviewed and upgraded HEP, getting scheduled for additional appts    Person(s)  Educated Patient    Methods Explanation;Demonstration;Handout    Comprehension Verbalized understanding;Returned demonstration            PT Short Term Goals - 08/22/20 1645      PT SHORT TERM GOAL #1   Title Pt will undergo further assessment of BERG with LTG written as appropriate. ALL STGS DUE 09/19/20.    Baseline 45/56 on 08/22/20, LTG revised    Time 5   due to delay in scheduling   Period Weeks    Status Achieved    Target Date 09/19/20      PT SHORT TERM GOAL #2   Title Pt will perform TUG in 20 seconds or less with SBQC in order to demo decr fall risk.    Baseline 23.91 seconds    Time 5    Period Weeks    Status New      PT SHORT TERM GOAL #3   Title Pt will improve gait speed with SBQC to at least 1.6 ft/sec in order to demo decr fall risk.    Baseline 1.35 ft/sec    Time 5    Period Weeks    Status New      PT SHORT TERM GOAL #4   Title Pt will perform 4 steps with use of railing and or SBQC with supervision in order to demo improved community access.    Time 5    Period Weeks    Status New      PT SHORT TERM GOAL #5   Title Pt will perform 5x sit <> stand in 20 seconds or less with no UE support from chair in order to demo decr fall risk.    Baseline 22.85 seconds    Time 5    Period Weeks    Status New             PT Long Term Goals - 08/22/20 1646      PT LONG TERM GOAL #1   Title Pt and pt's spouse will be independent with final HEP in order to build upon functional gains made in therapy. ALL LTGS DUE 10/17/20    Time 9   due to delay in scheduling   Period Weeks    Status New      PT  LONG TERM GOAL #2   Title Pt will ambulate 64' with no AD vs. LRAD over level indoor surfaces with supervision in order to demo improved household mobility.    Time 9    Period Weeks    Status New      PT LONG TERM GOAL #3   Title Pt will perform TUG in 18 seconds or less with LRAD in order to demo decr fall risk.    Baseline 23.91 seconds    Time 9     Period Weeks    Status New      PT LONG TERM GOAL #4   Title Pt will perform 5x sit <> stand in 17 seconds or less with no UE support from chair in order to demo decr fall risk.    Baseline 22.85 seconds    Time 9    Period Weeks    Status New      PT LONG TERM GOAL #5   Title Pt will improve BERG to at least a 48/56 in order to demo decr fall risk.    Baseline 45/56    Time 9    Period Weeks    Status Revised                 Plan - 08/22/20 1649    Clinical Impression Statement Performed the BERG with pt scoring a 45/56, indicating that pt is at a significant risk for falls - with pt with most difficulty with SLS and turns. Reviewed and upgraded pt's HEP for strengthening from previous bout of therapy, pt tolerated session well. Will continue to progress towards LTGs.    Personal Factors and Comorbidities Comorbidity 1;Past/Current Experience;Profession    Comorbidities HTN    Examination-Activity Limitations Stand;Stairs;Squat;Transfers;Locomotion Level    Examination-Participation Restrictions Community Activity;Occupation   walking his dog   Stability/Clinical Decision Making Stable/Uncomplicated    Rehab Potential Good    PT Frequency 1x / week   1-2   PT Duration 8 weeks    PT Treatment/Interventions ADLs/Self Care Home Management;Aquatic Therapy;Electrical Stimulation;DME Instruction;Gait training;Stair training;Functional mobility training;Therapeutic activities;Therapeutic exercise;Balance training;Neuromuscular re-education;Wheelchair mobility training;Orthotic Fit/Training;Patient/family education;Passive range of motion;Energy conservation;Vestibular    PT Next Visit Plan how is HEP?- add as appropriate. try gait with SPC with quad tip (didn't get to do this today), balance strategies on different surfaces/decr UE support, NMR/strengthening for LLE.    PT Home Exercise Plan [email or web address]    Consulted and Agree with Plan of Care Patient           Patient will  benefit from skilled therapeutic intervention in order to improve the following deficits and impairments:  Abnormal gait,Decreased activity tolerance,Decreased balance,Decreased mobility,Decreased knowledge of use of DME,Decreased coordination,Decreased range of motion,Decreased strength,Difficulty walking,Impaired tone,Impaired sensation,Postural dysfunction  Visit Diagnosis: Hemiplegia and hemiparesis following cerebral infarction affecting left non-dominant side (HCC)  Muscle weakness (generalized)  Unsteadiness on feet  Other symptoms and signs involving the nervous system     Problem List Patient Active Problem List   Diagnosis Date Noted  . History of CVA (cerebrovascular accident) 01/19/2020  . Normocytic anemia   . Benign essential HTN   . Hyponatremia   . Acute blood loss anemia   . Hypotension due to drugs   . Vascular headache   . Prerenal azotemia   . Sleep disturbance   . Leukocytosis   . Transaminitis   . Marijuana abuse   . Essential hypertension   . Dysphagia, post-stroke   . Left hemiparesis (  [place])   . Acute respiratory failure ([place])   . ICH (intracerebral hemorrhage) ([place]) 12/06/2019    Arliss Journey, PT, DPT  08/22/2020, 4:51 PM  [facility] [address] [place], [place], [address] Phone: [phone]   Fax:  [phone]  Name: Jerrid Forgette MRN: [id number] Date of Birth: 11/30/1980

## 2020-08-22 NOTE — Patient Instructions (Addendum)
Access Code: [email or web address] URL: [email or web address] Date: 08/22/2020 Prepared by: Janann August  Exercises Supine March with Resistance Band - 1-2 x daily - 5 x weekly - 1-2 sets - 10 reps Sit to Stand with Armchair - 1 x daily - 5 x weekly - 2 sets - 5 reps Small Range Straight Leg Raise - 1 x daily - 5 x weekly - 2 sets - 5 reps Marching Bridge - 1 x daily - 5 x weekly - 2 sets - 5 reps Seated Heel Slide - 1 x daily - 5 x weekly - 2 sets - 10 reps Seated Long Arc Quad - 2 x daily - 5 x weekly - 2 sets - 10 reps Seated Hamstring Stretch - 1 x daily - 5 x weekly - 3 sets - 30 hold

## 2020-08-28 ENCOUNTER — Ambulatory Visit: Payer: Managed Care, Other (non HMO) | Admitting: Occupational Therapy

## 2020-08-31 ENCOUNTER — Ambulatory Visit: Payer: Managed Care, Other (non HMO)

## 2020-08-31 ENCOUNTER — Other Ambulatory Visit: Payer: Self-pay

## 2020-08-31 DIAGNOSIS — I69354 Hemiplegia and hemiparesis following cerebral infarction affecting left non-dominant side: Secondary | ICD-10-CM

## 2020-08-31 DIAGNOSIS — R2681 Unsteadiness on feet: Secondary | ICD-10-CM

## 2020-08-31 DIAGNOSIS — R2689 Other abnormalities of gait and mobility: Secondary | ICD-10-CM

## 2020-08-31 DIAGNOSIS — M6281 Muscle weakness (generalized): Secondary | ICD-10-CM

## 2020-08-31 NOTE — Therapy (Signed)
[facility] [address] [place], [place], [address] Phone: [phone]   Fax:  [phone]  Physical Therapy Treatment  Patient Details  Name: Dennis Mercado MRN: [id number] Date of Birth: [birth year] Referring Provider (PT): Letta Pate [name], MD   Encounter Date: 08/31/2020   PT End of Session - 08/31/20 1452    Visit Number 3    Number of Visits 17   visits dependent on pt's co pay, written for 1-2x week for 8 weeks   Authorization Type VL: 60 visits PT, 30 OT    Authorization - Visit Number 1    Authorization - Number of Visits 60    PT Start Time 6294    PT Stop Time 1530    PT Time Calculation (min) 41 min    Equipment Utilized During Treatment Gait belt    Activity Tolerance Patient tolerated treatment well    Behavior During Therapy [facility] for tasks assessed/performed           History reviewed. No pertinent past medical history.  Past Surgical History:  Procedure Laterality Date  . WISDOM TOOTH EXTRACTION      There were no vitals filed for this visit.   Subjective Assessment - 08/31/20 1451    Subjective Patient reports no new changes. No pain to report.    Pertinent History HTN    Limitations Walking    Patient Stated Goals wants to work on not walking with the cane    Currently in Pain? No/denies              [facility] Adult PT Treatment/Exercise - 08/31/20 0001      Transfers   Transfers Sit to Stand;Stand to Sit    Sit to Stand 5: Supervision;4: Min guard    Stand to Sit 5: Supervision;4: Min guard    Comments completed sit <> stand x 10 reps with feet shoulder width apart on airex pad. first rep requiring CGA due to imbalance but improved stability noted iwth increased reps.      Ambulation/Gait   Ambulation/Gait Yes    Ambulation/Gait Assistance 5: Supervision;4: Min guard    Ambulation/Gait Assistance Details completed gait training with SPC with quad tip attachment x 230 ft,  patient demo good stability with AD and no imbalance noted. Patient demosntrating good carryover wtih sequencing. Paitent reporting that he felt comfortable with new AD. Progressed to completing ambulation over complaint surfaces 4 x 25' (blue/red mat) followed by negoitation around obstacles (cone) to further challenge balance with SPC with quad tip. More cautious gait noted on complaint surface but no significant imbalance noted. Intemrittent CGA/supervision throughout.    Ambulation Distance (Feet) 230 Feet   x 1, 25 x 4   Assistive device Straight cane    Gait Pattern Decreased stance time - left;Decreased hip/knee flexion - left;Decreased weight shift to left;Narrow base of support;Step-through pattern;Decreased arm swing - left    Ambulation Surface Level;Indoor      Neuro Re-ed    Neuro Re-ed Details  At countertop without UE support working toward improved stepping strategy completing anterior/posteiror stepping with RLE to promote improved stance and stability on LLE. Completed x 10 reps each direction, verbal cues for increased step length with posterior stepping.            Balance Exercises - 08/31/20 0001      Balance Exercises: Standing   Standing Eyes Opened Head turns;Wide (BOA);Foam/compliant surface;Limitations    Standing Eyes Opened Limitations standing on  airex pad with feet shoulder width apart, completed horizontal/vertical head turns x 10 reps each direction wtih eyes open.    Standing Eyes Closed Wide (BOA);Foam/compliant surface;Solid surface;Head turns;Narrow base of support (BOS);3 reps;30 secs;Limitations    Standing Eyes Closed Limitations standing on airex with narrow BOS completed eyes closed 3 x 30 seconds, increased sway noted initially but demo improved with increased reps. On firm surface with vision removed and narrow BOS, completed horizontal/vertical head turns x 10 reps each.    Tandem Stance Eyes open;2 reps;Limitations    Tandem Stance Time standing with  tandem stance, completed horizontal head turns x 10 reps, altenrating foot position. CGA throughout.               PT Short Term Goals - 08/22/20 1645      PT SHORT TERM GOAL #1   Title Pt will undergo further assessment of BERG with LTG written as appropriate. ALL STGS DUE 09/19/20.    Baseline 45/56 on 08/22/20, LTG revised    Time 5   due to delay in scheduling   Period Weeks    Status Achieved    Target Date 09/19/20      PT SHORT TERM GOAL #2   Title Pt will perform TUG in 20 seconds or less with SBQC in order to demo decr fall risk.    Baseline 23.91 seconds    Time 5    Period Weeks    Status New      PT SHORT TERM GOAL #3   Title Pt will improve gait speed with SBQC to at least 1.6 ft/sec in order to demo decr fall risk.    Baseline 1.35 ft/sec    Time 5    Period Weeks    Status New      PT SHORT TERM GOAL #4   Title Pt will perform 4 steps with use of railing and or SBQC with supervision in order to demo improved community access.    Time 5    Period Weeks    Status New      PT SHORT TERM GOAL #5   Title Pt will perform 5x sit <> stand in 20 seconds or less with no UE support from chair in order to demo decr fall risk.    Baseline 22.85 seconds    Time 5    Period Weeks    Status New             PT Long Term Goals - 08/22/20 1646      PT LONG TERM GOAL #1   Title Pt and pt's spouse will be independent with final HEP in order to build upon functional gains made in therapy. ALL LTGS DUE 10/17/20    Time 9   due to delay in scheduling   Period Weeks    Status New      PT LONG TERM GOAL #2   Title Pt will ambulate 41' with no AD vs. LRAD over level indoor surfaces with supervision in order to demo improved household mobility.    Time 9    Period Weeks    Status New      PT LONG TERM GOAL #3   Title Pt will perform TUG in 18 seconds or less with LRAD in order to demo decr fall risk.    Baseline 23.91 seconds    Time 9    Period Weeks    Status New       PT LONG  TERM GOAL #4   Title Pt will perform 5x sit <> stand in 17 seconds or less with no UE support from chair in order to demo decr fall risk.    Baseline 22.85 seconds    Time 9    Period Weeks    Status New      PT LONG TERM GOAL #5   Title Pt will improve BERG to at least a 48/56 in order to demo decr fall risk.    Baseline 45/56    Time 9    Period Weeks    Status Revised                 Plan - 08/31/20 1538    Clinical Impression Statement Initiated gait training with [place] with quad tip attachment today, with patient doing well with no significant change in stability noted with LRAD. Continued training with AD on complaint and firm surfaces with patient tolerating well. Continued rest of session working toward improved balance on complatint surfaces and improved stepping strategy. Will continue to progress toward LTGs.    Personal Factors and Comorbidities Comorbidity 1;Past/Current Experience;Profession    Comorbidities HTN    Examination-Activity Limitations Stand;Stairs;Squat;Transfers;Locomotion Level    Examination-Participation Restrictions Community Activity;Occupation   walking his dog   Stability/Clinical Decision Making Stable/Uncomplicated    Rehab Potential Good    PT Frequency 1x / week   1-2   PT Duration 8 weeks    PT Treatment/Interventions ADLs/Self Care Home Management;Aquatic Therapy;Electrical Stimulation;DME Instruction;Gait training;Stair training;Functional mobility training;Therapeutic activities;Therapeutic exercise;Balance training;Neuromuscular re-education;Wheelchair mobility training;Orthotic Fit/Training;Patient/family education;Passive range of motion;Energy conservation;Vestibular    PT Next Visit Plan add to HEP as appropriate. Continue gait with SPC with quad tip, potentially do outdoor surfaces? balance strategies on different surfaces/decr UE support, NMR/strengthening for LLE.    PT Home Exercise Plan [id number]    Consulted and  Agree with Plan of Care Patient           Patient will benefit from skilled therapeutic intervention in order to improve the following deficits and impairments:  Abnormal gait,Decreased activity tolerance,Decreased balance,Decreased mobility,Decreased knowledge of use of DME,Decreased coordination,Decreased range of motion,Decreased strength,Difficulty walking,Impaired tone,Impaired sensation,Postural dysfunction  Visit Diagnosis: Hemiplegia and hemiparesis following cerebral infarction affecting left non-dominant side (HCC)  Muscle weakness (generalized)  Unsteadiness on feet  Other abnormalities of gait and mobility     Problem List Patient Active Problem List   Diagnosis Date Noted  . History of CVA (cerebrovascular accident) 01/19/2020  . Normocytic anemia   . Benign essential HTN   . Hyponatremia   . Acute blood loss anemia   . Hypotension due to drugs   . Vascular headache   . Prerenal azotemia   . Sleep disturbance   . Leukocytosis   . Transaminitis   . Marijuana abuse   . Essential hypertension   . Dysphagia, post-stroke   . Left hemiparesis ([place])   . Acute respiratory failure ([place])   . ICH (intracerebral hemorrhage) ([place]) 12/06/2019    Jones Bales, PT, DPT 08/31/2020, 3:43 PM  [facility] [address] [place], [place], [address] Phone: [phone]   Fax:  [phone]  Name: Dennis Mercado MRN: [id number] Date of Birth: 1981/05/15

## 2020-09-05 ENCOUNTER — Encounter: Payer: Self-pay | Admitting: Occupational Therapy

## 2020-09-05 ENCOUNTER — Encounter
Payer: BC Managed Care – PPO | Attending: Physical Medicine & Rehabilitation | Admitting: Physical Medicine & Rehabilitation

## 2020-09-05 ENCOUNTER — Encounter: Payer: Self-pay | Admitting: Physical Medicine & Rehabilitation

## 2020-09-05 ENCOUNTER — Ambulatory Visit: Payer: Managed Care, Other (non HMO) | Admitting: Occupational Therapy

## 2020-09-05 ENCOUNTER — Other Ambulatory Visit: Payer: Self-pay

## 2020-09-05 VITALS — BP 128/88 | HR 95 | Temp 99.3°F | Ht 67.0 in | Wt 149.0 lb

## 2020-09-05 DIAGNOSIS — R29818 Other symptoms and signs involving the nervous system: Secondary | ICD-10-CM

## 2020-09-05 DIAGNOSIS — R208 Other disturbances of skin sensation: Secondary | ICD-10-CM

## 2020-09-05 DIAGNOSIS — M6281 Muscle weakness (generalized): Secondary | ICD-10-CM

## 2020-09-05 DIAGNOSIS — G8114 Spastic hemiplegia affecting left nondominant side: Secondary | ICD-10-CM

## 2020-09-05 DIAGNOSIS — R41842 Visuospatial deficit: Secondary | ICD-10-CM

## 2020-09-05 DIAGNOSIS — R2681 Unsteadiness on feet: Secondary | ICD-10-CM

## 2020-09-05 DIAGNOSIS — I69354 Hemiplegia and hemiparesis following cerebral infarction affecting left non-dominant side: Secondary | ICD-10-CM

## 2020-09-05 NOTE — Therapy (Signed)
[facility] [address] [place], [place], [address] Phone: [phone]   Fax:  [phone]  Occupational Therapy Treatment  Patient Details  Name: Dennis Mercado MRN: [id number] Date of Birth: 04/29/1981 Referring Provider (OT): Dr. Letta Pate   Encounter Date: 09/05/2020   OT End of Session - 09/05/20 1406    Visit Number 6    Number of Visits 16    Date for OT Re-Evaluation 09/23/20    Authorization - Visit Number 6    Authorization - Number of Visits 16    OT Start Time 8828    OT Stop Time 1403    OT Time Calculation (min) 48 min    Activity Tolerance Patient tolerated treatment well    Behavior During Therapy [facility] for tasks assessed/performed           History reviewed. No pertinent past medical history.  Past Surgical History:  Procedure Laterality Date  . WISDOM TOOTH EXTRACTION      There were no vitals filed for this visit.   Subjective Assessment - 09/05/20 1322    Subjective  I have an appointment with Dr Read Drivers this afternoon.  Patient wanats to talk with HR and management about returning to work - some confusion regarding restrictions versus recommendations    Pertinent History Pt. presented to ED on 12/06/2019 with left side weakness and  altered mental status s/p coitus.  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 Pt required intubation 6/22 due to ARF secondary to aspiration PNA. Received inpatient rehab. Pt was discharged 7/28/2    Limitations HTN, left inattention, apraxia    Patient Stated Goals to be able to use my hand    Currently in Pain? No/denies    Pain Score 0-No pain                        OT Treatments/Exercises (OP) - 09/05/20 0001      Neurological Re-education Exercises   Other Exercises 1 Neuromuscular reeducation to address ability to isolate glenohumeral joint motion  with body on arm motion in sidelying.  Patient now able to lie on his left side.  Worked to on isolated [place] joint motion in supine, then in sidelying to address elbow extesnion, Shoulder external rotation, forearm supination, wrist extension, and release from grasp.                  OT Education - 09/05/20 1406    Education Details sidelying at edge of bed to stretch left elbow/wrist    Person(s) Educated Patient    Methods Explanation;Demonstration    Comprehension Verbalized understanding;Need further instruction;Returned demonstration            OT Short Term Goals - 09/05/20 1512      OT SHORT TERM GOAL #1   Title I with inital HEP    Time 4    Period Weeks    Status On-going      OT SHORT TERM GOAL #2   Title Patient will demonstrate low reach to 30 degrees of shoulder flexion with min assist    Time 4    Period Weeks    Status On-going      OT SHORT TERM GOAL #3   Title Patient will demonstrate 30 degrees of active assited wrist and forearm motion    Time 4    Period Weeks  Status On-going      OT SHORT TERM GOAL #4   Title xxx      OT SHORT TERM GOAL #5   Title xxx             OT Long Term Goals - 09/05/20 1512      OT LONG TERM GOAL #1   Title I with aquatic HEP    Time 8    Period Weeks    Status On-going      OT LONG TERM GOAL #2   Title Patient will demonstrate low reach to 45 degrees of shoulder flexion with elbow extension    Time 8    Period Weeks    Status On-going      OT LONG TERM GOAL #3   Title Patient will demonstrate no pain with passive range of motion to 120 shoulder flexion/abduction    Time 8    Period Weeks    Status On-going      OT LONG TERM GOAL #4   Title xxx      OT LONG TERM GOAL #5   Title xxx      OT LONG TERM GOAL #6   Title xxx                 Plan - 09/05/20 1511    Clinical Impression Statement Patienthas returned to driving, and is trying to return to work as well.    OT Occupational  Profile and History Detailed Assessment- Review of Records and additional review of physical, cognitive, psychosocial history related to current functional performance    Occupational performance deficits (Please refer to evaluation for details): ADL's;IADL's;Leisure;Social Participation;Work;Play    Body Structure / Function / Physical Skills ADL;Balance;Endurance;Mobility;Strength;UE functional use;FMC;Vision;Coordination;Gait;Decreased knowledge of precautions;GMC;ROM;Decreased knowledge of use of DME;Dexterity;IADL;Sensation;Tone;Body Banker;Safety Awareness;Thought;Understand    Rehab Potential Good    Clinical Decision Making Limited treatment options, no task modification necessary    Comorbidities Affecting Occupational Performance: May have comorbidities impacting occupational performance    Modification or Assistance to Complete Evaluation  No modification of tasks or assist necessary to complete eval    OT Frequency 2x / week    OT Duration 8 weeks    OT Treatment/Interventions Self-care/ADL training;Therapeutic exercise;Functional Mobility Training;Balance training;Splinting;Manual Therapy;Neuromuscular education;Aquatic Therapy;Ultrasound;Energy conservation;Therapeutic activities;Cryotherapy;Paraffin;DME and/or AE instruction;Cognitive remediation/compensation;Visual/perceptual remediation/compensation;Gait Training;Fluidtherapy;Electrical Stimulation;Moist Heat;Contrast Bath;Passive range of motion;Patient/family education    Plan aquatic therapy and clinic therapy to address spasticity, need left trunk lengthening/right trunk shortening, realignment of shoulder girdle, and isolated glenohumeral joint motion    Consulted and Agree with Plan of Care Patient;Family member/caregiver    Family Member Consulted wife April           Patient will benefit from skilled therapeutic intervention in order to improve the following deficits  and impairments:   Body Structure / Function / Physical Skills: ADL,Balance,Endurance,Mobility,Strength,UE functional use,FMC,Vision,Coordination,Gait,Decreased knowledge of precautions,GMC,ROM,Decreased knowledge of use of DME,Dexterity,IADL,Sensation,Tone,Body mechanics Cognitive Skills: Attention,Memory,Problem Solve,Safety Awareness,Thought,Understand     Visit Diagnosis: Hemiplegia and hemiparesis following cerebral infarction affecting left non-dominant side (HCC)  Muscle weakness (generalized)  Unsteadiness on feet  Other disturbances of skin sensation  Other symptoms and signs involving the nervous system  Visuospatial deficit    Problem List Patient Active Problem List   Diagnosis Date Noted  . History of CVA (cerebrovascular accident) 01/19/2020  . Normocytic anemia   . Benign essential HTN   . Hyponatremia   . Acute blood loss anemia   .  Hypotension due to drugs   . Vascular headache   . Prerenal azotemia   . Sleep disturbance   . Leukocytosis   . Transaminitis   . Marijuana abuse   . Essential hypertension   . Dysphagia, post-stroke   . Left hemiparesis ([place])   . Acute respiratory failure ([place])   . ICH (intracerebral hemorrhage) ([place]) 12/06/2019    Mariah Milling, OTR/L 09/05/2020, 3:13 PM  [facility] [address] [place], [place], [address] Phone: [phone]   Fax:  [phone]  Name: Duward Allbritton MRN: [id number] Date of Birth: September 04, 1980

## 2020-09-05 NOTE — Progress Notes (Signed)
Subjective:    Patient ID: Dennis Mercado, male    DOB: [birth year], 40 y.o.   MRN: [id number] 40 y.o. right-handed male with documented history of hypertension on no current antihypertensive medications.  History taken from chart and wife due to cognition.  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  1 level home one-step to entry.  Presented 12/06/2019 the left side weakness altered mental status during sexual activity.  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  Per report measuring 3.6 x 2.8 x 2.6 cm.  Hemorrhage extending into the ventricles and could be seen in the third and fourth ventricles without hydrocephalus.  Local mass-effect without midline shift.  CT angiogram of head and neck normal variant.  CTA circle of Willis without significant proximal stenosis aneurysm or branch vessel occlusion.  Admission chemistries potassium 3.2 creatinine 1.52 urine drug screen positive marijuana.  Echocardiogram with ejection fraction of 65% no wall motion abnormalities.  EEG negative for seizure.  Carotid Dopplers unremarkable.  Venous Doppler studies lower extremities negative for DVT.  Follow-up cranial CT scan stable.  Unchanged size of right thalamic intraparenchymal hematoma with mild surrounding edema.  Hospital course complicated by fevers presumed to be secondary to large left lung opacity concerning for pneumonia he completed a course of Unasyn.  Tolerating a dysphagia #2 thin liquid diet.  Cleviprex initially ongoing for blood pressure control.  He was cleared to begin subcutaneous heparin for DVT prophylaxis 12/11/2019.  Admit date: 12/15/2019 Discharge date: 01/12/2020 HPI Pt restarted PT, OT on an outpatient basis Denied going back to work by employer due to restrictions, patient indicates that the work restrictions applied to how many hours a day total of each activity such as standing or sitting.  Patient indicates that he stands for about an hour  at a time when he cooks meals at home.  In addition he notes that his work activities include standing for about 30 minutes at a time followed by sitting alternating about 4 5 times per day No falls, remains independent with all self-care and mobility  Pain Inventory Average Pain 0 Pain Right Now 0 My pain is no pain  LOCATION OF PAIN  No pain   BOWEL Number of stools per week: 7 Oral laxative use No  Type of laxative n/a Enema or suppository use No  History of colostomy No  Incontinent No   BLADDER Normal In and out cath, frequency n/a Able to self cath n/a Bladder incontinence No  Frequent urination No  Leakage with coughing No  Difficulty starting stream No  Incomplete bladder emptying No    Mobility walk with assistance use a cane  Function disabled: date disabled 12/06/2019  Neuro/Psych trouble walking  Prior Studies Any changes since last visit?  no  Physicians involved in your care Any changes since last visit?  no   History reviewed. No pertinent family history. Social History   Socioeconomic History  . Marital status: Married    Spouse name: Not on file  . Number of children: Not on file  . Years of education: Not on file  . Highest education level: Not on file  Occupational History  . Not on file  Tobacco Use  . Smoking status: Never Smoker  . Smokeless tobacco: Never Used  Vaping Use  . Vaping Use: Never used  Substance and Sexual Activity  . Alcohol use: Not Currently    Comment: several drinks over the weekend  . Drug use: Never  .  Sexual activity: Not on file  Other Topics Concern  . Not on file  Social History Narrative  . Not on file   Social Determinants of Health   Financial Resource Strain: Not on file  Food Insecurity: Not on file  Transportation Needs: Not on file  Physical Activity: Not on file  Stress: Not on file  Social Connections: Not on file   Past Surgical History:  Procedure Laterality Date  . WISDOM  TOOTH EXTRACTION     History reviewed. No pertinent past medical history. There were no vitals taken for this visit.  Opioid Risk Score:   Fall Risk Score:  `1  Depression screen PHQ 2/9  Depression screen [facility] 2/9 08/11/2020 05/18/2020 04/18/2020 01/25/2020  Decreased Interest 0 0 0 0  Down, Depressed, Hopeless 0 0 0 0  PHQ - 2 Score 0 0 0 0  Altered sleeping - - - 0  Tired, decreased energy - - - 0  Change in appetite - - - 0  Feeling bad or failure about yourself  - - - 0  Trouble concentrating - - - 0  Moving slowly or fidgety/restless - - - 0  Suicidal thoughts - - - 0  PHQ-9 Score - - - 0    Review of Systems  Constitutional: Negative.   HENT: Negative.   Eyes: Negative.   Respiratory: Negative.   Cardiovascular: Negative.   Gastrointestinal: Negative.   Endocrine: Negative.   Genitourinary: Negative.   Musculoskeletal: Positive for gait problem.  Skin: Negative.   Allergic/Immunologic: Negative.   Hematological: Negative.   Psychiatric/Behavioral: Negative.   All other systems reviewed and are negative.      Objective:   Physical Exam Vitals and nursing note reviewed.  HENT:     Head: Normocephalic and atraumatic.  Eyes:     Extraocular Movements: Extraocular movements intact.     Conjunctiva/sclera: Conjunctivae normal.     Pupils: Pupils are equal, round, and reactive to light.  Musculoskeletal:     Comments: No pain with upper limb range of motion ambulates with cane  Skin:    General: Skin is warm and dry.  Neurological:     Mental Status: He is alert and oriented to person, place, and time.     Motor: Weakness present.     Gait: Gait abnormal.     Comments:  Tone MAS 2 at the elbow flexors MAS 0 at the finger flexors MAS 0 at the wrist flexors  Psychiatric:        Mood and Affect: Mood normal.        Behavior: Behavior normal.           Assessment & Plan:  #1.  Left spastic hemiparesis doing well post Dysport injection may actually be  able to reduce dose to 500 units  #2.  Vocational issues would like to clarify on another form patient will forward the appropriate paperwork to this office. In addition patient's functional abilities may improve after he finishes therapy in May.  Could reassess and ask therapy to do some work simulation with the patient

## 2020-09-06 ENCOUNTER — Encounter: Payer: Self-pay | Admitting: Physical Therapy

## 2020-09-06 ENCOUNTER — Ambulatory Visit: Payer: Managed Care, Other (non HMO) | Admitting: Physical Therapy

## 2020-09-06 DIAGNOSIS — I69354 Hemiplegia and hemiparesis following cerebral infarction affecting left non-dominant side: Secondary | ICD-10-CM | POA: Diagnosis not present

## 2020-09-06 DIAGNOSIS — R2681 Unsteadiness on feet: Secondary | ICD-10-CM

## 2020-09-06 DIAGNOSIS — M6281 Muscle weakness (generalized): Secondary | ICD-10-CM

## 2020-09-06 DIAGNOSIS — R29818 Other symptoms and signs involving the nervous system: Secondary | ICD-10-CM

## 2020-09-07 NOTE — Therapy (Signed)
[facility] [address] [place], [place], [address] Phone: [phone]   Fax:  [phone]  Physical Therapy Treatment  Patient Details  Name: Dennis Mercado MRN: [id number] Date of Birth: [birth year] Referring Provider (PT): [name], MD   Encounter Date: 09/06/2020   PT End of Session - 09/07/20 0955    Visit Number 4    Number of Visits 17   visits dependent on pt's co pay, written for 1-2x week for 8 weeks   Authorization Type VL: 60 visits PT, 30 OT    Authorization - Visit Number 3    Authorization - Number of Visits 40    PT Start Time 3875   pt arrived late   PT Stop Time 1445    PT Time Calculation (min) 37 min    Equipment Utilized During Treatment Gait belt    Activity Tolerance Patient tolerated treatment well    Behavior During Therapy [facility] for tasks assessed/performed           History reviewed. No pertinent past medical history.  Past Surgical History:  Procedure Laterality Date  . WISDOM TOOTH EXTRACTION      There were no vitals filed for this visit.   Subjective Assessment - 09/06/20 1410    Subjective No changes. Waiting to figure out about going back to work.    Pertinent History HTN    Limitations Walking    Patient Stated Goals wants to work on not walking with the cane    Currently in Pain? No/denies                             [facility] Adult PT Treatment/Exercise - 09/06/20 1440      Ambulation/Gait   Ambulation/Gait Yes    Ambulation/Gait Assistance 5: Supervision;4: Min guard    Ambulation/Gait Assistance Details gait with SPC with 4 prong tip x2 laps in therapy gym. then performed gait over blue/red mats for a compliant surface (slowed speed) and stepping over 3 2" obstacles, cued for proper sequencing with cane and stepping over with LLE first in order to make sure pt clears obstacle    Ambulation Distance (Feet) 230 Feet    Assistive device  Straight cane   wiht 4 prong tip   Gait Pattern Decreased stance time - left;Decreased hip/knee flexion - left;Decreased weight shift to left;Narrow base of support;Step-through pattern;Decreased arm swing - left    Ambulation Surface Level;Indoor    Ramp Other (comment)   min guard with SPC with quad tip, x3 reps up and down, slowed gait speed while descending   Curb --   min guard with SPC with quad tip, x3 reps                09/06/20 0001  Balance Exercises: Standing  Rockerboard Anterior/posterior;Limitations;EO  Rockerboard Limitations keeping board steady; x10 reps head nods, x10 reps head turns - cues for quad activation with LLE.stepping off board backwards with RLE and then back on for incr SLS time on LLE x15 reps (progressing to single UE > fingertip > no UE support with  min guard/min A for balance, then stepping RLE forwards to 8" step while balancing on LLE x15 reps (progressing to no UE support), pt needing cues for weight shifting towards LLE and standing tall  Other Standing Exercises Comments sit <> stands standing on red balance beam x3 reps with single UE support to stand, decr  eccentric control when sitting - pt reporting feeling incr fatigue with this activity, performed at end of session        PT Short Term Goals - 08/22/20 1645      PT SHORT TERM GOAL #1   Title Pt will undergo further assessment of BERG with LTG written as appropriate. ALL STGS DUE 09/19/20.    Baseline 45/56 on 08/22/20, LTG revised    Time 5   due to delay in scheduling   Period Weeks    Status Achieved    Target Date 09/19/20      PT SHORT TERM GOAL #2   Title Pt will perform TUG in 20 seconds or less with SBQC in order to demo decr fall risk.    Baseline 23.91 seconds    Time 5    Period Weeks    Status New      PT SHORT TERM GOAL #3   Title Pt will improve gait speed with SBQC to at least 1.6 ft/sec in order to demo decr fall risk.    Baseline 1.35 ft/sec    Time 5    Period  Weeks    Status New      PT SHORT TERM GOAL #4   Title Pt will perform 4 steps with use of railing and or SBQC with supervision in order to demo improved community access.    Time 5    Period Weeks    Status New      PT SHORT TERM GOAL #5   Title Pt will perform 5x sit <> stand in 20 seconds or less with no UE support from chair in order to demo decr fall risk.    Baseline 22.85 seconds    Time 5    Period Weeks    Status New             PT Long Term Goals - 08/22/20 1646      PT LONG TERM GOAL #1   Title Pt and pt's spouse will be independent with final HEP in order to build upon functional gains made in therapy. ALL LTGS DUE 10/17/20    Time 9   due to delay in scheduling   Period Weeks    Status New      PT LONG TERM GOAL #2   Title Pt will ambulate 48' with no AD vs. LRAD over level indoor surfaces with supervision in order to demo improved household mobility.    Time 9    Period Weeks    Status New      PT LONG TERM GOAL #3   Title Pt will perform TUG in 18 seconds or less with LRAD in order to demo decr fall risk.    Baseline 23.91 seconds    Time 9    Period Weeks    Status New      PT LONG TERM GOAL #4   Title Pt will perform 5x sit <> stand in 17 seconds or less with no UE support from chair in order to demo decr fall risk.    Baseline 22.85 seconds    Time 9    Period Weeks    Status New      PT LONG TERM GOAL #5   Title Pt will improve BERG to at least a 48/56 in order to demo decr fall risk.    Baseline 45/56    Time 9    Period Weeks    Status Revised  Plan - 09/07/20 1002    Clinical Impression Statement Weather was rainy today - unable to practice gait outdoors with [facility] with quad tip. Continued to practice indoors over compliant surfaces, obstacle training, and curbs/inclines. Pt did well, needing supervision/min guard and demonstrating slower gait speed up/down incline and on compliant surfaces but no LOB. Pt fatigued  after progressing SLS activities on rockerboard with no UE support. Will continue to progress towards LTG.s    Personal Factors and Comorbidities Comorbidity 1;Past/Current Experience;Profession    Comorbidities HTN    Examination-Activity Limitations Stand;Stairs;Squat;Transfers;Locomotion Level    Examination-Participation Restrictions Community Activity;Occupation   walking his dog   Stability/Clinical Decision Making Stable/Uncomplicated    Rehab Potential Good    PT Frequency 1x / week   1-2   PT Duration 8 weeks    PT Treatment/Interventions ADLs/Self Care Home Management;Aquatic Therapy;Electrical Stimulation;DME Instruction;Gait training;Stair training;Functional mobility training;Therapeutic activities;Therapeutic exercise;Balance training;Neuromuscular re-education;Wheelchair mobility training;Orthotic Fit/Training;Patient/family education;Passive range of motion;Energy conservation;Vestibular    PT Next Visit Plan add to HEP as appropriate. Continue gait with SPC with quad tip- do outside if weather is nice! pt might bring in other shoes to show how his walking would look without an AFO (states he stil needs to use one, but would want to see how it looks) balance strategies on different surfaces/decr UE support, NMR/strengthening for LLE.    PT Home Exercise Plan [id number]    Consulted and Agree with Plan of Care Patient           Patient will benefit from skilled therapeutic intervention in order to improve the following deficits and impairments:  Abnormal gait,Decreased activity tolerance,Decreased balance,Decreased mobility,Decreased knowledge of use of DME,Decreased coordination,Decreased range of motion,Decreased strength,Difficulty walking,Impaired tone,Impaired sensation,Postural dysfunction  Visit Diagnosis: Muscle weakness (generalized)  Hemiplegia and hemiparesis following cerebral infarction affecting left non-dominant side (HCC)  Unsteadiness on feet  Other symptoms  and signs involving the nervous system     Problem List Patient Active Problem List   Diagnosis Date Noted  . History of CVA (cerebrovascular accident) 01/19/2020  . Normocytic anemia   . Benign essential HTN   . Hyponatremia   . Acute blood loss anemia   . Hypotension due to drugs   . Vascular headache   . Prerenal azotemia   . Sleep disturbance   . Leukocytosis   . Transaminitis   . Marijuana abuse   . Essential hypertension   . Dysphagia, post-stroke   . Left hemiparesis ([place])   . Acute respiratory failure ([place])   . ICH (intracerebral hemorrhage) ([place]) 12/06/2019    Arliss Journey, PT, DPT  09/07/2020, 10:05 AM  [facility] [address] [place], [place], [address] Phone: [phone]   Fax:  [phone]  Name: Quinterius Gaida MRN: [id number] Date of Birth: 11-Nov-1980

## 2020-09-11 ENCOUNTER — Ambulatory Visit: Payer: Managed Care, Other (non HMO) | Admitting: Occupational Therapy

## 2020-09-11 ENCOUNTER — Encounter: Payer: Self-pay | Admitting: Occupational Therapy

## 2020-09-11 DIAGNOSIS — I69354 Hemiplegia and hemiparesis following cerebral infarction affecting left non-dominant side: Secondary | ICD-10-CM | POA: Diagnosis not present

## 2020-09-11 NOTE — Therapy (Signed)
[facility] [address] [place], [place], [address] Phone: [phone]   Fax:  [phone]  Occupational Therapy Treatment  Patient Details  Name: Dennis Mercado MRN: [id number] Date of Birth: 1980/12/07 Referring Provider (OT): Dr. Letta Pate   Encounter Date: 09/11/2020   OT End of Session - 09/11/20 1830    Visit Number 7    Number of Visits 16    Date for OT Re-Evaluation 09/23/20    Authorization - Visit Number 7    Authorization - Number of Visits 16    OT Start Time 7169    OT Stop Time 1500    OT Time Calculation (min) 45 min    Activity Tolerance Patient tolerated treatment well    Behavior During Therapy [facility] for tasks assessed/performed           History reviewed. No pertinent past medical history.  Past Surgical History:  Procedure Laterality Date  . WISDOM TOOTH EXTRACTION      There were no vitals filed for this visit.   Subjective Assessment - 09/11/20 1825    Subjective  Patient trying to obtain documentation to help him return to work.    Patient is accompanied by: Family member    Pertinent History Pt. presented to ED on 12/06/2019 with left side weakness and  altered mental status s/p coitus.  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 Pt required intubation 6/22 due to ARF secondary to aspiration PNA. Received inpatient rehab. Pt was discharged 7/28/2    Limitations HTN, left inattention, apraxia    Patient Stated Goals to be able to use my hand    Currently in Pain? No/denies    Pain Score 0-No pain            Patient seen for aquatic therapy this afternoon.  Patient entered and exited the pool via stairs and min assist, using a step through pattern on exit.  Emphasis continues to be moving forward over left foot in walking, and allowing adequate hip extension for left leg behind body for more  equal step length.  Postural alignment and control as related to functional mobility as well as for glenohumeral joint motion.  Patient with improved wrist and digit range of motion actively today.                        OT Short Term Goals - 09/11/20 1835      OT SHORT TERM GOAL #1   Title I with inital HEP    Time 4    Period Weeks    Status Achieved      OT SHORT TERM GOAL #2   Title Patient will demonstrate low reach to 30 degrees of shoulder flexion with min assist    Time 4    Period Weeks    Status On-going      OT SHORT TERM GOAL #3   Title Patient will demonstrate 30 degrees of active assited wrist and forearm motion    Time 4    Period Weeks    Status On-going      OT SHORT TERM GOAL #4   Title xxx      OT SHORT TERM GOAL #5   Title xxx             OT Long Term Goals - 09/05/20 1512      OT LONG TERM  GOAL #1   Title I with aquatic HEP    Time 8    Period Weeks    Status On-going      OT LONG TERM GOAL #2   Title Patient will demonstrate low reach to 45 degrees of shoulder flexion with elbow extension    Time 8    Period Weeks    Status On-going      OT LONG TERM GOAL #3   Title Patient will demonstrate no pain with passive range of motion to 120 shoulder flexion/abduction    Time 8    Period Weeks    Status On-going      OT LONG TERM GOAL #4   Title xxx      OT LONG TERM GOAL #5   Title xxx      OT LONG TERM GOAL #6   Title xxx                 Plan - 09/11/20 1831    Clinical Impression Statement Patient continues to show steady improvement in functional mobility and has regained some functional use of LUE    OT Occupational Profile and History Detailed Assessment- Review of Records and additional review of physical, cognitive, psychosocial history related to current functional performance    Occupational performance deficits (Please refer to evaluation for details): ADL's;IADL's;Leisure;Social  Participation;Work;Play    Body Structure / Function / Physical Skills ADL;Balance;Endurance;Mobility;Strength;UE functional use;FMC;Vision;Coordination;Gait;Decreased knowledge of precautions;GMC;ROM;Decreased knowledge of use of DME;Dexterity;IADL;Sensation;Tone;Body Banker;Safety Awareness;Thought;Understand    Rehab Potential Good    Clinical Decision Making Limited treatment options, no task modification necessary    Comorbidities Affecting Occupational Performance: May have comorbidities impacting occupational performance    Modification or Assistance to Complete Evaluation  No modification of tasks or assist necessary to complete eval    OT Frequency 2x / week    OT Duration 8 weeks    OT Treatment/Interventions Self-care/ADL training;Therapeutic exercise;Functional Mobility Training;Balance training;Splinting;Manual Therapy;Neuromuscular education;Aquatic Therapy;Ultrasound;Energy conservation;Therapeutic activities;Cryotherapy;Paraffin;DME and/or AE instruction;Cognitive remediation/compensation;Visual/perceptual remediation/compensation;Gait Training;Fluidtherapy;Electrical Stimulation;Moist Heat;Contrast Bath;Passive range of motion;Patient/family education    Plan aquatic therapy and clinic therapy to address spasticity, need left trunk lengthening/right trunk shortening, realignment of shoulder girdle, and isolated glenohumeral joint motion    Consulted and Agree with Plan of Care Patient;Family member/caregiver    Family Member Consulted wife April           Patient will benefit from skilled therapeutic intervention in order to improve the following deficits and impairments:   Body Structure / Function / Physical Skills: ADL,Balance,Endurance,Mobility,Strength,UE functional use,FMC,Vision,Coordination,Gait,Decreased knowledge of precautions,GMC,ROM,Decreased knowledge of use of DME,Dexterity,IADL,Sensation,Tone,Body  mechanics Cognitive Skills: Attention,Memory,Problem Solve,Safety Awareness,Thought,Understand     Visit Diagnosis: Muscle weakness (generalized)  Hemiplegia and hemiparesis following cerebral infarction affecting left non-dominant side (HCC)  Unsteadiness on feet  Other symptoms and signs involving the nervous system  Other disturbances of skin sensation  Visuospatial deficit    Problem List Patient Active Problem List   Diagnosis Date Noted  . History of CVA (cerebrovascular accident) 01/19/2020  . Normocytic anemia   . Benign essential HTN   . Hyponatremia   . Acute blood loss anemia   . Hypotension due to drugs   . Vascular headache   . Prerenal azotemia   . Sleep disturbance   . Leukocytosis   . Transaminitis   . Marijuana abuse   . Essential hypertension   . Dysphagia, post-stroke   . Left hemiparesis ([place])   .  Acute respiratory failure ([place])   . ICH (intracerebral hemorrhage) ([place]) 12/06/2019    Mariah Milling, OTR/L 09/11/2020, 6:36 PM  [facility] [address] [place], [place], [address] Phone: [phone]   Fax:  [phone]  Name: Dennis Mercado MRN: [id number] Date of Birth: [birth year]

## 2020-09-14 ENCOUNTER — Other Ambulatory Visit: Payer: Self-pay

## 2020-09-14 ENCOUNTER — Ambulatory Visit: Payer: Managed Care, Other (non HMO)

## 2020-09-14 DIAGNOSIS — M6281 Muscle weakness (generalized): Secondary | ICD-10-CM

## 2020-09-14 DIAGNOSIS — I69354 Hemiplegia and hemiparesis following cerebral infarction affecting left non-dominant side: Secondary | ICD-10-CM | POA: Diagnosis not present

## 2020-09-14 DIAGNOSIS — R2689 Other abnormalities of gait and mobility: Secondary | ICD-10-CM

## 2020-09-14 NOTE — Therapy (Signed)
[facility] [address] [place], [place], [address] Phone: [phone]   Fax:  [phone]  Physical Therapy Treatment  Patient Details  Name: Dennis Mercado MRN: [id number] Date of Birth: [birth year] Referring Provider (PT): Letta Pate [name], MD   Encounter Date: 09/14/2020   PT End of Session - 09/14/20 1022    Visit Number 5    Number of Visits 17   visits dependent on pt's co pay, written for 1-2x week for 8 weeks   Authorization Type VL: 60 visits PT, 30 OT    Authorization - Visit Number 5    Authorization - Number of Visits 60    PT Start Time 1019    PT Stop Time 1100    PT Time Calculation (min) 41 min    Equipment Utilized During Treatment Gait belt    Activity Tolerance Patient tolerated treatment well    Behavior During Therapy [facility] for tasks assessed/performed           History reviewed. No pertinent past medical history.  Past Surgical History:  Procedure Laterality Date  . WISDOM TOOTH EXTRACTION      There were no vitals filed for this visit.   Subjective Assessment - 09/14/20 1022    Subjective Pt reports that he took the wedge out and feels he is doing well without it. He is wearing AFO but at home for short distances does not always use AFO.    Pertinent History HTN    Limitations Walking    Patient Stated Goals wants to work on not walking with the cane    Currently in Pain? No/denies                             [facility] Adult PT Treatment/Exercise - 09/14/20 1024      Transfers   Transfers Sit to Stand;Stand to Sit    Sit to Stand 5: Supervision    Stand to Sit 5: Supervision      Ambulation/Gait   Ambulation/Gait Yes    Ambulation/Gait Assistance 5: Supervision    Ambulation/Gait Assistance Details Pt was cued to increase left hip flexion to help with foot clearance and increase left stance time.    Ambulation Distance (Feet) 230 Feet    Assistive device  Straight cane   with quad tip and left AFO   Gait Pattern Step-through pattern;Decreased step length - left;Decreased stance time - left;Decreased hip/knee flexion - left;Decreased arm swing - left    Ambulation Surface Level;Indoor    Stairs Yes    Stairs Assistance 5: Supervision    Stair Management Technique Step to pattern;One rail Left    Number of Stairs 8      Neuro Re-ed    Neuro Re-ed Details  Gait weaving in and out of 5 cones and walking across red and blue mats. Pt did better when stepping on to mat with LLE so could be sure he cleared foot. Performed 3 laps. Step-ups on 6" step at bottom of steps with LLEx 10. Gait around gym 230' with green theraband resistance at pelvis to facilitate pelvic rotation and weight shift. Step-ups on rockerboard positioned ant/post x 6 with RLE up and then down using cane for support CGA.                    PT Short Term Goals - 08/22/20 1645      PT SHORT TERM  GOAL #1   Title Pt will undergo further assessment of BERG with LTG written as appropriate. ALL STGS DUE 09/19/20.    Baseline 45/56 on 08/22/20, LTG revised    Time 5   due to delay in scheduling   Period Weeks    Status Achieved    Target Date 09/19/20      PT SHORT TERM GOAL #2   Title Pt will perform TUG in 20 seconds or less with SBQC in order to demo decr fall risk.    Baseline 23.91 seconds    Time 5    Period Weeks    Status New      PT SHORT TERM GOAL #3   Title Pt will improve gait speed with SBQC to at least 1.6 ft/sec in order to demo decr fall risk.    Baseline 1.35 ft/sec    Time 5    Period Weeks    Status New      PT SHORT TERM GOAL #4   Title Pt will perform 4 steps with use of railing and or SBQC with supervision in order to demo improved community access.    Time 5    Period Weeks    Status New      PT SHORT TERM GOAL #5   Title Pt will perform 5x sit <> stand in 20 seconds or less with no UE support from chair in order to demo decr fall risk.     Baseline 22.85 seconds    Time 5    Period Weeks    Status New             PT Long Term Goals - 08/22/20 1646      PT LONG TERM GOAL #1   Title Pt and pt's spouse will be independent with final HEP in order to build upon functional gains made in therapy. ALL LTGS DUE 10/17/20    Time 9   due to delay in scheduling   Period Weeks    Status New      PT LONG TERM GOAL #2   Title Pt will ambulate 43' with no AD vs. LRAD over level indoor surfaces with supervision in order to demo improved household mobility.    Time 9    Period Weeks    Status New      PT LONG TERM GOAL #3   Title Pt will perform TUG in 18 seconds or less with LRAD in order to demo decr fall risk.    Baseline 23.91 seconds    Time 9    Period Weeks    Status New      PT LONG TERM GOAL #4   Title Pt will perform 5x sit <> stand in 17 seconds or less with no UE support from chair in order to demo decr fall risk.    Baseline 22.85 seconds    Time 9    Period Weeks    Status New      PT LONG TERM GOAL #5   Title Pt will improve BERG to at least a 48/56 in order to demo decr fall risk.    Baseline 45/56    Time 9    Period Weeks    Status Revised                 Plan - 09/14/20 1822    Clinical Impression Statement Pt was able to demonstrate improved left weight shift after NMR activities today with better left  foot clearance and less compensatory movements. Tolerated activities well with minimal breaks.    Personal Factors and Comorbidities Comorbidity 1;Past/Current Experience;Profession    Comorbidities HTN    Examination-Activity Limitations Stand;Stairs;Squat;Transfers;Locomotion Level    Examination-Participation Restrictions Community Activity;Occupation   walking his dog   Stability/Clinical Decision Making Stable/Uncomplicated    Rehab Potential Good    PT Frequency 1x / week   1-2   PT Duration 8 weeks    PT Treatment/Interventions ADLs/Self Care Home Management;Aquatic  Therapy;Electrical Stimulation;DME Instruction;Gait training;Stair training;Functional mobility training;Therapeutic activities;Therapeutic exercise;Balance training;Neuromuscular re-education;Wheelchair mobility training;Orthotic Fit/Training;Patient/family education;Passive range of motion;Energy conservation;Vestibular    PT Next Visit Plan Check STGs. add to HEP as appropriate. Continue gait with SPC with quad tip- do outside if weather is nice! pt might bring in other shoes to show how his walking would look without an AFO (states he stil needs to use one, but would want to see how it looks) balance strategies on different surfaces/decr UE support, NMR/strengthening for LLE. Try tall kneeling.    PT Home Exercise Plan [id number]    Consulted and Agree with Plan of Care Patient           Patient will benefit from skilled therapeutic intervention in order to improve the following deficits and impairments:  Abnormal gait,Decreased activity tolerance,Decreased balance,Decreased mobility,Decreased knowledge of use of DME,Decreased coordination,Decreased range of motion,Decreased strength,Difficulty walking,Impaired tone,Impaired sensation,Postural dysfunction  Visit Diagnosis: Other abnormalities of gait and mobility  Muscle weakness (generalized)     Problem List Patient Active Problem List   Diagnosis Date Noted  . History of CVA (cerebrovascular accident) 01/19/2020  . Normocytic anemia   . Benign essential HTN   . Hyponatremia   . Acute blood loss anemia   . Hypotension due to drugs   . Vascular headache   . Prerenal azotemia   . Sleep disturbance   . Leukocytosis   . Transaminitis   . Marijuana abuse   . Essential hypertension   . Dysphagia, post-stroke   . Left hemiparesis ([place])   . Acute respiratory failure ([place])   . ICH (intracerebral hemorrhage) ([place]) 12/06/2019    Electa Sniff, PT, DPT, NCS 09/14/2020, 6:26 PM  [facility] [address] [place], [place], [address] Phone: [phone]   Fax:  [phone]  Name: Salathiel Ferrara MRN: [id number] Date of Birth: [birth year]

## 2020-09-17 ENCOUNTER — Other Ambulatory Visit: Payer: Self-pay | Admitting: Family

## 2020-09-19 ENCOUNTER — Ambulatory Visit: Payer: Managed Care, Other (non HMO) | Attending: Physician Assistant

## 2020-09-19 ENCOUNTER — Encounter: Payer: Self-pay | Admitting: Occupational Therapy

## 2020-09-19 ENCOUNTER — Ambulatory Visit: Payer: Managed Care, Other (non HMO) | Admitting: Occupational Therapy

## 2020-09-19 ENCOUNTER — Other Ambulatory Visit: Payer: Self-pay

## 2020-09-19 DIAGNOSIS — R2681 Unsteadiness on feet: Secondary | ICD-10-CM

## 2020-09-19 DIAGNOSIS — R29818 Other symptoms and signs involving the nervous system: Secondary | ICD-10-CM | POA: Diagnosis present

## 2020-09-19 DIAGNOSIS — R2689 Other abnormalities of gait and mobility: Secondary | ICD-10-CM | POA: Insufficient documentation

## 2020-09-19 DIAGNOSIS — R208 Other disturbances of skin sensation: Secondary | ICD-10-CM

## 2020-09-19 DIAGNOSIS — M6281 Muscle weakness (generalized): Secondary | ICD-10-CM | POA: Diagnosis present

## 2020-09-19 DIAGNOSIS — I69354 Hemiplegia and hemiparesis following cerebral infarction affecting left non-dominant side: Secondary | ICD-10-CM

## 2020-09-19 NOTE — Therapy (Signed)
[facility] [address] [place], [place], [address] Phone: [phone]   Fax:  [phone]  Physical Therapy Treatment  Patient Details  Name: Dennis Mercado MRN: [id number] Date of Birth: 1981/05/13 Referring Provider (PT): Letta Pate [name], MD   Encounter Date: 09/19/2020   PT End of Session - 09/19/20 1345    Visit Number 6    Number of Visits 17   visits dependent on pt's co pay, written for 1-2x week for 8 weeks   Authorization Type VL: 60 visits PT, 30 OT    Authorization - Visit Number 5    Authorization - Number of Visits 60    PT Start Time 3888    PT Stop Time 1230    PT Time Calculation (min) 43 min    Equipment Utilized During Treatment Gait belt    Activity Tolerance Patient tolerated treatment well    Behavior During Therapy [facility] for tasks assessed/performed           History reviewed. No pertinent past medical history.  Past Surgical History:  Procedure Laterality Date  . WISDOM TOOTH EXTRACTION      There were no vitals filed for this visit.   Subjective Assessment - 09/19/20 1151    Subjective Patient reports no changes/complaints. Is wearing AFO. Has been hurrycane some in the house, brings the [facility]. No pain or falls to report.    Pertinent History HTN    Limitations Walking    Patient Stated Goals wants to work on not walking with the cane    Currently in Pain? No/denies                [facility] Adult PT Treatment/Exercise - 09/19/20 0001      Transfers   Transfers Sit to Stand;Stand to Sit    Sit to Stand 5: Supervision    Five time sit to stand comments  18.76 seconds with no UE support from mat table    Stand to Sit 5: Supervision      Ambulation/Gait   Ambulation/Gait Yes    Ambulation/Gait Assistance 5: Supervision    Ambulation/Gait Assistance Details Continue to use SPC with quad tip attachment throughout session, no issue noted x 200 ft. PT educating to bring in  hurrycane to next session.    Ambulation Distance (Feet) 200 Feet    Assistive device Straight cane   quad tip; Left AFO   Gait Pattern Step-through pattern;Decreased step length - left;Decreased stance time - left;Decreased hip/knee flexion - left;Decreased arm swing - left    Ambulation Surface Level;Indoor    Gait velocity 23.65 secs = 1.39 ft/sec    Stairs Yes    Stairs Assistance 5: Supervision    Stairs Assistance Details (indicate cue type and reason) patient able to complete x 8 stairs with single rail on R ascend/descend with supervision.    Stair Management Technique Step to pattern;One rail Right    Number of Stairs 8    Height of Stairs 6      Standardized Balance Assessment   Standardized Balance Assessment Timed Up and Go Test      Timed Up and Go Test   TUG Normal TUG    Normal TUG (seconds) 23.82   with SPC with quad tip              Balance Exercises - 09/19/20 0001      Balance Exercises: Standing   Rockerboard Anterior/posterior;Lateral;Head turns;EO;Intermittent UE support;Limitations  Rockerboard Limitations keeping board steady A/P: completed horizontal/vertical head turns x 10 reps each direction. intermittent touch A to bars. then compelted standing steady with PT providing anterior/posterior perturbation x 3 minutes, CGA as needed. progressed to board positioned laterally: completed horizontal/vertical head turns x 10 reps each direction. completed lateral perturbation throughout shoulders/hip x 3 minutes, intermittent touch A from // bars. increased challenege noted overall with board positoined laterally > A/P.             PT Education - 09/19/20 1229    Education Details educated on progress toward STGs; bring in hurrycane next visit    Person(s) Educated Patient    Methods Explanation    Comprehension Verbalized understanding            PT Short Term Goals - 09/19/20 1153      PT SHORT TERM GOAL #1   Title Pt will undergo further  assessment of BERG with LTG written as appropriate. ALL STGS DUE 09/19/20.    Baseline 45/56 on 08/22/20, LTG revised    Time 5   due to delay in scheduling   Period Weeks    Status Achieved    Target Date 09/19/20      PT SHORT TERM GOAL #2   Title Pt will perform TUG in 20 seconds or less with SBQC in order to demo decr fall risk.    Baseline 23.91 seconds; 23.82 secs with SPC with quad tip    Time 5    Period Weeks    Status Partially Met      PT SHORT TERM GOAL #3   Title Pt will improve gait speed with SBQC to at least 1.6 ft/sec in order to demo decr fall risk.    Baseline 1.35 ft/sec; 1.39 ft/sec with SPC with quad tip    Time 5    Period Weeks    Status Partially Met      PT SHORT TERM GOAL #4   Title Pt will perform 4 steps with use of railing and or SBQC with supervision in order to demo improved community access.    Baseline 8 stairs with single rail on R and supervision    Time 5    Period Weeks    Status Achieved      PT SHORT TERM GOAL #5   Title Pt will perform 5x sit <> stand in 20 seconds or less with no UE support from chair in order to demo decr fall risk.    Baseline 22.85 seconds; 18.76 secs    Time 5    Period Weeks    Status Achieved             PT Long Term Goals - 08/22/20 1646      PT LONG TERM GOAL #1   Title Pt and pt's spouse will be independent with final HEP in order to build upon functional gains made in therapy. ALL LTGS DUE 10/17/20    Time 9   due to delay in scheduling   Period Weeks    Status New      PT LONG TERM GOAL #2   Title Pt will ambulate 72' with no AD vs. LRAD over level indoor surfaces with supervision in order to demo improved household mobility.    Time 9    Period Weeks    Status New      PT LONG TERM GOAL #3   Title Pt will perform TUG in 18 seconds or less with  LRAD in order to demo decr fall risk.    Baseline 23.91 seconds    Time 9    Period Weeks    Status New      PT LONG TERM GOAL #4   Title Pt will  perform 5x sit <> stand in 17 seconds or less with no UE support from chair in order to demo decr fall risk.    Baseline 22.85 seconds    Time 9    Period Weeks    Status New      PT LONG TERM GOAL #5   Title Pt will improve BERG to at least a 48/56 in order to demo decr fall risk.    Baseline 45/56    Time 9    Period Weeks    Status Revised                 Plan - 09/19/20 1346    Clinical Impression Statement Completed assesment of patient's progress toward STG. Patient able to meet STG #1,4 and 5. As well as demonstrate progress toward STG #2 and 3. Assesed goal with SPC with quad tip today, as working toward reduced assitive device. Patient is making steady progress with PT services. Rest of session spent working on standing balance activites and balance reactions. Will continue to progress toward all LTGs.    Personal Factors and Comorbidities Comorbidity 1;Past/Current Experience;Profession    Comorbidities HTN    Examination-Activity Limitations Stand;Stairs;Squat;Transfers;Locomotion Level    Examination-Participation Restrictions Community Activity;Occupation   walking his dog   Stability/Clinical Decision Making Stable/Uncomplicated    Rehab Potential Good    PT Frequency 1x / week   1-2   PT Duration 8 weeks    PT Treatment/Interventions ADLs/Self Care Home Management;Aquatic Therapy;Electrical Stimulation;DME Instruction;Gait training;Stair training;Functional mobility training;Therapeutic activities;Therapeutic exercise;Balance training;Neuromuscular re-education;Wheelchair mobility training;Orthotic Fit/Training;Patient/family education;Passive range of motion;Energy conservation;Vestibular    PT Next Visit Plan Patient  may bring in hurrycane, to see/feel difference between hurry cane and SPC with quad tip.  add to HEP as appropriate. Continue gait with SPC with quad tip- do outside if weather is nice! pt might bring in other shoes to show how his walking would look  without an AFO (states he stil needs to use one, but would want to see how it looks) balance strategies on different surfaces/decr UE support, NMR/strengthening for LLE. Try tall kneeling.    PT Home Exercise Plan [id number]    Consulted and Agree with Plan of Care Patient           Patient will benefit from skilled therapeutic intervention in order to improve the following deficits and impairments:  Abnormal gait,Decreased activity tolerance,Decreased balance,Decreased mobility,Decreased knowledge of use of DME,Decreased coordination,Decreased range of motion,Decreased strength,Difficulty walking,Impaired tone,Impaired sensation,Postural dysfunction  Visit Diagnosis: Other abnormalities of gait and mobility  Muscle weakness (generalized)  Unsteadiness on feet     Problem List Patient Active Problem List   Diagnosis Date Noted  . History of CVA (cerebrovascular accident) 01/19/2020  . Normocytic anemia   . Benign essential HTN   . Hyponatremia   . Acute blood loss anemia   . Hypotension due to drugs   . Vascular headache   . Prerenal azotemia   . Sleep disturbance   . Leukocytosis   . Transaminitis   . Marijuana abuse   . Essential hypertension   . Dysphagia, post-stroke   . Left hemiparesis ([place])   . Acute respiratory failure ([place])   . ICH (intracerebral hemorrhage) (  [place]) 12/06/2019    Jones Bales, PT, DPT 09/19/2020, 1:50 PM  [facility] [address] [place], [place], [address] Phone: [phone]   Fax:  [phone]  Name: Daelan Gatt MRN: [id number] Date of Birth: 03/17/1981

## 2020-09-19 NOTE — Therapy (Signed)
[facility] [address] [place], [place], [address] Phone: [phone]   Fax:  [phone]  Occupational Therapy Treatment  Patient Details  Name: Dennis Mercado MRN: [id number] Date of Birth: 1980/11/08 Referring Provider (OT): Dr. Letta Pate   Encounter Date: 09/19/2020   OT End of Session - 09/19/20 1338    Visit Number 8    Number of Visits 16    Date for OT Re-Evaluation 09/23/20    Authorization - Visit Number 8    Authorization - Number of Visits 16    OT Start Time 4580    OT Stop Time 1315    OT Time Calculation (min) 40 min    Activity Tolerance Patient tolerated treatment well    Behavior During Therapy [facility] for tasks assessed/performed           History reviewed. No pertinent past medical history.  Past Surgical History:  Procedure Laterality Date  . WISDOM TOOTH EXTRACTION      There were no vitals filed for this visit.   Subjective Assessment - 09/19/20 1238    Subjective  I have been trying to use my arm to get up    Patient is accompanied by: Family member    Pertinent History Pt. presented to ED on 12/06/2019 with left side weakness and  altered mental status s/p coitus.  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 Pt required intubation 6/22 due to ARF secondary to aspiration PNA. Received inpatient rehab. Pt was discharged 7/28/2    Limitations HTN, left inattention, apraxia    Patient Stated Goals to be able to use my hand    Currently in Pain? No/denies    Pain Score 0-No pain                        OT Treatments/Exercises (OP) - 09/19/20 1335      ADLs   Work Patient continuing to work with his workplace to return to a modified schedule.      Neurological Re-education Exercises   Other Exercises 1 Neuromuscular reeducation to address isolated movement in left upper extremity,   Worked on loading left arm, then body on arm motion.  Patient with excessive co-activation in left arm and trunk.  Patient now recognizes overactivation and able to self correct with decreased effort.  Worked on active rotation of humerus and forearm to pick up and release small lightweight objects in left hand,                    OT Short Term Goals - 09/19/20 1340      OT SHORT TERM GOAL #1   Title I with inital HEP    Time 4    Period Weeks    Status Achieved      OT SHORT TERM GOAL #2   Title Patient will demonstrate low reach to 30 degrees of shoulder flexion with min assist    Time 4    Period Weeks    Status On-going      OT SHORT TERM GOAL #3   Title Patient will demonstrate 30 degrees of active assited wrist and forearm motion    Time 4    Period Weeks    Status On-going      OT SHORT TERM GOAL #4   Title xxx      OT SHORT TERM GOAL #5  Title xxx             OT Long Term Goals - 09/19/20 1340      OT LONG TERM GOAL #1   Title I with aquatic HEP    Time 8    Period Weeks    Status On-going      OT LONG TERM GOAL #2   Title Patient will demonstrate low reach to 45 degrees of shoulder flexion with elbow extension    Time 8    Period Weeks    Status On-going      OT LONG TERM GOAL #3   Title Patient will demonstrate no pain with passive range of motion to 120 shoulder flexion/abduction    Time 8    Period Weeks    Status On-going      OT LONG TERM GOAL #4   Title xxx      OT LONG TERM GOAL #5   Title xxx      OT LONG TERM GOAL #6   Title xxx                 Plan - 09/19/20 1339    Clinical Impression Statement Patient beginning to show more balanced muscle activation and some isolated control in LUE    OT Occupational Profile and History Detailed Assessment- Review of Records and additional review of physical, cognitive, psychosocial history related to current functional performance    Occupational performance deficits  (Please refer to evaluation for details): ADL's;IADL's;Leisure;Social Participation;Work;Play    Body Structure / Function / Physical Skills ADL;Balance;Endurance;Mobility;Strength;UE functional use;FMC;Vision;Coordination;Gait;Decreased knowledge of precautions;GMC;ROM;Decreased knowledge of use of DME;Dexterity;IADL;Sensation;Tone;Body Banker;Safety Awareness;Thought;Understand    Rehab Potential Good    Clinical Decision Making Limited treatment options, no task modification necessary    Comorbidities Affecting Occupational Performance: May have comorbidities impacting occupational performance    Modification or Assistance to Complete Evaluation  No modification of tasks or assist necessary to complete eval    OT Frequency 2x / week    OT Duration 8 weeks    OT Treatment/Interventions Self-care/ADL training;Therapeutic exercise;Functional Mobility Training;Balance training;Splinting;Manual Therapy;Neuromuscular education;Aquatic Therapy;Ultrasound;Energy conservation;Therapeutic activities;Cryotherapy;Paraffin;DME and/or AE instruction;Cognitive remediation/compensation;Visual/perceptual remediation/compensation;Gait Training;Fluidtherapy;Electrical Stimulation;Moist Heat;Contrast Bath;Passive range of motion;Patient/family education    Plan NEEDS RECERT - CHECK GOALS aquatic therapy and clinic therapy to address spasticity, need left trunk lengthening/right trunk shortening, realignment of shoulder girdle, and isolated glenohumeral joint motion    Consulted and Agree with Plan of Care Patient;Family member/caregiver    Family Member Consulted wife April           Patient will benefit from skilled therapeutic intervention in order to improve the following deficits and impairments:   Body Structure / Function / Physical Skills: ADL,Balance,Endurance,Mobility,Strength,UE functional use,FMC,Vision,Coordination,Gait,Decreased knowledge of  precautions,GMC,ROM,Decreased knowledge of use of DME,Dexterity,IADL,Sensation,Tone,Body mechanics Cognitive Skills: Attention,Memory,Problem Solve,Safety Awareness,Thought,Understand     Visit Diagnosis: Hemiplegia and hemiparesis following cerebral infarction affecting left non-dominant side ([facility])  Unsteadiness on feet  Other disturbances of skin sensation  Muscle weakness (generalized)  Other symptoms and signs involving the nervous system    Problem List Patient Active Problem List   Diagnosis Date Noted  . History of CVA (cerebrovascular accident) 01/19/2020  . Normocytic anemia   . Benign essential HTN   . Hyponatremia   . Acute blood loss anemia   . Hypotension due to drugs   . Vascular headache   . Prerenal azotemia   . Sleep disturbance   . Leukocytosis   .  Transaminitis   . Marijuana abuse   . Essential hypertension   . Dysphagia, post-stroke   . Left hemiparesis ([place])   . Acute respiratory failure ([place])   . ICH (intracerebral hemorrhage) ([place]) 12/06/2019    Mariah Milling, OTR/L 09/19/2020, 1:41 PM  [facility] [address] [place], [place], [address] Phone: [phone]   Fax:  [phone]  Name: Dennis Mercado MRN: [id number] Date of Birth: 08-17-1980

## 2020-09-25 ENCOUNTER — Encounter: Payer: Self-pay | Admitting: Occupational Therapy

## 2020-09-25 ENCOUNTER — Ambulatory Visit: Payer: Managed Care, Other (non HMO) | Admitting: Occupational Therapy

## 2020-09-25 DIAGNOSIS — R2689 Other abnormalities of gait and mobility: Secondary | ICD-10-CM | POA: Diagnosis not present

## 2020-09-25 NOTE — Therapy (Signed)
[facility] [address] [place], [place], [address] Phone: [phone]   Fax:  [phone]  Occupational Therapy Treatment  Patient Details  Name: Dennis Mercado MRN: [id number] Date of Birth: 01/09/1981 Referring Provider (OT): Dr. Letta Pate   Encounter Date: 09/25/2020   OT End of Session - 09/25/20 2020    Visit Number 9    Number of Visits 16    Date for OT Re-Evaluation 11/24/20    Authorization Type need to be determined    Authorization Time Period per patient 30 visit OT limit    Authorization - Visit Number 9    Authorization - Number of Visits 16    OT Start Time 1962    OT Stop Time 1500    OT Time Calculation (min) 45 min    Equipment Utilized During Treatment floatation equipment    Behavior During Therapy [facility] for tasks assessed/performed           History reviewed. No pertinent past medical history.  Past Surgical History:  Procedure Laterality Date  . WISDOM TOOTH EXTRACTION      There were no vitals filed for this visit.   Subjective Assessment - 09/25/20 2019    Subjective  Patient waiting to hear back regarding return to work    Patient is accompanied by: Family member    Pertinent History Pt. presented to ED on 12/06/2019 with left side weakness and  altered mental status s/p coitus.  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 Pt required intubation 6/22 due to ARF secondary to aspiration PNA. Received inpatient rehab. Pt was discharged 7/28/2    Limitations HTN, left inattention, apraxia    Currently in Pain? No/denies    Pain Score 0-No pain           Patient continues to show improved functional mobility and passive range of motion and functional use of LUE.   Patient seen for aquatic therapy visi.  Patient entered and exited the pool via stairs and supervision.  Patient able to attmep step  through pattern with min assistance.  Therapy session took place in 3.5-4 ft of water.  In supine with floatation equipment worked to improve passive range of motion of left shoulder girdle.  Patient achieved 160 degrees of shoulder scaption this session without pain - also working to improve passive to active elbow and wrist extension.                         OT Short Term Goals - 09/25/20 2022      OT SHORT TERM GOAL #1   Title I with inital HEP    Time 4    Period Weeks    Status Achieved      OT SHORT TERM GOAL #2   Title Patient will demonstrate low reach to 30 degrees of shoulder flexion with min assist    Time 4    Period Weeks    Status Achieved      OT SHORT TERM GOAL #3   Title Patient will demonstrate 30 degrees of active assited wrist and forearm motion    Time 4    Period Weeks    Status Achieved      OT SHORT TERM GOAL #4   Title xxx      OT SHORT TERM GOAL #5   Title xxx  OT Long Term Goals - 09/25/20 2023      OT LONG TERM GOAL #1   Title I with aquatic HEP    Time 8    Period Weeks    Status On-going      OT LONG TERM GOAL #2   Title Patient will demonstrate low reach to 45 degrees of shoulder flexion with elbow extension    Time 8    Period Weeks    Status On-going      OT LONG TERM GOAL #3   Title Patient will demonstrate no pain with passive range of motion to 120 shoulder flexion/abduction    Time 8    Period Weeks    Status Achieved      OT LONG TERM GOAL #4   Title xxx      OT LONG TERM GOAL #5   Title xxx      OT LONG TERM GOAL #6   Title xxx                 Plan - 09/25/20 2022    Clinical Impression Statement Patient showing improved passive range of motion of left UE    OT Occupational Profile and History Detailed Assessment- Review of Records and additional review of physical, cognitive, psychosocial history related to current functional performance    Occupational performance  deficits (Please refer to evaluation for details): ADL's;IADL's;Leisure;Social Participation;Work;Play    Body Structure / Function / Physical Skills ADL;Balance;Endurance;Mobility;Strength;UE functional use;FMC;Vision;Coordination;Gait;Decreased knowledge of precautions;GMC;ROM;Decreased knowledge of use of DME;Dexterity;IADL;Sensation;Tone;Body Banker;Safety Awareness;Thought;Understand    Rehab Potential Good    Clinical Decision Making Limited treatment options, no task modification necessary    Comorbidities Affecting Occupational Performance: May have comorbidities impacting occupational performance    Modification or Assistance to Complete Evaluation  No modification of tasks or assist necessary to complete eval    OT Frequency 2x / week    OT Duration 8 weeks    OT Treatment/Interventions Self-care/ADL training;Therapeutic exercise;Functional Mobility Training;Balance training;Splinting;Manual Therapy;Neuromuscular education;Aquatic Therapy;Ultrasound;Energy conservation;Therapeutic activities;Cryotherapy;Paraffin;DME and/or AE instruction;Cognitive remediation/compensation;Visual/perceptual remediation/compensation;Gait Training;Fluidtherapy;Electrical Stimulation;Moist Heat;Contrast Bath;Passive range of motion;Patient/family education    Plan aquatic therapy and clinic therapy to address spasticity, need left trunk lengthening/right trunk shortening, realignment of shoulder girdle, and isolated glenohumeral joint motion    Consulted and Agree with Plan of Care Patient;Family member/caregiver    Family Member Consulted wife April           Patient will benefit from skilled therapeutic intervention in order to improve the following deficits and impairments:   Body Structure / Function / Physical Skills: ADL,Balance,Endurance,Mobility,Strength,UE functional use,FMC,Vision,Coordination,Gait,Decreased knowledge of  precautions,GMC,ROM,Decreased knowledge of use of DME,Dexterity,IADL,Sensation,Tone,Body mechanics Cognitive Skills: Attention,Memory,Problem Solve,Safety Awareness,Thought,Understand     Visit Diagnosis: Hemiplegia and hemiparesis following cerebral infarction affecting left non-dominant side ([place]) - Plan: Ot plan of care cert/re-cert  Unsteadiness on feet - Plan: Ot plan of care cert/re-cert  Other disturbances of skin sensation - Plan: Ot plan of care cert/re-cert  Muscle weakness (generalized) - Plan: Ot plan of care cert/re-cert  Other symptoms and signs involving the nervous system - Plan: Ot plan of care cert/re-cert    Problem List Patient Active Problem List   Diagnosis Date Noted  . History of CVA (cerebrovascular accident) 01/19/2020  . Normocytic anemia   . Benign essential HTN   . Hyponatremia   . Acute blood loss anemia   . Hypotension due to drugs   . Vascular headache   .  Prerenal azotemia   . Sleep disturbance   . Leukocytosis   . Transaminitis   . Marijuana abuse   . Essential hypertension   . Dysphagia, post-stroke   . Left hemiparesis ([place])   . Acute respiratory failure ([place])   . ICH (intracerebral hemorrhage) ([place]) 12/06/2019    Mariah Milling 09/25/2020, 8:28 PM  [facility] [address] [place], [place], [address] Phone: [phone]   Fax:  [phone]  Name: Pearlie Lafosse MRN: [id number] Date of Birth: 07-06-1980

## 2020-09-27 ENCOUNTER — Other Ambulatory Visit: Payer: Self-pay

## 2020-09-27 ENCOUNTER — Ambulatory Visit: Payer: Managed Care, Other (non HMO) | Admitting: Physical Therapy

## 2020-09-27 ENCOUNTER — Encounter: Payer: Self-pay | Admitting: Physical Therapy

## 2020-09-27 DIAGNOSIS — R2681 Unsteadiness on feet: Secondary | ICD-10-CM

## 2020-09-27 DIAGNOSIS — R2689 Other abnormalities of gait and mobility: Secondary | ICD-10-CM | POA: Diagnosis not present

## 2020-09-27 DIAGNOSIS — R29818 Other symptoms and signs involving the nervous system: Secondary | ICD-10-CM

## 2020-09-27 DIAGNOSIS — M6281 Muscle weakness (generalized): Secondary | ICD-10-CM

## 2020-09-27 DIAGNOSIS — I69354 Hemiplegia and hemiparesis following cerebral infarction affecting left non-dominant side: Secondary | ICD-10-CM

## 2020-09-27 NOTE — Therapy (Signed)
[facility] [address] [place], [place], [address] Phone: [phone]   Fax:  [phone]  Physical Therapy Treatment  Patient Details  Name: Dennis Mercado MRN: [id number] Date of Birth: 1980/08/06 Referring Provider (PT): Letta Pate [name], MD   Encounter Date: 09/27/2020   PT End of Session - 09/27/20 1535    Visit Number 7    Number of Visits 17   visits dependent on pt's co pay, written for 1-2x week for 8 weeks   Authorization Type VL: 60 visits PT, 30 OT    Authorization - Visit Number 6    Authorization - Number of Visits 60    PT Start Time 2423   pt arrived late   PT Stop Time 1530    PT Time Calculation (min) 39 min    Equipment Utilized During Treatment Gait belt    Activity Tolerance Patient tolerated treatment well    Behavior During Therapy [facility] for tasks assessed/performed           History reviewed. No pertinent past medical history.  Past Surgical History:  Procedure Laterality Date  . WISDOM TOOTH EXTRACTION      There were no vitals filed for this visit.   Subjective Assessment - 09/27/20 1455    Subjective No changes or complaints. tried using the hurrycane outside, but felt a little more unsteady with it and then went back in and got his [facility]. Exercises are going well at home.    Pertinent History HTN    Limitations Walking    Patient Stated Goals wants to work on not walking with the cane    Currently in Pain? No/denies                             [facility] Adult PT Treatment/Exercise - 09/27/20 1528      Ambulation/Gait   Ambulation/Gait Yes    Ambulation/Gait Assistance 5: Supervision    Ambulation/Gait Assistance Details ambulated outdoors with 3 prong cane from clinic (hurry cane) outdoors with no issues and no LOB. Also ambulated over mulch surfaces, pt with more slowed gait speed when performing, no LOB    Ambulation Distance (Feet) 500 Feet   plus  additional clinic distances indoors   Assistive device Straight cane - hurry cane with 3 prong tip and SBQC   Gait Pattern Step-through pattern;Decreased step length - left;Decreased stance time - left;Decreased hip/knee flexion - left;Decreased arm swing - left    Ambulation Surface Level;Indoor;Unlevel;Outdoor;Paved;Other (comment)   mulch   Curb 5: Supervision    Curb Details (indicate cue type and reason) x3 reps with hurry cane - x2 reps leading with RLE and x1 reps leading with LLE (pt needing min guard doing it this way)    Gait Comments gait with SBQC over 2 blue/red mats with bean bags underneath to mimic the sand down and back x3 reps with min guard, pt with no LOB, just more slowed gait speed.               Balance Exercises - 09/27/20 1529      Balance Exercises: Standing   Standing Eyes Opened Foam/compliant surface    Standing Eyes Opened Limitations feet hip width distance on blue foam beam head turns 2 x 10 reps, initial min guard and intermittent UE support for balance    Stepping Strategy Anterior;Posterior;Foam/compliant surface;Limitations    Stepping Strategy Limitations on blue foam beam with  LLE as stance leg stepping RLE fwds>midline>backwards x12 repes with fingertip support, then lateral stepping RLE out and in with LLE as stance leg x15 reps with RUE support for balance               PT Short Term Goals - 09/19/20 1153      PT SHORT TERM GOAL #1   Title Pt will undergo further assessment of BERG with LTG written as appropriate. ALL STGS DUE 09/19/20.    Baseline 45/56 on 08/22/20, LTG revised    Time 5   due to delay in scheduling   Period Weeks    Status Achieved    Target Date 09/19/20      PT SHORT TERM GOAL #2   Title Pt will perform TUG in 20 seconds or less with SBQC in order to demo decr fall risk.    Baseline 23.91 seconds; 23.82 secs with SPC with quad tip    Time 5    Period Weeks    Status Partially Met      PT SHORT TERM GOAL #3    Title Pt will improve gait speed with SBQC to at least 1.6 ft/sec in order to demo decr fall risk.    Baseline 1.35 ft/sec; 1.39 ft/sec with SPC with quad tip    Time 5    Period Weeks    Status Partially Met      PT SHORT TERM GOAL #4   Title Pt will perform 4 steps with use of railing and or SBQC with supervision in order to demo improved community access.    Baseline 8 stairs with single rail on R and supervision    Time 5    Period Weeks    Status Achieved      PT SHORT TERM GOAL #5   Title Pt will perform 5x sit <> stand in 20 seconds or less with no UE support from chair in order to demo decr fall risk.    Baseline 22.85 seconds; 18.76 secs    Time 5    Period Weeks    Status Achieved             PT Long Term Goals - 08/22/20 1646      PT LONG TERM GOAL #1   Title Pt and pt's spouse will be independent with final HEP in order to build upon functional gains made in therapy. ALL LTGS DUE 10/17/20    Time 9   due to delay in scheduling   Period Weeks    Status New      PT LONG TERM GOAL #2   Title Pt will ambulate 67' with no AD vs. LRAD over level indoor surfaces with supervision in order to demo improved household mobility.    Time 9    Period Weeks    Status New      PT LONG TERM GOAL #3   Title Pt will perform TUG in 18 seconds or less with LRAD in order to demo decr fall risk.    Baseline 23.91 seconds    Time 9    Period Weeks    Status New      PT LONG TERM GOAL #4   Title Pt will perform 5x sit <> stand in 17 seconds or less with no UE support from chair in order to demo decr fall risk.    Baseline 22.85 seconds    Time 9    Period Weeks    Status New  PT LONG TERM GOAL #5   Title Pt will improve BERG to at least a 48/56 in order to demo decr fall risk.    Baseline 45/56    Time 9    Period Weeks    Status Revised                 Plan - 09/27/20 1539    Clinical Impression Statement Performed gait outdoors with 3 prong hurry cane  with supervision over pavement, pt with no issues and no LOB. Remainder of session focused on gait training over compliant surfaces to try to mimic sand as pt will be going to the beach in a couple of weeks and balance strategies on compliant surfaces with incr weight shift to LLE. Pt tolerated session well, will continue to progress towards LTGs.    Personal Factors and Comorbidities Comorbidity 1;Past/Current Experience;Profession    Comorbidities HTN    Examination-Activity Limitations Stand;Stairs;Squat;Transfers;Locomotion Level    Examination-Participation Restrictions Community Activity;Occupation   walking his dog   Stability/Clinical Decision Making Stable/Uncomplicated    Rehab Potential Good    PT Frequency 1x / week   1-2   PT Duration 8 weeks    PT Treatment/Interventions ADLs/Self Care Home Management;Aquatic Therapy;Electrical Stimulation;DME Instruction;Gait training;Stair training;Functional mobility training;Therapeutic activities;Therapeutic exercise;Balance training;Neuromuscular re-education;Wheelchair mobility training;Orthotic Fit/Training;Patient/family education;Passive range of motion;Energy conservation;Vestibular    PT Next Visit Plan pt might bring in other shoes to show how his walking would look without an AFO (states he stil needs to use one, but would want to see how it looks) balance strategies on different surfaces/decr UE support, NMR/strengthening for LLE. Try tall kneeling.    PT Home Exercise Plan [id number]    Consulted and Agree with Plan of Care Patient           Patient will benefit from skilled therapeutic intervention in order to improve the following deficits and impairments:  Abnormal gait,Decreased activity tolerance,Decreased balance,Decreased mobility,Decreased knowledge of use of DME,Decreased coordination,Decreased range of motion,Decreased strength,Difficulty walking,Impaired tone,Impaired sensation,Postural dysfunction  Visit  Diagnosis: Hemiplegia and hemiparesis following cerebral infarction affecting left non-dominant side (HCC)  Unsteadiness on feet  Muscle weakness (generalized)  Other symptoms and signs involving the nervous system     Problem List Patient Active Problem List   Diagnosis Date Noted  . History of CVA (cerebrovascular accident) 01/19/2020  . Normocytic anemia   . Benign essential HTN   . Hyponatremia   . Acute blood loss anemia   . Hypotension due to drugs   . Vascular headache   . Prerenal azotemia   . Sleep disturbance   . Leukocytosis   . Transaminitis   . Marijuana abuse   . Essential hypertension   . Dysphagia, post-stroke   . Left hemiparesis ([place])   . Acute respiratory failure ([place])   . ICH (intracerebral hemorrhage) ([place]) 12/06/2019    Arliss Journey, PT, DPT  09/27/2020, 3:40 PM  [facility] [address] [place], [place], [address] Phone: [phone]   Fax:  [phone]  Name: Ceejay Kegley MRN: [id number] Date of Birth: 21-Jan-1981

## 2020-10-03 ENCOUNTER — Encounter: Payer: Self-pay | Admitting: Adult Health

## 2020-10-03 ENCOUNTER — Ambulatory Visit: Payer: Managed Care, Other (non HMO) | Admitting: Adult Health

## 2020-10-03 VITALS — BP 122/86 | HR 91 | Ht 67.0 in | Wt 150.0 lb

## 2020-10-03 DIAGNOSIS — I619 Nontraumatic intracerebral hemorrhage, unspecified: Secondary | ICD-10-CM

## 2020-10-03 DIAGNOSIS — R569 Unspecified convulsions: Secondary | ICD-10-CM

## 2020-10-03 DIAGNOSIS — I69398 Other sequelae of cerebral infarction: Secondary | ICD-10-CM | POA: Diagnosis not present

## 2020-10-03 DIAGNOSIS — G8114 Spastic hemiplegia affecting left nondominant side: Secondary | ICD-10-CM

## 2020-10-03 MED ORDER — LEVETIRACETAM 250 MG PO TABS: 250.0000 mg | ORAL_TABLET | Freq: Two times a day (BID) | ORAL | 0 refills | Status: DC

## 2020-10-03 NOTE — Progress Notes (Signed)
[facility] [address] [place]. [place] [address] [phone]       STROKE FOLLOW UP NOTE  [name] Date of Birth:  02-14-1981 Medical Record Number:  [id number]   Reason for Referral:  stroke follow up   Chief Complaint  Patient presents with  . Follow-up    RM 14 alone Pt is well, therapy is helping, getting better slowly but for surely. Still has some L side weakness and hand immobility.      SUBJECTIVE:   HPI:   Today, 10/03/2020, Dennis Mercado returns for 34-month stroke follow-up unaccompanied  He has been doing well since prior visit with continued improvement of left spastic hemiparesis. Continues to work with neuro rehab PT/OT as well as receiving Botox injections by Dr. Letta Pate.  He continues to ambulate with a cane and denies any recent falls.  He attempted to return back to his prior job with restricted hours but unfortunately they would not accept these restrictions.  He is currently in the process of looking for a different job and recently had an interview doing collision estimates.  Denies new or worsening stroke/TIA symptoms  He has remained on Keppra 500 mg twice daily without any seizure activity Remains on atorvastatin 40 mg daily without associated side effects - has f/u with PCP Thursday and plans on repeating lab work Blood pressure today 122/86 - checks blood pressure at home in the morning and evening prior to taking his medications and typically SBP 120s He is currently working with his PCP to decrease blood pressure medications He reports increased fatigue and lightheadedness shortly after taking morning and evening medications  No further concerns at this time      History provided for reference purposes only Update 05/30/2020 JM: Dennis Mercado is being seen via MyChart virtual visit for stroke follow-up.  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  Currently receiving Botox injections by Dr.  Letta Pate with some improvement and plans on receiving higher dosage around mid January. He has been ambulating with cane and short distance without cane without any recent falls. Return back to driving on graduated return to driving basis per Dr. Letta Pate and has not had any difficulty.  Denies ongoing cognitive difficulties.  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  He reports discussing this further with his current employer who agreed to work with him with his restriction.  Denies new or worsening stroke/TIA symptoms.  Reports ongoing compliance with atorvastatin 40 mg daily.  Blood pressure stable at home per patient typically 120s/70s with ongoing use of lisinopril, carvedilol and amlodipine.  Remains on Keppra 500 mg twice daily without side effects and denies any seizure activity.  No further concerns at this time.   Initial visit 02/08/2020 JM: Dennis Mercado is being seen for hospital follow-up accompanied by his wife. He was discharged home from CIR on 01/12/2020 after a 28-day stay. He has been making excellent progress with residual hemiparesis, cognitive impairment, and left facial droop Denies residual dysphagia or visual impairment Currently working with neuro rehab PT and speech with ongoing improvement Able to ambulate with RW short distance; w/c long distance Denies new or worsening stroke/TIA symptoms No recurrent seizure activity and remains on Keppra 500 mg twice daily tolerating well without side effects Continues on atorvastatin 40 mg daily without myalgias Blood pressure today 118/72 and remains on lisinopril, clonidine, carvedilol and amlodipine Monitors at home which  has been stable Reports complete cessation of THC, tobacco and EtOH use No concerns  Stroke admission 12/06/2019 Dennis Mercado is a 40 y.o. male with recent HTN at dentist office with plans for PCP visit next week, who presented on 12/06/2019  post coitus with L sided weakness, and elevated BP. 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 Hypertensive emergency upon arrival with BP 119/162 treated with Cleviprex and placed on Coreg, amlodipine, hydralazine, clonidine and lisinopril. Seizure activity following extubation on 6/25 discharged on Keppra with EEG showing severe diffuse encephalopathy (likely post sedation). LDL 157 and initiate atorvastatin 40 mg daily. Other stroke risk factors include EtOH use and THC use but no prior stroke history. Residual deficits of dysphagia, right gaze preference, left hemianopia, left facial droop, and left hemiparesis. 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 IVH. Local mass effect w/o midline shift  CTA head Unremarkable   CT head at 12h stable  CT head 6/22 0440 stable R thalamic hemorrhage, IVH. Sllight increased in ventricle size. 4-5 midline shift  CT head 6/22 remains stable     MRI 6/23 - Unchanged size of right thalamic ICH with mild surrounding edema. No hydrocephalus or ventricular entrapment.   CT Head 6/26 - No substantial change  EEG - severe diffuse encephalopathy  Carotid Doppler - unremarkable  2D Echo EF 60-65%. No source of embolus    LDL 157  HgbA1c 5.6   Heparin subq for VTE prophylaxis  No antithrombotic prior to admission, now on No antithrombotic given hemorrhage   Therapy recommendations: CIR   Disposition:  CIR  Seizure   Agitation following extubation 6/25  L trapezius muscle contractions, extending to R trapezius, with subsequent Upper and lower extremity rigidity with rhythmic contraction. Pt unresponsive, upward deviated gaze. C/w seizure   S/p ativan  On depakote 500 Q6h -> 750 Q8h -> off  Now on keppra  EEG severe diffuse encephalopathy  Depakote level - 39        ROS:   14  system review of systems performed and negative with exception of weakness  PMH: History reviewed. No pertinent past medical history.  PSH:  Past Surgical History:  Procedure Laterality Date  . WISDOM TOOTH EXTRACTION      Social History:  Social History   Socioeconomic History  . Marital status: Married    Spouse name: Not on file  . Number of children: Not on file  . Years of education: Not on file  . Highest education level: Not on file  Occupational History  . Not on file  Tobacco Use  . Smoking status: Never Smoker  . Smokeless tobacco: Never Used  Vaping Use  . Vaping Use: Never used  Substance and Sexual Activity  . Alcohol use: Not Currently    Comment: several drinks over the weekend  . Drug use: Never  . Sexual activity: Not on file  Other Topics Concern  . Not on file  Social History Narrative  . Not on file   Social Determinants of Health   Financial Resource Strain: Not on file  Food Insecurity: Not on file  Transportation Needs: Not on file  Physical Activity: Not on file  Stress: Not on file  Social Connections: Not on file  Intimate Partner Violence: Not on file    Family History: History reviewed. No pertinent  family history.  Medications:   Current Outpatient Medications on File Prior to Visit  Medication Sig Dispense Refill  . acetaminophen (TYLENOL) 325 MG tablet Take 2 tablets (650 mg total) by mouth every 4 (four) hours as needed for mild pain (or temp > 37.5 C (99.5 F)).    [place] Kitchen amLODipine (NORVASC) 10 MG tablet TAKE 1 TABLET(10 MG) BY MOUTH DAILY 90 tablet 0  . atorvastatin (LIPITOR) 40 MG tablet TAKE 1 TABLET(40 MG) BY MOUTH DAILY 90 tablet 0  . carvedilol (COREG) 25 MG tablet TAKE 1 TABLET(25 MG) BY MOUTH TWICE DAILY WITH A MEAL 90 tablet 0  . levETIRAcetam (KEPPRA) 500 MG tablet Take 1 tablet (500 mg total) by mouth 2 (two) times daily. 180 tablet 3  . lisinopril (ZESTRIL) 5 MG tablet TAKE 1 TABLET(5 MG) BY MOUTH DAILY 90 tablet 0  .  omeprazole (PRILOSEC OTC) 20 MG tablet Take 20 mg by mouth daily.      No current facility-administered medications on file prior to visit.    Allergies:   Allergies  Allergen Reactions  . Percocet [Oxycodone-Acetaminophen] Itching    Pt says he has previously tolerated.       OBJECTIVE: Today's Vitals   10/03/20 1531  BP: 122/86  Pulse: 91  Weight: 150 lb (68 kg)  Height: 5\' 7"  (1.702 m)   Body mass index is 23.49 kg/m.  General: well developed, well nourished, very pleasant young African-American male, seated, in no evident distress Head: head normocephalic and atraumatic.   Neck: supple with no carotid or supraclavicular bruits Cardiovascular: regular rate and rhythm, no murmurs Musculoskeletal: no deformity Skin:  no rash/petichiae Vascular:  Normal pulses all extremities   Neurologic Exam Mental Status: Awake and fully alert.  Fluent speech and language. Oriented to place and time. Recent and remote memory intact. Attention span, concentration and fund of knowledge appropriate. Mood and affect appropriate.  Cranial Nerves: Pupils equal, briskly reactive to light. Extraocular movements full without nystagmus. Visual fields full to confrontation. Hearing intact. Facial sensation intact.  Left lower facial weakness.  Tongue, palate moves normally and symmetrically.  Motor: Normal strength, bulk and tone right upper and lower extremity LUE:4+/5 proximal; limited hand movement due to contractures with increased tone throughout LLE: 4+/5 with slightly increased tone Sensory.: intact to touch , pinprick , position and vibratory sensation.  Coordination: Rapid alternating movements normal on right side. Finger-to-nose and heel-to-shin performed accurately on right side. Gait and Station: Arises from chair without difficulty. Stance is normal. Gait demonstrates  hemiplegic gait and circumduction step with mild unsteadiness with turns and use of cane.  Tandem walk and heel toe not  attempted.  Reflexes: 1+ right side and brisk left side. Toes downgoing.        ASSESSMENT: Dennis Mercado is a 40 y.o. year old male presented with left-sided weakness and hypertensive emergency on 12/06/2019 with stroke work-up revealing right thalamic ICH with IVH and midline shift secondary to hypertension. Recently diagnosed HTN at dentist office but had not had PCP follow-up prior to event. Also has seizure activity during admission placed on Keppra. Vascular risk factors include newly diagnosed HTN, HLD, poststroke seizures, EtOH use and substance abuse.      PLAN:  1. Hypertensive right thalamic ICH:  a. Residual deficit: Left hemiparesis with spasticity i. Has been making excellent improvement overall.  Encouraged continued participation with outpatient PT/OT and routine follow-up with Dr. Letta Pate receiving Botox injections.  Advised use of cane at all times unless  otherwise instructed b. Continue atorvastatin 40 mg daily for secondary stroke prevention.  c. No indication for antithrombotic as no prior stroke history or cardiovascular risk factors d. Discussed secondary stroke prevention measures and importance of close PCP follow up for aggressive stroke risk factor management  e. HTN: BP goal <130/90.  Stable on lisinopril, carvedilol and amlodipine per PCP -advised to further discuss BP medications with PCP Thursday at follow-up visit as lightheadedness complaints could be possible medication side effects f. HLD: LDL goal <70.  Prior LDL 66 on atorvastatin 40 mg daily per PCP - f/u Thursday for repeat lab work 2. Seizures, poststroke: a. Only 1 seizure event while being extubated during hospitalization in 11/2019 b. As no additional seizure activity since that time and potential medication side effects (lightheadedness and fatigue), patient requests possibly decreasing dosage.  Recommend decreasing Keppra from 500 mg twice daily to 250mg  twice daily for 2 months then stop. Will  plan on repeating EEG towards the end of June    Follow up in 4 months or call earlier if needed   CC:  [facility] provider: Dr. Cira Rue, Mortimer Fries, MD     I spent 30 minutes of face-to-face and non-face-to-face time with patient.  This included previsit chart review, lab review, study review, order entry, electronic health record documentation, patient education regarding stroke and residual deficits, seizure activity post stroke and weaning off from [place], importance of managing stroke risk factors and answered all other questions to patient satisfaction  Frann Rider, [facility]  [facility] [address] [place], [place] [address]  Phone [phone] Fax [phone] Note: This document was prepared with digital dictation and possible smart phrase technology. Any transcriptional errors that result from this process are unintentional.

## 2020-10-03 NOTE — Patient Instructions (Signed)
Continue atorvatatin  for secondary stroke prevention  Recommend decreasing keppra 250mg  twice daily for 2 months then stop  Obtain EEG around end of June to ensure no irritability or seizures once off keppra  Continue to follow up with PCP regarding cholesterol and blood pressure management  Maintain strict control of hypertension with blood pressure goal below 130/90 and cholesterol with LDL cholesterol (bad cholesterol) goal below 70 mg/dL.      Followup in the future with me in 6 months or call earlier if needed      Thank you for coming to see [place] at [facility]. I hope we have been able to provide you high quality care today.  You may receive a patient satisfaction survey over the next few weeks. We would appreciate your feedback and comments so that we may continue to improve ourselves and the health of our patients.   Electroencephalogram, Adult An electroencephalogram (EEG) is a test that records electrical activity in the brain. It is often used to diagnose or monitor problems that are related to the brain, such as:  Seizure disorders.  Fainting spells.  Sleep problems.  Head injuries.  Changes in behavior. Tell a health care provider about:  Any allergies you have.  All medicines you are taking, including vitamins, herbs, eye drops, creams, and over-the-counter medicines.  Any medical conditions you have or have had, including psychiatric conditions.  Any surgeries you have had.  Any history of heavy drug or alcohol use. What are the risks? Generally, this is a safe test. If you have a seizure disorder, you may be made to have a seizure during the test. This is done so that your brain activity can be recorded during the seizure. What happens before the procedure?  Arrive with your hair clean, dry, and not styled. ? Do not comb your hair toward your scalp to add volume (backcomb). ? Do not put hair spray, oil, or other hair products in  your hair.  Do not have caffeine within 4 hours of having your test.  Follow instructions from your health care provider about how much sleep to get before the test. If you are asked to limit how much you sleep, you may need to have a responsible adult bring you to the test and take you home from the test.  Ask your health care provider about taking your regular and over-the-counter medicines, herbs, and supplements.   What happens during the procedure? You will be asked to sit in a chair or lie down. Many small metal discs (electrodes) will be carefully attached to your head with an adhesive. It may take some time to get all the electrodes on the right spots for the test. These electrodes will pick up on the signals in your brain, and a machine will record the signals. During the test, you may be asked to:  Sit or lie quietly and relax.  Open and close your eyes.  Breathe deeply and quickly (hyperventilate) for 3 minutes or longer.  Look at a flashing light for a short amount of time.  Read text or look at an image.  Go to sleep. When the test is complete, the electrodes will be removed by using a solution such as acetone or fingernail polish remover.   What can I expect after the test? After your test, it is common to have no after-effects from the test. You should be able to return to your normal daily activities right away. Keep in mind:  You may need to bathe after the test to remove the adhesive used to attach the electrodes. The adhesive is easily washed away.  If a seizure happened during your EEG, you may need further medical help depending on the type of seizure and how severe it was. It is up to you to get the results of your procedure. Ask your health care provider, or the department that is doing the procedure, when your results will be ready. Summary  An electroencephalogram (EEG) is a test that is often used to diagnose or monitor problems that are related to the  brain.  Do not have caffeine within 4 hours of having your test. Follow other instructions from your health care provider about sleep and medicines before the test.  During the procedure, small metal discs (electrodes) will be attached to your head with an adhesive.  You may be asked to sit or lie quietly and relax during the test. You may also be asked to do other activities during the test, such as watch flashing lights or breathe quickly. This information is not intended to replace advice given to you by your health care provider. Make sure you discuss any questions you have with your health care provider. Document Revised: 11/29/2019 Document Reviewed: 11/18/2019 Elsevier Patient Education  [place].

## 2020-10-04 ENCOUNTER — Encounter: Payer: Self-pay | Admitting: Occupational Therapy

## 2020-10-04 ENCOUNTER — Ambulatory Visit: Payer: Managed Care, Other (non HMO) | Admitting: Occupational Therapy

## 2020-10-04 ENCOUNTER — Ambulatory Visit: Payer: Managed Care, Other (non HMO) | Admitting: Physical Therapy

## 2020-10-04 ENCOUNTER — Other Ambulatory Visit: Payer: Self-pay

## 2020-10-04 DIAGNOSIS — I69354 Hemiplegia and hemiparesis following cerebral infarction affecting left non-dominant side: Secondary | ICD-10-CM

## 2020-10-04 DIAGNOSIS — M6281 Muscle weakness (generalized): Secondary | ICD-10-CM

## 2020-10-04 DIAGNOSIS — R2681 Unsteadiness on feet: Secondary | ICD-10-CM

## 2020-10-04 DIAGNOSIS — R29818 Other symptoms and signs involving the nervous system: Secondary | ICD-10-CM

## 2020-10-04 DIAGNOSIS — R208 Other disturbances of skin sensation: Secondary | ICD-10-CM

## 2020-10-04 DIAGNOSIS — R2689 Other abnormalities of gait and mobility: Secondary | ICD-10-CM | POA: Diagnosis not present

## 2020-10-04 NOTE — Therapy (Signed)
[facility] [address] [place], [place], [address] Phone: [phone]   Fax:  [phone]  Occupational Therapy Treatment  Patient Details  Name: Dennis Mercado MRN: [id number] Date of Birth: Oct 20, 1980 Referring Provider (OT): Dr. Letta Pate   Encounter Date: 10/04/2020   OT End of Session - 10/04/20 1608    Visit Number 10    Number of Visits 16    Date for OT Re-Evaluation 11/24/20    Authorization - Visit Number 10    Authorization - Number of Visits 16    OT Start Time 1400    OT Stop Time 4765    OT Time Calculation (min) 45 min    Activity Tolerance Patient tolerated treatment well    Behavior During Therapy [facility] for tasks assessed/performed           History reviewed. No pertinent past medical history.  Past Surgical History:  Procedure Laterality Date  . WISDOM TOOTH EXTRACTION      There were no vitals filed for this visit.   Subjective Assessment - 10/04/20 1604    Subjective  Patient is not returning to previous job, and is currently interviewing for other jobs    Pertinent History Pt. presented to ED on 12/06/2019 with left side weakness and  altered mental status s/p coitus.  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 Pt required intubation 6/22 due to ARF secondary to aspiration PNA. Received inpatient rehab. Pt was discharged 7/28/2    Limitations HTN, left inattention, apraxia    Patient Stated Goals to be able to use my hand    Currently in Pain? No/denies    Pain Score 0-No pain                        OT Treatments/Exercises (OP) - 10/04/20 0001      Neurological Re-education Exercises   Other Exercises 1 Neuromuscular reeducation to address ability to separate upper extremity movement from trunk movement with functional mobility.  Patient able to maintain grasp on upright support and  activate left shoulder flexore and elbow extensors while maintaining alignmed trunk in midline - needs active trunk shortening on right.  Able to weight shift onto left lef and unweight right foot, step forward, or step tap up on 4" block.  Patient with improved confidence and requiring dedecreased physical assistance with subsequent challenges.  Patient able to maintain active arm and trunk with stepping pattern.  Patient now able to self correct trunk position with only intermittent cueing.                  OT Education - 10/04/20 1608    Education Details informed patient of voactional rehab services    Person(s) Educated Patient    Methods Explanation    Comprehension Verbalized understanding            OT Short Term Goals - 10/04/20 1610      OT SHORT TERM GOAL #1   Title I with inital HEP    Time 4    Period Weeks    Status Achieved      OT SHORT TERM GOAL #2   Title Patient will demonstrate low reach to 30 degrees of shoulder flexion with min assist    Time 4    Period Weeks    Status Achieved      OT SHORT TERM GOAL #  3   Title Patient will demonstrate 30 degrees of active assited wrist and forearm motion    Time 4    Period Weeks    Status Achieved      OT SHORT TERM GOAL #4   Title xxx      OT SHORT TERM GOAL #5   Title xxx             OT Long Term Goals - 10/04/20 1610      OT LONG TERM GOAL #1   Title I with aquatic HEP    Time 8    Period Weeks    Status On-going      OT LONG TERM GOAL #2   Title Patient will demonstrate low reach to 45 degrees of shoulder flexion with elbow extension    Time 8    Period Weeks    Status On-going      OT LONG TERM GOAL #3   Title Patient will demonstrate no pain with passive range of motion to 120 shoulder flexion/abduction    Time 8    Period Weeks    Status Achieved      OT LONG TERM GOAL #4   Title xxx      OT LONG TERM GOAL #5   Title xxx      OT LONG TERM GOAL #6   Title xxx                  Plan - 10/04/20 1609    Clinical Impression Statement Patient showing improved awareness and ability to control trunk and head alignment during functional mobility.  This is precursor for freedom of LUE movement while walking    OT Occupational Profile and History Detailed Assessment- Review of Records and additional review of physical, cognitive, psychosocial history related to current functional performance    Occupational performance deficits (Please refer to evaluation for details): ADL's;IADL's;Leisure;Social Participation;Work;Play    Body Structure / Function / Physical Skills ADL;Balance;Endurance;Mobility;Strength;UE functional use;FMC;Vision;Coordination;Gait;Decreased knowledge of precautions;GMC;ROM;Decreased knowledge of use of DME;Dexterity;IADL;Sensation;Tone;Body Banker;Safety Awareness;Thought;Understand    Rehab Potential Good    Clinical Decision Making Limited treatment options, no task modification necessary    Comorbidities Affecting Occupational Performance: May have comorbidities impacting occupational performance    Modification or Assistance to Complete Evaluation  No modification of tasks or assist necessary to complete eval    OT Frequency 2x / week    OT Duration 8 weeks    OT Treatment/Interventions Self-care/ADL training;Therapeutic exercise;Functional Mobility Training;Balance training;Splinting;Manual Therapy;Neuromuscular education;Aquatic Therapy;Ultrasound;Energy conservation;Therapeutic activities;Cryotherapy;Paraffin;DME and/or AE instruction;Cognitive remediation/compensation;Visual/perceptual remediation/compensation;Gait Training;Fluidtherapy;Electrical Stimulation;Moist Heat;Contrast Bath;Passive range of motion;Patient/family education    Plan aquatic therapy and clinic therapy to address spasticity, need left trunk lengthening/right trunk shortening, realignment of shoulder girdle, and  isolated glenohumeral joint motion    Consulted and Agree with Plan of Care Patient           Patient will benefit from skilled therapeutic intervention in order to improve the following deficits and impairments:   Body Structure / Function / Physical Skills: ADL,Balance,Endurance,Mobility,Strength,UE functional use,FMC,Vision,Coordination,Gait,Decreased knowledge of precautions,GMC,ROM,Decreased knowledge of use of DME,Dexterity,IADL,Sensation,Tone,Body mechanics Cognitive Skills: Attention,Memory,Problem Solve,Safety Awareness,Thought,Understand     Visit Diagnosis: Hemiplegia and hemiparesis following cerebral infarction affecting left non-dominant side (HCC)  Unsteadiness on feet  Muscle weakness (generalized)  Other symptoms and signs involving the nervous system  Other disturbances of skin sensation    Problem List Patient Active Problem List   Diagnosis Date Noted  .  History of CVA (cerebrovascular accident) 01/19/2020  . Normocytic anemia   . Benign essential HTN   . Hyponatremia   . Acute blood loss anemia   . Hypotension due to drugs   . Vascular headache   . Prerenal azotemia   . Sleep disturbance   . Leukocytosis   . Transaminitis   . Marijuana abuse   . Essential hypertension   . Dysphagia, post-stroke   . Left hemiparesis ([place])   . Acute respiratory failure ([place])   . ICH (intracerebral hemorrhage) ([place]) 12/06/2019    Mariah Milling, OTR/L 10/04/2020, 4:11 PM  [facility] [address] [place], [place], [address] Phone: [phone]   Fax:  [phone]  Name: Nixon Kolton MRN: [id number] Date of Birth: [birth year]

## 2020-10-05 ENCOUNTER — Encounter: Payer: Self-pay | Admitting: Family Medicine

## 2020-10-05 ENCOUNTER — Ambulatory Visit (INDEPENDENT_AMBULATORY_CARE_PROVIDER_SITE_OTHER): Payer: Managed Care, Other (non HMO) | Admitting: Family Medicine

## 2020-10-05 VITALS — BP 118/76 | HR 60 | Temp 97.4°F | Ht 67.0 in | Wt 148.0 lb

## 2020-10-05 DIAGNOSIS — E1169 Type 2 diabetes mellitus with other specified complication: Secondary | ICD-10-CM

## 2020-10-05 DIAGNOSIS — E785 Hyperlipidemia, unspecified: Secondary | ICD-10-CM | POA: Diagnosis not present

## 2020-10-05 DIAGNOSIS — Z8673 Personal history of transient ischemic attack (TIA), and cerebral infarction without residual deficits: Secondary | ICD-10-CM | POA: Diagnosis not present

## 2020-10-05 DIAGNOSIS — I10 Essential (primary) hypertension: Secondary | ICD-10-CM

## 2020-10-05 DIAGNOSIS — E782 Mixed hyperlipidemia: Secondary | ICD-10-CM | POA: Insufficient documentation

## 2020-10-05 DIAGNOSIS — G8194 Hemiplegia, unspecified affecting left nondominant side: Secondary | ICD-10-CM | POA: Diagnosis not present

## 2020-10-05 DIAGNOSIS — R7309 Other abnormal glucose: Secondary | ICD-10-CM

## 2020-10-05 LAB — LIPID PANEL
Cholesterol: 106 mg/dL (ref 0–200)
HDL: 37.5 mg/dL — ABNORMAL LOW (ref >39.00)
LDL Cholesterol: 58 mg/dL (ref 0–99)
NonHDL: 68.57
Total CHOL/HDL Ratio: 3
Triglycerides: 55 mg/dL (ref 0.0–149.0)
VLDL: 11 mg/dL (ref 0.0–40.0)

## 2020-10-05 LAB — COMPREHENSIVE METABOLIC PANEL
ALT: 21 U/L (ref 0–53)
AST: 15 U/L (ref 0–37)
Albumin: 4.7 g/dL (ref 3.5–5.2)
Alkaline Phosphatase: 63 U/L (ref 39–117)
BUN: 16 mg/dL (ref 6–23)
CO2: 31 mEq/L (ref 19–32)
Calcium: 10 mg/dL (ref 8.4–10.5)
Chloride: 102 mEq/L (ref 96–112)
Creatinine, Ser: 1.24 mg/dL (ref 0.40–1.50)
GFR: 73.15 mL/min (ref >60.00)
Glucose, Bld: 94 mg/dL (ref 70–99)
Potassium: 4.2 mEq/L (ref 3.5–5.1)
Sodium: 140 mEq/L (ref 135–145)
Total Bilirubin: 0.5 mg/dL (ref 0.2–1.2)
Total Protein: 7.5 g/dL (ref 6.0–8.3)

## 2020-10-05 LAB — URINALYSIS, ROUTINE W REFLEX MICROSCOPIC
Bilirubin Urine: NEGATIVE
Hgb urine dipstick: NEGATIVE
Ketones, ur: NEGATIVE
Leukocytes,Ua: NEGATIVE
Nitrite: NEGATIVE
RBC / HPF: NONE SEEN (ref 0–?)
Specific Gravity, Urine: 1.02 (ref 1.000–1.030)
Total Protein, Urine: NEGATIVE
Urine Glucose: NEGATIVE
Urobilinogen, UA: 0.2 (ref 0.0–1.0)
WBC, UA: NONE SEEN (ref 0–?)
pH: 6 (ref 5.0–8.0)

## 2020-10-05 LAB — CBC
HCT: 40.8 % (ref 39.0–52.0)
Hemoglobin: 13.2 g/dL (ref 13.0–17.0)
MCHC: 32.3 g/dL (ref 30.0–36.0)
MCV: 81.7 fl (ref 78.0–100.0)
Platelets: 294 10*3/uL (ref 150.0–400.0)
RBC: 5 Mil/uL (ref 4.22–5.81)
RDW: 15.3 % (ref 11.5–15.5)
WBC: 6.6 10*3/uL (ref 4.0–10.5)

## 2020-10-05 LAB — LDL CHOLESTEROL, DIRECT: Direct LDL: 66 mg/dL

## 2020-10-05 NOTE — Progress Notes (Signed)
Established Patient Office Visit  Subjective:  Patient ID: Dennis Mercado, male    DOB: Oct 23, 1980  Age: 40 y.o. MRN: [id number]  CC:  Chief Complaint  Patient presents with  . Annual Exam    CPE, patient would like to discuss coming off Coreg.     HPI Dennis Mercado presents for follow-up of hypertension, hyperlipidemia left hemiparesis.  Patient is improving.  Using a cane instead of the walker.  Continues with OT and PT.  Has been fatigued.  Blood pressure this morning 118/76 on no medicines this morning.  Blood pressures been running in the 120s over 70s.  Neurology thinks that he will be able to discontinue Keppra soon.  Asked about the possibility of stopping the Coreg.  Denies lightheadedness when standing or exerting himself.  History reviewed. No pertinent past medical history.  Past Surgical History:  Procedure Laterality Date  . WISDOM TOOTH EXTRACTION      History reviewed. No pertinent family history.  Social History   Socioeconomic History  . Marital status: Married    Spouse name: Not on file  . Number of children: Not on file  . Years of education: Not on file  . Highest education level: Not on file  Occupational History  . Not on file  Tobacco Use  . Smoking status: Never Smoker  . Smokeless tobacco: Never Used  Vaping Use  . Vaping Use: Never used  Substance and Sexual Activity  . Alcohol use: Not Currently    Comment: several drinks over the weekend  . Drug use: Never  . Sexual activity: Not on file  Other Topics Concern  . Not on file  Social History Narrative  . Not on file   Social Determinants of Health   Financial Resource Strain: Not on file  Food Insecurity: Not on file  Transportation Needs: Not on file  Physical Activity: Not on file  Stress: Not on file  Social Connections: Not on file  Intimate Partner Violence: Not on file    Outpatient Medications Prior to Visit  Medication Sig Dispense Refill  . acetaminophen (TYLENOL)  325 MG tablet Take 2 tablets (650 mg total) by mouth every 4 (four) hours as needed for mild pain (or temp > 37.5 C (99.5 F)).    Marland Kitchen amLODipine (NORVASC) 10 MG tablet TAKE 1 TABLET(10 MG) BY MOUTH DAILY 90 tablet 0  . atorvastatin (LIPITOR) 40 MG tablet TAKE 1 TABLET(40 MG) BY MOUTH DAILY 90 tablet 0  . carvedilol (COREG) 25 MG tablet TAKE 1 TABLET(25 MG) BY MOUTH TWICE DAILY WITH A MEAL 90 tablet 0  . levETIRAcetam (KEPPRA) 250 MG tablet Take 1 tablet (250 mg total) by mouth 2 (two) times daily. 120 tablet 0  . lisinopril (ZESTRIL) 5 MG tablet TAKE 1 TABLET(5 MG) BY MOUTH DAILY 90 tablet 0  . omeprazole (PRILOSEC OTC) 20 MG tablet Take 20 mg by mouth daily.      No facility-administered medications prior to visit.    Allergies  Allergen Reactions  . Percocet [Oxycodone-Acetaminophen] Itching    Pt says he has previously tolerated.     ROS Review of Systems  Constitutional: Negative.   HENT: Negative.   Eyes: Negative for photophobia and visual disturbance.  Respiratory: Negative.   Cardiovascular: Negative.   Gastrointestinal: Negative.   Endocrine: Negative for polyphagia and polyuria.  Genitourinary: Negative.   Musculoskeletal: Positive for gait problem.  Skin: Negative for color change and pallor.  Allergic/Immunologic: Negative for immunocompromised state.  Neurological:  Positive for weakness. Negative for seizures, speech difficulty and light-headedness.  Hematological: Does not bruise/bleed easily.  Psychiatric/Behavioral: Negative.       Objective:    Physical Exam Vitals and nursing note reviewed.  Constitutional:      General: He is not in acute distress.    Appearance: Normal appearance. He is normal weight. He is not ill-appearing, toxic-appearing or diaphoretic.  HENT:     Head: Normocephalic and atraumatic.     Right Ear: Tympanic membrane, ear canal and external ear normal.     Left Ear: Tympanic membrane, ear canal and external ear normal.      Mouth/Throat:     Mouth: Mucous membranes are moist.     Pharynx: Oropharynx is clear. No oropharyngeal exudate or posterior oropharyngeal erythema.  Eyes:     General: No scleral icterus.       Right eye: No discharge.        Left eye: No discharge.     Conjunctiva/sclera: Conjunctivae normal.     Pupils: Pupils are equal, round, and reactive to light.  Cardiovascular:     Rate and Rhythm: Normal rate and regular rhythm.  Pulmonary:     Effort: Pulmonary effort is normal.     Breath sounds: Normal breath sounds.  Abdominal:     General: Bowel sounds are normal.     Tenderness: There is no abdominal tenderness.  Musculoskeletal:     Cervical back: No rigidity or tenderness.  Lymphadenopathy:     Cervical: No cervical adenopathy.  Skin:    General: Skin is warm and dry.  Neurological:     Mental Status: He is alert and oriented to person, place, and time.     Motor: Weakness present. No tremor.     Comments: Left hemiparesis persist with improving rom and strength.   Psychiatric:        Mood and Affect: Mood normal.        Behavior: Behavior normal.     BP 118/76   Pulse 60   Temp (!) 97.4 F (36.3 C) (Temporal)   Ht 5\' 7"  (1.702 m)   Wt 148 lb (67.1 kg)   SpO2 98%   BMI 23.18 kg/m  Wt Readings from Last 3 Encounters:  10/05/20 148 lb (67.1 kg)  10/03/20 150 lb (68 kg)  09/05/20 149 lb (67.6 kg)     Health Maintenance Due  Topic Date Due  . Hepatitis C Screening  Never done  . PNEUMOCOCCAL POLYSACCHARIDE VACCINE AGE 69-64 HIGH RISK  Never done  . FOOT EXAM  Never done  . OPHTHALMOLOGY EXAM  Never done  . HEMOGLOBIN A1C  06/06/2020    There are no preventive care reminders to display for this patient.  No results found for: TSH Lab Results  Component Value Date   WBC 7.9 03/28/2020   HGB 13.0 03/28/2020   HCT 40.2 03/28/2020   MCV 82.3 03/28/2020   PLT 373.0 03/28/2020   Lab Results  Component Value Date   NA 139 03/28/2020   K 4.6 03/28/2020    CO2 31 03/28/2020   GLUCOSE 88 03/28/2020   BUN 16 03/28/2020   CREATININE 1.33 03/28/2020   BILITOT 0.6 03/28/2020   ALKPHOS 79 03/28/2020   AST 12 03/28/2020   ALT 18 03/28/2020   PROT 7.6 03/28/2020   ALBUMIN 4.9 03/28/2020   CALCIUM 10.4 03/28/2020   ANIONGAP 10 01/10/2020   GFR 66.79 03/28/2020   Lab Results  Component Value Date  CHOL 115 03/28/2020   Lab Results  Component Value Date   HDL 32.10 (L) 03/28/2020   Lab Results  Component Value Date   LDLCALC 66 03/28/2020   Lab Results  Component Value Date   TRIG 84.0 03/28/2020   Lab Results  Component Value Date   CHOLHDL 4 03/28/2020   Lab Results  Component Value Date   HGBA1C 5.6 12/06/2019      Assessment & Plan:   Problem List Items Addressed This Visit      Cardiovascular and Mediastinum   Essential hypertension - Primary   Relevant Orders   CBC   Comprehensive metabolic panel   Urinalysis, Routine w reflex microscopic     Endocrine   Hyperlipidemia associated with type 2 diabetes mellitus ([place])   Relevant Orders   LDL cholesterol, direct   Lipid panel     Nervous and Auditory   Left hemiparesis (HCC)     Other   History of CVA (cerebrovascular accident)      No orders of the defined types were placed in this encounter.   Follow-up: Return in about 3 months (around 01/04/2021), or one coreg daily for a week then every other day for a week and stop. check bps..  Tapering Coreg over 2 weeks as indicated above.  Hopefully will be able to stop the Keppra soon.  Continue OT and PT.  Follow-up in 3 months. Discussed moderate etoh use.   Libby Maw, MD

## 2020-10-05 NOTE — Therapy (Addendum)
[facility] [address] [place], [place], [address] Phone: [phone]   Fax:  [phone]  Physical Therapy Treatment  Patient Details  Name: Dennis Mercado MRN: [id number] Date of Birth: [birth year] Referring Provider (PT): Letta Pate [name], MD   Encounter Date: 10/04/2020   PT End of Session - 10/05/20 1003    Visit Number 8    Number of Visits 17   visits dependent on pt's co pay, written for 1-2x week for 8 weeks   Authorization Type VL: 60 visits PT, 30 OT    Authorization - Visit Number 7    Authorization - Number of Visits 60    PT Start Time 1448    PT Stop Time 1530    PT Time Calculation (min) 42 min    Equipment Utilized During Treatment Gait belt    Activity Tolerance Patient tolerated treatment well    Behavior During Therapy [facility] for tasks assessed/performed           No past medical history on file.  Past Surgical History:  Procedure Laterality Date  . WISDOM TOOTH EXTRACTION      There were no vitals filed for this visit.   Subjective Assessment - 10/04/20 1451    Subjective No changes since he was last here. Frann Rider reduced his seizure medication and will stop at the end of June. Brought in his 3 prong hurry cane today.    Pertinent History HTN    Limitations Walking    Patient Stated Goals wants to work on not walking with the cane    Currently in Pain? No/denies                             [facility] Adult PT Treatment/Exercise - 10/04/20 1506      Ambulation/Gait   Ambulation/Gait Yes    Ambulation/Gait Assistance 5: Supervision    Ambulation/Gait Assistance Details between activities with [place] with 3 prong tip (pt brought in hurry cane from home)    Assistive device Straight cane   3 prong tip   Gait Pattern Step-through pattern;Decreased step length - left;Decreased stance time - left;Decreased hip/knee flexion - left;Decreased arm swing - left    Ambulation  Surface Level;Indoor      Neuro Re-ed    Neuro Re-ed Details  tall kneeling (needing min A and verbal cues on how to get into position turning and getting onto mat table): reaching towards cones with RUE towards L side for incr weight shift towards LLE (PT student assisting holding cones), grabbing cone and placing on R and then picking cone back up and reaching to give back to PT student - performed approx. 15-20 reps with min A at times from therapist for balance and pt needing UE support at times, with use of mirror for midline orientation: hip extension mini squats x5 reps, then x10 reps with green tband resistance (tactile cue over L ASIS to push into band), 2 x 5 reps RLE hip ABD in and out for closed chain L hip ABD strength and incr weight shift                 10/04/20 0001  Balance Exercises: Standing  SLS Eyes open;Modified;Limitations  SLS Limitations taps to 4" step with RLE beginning with single UE support and then none x15 reps - cues for posture and quad/hip extension on LLE,  then keeping LLE in stance and RLE  on step with fingertip support 2 x 5 reps head nods, 2 x 5 reps head turns  Rockerboard Anterior/posterior;Lateral;Head turns;EO;Intermittent UE support;Limitations  Rockerboard Limitations step ups with LLE on rocker board x10 reps with cane for RUE support for balance        PT Short Term Goals - 09/19/20 1153      PT SHORT TERM GOAL #1   Title Pt will undergo further assessment of BERG with LTG written as appropriate. ALL STGS DUE 09/19/20.    Baseline 45/56 on 08/22/20, LTG revised    Time 5   due to delay in scheduling   Period Weeks    Status Achieved    Target Date 09/19/20      PT SHORT TERM GOAL #2   Title Pt will perform TUG in 20 seconds or less with SBQC in order to demo decr fall risk.    Baseline 23.91 seconds; 23.82 secs with SPC with quad tip    Time 5    Period Weeks    Status Partially Met      PT SHORT TERM GOAL #3   Title Pt will  improve gait speed with SBQC to at least 1.6 ft/sec in order to demo decr fall risk.    Baseline 1.35 ft/sec; 1.39 ft/sec with SPC with quad tip    Time 5    Period Weeks    Status Partially Met      PT SHORT TERM GOAL #4   Title Pt will perform 4 steps with use of railing and or SBQC with supervision in order to demo improved community access.    Baseline 8 stairs with single rail on R and supervision    Time 5    Period Weeks    Status Achieved      PT SHORT TERM GOAL #5   Title Pt will perform 5x sit <> stand in 20 seconds or less with no UE support from chair in order to demo decr fall risk.    Baseline 22.85 seconds; 18.76 secs    Time 5    Period Weeks    Status Achieved             PT Long Term Goals - 08/22/20 1646      PT LONG TERM GOAL #1   Title Pt and pt's spouse will be independent with final HEP in order to build upon functional gains made in therapy. ALL LTGS DUE 10/17/20    Time 9   due to delay in scheduling   Period Weeks    Status New      PT LONG TERM GOAL #2   Title Pt will ambulate 17' with no AD vs. LRAD over level indoor surfaces with supervision in order to demo improved household mobility.    Time 9    Period Weeks    Status New      PT LONG TERM GOAL #3   Title Pt will perform TUG in 18 seconds or less with LRAD in order to demo decr fall risk.    Baseline 23.91 seconds    Time 9    Period Weeks    Status New      PT LONG TERM GOAL #4   Title Pt will perform 5x sit <> stand in 17 seconds or less with no UE support from chair in order to demo decr fall risk.    Baseline 22.85 seconds    Time 9    Period Weeks  Status New      PT LONG TERM GOAL #5   Title Pt will improve BERG to at least a 48/56 in order to demo decr fall risk.    Baseline 45/56    Time 9    Period Weeks    Status Revised                 Plan - 10/05/20 1217    Clinical Impression Statement Today's skilled session focused on strengthening for LLE and NMR  activities for SLS and incr weight shifting towards LLE. Pt challenged by tall kneeling position today when shifting more weight towards LLE when reaching outside of BOS with RUE. Pt with improved SLS stance control with LLE with RLE taps to 4" step with incr reps. Will continue to progress towards LTGs.    Personal Factors and Comorbidities Comorbidity 1;Past/Current Experience;Profession    Comorbidities HTN    Examination-Activity Limitations Stand;Stairs;Squat;Transfers;Locomotion Level    Examination-Participation Restrictions Community Activity;Occupation   walking his dog   Stability/Clinical Decision Making Stable/Uncomplicated    Rehab Potential Good    PT Frequency 1x / week   1-2   PT Duration 8 weeks    PT Treatment/Interventions ADLs/Self Care Home Management;Aquatic Therapy;Electrical Stimulation;DME Instruction;Gait training;Stair training;Functional mobility training;Therapeutic activities;Therapeutic exercise;Balance training;Neuromuscular re-education;Wheelchair mobility training;Orthotic Fit/Training;Patient/family education;Passive range of motion;Energy conservation;Vestibular    PT Next Visit Plan getting up from lower surfaces to simulate getting up from a beach chair. balance strategies on different surfaces/decr UE support, NMR/strengthening for LLE. SLS on LLE. Try tall kneeling.    PT Home Exercise Plan [id number]    Consulted and Agree with Plan of Care Patient           Patient will benefit from skilled therapeutic intervention in order to improve the following deficits and impairments:  Abnormal gait,Decreased activity tolerance,Decreased balance,Decreased mobility,Decreased knowledge of use of DME,Decreased coordination,Decreased range of motion,Decreased strength,Difficulty walking,Impaired tone,Impaired sensation,Postural dysfunction  Visit Diagnosis: Hemiplegia and hemiparesis following cerebral infarction affecting left non-dominant side (HCC)  Unsteadiness on  feet  Muscle weakness (generalized)  Other symptoms and signs involving the nervous system     Problem List Patient Active Problem List   Diagnosis Date Noted  . Hyperlipidemia associated with type 2 diabetes mellitus ([place]) 10/05/2020  . History of CVA (cerebrovascular accident) 01/19/2020  . Normocytic anemia   . Benign essential HTN   . Hyponatremia   . Acute blood loss anemia   . Hypotension due to drugs   . Vascular headache   . Prerenal azotemia   . Sleep disturbance   . Leukocytosis   . Transaminitis   . Marijuana abuse   . Essential hypertension   . Dysphagia, post-stroke   . Left hemiparesis ([place])   . Acute respiratory failure ([place])   . ICH (intracerebral hemorrhage) ([place]) 12/06/2019    Arliss Journey, PT, DPT  10/05/2020, 12:19 PM  [facility] [address] [place], [place], [address] Phone: [phone]   Fax:  [phone]  Name: Khiem Gargis MRN: [id number] Date of Birth: 07-30-1980

## 2020-10-09 ENCOUNTER — Ambulatory Visit: Payer: Managed Care, Other (non HMO) | Admitting: Occupational Therapy

## 2020-10-09 NOTE — Progress Notes (Signed)
I agree with the above plan 

## 2020-10-10 ENCOUNTER — Encounter: Payer: Managed Care, Other (non HMO) | Admitting: Physical Medicine & Rehabilitation

## 2020-10-11 ENCOUNTER — Encounter: Payer: Self-pay | Admitting: Physical Therapy

## 2020-10-11 ENCOUNTER — Telehealth: Payer: Self-pay | Admitting: Family Medicine

## 2020-10-11 ENCOUNTER — Ambulatory Visit: Payer: Managed Care, Other (non HMO) | Admitting: Occupational Therapy

## 2020-10-11 ENCOUNTER — Other Ambulatory Visit: Payer: Self-pay

## 2020-10-11 ENCOUNTER — Ambulatory Visit: Payer: Managed Care, Other (non HMO) | Admitting: Physical Therapy

## 2020-10-11 DIAGNOSIS — R2689 Other abnormalities of gait and mobility: Secondary | ICD-10-CM | POA: Diagnosis not present

## 2020-10-11 DIAGNOSIS — R2681 Unsteadiness on feet: Secondary | ICD-10-CM

## 2020-10-11 DIAGNOSIS — M6281 Muscle weakness (generalized): Secondary | ICD-10-CM

## 2020-10-11 DIAGNOSIS — Z0000 Encounter for general adult medical examination without abnormal findings: Secondary | ICD-10-CM

## 2020-10-11 DIAGNOSIS — R29818 Other symptoms and signs involving the nervous system: Secondary | ICD-10-CM

## 2020-10-11 DIAGNOSIS — I69354 Hemiplegia and hemiparesis following cerebral infarction affecting left non-dominant side: Secondary | ICD-10-CM

## 2020-10-11 NOTE — Therapy (Signed)
[facility] [address] [place], [place], [address] Phone: [phone]   Fax:  [phone]  Physical Therapy Treatment/Re-Cert  Patient Details  Name: Dennis Mercado MRN: [id number] Date of Birth: Mar 27, 1981 Referring Provider (PT): [name], MD   Encounter Date: 10/11/2020   PT End of Session - 10/11/20 1619    Visit Number 9    Number of Visits 17    Authorization Type VL: 14 visits PT, 30 OT    Authorization - Visit Number 8    Authorization - Number of Visits 60    PT Start Time 7741    PT Stop Time 1446    PT Time Calculation (min) 41 min    Equipment Utilized During Treatment Gait belt    Activity Tolerance Patient tolerated treatment well    Behavior During Therapy [facility] for tasks assessed/performed           History reviewed. No pertinent past medical history.  Past Surgical History:  Procedure Laterality Date  . WISDOM TOOTH EXTRACTION      There were no vitals filed for this visit.   Subjective Assessment - 10/11/20 1408    Subjective Not going to the beach anymore, its going to be too cold.    Pertinent History HTN    Limitations Walking    Patient Stated Goals wants to work on not walking with the cane    Currently in Pain? No/denies              [facility] PT Assessment - 10/11/20 1412      Assessment   Medical Diagnosis R thalamic hemorrhage w/ L hemiparesis    Referring Provider (PT) Kirsteins, [name], MD      Precautions   Precautions Fall      Prior Function   Level of Independence Independent      Berg Balance Test   Sit to Stand Able to stand without using hands and stabilize independently    Standing Unsupported Able to stand safely 2 minutes    Sitting with Back Unsupported but Feet Supported on Floor or Stool Able to sit safely and securely 2 minutes    Stand to Sit Sits safely with minimal use of hands    Transfers Able to transfer safely, minor use of hands     Standing Unsupported with Eyes Closed Able to stand 10 seconds safely    Standing Unsupported with Feet Together Able to place feet together independently and stand 1 minute safely    From Standing, Reach Forward with Outstretched Arm Can reach confidently >25 cm (10")    From Standing Position, Pick up Object from Floor Able to pick up shoe safely and easily    From Standing Position, Turn to Look Behind Over each Shoulder Looks behind from both sides and weight shifts well    Turn 360 Degrees Able to turn 360 degrees safely but slowly   9.16 to R, 6.56 to L   Standing Unsupported, Alternately Place Feet on Step/Stool Needs assistance to keep from falling or unable to try    Standing Unsupported, One Foot in Front Able to plae foot ahead of the other independently and hold 30 seconds    Standing on One Leg Tries to lift leg/unable to hold 3 seconds but remains standing independently    Total Score 46    Berg comment: 46/56      Timed Up and Go Test   Normal TUG (seconds) 22.79  TUG Comments with hurry cane                         [facility] Adult PT Treatment/Exercise - 10/11/20 1412      Transfers   Transfers Sit to Stand;Stand to Sit    Sit to Stand 5: Supervision    Five time sit to stand comments  15.56 seconds with no UE support from standard height table with feet apart    Stand to Sit 5: Supervision      Ambulation/Gait   Ambulation/Gait Yes    Ambulation/Gait Assistance 5: Supervision    Assistive device --   hurry cane   Gait Pattern Step-through pattern;Decreased step length - left;Decreased stance time - left;Decreased hip/knee flexion - left;Decreased arm swing - left    Ambulation Surface Level;Indoor    Gait velocity 24.22 seconds = 1.35 ft/sec               Balance Exercises - 10/11/20 1623      Balance Exercises: Standing   Stepping Strategy Posterior;Lateral;Foam/compliant surface    Stepping Strategy Limitations standing on blue air ex  with LLE as stance leg,  lateral stepping off RLE x12 reps, then stepping RLE off posteriorly x12 reps - - beginning with UE support and then none    Retro Gait 4 reps;Upper extremity support;Limitations    Retro Gait Limitations in // bars - verbal and tactile cues for incr glute activation with LLE and therapist preventing pelvis from rotating, pt with improvement in step quality    Other Standing Exercises with RLE on colorful floor bubble for incr weight bearing/shifting towards LLE x5 reps sit <> stands, needing min guard for balance and UE support to stand, cues for weight shifting towards L and slowed and controlled to sit, then performed an additional x5 reps with red beam under RLE due to floor bubble kept sliding around    Other Standing Exercises Comments small distance gait 4 x 10' in // bars with no UE support, assist for incr weight shift to LLE             PT Education - 10/11/20 1618    Education Details progress towards goals, re-cert for an additional 1x week for 8 weeks    Person(s) Educated Patient    Methods Explanation    Comprehension Verbalized understanding            PT Short Term Goals - 09/19/20 1153      PT SHORT TERM GOAL #1   Title Pt will undergo further assessment of BERG with LTG written as appropriate. ALL STGS DUE 09/19/20.    Baseline 45/56 on 08/22/20, LTG revised    Time 5   due to delay in scheduling   Period Weeks    Status Achieved    Target Date 09/19/20      PT SHORT TERM GOAL #2   Title Pt will perform TUG in 20 seconds or less with SBQC in order to demo decr fall risk.    Baseline 23.91 seconds; 23.82 secs with SPC with quad tip    Time 5    Period Weeks    Status Partially Met      PT SHORT TERM GOAL #3   Title Pt will improve gait speed with SBQC to at least 1.6 ft/sec in order to demo decr fall risk.    Baseline 1.35 ft/sec; 1.39 ft/sec with SPC with quad tip  Time 5    Period Weeks    Status Partially Met      PT SHORT TERM  GOAL #4   Title Pt will perform 4 steps with use of railing and or SBQC with supervision in order to demo improved community access.    Baseline 8 stairs with single rail on R and supervision    Time 5    Period Weeks    Status Achieved      PT SHORT TERM GOAL #5   Title Pt will perform 5x sit <> stand in 20 seconds or less with no UE support from chair in order to demo decr fall risk.    Baseline 22.85 seconds; 18.76 secs    Time 5    Period Weeks    Status Achieved          Revised STGs:  PT Short Term Goals - 10/11/20 1651      PT SHORT TERM GOAL #1   Title Pt will ambulate at least 500' outdoors with hurry cane over paved surfaces and perform a curb with supervision in order to demo improved community mobility. ALL STGS DUE 11/08/20    Time 4    Period Weeks    Status New    Target Date 11/08/20      PT SHORT TERM GOAL #2   Title Pt will improve gait speed to at least 1.5 ft/sec with hurry cane in order to demo decr fall risk.    Baseline 1.35 ft/sec on 10/11/20    Time 4    Period Weeks    Status New      PT SHORT TERM GOAL #3   Title Pt will perform 4 steps with use of cane with supervision in order to demo improved community access.    Time 4    Period Weeks    Status New             PT Long Term Goals - 10/11/20 1411      PT LONG TERM GOAL #1   Title Pt and pt's spouse will be independent with final HEP in order to build upon functional gains made in therapy. ALL LTGS DUE 10/17/20    Baseline pt reports independence with HEP - will benefit from continued additions    Time 9   due to delay in scheduling   Period Weeks    Status New      PT LONG TERM GOAL #2   Title Pt will ambulate 37' with no AD vs. LRAD over level indoor surfaces with supervision in order to demo improved household mobility.    Baseline met - with SPC with 3 prong tip (hurry cane)    Time 9    Period Weeks    Status Achieved      PT LONG TERM GOAL #3   Title Pt will perform TUG in  18 seconds or less with LRAD in order to demo decr fall risk.    Baseline 23.91 seconds; 22.79 seconds with hurry cane    Time 9    Period Weeks    Status Not Met      PT LONG TERM GOAL #4   Title Pt will perform 5x sit <> stand in 17 seconds or less with no UE support from chair in order to demo decr fall risk.    Baseline 22.85 seconds; 15.56 seconds on 10/11/20    Time 9    Period Weeks    Status Achieved  PT LONG TERM GOAL #5   Title Pt will improve BERG to at least a 48/56 in order to demo decr fall risk.    Baseline 45/56; 46/56    Time 9    Period Weeks    Status Not Met           Revised LTGs:  PT Long Term Goals - 10/11/20 1655      PT LONG TERM GOAL #1   Title Pt and pt's spouse will be independent with final HEP in order to build upon functional gains made in therapy. ALL LTGS DUE 12/06/20    Baseline pt reports independence with HEP - will benefit from continued additions    Time 8    Period Weeks    Status New    Target Date 12/06/20      PT LONG TERM GOAL #2   Title Pt will ambulate small distances of 20-30' with no AD and supervision while holding a plate in order for pt to ambulate from his kitchen to the dining room safely.    Time 8    Period Weeks    Status New      PT LONG TERM GOAL #3   Title Pt will perform TUG in 19 seconds or less with LRAD in order to demo decr fall risk.    Baseline 23.91 seconds; 22.79 seconds with hurry cane    Time 8    Period Weeks    Status Revised      PT LONG TERM GOAL #4   Title Pt will ambulate outdoors over grass surfaces with supervision with LRAD  in order to demo improved community access and balance in his yard.    Time 8    Period Weeks    Status New      PT LONG TERM GOAL #5   Title Pt will improve gait speed to at least 1.6 ft/sec with hurry cane in order to demo decr fall risk.    Baseline 1.35 ft/sec    Time 8    Period Weeks    Status New                Plan - 10/11/20 1648     Clinical Impression Statement Today's skilled session focused on assessing pt's LTGs for re-cert. Pt met LTG #1-2, and 4. Pt able to ambulate 115' with hurry cane indoors with supervision (previously used [facility]). Pt improved 5x sit <> stand with no UE support to 15.56 seconds (previously ~23 seconds). Pt did not meet LTG #3 and 5. Pt performed TUG in 22.79 seconds with hurry cane (previously 23.91 seconds with SBQC). Pt only improved BERG by 1 point - continues with difficulty with SLS activities and turns that lowers pt's score. Will re-cert for an additional 1x week for 8 weeks to continue to work on impairments below in order to [place] fall risk and improve pt's functional mobility and independence.    Personal Factors and Comorbidities Comorbidity 1;Past/Current Experience;Profession    Comorbidities HTN    Examination-Activity Limitations Stand;Stairs;Squat;Transfers;Locomotion Level    Examination-Participation Restrictions Community Activity;Occupation   walking his dog   Stability/Clinical Decision Making Stable/Uncomplicated    Rehab Potential Good    PT Frequency 1x / week    PT Duration 8 weeks    PT Treatment/Interventions ADLs/Self Care Home Management;Aquatic Therapy;Electrical Stimulation;DME Instruction;Gait training;Stair training;Functional mobility training;Therapeutic activities;Therapeutic exercise;Balance training;Neuromuscular re-education;Wheelchair mobility training;Orthotic Fit/Training;Patient/family education;Passive range of motion;Energy conservation;Vestibular    PT Next Visit Plan  pt needs to schedule for more appts - wants to coordinate with OT. balance strategies on different surfaces/decr UE support, NMR/strengthening for LLE. SLS on LLE. continue tall kneeling. stretches for incr tone in RLE.    PT Home Exercise Plan [id number]    Consulted and Agree with Plan of Care Patient           Patient will benefit from skilled therapeutic intervention in order to improve the  following deficits and impairments:  Abnormal gait,Decreased activity tolerance,Decreased balance,Decreased mobility,Decreased knowledge of use of DME,Decreased coordination,Decreased range of motion,Decreased strength,Difficulty walking,Impaired tone,Impaired sensation,Postural dysfunction  Visit Diagnosis: Hemiplegia and hemiparesis following cerebral infarction affecting left non-dominant side (HCC)  Unsteadiness on feet  Muscle weakness (generalized)  Other symptoms and signs involving the nervous system     Problem List Patient Active Problem List   Diagnosis Date Noted  . Hyperlipidemia associated with type 2 diabetes mellitus ([place]) 10/05/2020  . History of CVA (cerebrovascular accident) 01/19/2020  . Normocytic anemia   . Benign essential HTN   . Hyponatremia   . Acute blood loss anemia   . Hypotension due to drugs   . Vascular headache   . Prerenal azotemia   . Sleep disturbance   . Leukocytosis   . Transaminitis   . Marijuana abuse   . Essential hypertension   . Dysphagia, post-stroke   . Left hemiparesis ([place])   . Acute respiratory failure ([place])   . ICH (intracerebral hemorrhage) ([place]) 12/06/2019    Arliss Journey, PT, DPT  10/11/2020, 4:49 PM  [facility] [address] [place], [place], [address] Phone: [phone]   Fax:  [phone]  Name: Nikolay Demetriou MRN: [id number] Date of Birth: 1981/01/26

## 2020-10-11 NOTE — Telephone Encounter (Signed)
Pt is wanting a cb concerning his most recent A1C reading. Please advise at [phone].

## 2020-10-11 NOTE — Telephone Encounter (Signed)
Patient came in for CPE last week had labs drawn and form fill out for work. No A1C was drawn or checked. Explained to patient that there was no need for this to be checked due to normal glucose levels and also no history for diabetes. Per patient money will be deducted from pay check if this test is not checked and filled out on form to be faxed to job. Is it okay for patient to come in for hemoglobin A1C check only? Please advise

## 2020-10-12 ENCOUNTER — Ambulatory Visit (INDEPENDENT_AMBULATORY_CARE_PROVIDER_SITE_OTHER): Payer: Managed Care, Other (non HMO)

## 2020-10-12 DIAGNOSIS — Z0000 Encounter for general adult medical examination without abnormal findings: Secondary | ICD-10-CM

## 2020-10-12 LAB — POCT GLYCOSYLATED HEMOGLOBIN (HGB A1C): Hemoglobin A1C: 5.9 % — AB (ref 4.0–5.6)

## 2020-10-12 NOTE — Telephone Encounter (Signed)
Test ordered

## 2020-10-12 NOTE — Progress Notes (Signed)
Patient here for finger stick Hgb A1c only.

## 2020-10-13 DIAGNOSIS — R7309 Other abnormal glucose: Secondary | ICD-10-CM | POA: Insufficient documentation

## 2020-10-13 NOTE — Addendum Note (Signed)
Addended by: Jon Billings on: 10/13/2020 09:24 AM   Modules accepted: Orders

## 2020-10-16 ENCOUNTER — Encounter: Payer: Self-pay | Admitting: Occupational Therapy

## 2020-10-16 ENCOUNTER — Ambulatory Visit: Payer: Managed Care, Other (non HMO) | Attending: Physician Assistant | Admitting: Occupational Therapy

## 2020-10-16 DIAGNOSIS — R2689 Other abnormalities of gait and mobility: Secondary | ICD-10-CM | POA: Diagnosis present

## 2020-10-16 DIAGNOSIS — I69354 Hemiplegia and hemiparesis following cerebral infarction affecting left non-dominant side: Secondary | ICD-10-CM | POA: Diagnosis present

## 2020-10-16 DIAGNOSIS — R29818 Other symptoms and signs involving the nervous system: Secondary | ICD-10-CM | POA: Diagnosis present

## 2020-10-16 DIAGNOSIS — R2681 Unsteadiness on feet: Secondary | ICD-10-CM | POA: Diagnosis present

## 2020-10-16 DIAGNOSIS — R208 Other disturbances of skin sensation: Secondary | ICD-10-CM | POA: Insufficient documentation

## 2020-10-16 DIAGNOSIS — M6281 Muscle weakness (generalized): Secondary | ICD-10-CM | POA: Insufficient documentation

## 2020-10-16 NOTE — Therapy (Signed)
[facility] [address] [place], [place], [address] Phone: [phone]   Fax:  [phone]  Occupational Therapy Treatment  Patient Details  Name: Dennis Mercado MRN: [id number] Date of Birth: 05-30-1981 Referring Provider (OT): Dr. Letta Pate   Encounter Date: 10/16/2020   OT End of Session - 10/16/20 1858    Visit Number 11    Number of Visits 16    Date for OT Re-Evaluation 11/24/20    Authorization - Visit Number 11    Authorization - Number of Visits 16    OT Start Time 6606    OT Stop Time 1500    OT Time Calculation (min) 45 min    Equipment Utilized During Treatment floatation equipment    Activity Tolerance Patient tolerated treatment well    Behavior During Therapy [facility] for tasks assessed/performed           History reviewed. No pertinent past medical history.  Past Surgical History:  Procedure Laterality Date  . WISDOM TOOTH EXTRACTION      There were no vitals filed for this visit.   Subjective Assessment - 10/16/20 1857    Subjective  I did not get the botox this time    Patient is accompanied by: Family member    Pertinent History Pt. presented to ED on 12/06/2019 with left side weakness and  altered mental status s/p coitus.  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 Pt required intubation 6/22 due to ARF secondary to aspiration PNA. Received inpatient rehab. Pt was discharged 7/28/2    Limitations HTN, left inattention, apraxia    Patient Stated Goals to be able to use my hand    Currently in Pain? No/denies    Pain Score 0-No pain          Patient seen for aquatic therapy visit.  Patient entered and exited the water via steps and min assist to utilize a step through pattern.  Worked to improve passive to active range of motion and decrease excessive co-activation around shoulder which locks shoulder  motion to trunk motion. In standing worked on forward reaching patterns in flexion and scaption with increased emphasis on elbow extension.  Used hand rails at high stairs to allow increased stretch to [employer] joint into flexion, and activation by stretching with and without LE support,and  modified pull ups.                          OT Short Term Goals - 10/16/20 1901      OT SHORT TERM GOAL #1   Title I with inital HEP    Time 4    Period Weeks    Status Achieved      OT SHORT TERM GOAL #2   Title Patient will demonstrate low reach to 30 degrees of shoulder flexion with min assist    Time 4    Period Weeks    Status Achieved      OT SHORT TERM GOAL #3   Title Patient will demonstrate 30 degrees of active assited wrist and forearm motion    Time 4    Period Weeks    Status Achieved      OT SHORT TERM GOAL #4   Title xxx      OT SHORT TERM GOAL #5   Title xxx  OT Long Term Goals - 10/16/20 1901      OT LONG TERM GOAL #1   Title I with aquatic HEP    Time 8    Period Weeks    Status On-going      OT LONG TERM GOAL #2   Title Patient will demonstrate low reach to 45 degrees of shoulder flexion with elbow extension    Time 8    Period Weeks    Status On-going      OT LONG TERM GOAL #3   Title Patient will demonstrate no pain with passive range of motion to 120 shoulder flexion/abduction    Time 8    Period Weeks    Status Achieved      OT LONG TERM GOAL #4   Title xxx      OT LONG TERM GOAL #5   Title xxx      OT LONG TERM GOAL #6   Title xxx                 Plan - 10/16/20 1858    Clinical Impression Statement Patient with significant UE tightness at the beginning of this session - responds well to water treatment - need to ensure lasting impact.    OT Occupational Profile and History Detailed Assessment- Review of Records and additional review of physical, cognitive, psychosocial history related to current functional  performance    Occupational performance deficits (Please refer to evaluation for details): ADL's;IADL's;Leisure;Social Participation;Work;Play    Body Structure / Function / Physical Skills ADL;Balance;Endurance;Mobility;Strength;UE functional use;FMC;Vision;Coordination;Gait;Decreased knowledge of precautions;GMC;ROM;Decreased knowledge of use of DME;Dexterity;IADL;Sensation;Tone;Body Banker;Safety Awareness;Thought;Understand    Rehab Potential Good    Clinical Decision Making Limited treatment options, no task modification necessary    Comorbidities Affecting Occupational Performance: May have comorbidities impacting occupational performance    Modification or Assistance to Complete Evaluation  No modification of tasks or assist necessary to complete eval    OT Frequency 2x / week    OT Duration 8 weeks    OT Treatment/Interventions Self-care/ADL training;Therapeutic exercise;Functional Mobility Training;Balance training;Splinting;Manual Therapy;Neuromuscular education;Aquatic Therapy;Ultrasound;Energy conservation;Therapeutic activities;Cryotherapy;Paraffin;DME and/or AE instruction;Cognitive remediation/compensation;Visual/perceptual remediation/compensation;Gait Training;Fluidtherapy;Electrical Stimulation;Moist Heat;Contrast Bath;Passive range of motion;Patient/family education    Plan aquatic therapy and clinic therapy to address spasticity, need left trunk lengthening/right trunk shortening, realignment of shoulder girdle, and isolated glenohumeral joint motion    Consulted and Agree with Plan of Care Patient           Patient will benefit from skilled therapeutic intervention in order to improve the following deficits and impairments:   Body Structure / Function / Physical Skills: ADL,Balance,Endurance,Mobility,Strength,UE functional use,FMC,Vision,Coordination,Gait,Decreased knowledge of precautions,GMC,ROM,Decreased knowledge of use  of DME,Dexterity,IADL,Sensation,Tone,Body mechanics Cognitive Skills: Attention,Memory,Problem Solve,Safety Awareness,Thought,Understand     Visit Diagnosis: Hemiplegia and hemiparesis following cerebral infarction affecting left non-dominant side (HCC)  Unsteadiness on feet  Muscle weakness (generalized)  Other symptoms and signs involving the nervous system  Other disturbances of skin sensation    Problem List Patient Active Problem List   Diagnosis Date Noted  . Elevated glucose 10/13/2020  . Hyperlipidemia associated with type 2 diabetes mellitus ([place]) 10/05/2020  . History of CVA (cerebrovascular accident) 01/19/2020  . Normocytic anemia   . Benign essential HTN   . Hyponatremia   . Acute blood loss anemia   . Hypotension due to drugs   . Vascular headache   . Prerenal azotemia   . Sleep disturbance   . Leukocytosis   .  Transaminitis   . Marijuana abuse   . Essential hypertension   . Dysphagia, post-stroke   . Left hemiparesis ([place])   . Acute respiratory failure ([place])   . ICH (intracerebral hemorrhage) ([place]) 12/06/2019    Mariah Milling, OTR/L 10/16/2020, 7:03 PM  [facility] [address] [place], [place], [address] Phone: [phone]   Fax:  [phone]  Name: Dennis Mercado MRN: [id number] Date of Birth: Oct 02, 1980

## 2020-10-18 ENCOUNTER — Ambulatory Visit: Payer: Managed Care, Other (non HMO)

## 2020-10-18 ENCOUNTER — Other Ambulatory Visit: Payer: Self-pay

## 2020-10-18 ENCOUNTER — Ambulatory Visit: Payer: Managed Care, Other (non HMO) | Admitting: Occupational Therapy

## 2020-10-18 DIAGNOSIS — M6281 Muscle weakness (generalized): Secondary | ICD-10-CM

## 2020-10-18 DIAGNOSIS — R2689 Other abnormalities of gait and mobility: Secondary | ICD-10-CM

## 2020-10-18 DIAGNOSIS — R2681 Unsteadiness on feet: Secondary | ICD-10-CM

## 2020-10-18 DIAGNOSIS — I69354 Hemiplegia and hemiparesis following cerebral infarction affecting left non-dominant side: Secondary | ICD-10-CM | POA: Diagnosis not present

## 2020-10-18 NOTE — Therapy (Signed)
[facility] [address] [place], [place], [address] Phone: [phone]   Fax:  [phone]  Physical Therapy Treatment  Patient Details  Name: Dennis Mercado MRN: [id number] Date of Birth: Jan 02, 1981 Referring Provider (PT): [name], MD   Encounter Date: 10/18/2020   PT End of Session - 10/18/20 1446    Visit Number 10    Number of Visits 17    Authorization Type VL: 71 visits PT, 30 OT    Authorization - Visit Number 8    Authorization - Number of Visits 60    PT Start Time 7322    PT Stop Time 1530    PT Time Calculation (min) 44 min    Equipment Utilized During Treatment Gait belt    Activity Tolerance Patient tolerated treatment well    Behavior During Therapy [facility] for tasks assessed/performed           History reviewed. No pertinent past medical history.  Past Surgical History:  Procedure Laterality Date  . WISDOM TOOTH EXTRACTION      There were no vitals filed for this visit.   Subjective Assessment - 10/18/20 1450    Subjective No new changes/complaints. No pain. Is not wearing the brace today.    Pertinent History HTN    Limitations Walking    Patient Stated Goals wants to work on not walking with the cane    Currently in Pain? No/denies             [facility] Adult PT Treatment/Exercise - 10/18/20 0001      Transfers   Transfers Sit to Stand;Stand to Sit    Sit to Stand 5: Supervision    Stand to Sit 5: Supervision      Ambulation/Gait   Ambulation/Gait Yes    Ambulation/Gait Assistance 5: Supervision    Ambulation/Gait Assistance Details completed ambulation x 230 ft without AFO donned, as patient did not wear into session. No significant instability noted at knee and able to clear foot. Patient did wear new shoes with tread on them that did make for increased friction. Planned to trial least restrictive brace (Foot Up) however unable to place on shoes patient wearing into  session. PT educating to bring another pair of lace up tennis shoes into next week session and will assess with foot up brace donned.    Ambulation Distance (Feet) 230 Feet    Assistive device Straight cane   hurrycane   Gait Pattern Step-through pattern;Decreased step length - left;Decreased stance time - left;Decreased hip/knee flexion - left;Decreased arm swing - left    Ambulation Surface Level;Indoor      Neuro Re-ed    Neuro Re-ed Details  on mat, completed tall kneeling activites using bench for support as needed. Completed hip hinges x 10 reps working on improved control and weight shift to LLE. PT providing faciliation at pelvis for improved weight shift. Completed hip abduction out/in with RLE to promote LLE weight shift x 10 reps, increased challenge requiring single UE support. With midline completed alteranting weight shift through pelvis to R/L x 10 reps prior to reaching activity to promote improved technique and muscle recruitment. Then progressed lateral weight shift left with reaching to cone placed on R x 15 reps. Improved control and posture noted with proper weight shift prior to reaching. Intermittent CGA due to balance challenge. In // bars: completed step ups onto rockerboard (positioned A/P) with alteranting BLE and opposite LE toe tap to cone, completed x  15 reps. progressing from single UE > no UE support. Close CGA required.                    PT Short Term Goals - 10/11/20 1651      PT SHORT TERM GOAL #1   Title Pt will ambulate at least 500' outdoors with hurry cane over paved surfaces and perform a curb with supervision in order to demo improved community mobility. ALL STGS DUE 11/08/20    Time 4    Period Weeks    Status New    Target Date 11/08/20      PT SHORT TERM GOAL #2   Title Pt will improve gait speed to at least 1.5 ft/sec with hurry cane in order to demo decr fall risk.    Baseline 1.35 ft/sec on 10/11/20    Time 4    Period Weeks    Status  New      PT SHORT TERM GOAL #3   Title Pt will perform 4 steps with use of cane with supervision in order to demo improved community access.    Time 4    Period Weeks    Status New             PT Long Term Goals - 10/11/20 1655      PT LONG TERM GOAL #1   Title Pt and pt's spouse will be independent with final HEP in order to build upon functional gains made in therapy. ALL LTGS DUE 12/06/20    Baseline pt reports independence with HEP - will benefit from continued additions    Time 8    Period Weeks    Status New    Target Date 12/06/20      PT LONG TERM GOAL #2   Title Pt will ambulate small distances of 20-30' with no AD and supervision while holding a plate in order for pt to ambulate from his kitchen to the dining room safely.    Time 8    Period Weeks    Status New      PT LONG TERM GOAL #3   Title Pt will perform TUG in 19 seconds or less with LRAD in order to demo decr fall risk.    Baseline 23.91 seconds; 22.79 seconds with hurry cane    Time 8    Period Weeks    Status Revised      PT LONG TERM GOAL #4   Title Pt will ambulate outdoors over grass surfaces with supervision with LRAD  in order to demo improved community access and balance in his yard.    Time 8    Period Weeks    Status New      PT LONG TERM GOAL #5   Title Pt will improve gait speed to at least 1.6 ft/sec with hurry cane in order to demo decr fall risk.    Baseline 1.35 ft/sec    Time 8    Period Weeks    Status New                 Plan - 10/18/20 1746    Clinical Impression Statement Today's skilled PT session focused on gait training without AFO, patient demo improvement with potential for least restrictive brace. Attempted to complete gait with Foot Up but unable due to shoewear. Continued NMR activites in tall kneeling working on improved hip extension, stability, and weight shift to LLE. Increased challenge with SLS on rockerboard but  tolerating well today. Will continue to  progress toward all LTGs.    Personal Factors and Comorbidities Comorbidity 1;Past/Current Experience;Profession    Comorbidities HTN    Examination-Activity Limitations Stand;Stairs;Squat;Transfers;Locomotion Level    Examination-Participation Restrictions Community Activity;Occupation   walking his dog   Stability/Clinical Decision Making Stable/Uncomplicated    Rehab Potential Good    PT Frequency 1x / week    PT Duration 8 weeks    PT Treatment/Interventions ADLs/Self Care Home Management;Aquatic Therapy;Electrical Stimulation;DME Instruction;Gait training;Stair training;Functional mobility training;Therapeutic activities;Therapeutic exercise;Balance training;Neuromuscular re-education;Wheelchair mobility training;Orthotic Fit/Training;Patient/family education;Passive range of motion;Energy conservation;Vestibular    PT Next Visit Plan Trial foot up brace. balance strategies on different surfaces/decr UE support, NMR/strengthening for LLE. SLS on LLE. continue tall kneeling. stretches for incr tone in RLE.    PT Home Exercise Plan [id number]    Consulted and Agree with Plan of Care Patient           Patient will benefit from skilled therapeutic intervention in order to improve the following deficits and impairments:  Abnormal gait,Decreased activity tolerance,Decreased balance,Decreased mobility,Decreased knowledge of use of DME,Decreased coordination,Decreased range of motion,Decreased strength,Difficulty walking,Impaired tone,Impaired sensation,Postural dysfunction  Visit Diagnosis: Unsteadiness on feet  Muscle weakness (generalized)  Other abnormalities of gait and mobility     Problem List Patient Active Problem List   Diagnosis Date Noted  . Elevated glucose 10/13/2020  . Hyperlipidemia associated with type 2 diabetes mellitus ([place]) 10/05/2020  . History of CVA (cerebrovascular accident) 01/19/2020  . Normocytic anemia   . Benign essential HTN   . Hyponatremia   . Acute  blood loss anemia   . Hypotension due to drugs   . Vascular headache   . Prerenal azotemia   . Sleep disturbance   . Leukocytosis   . Transaminitis   . Marijuana abuse   . Essential hypertension   . Dysphagia, post-stroke   . Left hemiparesis ([place])   . Acute respiratory failure ([place])   . ICH (intracerebral hemorrhage) ([place]) 12/06/2019    Jones Bales, PT, DPT 10/18/2020, 5:49 PM  [facility] [address] [place], [place], [address] Phone: [phone]   Fax:  [phone]  Name: Bleu Minerd MRN: [id number] Date of Birth: [birth year]

## 2020-10-23 ENCOUNTER — Telehealth: Payer: Self-pay | Admitting: Family Medicine

## 2020-10-23 NOTE — Telephone Encounter (Signed)
Returned patients call, no answer LMTCB 

## 2020-10-23 NOTE — Telephone Encounter (Signed)
Pt states that carvedilol (COREG) 25 MG tablet was recently discontinued but he is worried about his blood pressure. Pt requesting call back [phone].  133/91 10/23/20 - am before eating 131/96 10/22/20 - am after eating

## 2020-10-23 NOTE — Telephone Encounter (Signed)
Per patient he have been off Carvedilol for a few weeks this was discontinued and it seems like his BP is slowly going up after stop taking below are readings from yesterday and today. No symptoms at this time. Patient not sure if he should start back on medication. Or he should do. Please advise

## 2020-10-25 ENCOUNTER — Other Ambulatory Visit: Payer: Self-pay

## 2020-10-25 ENCOUNTER — Ambulatory Visit: Payer: Managed Care, Other (non HMO) | Admitting: Physical Therapy

## 2020-10-25 ENCOUNTER — Encounter: Payer: Self-pay | Admitting: Physical Therapy

## 2020-10-25 ENCOUNTER — Encounter: Payer: Self-pay | Admitting: Occupational Therapy

## 2020-10-25 ENCOUNTER — Ambulatory Visit: Payer: Managed Care, Other (non HMO) | Admitting: Occupational Therapy

## 2020-10-25 DIAGNOSIS — I69354 Hemiplegia and hemiparesis following cerebral infarction affecting left non-dominant side: Secondary | ICD-10-CM

## 2020-10-25 DIAGNOSIS — R29818 Other symptoms and signs involving the nervous system: Secondary | ICD-10-CM

## 2020-10-25 DIAGNOSIS — M6281 Muscle weakness (generalized): Secondary | ICD-10-CM

## 2020-10-25 DIAGNOSIS — R2681 Unsteadiness on feet: Secondary | ICD-10-CM

## 2020-10-25 DIAGNOSIS — R208 Other disturbances of skin sensation: Secondary | ICD-10-CM

## 2020-10-25 DIAGNOSIS — R2689 Other abnormalities of gait and mobility: Secondary | ICD-10-CM

## 2020-10-25 NOTE — Therapy (Signed)
[facility] [address] [place], [place], [address] Phone: [phone]   Fax:  [phone]  Occupational Therapy Treatment  Patient Details  Name: Dennis Mercado MRN: [id number] Date of Birth: 02-Nov-1980 Referring Provider (OT): Dr. Letta Pate   Encounter Date: 10/25/2020   OT End of Session - 10/25/20 1458    Visit Number 12    Number of Visits 16    Date for OT Re-Evaluation 11/24/20    Authorization Type need to be determined    Authorization Time Period per patient 30 visit OT limit    Authorization - Visit Number 12    Authorization - Number of Visits 16    OT Start Time 1400    OT Stop Time 1445    OT Time Calculation (min) 45 min           History reviewed. No pertinent past medical history.  Past Surgical History:  Procedure Laterality Date  . WISDOM TOOTH EXTRACTION      There were no vitals filed for this visit.   Subjective Assessment - 10/25/20 1407    Subjective  I wore dress shoes and no brace (for an interview) foot feels kind of funny    Pertinent History Pt. presented to ED on 12/06/2019 with left side weakness and  altered mental status s/p coitus.  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 Pt required intubation 6/22 due to ARF secondary to aspiration PNA. Received inpatient rehab. Pt was discharged 7/28/2    Limitations HTN, left inattention, apraxia    Patient Stated Goals to be able to use my hand    Currently in Pain? No/denies    Pain Score 0-No pain                        OT Treatments/Exercises (OP) - 10/25/20 0001      Neurological Re-education Exercises   Other Exercises 1 Neuromuscular reeducation to address left spastic hemiplegia - helping arm and body move separately from body and vice versa.  Worked on closed chain activity to load left arm and leg - working on weight  shifting left, first in standing, then in quadruped.  Worked on rotating up to and down from quadruped to side sit in both directions.  Worked on standing weight shift with decreased tension in arm - open chain., then low reach forward open chain.  Practiced child's pose as potential stretch for home/                    OT Short Term Goals - 10/25/20 1500      OT SHORT TERM GOAL #1   Title I with inital HEP    Time 4    Period Weeks    Status Achieved      OT SHORT TERM GOAL #2   Title Patient will demonstrate low reach to 30 degrees of shoulder flexion with min assist    Time 4    Period Weeks    Status Achieved      OT SHORT TERM GOAL #3   Title Patient will demonstrate 30 degrees of active assited wrist and forearm motion    Time 4    Period Weeks    Status Achieved      OT SHORT TERM GOAL #4   Title xxx      OT SHORT TERM GOAL #5  Title xxx             OT Long Term Goals - 10/25/20 1501      OT LONG TERM GOAL #1   Title I with aquatic HEP    Time 8    Period Weeks    Status On-going      OT LONG TERM GOAL #2   Title Patient will demonstrate low reach to 45 degrees of shoulder flexion with elbow extension    Time 8    Period Weeks    Status On-going      OT LONG TERM GOAL #3   Title Patient will demonstrate no pain with passive range of motion to 120 shoulder flexion/abduction    Time 8    Period Weeks    Status Achieved      OT LONG TERM GOAL #4   Title xxx      OT LONG TERM GOAL #5   Title xxx      OT LONG TERM GOAL #6   Title xxx                 Plan - 10/25/20 1459    Clinical Impression Statement Patient with significant UE tightness at the beginning of this session - responds well to closed chain activity - need to ensure lasting impact.  Patient still with apprehension about weight shifting onto left side moreso in standing than sitting.    OT Occupational Profile and History Detailed Assessment- Review of Records and  additional review of physical, cognitive, psychosocial history related to current functional performance    Occupational performance deficits (Please refer to evaluation for details): ADL's;IADL's;Leisure;Social Participation;Work;Play    Body Structure / Function / Physical Skills ADL;Balance;Endurance;Mobility;Strength;UE functional use;FMC;Vision;Coordination;Gait;Decreased knowledge of precautions;GMC;ROM;Decreased knowledge of use of DME;Dexterity;IADL;Sensation;Tone;Body Banker;Safety Awareness;Thought;Understand    Rehab Potential Good    Clinical Decision Making Limited treatment options, no task modification necessary    Comorbidities Affecting Occupational Performance: May have comorbidities impacting occupational performance    Modification or Assistance to Complete Evaluation  No modification of tasks or assist necessary to complete eval    OT Frequency 2x / week    OT Duration 8 weeks    OT Treatment/Interventions Self-care/ADL training;Therapeutic exercise;Functional Mobility Training;Balance training;Splinting;Manual Therapy;Neuromuscular education;Aquatic Therapy;Ultrasound;Energy conservation;Therapeutic activities;Cryotherapy;Paraffin;DME and/or AE instruction;Cognitive remediation/compensation;Visual/perceptual remediation/compensation;Gait Training;Fluidtherapy;Electrical Stimulation;Moist Heat;Contrast Bath;Passive range of motion;Patient/family education    Plan aquatic therapy and clinic therapy to address spasticity, need left trunk lengthening/right trunk shortening, realignment of shoulder girdle, and isolated glenohumeral joint motion    Consulted and Agree with Plan of Care Patient           Patient will benefit from skilled therapeutic intervention in order to improve the following deficits and impairments:   Body Structure / Function / Physical Skills: ADL,Balance,Endurance,Mobility,Strength,UE functional  use,FMC,Vision,Coordination,Gait,Decreased knowledge of precautions,GMC,ROM,Decreased knowledge of use of DME,Dexterity,IADL,Sensation,Tone,Body mechanics Cognitive Skills: Attention,Memory,Problem Solve,Safety Awareness,Thought,Understand     Visit Diagnosis: Unsteadiness on feet  Muscle weakness (generalized)  Hemiplegia and hemiparesis following cerebral infarction affecting left non-dominant side (HCC)  Other symptoms and signs involving the nervous system  Other disturbances of skin sensation    Problem List Patient Active Problem List   Diagnosis Date Noted  . Elevated glucose 10/13/2020  . Hyperlipidemia associated with type 2 diabetes mellitus ([place]) 10/05/2020  . History of CVA (cerebrovascular accident) 01/19/2020  . Normocytic anemia   . Benign essential HTN   . Hyponatremia   . Acute blood  loss anemia   . Hypotension due to drugs   . Vascular headache   . Prerenal azotemia   . Sleep disturbance   . Leukocytosis   . Transaminitis   . Marijuana abuse   . Essential hypertension   . Dysphagia, post-stroke   . Left hemiparesis ([place])   . Acute respiratory failure ([place])   . ICH (intracerebral hemorrhage) ([place]) 12/06/2019    Mariah Milling, OTR/L 10/25/2020, 3:02 PM  [facility] [address] [place], [place], [address] Phone: [phone]   Fax:  [phone]  Name: Dennis Mercado MRN: [id number] Date of Birth: 10/23/1980

## 2020-10-25 NOTE — Therapy (Addendum)
[name] [address] [place], [place], [address] Phone: [phone]   Fax:  [phone]  Physical Therapy Treatment  Patient Details  Name: Dennis Mercado MRN: [id number] Date of Birth: February 15, 1981 Referring Provider (PT): [name], MD   Encounter Date: 10/25/2020   PT End of Session - 10/25/20 1533    Visit Number 11    Number of Visits 17    Authorization Type VL: 48 visits PT, 30 OT    Authorization - Visit Number 8    Authorization - Number of Visits 60    PT Start Time 8546    PT Stop Time 1530    PT Time Calculation (min) 43 min    Equipment Utilized During Treatment Gait belt    Activity Tolerance Patient tolerated treatment well    Behavior During Therapy [facility] for tasks assessed/performed           History reviewed. No pertinent past medical history.  Past Surgical History:  Procedure Laterality Date  . WISDOM TOOTH EXTRACTION      There were no vitals filed for this visit.   Subjective Assessment - 10/25/20 1449    Subjective Had a job interview this morning, went well, and walking dress shoes was weird.    Pertinent History HTN    Limitations Walking    Patient Stated Goals wants to work on not walking with the cane    Currently in Pain? No/denies                             [facility] Adult PT Treatment/Exercise - 10/25/20 1529      Transfers   Comments staggered stance mini squats x10 reps with LLE posteriorly for incr weight bearing      Ambulation/Gait   Ambulation/Gait Yes    Ambulation/Gait Assistance 5: Supervision    Ambulation/Gait Assistance Details trialed foot up brace x2 laps with no noticeable different in foot clearance noted, then performed an additional x2 laps with no AD - pt also reports not really feeling a difference between the two. Discussed best option would be having pt go to [employer] and get toe caps on shoes that he would want to use.     Ambulation Distance (Feet) 230 Feet x2   Gait Pattern Step-through pattern;Decreased step length - left;Decreased stance time - left;Decreased hip/knee flexion - left;Decreased arm swing - left    Ambulation Surface Level;Indoor               Balance Exercises - 10/25/20 1528      Balance Exercises: Standing   Rockerboard Lateral;EC;EO    Rockerboard Limitations in M/L direction weight shifting x15 reps with cues for technique and use of mirror as visual cue, eyes closed - 4 x 15-20 seconds with UE support used as needed on bars for balance, practice multi-directional reaching with RUE to L side to grab corn hole bags from PT tech and then toss into crate, PT providing min guard/min A    Step Ups 6 inch;UE support 1;Forward;Limitations    Step Ups Limitations 2 x10 reps with LLE on step, cues for full knee extension when stepping up    Other Standing Exercises Shifting over to LLE and stepping over yardstick with RLE x10 reps , then stepping to green dot with RLE X10 REPS for incr weight shifting/bearing to LLE  PT Education - 10/25/20 1532    Education Details see gait section, use of a Portable Vehicle Support Handle for pt to put in his wife's higher SUV car when getting in on the passenger side - showed where to purchase on [employer].    Person(s) Educated Patient    Methods Explanation    Comprehension Verbalized understanding            PT Short Term Goals - 10/11/20 1651      PT SHORT TERM GOAL #1   Title Pt will ambulate at least 500' outdoors with hurry cane over paved surfaces and perform a curb with supervision in order to demo improved community mobility. ALL STGS DUE 11/08/20    Time 4    Period Weeks    Status New    Target Date 11/08/20      PT SHORT TERM GOAL #2   Title Pt will improve gait speed to at least 1.5 ft/sec with hurry cane in order to demo decr fall risk.    Baseline 1.35 ft/sec on 10/11/20    Time 4    Period Weeks    Status New       PT SHORT TERM GOAL #3   Title Pt will perform 4 steps with use of cane with supervision in order to demo improved community access.    Time 4    Period Weeks    Status New             PT Long Term Goals - 10/11/20 1655      PT LONG TERM GOAL #1   Title Pt and pt's spouse will be independent with final HEP in order to build upon functional gains made in therapy. ALL LTGS DUE 12/06/20    Baseline pt reports independence with HEP - will benefit from continued additions    Time 8    Period Weeks    Status New    Target Date 12/06/20      PT LONG TERM GOAL #2   Title Pt will ambulate small distances of 20-30' with no AD and supervision while holding a plate in order for pt to ambulate from his kitchen to the dining room safely.    Time 8    Period Weeks    Status New      PT LONG TERM GOAL #3   Title Pt will perform TUG in 19 seconds or less with LRAD in order to demo decr fall risk.    Baseline 23.91 seconds; 22.79 seconds with hurry cane    Time 8    Period Weeks    Status Revised      PT LONG TERM GOAL #4   Title Pt will ambulate outdoors over grass surfaces with supervision with LRAD  in order to demo improved community access and balance in his yard.    Time 8    Period Weeks    Status New      PT LONG TERM GOAL #5   Title Pt will improve gait speed to at least 1.6 ft/sec with hurry cane in order to demo decr fall risk.    Baseline 1.35 ft/sec    Time 8    Period Weeks    Status New              11/01/20 1152  Plan  Clinical Impression Statement Trialed foot-up brace today, but not noticeable different during gait with foot clearance. Pt reporting he also did not feel  a big difference. Pt wearing new shoes today with toe scuffing at times but no LOB. Discussed best idea would be having pt have additional toe caps added onto other pairs of shoes. Remainder of session focused on balance strategies with weight shifting to LLE with min guard needed as appropriate.  Will continue to progress towards LTGs.  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)  Pt will benefit from skilled therapeutic intervention in order to improve on the following deficits Abnormal gait;Decreased activity tolerance;Decreased balance;Decreased mobility;Decreased knowledge of use of DME;Decreased coordination;Decreased range of motion;Decreased strength;Difficulty walking;Impaired tone;Impaired sensation;Postural dysfunction  Stability/Clinical Decision Making Stable/Uncomplicated  Rehab Potential Good  PT Frequency 1x / week  PT Duration 8 weeks  PT Treatment/Interventions ADLs/Self Care Home Management;Aquatic Therapy;Electrical Stimulation;DME Instruction;Gait training;Stair training;Functional mobility training;Therapeutic activities;Therapeutic exercise;Balance training;Neuromuscular re-education;Wheelchair mobility training;Orthotic Fit/Training;Patient/family education;Passive range of motion;Energy conservation;Vestibular  PT Next Visit Plan . balance strategies on different surfaces/decr UE support, NMR/strengthening for LLE. SLS on LLE. continue tall kneeling. stretches for incr tone in RLE.  PT Home Exercise Plan [id number]  Consulted and Agree with Plan of Care Patient         Patient will benefit from skilled therapeutic intervention in order to improve the following deficits and impairments:     Visit Diagnosis: Unsteadiness on feet  Muscle weakness (generalized)  Other abnormalities of gait and mobility  Hemiplegia and hemiparesis following cerebral infarction affecting left non-dominant side (HCC)  Other symptoms and signs involving the nervous system     Problem List Patient Active Problem List   Diagnosis Date Noted  .  Elevated glucose 10/13/2020  . Hyperlipidemia associated with type 2 diabetes mellitus ([place]) 10/05/2020  . History of CVA (cerebrovascular accident) 01/19/2020  . Normocytic anemia   . Benign essential HTN   . Hyponatremia   . Acute blood loss anemia   . Hypotension due to drugs   . Vascular headache   . Prerenal azotemia   . Sleep disturbance   . Leukocytosis   . Transaminitis   . Marijuana abuse   . Essential hypertension   . Dysphagia, post-stroke   . Left hemiparesis ([place])   . Acute respiratory failure ([place])   . ICH (intracerebral hemorrhage) ([place]) 12/06/2019    Arliss Journey, PT, DPT  10/25/2020, 4:57 PM  [facility] [address] [place], [place], [address] Phone: [phone]   Fax:  [phone]  Name: Kenji Mapel MRN: [id number] Date of Birth: [birth year]

## 2020-10-29 ENCOUNTER — Other Ambulatory Visit: Payer: Self-pay | Admitting: Family

## 2020-10-30 ENCOUNTER — Ambulatory Visit: Payer: Managed Care, Other (non HMO) | Admitting: Occupational Therapy

## 2020-10-30 ENCOUNTER — Encounter: Payer: Self-pay | Admitting: Occupational Therapy

## 2020-10-30 DIAGNOSIS — I69354 Hemiplegia and hemiparesis following cerebral infarction affecting left non-dominant side: Secondary | ICD-10-CM | POA: Diagnosis not present

## 2020-10-30 NOTE — Therapy (Signed)
[facility] [address] [place], [place], [address] Phone: [phone]   Fax:  [phone]  Occupational Therapy Treatment  Patient Details  Name: Dennis Mercado MRN: [id number] Date of Birth: 1980/11/10 Referring Provider (OT): Dr. Letta Pate   Encounter Date: 10/30/2020   OT End of Session - 10/30/20 1832    Visit Number 13    Number of Visits 16    Date for OT Re-Evaluation 11/24/20    Authorization - Visit Number 13    Authorization - Number of Visits 16    OT Start Time 6962    OT Stop Time 1500    OT Time Calculation (min) 45 min    Equipment Utilized During Treatment floatation equipment    Activity Tolerance Patient tolerated treatment well    Behavior During Therapy [facility] for tasks assessed/performed           History reviewed. No pertinent past medical history.  Past Surgical History:  Procedure Laterality Date  . WISDOM TOOTH EXTRACTION      There were no vitals filed for this visit.   Subjective Assessment - 10/30/20 1832    Subjective  I am so tight today.  I had a massage last week, and it helped for a long time.    Patient is accompanied by: Family member    Pertinent History Pt. presented to ED on 12/06/2019 with left side weakness and  altered mental status s/p coitus.  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 Pt required intubation 6/22 due to ARF secondary to aspiration PNA. Received inpatient rehab. Pt was discharged 7/28/2    Limitations HTN, left inattention, apraxia    Patient Stated Goals to be able to use my hand    Currently in Pain? No/denies    Pain Score 0-No pain             Patient seen for aquatic therapy visit.  Patient entered and exited pool with supervision via stairs.  Patient with increased LE tightness today.  Initiated session with walking to reduce tension in LLE.  Opted for  deeper water 4+feet for improved buoyancy.  Worked on standing balance  - static and dynamic.  Self stretch of UE on pool ladder/railing.  Patient reports no pain with self stretching techniques.  Seated on underwater bench - working on isolated low to mid reaching with emphasis on alignment and activation to left side to increase length of reach.                       OT Short Term Goals - 10/30/20 1834      OT SHORT TERM GOAL #1   Title I with inital HEP    Time 4    Period Weeks    Status Achieved      OT SHORT TERM GOAL #2   Title Patient will demonstrate low reach to 30 degrees of shoulder flexion with min assist    Time 4    Period Weeks    Status Achieved      OT SHORT TERM GOAL #3   Title Patient will demonstrate 30 degrees of active assited wrist and forearm motion    Time 4    Period Weeks    Status Achieved      OT SHORT TERM GOAL #4   Title xxx      OT SHORT TERM GOAL #5  Title xxx             OT Long Term Goals - 10/30/20 1835      OT LONG TERM GOAL #1   Title I with aquatic HEP    Time 8    Period Weeks    Status On-going      OT LONG TERM GOAL #2   Title Patient will demonstrate low reach to 45 degrees of shoulder flexion with elbow extension    Time 8    Period Weeks    Status On-going      OT LONG TERM GOAL #3   Title Patient will demonstrate no pain with passive range of motion to 120 shoulder flexion/abduction    Time 8    Period Weeks    Status Achieved      OT LONG TERM GOAL #4   Title xxx      OT LONG TERM GOAL #5   Title xxx      OT LONG TERM GOAL #6   Title xxx                 Plan - 10/30/20 1833    Clinical Impression Statement Patient with significant UE/LE tightness at the beginning of this session, improved at end of session, but increases with increased postural demand    OT Occupational Profile and History Detailed Assessment- Review of Records and additional review of physical, cognitive,  psychosocial history related to current functional performance    Occupational performance deficits (Please refer to evaluation for details): ADL's;IADL's;Leisure;Social Participation;Work;Play    Body Structure / Function / Physical Skills ADL;Balance;Endurance;Mobility;Strength;UE functional use;FMC;Vision;Coordination;Gait;Decreased knowledge of precautions;GMC;ROM;Decreased knowledge of use of DME;Dexterity;IADL;Sensation;Tone;Body Banker;Safety Awareness;Thought;Understand    Rehab Potential Good    Clinical Decision Making Limited treatment options, no task modification necessary    Comorbidities Affecting Occupational Performance: May have comorbidities impacting occupational performance    Modification or Assistance to Complete Evaluation  No modification of tasks or assist necessary to complete eval    OT Frequency 2x / week    OT Duration 8 weeks    OT Treatment/Interventions Self-care/ADL training;Therapeutic exercise;Functional Mobility Training;Balance training;Splinting;Manual Therapy;Neuromuscular education;Aquatic Therapy;Ultrasound;Energy conservation;Therapeutic activities;Cryotherapy;Paraffin;DME and/or AE instruction;Cognitive remediation/compensation;Visual/perceptual remediation/compensation;Gait Training;Fluidtherapy;Electrical Stimulation;Moist Heat;Contrast Bath;Passive range of motion;Patient/family education    Plan aquatic therapy and clinic therapy to address spasticity, need left trunk lengthening/right trunk shortening, realignment of shoulder girdle, and isolated glenohumeral joint motion    Consulted and Agree with Plan of Care Patient    Family Member Consulted wife April           Patient will benefit from skilled therapeutic intervention in order to improve the following deficits and impairments:   Body Structure / Function / Physical Skills: ADL,Balance,Endurance,Mobility,Strength,UE functional  use,FMC,Vision,Coordination,Gait,Decreased knowledge of precautions,GMC,ROM,Decreased knowledge of use of DME,Dexterity,IADL,Sensation,Tone,Body mechanics Cognitive Skills: Attention,Memory,Problem Solve,Safety Awareness,Thought,Understand     Visit Diagnosis: Unsteadiness on feet  Hemiplegia and hemiparesis following cerebral infarction affecting left non-dominant side (HCC)  Muscle weakness (generalized)  Other disturbances of skin sensation    Problem List Patient Active Problem List   Diagnosis Date Noted  . Elevated glucose 10/13/2020  . Hyperlipidemia associated with type 2 diabetes mellitus ([place]) 10/05/2020  . History of CVA (cerebrovascular accident) 01/19/2020  . Normocytic anemia   . Benign essential HTN   . Hyponatremia   . Acute blood loss anemia   . Hypotension due to drugs   . Vascular headache   . Prerenal azotemia   .  Sleep disturbance   . Leukocytosis   . Transaminitis   . Marijuana abuse   . Essential hypertension   . Dysphagia, post-stroke   . Left hemiparesis ([place])   . Acute respiratory failure ([place])   . ICH (intracerebral hemorrhage) ([place]) 12/06/2019    Mariah Milling 10/30/2020, 6:36 PM  [facility] [address] [place], [place], [address] Phone: [phone]   Fax:  [phone]  Name: Iva Posten MRN: [id number] Date of Birth: 1981/03/22

## 2020-10-31 ENCOUNTER — Other Ambulatory Visit: Payer: Self-pay

## 2020-10-31 ENCOUNTER — Ambulatory Visit: Payer: Managed Care, Other (non HMO) | Admitting: Family Medicine

## 2020-10-31 ENCOUNTER — Encounter: Payer: Self-pay | Admitting: Family Medicine

## 2020-10-31 VITALS — BP 116/82 | HR 84 | Temp 98.2°F | Ht 67.0 in | Wt 147.2 lb

## 2020-10-31 DIAGNOSIS — I10 Essential (primary) hypertension: Secondary | ICD-10-CM | POA: Diagnosis not present

## 2020-10-31 DIAGNOSIS — Z8673 Personal history of transient ischemic attack (TIA), and cerebral infarction without residual deficits: Secondary | ICD-10-CM

## 2020-10-31 DIAGNOSIS — R7309 Other abnormal glucose: Secondary | ICD-10-CM

## 2020-10-31 NOTE — Progress Notes (Signed)
So his machine is getting a lot higher yeah okay   Established Patient Office Visit  Subjective:  Patient ID: Dennis Mercado, male    DOB: [birth year]  Age: 40 y.o. MRN: [id number]  CC:  Chief Complaint  Patient presents with  . Follow-up    Concerns about elevated BP.     HPI Dennis Mercado presents for follow-up of his blood pressure.  By his home monitor his blood pressures been running in the 140/90 range.  He brings that monitor with him today to compare with our manual cuff.  We Dennis also be repeating his hemoglobin A1c.  Prior study had been slightly elevated.  He admits that he consumes a lot of sugar in his diet.  History reviewed. No pertinent past medical history.  Past Surgical History:  Procedure Laterality Date  . WISDOM TOOTH EXTRACTION      History reviewed. No pertinent family history.  Social History   Socioeconomic History  . Marital status: Married    Spouse name: Not on file  . Number of children: Not on file  . Years of education: Not on file  . Highest education level: Not on file  Occupational History  . Not on file  Tobacco Use  . Smoking status: Never Smoker  . Smokeless tobacco: Never Used  Vaping Use  . Vaping Use: Never used  Substance and Sexual Activity  . Alcohol use: Not Currently    Comment: several drinks over the weekend  . Drug use: Never  . Sexual activity: Not on file  Other Topics Concern  . Not on file  Social History Narrative  . Not on file   Social Determinants of Health   Financial Resource Strain: Not on file  Food Insecurity: Not on file  Transportation Needs: Not on file  Physical Activity: Not on file  Stress: Not on file  Social Connections: Not on file  Intimate Partner Violence: Not on file    Outpatient Medications Prior to Visit  Medication Sig Dispense Refill  . amLODipine (NORVASC) 10 MG tablet TAKE 1 TABLET(10 MG) BY MOUTH DAILY 90 tablet 0  . atorvastatin (LIPITOR) 40 MG tablet TAKE 1 TABLET(40  MG) BY MOUTH DAILY 90 tablet 0  . carvedilol (COREG) 25 MG tablet TAKE 1 TABLET(25 MG) BY MOUTH TWICE DAILY WITH A MEAL 90 tablet 0  . levETIRAcetam (KEPPRA) 250 MG tablet Take 1 tablet (250 mg total) by mouth 2 (two) times daily. 120 tablet 0  . lisinopril (ZESTRIL) 5 MG tablet TAKE 1 TABLET(5 MG) BY MOUTH DAILY 90 tablet 0  . acetaminophen (TYLENOL) 325 MG tablet Take 2 tablets (650 mg total) by mouth every 4 (four) hours as needed for mild pain (or temp > 37.5 C (99.5 F)).    Marland Kitchen omeprazole (PRILOSEC OTC) 20 MG tablet Take 20 mg by mouth daily.  (Patient not taking: Reported on 10/31/2020)     No facility-administered medications prior to visit.    Allergies  Allergen Reactions  . Percocet [Oxycodone-Acetaminophen] Itching    Pt says he has previously tolerated.     ROS Review of Systems  Constitutional: Negative.   HENT: Negative.   Eyes: Negative for photophobia and visual disturbance.  Respiratory: Negative.   Cardiovascular: Negative.   Gastrointestinal: Negative.   Endocrine: Negative for polyphagia and polyuria.  Genitourinary: Negative.   Musculoskeletal: Negative.   Neurological: Negative for headaches.  Psychiatric/Behavioral: Negative.       Objective:    Physical Exam Vitals and nursing  note reviewed.  Constitutional:      General: He is not in acute distress.    Appearance: Normal appearance. He is normal weight. He is not ill-appearing, toxic-appearing or diaphoretic.  HENT:     Head: Normocephalic and atraumatic.     Right Ear: Tympanic membrane, ear canal and external ear normal.     Left Ear: Tympanic membrane, ear canal and external ear normal.     Mouth/Throat:     Mouth: Mucous membranes are moist.     Pharynx: Oropharynx is clear. No oropharyngeal exudate or posterior oropharyngeal erythema.  Eyes:     General: No scleral icterus.       Right eye: No discharge.        Left eye: No discharge.     Conjunctiva/sclera: Conjunctivae normal.      Pupils: Pupils are equal, round, and reactive to light.  Neck:     Vascular: No carotid bruit.  Cardiovascular:     Rate and Rhythm: Normal rate and regular rhythm.  Pulmonary:     Effort: Pulmonary effort is normal.     Breath sounds: Normal breath sounds.  Musculoskeletal:     Cervical back: No rigidity or tenderness.  Lymphadenopathy:     Cervical: No cervical adenopathy.  Skin:    General: Skin is warm and dry.  Neurological:     Mental Status: He is alert and oriented to person, place, and time.  Psychiatric:        Mood and Affect: Mood normal.        Behavior: Behavior normal.     BP 116/82   Pulse 84   Temp 98.2 F (36.8 C) (Temporal)   Ht 5\' 7"  (1.702 m)   Wt 147 lb 3.2 oz (66.8 kg)   SpO2 100%   BMI 23.05 kg/m  Wt Readings from Last 3 Encounters:  10/31/20 147 lb 3.2 oz (66.8 kg)  10/05/20 148 lb (67.1 kg)  10/03/20 150 lb (68 kg)     Health Maintenance Due  Topic Date Due  . PNEUMOCOCCAL POLYSACCHARIDE VACCINE AGE 103-64 HIGH RISK  Never done  . FOOT EXAM  Never done  . OPHTHALMOLOGY EXAM  Never done  . Hepatitis C Screening  Never done    There are no preventive care reminders to display for this patient.  No results found for: TSH Lab Results  Component Value Date   WBC 6.6 10/05/2020   HGB 13.2 10/05/2020   HCT 40.8 10/05/2020   MCV 81.7 10/05/2020   PLT 294.0 10/05/2020   Lab Results  Component Value Date   NA 140 10/05/2020   K 4.2 10/05/2020   CO2 31 10/05/2020   GLUCOSE 94 10/05/2020   BUN 16 10/05/2020   CREATININE 1.24 10/05/2020   BILITOT 0.5 10/05/2020   ALKPHOS 63 10/05/2020   AST 15 10/05/2020   ALT 21 10/05/2020   PROT 7.5 10/05/2020   ALBUMIN 4.7 10/05/2020   CALCIUM 10.0 10/05/2020   ANIONGAP 10 01/10/2020   GFR 73.15 10/05/2020   Lab Results  Component Value Date   CHOL 106 10/05/2020   Lab Results  Component Value Date   HDL 37.50 (L) 10/05/2020   Lab Results  Component Value Date   LDLCALC 58 10/05/2020    Lab Results  Component Value Date   TRIG 55.0 10/05/2020   Lab Results  Component Value Date   CHOLHDL 3 10/05/2020   Lab Results  Component Value Date   HGBA1C 5.9 (A)  10/12/2020      Assessment & Plan:   Problem List Items Addressed This Visit      Cardiovascular and Mediastinum   Essential hypertension - Primary   Relevant Orders   Basic metabolic panel     Other   History of CVA (cerebrovascular accident)   Elevated glucose   Relevant Orders   Hemoglobin A1c      No orders of the defined types were placed in this encounter.   Follow-up: Return in about 3 months (around 01/31/2021).   Continue current hypertension regimen with amlodipine and lisinopril.  He was given information on how to take blood pressure.  Advised him that arm cuff is more accurate than the wrist cuff that he is currently using. Libby Maw, MD

## 2020-10-31 NOTE — Patient Instructions (Signed)
How to Take Your Blood Pressure Blood pressure is a measurement of how strongly your blood is pressing against the walls of your arteries. Arteries are blood vessels that carry blood from your heart throughout your body. Your health care provider takes your blood pressure at each office visit. You can also take your own blood pressure at home with a blood pressure monitor. You may need to take your own blood pressure to:  Confirm a diagnosis of high blood pressure (hypertension).  Monitor your blood pressure over time.  Make sure your blood pressure medicine is working. Supplies needed:  Blood pressure monitor.  Dining room chair to sit in.  Table or desk.  Small notebook and pencil or pen. How to prepare To get the most accurate reading, avoid the following for 30 minutes before you check your blood pressure:  Drinking caffeine.  Drinking alcohol.  Eating.  Smoking.  Exercising. Five minutes before you check your blood pressure:  Use the bathroom and urinate so that you have an empty bladder.  Sit quietly in a dining room chair. Do not sit in a soft couch or an armchair. Do not talk. How to take your blood pressure To check your blood pressure, follow the instructions in the manual that came with your blood pressure monitor. If you have a digital blood pressure monitor, the instructions may be as follows: 1. Sit up straight in a chair. 2. Place your feet on the floor. Do not cross your ankles or legs. 3. Rest your left arm at the level of your heart on a table or desk or on the arm of a chair. 4. Pull up your shirt sleeve. 5. Wrap the blood pressure cuff around the upper part of your left arm, 1 inch (2.5 cm) above your elbow. It is best to wrap the cuff around bare skin. 6. Fit the cuff snugly around your arm. You should be able to place only one finger between the cuff and your arm. 7. Position the cord so that it rests in the bend of your elbow. 8. Press the power  button. 9. Sit quietly while the cuff inflates and deflates. 10. Read the digital reading on the monitor screen and write the numbers down (record them) in a notebook. 11. Wait 2-3 minutes, then repeat the steps, starting at step 1.   What does my blood pressure reading mean? A blood pressure reading consists of a higher number over a lower number. Ideally, your blood pressure should be below 120/80. The first ("top") number is called the systolic pressure. It is a measure of the pressure in your arteries as your heart beats. The second ("bottom") number is called the diastolic pressure. It is a measure of the pressure in your arteries as the heart relaxes. Blood pressure is classified into five stages. The following are the stages for adults who do not have a short-term serious illness or a chronic condition. Systolic pressure and diastolic pressure are measured in a unit called mm Hg (millimeters of mercury).  Normal  Systolic pressure: below 120.  Diastolic pressure: below 80. Elevated  Systolic pressure: 120-129.  Diastolic pressure: below 80. Hypertension stage 1  Systolic pressure: 130-139.  Diastolic pressure: 80-89. Hypertension stage 2  Systolic pressure: 140 or above.  Diastolic pressure: 90 or above. You can have elevated blood pressure or hypertension even if only the systolic or only the diastolic number in your reading is higher than normal. Follow these instructions at home:  Check your blood   pressure as often as recommended by your health care provider.  Check your blood pressure at the same time every day.  Take your monitor to the next appointment with your health care provider to make sure that: ? You are using it correctly. ? It provides accurate readings.  Be sure you understand what your goal blood pressure numbers are.  Tell your health care provider if you are having any side effects from blood pressure medicine.  Keep all follow-up visits as told by  your health care provider. This is important. General tips  Your health care provider can suggest a reliable monitor that will meet your needs. There are several types of home blood pressure monitors.  Choose a monitor that has an arm cuff. Do not choose a monitor that measures your blood pressure from your wrist or finger.  Choose a cuff that wraps snugly around your upper arm. You should be able to fit only one finger between your arm and the cuff.  You can buy a blood pressure monitor at most drugstores or online. Where to find more information American Heart Association: www.heart.org Contact a health care provider if:  Your blood pressure is consistently high. Get help right away if:  Your systolic blood pressure is higher than 180.  Your diastolic blood pressure is higher than 120. Summary  Blood pressure is a measurement of how strongly your blood is pressing against the walls of your arteries.  A blood pressure reading consists of a higher number over a lower number. Ideally, your blood pressure should be below 120/80.  Check your blood pressure at the same time every day.  Avoid caffeine, alcohol, smoking, and exercise for 30 minutes prior to checking your blood pressure. These agents can affect the accuracy of the blood pressure reading. This information is not intended to replace advice given to you by your health care provider. Make sure you discuss any questions you have with your health care provider. Document Revised: 05/28/2019 Document Reviewed: 05/28/2019 Elsevier Patient Education  2021 Elsevier Inc.  

## 2020-10-31 NOTE — Telephone Encounter (Signed)
Appointment schedule for OV

## 2020-11-01 ENCOUNTER — Encounter: Payer: Self-pay | Admitting: Physical Therapy

## 2020-11-01 ENCOUNTER — Ambulatory Visit: Payer: Managed Care, Other (non HMO) | Admitting: Physical Therapy

## 2020-11-01 DIAGNOSIS — I69354 Hemiplegia and hemiparesis following cerebral infarction affecting left non-dominant side: Secondary | ICD-10-CM | POA: Diagnosis not present

## 2020-11-01 DIAGNOSIS — M6281 Muscle weakness (generalized): Secondary | ICD-10-CM

## 2020-11-01 DIAGNOSIS — R29818 Other symptoms and signs involving the nervous system: Secondary | ICD-10-CM

## 2020-11-01 DIAGNOSIS — R2681 Unsteadiness on feet: Secondary | ICD-10-CM

## 2020-11-01 LAB — BASIC METABOLIC PANEL
BUN: 16 mg/dL (ref 6–23)
CO2: 32 mEq/L (ref 19–32)
Calcium: 10.1 mg/dL (ref 8.4–10.5)
Chloride: 100 mEq/L (ref 96–112)
Creatinine, Ser: 1.22 mg/dL (ref 0.40–1.50)
GFR: 74.55 mL/min (ref >60.00)
Glucose, Bld: 88 mg/dL (ref 70–99)
Potassium: 4.6 mEq/L (ref 3.5–5.1)
Sodium: 140 mEq/L (ref 135–145)

## 2020-11-01 LAB — HEMOGLOBIN A1C: Hgb A1c MFr Bld: 5.9 % (ref 4.6–6.5)

## 2020-11-01 NOTE — Therapy (Signed)
[facility] [address] [place], [place], [address] Phone: [phone]   Fax:  [phone]  Physical Therapy Treatment  Patient Details  Name: Dennis Mercado MRN: [id number] Date of Birth: 06-Jan-1981 Referring Provider (PT): [name], MD   Encounter Date: 11/01/2020   PT End of Session - 11/01/20 1532    Visit Number 12    Number of Visits 17    Authorization Type VL: 60 visits PT, 30 OT    Authorization - Visit Number 11    Authorization - Number of Visits 60    PT Start Time 2376    PT Stop Time 2831    PT Time Calculation (min) 42 min    Equipment Utilized During Treatment Gait belt    Activity Tolerance Patient tolerated treatment well    Behavior During Therapy [facility] for tasks assessed/performed           History reviewed. No pertinent past medical history.  Past Surgical History:  Procedure Laterality Date  . WISDOM TOOTH EXTRACTION      There were no vitals filed for this visit.   Subjective Assessment - 11/01/20 1452    Subjective No falls. Has been doing more walking outside.    Pertinent History HTN    Limitations Walking    Patient Stated Goals wants to work on not walking with the cane    Currently in Pain? No/denies                             [facility] Adult PT Treatment/Exercise - 11/01/20 1528      Ambulation/Gait   Ambulation/Gait Yes    Ambulation/Gait Assistance 5: Supervision    Ambulation/Gait Assistance Details at end of session with cues for incr stance time on LLE    Ambulation Distance (Feet) 115 Feet    Assistive device Straight cane   with 3 prong   Gait Pattern Step-through pattern;Decreased step length - left;Decreased stance time - left;Decreased hip/knee flexion - left;Decreased arm swing - left    Ambulation Surface Level;Indoor      Neuro Re-ed    Neuro Re-ed Details  NMR: with therapist on pt's L and mirror in front for visual cue -  facilitating shifting weight towards LLE  (cues for standing tall through LLE and pushing into the ground) and then gently lifting off R heel from the floor x8 reps, and then shifting weight to L and stepping foot forwards to step x10 reps, then practicing transitional movements- reaching with RUE and taking a step towards L to grab a bean bag and then stepping back to midline and placing bean bag on chair to pt's R, then repeated by grabbing first to R and stepping back to midline and stepping forward into mini lunge towards L and dropping off bean bag x15 reps each direction to R/L,  grabbing small block from therapist and reaching overhead with RUE to put it on top of mirror x10 reps - shifting weight towards LLE first before reaching, with min guard at times for balance                    PT Short Term Goals - 10/11/20 1651      PT SHORT TERM GOAL #1   Title Pt will ambulate at least 500' outdoors with hurry cane over paved surfaces and perform a curb with supervision in order to demo improved community mobility.  ALL STGS DUE 11/08/20    Time 4    Period Weeks    Status New    Target Date 11/08/20      PT SHORT TERM GOAL #2   Title Pt will improve gait speed to at least 1.5 ft/sec with hurry cane in order to demo decr fall risk.    Baseline 1.35 ft/sec on 10/11/20    Time 4    Period Weeks    Status New      PT SHORT TERM GOAL #3   Title Pt will perform 4 steps with use of cane with supervision in order to demo improved community access.    Time 4    Period Weeks    Status New             PT Long Term Goals - 10/11/20 1655      PT LONG TERM GOAL #1   Title Pt and pt's spouse will be independent with final HEP in order to build upon functional gains made in therapy. ALL LTGS DUE 12/06/20    Baseline pt reports independence with HEP - will benefit from continued additions    Time 8    Period Weeks    Status New    Target Date 12/06/20      PT LONG TERM GOAL #2    Title Pt will ambulate small distances of 20-30' with no AD and supervision while holding a plate in order for pt to ambulate from his kitchen to the dining room safely.    Time 8    Period Weeks    Status New      PT LONG TERM GOAL #3   Title Pt will perform TUG in 19 seconds or less with LRAD in order to demo decr fall risk.    Baseline 23.91 seconds; 22.79 seconds with hurry cane    Time 8    Period Weeks    Status Revised      PT LONG TERM GOAL #4   Title Pt will ambulate outdoors over grass surfaces with supervision with LRAD  in order to demo improved community access and balance in his yard.    Time 8    Period Weeks    Status New      PT LONG TERM GOAL #5   Title Pt will improve gait speed to at least 1.6 ft/sec with hurry cane in order to demo decr fall risk.    Baseline 1.35 ft/sec    Time 8    Period Weeks    Status New                 Plan - 11/01/20 1534    Clinical Impression Statement Today's skilled session focused on NMR for incr weightshifting to LLE and working on transitional movements. Pt tolerated session well. At end of session with gait, pt demonstrating improved step length with RLE and L hip extension during terminal stance. Will continue to progress towards LTGs.    Personal Factors and Comorbidities Comorbidity 1;Past/Current Experience;Profession    Comorbidities HTN    Examination-Activity Limitations Stand;Stairs;Squat;Transfers;Locomotion Level    Examination-Participation Restrictions Community Activity;Occupation   walking his dog   Stability/Clinical Decision Making Stable/Uncomplicated    Rehab Potential Good    PT Frequency 1x / week    PT Duration 8 weeks    PT Treatment/Interventions ADLs/Self Care Home Management;Aquatic Therapy;Electrical Stimulation;DME Instruction;Gait training;Stair training;Functional mobility training;Therapeutic activities;Therapeutic exercise;Balance training;Neuromuscular re-education;Wheelchair mobility  training;Orthotic Fit/Training;Patient/family education;Passive range  of motion;Energy conservation;Vestibular    PT Next Visit Plan balance strategies on different surfaces/decr UE support and transitional movements, NMR/strengthening for LLE. SLS on LLE. continue tall kneeling. stretches for incr tone in RLE.    PT Home Exercise Plan [email or web address]    Consulted and Agree with Plan of Care Patient           Patient will benefit from skilled therapeutic intervention in order to improve the following deficits and impairments:  Abnormal gait,Decreased activity tolerance,Decreased balance,Decreased mobility,Decreased knowledge of use of DME,Decreased coordination,Decreased range of motion,Decreased strength,Difficulty walking,Impaired tone,Impaired sensation,Postural dysfunction  Visit Diagnosis: Unsteadiness on feet  Hemiplegia and hemiparesis following cerebral infarction affecting left non-dominant side (HCC)  Muscle weakness (generalized)  Other symptoms and signs involving the nervous system     Problem List Patient Active Problem List   Diagnosis Date Noted  . Elevated glucose 10/13/2020  . Hyperlipidemia associated with type 2 diabetes mellitus ([place]) 10/05/2020  . History of CVA (cerebrovascular accident) 01/19/2020  . Normocytic anemia   . Benign essential HTN   . Hyponatremia   . Acute blood loss anemia   . Hypotension due to drugs   . Vascular headache   . Prerenal azotemia   . Sleep disturbance   . Leukocytosis   . Transaminitis   . Marijuana abuse   . Essential hypertension   . Dysphagia, post-stroke   . Left hemiparesis ([place])   . Acute respiratory failure ([place])   . ICH (intracerebral hemorrhage) ([place]) 12/06/2019    Arliss Journey, PT, DPT  11/01/2020, 3:52 PM  [facility] [address] [place], [place], [address] Phone: [phone]   Fax:  [phone]  Name: Dennis Mercado MRN: [id number] Date  of Birth: 1981/02/09

## 2020-11-02 ENCOUNTER — Other Ambulatory Visit: Payer: Self-pay

## 2020-11-02 ENCOUNTER — Telehealth: Payer: Self-pay | Admitting: Family Medicine

## 2020-11-02 MED ORDER — LISINOPRIL 5 MG PO TABS: ORAL_TABLET | ORAL | 0 refills | Status: DC

## 2020-11-02 MED ORDER — ATORVASTATIN CALCIUM 40 MG PO TABS: ORAL_TABLET | ORAL | 0 refills | Status: DC

## 2020-11-02 MED ORDER — AMLODIPINE BESYLATE 10 MG PO TABS: ORAL_TABLET | ORAL | 0 refills | Status: DC

## 2020-11-02 NOTE — Telephone Encounter (Signed)
done

## 2020-11-02 NOTE — Telephone Encounter (Signed)
Pt called and said that his pharmacy is waiting on refill for meds, It was sent to [facility] on 10/29/20. 1. amLODipine (NORVASC) 10 MG tablet 2. atorvastatin (LIPITOR) 40 MG tablet 3. lisinopril (ZESTRIL) 5 MG tablet

## 2020-11-08 ENCOUNTER — Other Ambulatory Visit: Payer: Self-pay

## 2020-11-08 ENCOUNTER — Encounter: Payer: Managed Care, Other (non HMO) | Admitting: Occupational Therapy

## 2020-11-08 ENCOUNTER — Ambulatory Visit: Payer: Managed Care, Other (non HMO) | Admitting: Physical Therapy

## 2020-11-08 ENCOUNTER — Encounter: Payer: Self-pay | Admitting: Occupational Therapy

## 2020-11-08 ENCOUNTER — Ambulatory Visit: Payer: Managed Care, Other (non HMO) | Admitting: Occupational Therapy

## 2020-11-08 ENCOUNTER — Encounter: Payer: Self-pay | Admitting: Physical Therapy

## 2020-11-08 DIAGNOSIS — R2681 Unsteadiness on feet: Secondary | ICD-10-CM

## 2020-11-08 DIAGNOSIS — R29818 Other symptoms and signs involving the nervous system: Secondary | ICD-10-CM

## 2020-11-08 DIAGNOSIS — M6281 Muscle weakness (generalized): Secondary | ICD-10-CM

## 2020-11-08 DIAGNOSIS — I69354 Hemiplegia and hemiparesis following cerebral infarction affecting left non-dominant side: Secondary | ICD-10-CM

## 2020-11-08 DIAGNOSIS — R208 Other disturbances of skin sensation: Secondary | ICD-10-CM

## 2020-11-08 NOTE — Therapy (Signed)
[facility] [address] [place], [place], [address] Phone: [phone]   Fax:  [phone]  Physical Therapy Treatment  Patient Details  Name: Dennis Mercado MRN: [id number] Date of Birth: [birth year] Referring Provider (PT): [name], MD   Encounter Date: 11/08/2020   PT End of Session - 11/08/20 2113    Visit Number 13    Number of Visits 17    Authorization Type VL: 47 visits PT, 30 OT    Authorization - Visit Number 12    Authorization - Number of Visits 60    PT Start Time 8185    PT Stop Time 1445    PT Time Calculation (min) 43 min    Equipment Utilized During Treatment Gait belt    Activity Tolerance Patient tolerated treatment well    Behavior During Therapy [facility] for tasks assessed/performed           History reviewed. No pertinent past medical history.  Past Surgical History:  Procedure Laterality Date  . WISDOM TOOTH EXTRACTION      There were no vitals filed for this visit.   Subjective Assessment - 11/08/20 1404    Subjective No changes since he was last here.    Pertinent History HTN    Limitations Walking    Patient Stated Goals wants to work on not walking with the cane    Currently in Pain? No/denies                             [facility] Adult PT Treatment/Exercise - 11/08/20 1404      Ambulation/Gait   Ambulation/Gait Yes    Ambulation/Gait Assistance 5: Supervision    Ambulation/Gait Assistance Details small distance gait throughout session    Assistive device Straight cane   wiht 3 prong tip   Gait Pattern Step-through pattern;Decreased step length - left;Decreased stance time - left;Decreased hip/knee flexion - left;Decreased arm swing - left    Ambulation Surface Level;Indoor    Gait velocity 23.56 seconds = 1.39 ft/sec    Stairs Yes    Stairs Assistance 5: Supervision    Stairs Assistance Details (indicate cue type and reason) performed x1 rep with  step to pattern and use of cane, remaining 3 reps with single handrail and pt performing with step over step pattern with intermittent assist for incr hip/knee flexion to clear LLE when stepping up    Stair Management Technique Step to pattern;One rail Right;Alternating pattern    Number of Stairs 16    Height of Stairs 6      Neuro Re-ed    Neuro Re-ed Details  at staircase: holding onto handrail with RUE and having RLE on first step - LLE extended behind and working on pushing off and hip/knee flexion to bring LLE to tap 2nd step (intermittent assist to help prevent compensatory movements), then performed with LLE on first step for incr stance time and weight shifting x10 reps with each leg. at edge of mat table; half kneeling with LLE posteriorly - shifting weight onto LLE and then back to midline x10 reps, and bringing RLE forwards and backwards x3 reps for incr weight shifting to L - with pt needing UE support, then with LLE forwards - keeping balance, reaching with RUE towards target (therapist's hand) towards L x10 reps, needing intermittent UE assist and min guard. cues for posture throughout  Balance Exercises - 11/08/20 0001      Balance Exercises: Standing   SLS Eyes open;Modified;Limitations    SLS Limitations tapping RLE to 6" step - needing UE support on // bars x10 reps - cues for weight shifting first, 2 reps of letting go of bars and holding for 15 seconds as LLE being stance leg- cues for glute activation through LLE and tall posture - use of mirror for visual feedback    Rockerboard Anterior/posterior    Rockerboard Limitations with LLE as stance leg and using UE support - stepping RLE forwards off board and then backwards, incr RLE trembling during last rep, x3 reps               PT Short Term Goals - 11/08/20 1417      PT SHORT TERM GOAL #1   Title Pt will ambulate at least 500' outdoors with hurry cane over paved surfaces and perform a curb with  supervision in order to demo improved community mobility. ALL STGS DUE 11/08/20    Time 4    Period Weeks    Status New    Target Date 11/08/20      PT SHORT TERM GOAL #2   Title Pt will improve gait speed to at least 1.5 ft/sec with hurry cane in order to demo decr fall risk.    Baseline 1.35 ft/sec on 10/11/20; 23.56 seconds = 1.39 ft/sec on 11/08/20    Time 4    Period Weeks    Status Not Met      PT SHORT TERM GOAL #3   Title Pt will perform 4 steps with use of cane with supervision in order to demo improved community access.    Baseline met on 11/08/20    Time 4    Period Weeks    Status Achieved             PT Long Term Goals - 10/11/20 1655      PT LONG TERM GOAL #1   Title Pt and pt's spouse will be independent with final HEP in order to build upon functional gains made in therapy. ALL LTGS DUE 12/06/20    Baseline pt reports independence with HEP - will benefit from continued additions    Time 8    Period Weeks    Status New    Target Date 12/06/20      PT LONG TERM GOAL #2   Title Pt will ambulate small distances of 20-30' with no AD and supervision while holding a plate in order for pt to ambulate from his kitchen to the dining room safely.    Time 8    Period Weeks    Status New      PT LONG TERM GOAL #3   Title Pt will perform TUG in 19 seconds or less with LRAD in order to demo decr fall risk.    Baseline 23.91 seconds; 22.79 seconds with hurry cane    Time 8    Period Weeks    Status Revised      PT LONG TERM GOAL #4   Title Pt will ambulate outdoors over grass surfaces with supervision with LRAD  in order to demo improved community access and balance in his yard.    Time 8    Period Weeks    Status New      PT LONG TERM GOAL #5   Title Pt will improve gait speed to at least 1.6 ft/sec with hurry cane  in order to demo decr fall risk.    Baseline 1.35 ft/sec    Time 8    Period Weeks    Status New                 Plan - 11/08/20 2114     Clinical Impression Statement Performed stair training today with pt able to perform with single handrail and step over step pattern with intermittent assist for LLE hip and knee flexion, able to perform with supervision. Performed half kneeling activities today for core stability, incr LLE weight shift and proximal strengthening. Pt tolerated well, had incr difficulties with balance when LLE was anteriorly. Pt met STG #3 in regards to stairs, did not meet STG #2 in regards to gait speed, slightly improved, but not quite to goal level. Will continue to progress towards LTGs.    Personal Factors and Comorbidities Comorbidity 1;Past/Current Experience;Profession    Comorbidities HTN    Examination-Activity Limitations Stand;Stairs;Squat;Transfers;Locomotion Level    Examination-Participation Restrictions Community Activity;Occupation   walking his dog   Stability/Clinical Decision Making Stable/Uncomplicated    Rehab Potential Good    PT Frequency 1x / week    PT Duration 8 weeks    PT Treatment/Interventions ADLs/Self Care Home Management;Aquatic Therapy;Electrical Stimulation;DME Instruction;Gait training;Stair training;Functional mobility training;Therapeutic activities;Therapeutic exercise;Balance training;Neuromuscular re-education;Wheelchair mobility training;Orthotic Fit/Training;Patient/family education;Passive range of motion;Energy conservation;Vestibular    PT Next Visit Plan tall kneel and half kneeling, balance strategies on different surfaces/decr UE support and working on transitional movements, NMR/strengthening for LLE. SLS on LLE.    PT Home Exercise Plan [id number]    Consulted and Agree with Plan of Care Patient           Patient will benefit from skilled therapeutic intervention in order to improve the following deficits and impairments:  Abnormal gait,Decreased activity tolerance,Decreased balance,Decreased mobility,Decreased knowledge of use of DME,Decreased  coordination,Decreased range of motion,Decreased strength,Difficulty walking,Impaired tone,Impaired sensation,Postural dysfunction  Visit Diagnosis: Unsteadiness on feet  Hemiplegia and hemiparesis following cerebral infarction affecting left non-dominant side (HCC)  Muscle weakness (generalized)  Other symptoms and signs involving the nervous system     Problem List Patient Active Problem List   Diagnosis Date Noted  . Elevated glucose 10/13/2020  . Hyperlipidemia associated with type 2 diabetes mellitus ([place]) 10/05/2020  . History of CVA (cerebrovascular accident) 01/19/2020  . Normocytic anemia   . Benign essential HTN   . Hyponatremia   . Acute blood loss anemia   . Hypotension due to drugs   . Vascular headache   . Prerenal azotemia   . Sleep disturbance   . Leukocytosis   . Transaminitis   . Marijuana abuse   . Essential hypertension   . Dysphagia, post-stroke   . Left hemiparesis ([place])   . Acute respiratory failure ([place])   . ICH (intracerebral hemorrhage) ([place]) 12/06/2019    Arliss Journey, PT, DPT  11/08/2020, 9:22 PM  [facility] [name] [place], [place], [address] Phone: [phone]   Fax:  [phone]  Name: Dennis Mercado MRN: [id number] Date of Birth: 1980/12/31

## 2020-11-08 NOTE — Therapy (Signed)
[facility] [address] [place], [place], [address] Phone: [phone]   Fax:  [phone]  Occupational Therapy Treatment  Patient Details  Name: Dennis Mercado MRN: [id number] Date of Birth: June 08, 1981 Referring Provider (OT): Dr. Letta Pate   Encounter Date: 11/08/2020   OT End of Session - 11/08/20 1512    Visit Number 14    Number of Visits 16    Date for OT Re-Evaluation 11/24/20    Authorization - Visit Number 14    Authorization - Number of Visits 16    OT Start Time 8341    OT Stop Time 1400    OT Time Calculation (min) 45 min    Activity Tolerance Patient tolerated treatment well    Behavior During Therapy [facility] for tasks assessed/performed           History reviewed. No pertinent past medical history.  Past Surgical History:  Procedure Laterality Date  . WISDOM TOOTH EXTRACTION      There were no vitals filed for this visit.   Subjective Assessment - 11/08/20 1503    Subjective  I walked to the end of my driveway to check the mailbox (significant hill)    Pertinent History Pt. presented to ED on 12/06/2019 with left side weakness and  altered mental status s/p coitus.  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 Pt required intubation 6/22 due to ARF secondary to aspiration PNA. Received inpatient rehab. Pt was discharged 7/28/2    Limitations HTN, left inattention, apraxia    Patient Stated Goals to be able to use my hand    Currently in Pain? No/denies    Pain Score 0-No pain                        OT Treatments/Exercises (OP) - 11/08/20 0001      Neurological Re-education Exercises   Other Exercises 1 Neuromuscular reeducation to address increasing range of motin and active controlled movement in LUE.  Worked on transitioning from sidesitting to 4point toward right - placing increased demand on  left limbs.  Patient worked initially in [employer] with closed hand, but quickly could transition to open hand with min support at IP for gentle flex.  Worked on weight shifting backward and laterally with emphasis on extended active elbow to simulate reaching patterns and to prepare shoulder and wrist stretch.  Patient able to complete at home as HEP - see patient instructions.  Practiced sfae transition to/from floor to ensure he could safely perform with wife at home.                  OT Education - 11/08/20 1512    Education Details exercises in 4 point    Person(s) Educated Patient    Methods Explanation;Demonstration;Tactile cues;Verbal cues;Handout    Comprehension Verbalized understanding;Returned demonstration;Tactile cues required;Need further instruction            OT Short Term Goals - 11/08/20 1516      OT SHORT TERM GOAL #1   Title I with inital HEP    Time 4    Period Weeks    Status Achieved      OT SHORT TERM GOAL #2   Title Patient will demonstrate low reach to 30 degrees of shoulder flexion with min assist    Time 4    Period Weeks    Status  Achieved      OT SHORT TERM GOAL #3   Title Patient will demonstrate 30 degrees of active assited wrist and forearm motion    Time 4    Period Weeks    Status Achieved      OT SHORT TERM GOAL #4   Title xxx      OT SHORT TERM GOAL #5   Title xxx             OT Long Term Goals - 11/08/20 1330      OT LONG TERM GOAL #1   Title I with aquatic HEP    Time 8    Period Weeks    Status On-going      OT LONG TERM GOAL #2   Title Patient will demonstrate low reach to 45 degrees of shoulder flexion with elbow extension    Time 8    Period Weeks    Status Achieved      OT LONG TERM GOAL #3   Title Patient will demonstrate no pain with passive range of motion to 120 shoulder flexion/abduction    Time 8    Period Weeks    Status Achieved      OT LONG TERM GOAL #4   Title xxx      OT LONG TERM GOAL #5    Title xxx      OT LONG TERM GOAL #6   Title xxx                 Plan - 11/08/20 1513    Clinical Impression Statement Patient continues to show improved movement with left UE while seated - need continued work to improve functional use and freedom of movement when walking or standing.  Need additional emphasis on postural alignment and control  Anticipate extending for an additional 8 weeks.    OT Occupational Profile and History Detailed Assessment- Review of Records and additional review of physical, cognitive, psychosocial history related to current functional performance    Occupational performance deficits (Please refer to evaluation for details): ADL's;IADL's;Leisure;Social Participation;Work;Play    Body Structure / Function / Physical Skills ADL;Balance;Endurance;Mobility;Strength;UE functional use;FMC;Vision;Coordination;Gait;Decreased knowledge of precautions;GMC;ROM;Decreased knowledge of use of DME;Dexterity;IADL;Sensation;Tone;Body Banker;Safety Awareness;Thought;Understand    Rehab Potential Good    Clinical Decision Making Limited treatment options, no task modification necessary    Comorbidities Affecting Occupational Performance: May have comorbidities impacting occupational performance    Modification or Assistance to Complete Evaluation  No modification of tasks or assist necessary to complete eval    OT Frequency 2x / week    OT Duration 8 weeks    OT Treatment/Interventions Self-care/ADL training;Therapeutic exercise;Functional Mobility Training;Balance training;Splinting;Manual Therapy;Neuromuscular education;Aquatic Therapy;Ultrasound;Energy conservation;Therapeutic activities;Cryotherapy;Paraffin;DME and/or AE instruction;Cognitive remediation/compensation;Visual/perceptual remediation/compensation;Gait Training;Fluidtherapy;Electrical Stimulation;Moist Heat;Contrast Bath;Passive range of motion;Patient/family  education    Plan aquatic therapy and clinic therapy to address spasticity, need left trunk lengthening/right trunk shortening, realignment of shoulder girdle, and isolated glenohumeral joint motion    Consulted and Agree with Plan of Care Patient    Family Member Consulted wife April           Patient will benefit from skilled therapeutic intervention in order to improve the following deficits and impairments:   Body Structure / Function / Physical Skills: ADL,Balance,Endurance,Mobility,Strength,UE functional use,FMC,Vision,Coordination,Gait,Decreased knowledge of precautions,GMC,ROM,Decreased knowledge of use of DME,Dexterity,IADL,Sensation,Tone,Body mechanics Cognitive Skills: Attention,Memory,Problem Solve,Safety Awareness,Thought,Understand     Visit Diagnosis: Hemiplegia and hemiparesis following cerebral infarction affecting left non-dominant side ([place])  Muscle weakness (generalized)  Other symptoms and signs involving the nervous system  Other disturbances of skin sensation  Unsteadiness on feet    Problem List Patient Active Problem List   Diagnosis Date Noted  . Elevated glucose 10/13/2020  . Hyperlipidemia associated with type 2 diabetes mellitus ([place]) 10/05/2020  . History of CVA (cerebrovascular accident) 01/19/2020  . Normocytic anemia   . Benign essential HTN   . Hyponatremia   . Acute blood loss anemia   . Hypotension due to drugs   . Vascular headache   . Prerenal azotemia   . Sleep disturbance   . Leukocytosis   . Transaminitis   . Marijuana abuse   . Essential hypertension   . Dysphagia, post-stroke   . Left hemiparesis ([place])   . Acute respiratory failure ([place])   . ICH (intracerebral hemorrhage) ([place]) 12/06/2019    Mariah Milling, OTR/L 11/08/2020, 3:17 PM  [facility] [address] [place], [place], [address] Phone: [phone]   Fax:  [phone]  Name: Avion Kutzer MRN:  [id number] Date of Birth: [birth year]

## 2020-11-08 NOTE — Patient Instructions (Signed)
Hands and knees on the floor Take a moment to get left hand lined up.   1)  Spread shoulder blades apart - relax head 2) Rock slowly backward toward your heels - keep elbow straight 3)  Rock slowly towards left then right - keep elbow straight

## 2020-11-15 ENCOUNTER — Ambulatory Visit: Payer: Managed Care, Other (non HMO)

## 2020-11-20 ENCOUNTER — Encounter: Payer: Self-pay | Admitting: Occupational Therapy

## 2020-11-20 ENCOUNTER — Ambulatory Visit: Payer: Managed Care, Other (non HMO) | Attending: Physician Assistant | Admitting: Occupational Therapy

## 2020-11-20 DIAGNOSIS — R2689 Other abnormalities of gait and mobility: Secondary | ICD-10-CM | POA: Diagnosis present

## 2020-11-20 DIAGNOSIS — R208 Other disturbances of skin sensation: Secondary | ICD-10-CM | POA: Diagnosis present

## 2020-11-20 DIAGNOSIS — R2681 Unsteadiness on feet: Secondary | ICD-10-CM | POA: Insufficient documentation

## 2020-11-20 DIAGNOSIS — I69354 Hemiplegia and hemiparesis following cerebral infarction affecting left non-dominant side: Secondary | ICD-10-CM | POA: Insufficient documentation

## 2020-11-20 DIAGNOSIS — M6281 Muscle weakness (generalized): Secondary | ICD-10-CM | POA: Diagnosis present

## 2020-11-20 NOTE — Therapy (Signed)
[facility] [address] [place], [place], [address] Phone: [phone]   Fax:  [phone]  Occupational Therapy Treatment  Patient Details  Name: Dennis Mercado MRN: [id number] Date of Birth: [birth year] Referring Provider (OT): Dr. Letta Pate   Encounter Date: 11/20/2020   OT End of Session - 11/20/20 1830    Visit Number 15    Number of Visits 28    Date for OT Re-Evaluation 02/18/21    Authorization Type recertify x 12 weeks    OT Start Time 1410    OT Stop Time 1500    OT Time Calculation (min) 50 min    Activity Tolerance Patient tolerated treatment well    Behavior During Therapy [facility] for tasks assessed/performed           History reviewed. No pertinent past medical history.  Past Surgical History:  Procedure Laterality Date  . WISDOM TOOTH EXTRACTION      There were no vitals filed for this visit.   Subjective Assessment - 11/20/20 1826    Subjective  Patient is taking classes to get insurance license.  "I am stiff today!"    Patient is accompanied by: Family member    Pertinent History Pt. presented to ED on 12/06/2019 with left side weakness and  altered mental status s/p coitus.  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 Pt required intubation 6/22 due to ARF secondary to aspiration PNA. Received inpatient rehab. Pt was discharged 7/28/2    Limitations HTN, left inattention, apraxia    Patient Stated Goals to be able to use my hand    Currently in Pain? No/denies    Pain Score 0-No pain              Patient seen for aquatic therapy visit.  Patient entered and exited pool via stairs and min assist to help tibia track forward during descent, and ascent with left leg loading.    Session occurred in 3.5-4.5 ft of water.  Continuing to work to improve postural control to allow more free limb movement.  Patient,  when challenged physically, walking long distance, on uneven terrain, etc.  Experiences increased tension in synergistic pattern.  Patient in seated position has much more movement accessible to him in LUE.   Working to increase left arm movement in standing and walking conditions.                      OT Short Term Goals - 11/20/20 1832      OT SHORT TERM GOAL #1   Title I with inital HEP    Time 4    Period Weeks    Status Achieved      OT SHORT TERM GOAL #2   Title Patient will demonstrate low reach to 30 degrees of shoulder flexion with min assist    Time 4    Period Weeks    Status Achieved      OT SHORT TERM GOAL #3   Title Patient will demonstrate 30 degrees of active assited wrist and forearm motion    Time 4    Period Weeks    Status Achieved      OT SHORT TERM GOAL #4   Title Patient will walk 20 feet and carry lightweight object using his left arm.    Time 6    Period Weeks    Status New  Target Date 01/04/21      OT SHORT TERM GOAL #5   Title Patient will transport himself to aquatic therapy without assistance    Time 6    Period Weeks    Status New    Target Date 01/04/21             OT Long Term Goals - 11/20/20 1834      OT LONG TERM GOAL #1   Title I with aquatic HEP    Time 8    Period Weeks    Status On-going      OT LONG TERM GOAL #2   Title Patient will demonstrate low reach to 45 degrees of shoulder flexion with elbow extension    Time 8    Period Weeks    Status Achieved      OT LONG TERM GOAL #3   Title Patient will demonstrate no pain with passive range of motion to 120 shoulder flexion/abduction    Time 8    Period Weeks    Status Achieved      OT LONG TERM GOAL #4   Title Patient will incorporate self stretching techniques throughout the day whether at home or work.    Time 12    Period Weeks    Status New    Target Date 02/18/21      OT LONG TERM GOAL #5   Title Patient will reach to hit target at at  least 70 degrees of shoulder flexion while upright    Time 12    Period Weeks    Status New      OT LONG TERM GOAL #6   Title xxx                 Plan - 11/20/20 1828    Clinical Impression Statement Patient continues to show improved movement with left UE while seated - need continued work to improve functional use and freedom of movement when walking or standing.  Need additional emphasis on postural alignment and control  Anticipate extending for an additional 8 weeks to further address these issues to help patient be more independent and efficient as he hopes to return to full time employment.    OT Occupational Profile and History Detailed Assessment- Review of Records and additional review of physical, cognitive, psychosocial history related to current functional performance    Occupational performance deficits (Please refer to evaluation for details): ADL's;IADL's;Leisure;Social Participation;Work;Play    Body Structure / Function / Physical Skills ADL;Balance;Endurance;Mobility;Strength;UE functional use;FMC;Vision;Coordination;Gait;Decreased knowledge of precautions;GMC;ROM;Decreased knowledge of use of DME;Dexterity;IADL;Sensation;Tone;Body Banker;Safety Awareness;Thought;Understand    Rehab Potential Good    Clinical Decision Making Limited treatment options, no task modification necessary    Comorbidities Affecting Occupational Performance: May have comorbidities impacting occupational performance    Modification or Assistance to Complete Evaluation  No modification of tasks or assist necessary to complete eval    OT Frequency 1x / week    OT Duration 12 weeks   12 weeks   OT Treatment/Interventions Self-care/ADL training;Therapeutic exercise;Functional Mobility Training;Balance training;Splinting;Manual Therapy;Neuromuscular education;Aquatic Therapy;Ultrasound;Energy conservation;Therapeutic  activities;Cryotherapy;Paraffin;DME and/or AE instruction;Cognitive remediation/compensation;Visual/perceptual remediation/compensation;Gait Training;Fluidtherapy;Electrical Stimulation;Moist Heat;Contrast Bath;Passive range of motion;Patient/family education    Plan aquatic therapy and clinic therapy to address spasticity, need left trunk lengthening/right trunk shortening, realignment of shoulder girdle, and isolated glenohumeral joint motion    Consulted and Agree with Plan of Care Patient    Family Member Consulted wife April  Patient will benefit from skilled therapeutic intervention in order to improve the following deficits and impairments:   Body Structure / Function / Physical Skills: ADL,Balance,Endurance,Mobility,Strength,UE functional use,FMC,Vision,Coordination,Gait,Decreased knowledge of precautions,GMC,ROM,Decreased knowledge of use of DME,Dexterity,IADL,Sensation,Tone,Body mechanics Cognitive Skills: Attention,Memory,Problem Solve,Safety Awareness,Thought,Understand     Visit Diagnosis: Hemiplegia and hemiparesis following cerebral infarction affecting left non-dominant side ([place]) - Plan: Ot plan of care cert/re-cert  Muscle weakness (generalized) - Plan: Ot plan of care cert/re-cert  Other disturbances of skin sensation - Plan: Ot plan of care cert/re-cert  Unsteadiness on feet - Plan: Ot plan of care cert/re-cert    Problem List Patient Active Problem List   Diagnosis Date Noted  . Elevated glucose 10/13/2020  . Hyperlipidemia associated with type 2 diabetes mellitus ([place]) 10/05/2020  . History of CVA (cerebrovascular accident) 01/19/2020  . Normocytic anemia   . Benign essential HTN   . Hyponatremia   . Acute blood loss anemia   . Hypotension due to drugs   . Vascular headache   . Prerenal azotemia   . Sleep disturbance   . Leukocytosis   . Transaminitis   . Marijuana abuse   . Essential hypertension   . Dysphagia, post-stroke   . Left  hemiparesis ([place])   . Acute respiratory failure ([place])   . ICH (intracerebral hemorrhage) ([place]) 12/06/2019    Mariah Milling 11/20/2020, 7:00 PM  [facility] [address] [place], [place], [address] Phone: [phone]   Fax:  [phone]  Name: Dennis Mercado MRN: [id number] Date of Birth: 1981-01-15

## 2020-11-22 ENCOUNTER — Ambulatory Visit: Payer: Managed Care, Other (non HMO)

## 2020-11-29 ENCOUNTER — Other Ambulatory Visit: Payer: Self-pay

## 2020-11-29 ENCOUNTER — Ambulatory Visit: Payer: Managed Care, Other (non HMO)

## 2020-11-29 DIAGNOSIS — R2689 Other abnormalities of gait and mobility: Secondary | ICD-10-CM

## 2020-11-29 DIAGNOSIS — M6281 Muscle weakness (generalized): Secondary | ICD-10-CM

## 2020-11-29 DIAGNOSIS — R2681 Unsteadiness on feet: Secondary | ICD-10-CM

## 2020-11-29 DIAGNOSIS — I69354 Hemiplegia and hemiparesis following cerebral infarction affecting left non-dominant side: Secondary | ICD-10-CM | POA: Diagnosis not present

## 2020-11-29 NOTE — Therapy (Signed)
[facility] [address] [place], [place], [address] Phone: [phone]   Fax:  [phone]  Physical Therapy Treatment  Patient Details  Name: Dennis Mercado MRN: [id number] Date of Birth: 04/10/1981 Referring Provider (PT): [name], MD   Encounter Date: 11/29/2020   PT End of Session - 11/29/20 1405     Visit Number 14    Number of Visits 17    Authorization Type VL: 43 visits PT, 30 OT    Authorization - Visit Number 13    Authorization - Number of Visits 60    PT Start Time 6962    PT Stop Time 1445    PT Time Calculation (min) 42 min    Equipment Utilized During Treatment Gait belt    Activity Tolerance Patient tolerated treatment well    Behavior During Therapy [facility] for tasks assessed/performed             History reviewed. No pertinent past medical history.  Past Surgical History:  Procedure Laterality Date   WISDOM TOOTH EXTRACTION      There were no vitals filed for this visit.   Subjective Assessment - 11/29/20 1405     Subjective Patient reports that he has been stiff due to being in class, has been stretching on breaks.    Pertinent History HTN    Limitations Walking    Patient Stated Goals wants to work on not walking with the cane    Currently in Pain? No/denies                               [facility] Adult PT Treatment/Exercise - 11/29/20 0001       Transfers   Transfers Sit to Stand;Stand to Sit    Sit to Stand 6: Modified independent (Device/Increase time)    Stand to Sit 6: Modified independent (Device/Increase time)      Ambulation/Gait   Ambulation/Gait Yes    Ambulation/Gait Assistance 5: Supervision;4: Min guard    Ambulation/Gait Assistance Details completed ambulation x 115 ft with SPC (3 prong tip) with focus on improved step length to promote stance time on LLE. THen completed x 115 ft without AD, PT providing faciliation at pelvis for  improved weight shift and step length to further promote improved gait pattern. Intermittent rest breaks required due to patient being tense, cues for relaxation and arm swing required.    Ambulation Distance (Feet) 115 Feet   x1, 115 x 1   Assistive device Straight cane;None    Gait Pattern Step-through pattern;Decreased step length - left;Decreased stance time - left;Decreased hip/knee flexion - left;Decreased arm swing - left    Ambulation Surface Level;Outdoor      Neuro Re-ed    Neuro Re-ed Details  at [place] for support as needed, completed standing taps forward to target and backwards to target (colored floor dots with RLE to promote weight shift/stance on LLE, completed x 15 reps with PT providing cues for standing tall/posture and glute activation. Increased challenge with posterior stepping. At countertop completed steps forward/backward over black beam with LLE x 15 reps with PT providing resistance to patient's tolerance at distal tib/fib for improved LLE strengthening. Increased challenge/weakness noted with posterior chain muscle activation.      Exercises   Exercises Other Exercises    Other Exercises  Pt asking about seated exercise bike. PT educating on options bicycle pedaler vs. stepper and  showed purchase options. Trialed the pedaler but patient having difficulty maintaining LLE in device to allow for completion. Trialed stepper with patient able to complete appropriately. WIll trial NuStep vs. Stepper at next session to allow if patient can return to gym with wife to complete this.                    PT Education - 11/29/20 1712     Education Details potential to attend gym complete NuStep or Stepper for improved reciprocal motion vs. purchasing options.    Person(s) Educated Patient    Methods Explanation;Demonstration    Comprehension Verbalized understanding;Returned demonstration              PT Short Term Goals - 11/08/20 1417       PT  SHORT TERM GOAL #1   Title Pt will ambulate at least 500' outdoors with hurry cane over paved surfaces and perform a curb with supervision in order to demo improved community mobility. ALL STGS DUE 11/08/20    Time 4    Period Weeks    Status New    Target Date 11/08/20      PT SHORT TERM GOAL #2   Title Pt will improve gait speed to at least 1.5 ft/sec with hurry cane in order to demo decr fall risk.    Baseline 1.35 ft/sec on 10/11/20; 23.56 seconds = 1.39 ft/sec on 11/08/20    Time 4    Period Weeks    Status Not Met      PT SHORT TERM GOAL #3   Title Pt will perform 4 steps with use of cane with supervision in order to demo improved community access.    Baseline met on 11/08/20    Time 4    Period Weeks    Status Achieved               PT Long Term Goals - 10/11/20 1655       PT LONG TERM GOAL #1   Title Pt and pt's spouse will be independent with final HEP in order to build upon functional gains made in therapy. ALL LTGS DUE 12/06/20    Baseline pt reports independence with HEP - will benefit from continued additions    Time 8    Period Weeks    Status New    Target Date 12/06/20      PT LONG TERM GOAL #2   Title Pt will ambulate small distances of 20-30' with no AD and supervision while holding a plate in order for pt to ambulate from his kitchen to the dining room safely.    Time 8    Period Weeks    Status New      PT LONG TERM GOAL #3   Title Pt will perform TUG in 19 seconds or less with LRAD in order to demo decr fall risk.    Baseline 23.91 seconds; 22.79 seconds with hurry cane    Time 8    Period Weeks    Status Revised      PT LONG TERM GOAL #4   Title Pt will ambulate outdoors over grass surfaces with supervision with LRAD  in order to demo improved community access and balance in his yard.    Time 8    Period Weeks    Status New      PT LONG TERM GOAL #5   Title Pt will improve gait speed to at least 1.6 ft/sec with hurry  cane in order to demo  decr fall risk.    Baseline 1.35 ft/sec    Time 8    Period Weeks    Status New                   Plan - 11/29/20 1721     Clinical Impression Statement Continued gait training today with and without AD with focus on improved LLE weight shift and stance time. Continued NMR focusing on improved weight shift as well as functional LLE strengthening. Patient tolerating well. Will conitnue to progress toward all LTGs.    Personal Factors and Comorbidities Comorbidity 1;Past/Current Experience;Profession    Comorbidities HTN    Examination-Activity Limitations Stand;Stairs;Squat;Transfers;Locomotion Level    Examination-Participation Restrictions Community Activity;Occupation   walking his dog   Stability/Clinical Decision Making Stable/Uncomplicated    Rehab Potential Good    PT Frequency 1x / week    PT Duration 8 weeks    PT Treatment/Interventions ADLs/Self Care Home Management;Aquatic Therapy;Electrical Stimulation;DME Instruction;Gait training;Stair training;Functional mobility training;Therapeutic activities;Therapeutic exercise;Balance training;Neuromuscular re-education;Wheelchair mobility training;Orthotic Fit/Training;Patient/family education;Passive range of motion;Energy conservation;Vestibular    PT Next Visit Plan Check LTGs and Re-Cert for 1x/week for 8 weeks. Trial Nustep. tall kneel and half kneeling, balance strategies on different surfaces/decr UE support and working on transitional movements, NMR/strengthening for LLE. SLS on LLE.    PT Home Exercise Plan [id number]    Consulted and Agree with Plan of Care Patient             Patient will benefit from skilled therapeutic intervention in order to improve the following deficits and impairments:  Abnormal gait, Decreased activity tolerance, Decreased balance, Decreased mobility, Decreased knowledge of use of DME, Decreased coordination, Decreased range of motion, Decreased strength, Difficulty walking, Impaired tone,  Impaired sensation, Postural dysfunction  Visit Diagnosis: Muscle weakness (generalized)  Unsteadiness on feet  Other abnormalities of gait and mobility     Problem List Patient Active Problem List   Diagnosis Date Noted   Elevated glucose 10/13/2020   Hyperlipidemia associated with type 2 diabetes mellitus ([place]) 10/05/2020   History of CVA (cerebrovascular accident) 01/19/2020   Normocytic anemia    Benign essential HTN    Hyponatremia    Acute blood loss anemia    Hypotension due to drugs    Vascular headache    Prerenal azotemia    Sleep disturbance    Leukocytosis    Transaminitis    Marijuana abuse    Essential hypertension    Dysphagia, post-stroke    Left hemiparesis ([place])    Acute respiratory failure (HCC)    ICH (intracerebral hemorrhage) ([place]) 12/06/2019    Jones Bales, PT, DPT 11/29/2020, 5:23 PM  [facility] [address] [place], [place], [address] Phone: [phone]   Fax:  [phone]  Name: Wm Sahagun MRN: [id number] Date of Birth: 02-Oct-1980

## 2020-12-04 ENCOUNTER — Ambulatory Visit: Payer: Managed Care, Other (non HMO) | Admitting: Occupational Therapy

## 2020-12-06 ENCOUNTER — Ambulatory Visit: Payer: Managed Care, Other (non HMO) | Admitting: Physical Therapy

## 2020-12-06 ENCOUNTER — Other Ambulatory Visit: Payer: Managed Care, Other (non HMO)

## 2020-12-07 ENCOUNTER — Ambulatory Visit: Payer: Managed Care, Other (non HMO) | Admitting: Neurology

## 2020-12-07 DIAGNOSIS — I69398 Other sequelae of cerebral infarction: Secondary | ICD-10-CM

## 2020-12-07 DIAGNOSIS — R569 Unspecified convulsions: Secondary | ICD-10-CM

## 2020-12-08 ENCOUNTER — Other Ambulatory Visit: Payer: Self-pay

## 2020-12-08 ENCOUNTER — Ambulatory Visit: Payer: Managed Care, Other (non HMO)

## 2020-12-08 DIAGNOSIS — I69354 Hemiplegia and hemiparesis following cerebral infarction affecting left non-dominant side: Secondary | ICD-10-CM | POA: Diagnosis not present

## 2020-12-08 DIAGNOSIS — R2681 Unsteadiness on feet: Secondary | ICD-10-CM

## 2020-12-08 DIAGNOSIS — M6281 Muscle weakness (generalized): Secondary | ICD-10-CM

## 2020-12-08 DIAGNOSIS — R2689 Other abnormalities of gait and mobility: Secondary | ICD-10-CM

## 2020-12-08 NOTE — Therapy (Signed)
[facility] [address] [place], [place], [address] Phone: [phone]   Fax:  [phone]  Physical Therapy Treatment/Re-Certification  Patient Details  Name: Dennis Mercado MRN: [id number] Date of Birth: 1981-03-30 Referring Provider (PT): [name], MD   Encounter Date: 12/08/2020   PT End of Session - 12/08/20 1317     Visit Number 15    Number of Visits 22    Date for PT Re-Evaluation 02/02/21    Authorization Type VL: 60 visits PT, 30 OT    Authorization - Visit Number 14    Authorization - Number of Visits 60    PT Start Time 3299    PT Stop Time 1400    PT Time Calculation (min) 43 min    Equipment Utilized During Treatment Gait belt    Activity Tolerance Patient tolerated treatment well    Behavior During Therapy [facility] for tasks assessed/performed             History reviewed. No pertinent past medical history.  Past Surgical History:  Procedure Laterality Date   WISDOM TOOTH EXTRACTION      There were no vitals filed for this visit.   Subjective Assessment - 12/08/20 1321     Subjective No new changes/complaints. Overall doing well. Reports he had an interview and got the job!    Pertinent History HTN    Limitations Walking    Patient Stated Goals wants to work on not walking with the cane    Currently in Pain? No/denies                [facility] PT Assessment - 12/08/20 0001       Assessment   Medical Diagnosis R thalamic hemorrhage w/ L hemiparesis    Referring Provider (PT) Kirsteins, [name], MD                 [facility] Adult PT Treatment/Exercise - 12/08/20 0001       Transfers   Transfers Sit to Stand;Stand to Sit    Sit to Stand 6: Modified independent (Device/Increase time)    Stand to Sit 6: Modified independent (Device/Increase time)      Ambulation/Gait   Ambulation/Gait Yes    Ambulation/Gait Assistance 5: Supervision    Ambulation/Gait Assistance  Details Completed ambulation x 115 ft without AD and carrying plate to simulate walking in household from kitchen etc. CGA required intially due to imbalance, but then supervision the rest of the time. Completed ambulation outdoors on paved/grass surfaces x 400 ft with hurrycane, supervision throughout. Continue to demo decreased step length with RLE due to decreased stance time on LLE.    Ambulation Distance (Feet) 115 Feet   x 1 without AD; x 400 ft outdoors on unlevel surfaces (grass/pavement surfaces)   Assistive device Straight cane;None    Gait Pattern Step-through pattern;Decreased step length - left;Decreased stance time - left;Decreased hip/knee flexion - left;Decreased arm swing - left    Ambulation Surface Level;Indoor;Unlevel;Outdoor;Paved;Gravel;Grass    Gait velocity 19.89 secs = 1.65 ft/sec   w/ hurrycane     Timed Up and Go Test   TUG Normal TUG    Normal TUG (seconds) 21.32   w/  hurrycane     Exercises   Exercises Knee/Hip      Knee/Hip Exercises: Aerobic   Other Aerobic Patient is interested in going to [employer] with wife and interested in [employer]. Trailed similar machine today. Completed SciFit on level 2.0  x 4 minutes, then progressed to 4.5 mph x 4 minutes. Patient reports feeling comfortable with machine and tolerating very well today.               PT Education - 12/08/20 1553     Education Details How to use NuStep/SciFit; Updated POC; Progress toward LTGs    Person(s) Educated Patient    Methods Explanation    Comprehension Verbalized understanding              PT Short Term Goals - 11/08/20 1417       PT SHORT TERM GOAL #1   Title Pt will ambulate at least 500' outdoors with hurry cane over paved surfaces and perform a curb with supervision in order to demo improved community mobility. ALL STGS DUE 11/08/20    Time 4    Period Weeks    Status New    Target Date 11/08/20      PT SHORT TERM GOAL #2   Title Pt will improve gait  speed to at least 1.5 ft/sec with hurry cane in order to demo decr fall risk.    Baseline 1.35 ft/sec on 10/11/20; 23.56 seconds = 1.39 ft/sec on 11/08/20    Time 4    Period Weeks    Status Not Met      PT SHORT TERM GOAL #3   Title Pt will perform 4 steps with use of cane with supervision in order to demo improved community access.    Baseline met on 11/08/20    Time 4    Period Weeks    Status Achieved               PT Long Term Goals - 12/08/20 1323       PT LONG TERM GOAL #1   Title Pt and pt's spouse will be independent with final HEP in order to build upon functional gains made in therapy. ALL LTGS DUE 12/06/20    Baseline pt reports independence with HEP, will continue to benefit from progressive HEP    Time 8    Period Weeks    Status On-going      PT LONG TERM GOAL #2   Title Pt will ambulate small distances of 20-30' with no AD and supervision while holding a plate in order for pt to ambulate from his kitchen to the dining room safely.    Baseline 130' carrying plate intermittent supervision/CGA    Time 8    Period Weeks    Status Partially Met      PT LONG TERM GOAL #3   Title Pt will perform TUG in 19 seconds or less with LRAD in order to demo decr fall risk.    Baseline 23.91 seconds; 22.79 seconds with hurry cane; 21.32 secs w/ hurrycane    Time 8    Period Weeks    Status Not Met      PT LONG TERM GOAL #4   Title Pt will ambulate outdoors over grass surfaces with supervision with LRAD  in order to demo improved community access and balance in his yard.    Baseline 300 ft outdoors supervision    Time 8    Period Weeks    Status Achieved      PT LONG TERM GOAL #5   Title Pt will improve gait speed to at least 1.6 ft/sec with hurry cane in order to demo decr fall risk.    Baseline 1.35 ft/sec; 1.65 ft/sec  Time 8    Period Weeks    Status Achieved              Short Term Goals:   PT Short Term Goals - 12/08/20 1632       PT SHORT TERM  GOAL #1   Title Pt will report return to gym at least 2x/week for participation on stationary stepper for improved endurance/strength (All STGS: 01/05/21)    Baseline has not went at this time.    Time 4    Period Weeks    Status New    Target Date 01/05/21      PT SHORT TERM GOAL #2   Title 6MWT to be assessed and LTG to be set    Baseline TBA    Time 4    Period Weeks    Status New              Long Term Goals:    PT Long Term Goals - 12/08/20 1635       PT LONG TERM GOAL #1   Title Pt and pt's spouse will be independent with final HEP in order to build upon functional gains made in therapy. (ALL LTGS DUE 02/02/21)    Baseline pt reports independence with HEP, will continue to benefit from progressive HEP    Time 8    Period Weeks    Status On-going    Target Date 02/02/21      PT LONG TERM GOAL #2   Title Pt will ambulate small distances of 20-30' with no AD, Mod I while holding a plate in order for pt to ambulate from his kitchen to the dining room safely.    Baseline 130' carrying plate intermittent supervision/CGA    Time 8    Period Weeks    Status Revised      PT LONG TERM GOAL #3   Title Pt will perform TUG in 19 seconds or less with LRAD in order to demo decr fall risk.    Baseline 23.91 seconds; 22.79 seconds with hurry cane; 21.32 secs w/ hurrycane    Time 8    Period Weeks    Status On-going      PT LONG TERM GOAL #4   Title LTG to be set for 6MWT    Baseline TBA    Time 8    Period Weeks    Status New      PT LONG TERM GOAL #5   Title Pt will improve gait speed to at least 1.8 ft/sec with hurry cane in order to demo decr fall risk.    Baseline 1.35 ft/sec; 1.65 ft/sec    Time 8    Period Weeks    Status Revised                Plan - 12/08/20 1555     Clinical Impression Statement Today's skilled PT session included assesment of patient's progress toward LTG. Patient able to meet LTG #2, 4 and 5. Paitent demonstrating progress toward  all other goals. Patient demonstrating improved ability to ambulate without AD and on outdoor surfaces. Patient continue to demo fall risk with TUG time of 21.32 seconds, and is currently ambulating at 1.65 ft/sec with SPC. Patient continues to demo steady progress with PT services and will benefit from contineud skilled PT services.    Personal Factors and Comorbidities Comorbidity 1;Past/Current Experience;Profession    Comorbidities HTN    Examination-Activity Limitations Stand;Stairs;Squat;Transfers;Locomotion Level    Examination-Participation Restrictions  Community Activity;Occupation   walking his dog   Stability/Clinical Decision Making Stable/Uncomplicated    Rehab Potential Good    PT Frequency 1x / week    PT Duration 8 weeks    PT Treatment/Interventions ADLs/Self Care Home Management;Aquatic Therapy;Electrical Stimulation;DME Instruction;Gait training;Stair training;Functional mobility training;Therapeutic activities;Therapeutic exercise;Balance training;Neuromuscular re-education;Wheelchair mobility training;Orthotic Fit/Training;Patient/family education;Passive range of motion;Energy conservation;Vestibular    PT Next Visit Plan Continue SciFit. Assess 6MWT. tall kneel and half kneeling, balance strategies on different surfaces/decr UE support and working on transitional movements, NMR/strengthening for LLE. SLS on LLE.    PT Home Exercise Plan [id number]    Consulted and Agree with Plan of Care Patient             Patient will benefit from skilled therapeutic intervention in order to improve the following deficits and impairments:  Abnormal gait, Decreased activity tolerance, Decreased balance, Decreased mobility, Decreased knowledge of use of DME, Decreased coordination, Decreased range of motion, Decreased strength, Difficulty walking, Impaired tone, Impaired sensation, Postural dysfunction  Visit Diagnosis: Muscle weakness (generalized)  Unsteadiness on feet  Other  abnormalities of gait and mobility     Problem List Patient Active Problem List   Diagnosis Date Noted   Elevated glucose 10/13/2020   Hyperlipidemia associated with type 2 diabetes mellitus ([place]) 10/05/2020   History of CVA (cerebrovascular accident) 01/19/2020   Normocytic anemia    Benign essential HTN    Hyponatremia    Acute blood loss anemia    Hypotension due to drugs    Vascular headache    Prerenal azotemia    Sleep disturbance    Leukocytosis    Transaminitis    Marijuana abuse    Essential hypertension    Dysphagia, post-stroke    Left hemiparesis (HCC)    Acute respiratory failure (HCC)    ICH (intracerebral hemorrhage) ([place]) 12/06/2019    Jones Bales, PT, DPT 12/08/2020, 4:32 PM  [facility] [address] [place], [place], [address] Phone: [phone]   Fax:  [phone]  Name: Dennis Mercado MRN: [id number] Date of Birth: 26-Dec-1980

## 2020-12-15 ENCOUNTER — Other Ambulatory Visit: Payer: Self-pay

## 2020-12-15 ENCOUNTER — Ambulatory Visit: Payer: Managed Care, Other (non HMO)

## 2020-12-15 DIAGNOSIS — R208 Other disturbances of skin sensation: Secondary | ICD-10-CM | POA: Insufficient documentation

## 2020-12-15 DIAGNOSIS — R29818 Other symptoms and signs involving the nervous system: Secondary | ICD-10-CM | POA: Insufficient documentation

## 2020-12-15 DIAGNOSIS — M6281 Muscle weakness (generalized): Secondary | ICD-10-CM | POA: Insufficient documentation

## 2020-12-15 DIAGNOSIS — R2689 Other abnormalities of gait and mobility: Secondary | ICD-10-CM

## 2020-12-15 DIAGNOSIS — R2681 Unsteadiness on feet: Secondary | ICD-10-CM

## 2020-12-15 DIAGNOSIS — I69354 Hemiplegia and hemiparesis following cerebral infarction affecting left non-dominant side: Secondary | ICD-10-CM | POA: Insufficient documentation

## 2020-12-15 DIAGNOSIS — G8114 Spastic hemiplegia affecting left nondominant side: Secondary | ICD-10-CM | POA: Diagnosis present

## 2020-12-15 NOTE — Therapy (Signed)
[facility] [address] [place], [place], [address] Phone: [phone]   Fax:  [phone]  Physical Therapy Treatment  Patient Details  Name: Dennis Mercado MRN: [id number] Date of Birth: 1980-11-07 Referring Provider (PT): Letta Pate [name], MD   Encounter Date: 12/15/2020   PT End of Session - 12/15/20 1305     Visit Number 16    Number of Visits 22    Date for PT Re-Evaluation 02/02/21    Authorization Type VL: 42 visits PT, 30 OT    Authorization - Visit Number 15    Authorization - Number of Visits 60    PT Start Time 2119    PT Stop Time 1350    PT Time Calculation (min) 45 min    Equipment Utilized During Treatment Gait belt    Activity Tolerance Patient tolerated treatment well    Behavior During Therapy [facility] for tasks assessed/performed             No past medical history on file.  Past Surgical History:  Procedure Laterality Date   WISDOM TOOTH EXTRACTION      There were no vitals filed for this visit.   Subjective Assessment - 12/15/20 1308     Subjective Passed his test! Has another one soon. No new changes/complaints. No pain.    Pertinent History HTN    Limitations Walking    Patient Stated Goals wants to work on not walking with the cane    Currently in Pain? No/denies                [facility] PT Assessment - 12/15/20 0001       6 Minute Walk- Baseline   6 Minute Walk- Baseline yes    BP (mmHg) 122/87    HR (bpm) 90    Modified Borg Scale for Dyspnea 0- Nothing at all      6 Minute walk- Post Test   6 Minute Walk Post Test yes    BP (mmHg) 129/91    HR (bpm) 99    Modified Borg Scale for Dyspnea 0- Nothing at all    Perceived Rate of Exertion (Borg) 6-      6 minute walk test results    Aerobic Endurance Distance Walked 479    Endurance additional comments with use of hurrycane, close supervision                [facility] Adult PT Treatment/Exercise - 12/15/20  0001       Ambulation/Gait   Ambulation/Gait Yes    Ambulation/Gait Assistance 5: Supervision    Ambulation/Gait Assistance Details use of hurrycane with 6MWT; rest of session ambulating without AD    Ambulation Distance (Feet) --   see assesment tab for 6MWT; clinic distance throughout session   Assistive device Straight cane;None    Gait Pattern Step-through pattern;Decreased step length - left;Decreased stance time - left;Decreased hip/knee flexion - left;Decreased arm swing - left    Ambulation Surface Level;Indoor      Neuro Re-ed    Neuro Re-ed Details  In tall kneeling on mat, completed standing tall maintaining hip extension and alignment without UE support, PT providing manual perturbations x 10 in various directions with eyes open, and then with eyes closed x 10 reps working on maintain balance and activation of core for stability. Then with support from bench using RUE, completed hip flexion/extension with RLE to promote weight shift onto LLE x 15 reps. PT providing tactile cues  and manual faciliation for improved weight shift laterally Increased challenge with hip extension > flexion. Completed mini squats without UE support working on equal weight shift, intermittent cues required, completed x 15 reps.                 Balance Exercises - 12/15/20 0001       Balance Exercises: Standing   Rockerboard Anterior/posterior    Rockerboard Limitations completed standing in front of rockerboard, completed step up onto board positioned A/P with alternating LE and opposite stance leg tapping to cone. increased challenge with SLS on LLE requiring RUE support, cues to stand tall maintain hip extension to promote improved stance. Completed x 15 reps bilaterally. CGA               PT Education - 12/15/20 1308     Education Details 6MWT Results    Person(s) Educated Patient    Methods Explanation    Comprehension Verbalized understanding              PT Short Term  Goals - 12/15/20 1356       PT SHORT TERM GOAL #1   Title Pt will report return to gym at least 2x/week for participation on stationary stepper for improved endurance/strength (All STGS: 01/05/21)    Baseline has not went at this time.    Time 4    Period Weeks    Status New    Target Date 01/05/21      PT SHORT TERM GOAL #2   Title 6MWT to be assessed and LTG to be set    Baseline assessed and LTG set    Time 4    Period Weeks    Status Achieved               PT Long Term Goals - 12/15/20 1357       PT LONG TERM GOAL #1   Title Pt and pt's spouse will be independent with final HEP in order to build upon functional gains made in therapy. (ALL LTGS DUE 02/02/21)    Baseline pt reports independence with HEP, will continue to benefit from progressive HEP    Time 8    Period Weeks    Status On-going      PT LONG TERM GOAL #2   Title Pt will ambulate small distances of 20-30' with no AD, Mod I while holding a plate in order for pt to ambulate from his kitchen to the dining room safely.    Baseline 130' carrying plate intermittent supervision/CGA    Time 8    Period Weeks    Status Revised      PT LONG TERM GOAL #3   Title Pt will perform TUG in 19 seconds or less with LRAD in order to demo decr fall risk.    Baseline 23.91 seconds; 22.79 seconds with hurry cane; 21.32 secs w/ hurrycane    Time 8    Period Weeks    Status On-going      PT LONG TERM GOAL #4   Title Patient will be able to ambulate >/= 600 ft with completion of 6MWT with LRAD vs. no AD to demo improved endurance    Baseline 479 ft with SPC    Time 8    Period Weeks    Status Revised      PT LONG TERM GOAL #5   Title Pt will improve gait speed to at least 1.8 ft/sec with hurry cane in order  to demo decr fall risk.    Baseline 1.35 ft/sec; 1.65 ft/sec    Time 8    Period Weeks    Status Revised                   Plan - 12/15/20 1312     Clinical Impression Statement Completed assesment  of 6MWT test for endurance, patient able to ambulate 479 ft with SPC. Vitals stable with activity. Rest of session focused on NMR/balance training to promote improved SLS and weight shift/stance on LLE. Will continue to progress toward all LTGs.    Personal Factors and Comorbidities Comorbidity 1;Past/Current Experience;Profession    Comorbidities HTN    Examination-Activity Limitations Stand;Stairs;Squat;Transfers;Locomotion Level    Examination-Participation Restrictions Community Activity;Occupation   walking his dog   Stability/Clinical Decision Making Stable/Uncomplicated    Rehab Potential Good    PT Frequency 1x / week    PT Duration 8 weeks    PT Treatment/Interventions ADLs/Self Care Home Management;Aquatic Therapy;Electrical Stimulation;DME Instruction;Gait training;Stair training;Functional mobility training;Therapeutic activities;Therapeutic exercise;Balance training;Neuromuscular re-education;Wheelchair mobility training;Orthotic Fit/Training;Patient/family education;Passive range of motion;Energy conservation;Vestibular    PT Next Visit Plan Continue SciFit. tall kneel and half kneeling, balance strategies on different surfaces/decr UE support and working on transitional movements, NMR/strengthening for LLE. SLS on LLE.    PT Home Exercise Plan [id number]    Consulted and Agree with Plan of Care Patient             Patient will benefit from skilled therapeutic intervention in order to improve the following deficits and impairments:  Abnormal gait, Decreased activity tolerance, Decreased balance, Decreased mobility, Decreased knowledge of use of DME, Decreased coordination, Decreased range of motion, Decreased strength, Difficulty walking, Impaired tone, Impaired sensation, Postural dysfunction  Visit Diagnosis: Muscle weakness (generalized)  Unsteadiness on feet  Other abnormalities of gait and mobility     Problem List Patient Active Problem List   Diagnosis Date Noted    Elevated glucose 10/13/2020   Hyperlipidemia associated with type 2 diabetes mellitus ([place]) 10/05/2020   History of CVA (cerebrovascular accident) 01/19/2020   Normocytic anemia    Benign essential HTN    Hyponatremia    Acute blood loss anemia    Hypotension due to drugs    Vascular headache    Prerenal azotemia    Sleep disturbance    Leukocytosis    Transaminitis    Marijuana abuse    Essential hypertension    Dysphagia, post-stroke    Left hemiparesis ([place])    Acute respiratory failure (HCC)    ICH (intracerebral hemorrhage) ([place]) 12/06/2019    Jones Bales, PT, DPT 12/15/2020, 1:59 PM  [facility] [address] [place], [place], [address] Phone: [phone]   Fax:  [phone]  Name: Dennis Mercado MRN: [id number] Date of Birth: 1981-05-21

## 2020-12-19 ENCOUNTER — Other Ambulatory Visit: Payer: Self-pay

## 2020-12-19 ENCOUNTER — Ambulatory Visit: Payer: Managed Care, Other (non HMO) | Admitting: Physical Therapy

## 2020-12-19 ENCOUNTER — Ambulatory Visit: Payer: Managed Care, Other (non HMO) | Admitting: Occupational Therapy

## 2020-12-19 ENCOUNTER — Encounter: Payer: Self-pay | Admitting: Physical Therapy

## 2020-12-19 DIAGNOSIS — G8114 Spastic hemiplegia affecting left nondominant side: Secondary | ICD-10-CM | POA: Diagnosis not present

## 2020-12-19 DIAGNOSIS — M6281 Muscle weakness (generalized): Secondary | ICD-10-CM

## 2020-12-19 DIAGNOSIS — I69354 Hemiplegia and hemiparesis following cerebral infarction affecting left non-dominant side: Secondary | ICD-10-CM

## 2020-12-19 DIAGNOSIS — R2689 Other abnormalities of gait and mobility: Secondary | ICD-10-CM

## 2020-12-19 DIAGNOSIS — R2681 Unsteadiness on feet: Secondary | ICD-10-CM

## 2020-12-19 NOTE — Therapy (Addendum)
[facility] [address] [place], [place], [address] Phone: [phone]   Fax:  [phone]  Physical Therapy Treatment  Patient Details  Name: Dennis Mercado MRN: [id number] Date of Birth: [birth year] Referring Provider (PT): Letta Pate [name], MD   Encounter Date: 12/19/2020   PT End of Session - 12/19/20 1822     Visit Number 17    Number of Visits 22    Date for PT Re-Evaluation 02/02/21    Authorization Type VL: 60 visits PT, 30 OT    Authorization - Visit Number 16    Authorization - Number of Visits 60    PT Start Time 1700    PT Stop Time 7341    PT Time Calculation (min) 43 min    Equipment Utilized During Treatment Gait belt    Activity Tolerance Patient tolerated treatment well    Behavior During Therapy [facility] for tasks assessed/performed             History reviewed. No pertinent past medical history.  Past Surgical History:  Procedure Laterality Date   WISDOM TOOTH EXTRACTION      There were no vitals filed for this visit.   Subjective Assessment - 12/19/20 1703     Subjective Passed his test today! No other changes.    Pertinent History HTN    Limitations Walking    Patient Stated Goals wants to work on not walking with the cane    Currently in Pain? No/denies                               [facility] Adult PT Treatment/Exercise - 12/19/20 1721       Ambulation/Gait   Ambulation/Gait Yes    Ambulation/Gait Assistance 5: Supervision    Ambulation/Gait Assistance Details after BWS - cues to incr stance time with LLE    Assistive device --   hurrycane   Gait Pattern Step-through pattern;Decreased step length - left;Decreased stance time - left;Decreased hip/knee flexion - left;Decreased arm swing - left    Ambulation Surface Level;Indoor    Gait Comments with BWS over treadmill: at .8 mph with RUE support, approx 18# unweighted, cues for incr stance time with LLE and for  incr step length with RLE, therapist helping to assist with foot clearance with LLE. BP after treadmill: 145/82, HR: 102 bpm                 Balance Exercises - 12/19/20 1739       Balance Exercises: Standing   Stepping Strategy Posterior;Anterior;Limitations;Foam/compliant surface    Stepping Strategy Limitations standing on blue mat - weight shifting to LLE x10 reps forward steps with RLE, x10 reps posterior steps with RLE, incr difficulty stepping backwards posteriorly    Sidestepping 3 reps;Foam/compliant support;Limitations    Sidestepping Limitations down and back 3 reps on blue mat in // bars beginning with UE support and then none    Step Over Hurdles / Cones stepping over 4 obstacles of 2" in height in // bars, beginning with step to pattern leading with LLE for incr hip/knee flexion down and back x2 reps, then performed with reciprocal pattern down and back 3 reps with therapist assisting with incr hip/knee flexion to clear with LLE    Other Standing Exercises with LLE on 6" step, modified lunges x10 reps, then performed with RLE on step x10 reps -verbal and demo cues for proper technique  PT Short Term Goals - 12/15/20 1356       PT SHORT TERM GOAL #1   Title Pt will report return to gym at least 2x/week for participation on stationary stepper for improved endurance/strength (All STGS: 01/05/21)    Baseline has not went at this time.    Time 4    Period Weeks    Status New    Target Date 01/05/21      PT SHORT TERM GOAL #2   Title 6MWT to be assessed and LTG to be set    Baseline assessed and LTG set    Time 4    Period Weeks    Status Achieved               PT Long Term Goals - 12/15/20 1357       PT LONG TERM GOAL #1   Title Pt and pt's spouse will be independent with final HEP in order to build upon functional gains made in therapy. (ALL LTGS DUE 02/02/21)    Baseline pt reports independence with HEP, will continue to benefit  from progressive HEP    Time 8    Period Weeks    Status On-going      PT LONG TERM GOAL #2   Title Pt will ambulate small distances of 20-30' with no AD, Mod I while holding a plate in order for pt to ambulate from his kitchen to the dining room safely.    Baseline 130' carrying plate intermittent supervision/CGA    Time 8    Period Weeks    Status Revised      PT LONG TERM GOAL #3   Title Pt will perform TUG in 19 seconds or less with LRAD in order to demo decr fall risk.    Baseline 23.91 seconds; 22.79 seconds with hurry cane; 21.32 secs w/ hurrycane    Time 8    Period Weeks    Status On-going      PT LONG TERM GOAL #4   Title Patient will be able to ambulate >/= 600 ft with completion of 6MWT with LRAD vs. no AD to demo improved endurance    Baseline 479 ft with SPC    Time 8    Period Weeks    Status Revised      PT LONG TERM GOAL #5   Title Pt will improve gait speed to at least 1.8 ft/sec with hurry cane in order to demo decr fall risk.    Baseline 1.35 ft/sec; 1.65 ft/sec    Time 8    Period Weeks    Status Revised                   Plan - 12/19/20 1829     Clinical Impression Statement Performed BWS treadmill training for the first time today at .8 mph with focus on incr stance time on LLE for incr hip extension during terminal stance and incr step length with RLE, pt tolerated well with vitals WFL afterwards. Therapist also helping to provide assist for incr LLE foor clearance. Will continue to progress towards LTGs.    Personal Factors and Comorbidities Comorbidity 1;Past/Current Experience;Profession    Comorbidities HTN    Examination-Activity Limitations Stand;Stairs;Squat;Transfers;Locomotion Level    Examination-Participation Restrictions Community Activity;Occupation   walking his dog   Stability/Clinical Decision Making Stable/Uncomplicated    Rehab Potential Good    PT Frequency 1x / week    PT Duration 8 weeks  PT Treatment/Interventions  ADLs/Self Care Home Management;Aquatic Therapy;Electrical Stimulation;DME Instruction;Gait training;Stair training;Functional mobility training;Therapeutic activities;Therapeutic exercise;Balance training;Neuromuscular re-education;Wheelchair mobility training;Orthotic Fit/Training;Patient/family education;Passive range of motion;Energy conservation;Vestibular    PT Next Visit Plan Continue SciFit. tall kneel and half kneeling, balance strategies on different surfaces/decr UE support and working on transitional movements, NMR/strengthening for LLE. SLS on LLE. continue treadmill training.    PT Home Exercise Plan [id number]    Consulted and Agree with Plan of Care Patient             Patient will benefit from skilled therapeutic intervention in order to improve the following deficits and impairments:  Abnormal gait, Decreased activity tolerance, Decreased balance, Decreased mobility, Decreased knowledge of use of DME, Decreased coordination, Decreased range of motion, Decreased strength, Difficulty walking, Impaired tone, Impaired sensation, Postural dysfunction  Visit Diagnosis: Muscle weakness (generalized)  Unsteadiness on feet  Other abnormalities of gait and mobility  Hemiplegia and hemiparesis following cerebral infarction affecting left non-dominant side [facility])     Problem List Patient Active Problem List   Diagnosis Date Noted   Elevated glucose 10/13/2020   Hyperlipidemia associated with type 2 diabetes mellitus ([place]) 10/05/2020   History of CVA (cerebrovascular accident) 01/19/2020   Normocytic anemia    Benign essential HTN    Hyponatremia    Acute blood loss anemia    Hypotension due to drugs    Vascular headache    Prerenal azotemia    Sleep disturbance    Leukocytosis    Transaminitis    Marijuana abuse    Essential hypertension    Dysphagia, post-stroke    Left hemiparesis (HCC)    Acute respiratory failure (HCC)    ICH (intracerebral hemorrhage) ([place])  12/06/2019    Arliss Journey, PT, DPT  12/19/2020, 6:31 PM  [facility] [address] [place], [place], [address] Phone: [phone]   Fax:  [phone]  Name: Donta Fuster MRN: [id number] Date of Birth: 02/05/1981

## 2020-12-25 ENCOUNTER — Encounter: Payer: Managed Care, Other (non HMO) | Admitting: Occupational Therapy

## 2020-12-26 ENCOUNTER — Telehealth: Payer: Self-pay

## 2020-12-26 NOTE — Telephone Encounter (Signed)
[name] Disability Placard will expiires in  August.2022. Is it possible for him to get a new one? Or is he eligible for a permanent one?   Please advise.

## 2020-12-27 NOTE — Telephone Encounter (Signed)
Patient informed. 

## 2020-12-28 ENCOUNTER — Encounter: Payer: Self-pay | Admitting: Physical Medicine & Rehabilitation

## 2020-12-28 ENCOUNTER — Encounter: Payer: Self-pay | Admitting: Occupational Therapy

## 2020-12-28 ENCOUNTER — Ambulatory Visit: Payer: Managed Care, Other (non HMO) | Admitting: Physical Therapy

## 2020-12-28 ENCOUNTER — Encounter: Payer: Self-pay | Admitting: Physical Therapy

## 2020-12-28 ENCOUNTER — Other Ambulatory Visit: Payer: Self-pay

## 2020-12-28 ENCOUNTER — Ambulatory Visit: Payer: Managed Care, Other (non HMO) | Admitting: Occupational Therapy

## 2020-12-28 ENCOUNTER — Encounter
Payer: Managed Care, Other (non HMO) | Attending: Physical Medicine & Rehabilitation | Admitting: Physical Medicine & Rehabilitation

## 2020-12-28 VITALS — BP 126/92 | Ht 67.0 in | Wt 151.0 lb

## 2020-12-28 DIAGNOSIS — I69354 Hemiplegia and hemiparesis following cerebral infarction affecting left non-dominant side: Secondary | ICD-10-CM

## 2020-12-28 DIAGNOSIS — R29818 Other symptoms and signs involving the nervous system: Secondary | ICD-10-CM

## 2020-12-28 DIAGNOSIS — R208 Other disturbances of skin sensation: Secondary | ICD-10-CM

## 2020-12-28 DIAGNOSIS — G8114 Spastic hemiplegia affecting left nondominant side: Secondary | ICD-10-CM | POA: Insufficient documentation

## 2020-12-28 DIAGNOSIS — R2681 Unsteadiness on feet: Secondary | ICD-10-CM

## 2020-12-28 DIAGNOSIS — R2689 Other abnormalities of gait and mobility: Secondary | ICD-10-CM

## 2020-12-28 DIAGNOSIS — M6281 Muscle weakness (generalized): Secondary | ICD-10-CM

## 2020-12-28 NOTE — Therapy (Addendum)
[facility] [address] [place], [place], [address] Phone: [phone]   Fax:  [phone]  Physical Therapy Treatment  Patient Details  Name: Dennis Mercado MRN: [id number] Date of Birth: 31-May-1981 Referring Provider (PT): Letta Pate [name], MD   Encounter Date: 12/28/2020   PT End of Session - 12/28/20 1619     Visit Number 18    Number of Visits 22    Date for PT Re-Evaluation 02/02/21    Authorization Type VL: 25 visits PT, 30 OT    Authorization - Visit Number 17    Authorization - Number of Visits 60    PT Start Time 6283    PT Stop Time 1612    PT Time Calculation (min) 41 min    Equipment Utilized During Treatment Gait belt    Activity Tolerance Patient tolerated treatment well    Behavior During Therapy [facility] for tasks assessed/performed             History reviewed. No pertinent past medical history.  Past Surgical History:  Procedure Laterality Date   WISDOM TOOTH EXTRACTION      There were no vitals filed for this visit.   Subjective Assessment - 12/28/20 1535     Subjective Saw Dr. Letta Pate, thinks he will be getting more botox in the L arm. Going to be going to the beach in the 2nd week of August.    Pertinent History HTN    Limitations Walking    Patient Stated Goals wants to work on not walking with the cane    Currently in Pain? No/denies                               [facility] Adult PT Treatment/Exercise - 12/28/20 1553       Neuro Re-ed    Neuro Re-ed Details  NMR: tall kneeling mini squats with use of black tband resistance and cues for full hip extension x10 reps performed without UE support, then with L sided bias for an additional x10 reps. PT providing random perturbations with tband at pt's pelvis and therapist manual perturbations at hips and trunk in different directions with pt in tall kneel position with eyes open then eyes closed and repeated same  activity with pt on blue air ex with eyes open and then eyes closed with no UE support, pt tolerated well -challenged most with EC. in half kneeling with LLE posteriorly, holding balance 3 x 10 -15 seconds with cues for glute activation, x5 reps head turns, 2 x 5 reps head turns - with min A with incr difficulty with performing head turns. Pt fatigues easily with half kneeling.                 Balance Exercises - 12/28/20 1559       Balance Exercises: Standing   SLS Eyes open;Foam/compliant surface    SLS Limitations on blue air ex; stance time on LLE and tapping RLE to colorful floor bubble x15 reps - beginning with UE support and then to none with min guard, cues for glute/quad activation on LLE during stance, then with standing on RLE - lifting LLE to 4" step and tapping forward box and then cross body tapping x5 reps - no UE support   Sit to Stand [place] surface;Limitations;Foam/compliant surface    Sit to Stand Limitations on blue air ex x7 reps without UE support, manaul/verbal cues for incr  weight shift to L before slowly sitting back down.                 PT Short Term Goals - 12/15/20 1356       PT SHORT TERM GOAL #1   Title Pt will report return to gym at least 2x/week for participation on stationary stepper for improved endurance/strength (All STGS: 01/05/21)    Baseline has not went at this time.    Time 4    Period Weeks    Status New    Target Date 01/05/21      PT SHORT TERM GOAL #2   Title 6MWT to be assessed and LTG to be set    Baseline assessed and LTG set    Time 4    Period Weeks    Status Achieved               PT Long Term Goals - 12/15/20 1357       PT LONG TERM GOAL #1   Title Pt and pt's spouse will be independent with final HEP in order to build upon functional gains made in therapy. (ALL LTGS DUE 02/02/21)    Baseline pt reports independence with HEP, will continue to benefit from progressive HEP    Time 8    Period Weeks     Status On-going      PT LONG TERM GOAL #2   Title Pt will ambulate small distances of 20-30' with no AD, Mod I while holding a plate in order for pt to ambulate from his kitchen to the dining room safely.    Baseline 130' carrying plate intermittent supervision/CGA    Time 8    Period Weeks    Status Revised      PT LONG TERM GOAL #3   Title Pt will perform TUG in 19 seconds or less with LRAD in order to demo decr fall risk.    Baseline 23.91 seconds; 22.79 seconds with hurry cane; 21.32 secs w/ hurrycane    Time 8    Period Weeks    Status On-going      PT LONG TERM GOAL #4   Title Patient will be able to ambulate >/= 600 ft with completion of 6MWT with LRAD vs. no AD to demo improved endurance    Baseline 479 ft with SPC    Time 8    Period Weeks    Status Revised      PT LONG TERM GOAL #5   Title Pt will improve gait speed to at least 1.8 ft/sec with hurry cane in order to demo decr fall risk.    Baseline 1.35 ft/sec; 1.65 ft/sec    Time 8    Period Weeks    Status Revised                   Plan - 12/28/20 1620     Clinical Impression Statement Today's skilled session focused on strengthening/core stability in half kneeling and tall kneeling, balance on compliant surfaces with decr UE support and SLS tasks on LLE. Pt challenged by half kneel activities with LLE posteriorly esp with head turns, needing min A and intermittent UE support for balance. Will continue to progress towards LTGs.    Personal Factors and Comorbidities Comorbidity 1;Past/Current Experience;Profession    Comorbidities HTN    Examination-Activity Limitations Stand;Stairs;Squat;Transfers;Locomotion Level    Examination-Participation Restrictions Community Activity;Occupation   walking his dog   Stability/Clinical Decision Making Stable/Uncomplicated  Rehab Potential Good    PT Frequency 1x / week    PT Duration 8 weeks    PT Treatment/Interventions ADLs/Self Care Home Management;Aquatic  Therapy;Electrical Stimulation;DME Instruction;Gait training;Stair training;Functional mobility training;Therapeutic activities;Therapeutic exercise;Balance training;Neuromuscular re-education;Wheelchair mobility training;Orthotic Fit/Training;Patient/family education;Passive range of motion;Energy conservation;Vestibular    PT Next Visit Plan begin with gait with no AD before pt gets too fatigued! Continue SciFit. tall kneel and half kneeling, balance strategies on different surfaces/decr UE support, NMR/strengthening for LLE and SLS. can continue treadmill training for BWS.    PT Home Exercise Plan [id number]    Consulted and Agree with Plan of Care Patient             Patient will benefit from skilled therapeutic intervention in order to improve the following deficits and impairments:  Abnormal gait, Decreased activity tolerance, Decreased balance, Decreased mobility, Decreased knowledge of use of DME, Decreased coordination, Decreased range of motion, Decreased strength, Difficulty walking, Impaired tone, Impaired sensation, Postural dysfunction  Visit Diagnosis: Muscle weakness (generalized)  Unsteadiness on feet  Other abnormalities of gait and mobility     Problem List Patient Active Problem List   Diagnosis Date Noted   Elevated glucose 10/13/2020   Hyperlipidemia associated with type 2 diabetes mellitus ([place]) 10/05/2020   History of CVA (cerebrovascular accident) 01/19/2020   Normocytic anemia    Benign essential HTN    Hyponatremia    Acute blood loss anemia    Hypotension due to drugs    Vascular headache    Prerenal azotemia    Sleep disturbance    Leukocytosis    Transaminitis    Marijuana abuse    Essential hypertension    Dysphagia, post-stroke    Left hemiparesis (HCC)    Acute respiratory failure (HCC)    ICH (intracerebral hemorrhage) ([place]) 12/06/2019    Arliss Journey, PT, DPT  12/28/2020, 4:22 PM  [facility] [address] [place], [place], [address] Phone: [phone]   Fax:  [phone]  Name: Dennis Mercado MRN: [id number] Date of Birth: 07-20-1980

## 2020-12-28 NOTE — Progress Notes (Signed)
Subjective:    Patient ID: Dennis Mercado, male    DOB: 02/12/1981, 40 y.o.    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 Going to school for life insurance, sitting for state exams   Pain Inventory Average Pain 0 Pain Right Now 0 My pain is  No pain here for Stroke  LOCATION OF PAIN  No pain stroke. Only left sided weakness for shoulder down to left foot.   BOWEL Number of stools per week: 7 Oral laxative use No  Type of laxative none Enema or suppository use No  History of colostomy No  Incontinent No   BLADDER Normal In and out cath, frequency n/a Able to self cath No  Bladder incontinence No  Frequent urination No  Leakage with coughing No  Difficulty starting stream No  Incomplete bladder emptying No    Mobility use a cane how many minutes can you walk? Maybe 10 minutes ability to climb steps?  yes do you drive?  yes Do you have any goals in this area?  yes  Function what is your job? Not working currently in school. I need assistance with the following:  shopping Do you have any goals in this area?  yes  Neuro/Psych weakness numbness trouble walking  Prior Studies Any changes since last visit?  no  Physicians involved in your care Any changes since last visit?  no   History reviewed. No pertinent family history. Social History   Socioeconomic History   Marital status: Married    Spouse name: Not on file   Number of children: Not on file   Years of education: Not on file   Highest education level: Not on file  Occupational History   Not on file  Tobacco Use    Smoking status: Never   Smokeless tobacco: Never  Vaping Use   Vaping Use: Never used  Substance and Sexual Activity   Alcohol use: Not Currently    Comment: several drinks over the weekend   Drug use: Never   Sexual activity: Not on file  Other Topics Concern   Not on file  Social History Narrative   Not on file   Social Determinants of Health   Financial Resource Strain: Not on file  Food Insecurity: Not on file  Transportation Needs: Not on file  Physical Activity: Not on file  Stress: Not on file  Social Connections: Not on file   Past Surgical History:  Procedure Laterality Date   WISDOM TOOTH EXTRACTION     History reviewed. No pertinent past medical history. BP (!) 126/92   Ht 5\' 7"  (1.702 m)   Wt 151 lb (68.5 kg)   SpO2 98%   BMI 23.65 kg/m   Opioid Risk Score:   Fall Risk Score:  `1  Depression screen PHQ 2/9  Depression screen [facility] 2/9 10/05/2020 10/05/2020 08/11/2020 05/18/2020 04/18/2020 01/25/2020  Decreased Interest 0 0 0 0 0 0  Down, Depressed, Hopeless 0 0 0 0 0 0  PHQ - 2 Score 0 0 0 0 0 0  Altered sleeping 1 - - - - 0  Tired, decreased energy  1 - - - - 0  Change in appetite 0 - - - - 0  Feeling bad or failure about yourself  0 - - - - 0  Trouble concentrating 0 - - - - 0  Moving slowly or fidgety/restless 0 - - - - 0  Suicidal thoughts 0 - - - - 0  PHQ-9 Score 2 - - - - 0  Difficult doing work/chores Not difficult at all - - - - -    Review of Systems     Objective:   Physical Exam Vitals and nursing note reviewed.  Constitutional:      Appearance: Normal appearance.  HENT:     Head: Normocephalic and atraumatic.  Eyes:     Extraocular Movements: Extraocular movements intact.     Conjunctiva/sclera: Conjunctivae normal.     Pupils: Pupils are equal, round, and reactive to light.  Neurological:     Mental Status: He is alert.  Psychiatric:        Mood and Affect: Mood normal.        Behavior: Behavior normal.   Motor strength is 3 -  at the left deltoid bicep tricep grip Tone left upper extremity MAS 3 at the elbow flexor 3 at the wrist and 3 at the finger flexors Left lower extremity 4/5 in the hip flexor knee extensor ankle dorsiflexor Ambulates with a cane no AFO he is able to clear his foot he has a stifflegged gait but otherwise no evidence of toe drag or knee instability.       Assessment & Plan:  1.  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  Would recommend repeating Dysport Biceps 500 units FDP 100 units FDS 100 units FCR 200 units FPL 100 units  Have encouraged him in terms of his change of careers.

## 2020-12-28 NOTE — Therapy (Signed)
[facility] [address] [place], [place], [address] Phone: [phone]   Fax:  [phone]  Occupational Therapy Treatment  Patient Details  Name: Dennis Mercado MRN: [id number] Date of Birth: 1981/02/28 Referring Provider (OT): Dr. Letta Pate   Encounter Date: 12/28/2020   OT End of Session - 12/28/20 1901     Visit Number 16    Number of Visits 28    Date for OT Re-Evaluation 02/18/21    Authorization Type recertify x 12 weeks    Authorization Time Period per patient 30 visit OT limit    Authorization - Visit Number 16    OT Start Time 7672    OT Stop Time 1700    OT Time Calculation (min) 45 min    Activity Tolerance Patient tolerated treatment well    Behavior During Therapy [facility] for tasks assessed/performed             History reviewed. No pertinent past medical history.  Past Surgical History:  Procedure Laterality Date   WISDOM TOOTH EXTRACTION      There were no vitals filed for this visit.   Subjective Assessment - 12/28/20 1659     Subjective  Patient reports seeing Dr Letta Pate yesterday and plans to get Botox again in August    Patient is accompanied by: Family member    Pertinent History Pt. presented to ED on 12/06/2019 with left side weakness and  altered mental status s/p coitus.  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 Pt required intubation 6/22 due to ARF secondary to aspiration PNA. Received inpatient rehab. Pt was discharged 7/28/2    Limitations HTN, left inattention, apraxia    Patient Stated Goals to be able to use my hand    Currently in Pain? No/denies    Pain Score 0-No pain                          OT Treatments/Exercises (OP) - 12/28/20 0001       Neurological Re-education Exercises   Other Exercises 1 Neuromuscular reeducation to address left UE active lengthening,  and support response.  Patient with improved ability to maintain active LUE in quadruped - able to maintain elbow extension without locking elbow.  Able to maintain hand on surface even when rotating toward or away from left side.  Patient reports he has been getting into this position and practicing at home.  Worked today to transition to side sit toward left side and back to 4 point.  This was very challenging.  Reduced the distance and worked in mid ranges to improve activation throught left trunk and limbs.  Patient report arm and leg much looser in his home environment, not around other people.  He will video this for next session.                      OT Short Term Goals - 11/20/20 1832       OT SHORT TERM GOAL #1   Title I with inital HEP    Time 4    Period Weeks    Status Achieved      OT SHORT TERM GOAL #2   Title Patient will demonstrate low reach to 30 degrees of shoulder flexion with min assist    Time 4    Period Weeks    Status Achieved  OT SHORT TERM GOAL #3   Title Patient will demonstrate 30 degrees of active assited wrist and forearm motion    Time 4    Period Weeks    Status Achieved      OT SHORT TERM GOAL #4   Title Patient will walk 20 feet and carry lightweight object using his left arm.    Time 6    Period Weeks    Status New    Target Date 01/04/21      OT SHORT TERM GOAL #5   Title Patient will transport himself to aquatic therapy without assistance    Time 6    Period Weeks    Status New    Target Date 01/04/21               OT Long Term Goals - 11/20/20 1834       OT LONG TERM GOAL #1   Title I with aquatic HEP    Time 8    Period Weeks    Status On-going      OT LONG TERM GOAL #2   Title Patient will demonstrate low reach to 45 degrees of shoulder flexion with elbow extension    Time 8    Period Weeks    Status Achieved      OT LONG TERM GOAL #3   Title Patient will demonstrate no pain with passive range of  motion to 120 shoulder flexion/abduction    Time 8    Period Weeks    Status Achieved      OT LONG TERM GOAL #4   Title Patient will incorporate self stretching techniques throughout the day whether at home or work.    Time 12    Period Weeks    Status New    Target Date 02/18/21      OT LONG TERM GOAL #5   Title Patient will reach to hit target at at least 70 degrees of shoulder flexion while upright    Time 12    Period Weeks    Status New      OT LONG TERM GOAL #6   Title xxx                   Plan - 12/28/20 1902     Clinical Impression Statement Patient showing improved ability to stabilize and activate through left limbs during transitional movements    OT Occupational Profile and History Detailed Assessment- Review of Records and additional review of physical, cognitive, psychosocial history related to current functional performance    Occupational performance deficits (Please refer to evaluation for details): ADL's;IADL's;Leisure;Social Participation;Work;Play    Body Structure / Function / Physical Skills ADL;Balance;Endurance;Mobility;Strength;UE functional use;FMC;Vision;Coordination;Gait;Decreased knowledge of precautions;GMC;ROM;Decreased knowledge of use of DME;Dexterity;IADL;Sensation;Tone;Body Banker;Safety Awareness;Thought;Understand    Rehab Potential Good    Clinical Decision Making Limited treatment options, no task modification necessary    Comorbidities Affecting Occupational Performance: May have comorbidities impacting occupational performance    Modification or Assistance to Complete Evaluation  No modification of tasks or assist necessary to complete eval    OT Frequency 1x / week    OT Duration 12 weeks    OT Treatment/Interventions Self-care/ADL training;Therapeutic exercise;Functional Mobility Training;Balance training;Splinting;Manual Therapy;Neuromuscular education;Aquatic  Therapy;Ultrasound;Energy conservation;Therapeutic activities;Cryotherapy;Paraffin;DME and/or AE instruction;Cognitive remediation/compensation;Visual/perceptual remediation/compensation;Gait Training;Fluidtherapy;Electrical Stimulation;Moist Heat;Contrast Bath;Passive range of motion;Patient/family education    Plan aquatic therapy and clinic therapy to address spasticity, need left trunk lengthening/right trunk shortening, realignment of  shoulder girdle, and isolated glenohumeral joint motion    Consulted and Agree with Plan of Care Patient             Patient will benefit from skilled therapeutic intervention in order to improve the following deficits and impairments:   Body Structure / Function / Physical Skills: ADL, Balance, Endurance, Mobility, Strength, UE functional use, FMC, Vision, Coordination, Gait, Decreased knowledge of precautions, GMC, ROM, Decreased knowledge of use of DME, Dexterity, IADL, Sensation, Tone, Body mechanics Cognitive Skills: Attention, Memory, Problem Solve, Safety Awareness, Thought, Understand     Visit Diagnosis: Hemiplegia and hemiparesis following cerebral infarction affecting left non-dominant side (HCC)  Other disturbances of skin sensation  Other symptoms and signs involving the nervous system  Unsteadiness on feet    Problem List Patient Active Problem List   Diagnosis Date Noted   Elevated glucose 10/13/2020   Hyperlipidemia associated with type 2 diabetes mellitus ([place]) 10/05/2020   History of CVA (cerebrovascular accident) 01/19/2020   Normocytic anemia    Benign essential HTN    Hyponatremia    Acute blood loss anemia    Hypotension due to drugs    Vascular headache    Prerenal azotemia    Sleep disturbance    Leukocytosis    Transaminitis    Marijuana abuse    Essential hypertension    Dysphagia, post-stroke    Left hemiparesis (HCC)    Acute respiratory failure (HCC)    ICH (intracerebral hemorrhage) ([place]) 12/06/2019     Mariah Milling 12/28/2020, 7:07 PM  [facility] [address] [place], [place], [address] Phone: [phone]   Fax:  [phone]  Name: Martha Soltys MRN: [id number] Date of Birth: 1981-03-23

## 2021-01-01 ENCOUNTER — Ambulatory Visit: Payer: Managed Care, Other (non HMO) | Admitting: Occupational Therapy

## 2021-01-01 ENCOUNTER — Encounter: Payer: Self-pay | Admitting: Occupational Therapy

## 2021-01-01 DIAGNOSIS — G8114 Spastic hemiplegia affecting left nondominant side: Secondary | ICD-10-CM | POA: Diagnosis not present

## 2021-01-01 NOTE — Therapy (Signed)
[facility] [address] [place], [place], [address] Phone: [phone]   Fax:  [phone]  Occupational Therapy Treatment  Patient Details  Name: Dennis Mercado MRN: [id number] Date of Birth: 10-Oct-1980 Referring Provider (OT): Dr. Letta Pate   Encounter Date: 01/01/2021   OT End of Session - 01/01/21 1623     Visit Number 17    Number of Visits 28    Date for OT Re-Evaluation 02/18/21    Authorization Type recertify x 12 weeks    Authorization Time Period per patient 30 visit OT limit    Authorization - Visit Number 17    Authorization - Number of Visits 17    OT Start Time 5361    OT Stop Time 1500    OT Time Calculation (min) 45 min    Activity Tolerance Patient tolerated treatment well    Behavior During Therapy [facility] for tasks assessed/performed             History reviewed. No pertinent past medical history.  Past Surgical History:  Procedure Laterality Date   WISDOM TOOTH EXTRACTION      There were no vitals filed for this visit.   Subjective Assessment - 01/01/21 1622     Subjective  I didn't do much this weekend.    Patient is accompanied by: Family member    Pertinent History Pt. presented to ED on 12/06/2019 with left side weakness and  altered mental status s/p coitus.  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 Pt required intubation 6/22 due to ARF secondary to aspiration PNA. Received inpatient rehab. Pt was discharged 7/28/2    Limitations HTN, left inattention, apraxia    Patient Stated Goals to be able to use my hand    Currently in Pain? No/denies             Patient seen for aquatic therapy visit.  Patient entered and exited pool via stairs  min to utilize UE assist.   Session occurred in 3.5-4.5 ft of water.   Intiially worked with walking to loosen up LE's and trunk.  Patient with  difficulty releasing left leg.  Also working to introduce varying degrees of turbulence as patient hopes to travel to the beach in August and wants to get into the ocean.   Worked on the wall to address shoulder range of motion and arm on body/ body on arm in increasing degrees of shoulder flexion and abduction.   Continued work to improve LLE control and strength.                         OT Short Term Goals - 01/01/21 1624       OT SHORT TERM GOAL #1   Title I with inital HEP    Time 4    Period Weeks    Status Achieved      OT SHORT TERM GOAL #2   Title Patient will demonstrate low reach to 30 degrees of shoulder flexion with min assist    Time 4    Period Weeks    Status Achieved      OT SHORT TERM GOAL #3   Title Patient will demonstrate 30 degrees of active assited wrist and forearm motion    Time 4    Period Weeks    Status Achieved      OT SHORT TERM GOAL #4  Title Patient will walk 20 feet and carry lightweight object using his left arm.    Time 6    Period Weeks    Status New    Target Date 01/04/21      OT SHORT TERM GOAL #5   Title Patient will transport himself to aquatic therapy without assistance    Time 6    Period Weeks    Status New    Target Date 01/04/21               OT Long Term Goals - 01/01/21 1624       OT LONG TERM GOAL #1   Title I with aquatic HEP    Time 8    Period Weeks    Status On-going      OT LONG TERM GOAL #2   Title Patient will demonstrate low reach to 45 degrees of shoulder flexion with elbow extension    Time 8    Period Weeks    Status Achieved      OT LONG TERM GOAL #3   Title Patient will demonstrate no pain with passive range of motion to 120 shoulder flexion/abduction    Time 8    Period Weeks    Status Achieved      OT LONG TERM GOAL #4   Title Patient will incorporate self stretching techniques throughout the day whether at home or work.    Time 12    Period Weeks    Status New       OT LONG TERM GOAL #5   Title Patient will reach to hit target at at least 70 degrees of shoulder flexion while upright    Time 12    Period Weeks    Status New      OT LONG TERM GOAL #6   Title xxx                   Plan - 01/01/21 1623     Clinical Impression Statement Patient has ot been in the pool in a few weeks, and was initially unstable - but quickly reacclimated    OT Occupational Profile and History Detailed Assessment- Review of Records and additional review of physical, cognitive, psychosocial history related to current functional performance    Occupational performance deficits (Please refer to evaluation for details): ADL's;IADL's;Leisure;Social Participation;Work;Play    Body Structure / Function / Physical Skills ADL;Balance;Endurance;Mobility;Strength;UE functional use;FMC;Vision;Coordination;Gait;Decreased knowledge of precautions;GMC;ROM;Decreased knowledge of use of DME;Dexterity;IADL;Sensation;Tone;Body Banker;Safety Awareness;Thought;Understand    Rehab Potential Good    Clinical Decision Making Limited treatment options, no task modification necessary    Comorbidities Affecting Occupational Performance: May have comorbidities impacting occupational performance    Modification or Assistance to Complete Evaluation  No modification of tasks or assist necessary to complete eval    OT Frequency 1x / week    OT Duration 12 weeks    OT Treatment/Interventions Self-care/ADL training;Therapeutic exercise;Functional Mobility Training;Balance training;Splinting;Manual Therapy;Neuromuscular education;Aquatic Therapy;Ultrasound;Energy conservation;Therapeutic activities;Cryotherapy;Paraffin;DME and/or AE instruction;Cognitive remediation/compensation;Visual/perceptual remediation/compensation;Gait Training;Fluidtherapy;Electrical Stimulation;Moist Heat;Contrast Bath;Passive range of motion;Patient/family education     Plan aquatic therapy and clinic therapy to address spasticity, need left trunk lengthening/right trunk shortening, realignment of shoulder girdle, and isolated glenohumeral joint motion    Consulted and Agree with Plan of Care Patient    Family Member Consulted wife April             Patient will benefit from skilled therapeutic intervention in order to  improve the following deficits and impairments:   Body Structure / Function / Physical Skills: ADL, Balance, Endurance, Mobility, Strength, UE functional use, FMC, Vision, Coordination, Gait, Decreased knowledge of precautions, GMC, ROM, Decreased knowledge of use of DME, Dexterity, IADL, Sensation, Tone, Body mechanics Cognitive Skills: Attention, Memory, Problem Solve, Safety Awareness, Thought, Understand     Visit Diagnosis: Hemiplegia and hemiparesis following cerebral infarction affecting left non-dominant side (HCC)  Other disturbances of skin sensation  Other symptoms and signs involving the nervous system  Unsteadiness on feet  Muscle weakness (generalized)    Problem List Patient Active Problem List   Diagnosis Date Noted   Elevated glucose 10/13/2020   Hyperlipidemia associated with type 2 diabetes mellitus ([place]) 10/05/2020   History of CVA (cerebrovascular accident) 01/19/2020   Normocytic anemia    Benign essential HTN    Hyponatremia    Acute blood loss anemia    Hypotension due to drugs    Vascular headache    Prerenal azotemia    Sleep disturbance    Leukocytosis    Transaminitis    Marijuana abuse    Essential hypertension    Dysphagia, post-stroke    Left hemiparesis (HCC)    Acute respiratory failure (HCC)    ICH (intracerebral hemorrhage) ([place]) 12/06/2019    Dennis Mercado 01/01/2021, 4:25 PM  [facility] [address] [place], [place], [address] Phone: [phone]   Fax:  [phone]  Name: Dennis Mercado MRN:  [id number] Date of Birth: 22-Feb-1981

## 2021-01-02 ENCOUNTER — Ambulatory Visit: Payer: Managed Care, Other (non HMO)

## 2021-01-04 ENCOUNTER — Ambulatory Visit: Payer: Managed Care, Other (non HMO) | Admitting: Physical Therapy

## 2021-01-05 ENCOUNTER — Ambulatory Visit: Payer: Managed Care, Other (non HMO) | Admitting: Family Medicine

## 2021-01-05 ENCOUNTER — Ambulatory Visit: Payer: Managed Care, Other (non HMO) | Admitting: Physical Therapy

## 2021-01-08 ENCOUNTER — Ambulatory Visit: Payer: Managed Care, Other (non HMO) | Admitting: Occupational Therapy

## 2021-01-11 ENCOUNTER — Other Ambulatory Visit: Payer: Self-pay

## 2021-01-11 ENCOUNTER — Ambulatory Visit: Payer: Managed Care, Other (non HMO)

## 2021-01-11 DIAGNOSIS — R2689 Other abnormalities of gait and mobility: Secondary | ICD-10-CM

## 2021-01-11 DIAGNOSIS — G8114 Spastic hemiplegia affecting left nondominant side: Secondary | ICD-10-CM | POA: Diagnosis not present

## 2021-01-11 DIAGNOSIS — M6281 Muscle weakness (generalized): Secondary | ICD-10-CM

## 2021-01-11 DIAGNOSIS — R2681 Unsteadiness on feet: Secondary | ICD-10-CM

## 2021-01-11 NOTE — Therapy (Signed)
[facility] [address] [place], [place], [address] Phone: [phone]   Fax:  [phone]  Physical Therapy Treatment  Patient Details  Name: Dennis Mercado MRN: [id number] Date of Birth: January 18, 1981 Referring Provider (PT): [name], MD   Encounter Date: 01/11/2021   PT End of Session - 01/11/21 1604     Visit Number 19    Number of Visits 22    Date for PT Re-Evaluation 02/02/21    Authorization Type VL: 93 visits PT, 30 OT    Authorization - Visit Number 18    Authorization - Number of Visits 60    PT Start Time 8502    PT Stop Time 1648    PT Time Calculation (min) 43 min    Equipment Utilized During Treatment Gait belt    Activity Tolerance Patient tolerated treatment well    Behavior During Therapy WFL for tasks assessed/performed             History reviewed. No pertinent past medical history.  Past Surgical History:  Procedure Laterality Date   WISDOM TOOTH EXTRACTION      There were no vitals filed for this visit.   Subjective Assessment - 01/11/21 1610     Subjective Patient is getting more botox in the arm in August. No new changes.    Pertinent History HTN    Limitations Walking    Patient Stated Goals wants to work on not walking with the cane    Currently in Pain? No/denies                               [facility] Adult PT Treatment/Exercise - 01/11/21 0001       Transfers   Transfers Sit to Stand;Stand to Sit    Sit to Stand 6: Modified independent (Device/Increase time)    Stand to Sit 6: Modified independent (Device/Increase time)      Ambulation/Gait   Ambulation/Gait Yes    Ambulation/Gait Assistance 5: Supervision;4: Min guard    Ambulation/Gait Assistance Details ambulating into session with AD, completed gait training without AD x 300 ft. PT providing facilitation for improved step length and stance time.    Ambulation Distance (Feet) 300 Feet     Assistive device Straight cane;None    Gait Pattern Step-through pattern;Decreased step length - left;Decreased stance time - left;Decreased hip/knee flexion - left;Decreased arm swing - left    Ambulation Surface Level;Indoor    Gait Comments following ambulation on land, completed BWS over treadmill: at 0.8 mph with RUE support, approx 18# unweighted, x 9 minutes. continue to provide cues for incr stance time with LLE and for incr step length with RLE, therapist helping to faciliate with foot clearance with LLE.  Increased fatigue at end of ambulation.      Neuro Re-ed    Neuro Re-ed Details  with patient supine: completed D1 PNF pattern with LLE x 3 minutes passively then progressed to completing with resistance provided from PT x 10 reps into flexion/extension. Cues required.                    PT Education - 01/11/21 1648     Education Details progress toward STGs    Person(s) Educated Patient    Methods Explanation    Comprehension Verbalized understanding              PT Short Term Goals - 01/11/21  [place] #1   Title Pt will report return to gym at least 2x/week for participation on stationary stepper for improved endurance/strength (All STGS: 01/05/21)    Baseline has not went at this time; has not returned to gym at this time.    Time 4    Period Weeks    Status Not Met    Target Date 01/05/21      PT SHORT TERM GOAL #2   Title 6MWT to be assessed and LTG to be set    Baseline assessed and LTG set    Time 4    Period Weeks    Status Achieved               PT Long Term Goals - 12/15/20 1357       PT LONG TERM GOAL #1   Title Pt and pt's spouse will be independent with final HEP in order to build upon functional gains made in therapy. (ALL LTGS DUE 02/02/21)    Baseline pt reports independence with HEP, will continue to benefit from progressive HEP    Time 8    Period Weeks    Status On-going      PT LONG TERM GOAL #2    Title Pt will ambulate small distances of 20-30' with no AD, Mod I while holding a plate in order for pt to ambulate from his kitchen to the dining room safely.    Baseline 130' carrying plate intermittent supervision/CGA    Time 8    Period Weeks    Status Revised      PT LONG TERM GOAL #3   Title Pt will perform TUG in 19 seconds or less with LRAD in order to demo decr fall risk.    Baseline 23.91 seconds; 22.79 seconds with hurry cane; 21.32 secs w/ hurrycane    Time 8    Period Weeks    Status On-going      PT LONG TERM GOAL #4   Title Patient will be able to ambulate >/= 600 ft with completion of 6MWT with LRAD vs. no AD to demo improved endurance    Baseline 479 ft with SPC    Time 8    Period Weeks    Status Revised      PT LONG TERM GOAL #5   Title Pt will improve gait speed to at least 1.8 ft/sec with hurry cane in order to demo decr fall risk.    Baseline 1.35 ft/sec; 1.65 ft/sec    Time 8    Period Weeks    Status Revised                   Plan - 01/11/21 1605     Clinical Impression Statement Completed assessment of patient's progress toward STG, patient able to meet STG #1 and 2. Initiated gait training overground without AD working on improved gait pattern and step length. Progressed to gait with BWS with focus on continued gait training with patient tolerating well. Patient is making steady progress and will continue to progress toward all LTGs.    Personal Factors and Comorbidities Comorbidity 1;Past/Current Experience;Profession    Comorbidities HTN    Examination-Activity Limitations Stand;Stairs;Squat;Transfers;Locomotion Level    Examination-Participation Restrictions Community Activity;Occupation   walking his dog   Stability/Clinical Decision Making Stable/Uncomplicated    Rehab Potential Good    PT Frequency 1x / week    PT Duration 8 weeks  PT Treatment/Interventions ADLs/Self Care Home Management;Aquatic Therapy;Electrical Stimulation;DME  Instruction;Gait training;Stair training;Functional mobility training;Therapeutic activities;Therapeutic exercise;Balance training;Neuromuscular re-education;Wheelchair mobility training;Orthotic Fit/Training;Patient/family education;Passive range of motion;Energy conservation;Vestibular    PT Next Visit Plan begin with gait with no AD before pt gets too fatigued! Continue SciFit. tall kneel and half kneeling, balance strategies on different surfaces/decr UE support, NMR/strengthening for LLE and SLS. can continue treadmill training for BWS.    PT Home Exercise Plan [id number]    Consulted and Agree with Plan of Care Patient             Patient will benefit from skilled therapeutic intervention in order to improve the following deficits and impairments:  Abnormal gait, Decreased activity tolerance, Decreased balance, Decreased mobility, Decreased knowledge of use of DME, Decreased coordination, Decreased range of motion, Decreased strength, Difficulty walking, Impaired tone, Impaired sensation, Postural dysfunction  Visit Diagnosis: Muscle weakness (generalized)  Other abnormalities of gait and mobility  Unsteadiness on feet     Problem List Patient Active Problem List   Diagnosis Date Noted   Elevated glucose 10/13/2020   Hyperlipidemia associated with type 2 diabetes mellitus ([place]) 10/05/2020   History of CVA (cerebrovascular accident) 01/19/2020   Normocytic anemia    Benign essential HTN    Hyponatremia    Acute blood loss anemia    Hypotension due to drugs    Vascular headache    Prerenal azotemia    Sleep disturbance    Leukocytosis    Transaminitis    Marijuana abuse    Essential hypertension    Dysphagia, post-stroke    Left hemiparesis ([place])    Acute respiratory failure (HCC)    ICH (intracerebral hemorrhage) ([place]) 12/06/2019    Jones Bales, PT, DPT 01/11/2021, 4:57 PM  [facility] [address] [place], [place], [address] Phone: [phone]   Fax:  [phone]  Name: Mujahid Jalomo MRN: [id number] Date of Birth: 08/15/1980

## 2021-01-12 ENCOUNTER — Ambulatory Visit: Payer: Managed Care, Other (non HMO)

## 2021-01-15 ENCOUNTER — Ambulatory Visit: Payer: Managed Care, Other (non HMO) | Admitting: Occupational Therapy

## 2021-01-19 ENCOUNTER — Other Ambulatory Visit: Payer: Self-pay

## 2021-01-19 ENCOUNTER — Ambulatory Visit: Payer: Managed Care, Other (non HMO) | Attending: Physician Assistant

## 2021-01-19 DIAGNOSIS — R208 Other disturbances of skin sensation: Secondary | ICD-10-CM | POA: Insufficient documentation

## 2021-01-19 DIAGNOSIS — R2681 Unsteadiness on feet: Secondary | ICD-10-CM | POA: Diagnosis present

## 2021-01-19 DIAGNOSIS — M6281 Muscle weakness (generalized): Secondary | ICD-10-CM | POA: Diagnosis present

## 2021-01-19 DIAGNOSIS — R2689 Other abnormalities of gait and mobility: Secondary | ICD-10-CM | POA: Insufficient documentation

## 2021-01-19 DIAGNOSIS — I69354 Hemiplegia and hemiparesis following cerebral infarction affecting left non-dominant side: Secondary | ICD-10-CM | POA: Diagnosis present

## 2021-01-19 DIAGNOSIS — R29818 Other symptoms and signs involving the nervous system: Secondary | ICD-10-CM | POA: Insufficient documentation

## 2021-01-19 NOTE — Therapy (Signed)
[facility] [address] [place], [place], [address] Phone: [phone]   Fax:  [phone]  Physical Therapy Treatment  Patient Details  Name: Dennis Mercado MRN: [id number] Date of Birth: 1981/02/06 Referring Provider (PT): [name], MD   Encounter Date: 01/19/2021   PT End of Session - 01/19/21 1320     Visit Number 20    Number of Visits 22    Date for PT Re-Evaluation 02/02/21    Authorization Type VL: 60 visits PT, 30 OT    Authorization - Visit Number 19    Authorization - Number of Visits 60    PT Start Time 4920    PT Stop Time 1400    PT Time Calculation (min) 43 min    Equipment Utilized During Treatment Gait belt    Activity Tolerance Patient tolerated treatment well    Behavior During Therapy [facility] for tasks assessed/performed             History reviewed. No pertinent past medical history.  Past Surgical History:  Procedure Laterality Date   WISDOM TOOTH EXTRACTION      There were no vitals filed for this visit.   Subjective Assessment - 01/19/21 1321     Subjective Patient passed his last test! Now waiting on his license. No new changes or complaints.    Pertinent History HTN    Limitations Walking    Patient Stated Goals wants to work on not walking with the cane    Currently in Pain? No/denies                [facility] Adult PT Treatment/Exercise - 01/19/21 0001       Transfers   Transfers Sit to Stand;Stand to Sit    Sit to Stand 6: Modified independent (Device/Increase time)    Stand to Sit 6: Modified independent (Device/Increase time)      Ambulation/Gait   Ambulation/Gait Yes    Ambulation/Gait Assistance 5: Supervision;4: Min guard    Ambulation/Gait Assistance Details completed ambulation around therapy gym without AD, working on improved step length and weight shift. PT providing cues for upright posture and engaging glutes for improved hip extension.     Ambulation Distance (Feet) 280 Feet   plus additiona distances throughout clinic   Assistive device Straight cane;None    Gait Pattern Step-through pattern;Decreased step length - left;Decreased stance time - left;Decreased hip/knee flexion - left;Decreased arm swing - left    Ambulation Surface Level;Indoor      Neuro Re-ed    Neuro Re-ed Details  NMR on red mat in floor with mat table in front and bench positioned on R side: completed tall kneeling with working on weight shift onto LLE and reaching across body with RUE to grab cone placed on L side, completed x 12 reps. PT providing cues for faciliation of weight shift at pelvis. Completed half kneeling with LLE posterior and no UE support 3 x 30 seconds, increased challenge in hald kneel > tall kneel. Then added horizontal head turns x 10 reps. Then alternating to place RLE posterior and LLE posterior, completed holding without UE support 3 x 30 seconds, with increased sway, followed by addition of horizontal head turns x 10 reps. Worked on placing LLE anterior and pushign to stand to work on floor <> stand transfer. Patient able to complete with supervision.      Exercises   Exercises Knee/Hip      Knee/Hip Exercises: Aerobic   Other  Aerobic Completed SciFit with BUE/BLE x 6 miuntes on Level 2.5 for improved recirpocal movement.              PT Short Term Goals - 01/11/21 1612       PT SHORT TERM GOAL #1   Title Pt will report return to gym at least 2x/week for participation on stationary stepper for improved endurance/strength (All STGS: 01/05/21)    Baseline has not went at this time; has not returned to gym at this time.    Time 4    Period Weeks    Status Not Met    Target Date 01/05/21      PT SHORT TERM GOAL #2   Title 6MWT to be assessed and LTG to be set    Baseline assessed and LTG set    Time 4    Period Weeks    Status Achieved               PT Long Term Goals - 12/15/20 1357       PT LONG TERM GOAL #1    Title Pt and pt's spouse will be independent with final HEP in order to build upon functional gains made in therapy. (ALL LTGS DUE 02/02/21)    Baseline pt reports independence with HEP, will continue to benefit from progressive HEP    Time 8    Period Weeks    Status On-going      PT LONG TERM GOAL #2   Title Pt will ambulate small distances of 20-30' with no AD, Mod I while holding a plate in order for pt to ambulate from his kitchen to the dining room safely.    Baseline 130' carrying plate intermittent supervision/CGA    Time 8    Period Weeks    Status Revised      PT LONG TERM GOAL #3   Title Pt will perform TUG in 19 seconds or less with LRAD in order to demo decr fall risk.    Baseline 23.91 seconds; 22.79 seconds with hurry cane; 21.32 secs w/ hurrycane    Time 8    Period Weeks    Status On-going      PT LONG TERM GOAL #4   Title Patient will be able to ambulate >/= 600 ft with completion of 6MWT with LRAD vs. no AD to demo improved endurance    Baseline 479 ft with SPC    Time 8    Period Weeks    Status Revised      PT LONG TERM GOAL #5   Title Pt will improve gait speed to at least 1.8 ft/sec with hurry cane in order to demo decr fall risk.    Baseline 1.35 ft/sec; 1.65 ft/sec    Time 8    Period Weeks    Status Revised                   Plan - 01/19/21 1458     Clinical Impression Statement Continued gait training without AD, patient continue to demo increased anxiousness when ambulating without AD impacting tone. Coneinued SciFit for improved recirpocal movement, as well as tall/half kneeling NMR. Most challenge noted with half kneeling position and addition of head turns. Will continue to progress toward all LTGs.    Personal Factors and Comorbidities Comorbidity 1;Past/Current Experience;Profession    Comorbidities HTN    Examination-Activity Limitations Stand;Stairs;Squat;Transfers;Locomotion Level    Examination-Participation Restrictions Community  Activity;Occupation   walking his dog  Stability/Clinical Decision Making Stable/Uncomplicated    Rehab Potential Good    PT Frequency 1x / week    PT Duration 8 weeks    PT Treatment/Interventions ADLs/Self Care Home Management;Aquatic Therapy;Electrical Stimulation;DME Instruction;Gait training;Stair training;Functional mobility training;Therapeutic activities;Therapeutic exercise;Balance training;Neuromuscular re-education;Wheelchair mobility training;Orthotic Fit/Training;Patient/family education;Passive range of motion;Energy conservation;Vestibular    PT Next Visit Plan begin with gait with no AD before pt gets too fatigued! Continue SciFit. tall kneel and half kneeling, balance strategies on different surfaces/decr UE support, NMR/strengthening for LLE and SLS. can continue treadmill training for BWS. Schedule additional visit as still will be within POC. Patient will need Re-Cert at last scheduled visit, reports he would like to continue PT at this time if possible.    PT Home Exercise Plan [id number]    Consulted and Agree with Plan of Care Patient             Patient will benefit from skilled therapeutic intervention in order to improve the following deficits and impairments:  Abnormal gait, Decreased activity tolerance, Decreased balance, Decreased mobility, Decreased knowledge of use of DME, Decreased coordination, Decreased range of motion, Decreased strength, Difficulty walking, Impaired tone, Impaired sensation, Postural dysfunction  Visit Diagnosis: Muscle weakness (generalized)  Other abnormalities of gait and mobility  Unsteadiness on feet     Problem List Patient Active Problem List   Diagnosis Date Noted   Elevated glucose 10/13/2020   Hyperlipidemia associated with type 2 diabetes mellitus ([place]) 10/05/2020   History of CVA (cerebrovascular accident) 01/19/2020   Normocytic anemia    Benign essential HTN    Hyponatremia    Acute blood loss anemia     Hypotension due to drugs    Vascular headache    Prerenal azotemia    Sleep disturbance    Leukocytosis    Transaminitis    Marijuana abuse    Essential hypertension    Dysphagia, post-stroke    Left hemiparesis ([place])    Acute respiratory failure (HCC)    ICH (intracerebral hemorrhage) ([place]) 12/06/2019    Jones Bales, PT, DPT 01/19/2021, 3:05 PM  [facility] [address] [place], [place], [address] Phone: [phone]   Fax:  [phone]  Name: Kristi Hyer MRN: [id number] Date of Birth: 07-26-1980

## 2021-01-22 ENCOUNTER — Ambulatory Visit: Payer: Managed Care, Other (non HMO) | Admitting: Occupational Therapy

## 2021-01-23 ENCOUNTER — Ambulatory Visit: Payer: Managed Care, Other (non HMO) | Admitting: Physical Therapy

## 2021-01-26 ENCOUNTER — Ambulatory Visit: Payer: Managed Care, Other (non HMO) | Admitting: Physical Therapy

## 2021-01-29 ENCOUNTER — Other Ambulatory Visit: Payer: Self-pay | Admitting: Family Medicine

## 2021-01-30 ENCOUNTER — Encounter: Payer: Self-pay | Admitting: Physical Therapy

## 2021-01-30 ENCOUNTER — Ambulatory Visit: Payer: Managed Care, Other (non HMO) | Admitting: Physical Therapy

## 2021-01-30 ENCOUNTER — Other Ambulatory Visit: Payer: Self-pay

## 2021-01-30 DIAGNOSIS — R2681 Unsteadiness on feet: Secondary | ICD-10-CM

## 2021-01-30 DIAGNOSIS — M6281 Muscle weakness (generalized): Secondary | ICD-10-CM | POA: Diagnosis not present

## 2021-01-30 DIAGNOSIS — R2689 Other abnormalities of gait and mobility: Secondary | ICD-10-CM

## 2021-01-30 DIAGNOSIS — I69354 Hemiplegia and hemiparesis following cerebral infarction affecting left non-dominant side: Secondary | ICD-10-CM

## 2021-01-30 NOTE — Therapy (Addendum)
Dennis Mercado [address] [place], [place], [address] Phone: [phone]   Fax:  [phone]  Physical Therapy Treatment/Re-Cert  Patient Details  Name: Dennis Mercado MRN: [id number] Date of Birth: Jul 15, 1980 Referring Provider (PT): Letta Pate [name], MD   Encounter Date: 01/30/2021   PT End of Session - 01/30/21 1925     Visit Number 21    Number of Visits 29    Date for PT Re-Evaluation 03/31/21    Authorization Type VL: 66 visits PT, 30 OT    Authorization - Visit Number 20    Authorization - Number of Visits 60    PT Start Time 7867    PT Stop Time 1830    PT Time Calculation (min) 41 min    Equipment Utilized During Treatment Gait belt    Activity Tolerance Patient tolerated treatment well    Behavior During Therapy [facility] for tasks assessed/performed             History reviewed. No pertinent past medical history.  Past Surgical History:  Procedure Laterality Date   WISDOM TOOTH EXTRACTION      There were no vitals filed for this visit.   Subjective Assessment - 01/30/21 1752     Subjective Looking for a job. Went to [employer] for his birthday and had a great time.    Pertinent History HTN    Limitations Walking    Patient Stated Goals wants to work on not walking with the cane    Currently in Pain? No/denies                [facility] PT Assessment - 01/30/21 1800       Assessment   Medical Diagnosis R thalamic hemorrhage w/ L hemiparesis    Referring Provider (PT) [name], MD    Onset Date/Surgical Date 12/06/19    Hand Dominance Right      6 Minute Walk- Baseline   6 Minute Walk- Baseline yes    BP (mmHg) 135/88    HR (bpm) 94      6 Minute walk- Post Test   6 Minute Walk Post Test yes    BP (mmHg) 137/89    HR (bpm) 103      6 minute walk test results    Aerobic Endurance Distance Walked 509    Endurance additional comments with SPC with 3 prong tip      Timed  Up and Go Test   Normal TUG (seconds) 32.44    TUG Comments no cane                           [facility] Adult PT Treatment/Exercise - 01/30/21 1800       Ambulation/Gait   Ambulation/Gait Yes    Ambulation/Gait Assistance 5: Supervision;4: Min guard    Ambulation/Gait Assistance Details holding a plate with golf balls and pegs with no AD for dual tasking with min guard and then supervision, then holding just a plate with no AD with mod I for 50'. plus 6MWT with cane, see below for information    Ambulation Distance (Feet) 115 Feet    Assistive device Straight cane;None    Gait Pattern Step-through pattern;Decreased step length - left;Decreased stance time - left;Decreased hip/knee flexion - left;Decreased arm swing - left    Ambulation Surface Level;Indoor      Exercises   Exercises Knee/Hip    Other Exercises  with  RUE support: keeping RLE planted on step and stepping LLE onto 2nd step with therapist helping to assist with knee flexion, repeated with LLE as stance leg. reviewed stretches in supine that pt can perform at the beginning of the day: single knee to chest stretch with LLE 2 x 30 seconds, lower trunk rotations x10 reps, also demonstrated a different way to perform seated hamstring stretch (propping leg straight out on a chair/ottoman) and sitting up with tall posture, pt reporting that this will work better for him at home                      PT Short Term Goals - 01/11/21 1612       PT SHORT TERM GOAL #1   Title Pt will report return to gym at least 2x/week for participation on stationary stepper for improved endurance/strength (All STGS: 01/05/21)    Baseline has not went at this time; has not returned to gym at this time.    Time 4    Period Weeks    Status Not Met    Target Date 01/05/21      PT SHORT TERM GOAL #2   Title 6MWT to be assessed and LTG to be set    Baseline assessed and LTG set    Time 4    Period Weeks    Status Achieved              New STGs:    PT Short Term Goals - 01/31/21 1300       PT SHORT TERM GOAL #1   Title Pt will undergo further assessment of gait speed with no AD with LTG written. ALL STGS DUE 02/28/21    Baseline not yet assessed.    Time 4    Period Weeks    Status New    Target Date 02/28/21      PT SHORT TERM GOAL #2   Title Pt will decr TUG time with no AD to 29 seconds or less in order to demo decr fall risk.    Baseline 32.44 seconds    Time 4    Period Weeks    Status New      PT SHORT TERM GOAL #3   Title Pt will ambulate at least 230' on indoor level surfaces with supervision with no AD in order to demo improved household mobility.    Time 4    Period Weeks    Status New               PT Long Term Goals - 01/30/21 1811       PT LONG TERM GOAL #1   Title Pt and pt's spouse will be independent with final HEP in order to build upon functional gains made in therapy. (ALL LTGS DUE 02/02/21)    Baseline pt reports independence with HEP, will continue to benefit from progressive HEP    Time 8    Period Weeks    Status On-going      PT LONG TERM GOAL #2   Title Pt will ambulate small distances of 20-30' with no AD, Mod I while holding a plate in order for pt to ambulate from his kitchen to the dining room safely.    Baseline met on 01/31/21    Time 8    Period Weeks    Status Achieved      PT LONG TERM GOAL #3   Title Pt will perform TUG in  19 seconds or less with LRAD in order to demo decr fall risk.    Baseline 23.91 seconds; 22.79 seconds with hurry cane; 21.32 secs w/ hurrycane    Time 8    Period Weeks    Status On-going      PT LONG TERM GOAL #4   Title Patient will be able to ambulate >/= 600 ft with completion of 6MWT with LRAD vs. no AD to demo improved endurance    Baseline 479 ft with SPC, 509' with 01/30/21    Time 8    Period Weeks    Status Not Met      PT LONG TERM GOAL #5   Title Pt will improve gait speed to at least 1.8 ft/sec with  hurry cane in order to demo decr fall risk.    Baseline 1.35 ft/sec; 1.65 ft/sec    Time 8    Period Weeks    Status Revised            Updated/revised LTGs:    PT Long Term Goals - 01/30/21 1811       PT LONG TERM GOAL #1   Title Pt and pt's spouse will be independent with final HEP in order to build upon functional gains made in therapy. (ALL LTGS DUE 02/02/21)    Baseline pt reports independence with HEP, will continue to benefit from progressive HEP    Time 8    Period Weeks    Status On-going      PT LONG TERM GOAL #2   Title Pt will ambulate small distances of 20-30' with no AD, Mod I while holding a plate in order for pt to ambulate from his kitchen to the dining room safely.    Baseline met on 01/31/21    Time 8    Period Weeks    Status Achieved      PT LONG TERM GOAL #3   Title Pt will perform TUG in 19 seconds or less with LRAD in order to demo decr fall risk.    Baseline 23.91 seconds; 22.79 seconds with hurry cane; 21.32 secs w/ hurrycane    Time 8    Period Weeks    Status On-going      PT LONG TERM GOAL #4   Title Patient will be able to ambulate >/= 600 ft with completion of 6MWT with LRAD vs. no AD to demo improved endurance    Baseline 479 ft with SPC, 509' with 01/30/21    Time 8    Period Weeks    Status Not Met      PT LONG TERM GOAL #5   Title Pt will improve gait speed to at least 1.8 ft/sec with hurry cane in order to demo decr fall risk.    Baseline 1.35 ft/sec; 1.65 ft/sec    Time 8    Period Weeks    Status Revised                  Plan - 01/31/21 1250     Clinical Impression Statement Today's skilled session focused on checking LTGs for re-cert. Pt met LTG #2 - able to ambulate small distances with no AD while dual tasking by holding a plate for small distances in the house. Pt did not meet LTG #4, with 6MWT, did improved distance to 509' (previously was 479'). Pt's goals with therapy are to continue to work on walking/balance  with no AD. Performed the TUG today with no AD with  pt performing in 32.44 seconds, putting pt at an incr risk for falls. Unable to perform remainder of walking tests due to pt's LLE being more fatigued which would not give an accurate representation of gait speed. Will re-cert for an additional 1x week for 6-8 weeks to continue to work on strength, balance, gait with no AD, ROM, in order to improve functional mobility and decr fall risk.    Personal Factors and Comorbidities Comorbidity 1;Past/Current Experience;Profession    Comorbidities HTN    Examination-Activity Limitations Stand;Stairs;Squat;Transfers;Locomotion Level    Examination-Participation Restrictions Community Activity;Occupation   walking his dog   Stability/Clinical Decision Making Stable/Uncomplicated    Rehab Potential Good    PT Frequency 1x / week    PT Duration 8 weeks   6-8 weeks   PT Treatment/Interventions ADLs/Self Care Home Management;Aquatic Therapy;Electrical Stimulation;DME Instruction;Gait training;Stair training;Functional mobility training;Therapeutic activities;Therapeutic exercise;Balance training;Neuromuscular re-education;Wheelchair mobility training;Orthotic Fit/Training;Patient/family education;Passive range of motion;Energy conservation;Vestibular    PT Next Visit Plan begin with gait with no AD before pt gets too fatigued! Continue Sci[employer]. tall kneel and half kneeling, balance strategies on different surfaces/decr UE support, NMR/strengthening for LLE and SLS. can continue treadmill training for BWS.    PT Home Exercise Plan [email or web address]    Consulted and Agree with Plan of Care Patient             Patient will benefit from skilled therapeutic intervention in order to improve the following deficits and impairments:  Abnormal gait, Decreased activity tolerance, Decreased balance, Decreased mobility, Decreased knowledge of use of DME, Decreased coordination, Decreased range of motion, Decreased strength,  Difficulty walking, Impaired tone, Impaired sensation, Postural dysfunction  Visit Diagnosis: Muscle weakness (generalized)  Other abnormalities of gait and mobility  Unsteadiness on feet  Hemiplegia and hemiparesis following cerebral infarction affecting left non-dominant side [facility])     Problem List Patient Active Problem List   Diagnosis Date Noted   Elevated glucose 10/13/2020   Hyperlipidemia associated with type 2 diabetes mellitus ([place]) 10/05/2020   History of CVA (cerebrovascular accident) 01/19/2020   Normocytic anemia    Benign essential HTN    Hyponatremia    Acute blood loss anemia    Hypotension due to drugs    Vascular headache    Prerenal azotemia    Sleep disturbance    Leukocytosis    Transaminitis    Marijuana abuse    Essential hypertension    Dysphagia, post-stroke    Left hemiparesis (HCC)    Acute respiratory failure (HCC)    ICH (intracerebral hemorrhage) ([place]) 12/06/2019    Arliss Journey, PT, DPT  01/31/2021, 12:57 PM  [place] [address] [place], [place], [address] Phone: [phone]   Fax:  [phone]  Name: Adolphe Fortunato MRN: [id number] Date of Birth: 16-Aug-1980

## 2021-01-31 ENCOUNTER — Encounter: Payer: Self-pay | Admitting: Family Medicine

## 2021-01-31 ENCOUNTER — Ambulatory Visit: Payer: Managed Care, Other (non HMO) | Admitting: Family Medicine

## 2021-01-31 VITALS — BP 122/76 | HR 92 | Temp 97.0°F | Ht 67.0 in | Wt 155.0 lb

## 2021-01-31 DIAGNOSIS — R7309 Other abnormal glucose: Secondary | ICD-10-CM | POA: Diagnosis not present

## 2021-01-31 DIAGNOSIS — G8194 Hemiplegia, unspecified affecting left nondominant side: Secondary | ICD-10-CM | POA: Diagnosis not present

## 2021-01-31 DIAGNOSIS — E1169 Type 2 diabetes mellitus with other specified complication: Secondary | ICD-10-CM | POA: Diagnosis not present

## 2021-01-31 DIAGNOSIS — I10 Essential (primary) hypertension: Secondary | ICD-10-CM

## 2021-01-31 DIAGNOSIS — E785 Hyperlipidemia, unspecified: Secondary | ICD-10-CM

## 2021-01-31 LAB — BASIC METABOLIC PANEL
BUN: 12 mg/dL (ref 6–23)
CO2: 29 mEq/L (ref 19–32)
Calcium: 10.2 mg/dL (ref 8.4–10.5)
Chloride: 99 mEq/L (ref 96–112)
Creatinine, Ser: 1.23 mg/dL (ref 0.40–1.50)
GFR: 73.69 mL/min (ref >60.00)
Glucose, Bld: 91 mg/dL (ref 70–99)
Potassium: 4.2 mEq/L (ref 3.5–5.1)
Sodium: 138 mEq/L (ref 135–145)

## 2021-01-31 LAB — HEMOGLOBIN A1C: Hgb A1c MFr Bld: 6.1 % (ref 4.6–6.5)

## 2021-01-31 MED ORDER — LISINOPRIL 5 MG PO TABS: 5.0000 mg | ORAL_TABLET | Freq: Every day | ORAL | 1 refills | Status: DC

## 2021-01-31 MED ORDER — AMLODIPINE BESYLATE 10 MG PO TABS: 10.0000 mg | ORAL_TABLET | Freq: Every day | ORAL | 1 refills | Status: DC

## 2021-01-31 MED ORDER — ATORVASTATIN CALCIUM 40 MG PO TABS: 40.0000 mg | ORAL_TABLET | Freq: Every day | ORAL | 1 refills | Status: DC

## 2021-01-31 NOTE — Addendum Note (Signed)
Addended by: Arliss Journey on: 01/31/2021 01:06 PM   Modules accepted: Orders

## 2021-01-31 NOTE — Progress Notes (Signed)
Established Patient Office Visit  Subjective:  Patient ID: Dennis Mercado, male    DOB: [birth year]  Age: 40 y.o. MRN: [id number]  CC:  Chief Complaint  Patient presents with   Follow-up    3 month follow up on BP, patient would like to know if it is okay to have wisdom teeth pulled.     HPI Dennis Mercado presents for follow-up of his blood pressure, hyperlipidemia and elevated glucose.  Last hemoglobin A1c was slightly elevated at 5.9.  He has no family history of diabetes he tells me.  He continues working with rehabilitation.  He continues to ambulate with his cane.  Blood pressures been well controlled with amlodipine and lisinopril.  Cholesterol well controlled with atorvastatin.  He is in need of wisdom tooth extraction.  History reviewed. No pertinent past medical history.  Past Surgical History:  Procedure Laterality Date   WISDOM TOOTH EXTRACTION      History reviewed. No pertinent family history.  Social History   Socioeconomic History   Marital status: Married    Spouse name: Not on file   Number of children: Not on file   Years of education: Not on file   Highest education level: Not on file  Occupational History   Not on file  Tobacco Use   Smoking status: Never   Smokeless tobacco: Never  Vaping Use   Vaping Use: Never used  Substance and Sexual Activity   Alcohol use: Not Currently    Comment: several drinks over the weekend   Drug use: Never   Sexual activity: Not on file  Other Topics Concern   Not on file  Social History Narrative   Not on file   Social Determinants of Health   Financial Resource Strain: Not on file  Food Insecurity: Not on file  Transportation Needs: Not on file  Physical Activity: Not on file  Stress: Not on file  Social Connections: Not on file  Intimate Partner Violence: Not on file    Outpatient Medications Prior to Visit  Medication Sig Dispense Refill   amLODipine (NORVASC) 10 MG tablet TAKE 1 TABLET(10 MG) BY  MOUTH DAILY 90 tablet 0   atorvastatin (LIPITOR) 40 MG tablet TAKE 1 TABLET(40 MG) BY MOUTH DAILY 90 tablet 0   lisinopril (ZESTRIL) 5 MG tablet TAKE 1 TABLET(5 MG) BY MOUTH DAILY 90 tablet 0   carvedilol (COREG) 25 MG tablet TAKE 1 TABLET(25 MG) BY MOUTH TWICE DAILY WITH A MEAL 90 tablet 0   No facility-administered medications prior to visit.    Allergies  Allergen Reactions   Percocet [Oxycodone-Acetaminophen] Itching    Pt says he has previously tolerated.     ROS Review of Systems  Constitutional:  Negative for diaphoresis, fatigue, fever and unexpected weight change.  HENT: Negative.    Eyes:  Negative for photophobia and visual disturbance.  Respiratory: Negative.    Cardiovascular: Negative.   Gastrointestinal: Negative.   Endocrine: Negative for polyphagia and polyuria.  Genitourinary: Negative.   Neurological:  Positive for weakness and numbness. Negative for speech difficulty.  Psychiatric/Behavioral: Negative.       Objective:    Physical Exam Vitals and nursing note reviewed.  Constitutional:      General: He is not in acute distress.    Appearance: Normal appearance. He is normal weight. He is not ill-appearing, toxic-appearing or diaphoretic.  HENT:     Head: Normocephalic and atraumatic.     Right Ear: External ear normal.  Left Ear: External ear normal.     Mouth/Throat:     Mouth: Mucous membranes are moist.     Dentition: Abnormal dentition (posterior molars with fractures.).     Pharynx: Oropharynx is clear. No oropharyngeal exudate or posterior oropharyngeal erythema.  Eyes:     General: No scleral icterus.       Right eye: No discharge.        Left eye: No discharge.     Extraocular Movements: Extraocular movements intact.     Conjunctiva/sclera: Conjunctivae normal.     Pupils: Pupils are equal, round, and reactive to light.  Neck:     Vascular: No carotid bruit.  Cardiovascular:     Rate and Rhythm: Normal rate and regular rhythm.   Pulmonary:     Effort: Pulmonary effort is normal.     Breath sounds: Normal breath sounds.  Abdominal:     General: Bowel sounds are normal.  Musculoskeletal:     Cervical back: No rigidity or tenderness.  Lymphadenopathy:     Cervical: No cervical adenopathy.  Skin:    General: Skin is warm and dry.  Neurological:     Mental Status: He is alert and oriented to person, place, and time.    BP 122/76 (BP Location: Right Arm, Patient Position: Sitting, Cuff Size: Normal)   Pulse 92   Temp (!) 97 F (36.1 C) (Temporal)   Ht '5\' 7"'$  (1.702 m)   Wt 155 lb (70.3 kg)   SpO2 98%   BMI 24.28 kg/m  Wt Readings from Last 3 Encounters:  01/31/21 155 lb (70.3 kg)  12/28/20 151 lb (68.5 kg)  10/31/20 147 lb 3.2 oz (66.8 kg)     Health Maintenance Due  Topic Date Due   PNEUMOCOCCAL POLYSACCHARIDE VACCINE AGE 3-64 HIGH RISK  Never done   FOOT EXAM  Never done   OPHTHALMOLOGY EXAM  Never done   Hepatitis C Screening  Never done   INFLUENZA VACCINE  01/15/2021    There are no preventive care reminders to display for this patient.  No results found for: TSH Lab Results  Component Value Date   WBC 6.6 10/05/2020   HGB 13.2 10/05/2020   HCT 40.8 10/05/2020   MCV 81.7 10/05/2020   PLT 294.0 10/05/2020   Lab Results  Component Value Date   NA 140 10/31/2020   K 4.6 10/31/2020   CO2 32 10/31/2020   GLUCOSE 88 10/31/2020   BUN 16 10/31/2020   CREATININE 1.22 10/31/2020   BILITOT 0.5 10/05/2020   ALKPHOS 63 10/05/2020   AST 15 10/05/2020   ALT 21 10/05/2020   PROT 7.5 10/05/2020   ALBUMIN 4.7 10/05/2020   CALCIUM 10.1 10/31/2020   ANIONGAP 10 01/10/2020   GFR 74.55 10/31/2020   Lab Results  Component Value Date   CHOL 106 10/05/2020   Lab Results  Component Value Date   HDL 37.50 (L) 10/05/2020   Lab Results  Component Value Date   LDLCALC 58 10/05/2020   Lab Results  Component Value Date   TRIG 55.0 10/05/2020   Lab Results  Component Value Date   CHOLHDL  3 10/05/2020   Lab Results  Component Value Date   HGBA1C 5.9 10/31/2020      Assessment & Plan:   Problem List Items Addressed This Visit       Cardiovascular and Mediastinum   Essential hypertension - Primary   Relevant Medications   amLODipine (NORVASC) 10 MG tablet   lisinopril (  ZESTRIL) 5 MG tablet   atorvastatin (LIPITOR) 40 MG tablet     Endocrine   Hyperlipidemia associated with type 2 diabetes mellitus (HCC)   Relevant Medications   lisinopril (ZESTRIL) 5 MG tablet   atorvastatin (LIPITOR) 40 MG tablet     Nervous and Auditory   Left hemiparesis (HCC)     Other   Elevated glucose   Relevant Orders   Basic metabolic panel   Hemoglobin A1c    Meds ordered this encounter  Medications   amLODipine (NORVASC) 10 MG tablet    Sig: Take 1 tablet (10 mg total) by mouth daily.    Dispense:  90 tablet    Refill:  1   lisinopril (ZESTRIL) 5 MG tablet    Sig: Take 1 tablet (5 mg total) by mouth daily.    Dispense:  90 tablet    Refill:  1   atorvastatin (LIPITOR) 40 MG tablet    Sig: Take 1 tablet (40 mg total) by mouth daily.    Dispense:  90 tablet    Refill:  1     Follow-up: Return in about 3 months (around 05/03/2021), or if symptoms worsen or fail to improve.   Rechecking hemoglobin A1c.  We discussed starting metformin if it remains elevated.  At this point I do not believe his early diabetes will preclude wisdom tooth extraction for him. Libby Maw, MD

## 2021-02-01 ENCOUNTER — Encounter: Payer: Self-pay | Admitting: Physical Medicine & Rehabilitation

## 2021-02-01 ENCOUNTER — Encounter
Payer: Managed Care, Other (non HMO) | Attending: Physical Medicine & Rehabilitation | Admitting: Physical Medicine & Rehabilitation

## 2021-02-01 ENCOUNTER — Other Ambulatory Visit: Payer: Self-pay

## 2021-02-01 VITALS — BP 129/88 | HR 95 | Temp 98.9°F | Ht 67.0 in | Wt 152.0 lb

## 2021-02-01 DIAGNOSIS — G8114 Spastic hemiplegia affecting left nondominant side: Secondary | ICD-10-CM

## 2021-02-01 NOTE — Progress Notes (Signed)
Dysport Injection for spasticity using needle EMG guidance  Dilution: 200 Units/ml Indication: Severe spasticity which interferes with ADL,mobility and/or  hygiene and is unresponsive to medication management and other conservative care Informed consent was obtained after describing risks and benefits of the procedure with the patient. This includes bleeding, bruising, infection, excessive weakness, or medication side effects. A REMS form is on file and signed. Needle:  needle electrode Number of units per muscle Biceps 500 units FDP 100 units FDS 100 units FCR 200 units FPL 100 units All injections were done after obtaining appropriate EMG activity and after negative drawback for blood. The patient tolerated the procedure well. Post procedure instructions were given. A followup appointment was made.

## 2021-02-06 ENCOUNTER — Encounter: Payer: Self-pay | Admitting: Physical Therapy

## 2021-02-06 ENCOUNTER — Ambulatory Visit: Payer: Managed Care, Other (non HMO) | Admitting: Occupational Therapy

## 2021-02-06 ENCOUNTER — Encounter: Payer: Self-pay | Admitting: Occupational Therapy

## 2021-02-06 ENCOUNTER — Other Ambulatory Visit: Payer: Self-pay

## 2021-02-06 ENCOUNTER — Ambulatory Visit: Payer: Managed Care, Other (non HMO) | Admitting: Physical Therapy

## 2021-02-06 DIAGNOSIS — M6281 Muscle weakness (generalized): Secondary | ICD-10-CM | POA: Diagnosis not present

## 2021-02-06 DIAGNOSIS — R208 Other disturbances of skin sensation: Secondary | ICD-10-CM

## 2021-02-06 DIAGNOSIS — R2689 Other abnormalities of gait and mobility: Secondary | ICD-10-CM

## 2021-02-06 DIAGNOSIS — I69354 Hemiplegia and hemiparesis following cerebral infarction affecting left non-dominant side: Secondary | ICD-10-CM

## 2021-02-06 DIAGNOSIS — R2681 Unsteadiness on feet: Secondary | ICD-10-CM

## 2021-02-06 DIAGNOSIS — R29818 Other symptoms and signs involving the nervous system: Secondary | ICD-10-CM

## 2021-02-06 NOTE — Therapy (Signed)
[facility] [address] [place], [place], [address] Phone: [phone]   Fax:  [phone]  Physical Therapy Treatment  Patient Details  Name: Dennis Mercado MRN: [id number] Date of Birth: 06-May-1981 Referring Provider (PT): Letta Pate [name], MD   Encounter Date: 02/06/2021   PT End of Session - 02/06/21 1807     Visit Number 22    Number of Visits 29    Date for PT Re-Evaluation 03/31/21    Authorization Type VL: 60 visits PT, 30 OT    Authorization - Visit Number 21    Authorization - Number of Visits 60    PT Start Time [id number]    PT Stop Time 1744    PT Time Calculation (min) 40 min    Equipment Utilized During Treatment Gait belt    Activity Tolerance Patient tolerated treatment well    Behavior During Therapy [facility] for tasks assessed/performed             History reviewed. No pertinent past medical history.  Past Surgical History:  Procedure Laterality Date   WISDOM TOOTH EXTRACTION      There were no vitals filed for this visit.   Subjective Assessment - 02/06/21 1708     Subjective Got a formal job offer! Should be starting soon.    Pertinent History HTN    Limitations Walking    Patient Stated Goals wants to work on not walking with the cane    Currently in Pain? No/denies                               [facility] Adult PT Treatment/Exercise - 02/06/21 1710       Ambulation/Gait   Ambulation/Gait Yes    Ambulation/Gait Assistance 4: Min guard    Ambulation/Gait Assistance Details with no AD; cues for upright posture and looking ahead, incr stance time on LLE, during last 2 laps had pt perform head motions and scan his environment with one episode of min A for balance and cues to continue walking vs. stopping to look    Ambulation Distance (Feet) 345 Feet   x1   Assistive device None    Gait Pattern Step-through pattern;Decreased step length - left;Decreased stance time -  left;Decreased hip/knee flexion - left;Decreased arm swing - left    Ambulation Surface Level;Indoor      Neuro Re-ed    Neuro Re-ed Details  NMR: with yoga block between thighs and LLE staggered behind R x10 reps sit <> stands and then x10 reps mini squats. manual/verbal cues for incr weight shift to LLE. standing in // bars: lifting LLE to tap yoga block 6" for hip/knee flexion, tactile cues at hip/pelvis to prevent compensatory movement x10 reps                 Balance Exercises - 02/06/21 1734       Balance Exercises: Standing   SLS Eyes open;Solid surface    SLS Limitations with blue yoga block at 4" - tapping RLE for SLS x8 reps then performed x10 reps at 6" height - use of UE support for balance. cues for quad/glute activation on LLE during stance, modfiied SLS: with LLE on the ground and RLE on yoga block 3 x 5 head turns beginning with UE support > fingertip support    Step Over Hurdles / Cones stepping over 2 smaller orange obstacles and then two 4" black obstacles  in // bars with single UE support down and back x4 reps, performing with step to pattern and leading with LLE for incr hip/knee flexion and for SLS                 PT Short Term Goals - 01/31/21 1300       PT SHORT TERM GOAL #1   Title Pt will undergo further assessment of gait speed with no AD with LTG written. ALL STGS DUE 02/28/21    Baseline not yet assessed.    Time 4    Period Weeks    Status New    Target Date 02/28/21      PT SHORT TERM GOAL #2   Title Pt will decr TUG time with no AD to 29 seconds or less in order to demo decr fall risk.    Baseline 32.44 seconds    Time 4    Period Weeks    Status New      PT SHORT TERM GOAL #3   Title Pt will ambulate at least 230' on indoor level surfaces with supervision with no AD in order to demo improved household mobility.    Time 4    Period Weeks    Status New               PT Long Term Goals - 01/31/21 1303       PT LONG TERM  GOAL #1   Title Pt and pt's spouse will be independent with final HEP in order to build upon functional gains made in therapy. ALL LTGS DUE 03/28/21    Baseline pt reports independence with HEP, will continue to benefit from progressive HEP    Time 8    Period Weeks    Status On-going    Target Date 03/28/21      PT LONG TERM GOAL #2   Title Pt will perform 4 steps with cane and no handrail with supervision for improved community access.    Time 8    Period Weeks    Status New      PT LONG TERM GOAL #3   Title Pt will perform TUG in 25 seconds or less with no AD in order to demo decr fall risk.    Baseline 32.44 seconds no AD    Time 8    Period Weeks    Status Revised      PT LONG TERM GOAL #5   Title Gait speed goal to be written with no AD.    Baseline not yet assessed with no AD    Time 8    Period Weeks    Status Revised                   Plan - 02/06/21 1809     Clinical Impression Statement Performed gait without AD at start of session with scanning environment and head motions with min guard and one episode of min A. Pt needing cues to look ahead vs. down at the ground. Remainder of session focused on LLE>RLE strengthening, LLE hip/knee flexion, and SLS activities focusing on LLE.Pt tolerated session well, will continue to progress towards LTGs.    Personal Factors and Comorbidities Comorbidity 1;Past/Current Experience;Profession    Comorbidities HTN    Examination-Activity Limitations Stand;Stairs;Squat;Transfers;Locomotion Level    Examination-Participation Restrictions Community Activity;Occupation   walking his dog   Stability/Clinical Decision Making Stable/Uncomplicated    Rehab Potential Good    PT Frequency 1x / week  PT Duration 8 weeks   6-8 weeks   PT Treatment/Interventions ADLs/Self Care Home Management;Aquatic Therapy;Electrical Stimulation;DME Instruction;Gait training;Stair training;Functional mobility training;Therapeutic  activities;Therapeutic exercise;Balance training;Neuromuscular re-education;Wheelchair mobility training;Orthotic Fit/Training;Patient/family education;Passive range of motion;Energy conservation;Vestibular    PT Next Visit Plan begin with gait with no AD before pt gets too fatigued! Continue SciFit. tall kneel and half kneeling, balance strategies on different surfaces/decr UE support, NMR/strengthening for LLE and SLS. can continue treadmill training for BWS.    PT Home Exercise Plan [id number]    Consulted and Agree with Plan of Care Patient             Patient will benefit from skilled therapeutic intervention in order to improve the following deficits and impairments:  Abnormal gait, Decreased activity tolerance, Decreased balance, Decreased mobility, Decreased knowledge of use of DME, Decreased coordination, Decreased range of motion, Decreased strength, Difficulty walking, Impaired tone, Impaired sensation, Postural dysfunction  Visit Diagnosis: Muscle weakness (generalized)  Other abnormalities of gait and mobility  Unsteadiness on feet  Hemiplegia and hemiparesis following cerebral infarction affecting left non-dominant side [facility])     Problem List Patient Active Problem List   Diagnosis Date Noted   Elevated glucose 10/13/2020   Hyperlipidemia associated with type 2 diabetes mellitus ([place]) 10/05/2020   History of CVA (cerebrovascular accident) 01/19/2020   Normocytic anemia    Benign essential HTN    Hyponatremia    Acute blood loss anemia    Hypotension due to drugs    Vascular headache    Prerenal azotemia    Sleep disturbance    Leukocytosis    Transaminitis    Marijuana abuse    Essential hypertension    Dysphagia, post-stroke    Left hemiparesis (HCC)    Acute respiratory failure (HCC)    ICH (intracerebral hemorrhage) ([place]) 12/06/2019    Arliss Journey, PT, DPT  02/06/2021, 6:10 PM  [facility] [address] [place], [place], [address] Phone: [phone]   Fax:  [phone]  Name: Pollux Eye MRN: [id number] Date of Birth: 08/15/1980

## 2021-02-06 NOTE — Therapy (Signed)
[facility] [address] [place], [place], [address] Phone: [phone]   Fax:  [phone]  Occupational Therapy Treatment  Patient Details  Name: Dennis Mercado MRN: [id number] Date of Birth: 03-21-1981 No data recorded  Encounter Date: 02/06/2021   OT End of Session - 02/06/21 1847     Visit Number 18    Number of Visits 28    Date for OT Re-Evaluation 02/18/21    Authorization Type recertify x 12 weeks    Authorization Time Period per patient 30 visit OT limit    Authorization - Visit Number 18    OT Start Time [id number]    OT Stop Time [id number]    OT Time Calculation (min) 45 min             History reviewed. No pertinent past medical history.  Past Surgical History:  Procedure Laterality Date   WISDOM TOOTH EXTRACTION      There were no vitals filed for this visit.   Subjective Assessment - 02/06/21 1835     Subjective  I got Botox on Tuesday.    Pertinent History Pt. presented to ED on 12/06/2019 with left side weakness and  altered mental status s/p coitus.  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 Pt required intubation 6/22 due to ARF secondary to aspiration PNA. Received inpatient rehab. Pt was discharged 7/28/2    Limitations HTN, left inattention, apraxia    Patient Stated Goals to be able to use my hand    Currently in Pain? No/denies    Pain Score 0-No pain                          OT Treatments/Exercises (OP) - 02/06/21 0001       Neurological Re-education Exercises   Other Exercises 1 Neuromuscular reeducation to address postural control in sitting and standing.  Patient with trunk shortening on left side consistently.  With facilitation able to shift weight onto left leg during walking, and then trunk more symmetrical.  Took photos against posture grid to highlight to patient - able to ID  asymmetries.  Worked on modified plantigrade to accept weight onto long arm LUE.  With cueing and min facil able to weight shift left sufficiently to pick up RLE.      Splinting   Splinting Discussed splinting with patient.  He has prefabricated resting hand splint.  He is considering purchasing $200 splint for left hand.  Discussed making custom splnt for improving range in hands.                      OT Short Term Goals - 02/06/21 1850       OT SHORT TERM GOAL #1   Title I with inital HEP    Time 4    Period Weeks    Status Achieved      OT SHORT TERM GOAL #2   Title Patient will demonstrate low reach to 30 degrees of shoulder flexion with min assist    Time 4    Period Weeks    Status Achieved      OT SHORT TERM GOAL #3   Title Patient will demonstrate 30 degrees of active assited wrist and forearm motion    Time 4    Period Weeks    Status Achieved      OT  SHORT TERM GOAL #4   Title Patient will walk 20 feet and carry lightweight object using his left arm.    Time 6    Period Weeks    Status On-going    Target Date 01/04/21      OT SHORT TERM GOAL #5   Title Patient will transport himself to aquatic therapy without assistance    Time 6    Period Weeks    Status On-going    Target Date 01/04/21               OT Long Term Goals - 02/06/21 1850       OT LONG TERM GOAL #1   Title I with aquatic HEP    Time 8    Period Weeks    Status On-going      OT LONG TERM GOAL #2   Title Patient will demonstrate low reach to 45 degrees of shoulder flexion with elbow extension    Time 8    Period Weeks    Status Achieved      OT LONG TERM GOAL #3   Title Patient will demonstrate no pain with passive range of motion to 120 shoulder flexion/abduction    Time 8    Period Weeks    Status Achieved      OT LONG TERM GOAL #4   Title Patient will incorporate self stretching techniques throughout the day whether at home or work.    Time 12    Period  Weeks    Status On-going      OT LONG TERM GOAL #5   Title Patient will reach to hit target at at least 70 degrees of shoulder flexion while upright    Time 12    Period Weeks    Status On-going      OT LONG TERM GOAL #6   Title xxx                   Plan - 02/06/21 1848     Clinical Impression Statement Patient just received Botox in LUE last week.  Patient will start working soon, and is trying to coordinate his therapy appointments with his new work schedule.  Will modify schedule to see every other week.    OT Occupational Profile and History Detailed Assessment- Review of Records and additional review of physical, cognitive, psychosocial history related to current functional performance    Occupational performance deficits (Please refer to evaluation for details): ADL's;IADL's;Leisure;Social Participation;Work;Play    Body Structure / Function / Physical Skills ADL;Balance;Endurance;Mobility;Strength;UE functional use;FMC;Vision;Coordination;Gait;Decreased knowledge of precautions;GMC;ROM;Decreased knowledge of use of DME;Dexterity;IADL;Sensation;Tone;Body Banker;Safety Awareness;Thought;Understand    Rehab Potential Good    Clinical Decision Making Limited treatment options, no task modification necessary    Comorbidities Affecting Occupational Performance: May have comorbidities impacting occupational performance    Modification or Assistance to Complete Evaluation  No modification of tasks or assist necessary to complete eval    OT Frequency 1x / week    OT Duration 12 weeks    OT Treatment/Interventions Self-care/ADL training;Therapeutic exercise;Functional Mobility Training;Balance training;Splinting;Manual Therapy;Neuromuscular education;Aquatic Therapy;Ultrasound;Energy conservation;Therapeutic activities;Cryotherapy;Paraffin;DME and/or AE instruction;Cognitive remediation/compensation;Visual/perceptual  remediation/compensation;Gait Training;Fluidtherapy;Electrical Stimulation;Moist Heat;Contrast Bath;Passive range of motion;Patient/family education    Plan aquatic therapy and clinic therapy to address spasticity, need left trunk lengthening/right trunk shortening, realignment of shoulder girdle, and isolated glenohumeral joint motion    Consulted and Agree with Plan of Care Patient  Patient will benefit from skilled therapeutic intervention in order to improve the following deficits and impairments:   Body Structure / Function / Physical Skills: ADL, Balance, Endurance, Mobility, Strength, UE functional use, FMC, Vision, Coordination, Gait, Decreased knowledge of precautions, GMC, ROM, Decreased knowledge of use of DME, Dexterity, IADL, Sensation, Tone, Body mechanics Cognitive Skills: Attention, Memory, Problem Solve, Safety Awareness, Thought, Understand     Visit Diagnosis: Hemiplegia and hemiparesis following cerebral infarction affecting left non-dominant side (HCC)  Other disturbances of skin sensation  Other symptoms and signs involving the nervous system  Unsteadiness on feet  Muscle weakness (generalized)    Problem List Patient Active Problem List   Diagnosis Date Noted   Elevated glucose 10/13/2020   Hyperlipidemia associated with type 2 diabetes mellitus ([place]) 10/05/2020   History of CVA (cerebrovascular accident) 01/19/2020   Normocytic anemia    Benign essential HTN    Hyponatremia    Acute blood loss anemia    Hypotension due to drugs    Vascular headache    Prerenal azotemia    Sleep disturbance    Leukocytosis    Transaminitis    Marijuana abuse    Essential hypertension    Dysphagia, post-stroke    Left hemiparesis (HCC)    Acute respiratory failure (HCC)    ICH (intracerebral hemorrhage) ([place]) 12/06/2019    Mariah Milling 02/06/2021, 6:51 PM  [facility] [address]  [place], [place], [address] Phone: [phone]   Fax:  [phone]  Name: Dennis Mercado MRN: [id number] Date of Birth: 1981-03-23

## 2021-02-13 ENCOUNTER — Ambulatory Visit: Payer: Managed Care, Other (non HMO) | Admitting: Physical Therapy

## 2021-02-13 ENCOUNTER — Encounter: Payer: Self-pay | Admitting: Physical Therapy

## 2021-02-13 ENCOUNTER — Other Ambulatory Visit: Payer: Self-pay

## 2021-02-13 DIAGNOSIS — R29818 Other symptoms and signs involving the nervous system: Secondary | ICD-10-CM

## 2021-02-13 DIAGNOSIS — M6281 Muscle weakness (generalized): Secondary | ICD-10-CM

## 2021-02-13 DIAGNOSIS — I69354 Hemiplegia and hemiparesis following cerebral infarction affecting left non-dominant side: Secondary | ICD-10-CM

## 2021-02-13 DIAGNOSIS — R2681 Unsteadiness on feet: Secondary | ICD-10-CM

## 2021-02-13 NOTE — Therapy (Signed)
[facility] [address] [place], [place], [address] Phone: [phone]   Fax:  [phone]  Physical Therapy Treatment  Patient Details  Name: Dennis Mercado MRN: [id number] Date of Birth: 10/12/1980 Referring Provider (PT): [name], MD   Encounter Date: 02/13/2021   PT End of Session - 02/13/21 2003     Visit Number 23    Number of Visits 29    Date for PT Re-Evaluation 03/31/21    Authorization Type VL: 10 visits PT, 30 OT    Authorization - Visit Number 22    Authorization - Number of Visits 60    PT Start Time 1701    PT Stop Time [id number]    PT Time Calculation (min) 42 min    Equipment Utilized During Treatment Gait belt    Activity Tolerance Patient tolerated treatment well    Behavior During Therapy [facility] for tasks assessed/performed             History reviewed. No pertinent past medical history.  Past Surgical History:  Procedure Laterality Date   WISDOM TOOTH EXTRACTION      There were no vitals filed for this visit.   Subjective Assessment - 02/13/21 1706     Subjective Got a new job and will be starting the beginning of October.    Pertinent History HTN    Limitations Walking    Patient Stated Goals wants to work on not walking with the cane    Currently in Pain? No/denies                               [facility] Adult PT Treatment/Exercise - 02/13/21 1708       Ambulation/Gait   Ambulation/Gait Yes    Ambulation/Gait Assistance 5: Supervision    Ambulation/Gait Assistance Details between activities    Ambulation Distance (Feet) --   between activities   Assistive device Straight cane   with 3 prong tip   Gait Pattern Step-through pattern;Decreased step length - left;Decreased stance time - left;Decreased hip/knee flexion - left;Decreased arm swing - left    Ambulation Surface Level;Indoor      Neuro Re-ed    Neuro Re-ed Details  NMR: tall kneeling on mat  with kaye bench for RUE, used mirror for visual feedback for midline, x10 reps tall kneel mini squats, an additional x10 reps with PT providing black tband resistance at pt's pelvis, with RLE as stance leg bringing LLE into hip extension and then flexion on mat table x12 reps, intermittent assist into extension, then with LLE as stance leg bringing RLE into hip flexion and then back to midline x5 reps, assist for balance and for weight shift to L      Knee/Hip Exercises: Aerobic   Other Aerobic Completed SciFit with BLE x 6 miuntes on Level 2.5 for strengthening and ROM. after discussed use of a seated stepper such as a cubii for pt to use when he starts his job (will be working from home), pt will be looking into getting one                 Balance Exercises - 02/13/21 1741       Balance Exercises: Standing   Standing Eyes Opened Foam/compliant surface    Standing Eyes Opened Limitations use of mirror for visual cue for midline: feet hip width distance x10 reps head turns, x10 reps head nods, intermittent taps  to bars for balance    Sidestepping 3 reps;Foam/compliant support;Limitations    Sidestepping Limitations down and back 3 reps, using single UE support for balance                 PT Short Term Goals - 01/31/21 1300       PT SHORT TERM GOAL #1   Title Pt will undergo further assessment of gait speed with no AD with LTG written. ALL STGS DUE 02/28/21    Baseline not yet assessed.    Time 4    Period Weeks    Status New    Target Date 02/28/21      PT SHORT TERM GOAL #2   Title Pt will decr TUG time with no AD to 29 seconds or less in order to demo decr fall risk.    Baseline 32.44 seconds    Time 4    Period Weeks    Status New      PT SHORT TERM GOAL #3   Title Pt will ambulate at least 230' on indoor level surfaces with supervision with no AD in order to demo improved household mobility.    Time 4    Period Weeks    Status New               PT  Long Term Goals - 01/31/21 1303       PT LONG TERM GOAL #1   Title Pt and pt's spouse will be independent with final HEP in order to build upon functional gains made in therapy. ALL LTGS DUE 03/28/21    Baseline pt reports independence with HEP, will continue to benefit from progressive HEP    Time 8    Period Weeks    Status On-going    Target Date 03/28/21      PT LONG TERM GOAL #2   Title Pt will perform 4 steps with cane and no handrail with supervision for improved community access.    Time 8    Period Weeks    Status New      PT LONG TERM GOAL #3   Title Pt will perform TUG in 25 seconds or less with no AD in order to demo decr fall risk.    Baseline 32.44 seconds no AD    Time 8    Period Weeks    Status Revised      PT LONG TERM GOAL #5   Title Gait speed goal to be written with no AD.    Baseline not yet assessed with no AD    Time 8    Period Weeks    Status Revised                   Plan - 02/13/21 2009     Clinical Impression Statement Today's skilled session focused on proximal hip strengthening and LLE weight bearing/midline positioning in tall kneeling,BLE strength/ROM, and balance on compliant surfaces. Pt fatiguing with tall kneeling activities, but tolerated sesion well. Did need intermittent UE support on bars for balance on unlevel surfaces. Will continue to progress towards LTGs.    Personal Factors and Comorbidities Comorbidity 1;Past/Current Experience;Profession    Comorbidities HTN    Examination-Activity Limitations Stand;Stairs;Squat;Transfers;Locomotion Level    Examination-Participation Restrictions Community Activity;Occupation   walking his dog   Stability/Clinical Decision Making Stable/Uncomplicated    Rehab Potential Good    PT Frequency 1x / week    PT Duration 8 weeks   6-8  weeks   PT Treatment/Interventions ADLs/Self Care Home Management;Aquatic Therapy;Electrical Stimulation;DME Instruction;Gait training;Stair  training;Functional mobility training;Therapeutic activities;Therapeutic exercise;Balance training;Neuromuscular re-education;Wheelchair mobility training;Orthotic Fit/Training;Patient/family education;Passive range of motion;Energy conservation;Vestibular    PT Next Visit Plan begin with gait with no AD before pt gets too fatigued! Continue SciFit. tall kneel and half kneeling, balance strategies on different surfaces/decr UE support, NMR/strengthening for LLE and SLS. can continue treadmill training for BWS.    PT Home Exercise Plan [id number]    Consulted and Agree with Plan of Care Patient             Patient will benefit from skilled therapeutic intervention in order to improve the following deficits and impairments:  Abnormal gait, Decreased activity tolerance, Decreased balance, Decreased mobility, Decreased knowledge of use of DME, Decreased coordination, Decreased range of motion, Decreased strength, Difficulty walking, Impaired tone, Impaired sensation, Postural dysfunction  Visit Diagnosis: Hemiplegia and hemiparesis following cerebral infarction affecting left non-dominant side (HCC)  Other symptoms and signs involving the nervous system  Unsteadiness on feet  Muscle weakness (generalized)     Problem List Patient Active Problem List   Diagnosis Date Noted   Elevated glucose 10/13/2020   Hyperlipidemia associated with type 2 diabetes mellitus ([place]) 10/05/2020   History of CVA (cerebrovascular accident) 01/19/2020   Normocytic anemia    Benign essential HTN    Hyponatremia    Acute blood loss anemia    Hypotension due to drugs    Vascular headache    Prerenal azotemia    Sleep disturbance    Leukocytosis    Transaminitis    Marijuana abuse    Essential hypertension    Dysphagia, post-stroke    Left hemiparesis (HCC)    Acute respiratory failure (HCC)    ICH (intracerebral hemorrhage) ([place]) 12/06/2019    Arliss Journey, PT, DPT  02/13/2021, 8:10 PM  [facility] [address] [place], [place], [address] Phone: [phone]   Fax:  [phone]  Name: Dennis Mercado MRN: [id number] Date of Birth: 1980/08/19

## 2021-02-20 ENCOUNTER — Encounter: Payer: Self-pay | Admitting: Physical Therapy

## 2021-02-20 ENCOUNTER — Encounter: Payer: Self-pay | Admitting: Occupational Therapy

## 2021-02-20 ENCOUNTER — Ambulatory Visit: Payer: Managed Care, Other (non HMO) | Attending: Physician Assistant | Admitting: Physical Therapy

## 2021-02-20 ENCOUNTER — Other Ambulatory Visit: Payer: Self-pay

## 2021-02-20 ENCOUNTER — Ambulatory Visit: Payer: Managed Care, Other (non HMO) | Admitting: Occupational Therapy

## 2021-02-20 DIAGNOSIS — R29818 Other symptoms and signs involving the nervous system: Secondary | ICD-10-CM | POA: Diagnosis present

## 2021-02-20 DIAGNOSIS — M6281 Muscle weakness (generalized): Secondary | ICD-10-CM | POA: Diagnosis present

## 2021-02-20 DIAGNOSIS — R2681 Unsteadiness on feet: Secondary | ICD-10-CM

## 2021-02-20 DIAGNOSIS — R208 Other disturbances of skin sensation: Secondary | ICD-10-CM | POA: Insufficient documentation

## 2021-02-20 DIAGNOSIS — R2689 Other abnormalities of gait and mobility: Secondary | ICD-10-CM | POA: Insufficient documentation

## 2021-02-20 DIAGNOSIS — I69354 Hemiplegia and hemiparesis following cerebral infarction affecting left non-dominant side: Secondary | ICD-10-CM

## 2021-02-20 NOTE — Therapy (Signed)
[facility] [address] [place], [place], [address] Phone: [phone]   Fax:  [phone]  Occupational Therapy Treatment  Patient Details  Name: Dennis Mercado MRN: [id number] Date of Birth: 04/10/1981 No data recorded  Encounter Date: 02/20/2021   OT End of Session - 02/20/21 1808     Authorization Type recertify for remaining 9 visits - every other week schedule    Authorization Time Period per patient 30 visit OT limit    Authorization - Visit Number 19    Authorization - Number of Visits 18    Progress Note Due on Visit 20    OT Start Time 1615    OT Stop Time 1700    OT Time Calculation (min) 45 min    Activity Tolerance Patient tolerated treatment well    Behavior During Therapy [facility] for tasks assessed/performed             History reviewed. No pertinent past medical history.  Past Surgical History:  Procedure Laterality Date   WISDOM TOOTH EXTRACTION      There were no vitals filed for this visit.   Subjective Assessment - 02/20/21 1802     Subjective  Patient excited to start job in early October    Pertinent History Pt. presented to ED on 12/06/2019 with left side weakness and  altered mental status s/p coitus.  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 Pt required intubation 6/22 due to ARF secondary to aspiration PNA. Received inpatient rehab. Pt was discharged 7/28/2    Limitations HTN, left inattention, apraxia    Patient Stated Goals to be able to use my hand    Currently in Pain? No/denies    Pain Score 0-No pain                          OT Treatments/Exercises (OP) - 02/20/21 0001       Neurological Re-education Exercises   Other Exercises 1 Worked on creating form to aide patient with weight bearing through long arm LUE.  Patient does get into 4 point at home and it is challenging  to get weight transferred through left hand - which ultimately helps fingers to stretch and reduces muscle tension.  Custom form made from thermoplastic, and patient will try at home and modifications will be made as needed.    Other Exercises 2 4 point to side sit - still challenging for patient to work through gross motor rotational movements.  Difficulty with motor planning, and weight shifting onto left side.                  Upper Extremity Functional Index Score :   /80     OT Short Term Goals - 02/20/21 1812       OT SHORT TERM GOAL #1   Title I with inital HEP    Time 4    Period Weeks    Status Achieved      OT SHORT TERM GOAL #2   Title Patient will demonstrate low reach to 30 degrees of shoulder flexion with min assist    Time 4    Period Weeks    Status Achieved      OT SHORT TERM GOAL #3   Title Patient will demonstrate 30 degrees of active assited wrist and forearm motion    Time 4  Period Weeks    Status Achieved      OT SHORT TERM GOAL #4   Title Patient will walk 20 feet and carry lightweight object using his left arm.    Time 6    Period Weeks    Status On-going    Target Date 01/04/21      OT SHORT TERM GOAL #5   Title Patient will transport himself to aquatic therapy without assistance    Time 6    Period Weeks    Status Partially Met    Target Date 01/04/21               OT Long Term Goals - 02/20/21 1812       OT LONG TERM GOAL #1   Title I with aquatic HEP    Time 8    Period Weeks    Status On-going      OT LONG TERM GOAL #2   Title Patient will demonstrate low reach to 45 degrees of shoulder flexion with elbow extension    Time 8    Period Weeks    Status Achieved      OT LONG TERM GOAL #3   Title Patient will demonstrate no pain with passive range of motion to 120 shoulder flexion/abduction    Time 8    Period Weeks    Status Achieved      OT LONG TERM GOAL #4   Title Patient will incorporate self stretching  techniques throughout the day whether at home or work.    Time 12    Period Weeks    Status Achieved      OT LONG TERM GOAL #5   Title Patient will reach to hit target at at least 70 degrees of shoulder flexion while upright    Time 12    Period Weeks    Status On-going      OT LONG TERM GOAL #6   Title xxx                   Plan - 02/20/21 1810     Clinical Impression Statement Patient continues to benefit from OT, so plan to extend this plan of care to accomplish remaining OT visits.    OT Occupational Profile and History Detailed Assessment- Review of Records and additional review of physical, cognitive, psychosocial history related to current functional performance    Occupational performance deficits (Please refer to evaluation for details): ADL's;IADL's;Leisure;Social Participation;Work;Play    Body Structure / Function / Physical Skills ADL;Balance;Endurance;Mobility;Strength;UE functional use;FMC;Vision;Coordination;Gait;Decreased knowledge of precautions;GMC;ROM;Decreased knowledge of use of DME;Dexterity;IADL;Sensation;Tone;Body Banker;Safety Awareness;Thought;Understand    Rehab Potential Good    Clinical Decision Making Limited treatment options, no task modification necessary    Comorbidities Affecting Occupational Performance: May have comorbidities impacting occupational performance    Modification or Assistance to Complete Evaluation  No modification of tasks or assist necessary to complete eval    OT Frequency Other (comment)   9 more visits   OT Duration 12 weeks    OT Treatment/Interventions Self-care/ADL training;Therapeutic exercise;Functional Mobility Training;Balance training;Splinting;Manual Therapy;Neuromuscular education;Aquatic Therapy;Ultrasound;Energy conservation;Therapeutic activities;Cryotherapy;Paraffin;DME and/or AE instruction;Cognitive remediation/compensation;Visual/perceptual  remediation/compensation;Gait Training;Fluidtherapy;Electrical Stimulation;Moist Heat;Contrast Bath;Passive range of motion;Patient/family education    Plan aquatic therapy and clinic therapy to address spasticity, need left trunk lengthening/right trunk shortening, realignment of shoulder girdle, and isolated glenohumeral joint motion    Consulted and Agree with Plan of Care Patient  Patient will benefit from skilled therapeutic intervention in order to improve the following deficits and impairments:   Body Structure / Function / Physical Skills: ADL, Balance, Endurance, Mobility, Strength, UE functional use, FMC, Vision, Coordination, Gait, Decreased knowledge of precautions, GMC, ROM, Decreased knowledge of use of DME, Dexterity, IADL, Sensation, Tone, Body mechanics Cognitive Skills: Attention, Memory, Problem Solve, Safety Awareness, Thought, Understand     Visit Diagnosis: Hemiplegia and hemiparesis following cerebral infarction affecting left non-dominant side (HCC)  Unsteadiness on feet  Muscle weakness (generalized)  Other disturbances of skin sensation  Other symptoms and signs involving the nervous system    Problem List Patient Active Problem List   Diagnosis Date Noted   Elevated glucose 10/13/2020   Hyperlipidemia associated with type 2 diabetes mellitus ([place]) 10/05/2020   History of CVA (cerebrovascular accident) 01/19/2020   Normocytic anemia    Benign essential HTN    Hyponatremia    Acute blood loss anemia    Hypotension due to drugs    Vascular headache    Prerenal azotemia    Sleep disturbance    Leukocytosis    Transaminitis    Marijuana abuse    Essential hypertension    Dysphagia, post-stroke    Left hemiparesis (HCC)    Acute respiratory failure (HCC)    ICH (intracerebral hemorrhage) ([place]) 12/06/2019    Mariah Milling, OT 02/20/2021, 6:15 PM  [facility] [address]  [place], [place], [address] Phone: [phone]   Fax:  [phone]  Name: Dennis Mercado MRN: [id number] Date of Birth: [birth year]

## 2021-02-20 NOTE — Therapy (Addendum)
[facility] [address] [place], [place], [address] Phone: [phone]   Fax:  [phone]  Physical Therapy Treatment  Patient Details  Name: Dennis Mercado MRN: [id number] Date of Birth: 26-Nov-1980 Referring Provider (PT): [name], MD   Encounter Date: 02/20/2021   PT End of Session - 02/20/21 2128     Visit Number 24    Number of Visits 29    Date for PT Re-Evaluation 03/31/21    Authorization Type VL: 71 visits PT, 30 OT    Authorization - Visit Number 23    Authorization - Number of Visits 60    PT Start Time [id number]    PT Stop Time [id number]    PT Time Calculation (min) 41 min    Equipment Utilized During Treatment Gait belt    Activity Tolerance Patient tolerated treatment well    Behavior During Therapy [facility] for tasks assessed/performed             History reviewed. No pertinent past medical history.  Past Surgical History:  Procedure Laterality Date   WISDOM TOOTH EXTRACTION      There were no vitals filed for this visit.   Subjective Assessment - 02/20/21 1536     Subjective Got the cubii over the weekend - its on the way.    Pertinent History HTN    Limitations Walking    Patient Stated Goals wants to work on not walking with the cane    Currently in Pain? No/denies                               [facility] Adult PT Treatment/Exercise - 02/20/21 1538       Ambulation/Gait   Ambulation/Gait Yes    Ambulation/Gait Assistance 5: Supervision    Ambulation/Gait Assistance Details with no AD at start of session, pt able to ambulate with supervision, but min A provided at pt's pelvis for incr weight shift to LLE, cues for posture. pt with decr L knee flexion during gait    Ambulation Distance (Feet) 345 Feet    Assistive device None    Gait Pattern Step-through pattern;Decreased step length - left;Decreased stance time - left;Decreased hip/knee flexion - left;Decreased arm  swing - left    Ambulation Surface Level;Indoor      Neuro Re-ed    Neuro Re-ed Details  NMR: on floor on red mat and RUE support as needed on mat table for balance, half kneel with RLE anteriorly; holding balance 3 x 15 seconds, mini lunges x10 reps, head turns 2 x 5 reps with intermittent taps to mat for balance. with LLE forwards holding balance 3 x 15 seocnds, 2 x 5 reps head turns, transition from half kneel to tall kneel x2 reps (needing min A to assist with weight shift to LLE and RUE support needed on mat table), cues for posture and glute/core activation throughout. Min guard/min A for balance                Balance Exercises - 02/20/21 0001       Balance Exercises: Standing   Standing Eyes Closed Narrow base of support (BOS);Foam/compliant surface    Standing Eyes Closed Limitations on blue air ex, slight space between feet 3 x 25 seconds, intermittent taps to wall/chair for balance    Stepping Strategy Anterior;Limitations    Stepping Strategy Limitations with LLE on blue air ex, stepping RLE off  anteriorly and back on for quad strengthening and incr weight shift to LLE x10 reps, needing RUE support    Partial Tandem Stance Foam/compliant surface;Eyes open;Eyes closed;Limitations    Partial Tandem Stance Limitations on blue air ex with LLE posteriorly, 4 x 20 seconds                PT Short Term Goals - 01/31/21 1300       PT SHORT TERM GOAL #1   Title Pt will undergo further assessment of gait speed with no AD with LTG written. ALL STGS DUE 02/28/21    Baseline not yet assessed.    Time 4    Period Weeks    Status New    Target Date 02/28/21      PT SHORT TERM GOAL #2   Title Pt will decr TUG time with no AD to 29 seconds or less in order to demo decr fall risk.    Baseline 32.44 seconds    Time 4    Period Weeks    Status New      PT SHORT TERM GOAL #3   Title Pt will ambulate at least 230' on indoor level surfaces with supervision with no AD in order  to demo improved household mobility.    Time 4    Period Weeks    Status New               PT Long Term Goals - 01/31/21 1303       PT LONG TERM GOAL #1   Title Pt and pt's spouse will be independent with final HEP in order to build upon functional gains made in therapy. ALL LTGS DUE 03/28/21    Baseline pt reports independence with HEP, will continue to benefit from progressive HEP    Time 8    Period Weeks    Status On-going    Target Date 03/28/21      PT LONG TERM GOAL #2   Title Pt will perform 4 steps with cane and no handrail with supervision for improved community access.    Time 8    Period Weeks    Status New      PT LONG TERM GOAL #3   Title Pt will perform TUG in 25 seconds or less with no AD in order to demo decr fall risk.    Baseline 32.44 seconds no AD    Time 8    Period Weeks    Status Revised      PT LONG TERM GOAL #5   Title Gait speed goal to be written with no AD.    Baseline not yet assessed with no AD    Time 8    Period Weeks    Status Revised                   Plan - 02/20/21 2131     Clinical Impression Statement Continued to work on gait with no AD with cues for incr weight shift to LLE. Remainder of session focused on balance on compliant surfaces with vision removed/narrow BOS and half kneeling for balance and proximal hip strengthening. Pt challenged with balance with eyes closed on compliant surfaces. Pt tolerated session well, will continue to progress towards LTGs.    Personal Factors and Comorbidities Comorbidity 1;Past/Current Experience;Profession    Comorbidities HTN    Examination-Activity Limitations Stand;Stairs;Squat;Transfers;Locomotion Level    Examination-Participation Restrictions Community Activity;Occupation   walking his dog   Stability/Clinical Decision  Making Stable/Uncomplicated    Rehab Potential Good    PT Frequency 1x / week    PT Duration 8 weeks   6-8 weeks   PT Treatment/Interventions ADLs/Self  Care Home Management;Aquatic Therapy;Electrical Stimulation;DME Instruction;Gait training;Stair training;Functional mobility training;Therapeutic activities;Therapeutic exercise;Balance training;Neuromuscular re-education;Wheelchair mobility training;Orthotic Fit/Training;Patient/family education;Passive range of motion;Energy conservation;Vestibular    PT Next Visit Plan gait with no AD. Continue SciFit. tall kneel and half kneeling, balance strategies on different surfaces/decr UE support and eyes closed. NMR/strengthening for LLE and SLS.    PT Home Exercise Plan [id number]    Consulted and Agree with Plan of Care Patient             Patient will benefit from skilled therapeutic intervention in order to improve the following deficits and impairments:  Abnormal gait, Decreased activity tolerance, Decreased balance, Decreased mobility, Decreased knowledge of use of DME, Decreased coordination, Decreased range of motion, Decreased strength, Difficulty walking, Impaired tone, Impaired sensation, Postural dysfunction  Visit Diagnosis: Hemiplegia and hemiparesis following cerebral infarction affecting left non-dominant side (HCC)  Unsteadiness on feet  Other symptoms and signs involving the nervous system  Muscle weakness (generalized)     Problem List Patient Active Problem List   Diagnosis Date Noted   Elevated glucose 10/13/2020   Hyperlipidemia associated with type 2 diabetes mellitus ([place]) 10/05/2020   History of CVA (cerebrovascular accident) 01/19/2020   Normocytic anemia    Benign essential HTN    Hyponatremia    Acute blood loss anemia    Hypotension due to drugs    Vascular headache    Prerenal azotemia    Sleep disturbance    Leukocytosis    Transaminitis    Marijuana abuse    Essential hypertension    Dysphagia, post-stroke    Left hemiparesis ([place])    Acute respiratory failure (HCC)    ICH (intracerebral hemorrhage) ([place]) 12/06/2019    Arliss Journey, PT,  DPT 02/20/2021, 9:36 PM  [facility] [address] [place], [place], [address] Phone: [phone]   Fax:  [phone]  Name: Dennis Mercado MRN: [id number] Date of Birth: 1981/04/07

## 2021-02-27 ENCOUNTER — Encounter: Payer: Self-pay | Admitting: Physical Therapy

## 2021-02-27 ENCOUNTER — Other Ambulatory Visit: Payer: Self-pay

## 2021-02-27 ENCOUNTER — Ambulatory Visit: Payer: Managed Care, Other (non HMO) | Admitting: Physical Therapy

## 2021-02-27 DIAGNOSIS — I69354 Hemiplegia and hemiparesis following cerebral infarction affecting left non-dominant side: Secondary | ICD-10-CM | POA: Diagnosis not present

## 2021-02-27 DIAGNOSIS — R2681 Unsteadiness on feet: Secondary | ICD-10-CM

## 2021-02-27 DIAGNOSIS — R2689 Other abnormalities of gait and mobility: Secondary | ICD-10-CM

## 2021-02-27 DIAGNOSIS — M6281 Muscle weakness (generalized): Secondary | ICD-10-CM

## 2021-02-27 NOTE — Therapy (Addendum)
[facility] [address] [place], [place], [address] Phone: [phone]   Fax:  [phone]  Physical Therapy Treatment  Patient Details  Name: Dennis Mercado MRN: [id number] Date of Birth: Oct 17, 1980 Referring Provider (PT): Letta Pate [name], MD   Encounter Date: 02/27/2021   PT End of Session - 02/27/21 1623     Visit Number 25    Number of Visits 29    Date for PT Re-Evaluation 03/31/21    Authorization Type VL: 60 visits PT, 30 OT    Authorization - Visit Number 24    Authorization - Number of Visits 60    PT Start Time [id number]    PT Stop Time 1615    PT Time Calculation (min) 42 min    Equipment Utilized During Treatment Gait belt    Activity Tolerance Patient tolerated treatment well    Behavior During Therapy [facility] for tasks assessed/performed             History reviewed. No pertinent past medical history.  Past Surgical History:  Procedure Laterality Date   WISDOM TOOTH EXTRACTION      There were no vitals filed for this visit.   Subjective Assessment - 02/27/21 1535     Subjective Got his cubii and has used it a couple of times.    Pertinent History HTN    Limitations Walking    Patient Stated Goals wants to work on not walking with the cane    Currently in Pain? No/denies                [facility] PT Assessment - 02/27/21 1544       Timed Up and Go Test   Normal TUG (seconds) 18.03   no AD                          [facility] Adult PT Treatment/Exercise - 02/27/21 1544       Ambulation/Gait   Ambulation/Gait Yes    Ambulation/Gait Assistance 5: Supervision    Ambulation/Gait Assistance Details with no AD at start of session - initial cues for incr stance time, used hurry cane between activities    Ambulation Distance (Feet) 230 Feet    Assistive device None    Gait Pattern Step-through pattern;Decreased step length - left;Decreased stance time - left;Decreased  hip/knee flexion - left;Decreased arm swing - left    Ambulation Surface Level;Indoor    Gait velocity 22.13 seconds = 1.48 ft/sec      Neuro Re-ed    Neuro Re-ed Details  NMR: staggered stance sit <> stand on blue air ex with no UE support, with RLE on bubble mini squats x10 reps- needing UE support, cues to gently place RLE on floor bubble for incr weight shift to LLE      Knee/Hip Exercises: Standing   Lateral Step Up Left;1 set;10 reps;Hand Hold: 1;Step Height: 4"    Lateral Step Up Limitations with LLE as stance leg, floating RLE, cues for posture and L knee extension    Forward Step Up Left;1 set;10 reps;Hand Hold: 1;Step Height: 6"    Forward Step Up Limitations with LLE as stance leg (pt stepping LLE on and off each time for incr hip/knee flexion), floating RLE, cues for posture and L knee extension    Step Down Left;1 set;10 reps;Hand Hold: 1;Step Height: 4"    Step Down Limitations stepping RLE down to floor and back up onto step  Balance Exercises - 02/27/21 1614       Balance Exercises: Standing   SLS Eyes open;Solid surface    SLS Limitations with LLE as stance leg for incr weight bearing shifting, placing RLE on soccer ball 3 x 15 second holds, then progressed to rolling ball forwards and back 2 x 8 reps with RLE for SLS on LLE, cues for posture and looking into mirror throughout                PT Education - 02/27/21 1623     Education Details can reach out to Dr.Kirsteins regarding any oral medication to help with spasticity in LLE, pt has appt in october    Person(s) Educated Patient    Methods Explanation    Comprehension Verbalized understanding              PT Short Term Goals - 02/27/21 1543       PT SHORT TERM GOAL #1   Title Pt will undergo further assessment of gait speed with no AD with LTG written. ALL STGS DUE 02/28/21    Baseline not yet assessed; 22.13 seconds = 1.48 ft/sec    Time 4    Period Weeks    Status  Achieved    Target Date 02/28/21      PT SHORT TERM GOAL #2   Title Pt will decr TUG time with no AD to 29 seconds or less in order to demo decr fall risk.    Baseline 32.44 seconds; 18.03 seconds with no AD on 02/27/21    Time 4    Period Weeks    Status Achieved      PT SHORT TERM GOAL #3   Title Pt will ambulate at least 230' on indoor level surfaces with supervision with no AD in order to demo improved household mobility.    Time 4    Period Weeks    Status Achieved               PT Long Term Goals - 02/27/21 1625       PT LONG TERM GOAL #1   Title Pt and pt's spouse will be independent with final HEP in order to build upon functional gains made in therapy. ALL LTGS DUE 03/28/21    Baseline pt reports independence with HEP, will continue to benefit from progressive HEP    Time 8    Period Weeks    Status On-going      PT LONG TERM GOAL #2   Title Pt will perform 4 steps with cane and no handrail with supervision for improved community access.    Time 8    Period Weeks    Status New      PT LONG TERM GOAL #3   Title Pt will perform TUG in 15 seconds or less with no AD in order to demo decr fall risk.    Baseline 32.44 seconds no AD, 18.03 seconds no AD    Time 8    Period Weeks    Status Revised      PT LONG TERM GOAL #5   Title Pt will improve gait speed with no AD to at least 1.7 ft/sec for improved gait efficiency/decr fall risk.    Baseline 1.48 ft/sec    Time 8    Period Weeks    Status Revised                   Plan - 02/27/21 1800  Clinical Impression Statement Pt has achieved 3 out of 3 STGs today. Assessed pt's gait speed with no AD at 1.48 ft/sec, indicating pt is at an incr fall risk. Pt improved TUG time to 18.03 seconds with no AD (previously 32.44 seconds). Remainder of session focused on LLE strengthening and weight bearing/SLS tasks. LLE did fatigue with step ups. Will continue to progress towards LTGs.    Personal Factors and  Comorbidities Comorbidity 1;Past/Current Experience;Profession    Comorbidities HTN    Examination-Activity Limitations Stand;Stairs;Squat;Transfers;Locomotion Level    Examination-Participation Restrictions Community Activity;Occupation   walking his dog   Stability/Clinical Decision Making Stable/Uncomplicated    Rehab Potential Good    PT Frequency 1x / week    PT Duration 8 weeks   6-8 weeks   PT Treatment/Interventions ADLs/Self Care Home Management;Aquatic Therapy;Electrical Stimulation;DME Instruction;Gait training;Stair training;Functional mobility training;Therapeutic activities;Therapeutic exercise;Balance training;Neuromuscular re-education;Wheelchair mobility training;Orthotic Fit/Training;Patient/family education;Passive range of motion;Energy conservation;Vestibular    PT Next Visit Plan gait with no AD. Continue SciFit. tall kneel and half kneeling, balance strategies on different surfaces/decr UE support and eyes closed. NMR/strengthening for LLE and SLS.    PT Home Exercise Plan [id number]    Consulted and Agree with Plan of Care Patient             Patient will benefit from skilled therapeutic intervention in order to improve the following deficits and impairments:  Abnormal gait, Decreased activity tolerance, Decreased balance, Decreased mobility, Decreased knowledge of use of DME, Decreased coordination, Decreased range of motion, Decreased strength, Difficulty walking, Impaired tone, Impaired sensation, Postural dysfunction  Visit Diagnosis: Hemiplegia and hemiparesis following cerebral infarction affecting left non-dominant side (HCC)  Unsteadiness on feet  Muscle weakness (generalized)  Other abnormalities of gait and mobility     Problem List Patient Active Problem List   Diagnosis Date Noted   Elevated glucose 10/13/2020   Hyperlipidemia associated with type 2 diabetes mellitus ([place]) 10/05/2020   History of CVA (cerebrovascular accident) 01/19/2020    Normocytic anemia    Benign essential HTN    Hyponatremia    Acute blood loss anemia    Hypotension due to drugs    Vascular headache    Prerenal azotemia    Sleep disturbance    Leukocytosis    Transaminitis    Marijuana abuse    Essential hypertension    Dysphagia, post-stroke    Left hemiparesis (HCC)    Acute respiratory failure (HCC)    ICH (intracerebral hemorrhage) ([place]) 12/06/2019    Arliss Journey, PT, DPT 02/27/2021, 6:01 PM  [facility] [address] [place], [place], [address] Phone: [phone]   Fax:  [phone]  Name: Mamie Pecore MRN: [id number] Date of Birth: August 01, 1980

## 2021-03-06 ENCOUNTER — Other Ambulatory Visit: Payer: Self-pay

## 2021-03-06 ENCOUNTER — Ambulatory Visit: Payer: Managed Care, Other (non HMO) | Admitting: Physical Therapy

## 2021-03-06 ENCOUNTER — Encounter: Payer: Self-pay | Admitting: Physical Therapy

## 2021-03-06 ENCOUNTER — Ambulatory Visit: Payer: Managed Care, Other (non HMO) | Admitting: Occupational Therapy

## 2021-03-06 DIAGNOSIS — R2689 Other abnormalities of gait and mobility: Secondary | ICD-10-CM

## 2021-03-06 DIAGNOSIS — I69354 Hemiplegia and hemiparesis following cerebral infarction affecting left non-dominant side: Secondary | ICD-10-CM

## 2021-03-06 DIAGNOSIS — M6281 Muscle weakness (generalized): Secondary | ICD-10-CM

## 2021-03-06 DIAGNOSIS — R2681 Unsteadiness on feet: Secondary | ICD-10-CM

## 2021-03-06 NOTE — Therapy (Signed)
[facility] [address] [place], [place], [address] Phone: [phone]   Fax:  [phone]  Physical Therapy Treatment  Patient Details  Name: Dennis Mercado MRN: [id number] Date of Birth: July 10, 1980 Referring Provider (PT): [name], MD   Encounter Date: 03/06/2021   PT End of Session - 03/06/21 1805     Visit Number 26    Number of Visits 29    Date for PT Re-Evaluation 03/31/21    Authorization Type VL: 17 visits PT, 30 OT    Authorization - Visit Number 25    Authorization - Number of Visits 60    PT Start Time 3664    PT Stop Time 1700    PT Time Calculation (min) 42 min    Equipment Utilized During Treatment Gait belt    Activity Tolerance Patient tolerated treatment well    Behavior During Therapy [facility] for tasks assessed/performed             History reviewed. No pertinent past medical history.  Past Surgical History:  Procedure Laterality Date   WISDOM TOOTH EXTRACTION      There were no vitals filed for this visit.   Subjective Assessment - 03/06/21 1621     Subjective Nothing new.  Gets his wisdom teeth pulled thursday.    Pertinent History HTN    Limitations Walking    Patient Stated Goals wants to work on not walking with the cane    Currently in Pain? No/denies                               [facility] Adult PT Treatment/Exercise - 03/06/21 1624       Ambulation/Gait   Ambulation/Gait Yes    Ambulation/Gait Assistance 5: Supervision    Ambulation/Gait Assistance Details small distances between activities with no AD    Assistive device None    Gait Pattern Step-through pattern;Decreased step length - left;Decreased stance time - left;Decreased hip/knee flexion - left;Decreased arm swing - left    Ambulation Surface Level;Indoor      Neuro Re-ed    Neuro Re-ed Details  pt asking about what exercises he can perform at home in half/tall kneeling as he has a  yoga mat at home and can use his couch to get on/off the floor. Performed tall kneel mini squats x10 reps with bias towards LLE, keeping LLE as stance leg stepping RLE out and in with UE support with cues for proper technique and glute/core activation x10 reps. In half kneel performed with LLE forwards and RUE support and shifting weight forwards/backwards for incr weight bearing through LLE and closed chain ankle DF stretching. Performed x6 reps with LLE posteriorly and bringing RLE forwrads and back to midline, discussed having his wife there with him initially when performing at home for safety with this one      Knee/Hip Exercises: Aerobic   Other Aerobic Completed SciFit with BLE and RUE x7 minutes at start on session on Level 2.7 for strengthening and ROM.                 Balance Exercises - 03/06/21 0001       Balance Exercises: Standing   Balance Beam with LLE as stance leg - stepping RLE out and then back to midline UE support > none with min gurad x10 reps, stepping RLE forward and then back on x10 reps UE support>none  PT Education - 03/06/21 1805     Education Details tall kneel/half kneel additions to HEP    Person(s) Educated Patient    Methods Explanation;Demonstration;Handout    Comprehension Verbalized understanding;Returned demonstration              PT Short Term Goals - 02/27/21 1543       PT SHORT TERM GOAL #1   Title Pt will undergo further assessment of gait speed with no AD with LTG written. ALL STGS DUE 02/28/21    Baseline not yet assessed; 22.13 seconds = 1.48 ft/sec    Time 4    Period Weeks    Status Achieved    Target Date 02/28/21      PT SHORT TERM GOAL #2   Title Pt will decr TUG time with no AD to 29 seconds or less in order to demo decr fall risk.    Baseline 32.44 seconds; 18.03 seconds with no AD on 02/27/21    Time 4    Period Weeks    Status Achieved      PT SHORT TERM GOAL #3   Title Pt will ambulate at  least 230' on indoor level surfaces with supervision with no AD in order to demo improved household mobility.    Time 4    Period Weeks    Status Achieved               PT Long Term Goals - 02/27/21 1625       PT LONG TERM GOAL #1   Title Pt and pt's spouse will be independent with final HEP in order to build upon functional gains made in therapy. ALL LTGS DUE 03/28/21    Baseline pt reports independence with HEP, will continue to benefit from progressive HEP    Time 8    Period Weeks    Status On-going      PT LONG TERM GOAL #2   Title Pt will perform 4 steps with cane and no handrail with supervision for improved community access.    Time 8    Period Weeks    Status New      PT LONG TERM GOAL #3   Title Pt will perform TUG in 15 seconds or less with no AD in order to demo decr fall risk.    Baseline 32.44 seconds no AD, 18.03 seconds no AD    Time 8    Period Weeks    Status Revised      PT LONG TERM GOAL #5   Title Pt will improve gait speed with no AD to at least 1.7 ft/sec for improved gait efficiency/decr fall risk.    Baseline 1.48 ft/sec    Time 8    Period Weeks    Status Revised                   Plan - 03/06/21 1809     Clinical Impression Statement Focused on standing balance on compliant surfaces with LLE as stance leg and working on decr UE support. Added exercises to pt's HEP that he can perform in half/tall kneel with the support of his couch at home. Pt tolerated session well, will continue to progress towards LTGs.    Personal Factors and Comorbidities Comorbidity 1;Past/Current Experience;Profession    Comorbidities HTN    Examination-Activity Limitations Stand;Stairs;Squat;Transfers;Locomotion Level    Examination-Participation Restrictions Community Activity;Occupation   walking his dog   Stability/Clinical Decision Making Stable/Uncomplicated    Rehab Potential  Good    PT Frequency 1x / week    PT Duration 8 weeks   6-8 weeks   PT  Treatment/Interventions ADLs/Self Care Home Management;Aquatic Therapy;Electrical Stimulation;DME Instruction;Gait training;Stair training;Functional mobility training;Therapeutic activities;Therapeutic exercise;Balance training;Neuromuscular re-education;Wheelchair mobility training;Orthotic Fit/Training;Patient/family education;Passive range of motion;Energy conservation;Vestibular    PT Next Visit Plan gait with no AD. how did tall/half kneel exercises going at home? balance strategies on different surfaces/decr UE support and eyes closed. NMR/strengthening for LLE and SLS. need to add more appts.    PT Home Exercise Plan [id number]    Consulted and Agree with Plan of Care Patient             Patient will benefit from skilled therapeutic intervention in order to improve the following deficits and impairments:  Abnormal gait, Decreased activity tolerance, Decreased balance, Decreased mobility, Decreased knowledge of use of DME, Decreased coordination, Decreased range of motion, Decreased strength, Difficulty walking, Impaired tone, Impaired sensation, Postural dysfunction  Visit Diagnosis: Hemiplegia and hemiparesis following cerebral infarction affecting left non-dominant side (HCC)  Unsteadiness on feet  Muscle weakness (generalized)  Other abnormalities of gait and mobility     Problem List Patient Active Problem List   Diagnosis Date Noted   Elevated glucose 10/13/2020   Hyperlipidemia associated with type 2 diabetes mellitus ([place]) 10/05/2020   History of CVA (cerebrovascular accident) 01/19/2020   Normocytic anemia    Benign essential HTN    Hyponatremia    Acute blood loss anemia    Hypotension due to drugs    Vascular headache    Prerenal azotemia    Sleep disturbance    Leukocytosis    Transaminitis    Marijuana abuse    Essential hypertension    Dysphagia, post-stroke    Left hemiparesis (HCC)    Acute respiratory failure (HCC)    ICH (intracerebral hemorrhage)  ([place]) 12/06/2019    Arliss Journey, PT, DPT  03/06/2021, 6:10 PM  [facility] [address] [place], [place], [address] Phone: [phone]   Fax:  [phone]  Name: Dennis Mercado MRN: [id number] Date of Birth: 05-12-1981

## 2021-03-06 NOTE — Patient Instructions (Signed)
Next to your couch on the floor:   -in tall kneeling:  -mini squats going down to left side and coming back to middle x10 reps -staying on left side gently stepping right leg out and in (really focus on shifting to your left side) x10 reps  -in half kneeling with left leg forwards practice gently rocking forwards and back  -can try bringing right leg forwards and back to middle if April is next to you

## 2021-03-13 ENCOUNTER — Encounter: Payer: Self-pay | Admitting: Physical Therapy

## 2021-03-13 ENCOUNTER — Ambulatory Visit: Payer: Managed Care, Other (non HMO) | Admitting: Physical Therapy

## 2021-03-13 ENCOUNTER — Other Ambulatory Visit: Payer: Self-pay

## 2021-03-13 DIAGNOSIS — M6281 Muscle weakness (generalized): Secondary | ICD-10-CM

## 2021-03-13 DIAGNOSIS — I69354 Hemiplegia and hemiparesis following cerebral infarction affecting left non-dominant side: Secondary | ICD-10-CM | POA: Diagnosis not present

## 2021-03-13 DIAGNOSIS — R2681 Unsteadiness on feet: Secondary | ICD-10-CM

## 2021-03-13 NOTE — Therapy (Addendum)
[facility] [address] [place], [place], [address] Phone: [phone]   Fax:  [phone]  Physical Therapy Treatment  Patient Details  Name: Dennis Mercado MRN: [id number] Date of Birth: 1981/01/08 Referring Provider (PT): [name], MD   Encounter Date: 03/13/2021   PT End of Session - 03/13/21 1615     Visit Number 27    Number of Visits 29    Date for PT Re-Evaluation 03/31/21    Authorization Type VL: 93 visits PT, 30 OT    Authorization - Visit Number 26    Authorization - Number of Visits 60    PT Start Time 1532    PT Stop Time 1613    PT Time Calculation (min) 41 min    Equipment Utilized During Treatment Gait belt    Activity Tolerance Patient tolerated treatment well    Behavior During Therapy [facility] for tasks assessed/performed             History reviewed. No pertinent past medical history.  Past Surgical History:  Procedure Laterality Date   WISDOM TOOTH EXTRACTION      There were no vitals filed for this visit.   Subjective Assessment - 03/13/21 1534     Subjective Wisdom teeth surgery went well. Has not had time to try the tall/half kneel exercises for home    Pertinent History HTN    Limitations Walking    Patient Stated Goals wants to work on not walking with the cane    Currently in Pain? No/denies                               [facility] Adult PT Treatment/Exercise - 03/13/21 0001       Ambulation/Gait   Ambulation/Gait Yes    Ambulation/Gait Assistance 5: Supervision    Ambulation/Gait Assistance Details with use of cane with tripod tip. pt reporting BLE feeling too fatigued after wisdom teeth surgery to ambulate with no AD    Assistive device None    Gait Pattern Step-through pattern;Decreased step length - left;Decreased stance time - left;Decreased hip/knee flexion - left;Decreased arm swing - left    Ambulation Surface Level;Indoor                  Balance Exercises - 03/13/21 1605       Balance Exercises: Standing   Standing Eyes Closed Foam/compliant surface    Standing Eyes Closed Limitations On air ex: 4 x 30 seconds - feet hip width, cues to incr LLE weight shift    SLS Eyes open;Foam/compliant surface    SLS Limitations on blue mat: with LLE as stance leg tapping RLE to colorful floor bubble x12 reps with UE support>none, then performed with UE support forward/cross body cone tap x10 reps    Partial Tandem Stance Foam/compliant surface;Eyes open;Eyes closed;Limitations    Partial Tandem Stance Limitations on air ex with LLE posteriorly, x 5 reps and 10 reps head turns, ,x5 and x10 reps head nods    Sidestepping 4 reps;Foam/compliant support    Sidestepping Limitations on blue mat down and back x4 reps with cues for incr stance time on LLE and bigger steps with LLE when side stepping to R, no UE support   Other Standing Exercises on air ex: x10 reps mini squats - using the mirror for visual cue for LLE weight shift wit 3-5 second holds   Other Standing Exercises Comments  on blue mat: forwards/backwards walking with no UE support, cues for posture and LLE weight shift                PT Education - 03/13/21 1614     Education Details Adding more appts to last 2 weeks in POC    Person(s) Educated Patient    Methods Explanation    Comprehension Verbalized understanding              PT Short Term Goals - 02/27/21 1543       PT SHORT TERM GOAL #1   Title Pt will undergo further assessment of gait speed with no AD with LTG written. ALL STGS DUE 02/28/21    Baseline not yet assessed; 22.13 seconds = 1.48 ft/sec    Time 4    Period Weeks    Status Achieved    Target Date 02/28/21      PT SHORT TERM GOAL #2   Title Pt will decr TUG time with no AD to 29 seconds or less in order to demo decr fall risk.    Baseline 32.44 seconds; 18.03 seconds with no AD on 02/27/21    Time 4    Period Weeks    Status  Achieved      PT SHORT TERM GOAL #3   Title Pt will ambulate at least 230' on indoor level surfaces with supervision with no AD in order to demo improved household mobility.    Time 4    Period Weeks    Status Achieved               PT Long Term Goals - 02/27/21 1625       PT LONG TERM GOAL #1   Title Pt and pt's spouse will be independent with final HEP in order to build upon functional gains made in therapy. ALL LTGS DUE 03/28/21    Baseline pt reports independence with HEP, will continue to benefit from progressive HEP    Time 8    Period Weeks    Status On-going      PT LONG TERM GOAL #2   Title Pt will perform 4 steps with cane and no handrail with supervision for improved community access.    Time 8    Period Weeks    Status New      PT LONG TERM GOAL #3   Title Pt will perform TUG in 15 seconds or less with no AD in order to demo decr fall risk.    Baseline 32.44 seconds no AD, 18.03 seconds no AD    Time 8    Period Weeks    Status Revised      PT LONG TERM GOAL #5   Title Pt will improve gait speed with no AD to at least 1.7 ft/sec for improved gait efficiency/decr fall risk.    Baseline 1.48 ft/sec    Time 8    Period Weeks    Status Revised                   Plan - 03/13/21 2131     Clinical Impression Statement Today's skilled session focused on balance strategies on compliant surfaces with LLE SLS and gait with no UE support. Pt challenged by SLS tasks when on a compliant surface. Pt asking about being able to leave his cane behind when going out in his yard when he is by himself (not with his wife). Discussed due to balance/LLE fatigue with  incr activity, cane is still the best/safest option. Will practice at next session outdoors with no AD.    Personal Factors and Comorbidities Comorbidity 1;Past/Current Experience;Profession    Comorbidities HTN    Examination-Activity Limitations Stand;Stairs;Squat;Transfers;Locomotion Level     Examination-Participation Restrictions Community Activity;Occupation   walking his dog   Stability/Clinical Decision Making Stable/Uncomplicated    Rehab Potential Good    PT Frequency 1x / week    PT Duration 8 weeks   6-8 weeks   PT Treatment/Interventions ADLs/Self Care Home Management;Aquatic Therapy;Electrical Stimulation;DME Instruction;Gait training;Stair training;Functional mobility training;Therapeutic activities;Therapeutic exercise;Balance training;Neuromuscular re-education;Wheelchair mobility training;Orthotic Fit/Training;Patient/family education;Passive range of motion;Energy conservation;Vestibular    PT Next Visit Plan gait with no AD-work on outdoor surfaces. how did tall/half kneel exercises going at home? balance strategies on different surfaces/decr UE support and eyes closed. NMR/strengthening for LLE and SLS    PT Home Exercise Plan [email or web address]    Consulted and Agree with Plan of Care Patient             Patient will benefit from skilled therapeutic intervention in order to improve the following deficits and impairments:  Abnormal gait, Decreased activity tolerance, Decreased balance, Decreased mobility, Decreased knowledge of use of DME, Decreased coordination, Decreased range of motion, Decreased strength, Difficulty walking, Impaired tone, Impaired sensation, Postural dysfunction  Visit Diagnosis: Hemiplegia and hemiparesis following cerebral infarction affecting left non-dominant side (HCC)  Unsteadiness on feet  Muscle weakness (generalized)     Problem List Patient Active Problem List   Diagnosis Date Noted   Elevated glucose 10/13/2020   Hyperlipidemia associated with type 2 diabetes mellitus ([place]) 10/05/2020   History of CVA (cerebrovascular accident) 01/19/2020   Normocytic anemia    Benign essential HTN    Hyponatremia    Acute blood loss anemia    Hypotension due to drugs    Vascular headache    Prerenal azotemia    Sleep disturbance     Leukocytosis    Transaminitis    Marijuana abuse    Essential hypertension    Dysphagia, post-stroke    Left hemiparesis (HCC)    Acute respiratory failure (HCC)    ICH (intracerebral hemorrhage) ([place]) 12/06/2019    Arliss Journey, PT, DPT  03/13/2021, 9:34 PM  [facility] [address] [place], [place], [address] Phone: [phone]   Fax:  [phone]  Name: Cortez Flippen MRN: [id number] Date of Birth: 1980/11/28

## 2021-03-20 ENCOUNTER — Other Ambulatory Visit: Payer: Self-pay

## 2021-03-20 ENCOUNTER — Encounter: Payer: Self-pay | Admitting: Occupational Therapy

## 2021-03-20 ENCOUNTER — Ambulatory Visit: Payer: Managed Care, Other (non HMO) | Attending: Physician Assistant | Admitting: Occupational Therapy

## 2021-03-20 DIAGNOSIS — R29818 Other symptoms and signs involving the nervous system: Secondary | ICD-10-CM | POA: Insufficient documentation

## 2021-03-20 DIAGNOSIS — R2681 Unsteadiness on feet: Secondary | ICD-10-CM | POA: Diagnosis present

## 2021-03-20 DIAGNOSIS — R208 Other disturbances of skin sensation: Secondary | ICD-10-CM | POA: Diagnosis present

## 2021-03-20 DIAGNOSIS — M6281 Muscle weakness (generalized): Secondary | ICD-10-CM | POA: Insufficient documentation

## 2021-03-20 DIAGNOSIS — I69354 Hemiplegia and hemiparesis following cerebral infarction affecting left non-dominant side: Secondary | ICD-10-CM | POA: Insufficient documentation

## 2021-03-20 DIAGNOSIS — R41842 Visuospatial deficit: Secondary | ICD-10-CM | POA: Diagnosis present

## 2021-03-20 NOTE — Therapy (Signed)
[facility] [address] [place], [place], [address] Phone: [phone]   Fax:  [phone]  Occupational Therapy Treatment and Progress Update  Patient Details  Name: Dennis Mercado MRN: [id number] Date of Birth: 10-Apr-1981 No data recorded  Encounter Date: 03/20/2021   OT End of Session - 03/20/21 1729     Visit Number 20    Number of Visits 28    Date for OT Re-Evaluation 05/19/21    Authorization Type recertify for remaining 9 visits - every other week schedule    Authorization Time Period per patient 30 visit OT limit    Authorization - Visit Number 20    Progress Note Due on Visit 30    OT Start Time 1530    OT Stop Time 1615    OT Time Calculation (min) 45 min    Activity Tolerance Patient tolerated treatment well    Behavior During Therapy [facility] for tasks assessed/performed             History reviewed. No pertinent past medical history.  Past Surgical History:  Procedure Laterality Date   WISDOM TOOTH EXTRACTION      There were no vitals filed for this visit.   Subjective Assessment - 03/20/21 1715     Subjective  Patient starts new job on Monday.  Will be working from home.  Has office set up in his home.  Has been doing test run of work day to build endurance.    Pertinent History Pt. presented to ED on 12/06/2019 with left side weakness and  altered mental status s/p coitus.  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 Pt required intubation 6/22 due to ARF secondary to aspiration PNA. Received inpatient rehab. Pt was discharged 7/28/2    Limitations HTN, left inattention, apraxia    Patient Stated Goals to be able to use my hand    Currently in Pain? No/denies    Pain Score 0-No pain                          OT Treatments/Exercises (OP) - 03/20/21 0001       Neurological Re-education  Exercises   Other Exercises 1 Working on shifting onto left side in tall kneeling.  Patient initially needed facilitation and support for left weight shift to smoothly move RLE.  With repetition, able to shift and sustain weight over to LLE.  Transitioned to 4point to address weight through distal LUE.  Initially onto fisted hand, then onto curved surface.  Worked on body on arm motion to increase distraction between carpals and distal forearm.  Worked on whole body coordinated movement 4 point to side sit to either side.  Patient continues with challenges shifting to L side.                      OT Short Term Goals - 03/20/21 1733       OT SHORT TERM GOAL #1   Title I with inital HEP    Time 4    Period Weeks    Status Achieved      OT SHORT TERM GOAL #2   Title Patient will demonstrate low reach to 30 degrees of shoulder flexion with min assist    Time 4    Period Weeks    Status Achieved      OT SHORT  TERM GOAL #3   Title Patient will demonstrate 30 degrees of active assited wrist and forearm motion    Time 4    Period Weeks    Status Achieved      OT SHORT TERM GOAL #4   Title Patient will walk 20 feet and carry lightweight object using his left arm.    Time 6    Period Weeks    Status On-going    Target Date 01/04/21      OT SHORT TERM GOAL #5   Title Patient will transport himself to aquatic therapy without assistance    Time 6    Period Weeks    Status Partially Met    Target Date 01/04/21               OT Long Term Goals - 03/20/21 1733       OT LONG TERM GOAL #1   Title I with aquatic HEP    Time 8    Period Weeks    Status Achieved      OT LONG TERM GOAL #2   Title Patient will demonstrate low reach to 45 degrees of shoulder flexion with elbow extension    Time 8    Period Weeks    Status Achieved      OT LONG TERM GOAL #3   Title Patient will demonstrate no pain with passive range of motion to 120 shoulder flexion/abduction     Time 8    Period Weeks    Status Achieved      OT LONG TERM GOAL #4   Title Patient will incorporate self stretching techniques throughout the day whether at home or work.    Time 12    Period Weeks    Status Achieved      OT LONG TERM GOAL #5   Title Patient will reach to hit target at at least 70 degrees of shoulder flexion while upright    Time 12    Period Weeks    Status On-going      OT LONG TERM GOAL #6   Title xxx                   Plan - 03/20/21 1731     Clinical Impression Statement Patient with continued challenge in weight shifting towards Left side, and left spastic hemiplegia    OT Occupational Profile and History Detailed Assessment- Review of Records and additional review of physical, cognitive, psychosocial history related to current functional performance    Occupational performance deficits (Please refer to evaluation for details): ADL's;IADL's;Leisure;Social Participation;Work;Play    Body Structure / Function / Physical Skills ADL;Balance;Endurance;Mobility;Strength;UE functional use;FMC;Vision;Coordination;Gait;Decreased knowledge of precautions;GMC;ROM;Decreased knowledge of use of DME;Dexterity;IADL;Sensation;Tone;Body Banker;Safety Awareness;Thought;Understand    Rehab Potential Good    Clinical Decision Making Limited treatment options, no task modification necessary    Comorbidities Affecting Occupational Performance: May have comorbidities impacting occupational performance    Modification or Assistance to Complete Evaluation  No modification of tasks or assist necessary to complete eval    OT Frequency Other (comment)   9 more visits   OT Duration 12 weeks    OT Treatment/Interventions Self-care/ADL training;Therapeutic exercise;Functional Mobility Training;Balance training;Splinting;Manual Therapy;Neuromuscular education;Aquatic Therapy;Ultrasound;Energy conservation;Therapeutic  activities;Cryotherapy;Paraffin;DME and/or AE instruction;Cognitive remediation/compensation;Visual/perceptual remediation/compensation;Gait Training;Fluidtherapy;Electrical Stimulation;Moist Heat;Contrast Bath;Passive range of motion;Patient/family education    Plan aquatic therapy and clinic therapy to address spasticity, need left trunk lengthening/right trunk shortening, realignment of shoulder girdle, and isolated  glenohumeral joint motion    Consulted and Agree with Plan of Care Patient             Patient will benefit from skilled therapeutic intervention in order to improve the following deficits and impairments:   Body Structure / Function / Physical Skills: ADL, Balance, Endurance, Mobility, Strength, UE functional use, FMC, Vision, Coordination, Gait, Decreased knowledge of precautions, GMC, ROM, Decreased knowledge of use of DME, Dexterity, IADL, Sensation, Tone, Body mechanics Cognitive Skills: Attention, Memory, Problem Solve, Safety Awareness, Thought, Understand     Visit Diagnosis: Hemiplegia and hemiparesis following cerebral infarction affecting left non-dominant side (HCC)  Unsteadiness on feet  Muscle weakness (generalized)  Other disturbances of skin sensation  Other symptoms and signs involving the nervous system  Visuospatial deficit    Problem List Patient Active Problem List   Diagnosis Date Noted   Elevated glucose 10/13/2020   Hyperlipidemia associated with type 2 diabetes mellitus ([place]) 10/05/2020   History of CVA (cerebrovascular accident) 01/19/2020   Normocytic anemia    Benign essential HTN    Hyponatremia    Acute blood loss anemia    Hypotension due to drugs    Vascular headache    Prerenal azotemia    Sleep disturbance    Leukocytosis    Transaminitis    Marijuana abuse    Essential hypertension    Dysphagia, post-stroke    Left hemiparesis (HCC)    Acute respiratory failure (HCC)    ICH (intracerebral hemorrhage) ([place])  12/06/2019    Mariah Milling, OT/L 03/20/2021, 5:34 PM  [facility] [address] [place], [place], [address] Phone: [phone]   Fax:  [phone]  Name: Anuel Sitter MRN: [id number] Date of Birth: July 18, 1980

## 2021-03-21 ENCOUNTER — Ambulatory Visit: Payer: Managed Care, Other (non HMO) | Admitting: Physical Therapy

## 2021-03-21 ENCOUNTER — Encounter: Payer: Self-pay | Admitting: Physical Therapy

## 2021-03-21 DIAGNOSIS — I69354 Hemiplegia and hemiparesis following cerebral infarction affecting left non-dominant side: Secondary | ICD-10-CM | POA: Diagnosis not present

## 2021-03-21 DIAGNOSIS — M6281 Muscle weakness (generalized): Secondary | ICD-10-CM

## 2021-03-21 DIAGNOSIS — R2681 Unsteadiness on feet: Secondary | ICD-10-CM

## 2021-03-21 NOTE — Therapy (Addendum)
[facility] [address] [place], [place], [address] Phone: [phone]   Fax:  [phone]  Physical Therapy Treatment  Patient Details  Name: Dennis Mercado MRN: [id number] Date of Birth: 07/21/1980 Referring Provider (PT): [name], MD   Encounter Date: 03/21/2021   PT End of Session - 03/21/21 1615     Visit Number 28    Number of Visits 29    Date for PT Re-Evaluation 03/31/21    Authorization Type VL: 6 visits PT, 30 OT    Authorization - Visit Number 27    Authorization - Number of Visits 60    PT Start Time 3202    PT Stop Time 1614    PT Time Calculation (min) 42 min    Equipment Utilized During Treatment Gait belt    Activity Tolerance Patient tolerated treatment well    Behavior During Therapy WFL for tasks assessed/performed             History reviewed. No pertinent past medical history.  Past Surgical History:  Procedure Laterality Date   WISDOM TOOTH EXTRACTION      There were no vitals filed for this visit.   Subjective Assessment - 03/21/21 1534     Subjective Getting ready for work. Had his monitors delivered today.    Pertinent History HTN    Limitations Walking    Patient Stated Goals wants to work on not walking with the cane    Currently in Pain? No/denies                               [facility] Adult PT Treatment/Exercise - 03/21/21 1548       Ambulation/Gait   Ambulation/Gait Yes    Ambulation/Gait Assistance 4: Min guard;5: Supervision    Ambulation/Gait Assistance Details No AD on grass, gravel, mulch surfaces, pt needing min guard on grass with pt with decr stance time on LLE. Discussed for safety in the grass at home, having cane with him for grass surfaces for balance/improved gait mechanics. Additional distances on pavement with no AD with pt able to incr stance time on LLE    Ambulation Distance (Feet) 50 Feet   x4   Assistive device  None    Gait Pattern Step-through pattern;Decreased step length - left;Decreased stance time - left;Decreased hip/knee flexion - left;Decreased arm swing - left    Ambulation Surface Level;Unlevel;Indoor;Outdoor;Paved;Gravel;Grass                 Access Code: [email or web address] URL: [email or web address] Date: 03/21/2021 Prepared by: Janann August  Reviewed bolded exercises to pt's HEP and made updates as appropriate. See MedBridge for further details.   Pt has not yet had the chance to try the tall kneel/half kneel exercises due to pt needing to get a better mat to put on the floor.   Program Notes Next to your couch on the floor:    -in tall kneeling:  -mini squats going down to left side and coming back to middle x10 reps -staying on left side gently stepping right leg out and in (really focus on shifting to your left side) x10 reps   -in half kneeling with left leg forwards practice gently rocking forwards and back  -can try bringing right leg forwards and back to middle if April is next to you    Exercises Supine March with Resistance Band - 1-2 x daily - 5 x weekly -  1-2 sets - 10 reps - performed without use of band, cues for eccentric control with march   Sit to Stand with Armchair - 1 x daily - 5 x weekly - 2 sets - 10 reps - performed with LLE staggered posteriorly   Small Range Straight Leg Raise - 1 x daily - 5 x weekly - 2 sets - 5 reps - cues to perform first with quad set  Marching Bridge - 1 x daily - 5 x weekly - 2 sets - 5 reps - with LLE as stance leg.   Seated Hamstring Stretch (Mirrored) - 1 x daily - 5 x weekly - 3 sets - 30 hold - pt has not been performing at home, discussed use of ottoman/chair to stretch leg out on or a step stool/book to prop L foot on,cues for anterior pelvic tilt for incr stretch, pt reports feeling good after performing and is going to try to figure out how to do this one at home especially when going back to work.    Mini Squat - 1 x daily - 5 x weekly - 2 sets - 10 reps - cues for equal weight bearing and 3 second hold   Pt also performing lower trunk rotations as stretch before getting out of bed in the morning.     PT Education - 03/21/21 1615     Education Details Updates/revisions to HEP (will need to finalize at next session and provide handout). Re-cert for an additional 1x week for 4 weeks and then plan to D/C after and take a break from therapy.    Person(s) Educated Patient    Methods Explanation;Demonstration    Comprehension Verbalized understanding;Returned demonstration              PT Short Term Goals - 02/27/21 1543       PT SHORT TERM GOAL #1   Title Pt will undergo further assessment of gait speed with no AD with LTG written. ALL STGS DUE 02/28/21    Baseline not yet assessed; 22.13 seconds = 1.48 ft/sec    Time 4    Period Weeks    Status Achieved    Target Date 02/28/21      PT SHORT TERM GOAL #2   Title Pt will decr TUG time with no AD to 29 seconds or less in order to demo decr fall risk.    Baseline 32.44 seconds; 18.03 seconds with no AD on 02/27/21    Time 4    Period Weeks    Status Achieved      PT SHORT TERM GOAL #3   Title Pt will ambulate at least 230' on indoor level surfaces with supervision with no AD in order to demo improved household mobility.    Time 4    Period Weeks    Status Achieved               PT Long Term Goals - 02/27/21 1625       PT LONG TERM GOAL #1   Title Pt and pt's spouse will be independent with final HEP in order to build upon functional gains made in therapy. ALL LTGS DUE 03/28/21    Baseline pt reports independence with HEP, will continue to benefit from progressive HEP    Time 8    Period Weeks    Status On-going      PT LONG TERM GOAL #2   Title Pt will perform 4 steps with cane and no handrail with  supervision for improved community access.    Time 8    Period Weeks    Status New      PT LONG TERM GOAL  #3   Title Pt will perform TUG in 15 seconds or less with no AD in order to demo decr fall risk.    Baseline 32.44 seconds no AD, 18.03 seconds no AD    Time 8    Period Weeks    Status Revised      PT LONG TERM GOAL #5   Title Pt will improve gait speed with no AD to at least 1.7 ft/sec for improved gait efficiency/decr fall risk.    Baseline 1.48 ft/sec    Time 8    Period Weeks    Status Revised                   Plan - 03/21/21 2037     Clinical Impression Statement Worked on gait outdoors over grass, mulch, and unlevel surfaces with no AD. Pt needing min guard when on grass surfaces. Discussed having cane with him when doing longer distances on grass surfaces in his yard. Remainder of session focused on updating and reviewing pt's HEP for strengthening/ROM. Will add standing stretches and standing weight shifting to LLE at next session. Will continue to progress towards LTGs.    Personal Factors and Comorbidities Comorbidity 1;Past/Current Experience;Profession    Comorbidities HTN    Examination-Activity Limitations Stand;Stairs;Squat;Transfers;Locomotion Level    Examination-Participation Restrictions Community Activity;Occupation   walking his dog   Stability/Clinical Decision Making Stable/Uncomplicated    Rehab Potential Good    PT Frequency 1x / week    PT Duration 8 weeks   6-8 weeks   PT Treatment/Interventions ADLs/Self Care Home Management;Aquatic Therapy;Electrical Stimulation;DME Instruction;Gait training;Stair training;Functional mobility training;Therapeutic activities;Therapeutic exercise;Balance training;Neuromuscular re-education;Wheelchair mobility training;Orthotic Fit/Training;Patient/family education;Passive range of motion;Energy conservation;Vestibular    PT Next Visit Plan Add standing calf stretch to HEP/ standing stepping with LLE as stance leg for incr weight bearing. check goals and plan to re-cert for an additional 1x week for 4 weeks and will  then D/C at that end of POC. gait with no AD-work on outdoor surfaces. how did tall/half kneel exercises going at home? balance strategies on different surfaces/decr UE support and eyes closed. LLE SLS.    PT Home Exercise Plan [email or web address]    Consulted and Agree with Plan of Care Patient             Patient will benefit from skilled therapeutic intervention in order to improve the following deficits and impairments:  Abnormal gait, Decreased activity tolerance, Decreased balance, Decreased mobility, Decreased knowledge of use of DME, Decreased coordination, Decreased range of motion, Decreased strength, Difficulty walking, Impaired tone, Impaired sensation, Postural dysfunction  Visit Diagnosis: Hemiplegia and hemiparesis following cerebral infarction affecting left non-dominant side (HCC)  Unsteadiness on feet  Muscle weakness (generalized)     Problem List Patient Active Problem List   Diagnosis Date Noted   Elevated glucose 10/13/2020   Hyperlipidemia associated with type 2 diabetes mellitus ([place]) 10/05/2020   History of CVA (cerebrovascular accident) 01/19/2020   Normocytic anemia    Benign essential HTN    Hyponatremia    Acute blood loss anemia    Hypotension due to drugs    Vascular headache    Prerenal azotemia    Sleep disturbance    Leukocytosis    Transaminitis    Marijuana abuse    Essential hypertension  Dysphagia, post-stroke    Left hemiparesis (HCC)    Acute respiratory failure ([place])    ICH (intracerebral hemorrhage) ([place]) 12/06/2019    Arliss Journey, PT, DPT 03/21/2021, 8:41 PM  [facility] [address] [place], [place], [address] Phone: [phone]   Fax:  [phone]  Name: Laddie Math MRN: [id number] Date of Birth: [birth year]

## 2021-03-28 ENCOUNTER — Ambulatory Visit: Payer: Managed Care, Other (non HMO)

## 2021-04-02 ENCOUNTER — Telehealth: Payer: Self-pay

## 2021-04-02 NOTE — Telephone Encounter (Signed)
[name] called:   He is doing great and does not think he needs a follow up visit. But he wants to schedule his next Botox injection.   Patient has been advised that he may need a re-evaluation before scheduling.   Please advise.   Call back ph [phone]

## 2021-04-03 ENCOUNTER — Ambulatory Visit: Payer: Managed Care, Other (non HMO) | Admitting: Occupational Therapy

## 2021-04-04 ENCOUNTER — Ambulatory Visit: Payer: Managed Care, Other (non HMO) | Admitting: Adult Health

## 2021-04-05 ENCOUNTER — Ambulatory Visit: Payer: Managed Care, Other (non HMO) | Admitting: Physical Medicine & Rehabilitation

## 2021-04-05 ENCOUNTER — Encounter: Payer: Managed Care, Other (non HMO) | Admitting: Physical Medicine & Rehabilitation

## 2021-04-17 ENCOUNTER — Encounter: Payer: Managed Care, Other (non HMO) | Admitting: Occupational Therapy

## 2021-05-03 ENCOUNTER — Ambulatory Visit: Payer: Managed Care, Other (non HMO) | Admitting: Family Medicine

## 2021-05-07 IMAGING — DX DG ABDOMEN 1V
1 series · 2 of 2 positions shown · non-contrast
Comparison: 12/10/2019

CLINICAL DATA: NG tube placement

EXAM:
ABDOMEN - 1 VIEW

[Series 1: abdomen · 0.14mm/px · 2 of 2 slices shown]
[im 1/2]
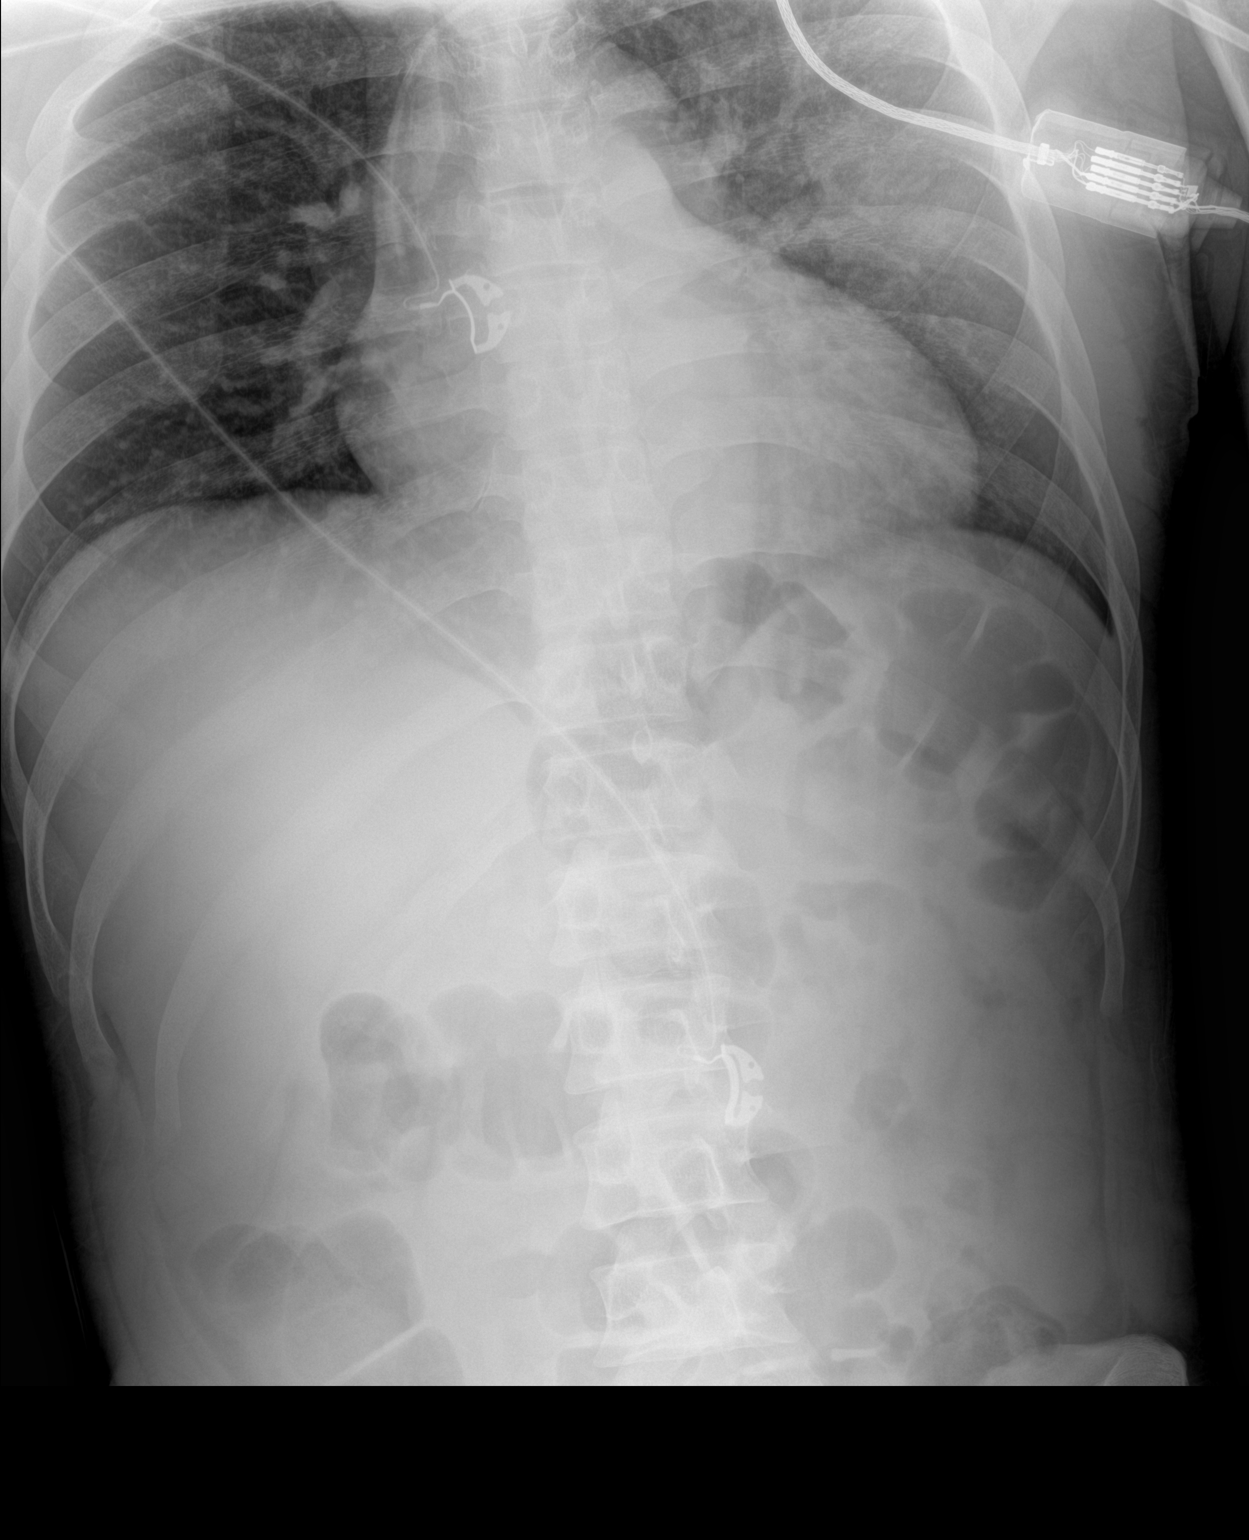
[im 2/2]
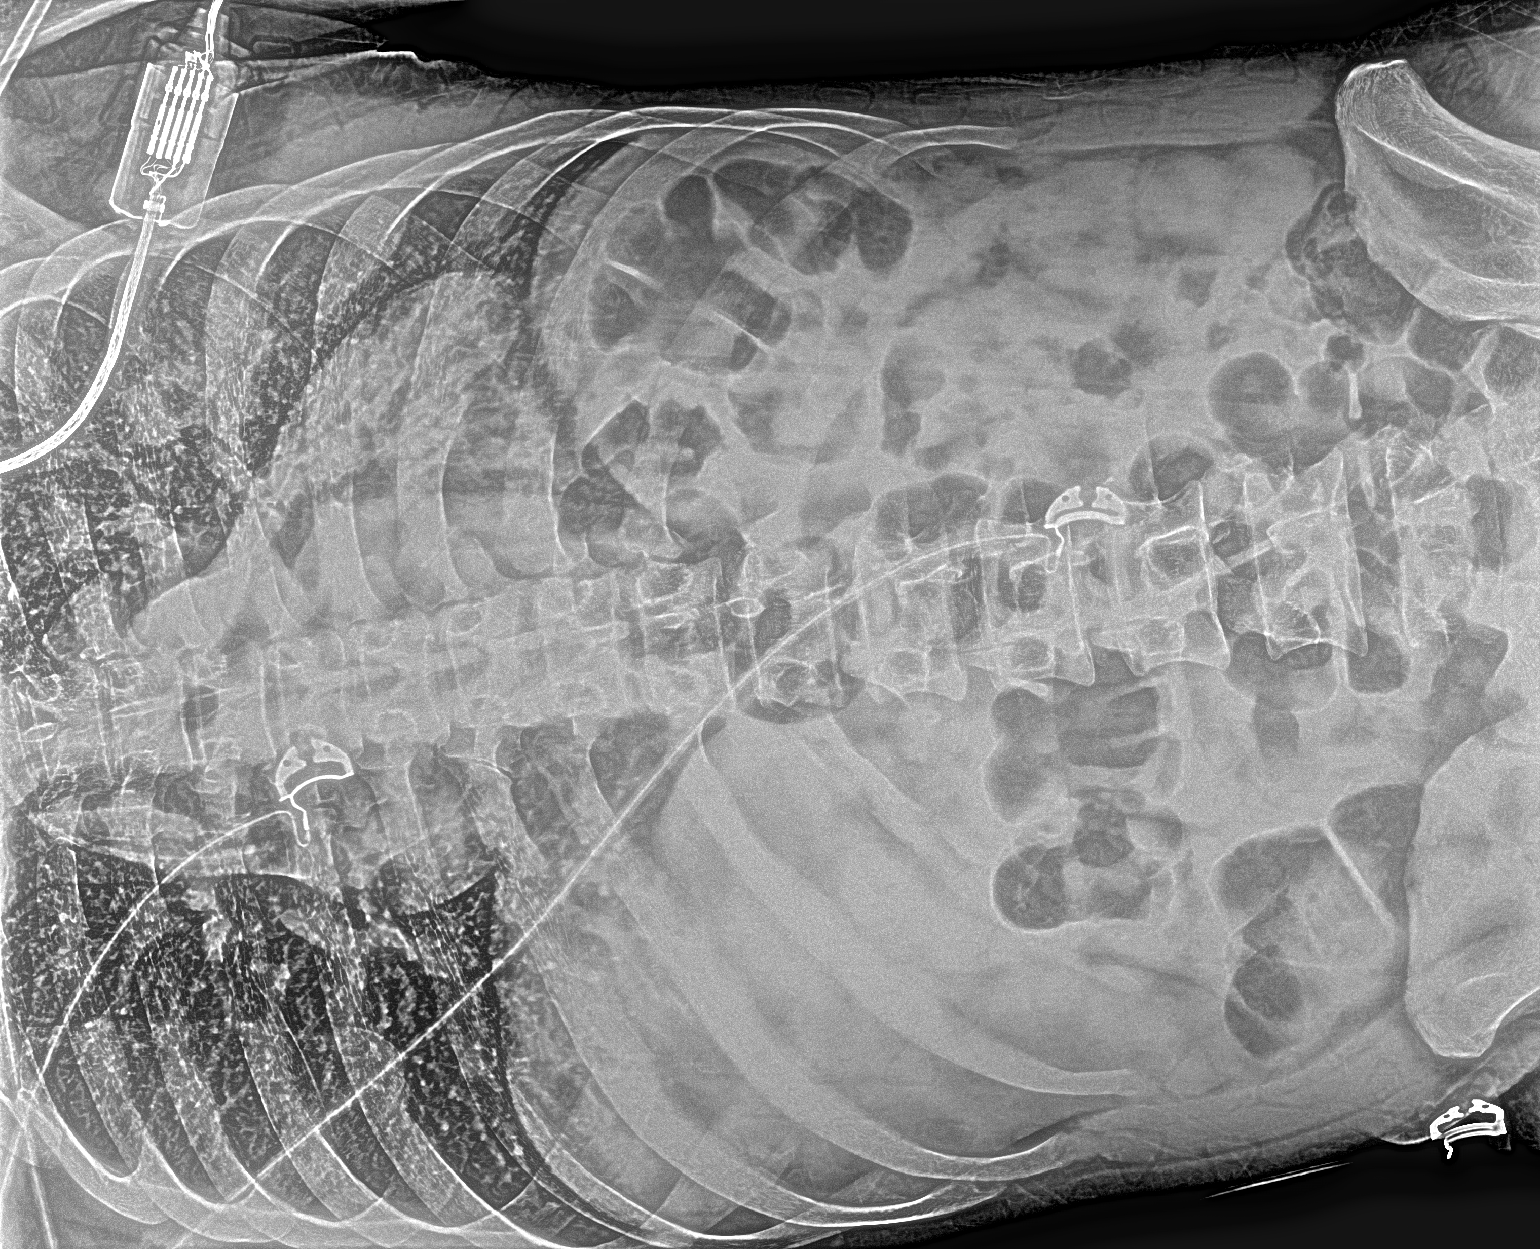

[2 of 2 positions shown; findings below may reference images not displayed]

FINDINGS: Partially visualized central venous catheter with the tip projecting
over the SVC. No esophageal catheter is visible on the current
image.
IMPRESSION: No esophageal catheter is visualized on the current images.

These results will be called to the ordering clinician or
representative by the Radiologist Assistant, and communication
documented in the PACS or [REDACTED].

## 2021-05-07 IMAGING — DX DG ABDOMEN 1V
1 series · 1 of 1 positions shown · non-contrast
Comparison: December 07, 2019.

CLINICAL DATA: Orogastric tube placement.

EXAM:
ABDOMEN - 1 VIEW

[abdomen kub]
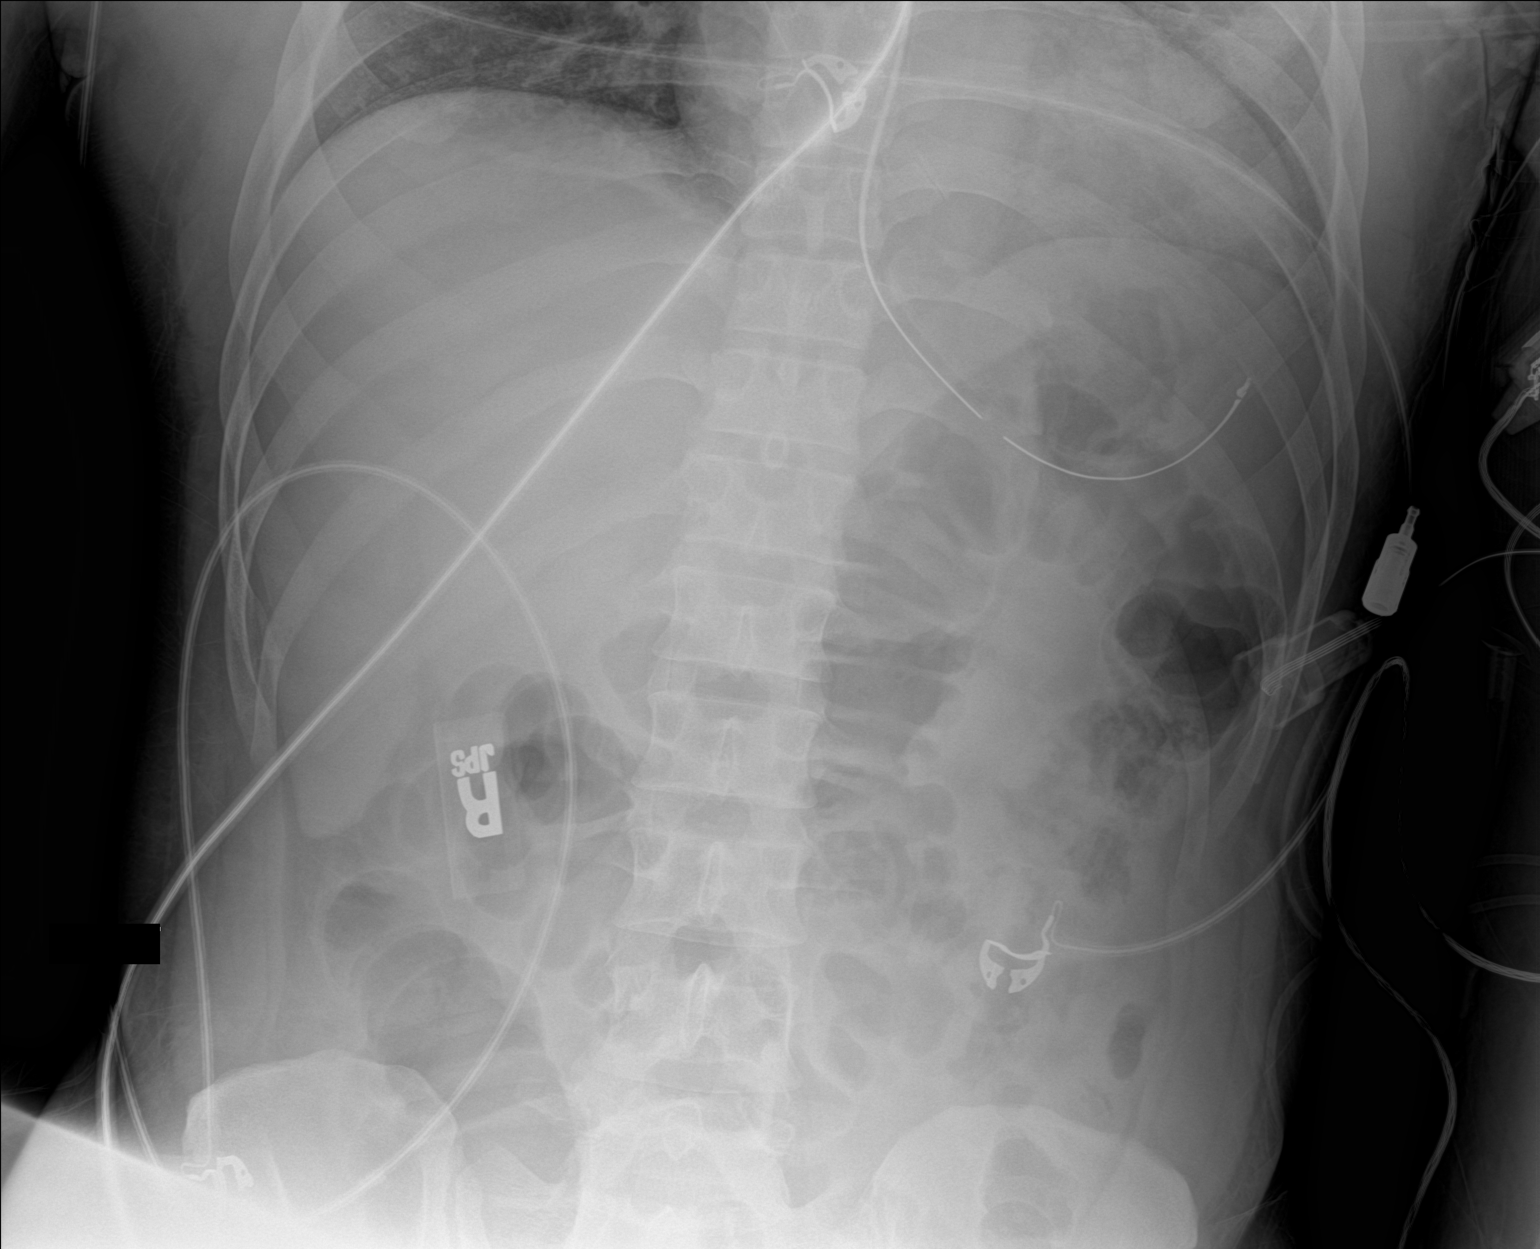

[1 of 1 positions shown; findings below may reference images not displayed]

FINDINGS: The bowel gas pattern is normal. Enteric tube tip is seen in
proximal stomach. No radio-opaque calculi or other significant
radiographic abnormality are seen.
IMPRESSION: Enteric tube tip seen in proximal stomach. No evidence of bowel
obstruction or ileus.

## 2021-05-13 IMAGING — DX DG CHEST 2V
3 series · 3 of 3 positions shown · non-contrast
Comparison: 12/11/2019

CLINICAL DATA: Fever

EXAM:
CHEST - 2 VIEW

[chest lat (1 of 2)]
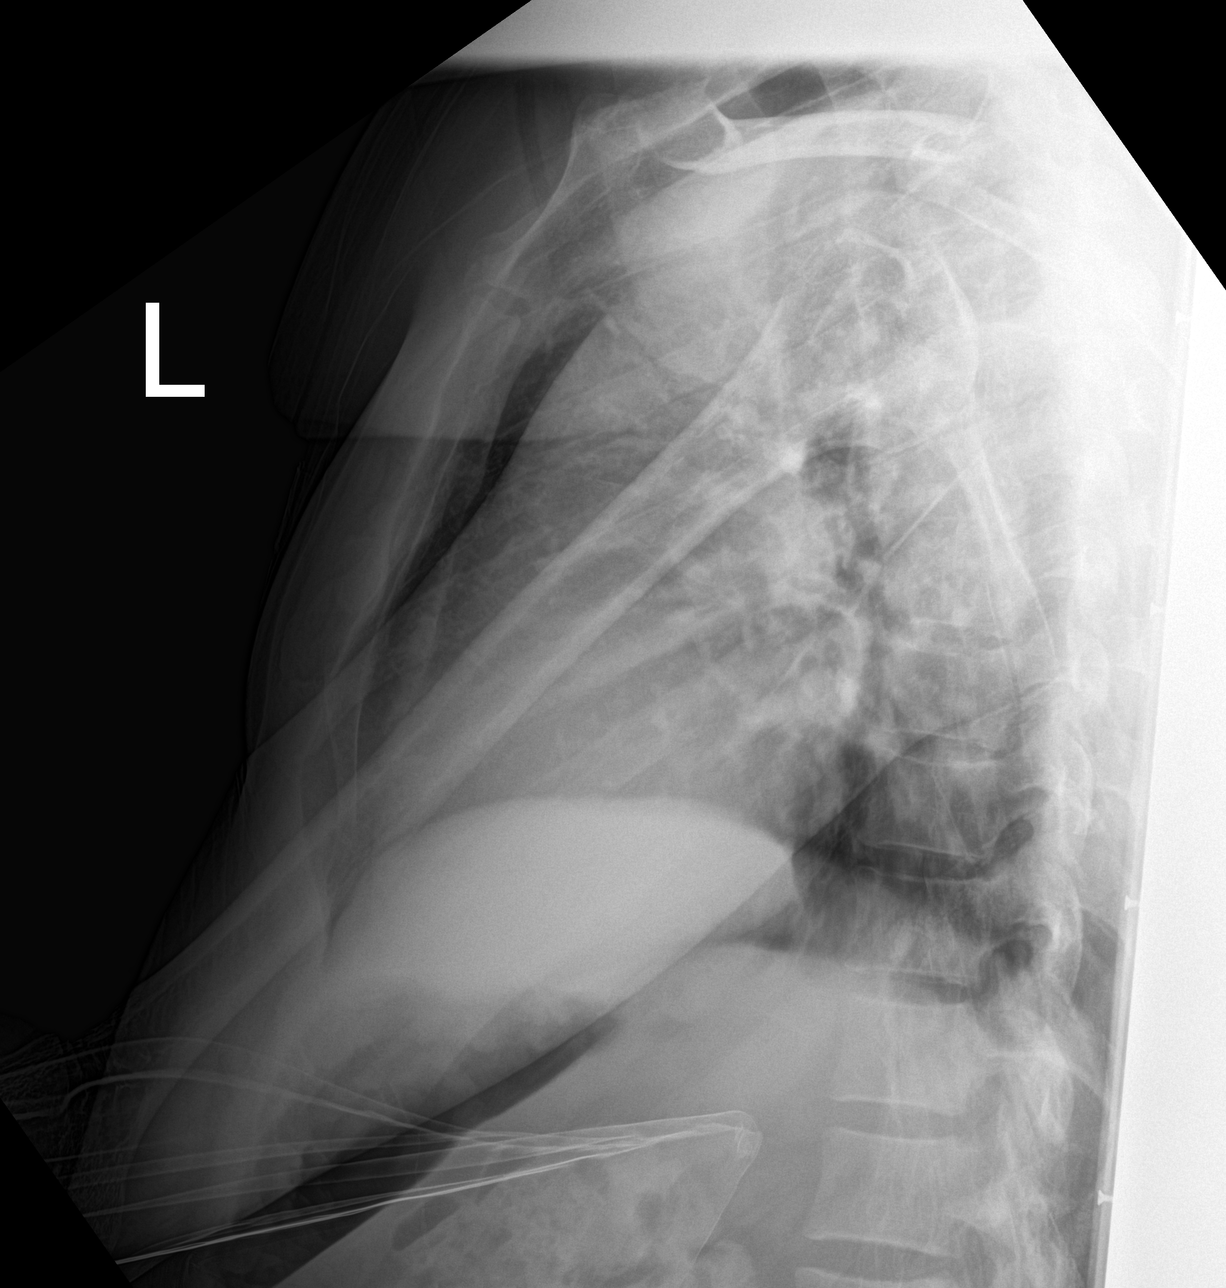

[chest ap]
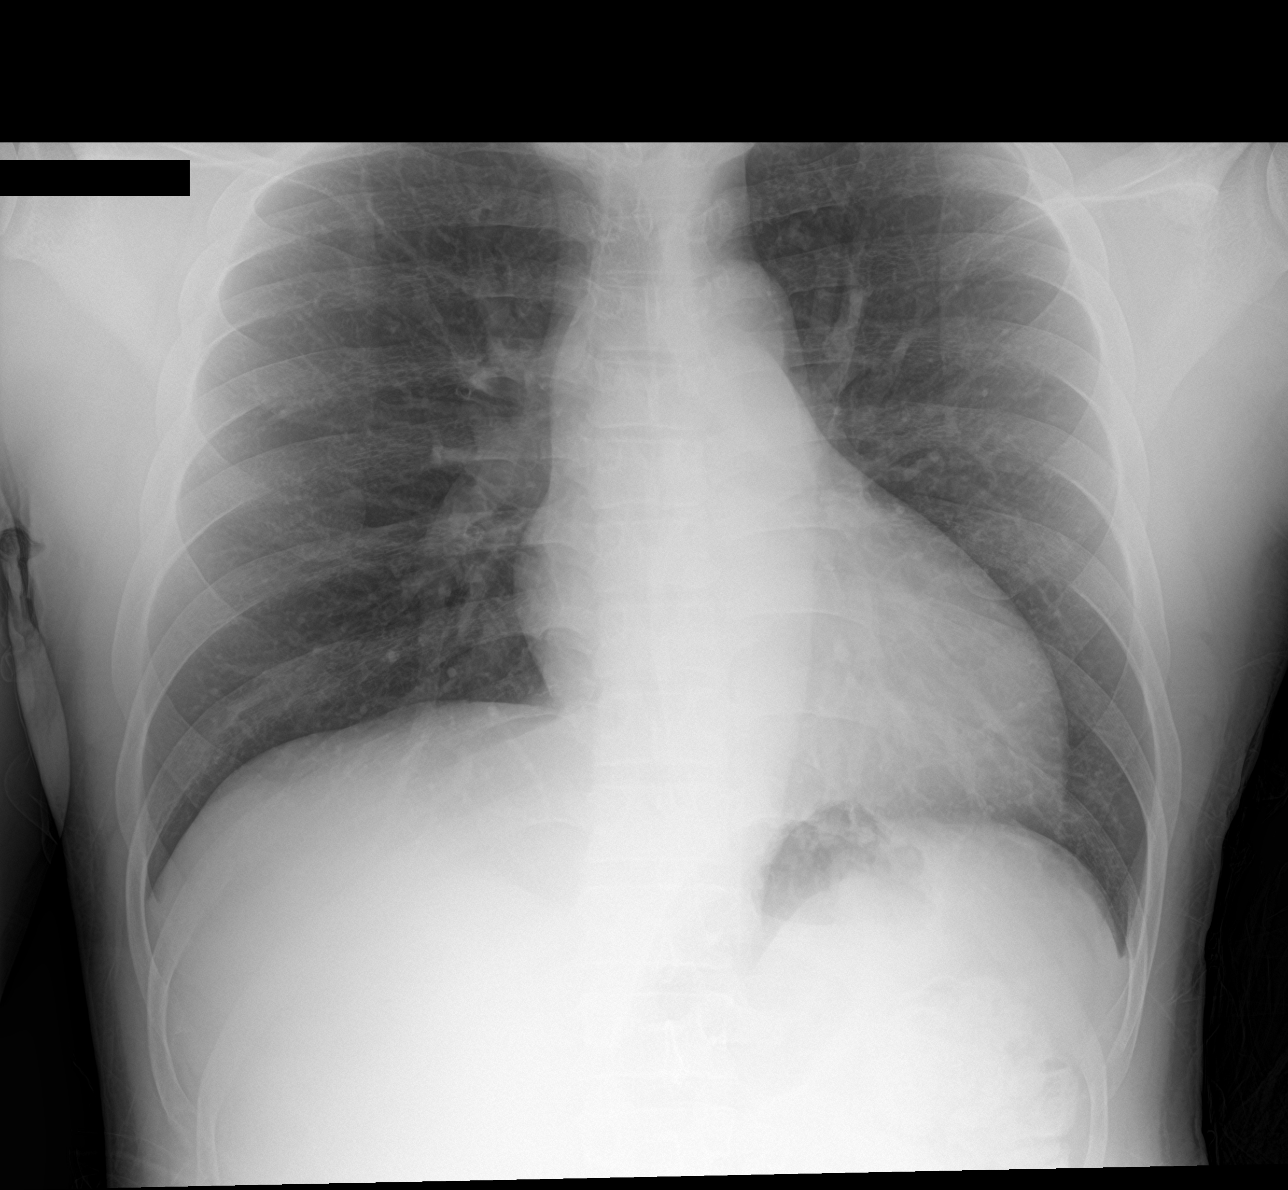

[chest lat (2 of 2)]
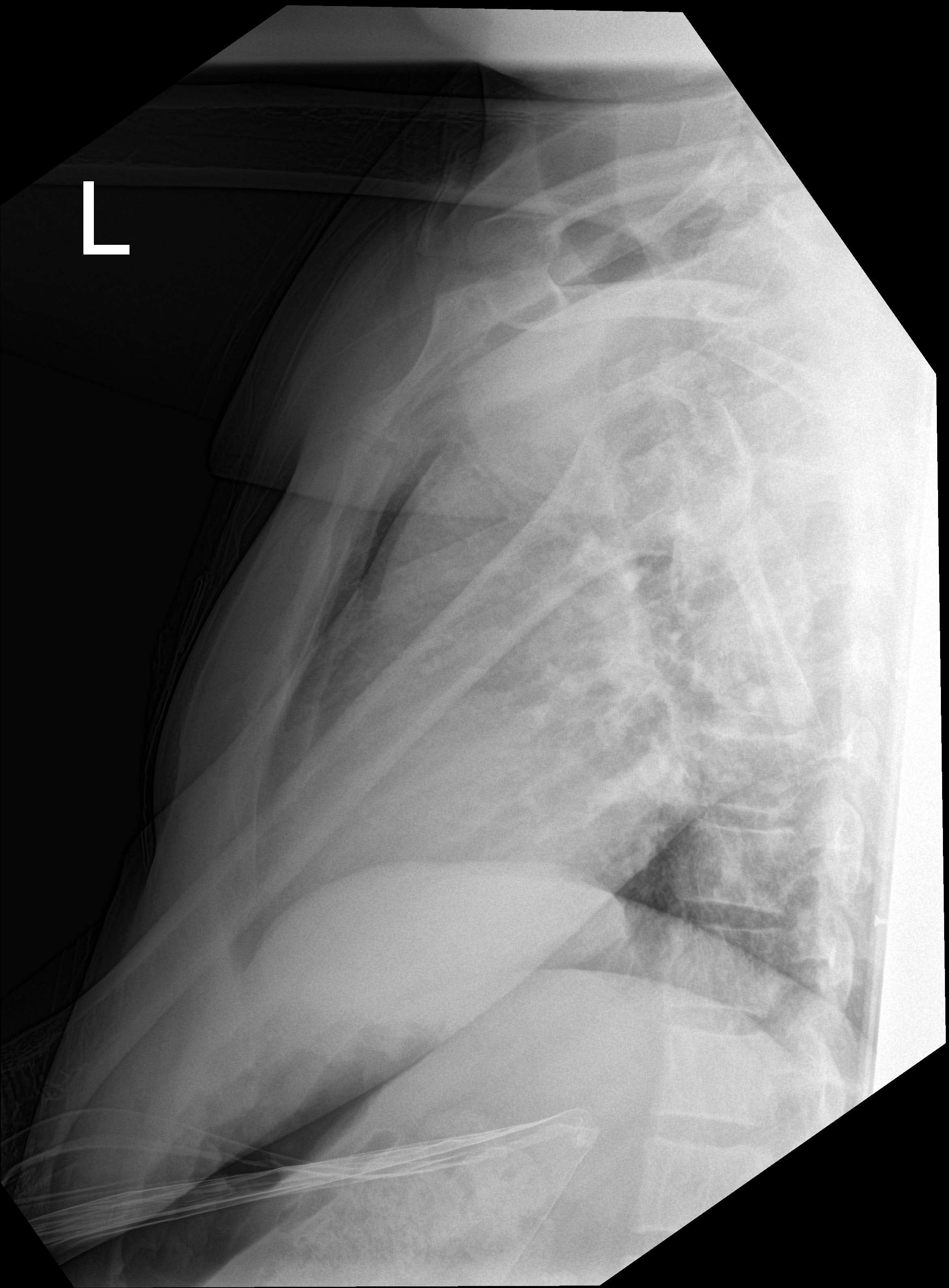

[3 of 3 positions shown; findings below may reference images not displayed]

FINDINGS: The heart size and mediastinal contours are within normal limits.
Both lungs are clear. The visualized skeletal structures are
unremarkable.
IMPRESSION: No active cardiopulmonary disease.

## 2021-05-18 ENCOUNTER — Ambulatory Visit: Payer: Managed Care, Other (non HMO) | Admitting: Physical Medicine & Rehabilitation

## 2021-05-18 ENCOUNTER — Encounter: Payer: Self-pay | Admitting: Family Medicine

## 2021-05-18 DIAGNOSIS — I10 Essential (primary) hypertension: Secondary | ICD-10-CM

## 2021-05-21 ENCOUNTER — Encounter: Payer: Self-pay | Admitting: Family Medicine

## 2021-05-21 MED ORDER — CARVEDILOL 25 MG PO TABS: 25.0000 mg | ORAL_TABLET | Freq: Two times a day (BID) | ORAL | 3 refills | Status: DC

## 2021-05-22 ENCOUNTER — Ambulatory Visit: Payer: Managed Care, Other (non HMO) | Admitting: Adult Health

## 2021-05-22 ENCOUNTER — Telehealth: Payer: Self-pay | Admitting: *Deleted

## 2021-05-22 ENCOUNTER — Encounter: Payer: Self-pay | Admitting: Adult Health

## 2021-05-22 VITALS — BP 118/79 | HR 86 | Ht 67.0 in | Wt 154.0 lb

## 2021-05-22 DIAGNOSIS — R569 Unspecified convulsions: Secondary | ICD-10-CM | POA: Diagnosis not present

## 2021-05-22 DIAGNOSIS — G8114 Spastic hemiplegia affecting left nondominant side: Secondary | ICD-10-CM | POA: Diagnosis not present

## 2021-05-22 DIAGNOSIS — I619 Nontraumatic intracerebral hemorrhage, unspecified: Secondary | ICD-10-CM | POA: Diagnosis not present

## 2021-05-22 DIAGNOSIS — I69398 Other sequelae of cerebral infarction: Secondary | ICD-10-CM | POA: Diagnosis not present

## 2021-05-22 NOTE — Progress Notes (Signed)
[facility] [address] [place]. [place] [address] [phone]       STROKE FOLLOW UP NOTE  [name] Date of Birth:  1980/11/16 Medical Record Number:  [id number]   Reason for Referral:  stroke follow up   Chief Complaint  Patient presents with   Follow-up    RM 3 alone Pt is well and stable, things have improved, went back to work FT. No new concerns       SUBJECTIVE:   HPI:   Update 05/22/2021 JM: Returns for follow-up stroke visit after prior visit approximately 8 months ago.  Overall stable from stroke standpoint -denies new stroke/TIA symptoms Left spastic hemiparesis - does report improvement. Walking over a 1 mile on the weekends without difficulty.  Completed therapies back in October and plans on restarting next year.  He continues to do exercises routinely at home.  He does report increased spasticity in the morning and with increased anxiety (coming to appointments).  Continues to use cane outdoors or longer distance. Hasn't been using cane indoors.  Receiving Botox injections by Dr. 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 Has been taking carvedilol only once a day as BP was going too low taking twice daily. He has f/u with PCP next month to further discuss  No new concerns at this time      History provided for reference purposes only Update 10/03/2020 JM: [name] returns for 41-month stroke follow-up unaccompanied  He has been doing well since prior visit with continued improvement of left spastic hemiparesis. Continues to work with neuro rehab PT/OT as well as receiving Botox injections by Dr.  Letta Pate.  He continues to ambulate with a cane and denies any recent falls.  He attempted to return back to his prior job with restricted hours but unfortunately they would not accept these restrictions.  He is currently in the process of looking for a different job and recently had an interview doing collision estimates.  Denies new or worsening stroke/TIA symptoms  He has remained on Keppra 500 mg twice daily without any seizure activity Remains on atorvastatin 40 mg daily without associated side effects - has f/u with PCP Thursday and plans on repeating lab work Blood pressure today 122/86 - checks blood pressure at home in the morning and evening prior to taking his medications and typically SBP 120s He is currently working with his PCP to decrease blood pressure medications He reports increased fatigue and lightheadedness shortly after taking morning and evening medications  No further concerns at this time  Update 05/30/2020 JM: [name] is being seen via MyChart virtual visit for stroke follow-up.  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  Currently receiving Botox injections by Dr. Letta Pate with some improvement and plans on receiving higher dosage around mid January. He has been ambulating with cane and short distance without cane without any recent falls. Return back to driving on graduated return to driving basis per Dr. Letta Pate and has not had any difficulty.  Denies ongoing cognitive difficulties.  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  He  reports discussing this further with his current employer who agreed to work with him with his restriction.  Denies new or worsening stroke/TIA symptoms.  Reports ongoing compliance with atorvastatin 40 mg daily.  Blood pressure stable at home per patient typically 120s/70s with ongoing use of lisinopril, carvedilol and amlodipine.  Remains on Keppra 500  mg twice daily without side effects and denies any seizure activity.  No further concerns at this time.   Initial visit 02/08/2020 JM: [name] is being seen for hospital follow-up accompanied by his wife. He was discharged home from CIR on 01/12/2020 after a 28-day stay. He has been making excellent progress with residual hemiparesis, cognitive impairment, and left facial droop Denies residual dysphagia or visual impairment Currently working with neuro rehab PT and speech with ongoing improvement Able to ambulate with RW short distance; w/c long distance Denies new or worsening stroke/TIA symptoms No recurrent seizure activity and remains on Keppra 500 mg twice daily tolerating well without side effects Continues on atorvastatin 40 mg daily without myalgias Blood pressure today 118/72 and remains on lisinopril, clonidine, carvedilol and amlodipine Monitors at home which has been stable Reports complete cessation of THC, tobacco and EtOH use No concerns  Stroke admission 12/06/2019 Mr. Baylin Dennis Mercado is a 40 y.o. male with recent HTN at dentist office with plans for PCP visit next week, who presented on 12/06/2019 post coitus with L sided weakness, and elevated BP. 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 Hypertensive emergency upon arrival with BP 119/162 treated with Cleviprex and placed on Coreg, amlodipine, hydralazine, clonidine and lisinopril. Seizure activity following extubation on 6/25 discharged on Keppra with EEG showing severe diffuse encephalopathy (likely post sedation). LDL 157 and initiate atorvastatin 40 mg daily. Other stroke risk factors include EtOH use and THC use but no prior stroke history. Residual deficits of dysphagia, right gaze preference, left hemianopia, left facial droop, and left hemiparesis. 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 IVH. Local mass effect w/o midline shift CTA head Unremarkable  CT head at 12h stable CT head 6/22 0440 stable R thalamic hemorrhage, IVH. Sllight increased in ventricle size. 4-5 midline shift CT head 6/22 remains stable    MRI 6/23 - Unchanged size of right thalamic ICH with mild surrounding edema. No hydrocephalus or ventricular entrapment.  CT Head 6/26 - No substantial change EEG - severe diffuse encephalopathy Carotid Doppler - unremarkable 2D Echo EF 60-65%. No source of embolus   LDL 157 HgbA1c 5.6  Heparin subq for VTE prophylaxis No antithrombotic prior to admission, now on No antithrombotic given hemorrhage  Therapy recommendations: CIR  Disposition:  CIR  Seizure  Agitation following extubation 6/25 L trapezius muscle contractions, extending to R trapezius, with subsequent Upper and lower extremity rigidity with rhythmic contraction. Pt unresponsive, upward deviated gaze. C/w seizure  S/p ativan On depakote 500 Q6h -> 750 Q8h -> off Now on keppra EEG severe diffuse encephalopathy Depakote level - 39        ROS:   14 system review of systems performed and negative with exception of those listed in HPI  PMH: History reviewed. No pertinent past medical history.  PSH:  Past Surgical History:  Procedure Laterality Date   WISDOM TOOTH EXTRACTION      Social History:  Social History   Socioeconomic History   Marital status: Married  Spouse name: Not on file   Number of children: Not on file   Years of education: Not on file   Highest education level: Not on file  Occupational History   Not on file  Tobacco Use   Smoking status: Never   Smokeless tobacco: Never  Vaping Use   Vaping Use: Never used  Substance and Sexual Activity   Alcohol use: Not Currently    Comment: several drinks over the weekend   Drug use: Never   Sexual activity: Not on file  Other Topics Concern   Not on file  Social History Narrative   Not on  file   Social Determinants of Health   Financial Resource Strain: Not on file  Food Insecurity: Not on file  Transportation Needs: Not on file  Physical Activity: Not on file  Stress: Not on file  Social Connections: Not on file  Intimate Partner Violence: Not on file    Family History: History reviewed. No pertinent family history.  Medications:   Current Outpatient Medications on File Prior to Visit  Medication Sig Dispense Refill   amLODipine (NORVASC) 10 MG tablet Take 1 tablet (10 mg total) by mouth daily. 90 tablet 1   atorvastatin (LIPITOR) 40 MG tablet Take 1 tablet (40 mg total) by mouth daily. 90 tablet 1   carvedilol (COREG) 25 MG tablet Take 1 tablet (25 mg total) by mouth 2 (two) times daily with a meal. (Patient taking differently: Take 25 mg by mouth daily.) 60 tablet 3   lisinopril (ZESTRIL) 5 MG tablet Take 1 tablet (5 mg total) by mouth daily. 90 tablet 1   No current facility-administered medications on file prior to visit.    Allergies:   Allergies  Allergen Reactions   Percocet [Oxycodone-Acetaminophen] Itching    Pt says he has previously tolerated.       OBJECTIVE: Today's Vitals   05/22/21 0733  BP: 118/79  Pulse: 86  Weight: 154 lb (69.9 kg)  Height: 5\' 7"  (1.702 m)    Body mass index is 24.12 kg/m.  General: well developed, well nourished, very pleasant young African-American male, seated, in no evident distress Head: head normocephalic and atraumatic.   Neck: supple with no carotid or supraclavicular bruits Cardiovascular: regular rate and rhythm, no murmurs Musculoskeletal: no deformity Skin:  no rash/petichiae Vascular:  Normal pulses all extremities   Neurologic Exam Mental Status: Awake and fully alert.  Fluent speech and language. Oriented to place and time. Recent and remote memory intact. Attention span, concentration and fund of knowledge appropriate. Mood and affect appropriate.  Cranial Nerves: Pupils equal, briskly  reactive to light. Extraocular movements full without nystagmus. Visual fields full to confrontation. Hearing intact. Facial sensation intact.  Left lower facial weakness.  Tongue, palate moves normally and symmetrically.  Motor: Normal strength, bulk and tone right upper and lower extremity LUE:4+/5 proximal, unable to test hand due to increased spasticity (typically worse in the morning) LLE: 4/5 ADF otherwise 5/5 Sensory.:  Mild decreased light touch and vibratory sensation left arm and leg compared to right side Coordination: Rapid alternating movements normal on right side. Finger-to-nose and heel-to-shin performed accurately on right side. Gait and Station: Arises from chair without difficulty. Stance is normal. Gait demonstrates  hemiplegic gait and circumduction step and use of cane.  Tandem walk and heel toe not attempted.  Reflexes: 1+ right side and brisk left side. Toes downgoing.        ASSESSMENT: Navin Dogan is a 40 y.o.  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 Also had one seizure event during admission while being extubated.  Vascular risk factors include HTN, HLD, and poststroke seizures     PLAN:  Hypertensive right thalamic ICH:  Residual deficit: Left spastic hemiparesis Continue to follow Dr. Dianah Field for Botox injections Continue HEP with plans on restarting therapies not sure Continue atorvastatin 40 mg daily for secondary stroke prevention.  No indication for antithrombotic as no prior stroke history or cardiovascular risk factors Discussed secondary stroke prevention measures and importance of close PCP follow up for aggressive stroke risk factor management  HTN: BP goal <130/90.  Stable on lisinopril, carvedilol and amlodipine per PCP  HLD: LDL goal <70.  Prior LDL 58 on atorvastatin 40 mg daily per PCP Seizures, poststroke: Only 1 seizure event while being  extubated during hospitalization in 11/2019 now off keppra since 11/2020 as no additional seizures and possible AED side effects.  No additional seizures since that time.   Discussed potential seizure symptoms and to call with any concerns    Per patient request, as he has been doing well and routinely follows with PCP, he may follow-up in office as needed   CC:  Libby Maw, MD     I spent 32 minutes of face-to-face and non-face-to-face time with patient.  This included previsit chart review, lab review, study review, electronic health record documentation, patient education regarding prior stroke and residual deficits, seizure activity post stroke now off keppra, importance of managing stroke risk factors and secondary stroke prevention measures and answered all other questions to patient satisfaction  Frann Rider, AGNP-BC  [facility] [address] [place], [place] [address]  Phone [phone] Fax [phone] Note: This document was prepared with digital dictation and possible smart phrase technology. Any transcriptional errors that result from this process are unintentional.

## 2021-05-22 NOTE — Patient Instructions (Signed)
Restart therapies next year and continue getting botox with Dr. Letta Pate   Continue  atorvastatin 40mg  daily  for secondary stroke prevention  Continue to follow up with PCP regarding blood pressure and cholesterol management  Maintain strict control of hypertension with blood pressure goal below 130/90 and cholesterol with LDL cholesterol (bad cholesterol) goal below 70 mg/dL.   Monitor for any seizure activity - if any concerns, please let me know        Thank you for coming to see [place] at [facility]. I hope we have been able to provide you high quality care today.  You may receive a patient satisfaction survey over the next few weeks. We would appreciate your feedback and comments so that we may continue to improve ourselves and the health of our patients.

## 2021-05-23 NOTE — Telephone Encounter (Signed)
Order for dysport to be shipped to our office was faxed to [place] per instructions for 1500 units dysport for spasticity for appt on 05/25/21.

## 2021-05-25 ENCOUNTER — Telehealth: Payer: Self-pay | Admitting: Physical Medicine & Rehabilitation

## 2021-05-25 ENCOUNTER — Encounter: Payer: Managed Care, Other (non HMO) | Admitting: Physical Medicine & Rehabilitation

## 2021-05-25 NOTE — Telephone Encounter (Signed)
Called notified patient we have not received the White bag from [employer]. They will not release Dysport until patient pays CoPay to the pharmacy.  Provided the [employer] number for Copay assistance. Patient will call to reschedule. States he wanted to call and talk to insurance.

## 2021-05-25 NOTE — Telephone Encounter (Signed)
For PT and OT needs new referral so he can get back in next year

## 2021-06-27 ENCOUNTER — Encounter: Payer: Self-pay | Admitting: Family Medicine

## 2021-06-27 ENCOUNTER — Other Ambulatory Visit: Payer: Self-pay

## 2021-06-27 ENCOUNTER — Ambulatory Visit: Payer: Managed Care, Other (non HMO) | Admitting: Family Medicine

## 2021-06-27 VITALS — BP 124/78 | HR 70 | Temp 97.5°F | Ht 67.0 in | Wt 155.8 lb

## 2021-06-27 DIAGNOSIS — E782 Mixed hyperlipidemia: Secondary | ICD-10-CM | POA: Diagnosis not present

## 2021-06-27 DIAGNOSIS — R7303 Prediabetes: Secondary | ICD-10-CM | POA: Diagnosis not present

## 2021-06-27 DIAGNOSIS — I10 Essential (primary) hypertension: Secondary | ICD-10-CM | POA: Diagnosis not present

## 2021-06-27 HISTORY — DX: Prediabetes: R73.03

## 2021-06-27 LAB — COMPREHENSIVE METABOLIC PANEL
ALT: 24 U/L (ref 0–53)
AST: 15 U/L (ref 0–37)
Albumin: 4.7 g/dL (ref 3.5–5.2)
Alkaline Phosphatase: 52 U/L (ref 39–117)
BUN: 21 mg/dL (ref 6–23)
CO2: 32 mEq/L (ref 19–32)
Calcium: 9.7 mg/dL (ref 8.4–10.5)
Chloride: 100 mEq/L (ref 96–112)
Creatinine, Ser: 1.24 mg/dL (ref 0.40–1.50)
GFR: 72.77 mL/min (ref >60.00)
Glucose, Bld: 94 mg/dL (ref 70–99)
Potassium: 4.5 mEq/L (ref 3.5–5.1)
Sodium: 137 mEq/L (ref 135–145)
Total Bilirubin: 0.5 mg/dL (ref 0.2–1.2)
Total Protein: 7.1 g/dL (ref 6.0–8.3)

## 2021-06-27 LAB — URINALYSIS, ROUTINE W REFLEX MICROSCOPIC
Bilirubin Urine: NEGATIVE
Hgb urine dipstick: NEGATIVE
Ketones, ur: NEGATIVE
Leukocytes,Ua: NEGATIVE
Nitrite: NEGATIVE
RBC / HPF: NONE SEEN (ref 0–?)
Specific Gravity, Urine: 1.015 (ref 1.000–1.030)
Total Protein, Urine: NEGATIVE
Urine Glucose: NEGATIVE
Urobilinogen, UA: 0.2 (ref 0.0–1.0)
pH: 7 (ref 5.0–8.0)

## 2021-06-27 LAB — CBC
HCT: 42.6 % (ref 39.0–52.0)
Hemoglobin: 13.5 g/dL (ref 13.0–17.0)
MCHC: 31.5 g/dL (ref 30.0–36.0)
MCV: 81.5 fl (ref 78.0–100.0)
Platelets: 297 10*3/uL (ref 150.0–400.0)
RBC: 5.23 Mil/uL (ref 4.22–5.81)
RDW: 14.9 % (ref 11.5–15.5)
WBC: 7.2 10*3/uL (ref 4.0–10.5)

## 2021-06-27 LAB — LDL CHOLESTEROL, DIRECT: Direct LDL: 71 mg/dL

## 2021-06-27 LAB — MICROALBUMIN / CREATININE URINE RATIO
Creatinine,U: 122.5 mg/dL
Microalb Creat Ratio: 1.2 mg/g (ref 0.0–30.0)
Microalb, Ur: 1.5 mg/dL (ref 0.0–1.9)

## 2021-06-27 LAB — HEMOGLOBIN A1C: Hgb A1c MFr Bld: 6.3 % (ref 4.6–6.5)

## 2021-06-27 NOTE — Progress Notes (Signed)
Established Patient Office Visit  Subjective:  Patient ID: Dennis Mercado, male    DOB: Jun 02, 1981  Age: 41 y.o. MRN: [id number]  CC:  Chief Complaint  Patient presents with   Follow-up    Follow up, no concerns.     HPI Dennis Mercado presents for follow-up of hypertension, mixed hyperlipidemia and prediabetes.  Blood pressure is controlled with amlodipine and lisinopril.  Cholesterol controlled with 40 mg of atorvastatin.  Continues physical therapy for left hemiparesis.  He is back at work full-time working the [employer] shift.  He works from home.  He is an Research scientist (life sciences).  Hemoglobin A1c has been elevating. He is non fasting today. Assures me that he has only one or two glasses of wine when he drinks.   No past medical history on file.  Past Surgical History:  Procedure Laterality Date   WISDOM TOOTH EXTRACTION      No family history on file.  Social History   Socioeconomic History   Marital status: Married    Spouse name: Not on file   Number of children: Not on file   Years of education: Not on file   Highest education level: Not on file  Occupational History   Not on file  Tobacco Use   Smoking status: Never   Smokeless tobacco: Never  Vaping Use   Vaping Use: Never used  Substance and Sexual Activity   Alcohol use: Not Currently    Comment: several drinks over the weekend   Drug use: Never   Sexual activity: Not on file  Other Topics Concern   Not on file  Social History Narrative   Not on file   Social Determinants of Health   Financial Resource Strain: Not on file  Food Insecurity: Not on file  Transportation Needs: Not on file  Physical Activity: Not on file  Stress: Not on file  Social Connections: Not on file  Intimate Partner Violence: Not on file    Outpatient Medications Prior to Visit  Medication Sig Dispense Refill   amLODipine (NORVASC) 10 MG tablet Take 1 tablet (10 mg total) by mouth daily. 90 tablet 1   atorvastatin (LIPITOR)  40 MG tablet Take 1 tablet (40 mg total) by mouth daily. 90 tablet 1   carvedilol (COREG) 25 MG tablet Take 1 tablet (25 mg total) by mouth 2 (two) times daily with a meal. (Patient taking differently: Take 25 mg by mouth daily.) 60 tablet 3   lisinopril (ZESTRIL) 5 MG tablet Take 1 tablet (5 mg total) by mouth daily. 90 tablet 1   No facility-administered medications prior to visit.    Allergies  Allergen Reactions   Percocet [Oxycodone-Acetaminophen] Itching    Pt says he has previously tolerated.     ROS Review of Systems  Constitutional: Negative.   HENT: Negative.    Eyes:  Negative for photophobia and visual disturbance.  Respiratory: Negative.    Cardiovascular: Negative.   Gastrointestinal: Negative.   Endocrine: Negative for polyphagia and polyuria.  Genitourinary: Negative.   Musculoskeletal:  Positive for gait problem.  Neurological:  Positive for weakness. Negative for speech difficulty.     Objective:    Physical Exam Vitals and nursing note reviewed.  Constitutional:      General: He is not in acute distress.    Appearance: Normal appearance. He is normal weight. He is not ill-appearing, toxic-appearing or diaphoretic.  HENT:     Head: Normocephalic and atraumatic.     Right Ear: External  ear normal.     Left Ear: External ear normal.     Mouth/Throat:     Mouth: Mucous membranes are moist.     Pharynx: Oropharynx is clear. No oropharyngeal exudate or posterior oropharyngeal erythema.  Eyes:     General: No scleral icterus.       Right eye: No discharge.        Left eye: No discharge.     Extraocular Movements: Extraocular movements intact.     Conjunctiva/sclera: Conjunctivae normal.     Pupils: Pupils are equal, round, and reactive to light.  Neck:     Vascular: No carotid bruit.  Cardiovascular:     Rate and Rhythm: Normal rate and regular rhythm.  Pulmonary:     Effort: Pulmonary effort is normal.     Breath sounds: Normal breath sounds.   Musculoskeletal:     Cervical back: No rigidity.  Lymphadenopathy:     Cervical: No cervical adenopathy.  Skin:    General: Skin is warm and dry.  Neurological:     Mental Status: He is alert.     Motor: Weakness present.  Psychiatric:        Mood and Affect: Mood normal.        Behavior: Behavior normal.    BP 124/78 (BP Location: Right Arm, Patient Position: Sitting, Cuff Size: Normal)    Pulse 70    Temp (!) 97.5 F (36.4 C) (Temporal)    Ht 5\' 7"  (1.702 m)    Wt 155 lb 12.8 oz (70.7 kg)    SpO2 97%    BMI 24.40 kg/m  Wt Readings from Last 3 Encounters:  06/27/21 155 lb 12.8 oz (70.7 kg)  05/22/21 154 lb (69.9 kg)  02/01/21 152 lb (68.9 kg)     Health Maintenance Due  Topic Date Due   Pneumococcal Vaccine 52-46 Years old (1 - PCV) Never done   FOOT EXAM  Never done   OPHTHALMOLOGY EXAM  Never done   Hepatitis C Screening  Never done    There are no preventive care reminders to display for this patient.  No results found for: TSH Lab Results  Component Value Date   WBC 6.6 10/05/2020   HGB 13.2 10/05/2020   HCT 40.8 10/05/2020   MCV 81.7 10/05/2020   PLT 294.0 10/05/2020   Lab Results  Component Value Date   NA 138 01/31/2021   K 4.2 01/31/2021   CO2 29 01/31/2021   GLUCOSE 91 01/31/2021   BUN 12 01/31/2021   CREATININE 1.23 01/31/2021   BILITOT 0.5 10/05/2020   ALKPHOS 63 10/05/2020   AST 15 10/05/2020   ALT 21 10/05/2020   PROT 7.5 10/05/2020   ALBUMIN 4.7 10/05/2020   CALCIUM 10.2 01/31/2021   ANIONGAP 10 01/10/2020   GFR 73.69 01/31/2021   Lab Results  Component Value Date   CHOL 106 10/05/2020   Lab Results  Component Value Date   HDL 37.50 (L) 10/05/2020   Lab Results  Component Value Date   LDLCALC 58 10/05/2020   Lab Results  Component Value Date   TRIG 55.0 10/05/2020   Lab Results  Component Value Date   CHOLHDL 3 10/05/2020   Lab Results  Component Value Date   HGBA1C 6.1 01/31/2021      Assessment & Plan:    Problem List Items Addressed This Visit       Cardiovascular and Mediastinum   Benign essential HTN - Primary   Relevant Orders  CBC   Comprehensive metabolic panel   Urinalysis, Routine w reflex microscopic   Microalbumin / creatinine urine ratio     Other   Prediabetes   Relevant Orders   Hemoglobin A1c   Urinalysis, Routine w reflex microscopic   Microalbumin / creatinine urine ratio   Other Visit Diagnoses     Mixed hyperlipidemia       Relevant Orders   LDL cholesterol, direct       No orders of the defined types were placed in this encounter.   Follow-up: No follow-ups on file.  Continue current meds. May need trxt for prediabetes.   Libby Maw, MD

## 2021-07-06 ENCOUNTER — Telehealth: Payer: Self-pay

## 2021-07-06 NOTE — Telephone Encounter (Signed)
Audrea Muscat, Pharmacist from [facility] called to clarify the dosage, refills and frequency of Dysport. Spoke with Renae and she informed me that he is to get 1500 units with no refills at the time. Informed her that frequency is every 3 months

## 2021-07-20 ENCOUNTER — Other Ambulatory Visit: Payer: Self-pay

## 2021-07-20 ENCOUNTER — Encounter
Payer: Managed Care, Other (non HMO) | Attending: Physical Medicine & Rehabilitation | Admitting: Physical Medicine & Rehabilitation

## 2021-07-20 ENCOUNTER — Telehealth: Payer: Self-pay | Admitting: Physical Medicine & Rehabilitation

## 2021-07-20 ENCOUNTER — Encounter: Payer: Self-pay | Admitting: Physical Medicine & Rehabilitation

## 2021-07-20 VITALS — BP 126/81 | HR 84 | Ht 67.0 in | Wt 156.2 lb

## 2021-07-20 DIAGNOSIS — G8114 Spastic hemiplegia affecting left nondominant side: Secondary | ICD-10-CM | POA: Diagnosis present

## 2021-07-20 NOTE — Progress Notes (Signed)
Dysport Injection for spasticity using needle EMG guidance  Dilution: 200 Units/ml Indication: Severe spasticity which interferes with ADL,mobility and/or  hygiene and is unresponsive to medication management and other conservative care Informed consent was obtained after describing risks and benefits of the procedure with the patient. This includes bleeding, bruising, infection, excessive weakness, or medication side effects. A REMS form is on file and signed. Needle:  needle electrode Number of units per muscle Biceps 400 units Brachiorad 100 units FDP 100 units FDS 100 units FCR 200 units FPL 100 units All injections were done after obtaining appropriate EMG activity and after negative drawback for blood. The patient tolerated the procedure well. Post procedure instructions were given. A followup appointment was made.

## 2021-07-20 NOTE — Telephone Encounter (Signed)
Contacted Pharmacy: [facility] at [phone] for directions for remaining 500 Units from 1 vial from this mornings injection. Per [place] pharmacist  States we should Store per [employer] until exp date they will not accept a return shipment

## 2021-07-20 NOTE — Telephone Encounter (Addendum)
All 1500 units were mixed with diluent and therefore 500 units must be wasted since MD only used 1000 units.

## 2021-07-20 NOTE — Patient Instructions (Signed)

## 2021-07-28 ENCOUNTER — Other Ambulatory Visit: Payer: Self-pay | Admitting: Family Medicine

## 2021-07-28 DIAGNOSIS — I10 Essential (primary) hypertension: Secondary | ICD-10-CM

## 2021-08-02 ENCOUNTER — Encounter: Payer: Self-pay | Admitting: Physical Therapy

## 2021-08-02 ENCOUNTER — Ambulatory Visit: Payer: Managed Care, Other (non HMO) | Attending: Physical Medicine & Rehabilitation | Admitting: Physical Therapy

## 2021-08-02 ENCOUNTER — Other Ambulatory Visit: Payer: Self-pay

## 2021-08-02 ENCOUNTER — Ambulatory Visit: Payer: Managed Care, Other (non HMO) | Admitting: Occupational Therapy

## 2021-08-02 DIAGNOSIS — G8114 Spastic hemiplegia affecting left nondominant side: Secondary | ICD-10-CM | POA: Diagnosis not present

## 2021-08-02 DIAGNOSIS — M6281 Muscle weakness (generalized): Secondary | ICD-10-CM | POA: Diagnosis present

## 2021-08-02 DIAGNOSIS — R2681 Unsteadiness on feet: Secondary | ICD-10-CM | POA: Insufficient documentation

## 2021-08-02 DIAGNOSIS — R29818 Other symptoms and signs involving the nervous system: Secondary | ICD-10-CM | POA: Diagnosis present

## 2021-08-02 DIAGNOSIS — R208 Other disturbances of skin sensation: Secondary | ICD-10-CM | POA: Diagnosis present

## 2021-08-02 DIAGNOSIS — I69354 Hemiplegia and hemiparesis following cerebral infarction affecting left non-dominant side: Secondary | ICD-10-CM

## 2021-08-02 DIAGNOSIS — R2689 Other abnormalities of gait and mobility: Secondary | ICD-10-CM

## 2021-08-02 NOTE — Therapy (Signed)
[facility] [address] [place], [place], [address] Phone: [phone]   Fax:  [phone]  Occupational Therapy Evaluation  Patient Details  Name: Dennis Mercado MRN: [id number] Date of Birth: March 02, 1981 Referring Provider (OT): Dr. Letta Pate   Encounter Date: 08/02/2021   OT End of Session - 08/02/21 0855     Visit Number 1    Number of Visits 12    Date for OT Re-Evaluation 10/25/21    Authorization Type cigna- POC written for 12 weeks, plan to d/c after 8 weeks    Authorization Time Period per patient 30 visit OT limit    Authorization - Visit Number 1    Progress Note Due on Visit 30    OT Start Time 0850    OT Stop Time 0930    OT Time Calculation (min) 40 min    Activity Tolerance Patient tolerated treatment well    Behavior During Therapy [facility] for tasks assessed/performed             No past medical history on file.  Past Surgical History:  Procedure Laterality Date   WISDOM TOOTH EXTRACTION      There were no vitals filed for this visit.   Subjective Assessment - 08/02/21 0853     Subjective  Pt denies pain, he reports he is back to work    Pertinent History Pt. 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 Pt required intubation 6/22 due to ARF secondary to aspiration PNA. Received inpatient rehab. Pt was discharged 7/28/2, pt returns to OT after botox on  07/20/21 by Dr. Letta Pate    Patient Stated Goals to be able to use my hand    Currently in Pain? No/denies               [facility] OT Assessment - 08/02/21 0856       Assessment   Medical Diagnosis R thalamic hemorrhage w/ L hemiparesis    Referring Provider (OT) Dr. Letta Pate    Onset Date/Surgical Date 07/20/21    Hand Dominance Right    Prior Therapy Previous PT/OT at  this location.      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    Leisure Playing with his dog      ADL   Eating/Feeding Modified independent    Grooming Modified independent    Upper Body Bathing Modified independent    [place] independent    [place] Body Dressing Modified independent   uses pull on shoes,   Toilet Transfer Modified independent    Tub/Shower Transfer Modified independent    ADL comments Pt reports he only uses LUE as a stabilizer/ gross assist 20% of the time      IADL   Shopping Shops independently for small purchases    Light Housekeeping Performs light daily tasks such as dishwashing, bed making    Meal Prep Able to complete simple warm meal prep    Medication Management Is responsible for taking medication in correct  dosages at correct time    Financial Management Manages financial matters independently (budgets, writes checks, pays rent, bills goes to bank), collects and keeps track of income   performs with wife     Mobility   Mobility Status --   mod ! with cane     Written Expression   Dominant Hand Right      Vision Assessment   Vision Assessment Vision not tested    Comment blurry vision with fine print      Cognition   Overall Cognitive Status Cognition to be further assessed in functional context PRN    Cognition Comments Pt has returned to work he and reports doing well      Sensation   Light Touch Not tested      Tone   Assessment Location Left Upper Extremity      AROM   Overall AROM  Deficits    Overall AROM Comments LUE shoulder flexion 105* with mod compensation and elbow bent elbow extenion -10, elbow flexion :125, supination/ pronation: 70%/ 90%,   pt maintains wrist at -20 flexion at rest, unable to actively extend, pt is able to actively flex wrist to 40*, finger flexion/ extension : pt is able to initiate grossly 10-20% A/ROM finger flexion and extension , pt maintains fingers in flexion at rest.      LUE Tone   LUE Tone Hypertonic      LUE Tone   Hypertonic Details moderate                              OT Education - 08/02/21 1257     Education Details chest press in seated and supine    Person(s) Educated Patient    Methods Explanation;Demonstration;Tactile cues;Verbal cues;Handout    Comprehension Verbalized understanding;Returned demonstration;Tactile cues required;Need further instruction              OT Short Term Goals - 08/02/21 1039       OT SHORT TERM GOAL #1   Title I with updated HEP    Time 6    Period Weeks    Status New      OT SHORT TERM GOAL #2   Title Pt will demonstrate reach to 60*shoulder flexion with -15 elbow extension and no more than min compensations,    Baseline shoulder flexion to 105* with mod compensations and  significant elbow flexion present    Time 6    Period Weeks    Status New      OT SHORT TERM GOAL #3   Title Pt will demonstrate A/ROM wrist extension to -10 for improved functional use.    Time 6    Period Weeks    Status New      OT SHORT TERM GOAL #4   Title I with positioning/ splint wear, care and precautions to  minimize risk for contracture.    Time 6    Period Weeks    Status New      OT SHORT TERM GOAL #5   Title Pt will incorporate LUE into functional activities as a stabilizer/ gross A at least 30% of the time.    Time 6    Period Weeks    Status New               OT Long Term Goals - 08/02/21 1043       OT LONG TERM GOAL #1   Title  Pt will use LUE as a stabilizer/ gross assist at least 40% of the time.    Baseline uses 20%    Time 12    Period Weeks    Status New    Target Date 10/25/21      OT LONG TERM GOAL #2    Title Patient will demonstrate reach to 70 degrees of shoulder flexion with -10 elbow extension and no more than min compensation    Time 12    Period Weeks    Status New      OT LONG TERM GOAL #3   Title Pt will demonstrate ability to actively release an item than has been placed in left hand 50% of the time.    Time 12    Period Weeks    Status New      OT LONG TERM GOAL #4   Title Patient will incorporate self stretching techniques throughout the day whether at home or work.    Time 12    Period Weeks    Status New      OT LONG TERM GOAL #5   Title Pt will demonstrate at least 80% A/ROM foream supination  following stretching in prep for improved functional use.    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 Letta Pate. Pt presents with the following deficits: spasticity, decreased ROM, decreased coordination, decreased LUE functional use, decreased functional mobility which impedes performance of ADLs/ IADLs. Pt can benefit from skilled occupational therapy to address these deficits in order to maximize pt's safety and I with daily activities.    OT Occupational Profile and History Detailed Assessment- Review of Records and additional review of physical, cognitive, psychosocial history related to current functional performance    Occupational performance deficits (Please refer to evaluation for details): ADL's;IADL's;Leisure;Social Participation;Work;Play    Body Structure / Function / Physical Skills ADL;Balance;Endurance;Mobility;Strength;UE functional use;FMC;Vision;Coordination;Gait;Decreased knowledge of precautions;GMC;ROM;Decreased knowledge of use of DME;Dexterity;IADL;Sensation;Tone;Body  Banker;Safety Awareness;Thought;Understand    Rehab Potential Good    Clinical Decision Making Limited treatment options, no task modification necessary    Comorbidities Affecting Occupational Performance: May have comorbidities impacting occupational performance    Modification or Assistance to Complete Evaluation  No modification of tasks or assist necessary to complete eval    OT Frequency 1x week x   OT Duration 12 weeks    OT Treatment/Interventions Self-care/ADL training;Therapeutic exercise;Functional Mobility Training;Balance training;Splinting;Manual Therapy;Neuromuscular education;Aquatic Therapy;Ultrasound;Energy conservation;Therapeutic activities;Cryotherapy;Paraffin;DME and/or AE instruction;Cognitive remediation/compensation;Visual/perceptual remediation/compensation;Gait Training;Fluidtherapy;Electrical Stimulation;Moist Heat;Contrast Bath;Passive range of motion;Patient/family education    Plan update HEP, consider splinting needs    Consulted and Agree with Plan of Care Patient             Patient will benefit from skilled therapeutic intervention in order to improve the following deficits and impairments:   Body Structure / Function / Physical Skills: ADL, Balance, Endurance, Mobility, Strength, UE functional use, FMC, Vision, Coordination, Gait, Decreased knowledge of precautions, GMC, ROM, Decreased knowledge of use of DME, Dexterity, IADL, Sensation, Tone, Body mechanics Cognitive Skills:  Attention, Memory, Problem Solve, Safety Awareness, Thought, Understand     Visit Diagnosis: Hemiplegia and hemiparesis following cerebral infarction affecting left non-dominant side ([place]) - Plan: Ot plan of care cert/re-cert  Unsteadiness on feet - Plan: Ot plan of care cert/re-cert  Muscle weakness (generalized) - Plan: Ot plan of care cert/re-cert  Other disturbances of skin sensation - Plan: Ot plan of care  cert/re-cert  Other symptoms and signs involving the nervous system - Plan: Ot plan of care cert/re-cert  Other abnormalities of gait and mobility - Plan: Ot plan of care cert/re-cert    Problem List Patient Active Problem List   Diagnosis Date Noted   Prediabetes 06/27/2021   Elevated glucose 10/13/2020   History of CVA (cerebrovascular accident) 01/19/2020   Normocytic anemia    Benign essential HTN    Hyponatremia    Acute blood loss anemia    Hypotension due to drugs    Vascular headache    Prerenal azotemia    Sleep disturbance    Leukocytosis    Transaminitis    Marijuana abuse    Essential hypertension    Dysphagia, post-stroke    Left hemiparesis (HCC)    Acute respiratory failure (HCC)    ICH (intracerebral hemorrhage) ([place]) 12/06/2019    Kiva Norland, OT 08/02/2021, 1:02 PM  [facility] [address] [place], [place], [address] Phone: [phone]   Fax:  [phone]  Name: Dennis Mercado MRN: [id number] Date of Birth: [birth year]

## 2021-08-02 NOTE — Therapy (Signed)
[facility] [address] [place], [place], [address] Phone: [phone]   Fax:  [phone]  Physical Therapy Evaluation  Patient Details  Name: Dennis Mercado MRN: [id number] Date of Birth: 1981-03-18 Referring Provider (PT): Letta Pate [name], MD   Encounter Date: 08/02/2021   PT End of Session - 08/02/21 0809     Visit Number 1    Number of Visits 9    Date for PT Re-Evaluation 10/31/21    Authorization Type [employer] VL: 60 visits PT, 30 OT - Hard limit.    Authorization - Visit Number 1    Authorization - Number of Visits 60    PT Start Time 0803    PT Stop Time 0845    PT Time Calculation (min) 42 min    Equipment Utilized During Treatment Gait belt    Activity Tolerance Patient tolerated treatment well    Behavior During Therapy WFL for tasks assessed/performed             History reviewed. No pertinent past medical history.  Past Surgical History:  Procedure Laterality Date   WISDOM TOOTH EXTRACTION      There were no vitals filed for this visit.    Subjective Assessment - 08/02/21 0805     Subjective Work is going really well. Has been able to stand and do some walking. Wants to try working on more walking without cane - going up and down curbs, going down inclines (going down to his mailbox and coming back up). No falls. Been walking a mile with April with his cane.    Pertinent History HTN    Limitations Walking    Patient Stated Goals More walking without the AD - going up and down inclines, up and down curbs.    Currently in Pain? No/denies                [facility] PT Assessment - 08/02/21 0813       Assessment   Medical Diagnosis R thalamic hemorrhage w/ L hemiparesis    Referring Provider (PT) Letta Pate [name], MD    Onset Date/Surgical Date 07/20/21   date of referral   Prior Therapy Previous PT/OT at this location.      Precautions   Precautions Fall      Balance Screen    Has the patient fallen in the past 6 months No    Has the patient had a decrease in activity level because of a fear of falling?  No    Is the patient reluctant to leave their home because of a fear of falling?  No      Home Environment   Living Environment Private residence    Living Arrangements Spouse/significant other    Type of [place] to enter    Entrance Stairs-Number of Steps 1   small 4" inch step - in both the front and the back   Entrance Stairs-Rails None    Home Layout One level    [place] Other (comment)   Cane with tripod tip     Prior Function   Level of Independence Independent    Vocation Full time employment    Dentist    Leisure Playing with his dog      Sensation   Light Touch Appears Intact      Coordination   Gross Motor Movements are Fluid and Coordinated No    Heel [employer]  Test WNL RLE, slower to perform with LLE      Tone   Assessment Location Left Lower Extremity      Strength   Right/Left Hip Right;Left    Right Hip Flexion 5/5    Left Hip Flexion 5/5    Right/Left Knee Right;Left    Right Knee Flexion 5/5    Right Knee Extension 5/5    Left Knee Flexion 4+/5    Left Knee Extension 5/5    Right/Left Ankle Right;Left    Right Ankle Dorsiflexion 5/5    Left Ankle Dorsiflexion 4/5      Transfers   Transfers Sit to Stand;Stand to Sit    Sit to Stand 5: Supervision    Five time sit to stand comments  15.78 seconds with no UE support, RLE staggered posteriorly.    Stand to Sit 5: Supervision    Comments 30 second chair stand: 10 with no UE support      Ambulation/Gait   Ambulation/Gait Yes    Ambulation/Gait Assistance 5: Supervision    Ambulation Distance (Feet) --   clinic distances   Assistive device None    Gait Pattern Step-through pattern;Decreased step length - left;Decreased stance time - left;Decreased hip/knee flexion - left;Decreased arm swing - left;Decreased weight  shift to left;Left circumduction;Wide base of support    Ambulation Surface Level;Indoor    Gait velocity 18.78 seconds = 1.74 ft/sec      Standardized Balance Assessment   Standardized Balance Assessment Dynamic Gait Index      Dynamic Gait Index   Level Surface Mild Impairment    Change in Gait Speed Mild Impairment    Gait with Horizontal Head Turns Mild Impairment    Gait with Vertical Head Turns Mild Impairment    Gait and Pivot Turn Mild Impairment    Step Over Obstacle Severe Impairment    Step Around Obstacles Mild Impairment    Steps Moderate Impairment    Total Score 13    DGI comment: performed whole test with no AD      Timed Up and Go Test   TUG Normal TUG    Normal TUG (seconds) 18.97   no AD     High Level Balance   High Level Balance Comments mCTSIB (slight space between feet): conditions 1-3 = 30 seconds, incr postural sway with condition 2, condition 4= 3 seconds      LUE Tone   LUE Tone Hypertonic      LLE Tone   LLE Tone Hypertonic                        Objective measurements completed on examination: See above findings.                PT Education - 08/02/21 1248     Education Details Clinical findings, POC.    Person(s) Educated Patient    Methods Explanation    Comprehension Verbalized understanding              PT Short Term Goals - 08/02/21 1729       PT SHORT TERM GOAL #1   Title Pt will decr TUG to 17.5 seconds or less in order to demo decr fall risk. ALL LTGS DUE 08/30/21    Baseline 18.97 seconds    Time 4    Period Weeks    Status New    Target Date 08/30/21      PT SHORT TERM GOAL #  2   Title Pt will ambulate over 100' of grass and unlevel surfaces with no AD and supervision in order to demo improved balance outdoors/in his yard.    Time 4    Period Weeks    Status New               PT Long Term Goals - 08/02/21 1731       PT LONG TERM GOAL #1   Title Pt will be independent with final  HEP for stretching, ROM, and balance. ALL LTGS DUE 09/27/21    Time 8    Period Weeks    Status New    Target Date 09/27/21      PT LONG TERM GOAL #2   Title Pt will perform a curb outdoors with no AD with supervision for improved community access.    Time 8    Period Weeks    Status New      PT LONG TERM GOAL #3   Title Pt will ambulate up/down inclines with supervision in order to demo improved gait with going in and out to his mailbox with no AD.    Time 8    Period Weeks    Status New      PT LONG TERM GOAL #4   Title Pt will improve gait speed to at least 1.9 ft/sec with no AD in order to demo decr fall risk.    Baseline 1.74 ft/sec    Time 8    Period Weeks    Status New      PT LONG TERM GOAL #5   Title Pt will improve condition 4 of mCTSIB to at least 12 seconds in order to demo improved vestibular input for balance.    Baseline condition 4= 3 seconds (feet slightly apart)    Time 8    Period Weeks    Status New                    Plan - 08/02/21 1734     Clinical Impression Statement Patient is a 41 year old male referred to Neuro OPPT for left spastic hemiplegia.   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 The following deficits were present during the exam: impaired balance, decr RLE strength, decr RLE ROM, increased RLE spasticity, gait abnormalities, impaired coordination. Based on TUG, gait speed, DGI pt is an incr risk for falls (all performed with no AD). Pt is using no AD for household distances and for some distances outdoors (sometimes with his wife's supervision). Pt would benefit from skilled PT to address these impairments and functional limitations to maximize functional mobility independence and decr all risk.    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    Examination-Activity Limitations  Stand;Stairs;Squat;Transfers;Locomotion Level    Examination-Participation Restrictions Community Activity   walking his dog   Stability/Clinical Decision Making Stable/Uncomplicated    Clinical Decision Making Low    Rehab Potential Good    PT Frequency 1x / week    PT Duration 12 weeks    PT Treatment/Interventions ADLs/Self Care Home Management;Aquatic Therapy;Electrical Stimulation;DME Instruction;Gait training;Stair training;Functional mobility training;Therapeutic activities;Therapeutic exercise;Balance training;Neuromuscular re-education;Wheelchair mobility training;Orthotic Fit/Training;Patient/family education;Passive range of motion;Energy conservation;Vestibular    PT Next Visit Plan Look at previous HEP and update to add balance on unlevel surfaces for home (will need to condense it from last bout of therapy). Might be easier to start  brand new. Work on gait with no AD, stepping over obstacles, going up/down curbs, gait up/down inclines. Balance on unlevel surfaces, SLS tasks with LLE    PT Home Exercise Plan [email or web address]    Consulted and Agree with Plan of Care Patient             Patient will benefit from skilled therapeutic intervention in order to improve the following deficits and impairments:  Abnormal gait, Decreased activity tolerance, Decreased balance, Decreased mobility, Decreased knowledge of use of DME, Decreased coordination, Decreased range of motion, Decreased strength, Impaired tone, Postural dysfunction, Difficulty walking, Impaired flexibility, Decreased endurance  Visit Diagnosis: Hemiplegia and hemiparesis following cerebral infarction affecting left non-dominant side (HCC)  Unsteadiness on feet  Muscle weakness (generalized)  Other abnormalities of gait and mobility     Problem List Patient Active Problem List   Diagnosis Date Noted   Prediabetes 06/27/2021   Elevated glucose 10/13/2020   History of CVA (cerebrovascular accident) 01/19/2020    Normocytic anemia    Benign essential HTN    Hyponatremia    Acute blood loss anemia    Hypotension due to drugs    Vascular headache    Prerenal azotemia    Sleep disturbance    Leukocytosis    Transaminitis    Marijuana abuse    Essential hypertension    Dysphagia, post-stroke    Left hemiparesis (HCC)    Acute respiratory failure (HCC)    ICH (intracerebral hemorrhage) ([place]) 12/06/2019    Arliss Journey, PT, DPT  08/03/2021, 7:33 AM  [facility] [address] [place], [place], [address] Phone: [phone]   Fax:  [phone]  Name: Dennis Mercado MRN: [id number] Date of Birth: Apr 07, 1981

## 2021-08-08 ENCOUNTER — Encounter: Payer: Self-pay | Admitting: Physical Therapy

## 2021-08-08 ENCOUNTER — Ambulatory Visit: Payer: Managed Care, Other (non HMO) | Admitting: Occupational Therapy

## 2021-08-08 ENCOUNTER — Ambulatory Visit: Payer: Managed Care, Other (non HMO) | Admitting: Physical Therapy

## 2021-08-08 ENCOUNTER — Other Ambulatory Visit: Payer: Self-pay

## 2021-08-08 DIAGNOSIS — R2681 Unsteadiness on feet: Secondary | ICD-10-CM

## 2021-08-08 DIAGNOSIS — M6281 Muscle weakness (generalized): Secondary | ICD-10-CM

## 2021-08-08 DIAGNOSIS — I69354 Hemiplegia and hemiparesis following cerebral infarction affecting left non-dominant side: Secondary | ICD-10-CM | POA: Diagnosis not present

## 2021-08-08 DIAGNOSIS — R29818 Other symptoms and signs involving the nervous system: Secondary | ICD-10-CM

## 2021-08-08 DIAGNOSIS — R2689 Other abnormalities of gait and mobility: Secondary | ICD-10-CM

## 2021-08-08 DIAGNOSIS — R208 Other disturbances of skin sensation: Secondary | ICD-10-CM

## 2021-08-08 NOTE — Patient Instructions (Signed)
Access Code: [email or web address] URL: [email or web address] Date: 08/08/2021 Prepared by: Willow Ora  Program Notes Next to your couch on the floor:    -in tall kneeling:  -mini squats going down to left side and coming back to middle x10 reps -staying on left side gently stepping right leg out and in (really focus on shifting to your left side) x10 reps   -in half kneeling with left leg forwards practice gently rocking forwards and back  -can try bringing right leg forwards and back to middle if April is next to you    Exercises Seated Hamstring Stretch (Mirrored) - 1 x daily - 5 x weekly - 3 reps - 30 hold Sit to/from Stand in Stride position - 1 x daily - 5 x weekly - 1 sets - 10 reps Forward Step Up - 1 x daily - 5 x weekly - 1 sets - 10 reps Forward Step Down with Counter Support at Side - 1 x daily - 5 x weekly - 1 sets - 10 reps Standing Balance in Corner with Eyes Closed - 1 x daily - 5 x weekly - 1 sets - 3 reps - 30 seconds hold Standing in corner with eyes closed - 1 x daily - 5 x weekly - 1 sets - 10 reps

## 2021-08-08 NOTE — Therapy (Signed)
[facility] [address] [place], [place], [address] Phone: [phone]   Fax:  [phone]  Physical Therapy Treatment  Patient Details  Name: Dennis Mercado MRN: [id number] Date of Birth: August 30, 1980 Referring Provider (PT): [name], MD   Encounter Date: 08/08/2021   PT End of Session - 08/08/21 0806     Visit Number 2    Number of Visits 9    Date for PT Re-Evaluation 10/31/21    Authorization Type [employer] VL: 60 visits PT, 30 OT - Hard limit.    Authorization - Visit Number 2    Authorization - Number of Visits 60    PT Start Time 0802    PT Stop Time [id number]    PT Time Calculation (min) 40 min    Equipment Utilized During Treatment Gait belt    Activity Tolerance Patient tolerated treatment well    Behavior During Therapy [facility] for tasks assessed/performed             History reviewed. No pertinent past medical history.  Past Surgical History:  Procedure Laterality Date   WISDOM TOOTH EXTRACTION      There were no vitals filed for this visit.   Subjective Assessment - 08/08/21 0805     Subjective No new complaints. No falls or pain to report.    Pertinent History HTN    Limitations Walking    Patient Stated Goals More walking without the AD - going up and down inclines, up and down curbs.    Currently in Pain? No/denies                    [facility] Adult PT Treatment/Exercise - 08/08/21 0832       Transfers   Transfers Sit to Stand;Stand to Sit    Sit to Stand 5: Supervision    Stand to Sit 5: Supervision      Ambulation/Gait   Ambulation/Gait Yes    Ambulation/Gait Assistance 5: Supervision    Ambulation/Gait Assistance Details no balance issues noted with decreased stance on left LE with short step length on right.    Ambulation Distance (Feet) --   around clinic with session   Assistive device None    Gait Pattern Step-through pattern;Decreased step length - left;Decreased  stance time - left;Decreased hip/knee flexion - left;Decreased arm swing - left;Decreased weight shift to left;Left circumduction;Wide base of support    Ambulation Surface Level;Indoor      Exercises   Exercises Other Exercises    Other Exercises  revised HEP from previous episode of care to focus on left LE strengthening and balance on compliant surfaces. Refer to [place] for full details. Min guard assist for safety with standing ex's.            Issued to HEP this session: Access Code: [email or web address] URL: [email or web address] Date: 08/08/2021 Prepared by: Willow Ora  Verbally reviewed these from previous bout of therapy and kept on HEP Program Notes Next to your couch on the floor:    -in tall kneeling:  -mini squats going down to left side and coming back to middle x10 reps -staying on left side gently stepping right leg out and in (really focus on shifting to your left side) x10 reps   -in half kneeling with left leg forwards practice gently rocking forwards and back  -can try bringing right leg forwards and back to middle if April is next to you   Issued below this session with  performance in session: Exercises Seated Hamstring Stretch (Mirrored) - 1 x daily - 5 x weekly - 3 reps - 30 hold Sit to/from Stand in Stride position - 1 x daily - 5 x weekly - 1 sets - 10 reps Forward Step Up - 1 x daily - 5 x weekly - 1 sets - 10 reps Forward Step Down with Counter Support at Side - 1 x daily - 5 x weekly - 1 sets - 10 reps Standing Balance in Corner with Eyes Closed - 1 x daily - 5 x weekly - 1 sets - 3 reps - 30 seconds hold Standing in corner with eyes closed - 1 x daily - 5 x weekly - 1 sets - 10 reps         PT Education - 08/08/21 2103     Education Details updated HEP    Person(s) Educated Patient    Methods Explanation;Demonstration;Verbal cues;Handout    Comprehension Verbalized understanding;Returned demonstration;Verbal cues required;Need further  instruction              PT Short Term Goals - 08/02/21 1729       PT SHORT TERM GOAL #1   Title Pt will decr TUG to 17.5 seconds or less in order to demo decr fall risk. ALL LTGS DUE 08/30/21    Baseline 18.97 seconds    Time 4    Period Weeks    Status New    Target Date 08/30/21      PT SHORT TERM GOAL #2   Title Pt will ambulate over 100' of grass and unlevel surfaces with no AD and supervision in order to demo improved balance outdoors/in his yard.    Time 4    Period Weeks    Status New               PT Long Term Goals - 08/02/21 1731       PT LONG TERM GOAL #1   Title Pt will be independent with final HEP for stretching, ROM, and balance. ALL LTGS DUE 09/27/21    Time 8    Period Weeks    Status New    Target Date 09/27/21      PT LONG TERM GOAL #2   Title Pt will perform a curb outdoors with no AD with supervision for improved community access.    Time 8    Period Weeks    Status New      PT LONG TERM GOAL #3   Title Pt will ambulate up/down inclines with supervision in order to demo improved gait with going in and out to his mailbox with no AD.    Time 8    Period Weeks    Status New      PT LONG TERM GOAL #4   Title Pt will improve gait speed to at least 1.9 ft/sec with no AD in order to demo decr fall risk.    Baseline 1.74 ft/sec    Time 8    Period Weeks    Status New      PT LONG TERM GOAL #5   Title Pt will improve condition 4 of mCTSIB to at least 12 seconds in order to demo improved vestibular input for balance.    Baseline condition 4= 3 seconds (feet slightly apart)    Time 8    Period Weeks    Status New  Plan - 08/08/21 0806     Clinical Impression Statement Today's skilled session focused on revision of HEP to address left LE strengthening and balance on compliant surfaces. No issues noted or reported with performance in session. The pt is making steady progress and should benefit from continued PT  to progress toward unmet goals.    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    Examination-Activity Limitations Stand;Stairs;Squat;Transfers;Locomotion Level    Examination-Participation Restrictions Community Activity   walking his dog   Stability/Clinical Decision Making Stable/Uncomplicated    Rehab Potential Good    PT Frequency 1x / week    PT Duration 12 weeks    PT Treatment/Interventions ADLs/Self Care Home Management;Aquatic Therapy;Electrical Stimulation;DME Instruction;Gait training;Stair training;Functional mobility training;Therapeutic activities;Therapeutic exercise;Balance training;Neuromuscular re-education;Wheelchair mobility training;Orthotic Fit/Training;Patient/family education;Passive range of motion;Energy conservation;Vestibular    PT Next Visit Plan Work on gait with no AD, stepping over obstacles, going up/down curbs, gait up/down inclines. Balance on unlevel surfaces, SLS tasks with LLE    PT Home Exercise Plan [email or web address]    Consulted and Agree with Plan of Care Patient             Patient will benefit from skilled therapeutic intervention in order to improve the following deficits and impairments:  Abnormal gait, Decreased activity tolerance, Decreased balance, Decreased mobility, Decreased knowledge of use of DME, Decreased coordination, Decreased range of motion, Decreased strength, Impaired tone, Postural dysfunction, Difficulty walking, Impaired flexibility, Decreased endurance  Visit Diagnosis: Hemiplegia and hemiparesis following cerebral infarction affecting left non-dominant side (HCC)  Unsteadiness on feet  Other abnormalities of gait and mobility  Muscle weakness (generalized)     Problem List Patient Active Problem List   Diagnosis Date Noted   Prediabetes 06/27/2021   Elevated glucose 10/13/2020   History of CVA (cerebrovascular accident)  01/19/2020   Normocytic anemia    Benign essential HTN    Hyponatremia    Acute blood loss anemia    Hypotension due to drugs    Vascular headache    Prerenal azotemia    Sleep disturbance    Leukocytosis    Transaminitis    Marijuana abuse    Essential hypertension    Dysphagia, post-stroke    Left hemiparesis ([place])    Acute respiratory failure (HCC)    ICH (intracerebral hemorrhage) ([place]) 12/06/2019    Willow Ora, PTA, [facility] Outpatient Neuro [facility] [address] [place], [place] [address] [phone] 08/08/21, 9:07 PM   Name: Dennis Mercado MRN: [id number] Date of Birth: 07/11/1980

## 2021-08-08 NOTE — Patient Instructions (Signed)
Shoulder Push-Up (Prone on Elbows)    With elbows placed under shoulders, rise up on elbows as high as possible. Keep hips on surface and back arched. Hold __3__ seconds. Repeat __10__ times. Do __1__ sessions per day.  Copyright  VHI. All rights reserved.    SCAPULA: Retraction    Hold cane with both hands. Pinch shoulder blades together. Do not shrug shoulders. Hold __5_ seconds  use cane __10_ reps per set, __1_ sets per day, ___7 days per week  Copyright  VHI. All rights reserved.   Shoulder: Flexion (Supine)    With hands shoulder width apart, slowly lower dowel to floor behind head. Do not let elbows bend. Keep back flat. Have left hand on handle of cane facing inward  Copyright  VHI. All rights reserved.   Hold __5__ seconds. Repeat __10__ times. Do __1__ sessions per day. CAUTION: Stretch slowly and gently.  Copyright  VHI. All rights reserved.    ELBOW: Extension / Chest Press (Frame)    Lie on back with knees bent. Straighten elbows to raise frame. Hold _5__ seconds. Use cane. __10_ reps per set, __1_ sets per day, __7_ days per week Use steering wheel or hula hoop instead of frame.

## 2021-08-09 NOTE — Therapy (Signed)
[facility] [address] [place], [place], [address] Phone: [phone]   Fax:  [phone]  Occupational Therapy Treatment  Patient Details  Name: Dennis Mercado MRN: [id number] Date of Birth: [birth year] Referring Provider (OT): Dr. Letta Pate   Encounter Date: 08/08/2021   OT End of Session - 08/09/21 0835     Visit Number 2    Number of Visits 12    Date for OT Re-Evaluation 10/25/21    Authorization Type cigna- POC written for 12 weeks, plan to d/c after 8 weeks    Authorization Time Period per patient 30 visit OT limit    Authorization - Visit Number 2    Authorization - Number of Visits 30   per year   OT Start Time 0850    OT Stop Time 0930    OT Time Calculation (min) 40 min    Activity Tolerance Patient tolerated treatment well    Behavior During Therapy [facility] for tasks assessed/performed             No past medical history on file.  Past Surgical History:  Procedure Laterality Date   WISDOM TOOTH EXTRACTION      There were no vitals filed for this visit.   Subjective Assessment - 08/09/21 0828     Subjective  Pt denies pain,    Pertinent History Pt. presented to ED on 12/06/2019 with left side weakness and  altered mental status s/p coitus.  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 Pt required intubation 6/22 due to ARF secondary to aspiration PNA. Received inpatient rehab. Pt was discharged 7/28/2, pt returns to OT after botox on  07/20/21 by Dr. Letta Pate    Patient Stated Goals to be able to use my hand    Currently in Pain? No/denies                     Treatment: Supine closed chin shoulder flexion and chest press with cane, sidelying on left side while performing A/ROM elbow flexion/ extension and supination pronation, min v.c / facilitation Quardaped / tall kneeling weightbearing through  ball to roll forwards and backwards, min-mod facilitation/ v.c              OT Education - 08/09/21 0835     Education Details Pt was instructed in supdated HEP, see pt instructions    Person(s) Educated Patient    Methods Explanation;Demonstration;Tactile cues;Verbal cues;Handout    Comprehension Verbalized understanding;Returned demonstration;Tactile cues required;Need further instruction              OT Short Term Goals - 08/02/21 1039       OT SHORT TERM GOAL #1   Title I with updated HEP    Time 6    Period Weeks    Status New      OT SHORT TERM GOAL #2   Title Pt will demonstrate reach to 60*shoulder flexion with -15 elbow extension and no more than min compensations,    Baseline shoulder flexion to 105* with mod compensations and  significant elbow flexion present    Time 6    Period Weeks    Status New      OT SHORT TERM GOAL #3   Title Pt will demonstrate A/ROM wrist extension to -10 for improved functional use.    Time 6    Period Weeks    Status New  OT SHORT TERM GOAL #4   Title I with positioning/ splint wear, care and precautions to  minimize risk for contracture.    Time 6    Period Weeks    Status New      OT SHORT TERM GOAL #5   Title Pt will incorporate LUE into functional activities as a stabilizer/ gross A at least 30% of the time.    Time 6    Period Weeks    Status New               OT Long Term Goals - 08/02/21 1043       OT LONG TERM GOAL #1   Title Pt will use LUE as a stabilizer/ gross assist at least 40% of the time.    Baseline uses 20%    Time 12    Period Weeks    Status New    Target Date 10/25/21      OT LONG TERM GOAL #2   Title Patient will demonstrate reach to 70 degrees of shoulder flexion with -10 elbow extension and no more than min compensation    Time 12    Period Weeks    Status New      OT LONG TERM GOAL #3   Title Pt will demonstrate ability to actively release an item than has been  placed in left hand 50% of the time.    Time 12    Period Weeks    Status New      OT LONG TERM GOAL #4   Title Patient will incorporate self stretching techniques throughout the day whether at home or work.    Time 12    Period Weeks    Status New      OT LONG TERM GOAL #5   Title Pt will demonstrate at least 80% A/ROM foream supination  following stretching in prep for improved functional use.    Time 12    Period Weeks    Status New      OT LONG TERM GOAL #6   Title --                   Plan - 08/09/21 7673     Clinical Impression Statement Pt is progressing towards goals. He demonstrates improved ROM in LUE and decreased spasticity at end of session.    OT Occupational Profile and History Detailed Assessment- Review of Records and additional review of physical, cognitive, psychosocial history related to current functional performance    Occupational performance deficits (Please refer to evaluation for details): ADL's;IADL's;Leisure;Social Participation;Work;Play    Body Structure / Function / Physical Skills ADL;Balance;Endurance;Mobility;Strength;UE functional use;FMC;Vision;Coordination;Gait;Decreased knowledge of precautions;GMC;ROM;Decreased knowledge of use of DME;Dexterity;IADL;Sensation;Tone;Body Banker;Safety Awareness;Thought;Understand    Rehab Potential Good    Clinical Decision Making Limited treatment options, no task modification necessary    Comorbidities Affecting Occupational Performance: May have comorbidities impacting occupational performance    Modification or Assistance to Complete Evaluation  No modification of tasks or assist necessary to complete eval    OT Frequency 1x / week    OT Duration 12 weeks    OT Treatment/Interventions Self-care/ADL training;Therapeutic exercise;Functional Mobility Training;Balance training;Splinting;Manual Therapy;Neuromuscular education;Aquatic  Therapy;Ultrasound;Energy conservation;Therapeutic activities;Cryotherapy;Paraffin;DME and/or AE instruction;Cognitive remediation/compensation;Visual/perceptual remediation/compensation;Gait Training;Fluidtherapy;Electrical Stimulation;Moist Heat;Contrast Bath;Passive range of motion;Patient/family education    Plan NMR, consider splinting needs    Consulted and Agree with Plan of Care Patient  Patient will benefit from skilled therapeutic intervention in order to improve the following deficits and impairments:   Body Structure / Function / Physical Skills: ADL, Balance, Endurance, Mobility, Strength, UE functional use, FMC, Vision, Coordination, Gait, Decreased knowledge of precautions, GMC, ROM, Decreased knowledge of use of DME, Dexterity, IADL, Sensation, Tone, Body mechanics Cognitive Skills: Attention, Memory, Problem Solve, Safety Awareness, Thought, Understand     Visit Diagnosis: Hemiplegia and hemiparesis following cerebral infarction affecting left non-dominant side (HCC)  Unsteadiness on feet  Muscle weakness (generalized)  Other disturbances of skin sensation  Other symptoms and signs involving the nervous system    Problem List Patient Active Problem List   Diagnosis Date Noted   Prediabetes 06/27/2021   Elevated glucose 10/13/2020   History of CVA (cerebrovascular accident) 01/19/2020   Normocytic anemia    Benign essential HTN    Hyponatremia    Acute blood loss anemia    Hypotension due to drugs    Vascular headache    Prerenal azotemia    Sleep disturbance    Leukocytosis    Transaminitis    Marijuana abuse    Essential hypertension    Dysphagia, post-stroke    Left hemiparesis (HCC)    Acute respiratory failure (HCC)    ICH (intracerebral hemorrhage) ([place]) 12/06/2019    Ronalee Scheunemann, OT 08/09/2021, 8:39 AM  [facility] [address] [place], [place], [address] Phone:  [phone]   Fax:  [phone]  Name: Dennis Mercado MRN: [id number] Date of Birth: 01-15-1981

## 2021-08-13 ENCOUNTER — Encounter: Payer: Self-pay | Admitting: Family Medicine

## 2021-08-14 ENCOUNTER — Telehealth (INDEPENDENT_AMBULATORY_CARE_PROVIDER_SITE_OTHER): Payer: Managed Care, Other (non HMO) | Admitting: Family Medicine

## 2021-08-14 ENCOUNTER — Encounter: Payer: Self-pay | Admitting: Family Medicine

## 2021-08-14 VITALS — Ht 67.0 in | Wt 156.0 lb

## 2021-08-14 DIAGNOSIS — R7303 Prediabetes: Secondary | ICD-10-CM | POA: Diagnosis not present

## 2021-08-14 NOTE — Progress Notes (Signed)
Established Patient Office Visit  Subjective:  Patient ID: Dennis Mercado, male    DOB: Nov 19, 1980  Age: 41 y.o. MRN: [id number]  CC:  Chief Complaint  Patient presents with   Advice Only    Discuss labs    HPI Dennis Mercado presents for a discussion of his elevating hemoglobin A1c.  He would like to know more about the test.  He would like to avoid medication at this time.  He is planning consultation with a nutritionist.  Remains difficult for him to exercise aerobically secondary to left-sided hemiparesis.  History reviewed. No pertinent past medical history.  Past Surgical History:  Procedure Laterality Date   WISDOM TOOTH EXTRACTION      History reviewed. No pertinent family history.  Social History   Socioeconomic History   Marital status: Married    Spouse name: Not on file   Number of children: Not on file   Years of education: Not on file   Highest education level: Not on file  Occupational History   Not on file  Tobacco Use   Smoking status: Never   Smokeless tobacco: Never  Vaping Use   Vaping Use: Never used  Substance and Sexual Activity   Alcohol use: Not Currently    Comment: several drinks over the weekend   Drug use: Never   Sexual activity: Not on file  Other Topics Concern   Not on file  Social History Narrative   Not on file   Social Determinants of Health   Financial Resource Strain: Not on file  Food Insecurity: Not on file  Transportation Needs: Not on file  Physical Activity: Not on file  Stress: Not on file  Social Connections: Not on file  Intimate Partner Violence: Not on file    Outpatient Medications Prior to Visit  Medication Sig Dispense Refill   amLODipine (NORVASC) 10 MG tablet Take 1 tablet (10 mg total) by mouth daily. 90 tablet 1   atorvastatin (LIPITOR) 40 MG tablet Take 1 tablet (40 mg total) by mouth daily. 90 tablet 1   lisinopril (ZESTRIL) 5 MG tablet TAKE 1 TABLET(5 MG) BY MOUTH DAILY 90 tablet 1    carvedilol (COREG) 25 MG tablet Take 1 tablet (25 mg total) by mouth 2 (two) times daily with a meal. (Patient not taking: Reported on 08/14/2021) 60 tablet 3   tadalafil (CIALIS) 5 MG tablet Take 5 mg by mouth daily as needed.     No facility-administered medications prior to visit.    Allergies  Allergen Reactions   Percocet [Oxycodone-Acetaminophen] Itching    Pt says he has previously tolerated.     ROS Review of Systems  Constitutional: Negative.   Endocrine: Negative for polyphagia and polyuria.  Genitourinary: Negative.   Neurological:  Positive for weakness.  Psychiatric/Behavioral: Negative.       Objective:    Physical Exam Vitals and nursing note reviewed.  Constitutional:      General: He is not in acute distress.    Appearance: Normal appearance.  HENT:     Head: Normocephalic and atraumatic.  Pulmonary:     Effort: Pulmonary effort is normal.  Neurological:     Mental Status: He is oriented to person, place, and time.  Psychiatric:        Mood and Affect: Mood normal.        Behavior: Behavior normal.    Ht 5\' 7"  (1.702 m)    Wt 156 lb (70.8 kg)    BMI 24.43  kg/m  Wt Readings from Last 3 Encounters:  08/14/21 156 lb (70.8 kg)  07/20/21 156 lb 3.2 oz (70.9 kg)  06/27/21 155 lb 12.8 oz (70.7 kg)     Health Maintenance Due  Topic Date Due   FOOT EXAM  Never done   OPHTHALMOLOGY EXAM  Never done   Hepatitis C Screening  Never done    There are no preventive care reminders to display for this patient.  No results found for: TSH Lab Results  Component Value Date   WBC 7.2 06/27/2021   HGB 13.5 06/27/2021   HCT 42.6 06/27/2021   MCV 81.5 06/27/2021   PLT 297.0 06/27/2021   Lab Results  Component Value Date   NA 137 06/27/2021   K 4.5 06/27/2021   CO2 32 06/27/2021   GLUCOSE 94 06/27/2021   BUN 21 06/27/2021   CREATININE 1.24 06/27/2021   BILITOT 0.5 06/27/2021   ALKPHOS 52 06/27/2021   AST 15 06/27/2021   ALT 24 06/27/2021   PROT 7.1  06/27/2021   ALBUMIN 4.7 06/27/2021   CALCIUM 9.7 06/27/2021   ANIONGAP 10 01/10/2020   GFR 72.77 06/27/2021   Lab Results  Component Value Date   CHOL 106 10/05/2020   Lab Results  Component Value Date   HDL 37.50 (L) 10/05/2020   Lab Results  Component Value Date   LDLCALC 58 10/05/2020   Lab Results  Component Value Date   TRIG 55.0 10/05/2020   Lab Results  Component Value Date   CHOLHDL 3 10/05/2020   Lab Results  Component Value Date   HGBA1C 6.3 06/27/2021      Assessment & Plan:   Problem List Items Addressed This Visit       Other   Prediabetes - Primary    No orders of the defined types were placed in this encounter. We discussed the meaning of the hemoglobin A1c test.  Explained that metformin could be used to prevent the onset of diabetes as well as treat diabetes.  He is still reluctant to take the medication.  He would like to adjust his diet first.  He will be more agreeable to starting the medication if his hemoglobin A1c does not decrease over the next 3 months.    Follow-up: Return in about 3 months (around 11/11/2021).    Libby Maw, MD  Virtual Visit via Video Note  I connected with Dennis Mercado on 08/14/21 at  8:20 AM EST by a video enabled telemedicine application and verified that I am speaking with the correct person using two identifiers.  Location: Patient: home alone.  Provider: work   I discussed the limitations of evaluation and management by telemedicine and the availability of in person appointments. The patient expressed understanding and agreed to proceed.  History of Present Illness:    Observations/Objective:   Assessment and Plan:   Follow Up Instructions:    I discussed the assessment and treatment plan with the patient. The patient was provided an opportunity to ask questions and all were answered. The patient agreed with the plan and demonstrated an understanding of the instructions.   The  patient was advised to call back or seek an in-person evaluation if the symptoms worsen or if the condition fails to improve as anticipated.  I provided 20 minutes of non-face-to-face time during this encounter.   Libby Maw, MD

## 2021-08-16 ENCOUNTER — Encounter: Payer: Self-pay | Admitting: Physical Therapy

## 2021-08-16 ENCOUNTER — Ambulatory Visit: Payer: Managed Care, Other (non HMO) | Attending: Family Medicine | Admitting: Physical Therapy

## 2021-08-16 ENCOUNTER — Encounter: Payer: Self-pay | Admitting: Occupational Therapy

## 2021-08-16 ENCOUNTER — Ambulatory Visit: Payer: Managed Care, Other (non HMO) | Admitting: Occupational Therapy

## 2021-08-16 ENCOUNTER — Other Ambulatory Visit: Payer: Self-pay

## 2021-08-16 DIAGNOSIS — R29818 Other symptoms and signs involving the nervous system: Secondary | ICD-10-CM

## 2021-08-16 DIAGNOSIS — R2689 Other abnormalities of gait and mobility: Secondary | ICD-10-CM | POA: Diagnosis present

## 2021-08-16 DIAGNOSIS — R414 Neurologic neglect syndrome: Secondary | ICD-10-CM | POA: Diagnosis present

## 2021-08-16 DIAGNOSIS — I69354 Hemiplegia and hemiparesis following cerebral infarction affecting left non-dominant side: Secondary | ICD-10-CM

## 2021-08-16 DIAGNOSIS — R208 Other disturbances of skin sensation: Secondary | ICD-10-CM | POA: Insufficient documentation

## 2021-08-16 DIAGNOSIS — R2681 Unsteadiness on feet: Secondary | ICD-10-CM | POA: Insufficient documentation

## 2021-08-16 DIAGNOSIS — M6281 Muscle weakness (generalized): Secondary | ICD-10-CM | POA: Insufficient documentation

## 2021-08-16 NOTE — Therapy (Signed)
?OUTPATIENT PHYSICAL THERAPY TREATMENT NOTE ? ? ?Patient Name: Dennis Mercado ?MRN: [id number] ?DOB:1980-06-21, 41 y.o., male ?Today's Date: 08/16/2021 ? ?PCP: Libby Maw, MD ?REFERRING PROVIDER: Libby Maw,* ? ? PT End of Session - 08/16/21 [phone]   ? ? Visit Number 3   ? Number of Visits 9   ? Date for PT Re-Evaluation 10/31/21   ? Authorization Type [employer] VL: 60 visits PT, 30 OT - Hard limit.   ? Authorization - Visit Number 3   ? Authorization - Number of Visits 60   ? PT Start Time [phone]   ? PT Stop Time [phone]   ? PT Time Calculation (min) 40 min   ? Equipment Utilized During Treatment Gait belt   ? Activity Tolerance Patient tolerated treatment well   ? Behavior During Therapy [facility] for tasks assessed/performed   ? ?  ?  ? ?  ? ? ?History reviewed. No pertinent past medical history. ?Past Surgical History:  ?Procedure Laterality Date  ? WISDOM TOOTH EXTRACTION    ? ?Patient Active Problem List  ? Diagnosis Date Noted  ? Prediabetes 06/27/2021  ? Elevated glucose 10/13/2020  ? History of CVA (cerebrovascular accident) 01/19/2020  ? Normocytic anemia   ? Benign essential HTN   ? Hyponatremia   ? Acute blood loss anemia   ? Hypotension due to drugs   ? Vascular headache   ? Prerenal azotemia   ? Sleep disturbance   ? Leukocytosis   ? Transaminitis   ? Marijuana abuse   ? Essential hypertension   ? Dysphagia, post-stroke   ? Left hemiparesis ([place])   ? Acute respiratory failure ([place])   ? ICH (intracerebral hemorrhage) ([place]) 12/06/2019  ? ? ?REFERRING DIAG: R thalamic hemorrhage w/ L hemiparesis  ? ?THERAPY DIAG:  ?Hemiplegia and hemiparesis following cerebral infarction affecting left non-dominant side ([place]) ? ?Unsteadiness on feet ? ?Muscle weakness (generalized) ? ?Other disturbances of skin sensation ? ?Other abnormalities of gait and mobility ? ?PERTINENT HISTORY: No pertinent past medical history.  ? ?PRECAUTIONS: Fall  ? ?SUBJECTIVE: No new complaints. No falls or pain to report. Has been doing  the HEP with the airex at home, still challenging.  ? ?PAIN:  ?Are you having pain? No ? ? ? ? ?TODAY'S TREATMENT:  ?08/16/21:  ?STRENGTHENING; ?Scifit level  LE's only level 4.0 x 8 minutes for strengthening and activity tolerance. ?With 4 inch step in parallel bars- left foot on box for step up with contralateral march with right LE<>back to floor for 10 reps, then with both feet on box- right forward step down/back up for 10 reps with single UE support ? ? ?GAIT: ?Gait pattern: step through pattern, decreased arm swing- Left, decreased step length- Right, decreased stance time- Left, decreased stride length, decreased hip/knee flexion- Left, and decreased trunk rotation ?Distance walked: around clinic with session ?Assistive device utilized: Single point cane and None ?Level of assistance: SBA and CGA ?Comments: use of cane to enter/exit session. No device used in session with min guard assist for safety.  Cues for posture and increased step/stride length with gait.  ? ? ?CURB: SBA and CGA. Bloc practices with use of arerobic step in parallel bars with single UE support progressing to no UE support, reminder cues on sequencing.  ? ? ?BALANCE: ?Side Stepping: on blue foam beam for 3 laps toward each side with cues on posture, step length/height and posture with light UE support on bars. Min guard assist for safety. ?Tandem  Walking: on blue foam beam for 3 laps forward<>backward with light support on bars. Cues for posture and step placement on beam. Min guard assist for safety.  ? ? ?PATIENT EDUCATION: ?Education details: keep going with current HEP ?Person educated: Spouse ?Education method: Explanation, Demonstration, and Verbal cues ?Education comprehension: verbalized understanding, returned demonstration, and verbal cues required ? ? ?HOME EXERCISE PROGRAM: ?Access code [email or web address] ? ? PT Short Term Goals - 08/16/21 0808   ? ?  ? PT SHORT TERM GOAL #1  ? Title Pt will decr TUG to 17.5 seconds or less in order to  demo decr fall risk. ALL LTGS DUE 08/30/21   ? Baseline 18.97 seconds   ? Time 4   ? Period Weeks   ? Status New   ? Target Date 08/30/21   ?  ? PT SHORT TERM GOAL #2  ? Title Pt will ambulate over 100' of grass and unlevel surfaces with no AD and supervision in order to demo improved balance outdoors/in his yard.   ? Time 4   ? Period Weeks   ? Status New   ? ?  ?  ? ?  ? ? ? PT Long Term Goals - 08/16/21 0808   ? ?  ? PT LONG TERM GOAL #1  ? Title Pt will be independent with final HEP for stretching, ROM, and balance. ALL LTGS DUE 09/27/21   ? Time 8   ? Period Weeks   ? Status New   ? Target Date 09/27/21   ?  ? PT LONG TERM GOAL #2  ? Title Pt will perform a curb outdoors with no AD with supervision for improved community access.   ? Time 8   ? Period Weeks   ? Status New   ?  ? PT LONG TERM GOAL #3  ? Title Pt will ambulate up/down inclines with supervision in order to demo improved gait with going in and out to his mailbox with no AD.   ? Time 8   ? Period Weeks   ? Status New   ?  ? PT LONG TERM GOAL #4  ? Title Pt will improve gait speed to at least 1.9 ft/sec with no AD in order to demo decr fall risk.   ? Baseline 1.74 ft/sec   ? Time 8   ? Period Weeks   ? Status New   ?  ? PT LONG TERM GOAL #5  ? Title Pt will improve condition 4 of mCTSIB to at least 12 seconds in order to demo improved vestibular input for balance.   ? Baseline condition 4= 3 seconds (feet slightly apart)   ? Time 8   ? Period Weeks   ? Status New   ? ?  ?  ? ?  ? ? ? Plan - 08/16/21 5462   ? ? Clinical Impression Statement Today's skilled session continued to focus on strengthening and gait/barriers with no AD. Spent increased time working on Water engineer with progressing to no UE support with min guard to supervision. No issues noted or reported in session. The pt is making steady progress toward goals and should benefit from continued PT to progress toward unmet goals.   ? Personal Factors and Comorbidities Comorbidity 1;Past/Current  Experience;Profession   ? Comorbidities HTN, right thalamic hemorrhage 12/06/2019   ? Examination-Activity Limitations Stand;Stairs;Squat;Transfers;Locomotion Level   ? Examination-Participation Restrictions Community Activity   walking his dog  ? Stability/Clinical Decision Making Stable/Uncomplicated   ? Rehab  Potential Good   ? PT Frequency 1x / week   ? PT Duration 12 weeks   ? PT Treatment/Interventions ADLs/Self Care Home Management;Aquatic Therapy;Electrical Stimulation;DME Instruction;Gait training;Stair training;Functional mobility training;Therapeutic activities;Therapeutic exercise;Balance training;Neuromuscular re-education;Wheelchair mobility training;Orthotic Fit/Training;Patient/family education;Passive range of motion;Energy conservation;Vestibular   ? PT Next Visit Plan Work on gait with no AD, stepping over obstacles, going up/down curbs, gait up/down inclines. Balance on unlevel surfaces, SLS tasks with LLE   ? PT Home Exercise Plan [email or web address]   ? Consulted and Agree with Plan of Care Patient   ? ?  ?  ? ?  ? ? ? ? ?Willow Ora, PTA, CLT ?[facility] [address] ?[place], [place] [address] ?[phone] ?08/16/21, 3:39 PM  ? ?   ?

## 2021-08-16 NOTE — Patient Instructions (Signed)
Your Splint ?This splint should initially be fitted by a healthcare practitioner.  The healthcare practitioner is responsible for providing wearing instructions and precautions to the patient, other healthcare practitioners and care provider involved in the patient's care.  This splint was custom made for you. Please read the following instructions to learn about wearing and caring for your splint. ? ?Precautions ?Should your splint cause any of the following problems, remove the splint immediately and contact your therapist/physician. ?Swelling ?Severe Pain ?Pressure Areas ?Stiffness ?Numbness ? ?Do not wear your splint while operating machinery unless it has been fabricated for that purpose. ? ?When To Wear Your Splint ?Where your splint according to your therapist/physician instructions. ?Daytime for 1-2 hoursinitally, stop wearing if any problems or pressure areas,  ?if no problems gradually increase wear time to 3-4 hours at a time during day. If no issues after 1 week, progress to night time wear. ? ?Care and Cleaning of Your Splint ?Keep your splint away from open flames. ?Your splint will lose its shape in temperatures over 135 degrees Farenheit, ( in car windows, near radiators, ovens or in hot water).  Never make any adjustments to your splint, if the splint needs adjusting remove it and make an appointment to see your therapist. ?Your splint, including the cushion liner may be cleaned with soap and lukewarm water.  Do not immerse in hot water over 135 degrees Farenheit. ?Straps may be washed with soap and water, but do not moisten the self-adhesive portion. ?

## 2021-08-16 NOTE — Therapy (Signed)
[facility] [address] ?[place], [place], [address] ?Phone: [phone]   Fax:  [phone] ? ?Occupational Therapy Treatment ? ?Patient Details  ?Name: Dennis Mercado ?MRN: [id number] ?Date of Birth: 04-Jul-1980 ?Referring Provider (OT): Dr. Letta Pate ? ? ?Encounter Date: 08/16/2021 ? ? OT End of Session - 08/16/21 1012   ? ? Visit Number 3   ? Number of Visits 12   ? Date for OT Re-Evaluation 10/25/21   ? Authorization Type cigna- POC written for 12 weeks, plan to d/c after 8 weeks   ? Authorization Time Period per patient 30 visit OT limit   ? Authorization - Visit Number 3   ? Authorization - Number of Visits 30   per year  ? Progress Note Due on Visit 30   ? OT Start Time 0848   ? OT Stop Time 0930   ? OT Time Calculation (min) 42 min   ? Activity Tolerance Patient tolerated treatment well   ? Behavior During Therapy [facility] for tasks assessed/performed   ? ?  ?  ? ?  ? ? ?History reviewed. No pertinent past medical history. ? ?Past Surgical History:  ?Procedure Laterality Date  ? WISDOM TOOTH EXTRACTION    ? ? ?There were no vitals filed for this visit. ? ? Subjective Assessment - 08/16/21 1011   ? ? Subjective  Pt denies pain,   ? Pertinent History Pt. presented to ED on 12/06/2019 with left side weakness and  altered mental status s/p coitus.  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 Pt required intubation 6/22 due to ARF secondary to aspiration PNA. Received inpatient rehab. Pt was discharged 7/28/2, pt returns to OT after botox on  07/20/21 by Dr. Letta Pate   ? Limitations HTN, left inattention, apraxia   ? Patient Stated Goals to be able to use my hand   ? Currently in Pain? No/denies   ? ?  ?  ? ?  ? ? ? ? ? ? ?treatment; Therapist fabricated a resting hand splint for improved LUE postioning. Pt performed gentle self stretching while therapist fabricated splint(  chest press with cane and table slides). ?Therapist reviewed application and precautions with pt. He demonstrates understanding. ? ? ? ? ? ? ? ? ? ? ? ? ? ? ? ? OT Education - 08/16/21 1011   ? ? Education Details resting hand splint wear, care and precuations, pt to wear during daytime only,  until next  OT visit   ? Person(s) Educated Patient   ? Methods Explanation;Demonstration;Verbal cues;Handout   ? Comprehension Verbalized understanding;Returned demonstration;Verbal cues required   ? ?  ?  ? ?  ? ? ? OT Short Term Goals - 08/02/21 1039   ? ?  ? OT SHORT TERM GOAL #1  ? Title I with updated HEP   ? Time 6   ? Period Weeks   ? Status New   ?  ? OT SHORT TERM GOAL #2  ? Title Pt will demonstrate reach to 60*shoulder flexion with -15 elbow extension and no more than min compensations,   ? Baseline shoulder flexion to 105* with mod compensations and  significant elbow flexion present   ? Time 6   ? Period Weeks   ? Status New   ?  ? OT SHORT TERM GOAL #3  ? Title Pt will demonstrate A/ROM wrist extension to -10 for improved functional  use.   ? Time 6   ? Period Weeks   ? Status New   ?  ? OT SHORT TERM GOAL #4  ? Title I with positioning/ splint wear, care and precautions to  minimize risk for contracture.   ? Time 6   ? Period Weeks   ? Status New   ?  ? OT SHORT TERM GOAL #5  ? Title Pt will incorporate LUE into functional activities as a stabilizer/ gross A at least 30% of the time.   ? Time 6   ? Period Weeks   ? Status New   ? ?  ?  ? ?  ? ? ? ? OT Long Term Goals - 08/02/21 1043   ? ?  ? OT LONG TERM GOAL #1  ? Title Pt will use LUE as a stabilizer/ gross assist at least 40% of the time.   ? Baseline uses 20%   ? Time 12   ? Period Weeks   ? Status New   ? Target Date 10/25/21   ?  ? OT LONG TERM GOAL #2  ? Title Patient will demonstrate reach to 70 degrees of shoulder flexion with -10 elbow extension and no more than min compensation   ? Time 12   ? Period Weeks   ? Status New   ?  ? OT LONG TERM GOAL #3  ?  Title Pt will demonstrate ability to actively release an item than has been placed in left hand 50% of the time.   ? Time 12   ? Period Weeks   ? Status New   ?  ? OT LONG TERM GOAL #4  ? Title Patient will incorporate self stretching techniques throughout the day whether at home or work.   ? Time 12   ? Period Weeks   ? Status New   ?  ? OT LONG TERM GOAL #5  ? Title Pt will demonstrate at least 80% A/ROM foream supination  following stretching in prep for improved functional use.   ? Time 12   ? Period Weeks   ? Status New   ?  ? OT LONG TERM GOAL #6  ? Title --   ? ?  ?  ? ?  ? ? ? ? ? ? ? ? Plan - 08/16/21 1013   ? ? Clinical Impression Statement Pt is progressing towards goals. He was fitted with a resting hand splint and educated in wear care and precautions for improved LUE postioning.   ? OT Occupational Profile and History Detailed Assessment- Review of Records and additional review of physical, cognitive, psychosocial history related to current functional performance   ? Occupational performance deficits (Please refer to evaluation for details): ADL's;IADL's;Leisure;Social Participation;Work;Play   ? Body Structure / Function / Physical Skills ADL;Balance;Endurance;Mobility;Strength;UE functional use;FMC;Vision;Coordination;Gait;Decreased knowledge of precautions;GMC;ROM;Decreased knowledge of use of DME;Dexterity;IADL;Sensation;Tone;Body mechanics   ? Cognitive Skills Attention;Memory;Problem Solve;Safety Awareness;Thought;Understand   ? Rehab Potential Good   ? Clinical Decision Making Limited treatment options, no task modification necessary   ? Comorbidities Affecting Occupational Performance: May have comorbidities impacting occupational performance   ? Modification or Assistance to Complete Evaluation  No modification of tasks or assist necessary to complete eval   ? OT Frequency 1x / week   ? OT Duration 12 weeks   ? OT Treatment/Interventions Self-care/ADL training;Therapeutic exercise;Functional  Mobility Training;Balance training;Splinting;Manual Therapy;Neuromuscular education;Aquatic Therapy;Ultrasound;Energy conservation;Therapeutic activities;Cryotherapy;Paraffin;DME and/or AE instruction;Cognitive remediation/compensation;Visual/perceptual remediation/compensation;Gait Training;Fluidtherapy;Electrical Stimulation;Moist Heat;Contrast Bath;Passive range of motion;Patient/family education   ?  Plan splint check and modifications prn   ? Consulted and Agree with Plan of Care Patient   ? ?  ?  ? ?  ? ? ?Patient will benefit from skilled therapeutic intervention in order to improve the following deficits and impairments:   ?Body Structure / Function / Physical Skills: ADL, Balance, Endurance, Mobility, Strength, UE functional use, FMC, Vision, Coordination, Gait, Decreased knowledge of precautions, GMC, ROM, Decreased knowledge of use of DME, Dexterity, IADL, Sensation, Tone, Body mechanics ?Cognitive Skills: Attention, Memory, Problem Solve, Safety Awareness, Thought, Understand ?  ? ? ?Visit Diagnosis: ?Hemiplegia and hemiparesis following cerebral infarction affecting left non-dominant side ([place]) ? ?Other symptoms and signs involving the nervous system ? ?Muscle weakness (generalized) ? ? ? ?Problem List ?Patient Active Problem List  ? Diagnosis Date Noted  ? Prediabetes 06/27/2021  ? Elevated glucose 10/13/2020  ? History of CVA (cerebrovascular accident) 01/19/2020  ? Normocytic anemia   ? Benign essential HTN   ? Hyponatremia   ? Acute blood loss anemia   ? Hypotension due to drugs   ? Vascular headache   ? Prerenal azotemia   ? Sleep disturbance   ? Leukocytosis   ? Transaminitis   ? Marijuana abuse   ? Essential hypertension   ? Dysphagia, post-stroke   ? Left hemiparesis ([place])   ? Acute respiratory failure ([place])   ? ICH (intracerebral hemorrhage) ([place]) 12/06/2019  ? ? ?Emeline Simpson, OT ?08/16/2021, 10:14 AM ? ?[facility] ?[address] ?[place], [place], [address] ?Phone: [phone]   Fax:  [phone] ? ?Name: Evander Macaraeg ?MRN: [id number] ?Date of Birth: [birth year] ? ?

## 2021-08-20 ENCOUNTER — Encounter: Payer: Self-pay | Admitting: Physical Therapy

## 2021-08-20 ENCOUNTER — Ambulatory Visit: Payer: Managed Care, Other (non HMO) | Admitting: Physical Therapy

## 2021-08-20 ENCOUNTER — Other Ambulatory Visit: Payer: Self-pay

## 2021-08-20 ENCOUNTER — Encounter: Payer: Self-pay | Admitting: Occupational Therapy

## 2021-08-20 ENCOUNTER — Ambulatory Visit: Payer: Managed Care, Other (non HMO) | Admitting: Occupational Therapy

## 2021-08-20 DIAGNOSIS — I69354 Hemiplegia and hemiparesis following cerebral infarction affecting left non-dominant side: Secondary | ICD-10-CM | POA: Diagnosis not present

## 2021-08-20 DIAGNOSIS — R29818 Other symptoms and signs involving the nervous system: Secondary | ICD-10-CM

## 2021-08-20 DIAGNOSIS — M6281 Muscle weakness (generalized): Secondary | ICD-10-CM

## 2021-08-20 DIAGNOSIS — R2681 Unsteadiness on feet: Secondary | ICD-10-CM

## 2021-08-20 DIAGNOSIS — R208 Other disturbances of skin sensation: Secondary | ICD-10-CM

## 2021-08-20 NOTE — Therapy (Addendum)
?OUTPATIENT PHYSICAL THERAPY TREATMENT NOTE ? ? ?Patient Name: Dennis Mercado ?MRN: [id number] ?DOB:19-Mar-1981, 41 y.o., male ?Today's Date: 08/20/2021 ? ?PCP: Libby Maw, MD ?REFERRING PROVIDER: Libby Maw,* ? ? PT End of Session - 08/20/21 0809   ? ? Visit Number 4   ? Number of Visits 9   ? Date for PT Re-Evaluation 10/31/21   ? Authorization Type [employer] VL: 60 visits PT, 30 OT - Hard limit.   ? Authorization - Visit Number 4   ? Authorization - Number of Visits 60   ? PT Start Time [phone]   ? PT Stop Time [phone]   ? PT Time Calculation (min) 40 min   ? Equipment Utilized During Treatment Gait belt   ? Activity Tolerance Patient tolerated treatment well   ? Behavior During Therapy [facility] for tasks assessed/performed   ? ?  ?  ? ?  ? ? ? ?History reviewed. No pertinent past medical history. ?Past Surgical History:  ?Procedure Laterality Date  ? WISDOM TOOTH EXTRACTION    ? ?Patient Active Problem List  ? Diagnosis Date Noted  ? Prediabetes 06/27/2021  ? Elevated glucose 10/13/2020  ? History of CVA (cerebrovascular accident) 01/19/2020  ? Normocytic anemia   ? Benign essential HTN   ? Hyponatremia   ? Acute blood loss anemia   ? Hypotension due to drugs   ? Vascular headache   ? Prerenal azotemia   ? Sleep disturbance   ? Leukocytosis   ? Transaminitis   ? Marijuana abuse   ? Essential hypertension   ? Dysphagia, post-stroke   ? Left hemiparesis ([place])   ? Acute respiratory failure ([place])   ? ICH (intracerebral hemorrhage) ([place]) 12/06/2019  ? ? ?REFERRING DIAG: R thalamic hemorrhage w/ L hemiparesis  ? ?THERAPY DIAG:  ?Hemiplegia and hemiparesis following cerebral infarction affecting left non-dominant side ([place]) ? ?Muscle weakness (generalized) ? ?Unsteadiness on feet ? ?PERTINENT HISTORY: No pertinent past medical history.  ? ?PRECAUTIONS: Fall  ? ?SUBJECTIVE: Nothing new, doing well.  ? ?PAIN:  ?Are you having pain? No ? ? ? ? ?TODAY'S TREATMENT:  ?08/20/21 ?STRENGTHENING; ?Scifit level using BLE's  only level 4.0 x 8 minutes for strengthening, ROM and activity tolerance. ? ? ? ?GAIT: ?Gait pattern: step through pattern, decreased arm swing- Left, decreased step length- Right, decreased stance time- Left, decreased stride length, decreased hip/knee flexion- Left, and decreased trunk rotation ?Distance walked: around clinic with session ?Assistive device utilized: Single point cane and None ?Level of assistance: SBA and CGA ?Comments: use of cane to enter/exit session. No device used in session with supervision/min guard.  ? ? ?CURB: SBA and CGA.  ?Bloc practice with use of arerobic step (4") on outside of parallel bars with single UE support progressing to no UE support, reminder cues on sequencing. Stepping up with RLE and descending with LLE. ?Then performed with use of 6" step as a curb beginning with UE support > none with min guard for safety, performed x8 reps total.  ? ? ?BALANCE: ?Stepping over 4 obstacles - 2 yardsticks and two 2" obstacles in // bars, performed with step to pattern and leading with LLE. Attempted some reps with leading with RLE, but pt having more difficulty clearing LLE behind him. Pt needing intermittent UE support for balance, performed down and back x6 reps.  ?Standing on level ground on LLE for SLS - tapping RLE forwards to floor dot and then crossing midline and tapping, performed x12 reps, pt needing  intermittent UE support when crossing midline.  ?Standing on air ex with LLE- stepping RLE forwards to target x15 reps and then stepping it back on, pt needing intermittent UE support during stance time on LLE.  ? ? ? ? ?HOME EXERCISE PROGRAM: ?Access code [email or web address] ? ? PT Short Term Goals   ? ?  ? PT SHORT TERM GOAL #1  ? Title Pt will decr TUG to 17.5 seconds or less in order to demo decr fall risk. ALL LTGS DUE 08/30/21   ? Baseline 18.97 seconds   ? Time 4   ? Period Weeks   ? Status New   ? Target Date 08/30/21   ?  ? PT SHORT TERM GOAL #2  ? Title Pt will ambulate over 100' of  grass and unlevel surfaces with no AD and supervision in order to demo improved balance outdoors/in his yard.   ? Time 4   ? Period Weeks   ? Status New   ? ?  ?  ? ?  ? ? ? PT Long Term Goals -  ? ?  ? PT LONG TERM GOAL #1  ? Title Pt will be independent with final HEP for stretching, ROM, and balance. ALL LTGS DUE 09/27/21   ? Time 8   ? Period Weeks   ? Status New   ? Target Date 09/27/21   ?  ? PT LONG TERM GOAL #2  ? Title Pt will perform a curb outdoors with no AD with supervision for improved community access.   ? Time 8   ? Period Weeks   ? Status New   ?  ? PT LONG TERM GOAL #3  ? Title Pt will ambulate up/down inclines with supervision in order to demo improved gait with going in and out to his mailbox with no AD.   ? Time 8   ? Period Weeks   ? Status New   ?  ? PT LONG TERM GOAL #4  ? Title Pt will improve gait speed to at least 1.9 ft/sec with no AD in order to demo decr fall risk.   ? Baseline 1.74 ft/sec   ? Time 8   ? Period Weeks   ? Status New   ?  ? PT LONG TERM GOAL #5  ? Title Pt will improve condition 4 of mCTSIB to at least 12 seconds in order to demo improved vestibular input for balance.   ? Baseline condition 4= 3 seconds (feet slightly apart)   ? Time 8   ? Period Weeks   ? Status New   ? ?  ?  ? ?  ? ? ? Plan -   ? ? Clinical Impression Statement Continued to work on curb training and SLS tasks on LLE. Able to progress curb training to using a 6" step with pt able to perform with no UE support after incr reps. Pt needing min guard for safety. Pt did better with small obstacle negotiation with stepping over first with LLE for incr foot clearance. Pt is progressing well, will continue per POC.   ? Personal Factors and Comorbidities Comorbidity 1;Past/Current Experience;Profession   ? Comorbidities HTN, right thalamic hemorrhage 12/06/2019   ? Examination-Activity Limitations Stand;Stairs;Squat;Transfers;Locomotion Level   ? Examination-Participation Restrictions Community Activity   walking  his dog  ? Stability/Clinical Decision Making Stable/Uncomplicated   ? Rehab Potential Good   ? PT Frequency 1x / week   ? PT Duration 12 weeks   ? PT  Treatment/Interventions ADLs/Self Care Home Management;Aquatic Therapy;Electrical Stimulation;DME Instruction;Gait training;Stair training;Functional mobility training;Therapeutic activities;Therapeutic exercise;Balance training;Neuromuscular re-education;Wheelchair mobility training;Orthotic Fit/Training;Patient/family education;Passive range of motion;Energy conservation;Vestibular   ? PT Next Visit Plan check STGs. Work on gait with no AD, stepping over obstacles, going up/down curbs, gait up/down inclines. Balance on unlevel surfaces, SLS tasks with LLE   ? PT Home Exercise Plan [email or web address]   ? Consulted and Agree with Plan of Care Patient   ? ?  ?  ? ?  ? ? ? ? ?Janann August, PT, DPT ?08/20/21 10:17 AM  ? ? ?   ?

## 2021-08-20 NOTE — Therapy (Signed)
[facility] [address] [place], [place], [address] Phone: [phone]   Fax:  [phone]  Occupational Therapy Treatment  Patient Details  Name: Dennis Mercado MRN: [id number] Date of Birth: 1980-07-28 Referring Provider (OT): Dr. Letta Pate   Encounter Date: 08/20/2021   OT End of Session - 08/20/21 0851     Visit Number 4    Number of Visits 12    Date for OT Re-Evaluation 10/25/21    Authorization Type cigna- POC written for 12 weeks, plan to d/c after 8 weeks    Authorization Time Period per patient 30 visit OT limit    Authorization - Visit Number 3    Authorization - Number of Visits 30   per year   Progress Note Due on Visit 30    OT Start Time 0850    OT Stop Time 0930    OT Time Calculation (min) 40 min    Activity Tolerance Patient tolerated treatment well    Behavior During Therapy [facility] for tasks assessed/performed             History reviewed. No pertinent past medical history.  Past Surgical History:  Procedure Laterality Date   WISDOM TOOTH EXTRACTION      There were no vitals filed for this visit.   Subjective Assessment - 08/20/21 0850     Subjective  Pt reports that resting hand splint is working well    Pertinent History Pt. presented to ED on 12/06/2019 with left side weakness and  altered mental status s/p coitus.  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 Pt required intubation 6/22 due to ARF secondary to aspiration PNA. Received inpatient rehab. Pt was discharged 7/28/2, pt returns to OT after botox on  07/20/21 by Dr. Letta Pate    Limitations HTN, left inattention, apraxia    Patient Stated Goals to be able to use my hand    Currently in Pain? No/denies              In supine, PROM stretch to wrist in ext/supination, fingers in extension, elbow in ext, and shoulder in ER/abduction  individually and then compositely as able. Then, closed-chain shoulder flex and chest press with PVC frame with min facilitation/cueing for full elbow ext and LUE positioning.  With cylinder object in L hand, isolated active wrist ext and supination as able with min facilitation/cues.  Sitting, wt. Bearing through L hand over foam for improved finger positioning with body on arm movements with min facilitation for hand position and cueing for elbow ext/shoulder position.    Standing, wt. Bearing through both hands over foam for improved finger positioning with forward/backward wt. Shifts with min facilitation/cueing.  Sitting, neuro re-ed for low range functional reach and grasp/release with focus on normal movement patterns and decr tone/active relaxation with min-mod facilitation/cues.        OT Short Term Goals - 08/02/21 1039       OT SHORT TERM GOAL #1   Title I with updated HEP    Time 6    Period Weeks    Status New      OT SHORT TERM GOAL #2   Title Pt will demonstrate reach to 60*shoulder flexion with -15 elbow extension and no more than min compensations,    Baseline shoulder flexion to 105* with mod compensations and  significant elbow flexion present    Time 6  Period Weeks    Status New      OT SHORT TERM GOAL #3   Title Pt will demonstrate A/ROM wrist extension to -10 for improved functional use.    Time 6    Period Weeks    Status New      OT SHORT TERM GOAL #4   Title I with positioning/ splint wear, care and precautions to  minimize risk for contracture.    Time 6    Period Weeks    Status New      OT SHORT TERM GOAL #5   Title Pt will incorporate LUE into functional activities as a stabilizer/ gross A at least 30% of the time.    Time 6    Period Weeks    Status New               OT Long Term Goals - 08/02/21 1043       OT LONG TERM GOAL #1   Title Pt will use LUE as a stabilizer/ gross assist at least 40% of the time.    Baseline uses  20%    Time 12    Period Weeks    Status New    Target Date 10/25/21      OT LONG TERM GOAL #2   Title Patient will demonstrate reach to 70 degrees of shoulder flexion with -10 elbow extension and no more than min compensation    Time 12    Period Weeks    Status New      OT LONG TERM GOAL #3   Title Pt will demonstrate ability to actively release an item than has been placed in left hand 50% of the time.    Time 12    Period Weeks    Status New      OT LONG TERM GOAL #4   Title Patient will incorporate self stretching techniques throughout the day whether at home or work.    Time 12    Period Weeks    Status New      OT LONG TERM GOAL #5   Title Pt will demonstrate at least 80% A/ROM foream supination  following stretching in prep for improved functional use.    Time 12    Period Weeks    Status New      OT LONG TERM GOAL #6   Title --                   Plan - 08/20/21 0851     Clinical Impression Statement Pt is progressing towards goals.  He reports decr spasticity after Botox.  Pt with decr tone noted after stretching and wt. bearing.  Encouraged pt to incorporate wt. bearing more during the work day as able.    OT Occupational Profile and History Detailed Assessment- Review of Records and additional review of physical, cognitive, psychosocial history related to current functional performance    Occupational performance deficits (Please refer to evaluation for details): ADL's;IADL's;Leisure;Social Participation;Work;Play    Body Structure / Function / Physical Skills ADL;Balance;Endurance;Mobility;Strength;UE functional use;FMC;Vision;Coordination;Gait;Decreased knowledge of precautions;GMC;ROM;Decreased knowledge of use of DME;Dexterity;IADL;Sensation;Tone;Body Banker;Safety Awareness;Thought;Understand    Rehab Potential Good    Clinical Decision Making Limited treatment options, no task modification  necessary    Comorbidities Affecting Occupational Performance: May have comorbidities impacting occupational performance    Modification or Assistance to Complete Evaluation  No modification of tasks or assist necessary to complete eval  OT Frequency 1x / week    OT Duration 12 weeks    OT Treatment/Interventions Self-care/ADL training;Therapeutic exercise;Functional Mobility Training;Balance training;Splinting;Manual Therapy;Neuromuscular education;Aquatic Therapy;Ultrasound;Energy conservation;Therapeutic activities;Cryotherapy;Paraffin;DME and/or AE instruction;Cognitive remediation/compensation;Visual/perceptual remediation/compensation;Gait Training;Fluidtherapy;Electrical Stimulation;Moist Heat;Contrast Bath;Passive range of motion;Patient/family education    Plan splint check and modifications prn, neuro re-ed    Consulted and Agree with Plan of Care Patient             Patient will benefit from skilled therapeutic intervention in order to improve the following deficits and impairments:   Body Structure / Function / Physical Skills: ADL, Balance, Endurance, Mobility, Strength, UE functional use, FMC, Vision, Coordination, Gait, Decreased knowledge of precautions, GMC, ROM, Decreased knowledge of use of DME, Dexterity, IADL, Sensation, Tone, Body mechanics Cognitive Skills: Attention, Memory, Problem Solve, Safety Awareness, Thought, Understand     Visit Diagnosis: Hemiplegia and hemiparesis following cerebral infarction affecting left non-dominant side (HCC)  Muscle weakness (generalized)  Unsteadiness on feet  Other symptoms and signs involving the nervous system  Other disturbances of skin sensation    Problem List Patient Active Problem List   Diagnosis Date Noted   Prediabetes 06/27/2021   Elevated glucose 10/13/2020   History of CVA (cerebrovascular accident) 01/19/2020   Normocytic anemia    Benign essential HTN    Hyponatremia    Acute blood loss anemia     Hypotension due to drugs    Vascular headache    Prerenal azotemia    Sleep disturbance    Leukocytosis    Transaminitis    Marijuana abuse    Essential hypertension    Dysphagia, post-stroke    Left hemiparesis (HCC)    Acute respiratory failure (HCC)    ICH (intracerebral hemorrhage) ([facility]) 12/06/2019    Deshay Blumenfeld, OT 08/20/2021, 9:42 AM  [facility] [address] [place], [place], [address] Phone: [phone]   Fax:  [phone]  Name: Dennis Mercado MRN: [id number] Date of Birth: Mar 29, 1981  Vianne Bulls, OTR/L [facility] [address] [place], [place]  [address] [phone] phone [phone] 08/20/21 9:42 AM

## 2021-08-27 ENCOUNTER — Ambulatory Visit: Payer: Managed Care, Other (non HMO) | Admitting: Occupational Therapy

## 2021-08-27 ENCOUNTER — Ambulatory Visit: Payer: Managed Care, Other (non HMO)

## 2021-08-27 ENCOUNTER — Other Ambulatory Visit: Payer: Self-pay

## 2021-08-27 ENCOUNTER — Encounter: Payer: Self-pay | Admitting: Occupational Therapy

## 2021-08-27 DIAGNOSIS — R29818 Other symptoms and signs involving the nervous system: Secondary | ICD-10-CM

## 2021-08-27 DIAGNOSIS — R208 Other disturbances of skin sensation: Secondary | ICD-10-CM

## 2021-08-27 DIAGNOSIS — M6281 Muscle weakness (generalized): Secondary | ICD-10-CM

## 2021-08-27 DIAGNOSIS — I69354 Hemiplegia and hemiparesis following cerebral infarction affecting left non-dominant side: Secondary | ICD-10-CM

## 2021-08-27 DIAGNOSIS — R2681 Unsteadiness on feet: Secondary | ICD-10-CM

## 2021-08-27 DIAGNOSIS — R2689 Other abnormalities of gait and mobility: Secondary | ICD-10-CM

## 2021-08-27 NOTE — Therapy (Signed)
[facility] [address] [place], [place], [address] Phone: [phone]   Fax:  [phone]  Occupational Therapy Treatment  Patient Details  Name: Dennis Mercado MRN: [id number] Date of Birth: 1980/07/15 Referring Provider (OT): Dr. Letta Pate   Encounter Date: 08/27/2021   OT End of Session - 08/27/21 0859     Visit Number 5    Number of Visits 12    Date for OT Re-Evaluation 10/25/21    Authorization Type cigna- POC written for 12 weeks, plan to d/c after 8 weeks    Authorization Time Period per patient 30 visit OT limit    Authorization - Visit Number 5    Authorization - Number of Visits 30   per year   Progress Note Due on Visit 30    OT Start Time 0807    OT Stop Time 0848    OT Time Calculation (min) 41 min    Activity Tolerance Patient tolerated treatment well    Behavior During Therapy [facility] for tasks assessed/performed             History reviewed. No pertinent past medical history.  Past Surgical History:  Procedure Laterality Date   WISDOM TOOTH EXTRACTION      There were no vitals filed for this visit.   Subjective Assessment - 08/27/21 0859     Subjective  Pt reports that he wore resting hand splint at night with no problems.    Pertinent History Pt. presented to ED on 12/06/2019 with left side weakness and  altered mental status s/p coitus.  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 Pt required intubation 6/22 due to ARF secondary to aspiration PNA. Received inpatient rehab. Pt was discharged 7/28/2, pt returns to OT after botox on  07/20/21 by Dr. Letta Pate    Limitations HTN, left inattention, apraxia    Patient Stated Goals to be able to use my hand              Checked custom-fabricated resting hand splint.  Splint appears to be fitting well.  Recommended adjusted placement of distal finger  strap to cover DIP of 2nd digit.  Pt verbalized understanding.  Checked Saebo splint.  Pt reports that he has not been wearing consistently.  Recommended pt only wear for up to 25mn 2x/day.  Added padding under MPs for improved positioning and added strap at DIPs for incr stretch.  Pt verbalized understanding of modifications/recommendations.  In supine, closed-chain shoulder flex and chest press with PVC frame with min facilitation/cueing for full elbow ext and LUE positioning.    In prone, with LUE off edge of mat, shoulder ext with scapular retraction/depression with min-mod facilitation for positioning.  Then, wt. Bearing through elbows with head/chest lift for incr scapular depression/retraction with min-mod cueing.  Sitting, wt. Bearing through L hand over foam for improved finger positioning with body on arm movements with min-mod facilitation for hand position and cueing for elbow ext/shoulder position.           OT Short Term Goals - 08/27/21 0902       OT SHORT TERM GOAL #1   Title I with updated HEP    Time 6    Period Weeks    Status On-going      OT SHORT TERM GOAL #2   Title Pt will demonstrate reach to 60*shoulder flexion with -15 elbow extension and no more than  min compensations,    Baseline shoulder flexion to 105* with mod compensations and  significant elbow flexion present    Time 6    Period Weeks    Status New      OT SHORT TERM GOAL #3   Title Pt will demonstrate A/ROM wrist extension to -10 for improved functional use.    Time 6    Period Weeks    Status New      OT SHORT TERM GOAL #4   Title I with positioning/ splint wear, care and precautions to  minimize risk for contracture.    Time 6    Period Weeks    Status On-going      OT SHORT TERM GOAL #5   Title Pt will incorporate LUE into functional activities as a stabilizer/ gross A at least 30% of the time.    Time 6    Period Weeks    Status New               OT Long Term Goals -  08/02/21 1043       OT LONG TERM GOAL #1   Title Pt will use LUE as a stabilizer/ gross assist at least 40% of the time.    Baseline uses 20%    Time 12    Period Weeks    Status New    Target Date 10/25/21      OT LONG TERM GOAL #2   Title Patient will demonstrate reach to 70 degrees of shoulder flexion with -10 elbow extension and no more than min compensation    Time 12    Period Weeks    Status New      OT LONG TERM GOAL #3   Title Pt will demonstrate ability to actively release an item than has been placed in left hand 50% of the time.    Time 12    Period Weeks    Status New      OT LONG TERM GOAL #4   Title Patient will incorporate self stretching techniques throughout the day whether at home or work.    Time 12    Period Weeks    Status New      OT LONG TERM GOAL #5   Title Pt will demonstrate at least 80% A/ROM foream supination  following stretching in prep for improved functional use.    Time 12    Period Weeks    Status New      OT LONG TERM GOAL #6   Title --                   Plan - 08/27/21 0900     Clinical Impression Statement Pt progressing towards goals.  Pt demo improved L shoulder positioning and responds well to cueing.    OT Occupational Profile and History Detailed Assessment- Review of Records and additional review of physical, cognitive, psychosocial history related to current functional performance    Occupational performance deficits (Please refer to evaluation for details): ADL's;IADL's;Leisure;Social Participation;Work;Play    Body Structure / Function / Physical Skills ADL;Balance;Endurance;Mobility;Strength;UE functional use;FMC;Vision;Coordination;Gait;Decreased knowledge of precautions;GMC;ROM;Decreased knowledge of use of DME;Dexterity;IADL;Sensation;Tone;Body Banker;Safety Awareness;Thought;Understand    Rehab Potential Good    Clinical Decision Making Limited treatment  options, no task modification necessary    Comorbidities Affecting Occupational Performance: May have comorbidities impacting occupational performance    Modification or Assistance to Complete Evaluation  No modification of tasks  or assist necessary to complete eval    OT Frequency 1x / week    OT Duration 12 weeks    OT Treatment/Interventions Self-care/ADL training;Therapeutic exercise;Functional Mobility Training;Balance training;Splinting;Manual Therapy;Neuromuscular education;Aquatic Therapy;Ultrasound;Energy conservation;Therapeutic activities;Cryotherapy;Paraffin;DME and/or AE instruction;Cognitive remediation/compensation;Visual/perceptual remediation/compensation;Gait Training;Fluidtherapy;Electrical Stimulation;Moist Heat;Contrast Bath;Passive range of motion;Patient/family education    Plan continue with neuro re-ed, check STGs    Consulted and Agree with Plan of Care Patient             Patient will benefit from skilled therapeutic intervention in order to improve the following deficits and impairments:   Body Structure / Function / Physical Skills: ADL, Balance, Endurance, Mobility, Strength, UE functional use, FMC, Vision, Coordination, Gait, Decreased knowledge of precautions, GMC, ROM, Decreased knowledge of use of DME, Dexterity, IADL, Sensation, Tone, Body mechanics Cognitive Skills: Attention, Memory, Problem Solve, Safety Awareness, Thought, Understand     Visit Diagnosis: Hemiplegia and hemiparesis following cerebral infarction affecting left non-dominant side (HCC)  Other symptoms and signs involving the nervous system  Other disturbances of skin sensation    Problem List Patient Active Problem List   Diagnosis Date Noted   Prediabetes 06/27/2021   Elevated glucose 10/13/2020   History of CVA (cerebrovascular accident) 01/19/2020   Normocytic anemia    Benign essential HTN    Hyponatremia    Acute blood loss anemia    Hypotension due to drugs    Vascular  headache    Prerenal azotemia    Sleep disturbance    Leukocytosis    Transaminitis    Marijuana abuse    Essential hypertension    Dysphagia, post-stroke    Left hemiparesis (HCC)    Acute respiratory failure (HCC)    ICH (intracerebral hemorrhage) ([facility]) 12/06/2019    Nyle Limb, OT 08/27/2021, 9:26 AM  [facility] [address] [place], [place], [address] Phone: [phone]   Fax:  [phone]  Name: Dennis Mercado MRN: [id number] Date of Birth: 25-Sep-1980  Vianne Bulls, OTR/L [facility] [address] [place], [place]  [address] [phone] phone [phone] 08/27/21 9:26 AM

## 2021-08-27 NOTE — Therapy (Signed)
?OUTPATIENT PHYSICAL THERAPY TREATMENT NOTE ? ? ?Patient Name: Dennis Mercado ?MRN: [id number] ?DOB:04-10-1981, 41 y.o., male ?Today's Date: 08/27/2021 ? ?PCP: Libby Maw, MD ?REFERRING PROVIDER: Libby Maw,* ? ? PT End of Session - 08/27/21 [phone]   ? ? Visit Number 5   ? Number of Visits 9   ? Date for PT Re-Evaluation 10/31/21   ? Authorization Type [employer] VL: 60 visits PT, 30 OT - Hard limit.   ? Authorization - Visit Number 5   ? Authorization - Number of Visits 60   ? PT Start Time [phone]   ? PT Stop Time 0930   ? PT Time Calculation (min) 43 min   ? Equipment Utilized During Treatment Gait belt   ? Activity Tolerance Patient tolerated treatment well   ? Behavior During Therapy [facility] for tasks assessed/performed   ? ?  ?  ? ?  ? ? ? ? ?No past medical history on file. ?Past Surgical History:  ?Procedure Laterality Date  ? WISDOM TOOTH EXTRACTION    ? ?Patient Active Problem List  ? Diagnosis Date Noted  ? Prediabetes 06/27/2021  ? Elevated glucose 10/13/2020  ? History of CVA (cerebrovascular accident) 01/19/2020  ? Normocytic anemia   ? Benign essential HTN   ? Hyponatremia   ? Acute blood loss anemia   ? Hypotension due to drugs   ? Vascular headache   ? Prerenal azotemia   ? Sleep disturbance   ? Leukocytosis   ? Transaminitis   ? Marijuana abuse   ? Essential hypertension   ? Dysphagia, post-stroke   ? Left hemiparesis ([place])   ? Acute respiratory failure ([place])   ? ICH (intracerebral hemorrhage) ([facility]) 12/06/2019  ? ? ?REFERRING DIAG: R thalamic hemorrhage w/ L hemiparesis  ? ?THERAPY DIAG:  ?Hemiplegia and hemiparesis following cerebral infarction affecting left non-dominant side ([place]) ? ?Muscle weakness (generalized) ? ?Unsteadiness on feet ? ?Other abnormalities of gait and mobility ? ?PERTINENT HISTORY: No pertinent past medical history.  ? ?PRECAUTIONS: Fall  ? ?SUBJECTIVE: Patient reports no new changes. Had a good weekend in [place], was really crowded. No other new  changes/complaints. ? ?PAIN:  ?Are you having pain? No ? ? ?TODAY'S TREATMENT:  ? ?STRENGTHENING; ?Scifit level using BLE's only level 4.0 x 8 minutes for strengthening, ROM and activity tolerance. Pt able to maintain steps per minute > 75.  ? ? ?GAIT: ?Gait pattern: step through pattern, decreased arm swing- Left, decreased step length- Right, decreased stance time- Left, decreased stride length, decreased hip/knee flexion- Left, and decreased trunk rotation ?Distance walked: 150 ft outdoors (additional distances throughout session without AD indoors)  ?Assistive device utilized: Single point cane and None ?Level of assistance: SBA and CGA ?Comments: continue use of SPC to enter session. Completed ambulation outdoors on unlevel paved and grass surface x 150 ft without AD, mild imbalance noted on unlevel surfaces but able to maintain balance without assistance from PT, just supervision level.  ? ?Outcome Measures:  ?TUG: 16.52 seconds without AD  ? ?CURB: SBA and CGA. Trialed [employer], initially completed with SPC x 1 reps with supervision. Then Trialed without AD x 2 reps. Imbalance noted on first rep requiring CGA. Improvements noted with 2nd red.  ? ? ? ?BALANCE: ?Standing on airex with BLE, completed alternating stepping forwards to various target placed anterior and anterior-lateral x 4 reps alternating LE and then stepping back onto irex. Pt needing intermittent UE  support and CGA during stance time on  LLE.  ?Standing on airex: completed alternating steps forward onto 6" step working toward reduced UE support, alternating LE step forward but using RLE leading to step back onto airex for eccentric control of LLE. Completed x 10 reps bilat.  ? ? ? ? ?HOME EXERCISE PROGRAM: ?Access code [email or web address] ? ? PT Short Term Goals   ? ?  ? PT SHORT TERM GOAL #1  ? Title Pt will decr TUG to 17.5 seconds or less in order to demo decr fall risk. ALL LTGS DUE 08/30/21   ? Baseline 18.97 seconds; 16.52 secs without AD  ?  Time 4   ? Period Weeks   ? Status Achieved  ? Target Date 08/30/21   ?  ? PT SHORT TERM GOAL #2  ? Title Pt will ambulate over 100' of grass and unlevel surfaces with no AD and supervision in order to demo improved balance outdoors/in his yard.   ? Time 4   ? Baseline 150 ft outdoors grass surface without AD and supervision.  ? Period Weeks   ? Status Achieved  ? ?  ?  ? ?  ? ? ? PT Long Term Goals -  ? ?  ? PT LONG TERM GOAL #1  ? Title Pt will be independent with final HEP for stretching, ROM, and balance. ALL LTGS DUE 09/27/21   ? Time 8   ? Period Weeks   ? Status New   ? Target Date 09/27/21   ?  ? PT LONG TERM GOAL #2  ? Title Pt will perform a curb outdoors with no AD with supervision for improved community access.   ? Time 8   ? Period Weeks   ? Status New   ?  ? PT LONG TERM GOAL #3  ? Title Pt will ambulate up/down inclines with supervision in order to demo improved gait with going in and out to his mailbox with no AD.   ? Time 8   ? Period Weeks   ? Status New   ?  ? PT LONG TERM GOAL #4  ? Title Pt will improve gait speed to at least 1.9 ft/sec with no AD in order to demo decr fall risk.   ? Baseline 1.74 ft/sec   ? Time 8   ? Period Weeks   ? Status New   ?  ? PT LONG TERM GOAL #5  ? Title Pt will improve condition 4 of mCTSIB to at least 12 seconds in order to demo improved vestibular input for balance.   ? Baseline condition 4= 3 seconds (feet slightly apart)   ? Time 8   ? Period Weeks   ? Status New   ? ?  ?  ? ?  ? ? ? Plan -   ? ? Clinical Impression Statement Today's skilled PT session focused on assessment of progress toward STG's. Patient able to meet all STG's during session. Patient improved TUG to 16.52 seconds, and demo ability to ambulate outdoors on unlevel surfaces without AD and supervision. Patient is making slow steady progress and will continue to benefit from skilled PT services to address impairments.   ? Personal Factors and Comorbidities Comorbidity 1;Past/Current  Experience;Profession   ? Comorbidities HTN, right thalamic hemorrhage 12/06/2019   ? Examination-Activity Limitations Stand;Stairs;Squat;Transfers;Locomotion Level   ? Examination-Participation Restrictions Community Activity   walking his dog  ? Stability/Clinical Decision Making Stable/Uncomplicated   ? Rehab Potential Good   ? PT Frequency 1x / week   ?  PT Duration 12 weeks   ? PT Treatment/Interventions ADLs/Self Care Home Management;Aquatic Therapy;Electrical Stimulation;DME Instruction;Gait training;Stair training;Functional mobility training;Therapeutic activities;Therapeutic exercise;Balance training;Neuromuscular re-education;Wheelchair mobility training;Orthotic Fit/Training;Patient/family education;Passive range of motion;Energy conservation;Vestibular   ? PT Next Visit Plan Continue to work on gait with no AD, stepping over obstacles, going up/down curbs, gait up/down inclines. Balance on unlevel surfaces, SLS tasks with LLE   ? PT Home Exercise Plan [email or web address]   ? Consulted and Agree with Plan of Care Patient   ? ?  ?  ? ?  ? ? ? ? ?Jones Bales, PT, DPT ?08/27/21 9:41 AM  ? ? ?   ?

## 2021-08-31 ENCOUNTER — Encounter: Payer: Self-pay | Admitting: Physical Medicine & Rehabilitation

## 2021-08-31 ENCOUNTER — Other Ambulatory Visit: Payer: Self-pay

## 2021-08-31 ENCOUNTER — Encounter
Payer: Managed Care, Other (non HMO) | Attending: Physical Medicine & Rehabilitation | Admitting: Physical Medicine & Rehabilitation

## 2021-08-31 VITALS — BP 122/84 | HR 85 | Ht 67.0 in | Wt 155.0 lb

## 2021-08-31 DIAGNOSIS — G8114 Spastic hemiplegia affecting left nondominant side: Secondary | ICD-10-CM | POA: Insufficient documentation

## 2021-08-31 NOTE — Patient Instructions (Signed)
Will make some dose adjustment to botulinum toxin increase finger flexors ?

## 2021-08-31 NOTE — Progress Notes (Signed)
? ?Subjective:  ? ? Patient ID: Dennis Mercado, male    DOB: 10-Dec-1980, 41 y.o.   MRN: [id number] ? ?HPI ?Patient returns today doing well.  Continues to work full-time.  Going to physical and occupational therapy at neuro rehab.  We discussed the effect of his most recent botulinum toxin injection.  Patient overall is pleased.  Still feels a little tight at the finger flexors.  We discussed previous higher dosing on FDP and FDS which resulted in excessive relaxation. ?Number of units per muscle ?Biceps 400 units ?Brachiorad 100 units ?FDP 100 units ?FDS 100 units ?FCR 200 units ?FPL 100 units ? ? ?Pain Inventory ?Average Pain 0 ?Pain Right Now 0 ?My pain is  No pain ? ?LOCATION OF PAIN  No pain ? ?BOWEL ?Number of stools per week: 10 ? ?BLADDER ?Normal ? ? ? ?Mobility ?use a cane ?how many minutes can you walk? One mile  ?ability to climb steps?  yes ?do you drive?  yes ?Do you have any goals in this area?  yes ? ?Function ?employed # of hrs/week Full time [employer] ? ?Neuro/Psych ?weakness ? ?Prior Studies ?Any changes since last visit?  no ? ?Physicians involved in your care ?Any changes since last visit?  no. ? ? ?No family history on file. ?Social History  ? ?Socioeconomic History  ? Marital status: Married  ?  Spouse name: Not on file  ? Number of children: Not on file  ? Years of education: Not on file  ? Highest education level: Not on file  ?Occupational History  ? Not on file  ?Tobacco Use  ? Smoking status: Never  ? Smokeless tobacco: Never  ?Vaping Use  ? Vaping Use: Never used  ?Substance and Sexual Activity  ? Alcohol use: Not Currently  ?  Comment: several drinks over the weekend  ? Drug use: Never  ? Sexual activity: Not on file  ?Other Topics Concern  ? Not on file  ?Social History Narrative  ? Not on file  ? ?Social Determinants of Health  ? ?Financial Resource Strain: Not on file  ?Food Insecurity: Not on file  ?Transportation Needs: Not on file  ?Physical Activity: Not on file  ?Stress: Not  on file  ?Social Connections: Not on file  ? ?Past Surgical History:  ?Procedure Laterality Date  ? WISDOM TOOTH EXTRACTION    ? ?No past medical history on file. ?Ht '5\' 7"'$  (1.702 m)   BMI 24.43 kg/m?  ? ?Opioid Risk Score:   ?Fall Risk Score:  `1 ? ?Depression screen PHQ 2/9 ? ?Depression screen [facility] 2/9 08/14/2021 07/20/2021 06/27/2021 02/01/2021 01/31/2021 10/05/2020 10/05/2020  ?Decreased Interest 0 0 0 0 0 0 0  ?Down, Depressed, Hopeless 0 0 0 0 0 0 0  ?PHQ - 2 Score 0 0 0 0 0 0 0  ?Altered sleeping - - - - - 1 -  ?Tired, decreased energy - - - - - 1 -  ?Change in appetite - - - - - 0 -  ?Feeling bad or failure about yourself  - - - - - 0 -  ?Trouble concentrating - - - - - 0 -  ?Moving slowly or fidgety/restless - - - - - 0 -  ?Suicidal thoughts - - - - - 0 -  ?PHQ-9 Score - - - - - 2 -  ?Difficult doing work/chores - - - - - Not difficult at all -  ?Some recent data might be hidden  ? ? ?Review  of Systems  ?Neurological:  Positive for weakness.  ?     Left arm & left leg weakness  ?All other systems reviewed and are negative. ? ?   ?Objective:  ? Physical Exam ?Vitals and nursing note reviewed.  ?Constitutional:   ?   Appearance: He is normal weight.  ?HENT:  ?   Head: Normocephalic and atraumatic.  ?Eyes:  ?   Extraocular Movements: Extraocular movements intact.  ?   Conjunctiva/sclera: Conjunctivae normal.  ?   Pupils: Pupils are equal, round, and reactive to light.  ?Musculoskeletal:  ?   Right lower leg: No edema.  ?   Left lower leg: No edema.  ?Skin: ?   General: Skin is warm and dry.  ?Neurological:  ?   Mental Status: He is alert and oriented to person, place, and time.  ?Psychiatric:     ?   Mood and Affect: Mood normal.     ?   Behavior: Behavior normal.  ? ?Tone ?Elbow flexors left MAS 1/2 ?Finger flexors left MAS 3 at FDP 2 at FDS ?Thumb flexor left MAS 2 ?Wrist flexor left MAS 1 ? ? ?   ?Assessment & Plan:  ?1.  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 ?FDP 200 units ?FDS 100 units ?FCR 200 units ?FPL 100 units ?

## 2021-09-03 ENCOUNTER — Ambulatory Visit: Payer: Managed Care, Other (non HMO) | Admitting: Occupational Therapy

## 2021-09-03 ENCOUNTER — Encounter: Payer: Self-pay | Admitting: Occupational Therapy

## 2021-09-03 ENCOUNTER — Ambulatory Visit: Payer: Managed Care, Other (non HMO)

## 2021-09-03 ENCOUNTER — Other Ambulatory Visit: Payer: Self-pay

## 2021-09-03 DIAGNOSIS — R2689 Other abnormalities of gait and mobility: Secondary | ICD-10-CM

## 2021-09-03 DIAGNOSIS — R2681 Unsteadiness on feet: Secondary | ICD-10-CM

## 2021-09-03 DIAGNOSIS — M6281 Muscle weakness (generalized): Secondary | ICD-10-CM

## 2021-09-03 DIAGNOSIS — I69354 Hemiplegia and hemiparesis following cerebral infarction affecting left non-dominant side: Secondary | ICD-10-CM | POA: Diagnosis not present

## 2021-09-03 DIAGNOSIS — R29818 Other symptoms and signs involving the nervous system: Secondary | ICD-10-CM

## 2021-09-03 DIAGNOSIS — R208 Other disturbances of skin sensation: Secondary | ICD-10-CM

## 2021-09-03 NOTE — Therapy (Signed)
[facility], [place], [address] ?Phone: [phone]   Fax:  [phone] ? ?Occupational Therapy Treatment ? ?Patient Details  ?Name: Dennis Mercado ?MRN: [id number] ?Date of Birth: Oct 23, 1980 ?Referring Provider (OT): Dr. Letta Pate ? ? ?Encounter Date: 09/03/2021 ? ? OT End of Session - 09/03/21 1052   ? ? Visit Number 6   ? Number of Visits 12   ? Date for OT Re-Evaluation 10/25/21   ? Authorization Type cigna- POC written for 12 weeks, plan to d/c after 8 weeks   ? Authorization Time Period per patient 30 visit OT limit   ? Authorization - Visit Number 6   ? Authorization - Number of Visits 30   per year  ? Progress Note Due on Visit 30   ? OT Start Time [phone]   ? OT Stop Time 0930   ? OT Time Calculation (min) 40 min   ? Activity Tolerance Patient tolerated treatment well   ? Behavior During Therapy [facility] for tasks assessed/performed   ? ?  ?  ? ?  ? ? ?History reviewed. No pertinent past medical history. ? ?Past Surgical History:  ?Procedure Laterality Date  ? WISDOM TOOTH EXTRACTION    ? ? ?There were no vitals filed for this visit. ? ? Subjective Assessment - 09/03/21 1050   ? ? Subjective  Pt reports that he wore resting hand splint at night with no problems.   ? Pertinent History Pt. presented to ED on 12/06/2019 with left side weakness and  altered mental status s/p coitus.  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 Pt required intubation 6/22 due to ARF secondary to aspiration PNA. Received inpatient rehab. Pt was discharged 7/28/2, pt returns to OT after botox on  07/20/21 by Dr. Letta Pate   ? Limitations HTN, left inattention, apraxia   ? Patient Stated Goals to be able to use my hand   ? Currently in Pain? No/denies   ? ?  ?  ? ?  ? ? ? ? ? ?In supine, closed-chain shoulder flex and chest press with PVC frame with min facilitation/cueing  for full elbow ext and LUE positioning.   ? ?Sitting, wt. Bearing through L hand over dome for improved finger positioning with body on arm movements with min facilitation for hand position and cueing for elbow ext/shoulder position.   ? ?Seated, worked on elbow ext with neutral forearm/shoulder in prep for functional reach as able with min facilitation/cues.  Light wt. Bearing/AAROM elbow ext/shoulder flex through tilted stool with min facilitation.   Sitting, Closed-chain low-range shoulder flex with PVC frame with min-mod facilitation.  ? ?Standing, wt. Bearing through both hands over dome for improved finger positioning with forward/backward wt. Shifts with min facilitation/cueing. ? ?Sitting, neuro re-ed for low range functional reach and grasp/release with focus on normal movement patterns and decr tone/active relaxation with mod facilitation/cues. ? ?Checked STGs and discussed progress--see goal section below. ? ? ? ? ? ? ? OT Short Term Goals - 09/03/21 0917   ? ?  ? OT SHORT TERM GOAL #1  ? Title I with updated HEP   ? Time 6   ? Period Weeks   ? Status Achieved   ?  ? OT SHORT TERM GOAL #2  ? Title Pt will demonstrate reach to 60*shoulder flexion with -15 elbow extension and no more than min compensations,   ?  Baseline shoulder flexion to 105* with mod compensations and  significant elbow flexion present   ? Time 6   ? Period Weeks   ? Status Achieved   09/03/21:  80* with -15* elbow ext, and IR noted  ?  ? OT SHORT TERM GOAL #3  ? Title Pt will demonstrate A/ROM wrist extension to -10 for improved functional use.   ? Time 6   ? Period Weeks   ? Status Achieved   09/03/21:  AROM to neutral  ?  ? OT SHORT TERM GOAL #4  ? Title I with positioning/ splint wear, care and precautions to  minimize risk for contracture.   ? Time 6   ? Period Weeks   ? Status Achieved   ?  ? OT SHORT TERM GOAL #5  ? Title Pt will incorporate LUE into functional activities as a stabilizer/ gross A at least 30% of the time.   ? Time 6    ? Period Weeks   ? Status On-going   09/03/21:  not fully met, but pt reports using LUE more  ? ?  ?  ? ?  ? ? ? ? OT Long Term Goals - 08/02/21 1043   ? ?  ? OT LONG TERM GOAL #1  ? Title Pt will use LUE as a stabilizer/ gross assist at least 40% of the time.   ? Baseline uses 20%   ? Time 12   ? Period Weeks   ? Status New   ? Target Date 10/25/21   ?  ? OT LONG TERM GOAL #2  ? Title Patient will demonstrate reach to 70 degrees of shoulder flexion with -10 elbow extension and no more than min compensation   ? Time 12   ? Period Weeks   ? Status New   ?  ? OT LONG TERM GOAL #3  ? Title Pt will demonstrate ability to actively release an item than has been placed in left hand 50% of the time.   ? Time 12   ? Period Weeks   ? Status New   ?  ? OT LONG TERM GOAL #4  ? Title Patient will incorporate self stretching techniques throughout the day whether at home or work.   ? Time 12   ? Period Weeks   ? Status New   ?  ? OT LONG TERM GOAL #5  ? Title Pt will demonstrate at least 80% A/ROM foream supination  following stretching in prep for improved functional use.   ? Time 12   ? Period Weeks   ? Status New   ?  ? OT LONG TERM GOAL #6  ? Title --   ? ?  ?  ? ?  ? ? ? ? ? ? ? ? Plan - 09/03/21 1052   ? ? Clinical Impression Statement Pt is progressing towards goals with improving shoulder positioning and incr elbow ext.  However, L hand spasticity continues to impact LUE functional use.   ? OT Occupational Profile and History Detailed Assessment- Review of Records and additional review of physical, cognitive, psychosocial history related to current functional performance   ? Occupational performance deficits (Please refer to evaluation for details): ADL's;IADL's;Leisure;Social Participation;Work;Play   ? Body Structure / Function / Physical Skills ADL;Balance;Endurance;Mobility;Strength;UE functional use;FMC;Vision;Coordination;Gait;Decreased knowledge of precautions;GMC;ROM;Decreased knowledge of use of  DME;Dexterity;IADL;Sensation;Tone;Body mechanics   ? Cognitive Skills Attention;Memory;Problem Solve;Safety Awareness;Thought;Understand   ? Rehab Potential Good   ? Clinical Decision Making Limited treatment  options, no task modification necessary   ? Comorbidities Affecting Occupational Performance: May have comorbidities impacting occupational performance   ? Modification or Assistance to Complete Evaluation  No modification of tasks or assist necessary to complete eval   ? OT Frequency 1x / week   ? OT Duration 12 weeks   ? OT Treatment/Interventions Self-care/ADL training;Therapeutic exercise;Functional Mobility Training;Balance training;Splinting;Manual Therapy;Neuromuscular education;Aquatic Therapy;Ultrasound;Energy conservation;Therapeutic activities;Cryotherapy;Paraffin;DME and/or AE instruction;Cognitive remediation/compensation;Visual/perceptual remediation/compensation;Gait Training;Fluidtherapy;Electrical Stimulation;Moist Heat;Contrast Bath;Passive range of motion;Patient/family education   ? Plan continue with neuro re-ed   ? Consulted and Agree with Plan of Care Patient   ? ?  ?  ? ?  ? ? ?Patient will benefit from skilled therapeutic intervention in order to improve the following deficits and impairments:   ?Body Structure / Function / Physical Skills: ADL, Balance, Endurance, Mobility, Strength, UE functional use, FMC, Vision, Coordination, Gait, Decreased knowledge of precautions, GMC, ROM, Decreased knowledge of use of DME, Dexterity, IADL, Sensation, Tone, Body mechanics ?Cognitive Skills: Attention, Memory, Problem Solve, Safety Awareness, Thought, Understand ?  ? ? ?Visit Diagnosis: ?Hemiplegia and hemiparesis following cerebral infarction affecting left non-dominant side ([place]) ? ?Other symptoms and signs involving the nervous system ? ?Other disturbances of skin sensation ? ? ? ?Problem List ?Patient Active Problem List  ? Diagnosis Date Noted  ? Prediabetes 06/27/2021  ? Elevated glucose  10/13/2020  ? History of CVA (cerebrovascular accident) 01/19/2020  ? Normocytic anemia   ? Benign essential HTN   ? Hyponatremia   ? Acute blood loss anemia   ? Hypotension due to drugs   ? Vascular headache   ? Preren

## 2021-09-03 NOTE — Therapy (Signed)
?OUTPATIENT PHYSICAL THERAPY TREATMENT NOTE ? ? ?Patient Name: Dennis Mercado ?MRN: [id number] ?DOB:01/03/1981, 41 y.o., male ?Today's Date: 09/03/2021 ? ?PCP: Libby Maw, MD ?REFERRING PROVIDER: Libby Maw,* ? ? PT End of Session - 09/03/21 0801   ? ? Visit Number 6   ? Number of Visits 9   ? Date for PT Re-Evaluation 10/31/21   ? Authorization Type [employer] VL: 60 visits PT, 30 OT - Hard limit.   ? Authorization - Visit Number 5   ? Authorization - Number of Visits 60   ? PT Start Time 0801   ? Equipment Utilized During Treatment Gait belt   ? Activity Tolerance Patient tolerated treatment well   ? Behavior During Therapy [facility] for tasks assessed/performed   ? ?  ?  ? ?  ? ? ? ? ?History reviewed. No pertinent past medical history. ?Past Surgical History:  ?Procedure Laterality Date  ? WISDOM TOOTH EXTRACTION    ? ?Patient Active Problem List  ? Diagnosis Date Noted  ? Prediabetes 06/27/2021  ? Elevated glucose 10/13/2020  ? History of CVA (cerebrovascular accident) 01/19/2020  ? Normocytic anemia   ? Benign essential HTN   ? Hyponatremia   ? Acute blood loss anemia   ? Hypotension due to drugs   ? Vascular headache   ? Prerenal azotemia   ? Sleep disturbance   ? Leukocytosis   ? Transaminitis   ? Marijuana abuse   ? Essential hypertension   ? Dysphagia, post-stroke   ? Left hemiparesis ([place])   ? Acute respiratory failure ([place])   ? ICH (intracerebral hemorrhage) ([place]) 12/06/2019  ? ? ?REFERRING DIAG: R thalamic hemorrhage w/ L hemiparesis  ? ?THERAPY DIAG:  ?Hemiplegia and hemiparesis following cerebral infarction affecting left non-dominant side ([place]) ? ?Other abnormalities of gait and mobility ? ?Unsteadiness on feet ? ?Muscle weakness (generalized) ? ?PERTINENT HISTORY: No pertinent past medical history.  ? ?PRECAUTIONS: Fall  ? ?SUBJECTIVE: Patient reports no new changes/complaints. Has saw Dr. Joan Mayans since last visit, reports visit went well. Plans to go back in the last 6 weeks.   ? ?PAIN:  ?Are you having pain? No ? ? ?TODAY'S TREATMENT:  ? ?STRENGTHENING; ?Scifit level using BLE's only level 4.5 x 7 minutes for strengthening, ROM and activity tolerance. Pt able to maintain steps per minute > 75. Tolerating increase in resistance well.  ?Completed Eccentric Step Downs, with use of 6" and single UE support, completed 2 x 10 reps bilaterally. More challenge noted with LLE positioned on step, more UE support required.  ? ? ?GAIT: ?Gait pattern: step through pattern, decreased arm swing- Left, decreased step length- Right, decreased stance time- Left, decreased stride length, decreased hip/knee flexion- Left, and decreased trunk rotation ?Distance walked: clinic distance ?Assistive device utilized: Single point cane and None ?Level of assistance: SBA and CGA ?Comments: gait throughout session without AD ? ? ?BALANCE: ?Obstacle Negotiation: in // bars completed obstacle negotiation with step too pattern over 4" hurdles without UE support x 2 laps down and back, then transitioned to alternating step over pattern x 2 laps down andback, intermittent UE support required. Pt leading with LLE due to difficulty with clearance when trailing with LLE.  ?Rockerboard: standing on rockerboard positioned A/P with airex placed on top, completed static standing with EO x 30 seconds, then added in horizontal/vertical head turns x 10 reps. Then transitioned to board positioned laterall ? ? ? ? ? ?HOME EXERCISE PROGRAM: ?Access code [email or web address] ? ? PT  Short Term Goals   ? ?  ? PT SHORT TERM GOAL #1  ? Title Pt will decr TUG to 17.5 seconds or less in order to demo decr fall risk. ALL LTGS DUE 08/30/21   ? Baseline 18.97 seconds; 16.52 secs without AD  ? Time 4   ? Period Weeks   ? Status Achieved  ? Target Date 08/30/21   ?  ? PT SHORT TERM GOAL #2  ? Title Pt will ambulate over 100' of grass and unlevel surfaces with no AD and supervision in order to demo improved balance outdoors/in his yard.   ? Time 4   ?  Baseline 150 ft outdoors grass surface without AD and supervision.  ? Period Weeks   ? Status Achieved  ? ?  ?  ? ?  ? ? ? PT Long Term Goals -  ? ?  ? PT LONG TERM GOAL #1  ? Title Pt will be independent with final HEP for stretching, ROM, and balance. ALL LTGS DUE 09/27/21   ? Time 8   ? Period Weeks   ? Status New   ? Target Date 09/27/21   ?  ? PT LONG TERM GOAL #2  ? Title Pt will perform a curb outdoors with no AD with supervision for improved community access.   ? Time 8   ? Period Weeks   ? Status New   ?  ? PT LONG TERM GOAL #3  ? Title Pt will ambulate up/down inclines with supervision in order to demo improved gait with going in and out to his mailbox with no AD.   ? Time 8   ? Period Weeks   ? Status New   ?  ? PT LONG TERM GOAL #4  ? Title Pt will improve gait speed to at least 1.9 ft/sec with no AD in order to demo decr fall risk.   ? Baseline 1.74 ft/sec   ? Time 8   ? Period Weeks   ? Status New   ?  ? PT LONG TERM GOAL #5  ? Title Pt will improve condition 4 of mCTSIB to at least 12 seconds in order to demo improved vestibular input for balance.   ? Baseline condition 4= 3 seconds (feet slightly apart)   ? Time 8   ? Period Weeks   ? Status New   ? ?  ?  ? ?  ? ? ? Plan -   ? ? Clinical Impression Statement Continued today's skilled session focused on strengthening and reduced UE support with obstacle negotiation and step up/downs. Pt tolerating activities well, will continue per POC.   ? Personal Factors and Comorbidities Comorbidity 1;Past/Current Experience;Profession   ? Comorbidities HTN, right thalamic hemorrhage 12/06/2019   ? Examination-Activity Limitations Stand;Stairs;Squat;Transfers;Locomotion Level   ? Examination-Participation Restrictions Community Activity   walking his dog  ? Stability/Clinical Decision Making Stable/Uncomplicated   ? Rehab Potential Good   ? PT Frequency 1x / week   ? PT Duration 12 weeks   ? PT Treatment/Interventions ADLs/Self Care Home Management;Aquatic  Therapy;Electrical Stimulation;DME Instruction;Gait training;Stair training;Functional mobility training;Therapeutic activities;Therapeutic exercise;Balance training;Neuromuscular re-education;Wheelchair mobility training;Orthotic Fit/Training;Patient/family education;Passive range of motion;Energy conservation;Vestibular   ? PT Next Visit Plan Continue to work on gait with no AD, stepping over obstacles, going up/down curbs, gait up/down inclines. Balance on unlevel surfaces, SLS tasks with LLE   ? PT Home Exercise Plan [email or web address]   ? Consulted and Agree with Plan of Care Patient   ? ?  ?  ? ?  ? ? ? ? ?  Jones Bales, PT, DPT ?09/03/21 8:13 AM  ? ? ?   ?

## 2021-09-11 ENCOUNTER — Ambulatory Visit: Payer: Managed Care, Other (non HMO) | Admitting: Occupational Therapy

## 2021-09-11 ENCOUNTER — Ambulatory Visit: Payer: Managed Care, Other (non HMO) | Admitting: Physical Therapy

## 2021-09-11 ENCOUNTER — Other Ambulatory Visit: Payer: Self-pay

## 2021-09-11 ENCOUNTER — Encounter: Payer: Self-pay | Admitting: Physical Therapy

## 2021-09-11 DIAGNOSIS — R414 Neurologic neglect syndrome: Secondary | ICD-10-CM

## 2021-09-11 DIAGNOSIS — R29818 Other symptoms and signs involving the nervous system: Secondary | ICD-10-CM

## 2021-09-11 DIAGNOSIS — R2689 Other abnormalities of gait and mobility: Secondary | ICD-10-CM

## 2021-09-11 DIAGNOSIS — R2681 Unsteadiness on feet: Secondary | ICD-10-CM

## 2021-09-11 DIAGNOSIS — I69354 Hemiplegia and hemiparesis following cerebral infarction affecting left non-dominant side: Secondary | ICD-10-CM | POA: Diagnosis not present

## 2021-09-11 DIAGNOSIS — R208 Other disturbances of skin sensation: Secondary | ICD-10-CM

## 2021-09-11 DIAGNOSIS — M6281 Muscle weakness (generalized): Secondary | ICD-10-CM

## 2021-09-11 NOTE — Therapy (Addendum)
?OUTPATIENT PHYSICAL THERAPY TREATMENT NOTE ? ? ?Patient Name: Dennis Mercado ?MRN: [id number] ?DOB:1981-03-29, 41 y.o., male ?Today's Date: 09/11/2021 ? ?PCP: Libby Maw, MD ?REFERRING PROVIDER: Libby Maw,* ? ? PT End of Session - 09/11/21 0848   ? ? Visit Number 7   ? Number of Visits 9   ? Date for PT Re-Evaluation 10/31/21   ? Authorization Type [employer] VL: 60 visits PT, 30 OT - Hard limit.   ? Authorization - Visit Number 6   ? Authorization - Number of Visits 60   ? PT Start Time 0848   ? PT Stop Time [phone]   ? PT Time Calculation (min) 40 min   ? Equipment Utilized During Treatment Gait belt   ? Activity Tolerance Patient tolerated treatment well   ? Behavior During Therapy [facility] for tasks assessed/performed   ? ?  ?  ? ?  ? ? ? ? ?History reviewed. No pertinent past medical history. ?Past Surgical History:  ?Procedure Laterality Date  ? WISDOM TOOTH EXTRACTION    ? ?Patient Active Problem List  ? Diagnosis Date Noted  ? Prediabetes 06/27/2021  ? Elevated glucose 10/13/2020  ? History of CVA (cerebrovascular accident) 01/19/2020  ? Normocytic anemia   ? Benign essential HTN   ? Hyponatremia   ? Acute blood loss anemia   ? Hypotension due to drugs   ? Vascular headache   ? Prerenal azotemia   ? Sleep disturbance   ? Leukocytosis   ? Transaminitis   ? Marijuana abuse   ? Essential hypertension   ? Dysphagia, post-stroke   ? Left hemiparesis ([place])   ? Acute respiratory failure ([place])   ? ICH (intracerebral hemorrhage) ([place]) 12/06/2019  ? ? ?REFERRING DIAG: R thalamic hemorrhage w/ L hemiparesis  ? ?THERAPY DIAG:  ?Other symptoms and signs involving the nervous system ? ?Other abnormalities of gait and mobility ? ?Unsteadiness on feet ? ?PERTINENT HISTORY: No pertinent past medical history.  ? ?PRECAUTIONS: Fall  ? ?SUBJECTIVE: Ordered a step at home and reports that he has been trying to work on some step ups. Went for a mile walk on Saturday.  ? ?PAIN:  ?Are you having pain? No ? ? ?TODAY'S  TREATMENT:  ? ?STRENGTHENING; ?Scifit level using BLE's only level 4.5 x 7 minutes for strengthening, ROM and activity tolerance.  ?Completed Eccentric Step Downs with tapping R heel to floor, with use of 4" step and single UE support/fingertip support completed x10 reps with LLE. Pt with incr difficulty weight shifting to LLE and bringing RLE back onto the step.  ?With 4" step and air ex on top, step ups with LLE planted on step x10 reps and tapping RLE to step - needing mainly UE support and trying some reps without with min guard, x10reps with marching RLE with single UE support - cues to stand tall through LLE. Pt fatigued easily with this.  ? ? ?GAIT: ?Gait pattern: step through pattern, decreased arm swing- Left, decreased step length- Right, decreased stance time- Left, decreased stride length, decreased hip/knee flexion- Left, and decreased trunk rotation ?Distance walked: clinic distance ?Assistive device utilized: Single point cane and None ?Level of assistance: SBA and CGA ?Comments: gait throughout session without AD ? ? ?BALANCE: ?SLS on LLE: RLE taps to soccer ball x12 reps with primarily using minimal UE support, then placing RLE on soccer ball and rolling it forwards/back to midline 5 sets of 5 reps with UE support, cues to stand tall through  LLE.   ?Standing on air ex: with LLE as stance leg and 4" obstacle off to the R side x12 reps lateral step overs w/ RLE, w/ UE support > none ?With blue mat on floor: 2 yardsticks and one 2" obstacle performing alternating step overs with intermittent UE support down and back x3 reps, then performed with 2" and 4" obstacles with step to pattern and leading with LLE for incr hip/knee flexion. Pt needing intermittent UE support.  ? ? ? ? ? ?HOME EXERCISE PROGRAM: ?Access code [email or web address] ? ? PT Short Term Goals   ? ?  ? PT SHORT TERM GOAL #1  ? Title Pt will decr TUG to 17.5 seconds or less in order to demo decr fall risk. ALL LTGS DUE 08/30/21   ? Baseline 18.97  seconds; 16.52 secs without AD  ? Time 4   ? Period Weeks   ? Status Achieved  ? Target Date 08/30/21   ?  ? PT SHORT TERM GOAL #2  ? Title Pt will ambulate over 100' of grass and unlevel surfaces with no AD and supervision in order to demo improved balance outdoors/in his yard.   ? Time 4   ? Baseline 150 ft outdoors grass surface without AD and supervision.  ? Period Weeks   ? Status Achieved  ? ?  ?  ? ?  ? ? ? PT Long Term Goals -  ? ?  ? PT LONG TERM GOAL #1  ? Title Pt will be independent with final HEP for stretching, ROM, and balance. ALL LTGS DUE 09/27/21   ? Time 8   ? Period Weeks   ? Status New   ? Target Date 09/27/21   ?  ? PT LONG TERM GOAL #2  ? Title Pt will perform a curb outdoors with no AD with supervision for improved community access.   ? Time 8   ? Period Weeks   ? Status New   ?  ? PT LONG TERM GOAL #3  ? Title Pt will ambulate up/down inclines with supervision in order to demo improved gait with going in and out to his mailbox with no AD.   ? Time 8   ? Period Weeks   ? Status New   ?  ? PT LONG TERM GOAL #4  ? Title Pt will improve gait speed to at least 1.9 ft/sec with no AD in order to demo decr fall risk.   ? Baseline 1.74 ft/sec   ? Time 8   ? Period Weeks   ? Status New   ?  ? PT LONG TERM GOAL #5  ? Title Pt will improve condition 4 of mCTSIB to at least 12 seconds in order to demo improved vestibular input for balance.   ? Baseline condition 4= 3 seconds (feet slightly apart)   ? Time 8   ? Period Weeks   ? Status New   ? ?  ?  ? ?  ? ? ? Plan -   ? ? Clinical Impression Statement Continued to work on SLS tasks with incr weight bearing through LLE, obstacle negotiation, and LLE>RLE strengthening.  Pt initially needing UE support with SLS tasks, but then able to progress to no UE support during some reps.  Pt tolerating activities well, will continue per POC.   ? Personal Factors and Comorbidities Comorbidity 1;Past/Current Experience;Profession   ? Comorbidities HTN, right thalamic  hemorrhage 12/06/2019   ? Examination-Activity Limitations Stand;Stairs;Squat;Transfers;Locomotion Level   ?  Examination-Participation Restrictions Community Activity   walking his dog  ? Stability/Clinical Decision Making Stable/Uncomplicated   ? Rehab Potential Good   ? PT Frequency 1x / week   ? PT Duration 12 weeks   ? PT Treatment/Interventions ADLs/Self Care Home Management;Aquatic Therapy;Electrical Stimulation;DME Instruction;Gait training;Stair training;Functional mobility training;Therapeutic activities;Therapeutic exercise;Balance training;Neuromuscular re-education;Wheelchair mobility training;Orthotic Fit/Training;Patient/family education;Passive range of motion;Energy conservation;Vestibular   ? PT Next Visit Plan Continue to work on gait with no AD, stepping over obstacles, going up/down curbs, gait up/down inclines (try going outside??). Balance on unlevel surfaces, SLS tasks with LLE   ? PT Home Exercise Plan [email or web address]   ? Consulted and Agree with Plan of Care Patient   ? ?  ?  ? ?  ? ? ? ?Janann August, PT, DPT ?09/11/21 12:48 PM  ? ? ? ?   ?

## 2021-09-11 NOTE — Therapy (Signed)
[facility] [place] ?[address] ?[place], [place], [address] ?Phone: [phone]   Fax:  [phone] ? ?Occupational Therapy Treatment ? ?Patient Details  ?Name: Dennis Mercado ?MRN: [id number] ?Date of Birth: 1980-10-17 ?Referring Provider (OT): Dr. Letta Pate ? ? ?Encounter Date: 09/11/2021 ? ? OT End of Session - 09/11/21 0807   ? ? Visit Number 7   ? Number of Visits 12   ? Date for OT Re-Evaluation 10/25/21   ? Authorization Time Period per patient 30 visit OT limit   ? Authorization - Visit Number 7   ? Authorization - Number of Visits 30   ? OT Start Time 0805   ? OT Stop Time 0845   ? OT Time Calculation (min) 40 min   ? ?  ?  ? ?  ? ? ?No past medical history on file. ? ?Past Surgical History:  ?Procedure Laterality Date  ? WISDOM TOOTH EXTRACTION    ? ? ?There were no vitals filed for this visit. ? ? Subjective Assessment - 09/11/21 1226   ? ? Subjective  Denies pain   ? Pertinent History Pt. presented to ED on 12/06/2019 with left side weakness and  altered mental status s/p coitus.  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 Pt required intubation 6/22 due to ARF secondary to aspiration PNA. Received inpatient rehab. Pt was discharged 7/28/2, pt returns to OT after botox on  07/20/21 by Dr. Letta Pate   ? Limitations HTN, left inattention, apraxia   ? Patient Stated Goals to be able to use my hand   ? Currently in Pain? No/denies   ? ?  ?  ? ?  ? ? ? ?Treatment: Closed chain shoulder flexion in supine with PVC pipe frame, min facilitation/ v.c ?Prone on elbows lifting chest  then tall kneeling weightbearing ball to roll forwards and backwards, mod facilitation for weight shift through LLE. Prone over bar weight shifting forwards and backwards for weight shift through LLE. ?Standing with both hands on parallel bars rocking forwards and backwards  and for lateral weight  shifts, min-mod facilitation (initally attempted holding onto chair back). ? ? ? ? ? ? ? ? ? ? ? ? ? ? ? ? ? ? ? ? ? OT Short Term Goals - 09/03/21 0917   ? ?  ? OT SHORT TERM GOAL #1  ? Title I with updated HEP   ? Time 6   ? Period Weeks   ? Status Achieved   ?  ? OT SHORT TERM GOAL #2  ? Title Pt will demonstrate reach to 60*shoulder flexion with -15 elbow extension and no more than min compensations,   ? Baseline shoulder flexion to 105* with mod compensations and  significant elbow flexion present   ? Time 6   ? Period Weeks   ? Status Achieved   09/03/21:  80* with -15* elbow ext, and IR noted  ?  ? OT SHORT TERM GOAL #3  ? Title Pt will demonstrate A/ROM wrist extension to -10 for improved functional use.   ? Time 6   ? Period Weeks   ? Status Achieved   09/03/21:  AROM to neutral  ?  ? OT SHORT TERM GOAL #4  ? Title I with positioning/ splint wear, care and precautions to  minimize risk for contracture.   ? Time 6   ? Period Weeks   ? Status  Achieved   ?  ? OT SHORT TERM GOAL #5  ? Title Pt will incorporate LUE into functional activities as a stabilizer/ gross A at least 30% of the time.   ? Time 6   ? Period Weeks   ? Status On-going   09/03/21:  not fully met, but pt reports using LUE more  ? ?  ?  ? ?  ? ? ? ? OT Long Term Goals - 09/11/21 0826   ? ?  ? OT LONG TERM GOAL #1  ? Title Pt will use LUE as a stabilizer/ gross assist at least 40% of the time.   ? Baseline uses 20%   ? Time 12   ? Period Weeks   ? Status New   ? Target Date 10/25/21   ?  ? OT LONG TERM GOAL #2  ? Title Patient will demonstrate reach to 70 degrees of shoulder flexion with -10 elbow extension and no more than min compensation   ? Time 12   ? Period Weeks   ? Status New   ?  ? OT LONG TERM GOAL #3  ? Title Pt will demonstrate ability to actively release an item than has been placed in left hand 50% of the time.   ? Time 12   ? Period Weeks   ? Status New   ?  ? OT LONG TERM GOAL #4  ? Title Patient will incorporate self stretching  techniques throughout the day whether at home or work.   ? Time 12   ? Period Weeks   ? Status New   ?  ? OT LONG TERM GOAL #5  ? Title Pt will demonstrate at least 80% A/ROM foream supination  following stretching in prep for improved functional use.   ? Time 12   ? Period Weeks   ? Status New   ? ?  ?  ? ?  ? ? ? ? ? ? ? ? Plan - 09/11/21 1226   ? ? Clinical Impression Statement Pt is progressing towards goals with improving shoulder positioning and incr elbow ext. Pt's spasticity in the hand limits pt's functional use. Therapist discussed with pt that he only has 2 more scheduled visits as we are saving therapy visits for later in the calendar year or after botox.   ? OT Occupational Profile and History Detailed Assessment- Review of Records and additional review of physical, cognitive, psychosocial history related to current functional performance   ? Occupational performance deficits (Please refer to evaluation for details): ADL's;IADL's;Leisure;Social Participation;Work;Play   ? Body Structure / Function / Physical Skills ADL;Balance;Endurance;Mobility;Strength;UE functional use;FMC;Vision;Coordination;Gait;Decreased knowledge of precautions;GMC;ROM;Decreased knowledge of use of DME;Dexterity;IADL;Sensation;Tone;Body mechanics   ? Cognitive Skills Attention;Memory;Problem Solve;Safety Awareness;Thought;Understand   ? Rehab Potential Good   ? Clinical Decision Making Limited treatment options, no task modification necessary   ? Comorbidities Affecting Occupational Performance: May have comorbidities impacting occupational performance   ? Modification or Assistance to Complete Evaluation  No modification of tasks or assist necessary to complete eval   ? OT Frequency 1x / week   ? OT Duration 12 weeks   ? OT Treatment/Interventions Self-care/ADL training;Therapeutic exercise;Functional Mobility Training;Balance training;Splinting;Manual Therapy;Neuromuscular education;Aquatic Therapy;Ultrasound;Energy  conservation;Therapeutic activities;Cryotherapy;Paraffin;DME and/or AE instruction;Cognitive remediation/compensation;Visual/perceptual remediation/compensation;Gait Training;Fluidtherapy;Electrical Stimulation;Moist Heat;Contrast Bath;Passive range of motion;Patient/family education   ? Plan continue with neuro re-ed, weightbearing  and use of LUE as a stabilizer/, gross A, as able, start checking goals, pt has 2 more scheduled visits.  ? Consulted and  Agree with Plan of Care Patient   ? ?  ?  ? ?  ? ? ?Patient will benefit from skilled therapeutic intervention in order to improve the following deficits and impairments:   ?Body Structure / Function / Physical Skills: ADL, Balance, Endurance, Mobility, Strength, UE functional use, FMC, Vision, Coordination, Gait, Decreased knowledge of precautions, GMC, ROM, Decreased knowledge of use of DME, Dexterity, IADL, Sensation, Tone, Body mechanics ?Cognitive Skills: Attention, Memory, Problem Solve, Safety Awareness, Thought, Understand ?  ? ? ?Visit Diagnosis: ?Other symptoms and signs involving the nervous system ? ?Other abnormalities of gait and mobility ? ?Unsteadiness on feet ? ?Other disturbances of skin sensation ? ?Muscle weakness (generalized) ? ?Neurologic neglect syndrome ? ? ? ?Problem List ?Patient Active Problem List  ? Diagnosis Date Noted  ? Prediabetes 06/27/2021  ? Elevated glucose 10/13/2020  ? History of CVA (cerebrovascular accident) 01/19/2020  ? Normocytic anemia   ? Benign essential HTN   ? Hyponatremia   ? Acute blood loss anemia   ? Hypotension due to drugs   ? Vascular headache   ? Prerenal azotemia   ? Sleep disturbance   ? Leukocytosis   ? Transaminitis   ? Marijuana abuse   ? Essential hypertension   ? Dysphagia, post-stroke   ? Left hemiparesis ([place])   ? Acute respiratory failure ([place])   ? ICH (intracerebral hemorrhage) ([place]) 12/06/2019  ? ? ?Annagrace Carr, OT ?09/11/2021, 12:30 PM ? ?[facility] [place] ?[address] ?[place], [place], [address] ?Phone: [phone]   Fax:  [phone] ? ?Name: Djon Tith ?MRN: [id number] ?Date of Birth: [birth year] ? ?

## 2021-09-17 ENCOUNTER — Ambulatory Visit: Payer: Managed Care, Other (non HMO) | Attending: Family Medicine | Admitting: Physical Therapy

## 2021-09-17 ENCOUNTER — Ambulatory Visit: Payer: Managed Care, Other (non HMO) | Admitting: Occupational Therapy

## 2021-09-17 ENCOUNTER — Encounter: Payer: Self-pay | Admitting: Physical Therapy

## 2021-09-17 DIAGNOSIS — R2689 Other abnormalities of gait and mobility: Secondary | ICD-10-CM | POA: Insufficient documentation

## 2021-09-17 DIAGNOSIS — R2681 Unsteadiness on feet: Secondary | ICD-10-CM | POA: Insufficient documentation

## 2021-09-17 DIAGNOSIS — R414 Neurologic neglect syndrome: Secondary | ICD-10-CM | POA: Diagnosis present

## 2021-09-17 DIAGNOSIS — R41842 Visuospatial deficit: Secondary | ICD-10-CM | POA: Diagnosis present

## 2021-09-17 DIAGNOSIS — M6281 Muscle weakness (generalized): Secondary | ICD-10-CM | POA: Insufficient documentation

## 2021-09-17 DIAGNOSIS — R29818 Other symptoms and signs involving the nervous system: Secondary | ICD-10-CM

## 2021-09-17 DIAGNOSIS — I69354 Hemiplegia and hemiparesis following cerebral infarction affecting left non-dominant side: Secondary | ICD-10-CM | POA: Insufficient documentation

## 2021-09-17 DIAGNOSIS — R208 Other disturbances of skin sensation: Secondary | ICD-10-CM | POA: Insufficient documentation

## 2021-09-17 NOTE — Therapy (Signed)
?OUTPATIENT PHYSICAL THERAPY TREATMENT NOTE ? ? ?Patient Name: Dennis Mercado ?MRN: [id number] ?DOB:04/06/1981, 41 y.o., male ?Today's Date: 09/17/2021 ? ?PCP: Libby Maw, MD ?REFERRING PROVIDER: Libby Maw,* ? ? PT End of Session - 09/17/21 [phone]   ? ? Visit Number 8   ? Number of Visits 9   ? Date for PT Re-Evaluation 10/31/21   ? Authorization Type [employer] VL: 60 visits PT, 30 OT - Hard limit.   ? Authorization - Visit Number 7   ? Authorization - Number of Visits 60   ? PT Start Time [phone]   pt in restroom at start of session  ? PT Stop Time 0845   ? PT Time Calculation (min) 39 min   ? Equipment Utilized During Treatment Gait belt   ? Activity Tolerance Patient tolerated treatment well   ? Behavior During Therapy [facility] for tasks assessed/performed   ? ?  ?  ? ?  ? ? ? ? ?History reviewed. No pertinent past medical history. ?Past Surgical History:  ?Procedure Laterality Date  ? WISDOM TOOTH EXTRACTION    ? ?Patient Active Problem List  ? Diagnosis Date Noted  ? Prediabetes 06/27/2021  ? Elevated glucose 10/13/2020  ? History of CVA (cerebrovascular accident) 01/19/2020  ? Normocytic anemia   ? Benign essential HTN   ? Hyponatremia   ? Acute blood loss anemia   ? Hypotension due to drugs   ? Vascular headache   ? Prerenal azotemia   ? Sleep disturbance   ? Leukocytosis   ? Transaminitis   ? Marijuana abuse   ? Essential hypertension   ? Dysphagia, post-stroke   ? Left hemiparesis ([place])   ? Acute respiratory failure ([place])   ? ICH (intracerebral hemorrhage) ([place]) 12/06/2019  ? ? ?REFERRING DIAG: R thalamic hemorrhage w/ L hemiparesis  ? ?THERAPY DIAG:  ?Other symptoms and signs involving the nervous system ? ?Other abnormalities of gait and mobility ? ?Unsteadiness on feet ? ?PERTINENT HISTORY: No pertinent past medical history.  ? ?PRECAUTIONS: Fall  ? ?SUBJECTIVE: Nothing new since he was last here.  ? ?PAIN:  ?Are you having pain? No ? ? ?TODAY'S TREATMENT:  ? ?STRENGTHENING: ?Scifit level using  BLE's only level 4.5 x 7 minutes for strengthening, ROM and activity tolerance.  ? ? ?GAIT: ?Gait pattern: step through pattern, decreased arm swing- Left, decreased step length- Right, decreased stance time- Left, decreased stride length, decreased hip/knee flexion- Left, and decreased trunk rotation ?Distance walked: 300' ?Assistive device utilized: Single point cane and None ?Level of assistance: SBA and CGA ?Comments:Outdoor gait on grass/gravel surfaces with no AD. Pt with slowed gait speed, but able to maintain balance.  ? ?RAMP:  ?Level of Assistance: CGA progressing to SBA  ?Assistive device utilized: None ?Ramp Comments: On ramp inside therapy gym, performed x4 reps ascending/descending with initially CGA when descending, then performed with blue mat for compliant surface x3 reps. Pt with more slowed pace and more cautious when descending.  ? ?CURB:  ?Level of Assistance: SBA ?Assistive device utilized: Single point cane and None ?Curb Comments: performed x2 reps with cane, x5 reps without with ascending with RLE and descending with LLE. Performed a few reps where pt had to ambulate ~10' and then try to step up onto curb with a more fluid motion vs. Stopping and stepping on. Pt with no LOB performing outdoors. Initially hesistant when stepping down with LLE, but improved with incr reps when pt build up his confidence.  ? ?  Performed an additional 7 reps on indoor curb (6.5") beginning with use of SPC (to build up confidence) and then no AD, pt needing CGA.  ? ? ? ? ? ?BALANCE: ?Rockerboard: with LLE as stance leg; stepping RLE off forwards and back on x10 reps, then stepping RLE off forwards and backwards x10 reps. Pt needing UE support for balance, cues for quad activation during stance time. Balance with eyes closed multiple reps with UE support as needed, able to hold for 5-10 secs.  ? ? ? ? ? ? ?HOME EXERCISE PROGRAM: ?Access code [email or web address] ? ? PT Short Term Goals   ? ?  ? PT SHORT TERM GOAL #1  ? Title  Pt will decr TUG to 17.5 seconds or less in order to demo decr fall risk. ALL LTGS DUE 08/30/21   ? Baseline 18.97 seconds; 16.52 secs without AD  ? Time 4   ? Period Weeks   ? Status Achieved  ? Target Date 08/30/21   ?  ? PT SHORT TERM GOAL #2  ? Title Pt will ambulate over 100' of grass and unlevel surfaces with no AD and supervision in order to demo improved balance outdoors/in his yard.   ? Time 4   ? Baseline 150 ft outdoors grass surface without AD and supervision.  ? Period Weeks   ? Status Achieved  ? ?  ?  ? ?  ? ? ? PT Long Term Goals -  ? ?  ? PT LONG TERM GOAL #1  ? Title Pt will be independent with final HEP for stretching, ROM, and balance. ALL LTGS DUE 09/27/21   ? Time 8   ? Period Weeks   ? Status New   ? Target Date 09/27/21   ?  ? PT LONG TERM GOAL #2  ? Title Pt will perform a curb outdoors with no AD with supervision for improved community access.   ? Time 8   ? Period Weeks   ? Status New   ?  ? PT LONG TERM GOAL #3  ? Title Pt will ambulate up/down inclines with supervision in order to demo improved gait with going in and out to his mailbox with no AD.   ? Time 8   ? Period Weeks   ? Status New   ?  ? PT LONG TERM GOAL #4  ? Title Pt will improve gait speed to at least 1.9 ft/sec with no AD in order to demo decr fall risk.   ? Baseline 1.74 ft/sec   ? Time 8   ? Period Weeks   ? Status New   ?  ? PT LONG TERM GOAL #5  ? Title Pt will improve condition 4 of mCTSIB to at least 12 seconds in order to demo improved vestibular input for balance.   ? Baseline condition 4= 3 seconds (feet slightly apart)   ? Time 8   ? Period Weeks   ? Status New   ? ?  ?  ? ?  ? ? ? Plan -   ? ? Clinical Impression Statement Worked on gait on inclined surfaces, compliant surfaces (grass, gravel) and blocked practice of curb training. With curbs, began with use of cane to build up confidence and then performed with no AD. Pt needing supervision with curbs outdoors, but needing CGA when descending indoors from a higher  curb (6.5"). Pt tolerating activities well, will continue per POC.   ? Personal Factors and Comorbidities Comorbidity 1;Past/Current Experience;Profession   ?  Comorbidities HTN, right thalamic hemorrhage 12/06/2019   ? Examination-Activity Limitations Stand;Stairs;Squat;Transfers;Locomotion Level   ? Examination-Participation Restrictions Community Activity   walking his dog  ? Stability/Clinical Decision Making Stable/Uncomplicated   ? Rehab Potential Good   ? PT Frequency 1x / week   ? PT Duration 12 weeks   ? PT Treatment/Interventions ADLs/Self Care Home Management;Aquatic Therapy;Electrical Stimulation;DME Instruction;Gait training;Stair training;Functional mobility training;Therapeutic activities;Therapeutic exercise;Balance training;Neuromuscular re-education;Wheelchair mobility training;Orthotic Fit/Training;Patient/family education;Passive range of motion;Energy conservation;Vestibular   ? PT Next Visit Plan Check LTGs- likely d/c? ?Continue to work on gait with no AD, stepping over obstacles, going up/down curbs, gait up/down inclines (try going outside??). Balance on unlevel surfaces, SLS tasks with LLE   ? PT Home Exercise Plan [email or web address]   ? Consulted and Agree with Plan of Care Patient   ? ?  ?  ? ?  ? ? ? ?Janann August, PT, DPT ?09/17/21 9:49 AM  ? ? ? ?   ?

## 2021-09-17 NOTE — Therapy (Signed)
[facility] ?[place], [place], [address] ?Phone: [phone]   Fax:  [phone] ? ?Occupational Therapy Treatment ? ?Patient Details  ?Name: Dennis Mercado ?MRN: [id number] ?Date of Birth: 10-03-1980 ?Referring Provider (OT): Dr. Letta Pate ? ? ?Encounter Date: 09/17/2021 ? ? OT End of Session - 09/17/21 3557   ? ? Visit Number 8   ? Number of Visits 12   ? Date for OT Re-Evaluation 10/25/21   ? Authorization Time Period per patient 30 visit OT limit   ? Authorization - Visit Number 8   ? Authorization - Number of Visits 30   ? OT Start Time 0845   ? OT Stop Time 0930   ? OT Time Calculation (min) 45 min   ? Activity Tolerance Patient tolerated treatment well   ? Behavior During Therapy [facility] for tasks assessed/performed   ? ?  ?  ? ?  ? ? ?No past medical history on file. ? ?Past Surgical History:  ?Procedure Laterality Date  ? WISDOM TOOTH EXTRACTION    ? ? ?There were no vitals filed for this visit. ? ? Subjective Assessment - 09/17/21 0851   ? ? Subjective  Denies pain   ? Pertinent History Pt. presented to ED on 12/06/2019 with left side weakness and  altered mental status s/p coitus.  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 Pt required intubation 6/22 due to ARF secondary to aspiration PNA. Received inpatient rehab. Pt was discharged 7/28/2, pt returns to OT after botox on  07/20/21 by Dr. Letta Pate   ? Limitations HTN, left inattention, apraxia   ? Patient Stated Goals to be able to use my hand   ? Currently in Pain? No/denies   ? ?  ?  ? ?  ? ? ? ?Prone on elbows w/ chest lift to increase wt bearing through elbows, wt shifts to Lt side while sliding Rt arm out/back in.  ? ?Quadraped over red physioball (dome for Lt hand) with A/P wt shifts and side to side wt shifts ? ?Tall kneeling over large blue physioball while rolling ball out w/ short kneeling,  and rolling ball back in as pt comes up to tall kneeling. Also did same w/ diagonal patterns ? ?Seated: wt bearing over LUE w/ RUE reaching (Lt lateral trunk flexion), followed by bilateral trunk rotation ? ?Began assessing remaining goals in prep for d/c - see below.  ? ?Pt issued HEP for self stretches - see pt instructions ? ? ? ? ? ? ? ? ? ? ? ? ? ? ? ? ? ? ? OT Education - 09/17/21 0933   ? ? Education Details HEP for stretching (#1-3 while working, #4 at home)   ? Person(s) Educated Patient   ? Methods Explanation;Demonstration;Verbal cues;Handout   ? Comprehension Verbalized understanding;Returned demonstration;Verbal cues required   ? ?  ?  ? ?  ? ? ? OT Short Term Goals - 09/17/21 0937   ? ?  ? OT SHORT TERM GOAL #1  ? Title I with updated HEP   ? Time 6   ? Period Weeks   ? Status Achieved   ?  ? OT SHORT TERM GOAL #2  ? Title Pt will demonstrate reach to 60*shoulder flexion with -15 elbow extension and no more than min compensations,   ? Baseline shoulder flexion to 105* with mod compensations and  significant elbow flexion present   ?  Time 6   ? Period Weeks   ? Status Achieved   09/03/21:  80* with -15* elbow ext, and IR noted  ?  ? OT SHORT TERM GOAL #3  ? Title Pt will demonstrate A/ROM wrist extension to -10 for improved functional use.   ? Time 6   ? Period Weeks   ? Status Achieved   09/03/21:  AROM to neutral  ?  ? OT SHORT TERM GOAL #4  ? Title I with positioning/ splint wear, care and precautions to  minimize risk for contracture.   ? Time 6   ? Period Weeks   ? Status Achieved   ?  ? OT SHORT TERM GOAL #5  ? Title Pt will incorporate LUE into functional activities as a stabilizer/ gross A at least 30% of the time.   ? Time 6   ? Period Weeks   ? Status On-going   09/03/21:  not fully met, but pt reports using LUE more  ? ?  ?  ? ?  ? ? ? ? OT Long Term Goals - 09/17/21 0937   ? ?  ? OT LONG TERM GOAL #1  ? Title Pt will use LUE as a stabilizer/ gross assist at least 40% of the time.   ? Baseline  uses 20%   ? Time 12   ? Period Weeks   ? Status Not Met   per pt report - still 10-20%  ? Target Date 10/25/21   ?  ? OT LONG TERM GOAL #2  ? Title Patient will demonstrate reach to 70 degrees of shoulder flexion with -10 elbow extension and no more than min compensation   ? Time 12   ? Period Weeks   ? Status Partially Met   only w/ IR and more scaption range vs. true flexion (no functional use)  ?  ? OT LONG TERM GOAL #3  ? Title Pt will demonstrate ability to actively release an item than has been placed in left hand 50% of the time.   ? Time 12   ? Period Weeks   ? Status Not Met   inconsistent with small objects only  ?  ? OT LONG TERM GOAL #4  ? Title Patient will incorporate self stretching techniques throughout the day whether at home or work.   ? Time 12   ? Period Weeks   ? Status On-going   issued 09/17/21  ?  ? OT LONG TERM GOAL #5  ? Title Pt will demonstrate at least 80% A/ROM foream supination  following stretching in prep for improved functional use.   ? Time 12   ? Period Weeks   ? Status New   ? ?  ?  ? ?  ? ? ? ? ? ? ? ? Plan - 09/17/21 0936   ? ? Clinical Impression Statement Pt is progressing towards goals with improving shoulder positioning and incr elbow ext.  However, L hand spasticity continues to impact LUE functional use.   ? OT Occupational Profile and History Detailed Assessment- Review of Records and additional review of physical, cognitive, psychosocial history related to current functional performance   ? Occupational performance deficits (Please refer to evaluation for details): ADL's;IADL's;Leisure;Social Participation;Work;Play   ? Body Structure / Function / Physical Skills ADL;Balance;Endurance;Mobility;Strength;UE functional use;FMC;Vision;Coordination;Gait;Decreased knowledge of precautions;GMC;ROM;Decreased knowledge of use of DME;Dexterity;IADL;Sensation;Tone;Body mechanics   ? Cognitive Skills Attention;Memory;Problem Solve;Safety Awareness;Thought;Understand   ? Rehab  Potential Good   ? Clinical Decision Making  Limited treatment options, no task modification necessary   ? Comorbidities Affecting Occupational Performance: May have comorbidities impacting occupational performance   ? Modification or Assistance to Complete Evaluation  No modification of tasks or assist necessary to complete eval   ? OT Frequency 1x / week   ? OT Duration 12 weeks   ? OT Treatment/Interventions Self-care/ADL training;Therapeutic exercise;Functional Mobility Training;Balance training;Splinting;Manual Therapy;Neuromuscular education;Aquatic Therapy;Ultrasound;Energy conservation;Therapeutic activities;Cryotherapy;Paraffin;DME and/or AE instruction;Cognitive remediation/compensation;Visual/perceptual remediation/compensation;Gait Training;Fluidtherapy;Electrical Stimulation;Moist Heat;Contrast Bath;Passive range of motion;Patient/family education   ? Plan continue with neuro re-ed. finish checking goals and d/c   ? Consulted and Agree with Plan of Care Patient   ? ?  ?  ? ?  ? ? ?Patient will benefit from skilled therapeutic intervention in order to improve the following deficits and impairments:   ?Body Structure / Function / Physical Skills: ADL, Balance, Endurance, Mobility, Strength, UE functional use, FMC, Vision, Coordination, Gait, Decreased knowledge of precautions, GMC, ROM, Decreased knowledge of use of DME, Dexterity, IADL, Sensation, Tone, Body mechanics ?Cognitive Skills: Attention, Memory, Problem Solve, Safety Awareness, Thought, Understand ?  ? ? ?Visit Diagnosis: ?Hemiplegia and hemiparesis following cerebral infarction affecting left non-dominant side ([place]) ? ?Muscle weakness (generalized) ? ?Other symptoms and signs involving the nervous system ? ? ? ?Problem List ?Patient Active Problem List  ? Diagnosis Date Noted  ? Prediabetes 06/27/2021  ? Elevated glucose 10/13/2020  ? History of CVA (cerebrovascular accident) 01/19/2020  ? Normocytic anemia   ? Benign essential HTN   ?  Hyponatremia   ? Acute blood loss anemia   ? Hypotension due to drugs   ? Vascular headache   ? Prerenal azotemia   ? Sleep disturbance   ? Leukocytosis   ? Transaminitis   ? Marijuana abuse   ? Essential hypertensi

## 2021-09-17 NOTE — Patient Instructions (Signed)
1) PELVIC TILT: Anterior ? ? ? ?Start in slumped position. Roll pelvis forward to arch back. 5-10___ reps per set, every 30 minutes while working ? ?2) Hold Lt wrist w/ Rt hand (thumb side up) and reach slowly towards floor, hold 5 sec, repeat 5-10 times, every 30 minutes ? ?3) Hold Lt wrist w/ Rt hand (thumb side up), sit up tall, rotate trunk to RT, then slowly to Lt, Repeat 5 times each side, every 30 minutes ? ?4) Flexion (Assistive) ? ? ? ?Laying down, hold Lt wrist with Rt hand (thumb side up), keeping elbows as straight as possible. Repeat __10__ times. Do _2-3___ sessions per day. ? ?Copyright ? [employer]. All rights reserved.  ? ? ? ?

## 2021-09-20 ENCOUNTER — Telehealth: Payer: Self-pay

## 2021-09-20 ENCOUNTER — Other Ambulatory Visit: Payer: Self-pay | Admitting: Family Medicine

## 2021-09-20 DIAGNOSIS — E785 Hyperlipidemia, unspecified: Secondary | ICD-10-CM

## 2021-09-20 NOTE — Telephone Encounter (Signed)
[name] called to see if  his Botox/Dysport has been approved?  ? ?Patient has been advised  it has not been checked on as of yet. And you will let him know before the appointment.  ?Call back phone [phone]. ?

## 2021-09-25 ENCOUNTER — Ambulatory Visit: Payer: Managed Care, Other (non HMO) | Admitting: Occupational Therapy

## 2021-09-25 ENCOUNTER — Ambulatory Visit: Payer: Managed Care, Other (non HMO)

## 2021-09-25 DIAGNOSIS — R414 Neurologic neglect syndrome: Secondary | ICD-10-CM

## 2021-09-25 DIAGNOSIS — R29818 Other symptoms and signs involving the nervous system: Secondary | ICD-10-CM | POA: Diagnosis not present

## 2021-09-25 DIAGNOSIS — I69354 Hemiplegia and hemiparesis following cerebral infarction affecting left non-dominant side: Secondary | ICD-10-CM

## 2021-09-25 DIAGNOSIS — R2689 Other abnormalities of gait and mobility: Secondary | ICD-10-CM

## 2021-09-25 DIAGNOSIS — R41842 Visuospatial deficit: Secondary | ICD-10-CM

## 2021-09-25 DIAGNOSIS — R208 Other disturbances of skin sensation: Secondary | ICD-10-CM

## 2021-09-25 DIAGNOSIS — M6281 Muscle weakness (generalized): Secondary | ICD-10-CM

## 2021-09-25 DIAGNOSIS — R2681 Unsteadiness on feet: Secondary | ICD-10-CM

## 2021-09-25 NOTE — Therapy (Signed)
[facility] ?[place], [place], [address] ?Phone: [phone]   Fax:  [phone] ? ?Occupational Therapy Treatment/ Discharge ? ?Patient Details  ?Name: Dennis Mercado ?MRN: [id number] ?Date of Birth: Jun 15, 1981 ?Referring Provider (OT): Dr. Letta Pate ? ? ?Encounter Date: 09/25/2021 ? ?OCCUPATIONAL THERAPY DISCHARGE SUMMARY ? ? ? ?Current functional level related to goals / functional outcomes: ?Pt made good overall progress, however he did not fully achieve all goals due to severity of deficits ?  ?Remaining deficits: ?Spasticity, decreased LUE functional use, muscle weakness, decreased coordination ?  ?Education / Equipment: ?Pt was educated regarding HEP. He demonstrates understanding.  ? ?Patient agrees to discharge. Patient goals were partially met. Patient is being discharged due to  maximized progress at this time. Pt desires to save additional OT visits for after receiving botox to LUE. Pt has a 30 visit limit for the year. Pt may benefit from referral to OT post botox.. ? ?  ? ? OT End of Session - 09/25/21 0804   ? ? Visit Number 9   ? Number of Visits 12   ? Date for OT Re-Evaluation 10/25/21   ? Authorization Time Period per patient 30 visit OT limit   ? Authorization - Visit Number 9   ? Authorization - Number of Visits 30   ? OT Start Time [phone]   ? OT Stop Time 0841   ? OT Time Calculation (min) 38 min   ? Activity Tolerance Patient tolerated treatment well   ? Behavior During Therapy [facility] for tasks assessed/performed   ? ?  ?  ? ?  ? ? ?No past medical history on file. ? ?Past Surgical History:  ?Procedure Laterality Date  ? WISDOM TOOTH EXTRACTION    ? ? ?There were no vitals filed for this visit. ? ? Subjective Assessment - 09/25/21 0803   ? ? Pertinent History Pt. presented to ED on 12/06/2019 with left side weakness and  altered mental status s/p coitus.  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 Pt required intubation 6/22 due to ARF secondary to aspiration PNA. Received inpatient rehab. Pt was discharged 7/28/2, pt returns to OT after botox on  07/20/21 by Dr. Letta Pate   ? Limitations HTN, left inattention, apraxia   ? Patient Stated Goals to be able to use my hand   ? Currently in Pain? No/denies   ? ?  ?  ? ?  ? ? ? ? ? ? ? ? ?Treatment: Supine closed chain chest press and shoulder flexion with PVC pipe frame, min facilitation/v.c ?Seated at table passive self stretch for passive supination/ pronation, and wrist flexion extension, pt returned demonstration. ? ? ? ? ? ? ? ? ? ? ? ? ? ? ? OT Education - 09/25/21 0824   ? ? Education Details HEPissued last visit for stretching reviewed by therapist, therapist added wrist flexion/extension self stretch, therapist checked progress towards goals and discussed plans for d/c   ? Person(s) Educated Patient   ? Methods Explanation;Demonstration;Verbal cues   ? Comprehension Verbalized understanding;Returned demonstration;Verbal cues required   ? ?  ?  ? ?  ? ? ? OT Short Term Goals - 09/25/21 0808   ? ?  ? OT SHORT TERM GOAL #1  ? Title I with updated HEP   ? Time 6   ? Period Weeks   ? Status Achieved   ?  ?  OT SHORT TERM GOAL #2  ? Title Pt will demonstrate reach to 60*shoulder flexion with -15 elbow extension and no more than min compensations,   ? Baseline shoulder flexion to 105* with mod compensations and  significant elbow flexion present   ? Time 6   ? Period Weeks   ? Status Achieved   09/03/21:  80* with -15* elbow ext, and IR noted  ?  ? OT SHORT TERM GOAL #3  ? Title Pt will demonstrate A/ROM wrist extension to -10 for improved functional use.   ? Time 6   ? Period Weeks   ? Status Achieved   09/03/21:  AROM to neutral  ?  ? OT SHORT TERM GOAL #4  ? Title I with positioning/ splint wear, care and precautions to  minimize risk for contracture.   ? Time 6   ? Period Weeks   ? Status Achieved   ?  ?  OT SHORT TERM GOAL #5  ? Title Pt will incorporate LUE into functional activities as a stabilizer/ gross A at least 30% of the time.   ? Time 6   ? Period Weeks   ? Status Achieved   Pt reports 30-40%x-09/25/21  ? ?  ?  ? ?  ? ? ? ? OT Long Term Goals - 09/25/21 0809   ? ?  ? OT LONG TERM GOAL #1  ? Title Pt will use LUE as a stabilizer/ gross assist at least 40% of the time.   ? Baseline uses 20%   ? Time 12   ? Period Weeks   ? Status Not Met   30-40%  ? Target Date 10/25/21   ?  ? OT LONG TERM GOAL #2  ? Title Patient will demonstrate reach to 70 degrees of shoulder flexion with -10 elbow extension and no more than min compensation   ? Time 12   ? Period Weeks   ? Status Partially Met   only w/ IR and more scaption range vs. true flexion (no functional use)  ?  ? OT LONG TERM GOAL #3  ? Title Pt will demonstrate ability to actively release an item than has been placed in left hand 50% of the time.   ? Time 12   ? Period Weeks   ? Status Not Met   inconsistent with small objects only  ?  ? OT LONG TERM GOAL #4  ? Title Patient will incorporate self stretching techniques throughout the day whether at home or work.   ? Time 12   ? Period Weeks   ? Status Achieved   issued 09/17/21  ?  ? OT LONG TERM GOAL #5  ? Title Pt will demonstrate at least 80% A/ROM foream supination  following stretching in prep for improved functional use.   ? Time 12   ? Period Weeks   ? Status Not Met   grossly 50%  ? ?  ?  ? ?  ? ? ? ? ? ? ? ? Plan - 09/25/21 0906   ? ? Clinical Impression Statement Pt is demonstrates progress towards goals. He met several goals however he did not fully achieve all goals due to severity of deficits and spasticity.   ? OT Occupational Profile and History Detailed Assessment- Review of Records and additional review of physical, cognitive, psychosocial history related to current functional performance   ? Occupational performance deficits (Please refer to evaluation for details): ADL's;IADL's;Leisure;Social  Participation;Work;Play   ? Body Structure /  Function / Physical Skills ADL;Balance;Endurance;Mobility;Strength;UE functional use;FMC;Vision;Coordination;Gait;Decreased knowledge of precautions;GMC;ROM;Decreased knowledge of use of DME;Dexterity;IADL;Sensation;Tone;Body mechanics   ? Cognitive Skills Attention;Memory;Problem Solve;Safety Awareness;Thought;Understand   ? Rehab Potential Good   ? Clinical Decision Making Limited treatment options, no task modification necessary   ? Comorbidities Affecting Occupational Performance: May have comorbidities impacting occupational performance   ? Modification or Assistance to Complete Evaluation  No modification of tasks or assist necessary to complete eval   ? OT Frequency 1x / week   ? OT Duration 12 weeks   ? OT Treatment/Interventions Self-care/ADL training;Therapeutic exercise;Functional Mobility Training;Balance training;Splinting;Manual Therapy;Neuromuscular education;Aquatic Therapy;Ultrasound;Energy conservation;Therapeutic activities;Cryotherapy;Paraffin;DME and/or AE instruction;Cognitive remediation/compensation;Visual/perceptual remediation/compensation;Gait Training;Fluidtherapy;Electrical Stimulation;Moist Heat;Contrast Bath;Passive range of motion;Patient/family education   ? Plan d/c OT   ? Consulted and Agree with Plan of Care Patient   ? ?  ?  ? ?  ? ? ?Patient will benefit from skilled therapeutic intervention in order to improve the following deficits and impairments:   ?Body Structure / Function / Physical Skills: ADL, Balance, Endurance, Mobility, Strength, UE functional use, FMC, Vision, Coordination, Gait, Decreased knowledge of precautions, GMC, ROM, Decreased knowledge of use of DME, Dexterity, IADL, Sensation, Tone, Body mechanics ?Cognitive Skills: Attention, Memory, Problem Solve, Safety Awareness, Thought, Understand ?  ? ? ?Visit Diagnosis: ?Hemiplegia and hemiparesis following cerebral infarction affecting left non-dominant side  ([place]) ? ?Muscle weakness (generalized) ? ?Other symptoms and signs involving the nervous system ? ?Other abnormalities of gait and mobility ? ?Unsteadiness on feet ? ?Other disturbances of skin sensation ? ?Neurolo

## 2021-09-25 NOTE — Patient Instructions (Addendum)
PROM: Wrist Flexion / Extension ? ? ?Grasp  hand and slowly bend wrist until stretch is felt. Relax. Then stretch as far as possible in opposite direction. Be sure to keep elbow bent.  Hold __10__ sec. each way ?Repeat _5___ times per set.    Do _3_ sessions per day. ? ?Pronation (Passive) ? ? ?Keep elbow bent at right angle and held firmly to side. Use other hand to turn forearm until palm faces downward. Hold _10___ seconds. ?Repeat __10__ times. Do _3_ sessions per day. ? ?S ?

## 2021-09-28 ENCOUNTER — Other Ambulatory Visit: Payer: Self-pay | Admitting: Physical Medicine & Rehabilitation

## 2021-10-05 ENCOUNTER — Ambulatory Visit: Payer: Managed Care, Other (non HMO)

## 2021-10-05 DIAGNOSIS — M6281 Muscle weakness (generalized): Secondary | ICD-10-CM

## 2021-10-05 DIAGNOSIS — I69354 Hemiplegia and hemiparesis following cerebral infarction affecting left non-dominant side: Secondary | ICD-10-CM

## 2021-10-05 DIAGNOSIS — R2689 Other abnormalities of gait and mobility: Secondary | ICD-10-CM

## 2021-10-05 DIAGNOSIS — R2681 Unsteadiness on feet: Secondary | ICD-10-CM

## 2021-10-05 DIAGNOSIS — R29818 Other symptoms and signs involving the nervous system: Secondary | ICD-10-CM | POA: Diagnosis not present

## 2021-10-05 NOTE — Therapy (Signed)
?OUTPATIENT PHYSICAL THERAPY TREATMENT NOTE ? ? ?Patient Name: Dennis Mercado ?MRN: [id number] ?DOB:1980/06/21, 41 y.o., male ?Today's Date: 10/05/2021 ? ?PCP: Libby Maw, MD ?REFERRING PROVIDER: Libby Maw,* ? ? PT End of Session - 10/05/21 0801   ? ? Visit Number 9   ? Number of Visits 13   ? Date for PT Re-Evaluation 10/31/21   ? Authorization Type [employer] VL: 60 visits PT, 30 OT - Hard limit.   ? Authorization - Visit Number 8   ? Authorization - Number of Visits 60   ? PT Start Time 0802   ? PT Stop Time [phone]   ? PT Time Calculation (min) 40 min   ? Equipment Utilized During Treatment Gait belt   ? Activity Tolerance Patient tolerated treatment well   ? Behavior During Therapy [facility] for tasks assessed/performed   ? ?  ?  ? ?  ? ? ? ? ?No past medical history on file. ?Past Surgical History:  ?Procedure Laterality Date  ? WISDOM TOOTH EXTRACTION    ? ?Patient Active Problem List  ? Diagnosis Date Noted  ? Prediabetes 06/27/2021  ? Elevated glucose 10/13/2020  ? History of CVA (cerebrovascular accident) 01/19/2020  ? Normocytic anemia   ? Benign essential HTN   ? Hyponatremia   ? Acute blood loss anemia   ? Hypotension due to drugs   ? Vascular headache   ? Prerenal azotemia   ? Sleep disturbance   ? Leukocytosis   ? Transaminitis   ? Marijuana abuse   ? Essential hypertension   ? Dysphagia, post-stroke   ? Left hemiparesis ([place])   ? Acute respiratory failure ([place])   ? ICH (intracerebral hemorrhage) ([place]) 12/06/2019  ? ? ?REFERRING DIAG: R thalamic hemorrhage w/ L hemiparesis  ? ?THERAPY DIAG:  ?Hemiplegia and hemiparesis following cerebral infarction affecting left non-dominant side ([place]) ? ?Muscle weakness (generalized) ? ?Unsteadiness on feet ? ?Other abnormalities of gait and mobility ? ?PERTINENT HISTORY: No pertinent past medical history.  ? ?PRECAUTIONS: Fall  ? ?SUBJECTIVE: No new changes. Planning some trips to go out of town. Would like to continue PT services for enough.  ? ?PAIN:   ?Are you having pain? No ? ? ?TODAY'S TREATMENT:  ? ?STRENGTHENING: ?Scifit level using BLE's only level 4.0 x 7 minutes for strengthening, ROM and activity tolerance.  ? ? ?GAIT: ?Gait pattern: step through pattern, decreased arm swing- Left, decreased step length- Right, decreased stance time- Left, decreased stride length, decreased hip/knee flexion- Left, and decreased trunk rotation ?Distance walked: 100 ft outdoors, and clinic distance with activities ?Assistive device utilized: Single point cane and None ?Level of assistance: SBA and CGA ?Comments: completed gait without AD indoors throughout session; use of SPC outdoors ?Gait Speed: 18.03 secs without AD (1.82 ft/sec); 17.09 seconds with SPC (1.92 ft/sec) ? ?RAMP:  ?Level of Assistance: SBA  ?Assistive device utilized: None ?Ramp Comments: Completed on ramp inside therapy gym performed x 4 reps ascending/descending with SBA. Pt with more slowed pace and more cautious when descending noted. Will continue to work on confidence with ramp negotation ? ? ?CURB:  ?Level of Assistance: SBA (outdoors); CGA indoors ?Assistive device utilized: Single point cane and None ?Curb Comments: Completed curb indoors with use of SPC x 2 reps then without SPC x 2 reps, continue to require CGA without SPC indoors due to height of curb. Then ambulated outdoors to trial curb, patient able to complete x 3 reps without AD and SBA.  ? ? ?  STAIRS: ? Level of Assistance: SBA and CGA ? Stair Negotiation Technique: Step to Pattern ?Sideways ?Backwards with Single Rail on Right ? Number of Stairs: 12  ? Height of Stairs: 6  ?Comments: Practiced stair negotiation sideways and backwards due to visiting beach house and only having rails on R side. Increased challenge going down sideways due to width of stairs and LLE foot placement. Transitioned to going down stairs backwards with improvements in stability noted. Still require CGA with descent, will continue to practice to improve confidence  to allow for improved entry/exit into beach house.  ? ? ? ? ?HOME EXERCISE PROGRAM: ?Access code [email or web address] ? ? ? PT Long Term Goals  ? ?  ? PT LONG TERM GOAL #1  ? Title Pt will be independent with final HEP for stretching, ROM, and balance. ALL LTGS DUE 09/27/21   ? Time 8   ? Period Weeks   ? Baseline Reports independence with current HEP  ? Status MET  ? Target Date 09/27/21   ?  ? PT LONG TERM GOAL #2  ? Title Pt will perform a curb outdoors with no AD with supervision for improved community access.   ? Time 8  ? Baseline Curb without AD and supervision outdoors; CGA with indoors  ? Period Weeks   ? Status Met  ?  ? PT LONG TERM GOAL #3  ? Title Pt will ambulate up/down inclines with supervision in order to demo improved gait with going in and out to his mailbox with no AD.   ? Time 8   ? Baseline Supervision indoors; did not assess outdoors  ? Period Weeks   ? Status Partially Met  ?  ? PT LONG TERM GOAL #4  ? Title Pt will improve gait speed to at least 1.9 ft/sec with no AD in order to demo decr fall risk.   ? Baseline 1.74 ft/sec; 18.03 secs without AD (1.82 ft/sec); 17.09 seconds with SPC (1.92 ft/sec)  ? Time 8   ? Period Weeks   ? Status Partially Met  ?  ? PT LONG TERM GOAL #5  ? Title Pt will improve condition 4 of mCTSIB to at least 12 seconds in order to demo improved vestibular input for balance.   ? Baseline condition 4= 3 seconds (feet slightly apart)   ? Time 8   ? Period Weeks   ? Status On-Going  ? ?  ?  ? ?  ? ? ?Updated Long Term Goals:  ? ?PT Long Term Goals  ? ?  ? PT LONG TERM GOAL #1  ? Title Pt will be independent with final HEP for stretching, ROM, and balance. ALL LTGS DUE 10/31/21   ? Time 4   ? Period Weeks   ? Baseline Reports independence with current HEP; will benefit from progressive HEP  ? Status On-Going  ? Target Date 10/31/21   ?  ? PT LONG TERM GOAL #2  ? Title Pt will perform stairs backwards x 12 stairs with single hand rail on R and supervision to allow for improved entry  into beach home.  ? Time 4  ? Baseline CGA with backwards negotiation   ? Period Weeks   ? Status New  ?  ? PT LONG TERM GOAL #3  ? Title Pt will ambulate up/down outdoor grass incline with supervision in order to demo improved gait with going in and out to his mailbox with no AD.   ? Time 4  ?  Baseline Supervision indoors; did not assess outdoors  ? Period Weeks   ? Status Revised  ?  ? PT LONG TERM GOAL #4  ? Title Pt will improve gait speed to at least 1.9 ft/sec with no AD in order to demo decr fall risk.   ? Baseline 1.74 ft/sec; 18.03 secs without AD (1.82 ft/sec); 17.09 seconds with SPC (1.92 ft/sec)  ? Time 4  ? Period Weeks   ? Status On-Going  ?  ? PT LONG TERM GOAL #5  ? Title Pt will improve condition 4 of mCTSIB to at least 12 seconds in order to demo improved vestibular input for balance.   ? Baseline condition 4= 3 seconds (feet slightly apart)   ? Time 4  ? Period Weeks   ? Status On-Going  ? ?  ? ? ? ? Plan -   ? ? Clinical Impression Statement Completed assessment of patient's progress toward LTGs. Patient demonstrating improved ability to negotiation curb and ramp, still challenge with ramp descent especially on complaint surfaces. Patient improved gait speed without AD to 1.82 ft/sec. Patient would like to continue with PT services for additional 4 weeks, LTGs date updated and pushed out.   ? Personal Factors and Comorbidities Comorbidity 1;Past/Current Experience;Profession   ? Comorbidities HTN, right thalamic hemorrhage 12/06/2019   ? Examination-Activity Limitations Stand;Stairs;Squat;Transfers;Locomotion Level   ? Examination-Participation Restrictions Community Activity   walking his dog  ? Stability/Clinical Decision Making Stable/Uncomplicated   ? Rehab Potential Good   ? PT Frequency 1x / week   ? PT Duration 12 weeks   ? PT Treatment/Interventions ADLs/Self Care Home Management;Aquatic Therapy;Electrical Stimulation;DME Instruction;Gait training;Stair training;Functional mobility  training;Therapeutic activities;Therapeutic exercise;Balance training;Neuromuscular re-education;Wheelchair mobility training;Orthotic Fit/Training;Patient/family education;Passive range of motion;Energy conserva

## 2021-10-09 ENCOUNTER — Ambulatory Visit: Payer: Managed Care, Other (non HMO)

## 2021-10-09 DIAGNOSIS — I69354 Hemiplegia and hemiparesis following cerebral infarction affecting left non-dominant side: Secondary | ICD-10-CM

## 2021-10-09 DIAGNOSIS — R29818 Other symptoms and signs involving the nervous system: Secondary | ICD-10-CM | POA: Diagnosis not present

## 2021-10-09 DIAGNOSIS — R2681 Unsteadiness on feet: Secondary | ICD-10-CM

## 2021-10-09 DIAGNOSIS — R2689 Other abnormalities of gait and mobility: Secondary | ICD-10-CM

## 2021-10-09 DIAGNOSIS — M6281 Muscle weakness (generalized): Secondary | ICD-10-CM

## 2021-10-09 NOTE — Therapy (Signed)
?OUTPATIENT PHYSICAL THERAPY TREATMENT NOTE ? ? ?Patient Name: Dennis Mercado ?MRN: [id number] ?DOB:07/10/1980, 41 y.o., male ?Today's Date: 10/09/2021 ? ?PCP: Libby Maw, MD ?REFERRING PROVIDER: Libby Maw,* ? ? PT End of Session - 10/09/21 0801   ? ? Visit Number 10   ? Number of Visits 13   ? Date for PT Re-Evaluation 10/31/21   ? Authorization Type [employer] VL: 60 visits PT, 30 OT - Hard limit.   ? Authorization - Visit Number 9   ? Authorization - Number of Visits 60   ? PT Start Time 0801   ? PT Stop Time 0844   ? PT Time Calculation (min) 43 min   ? Equipment Utilized During Treatment Gait belt   ? Activity Tolerance Patient tolerated treatment well   ? Behavior During Therapy [facility] for tasks assessed/performed   ? ?  ?  ? ?  ? ? ?History reviewed. No pertinent past medical history. ?Past Surgical History:  ?Procedure Laterality Date  ? WISDOM TOOTH EXTRACTION    ? ?Patient Active Problem List  ? Diagnosis Date Noted  ? Prediabetes 06/27/2021  ? Elevated glucose 10/13/2020  ? History of CVA (cerebrovascular accident) 01/19/2020  ? Normocytic anemia   ? Benign essential HTN   ? Hyponatremia   ? Acute blood loss anemia   ? Hypotension due to drugs   ? Vascular headache   ? Prerenal azotemia   ? Sleep disturbance   ? Leukocytosis   ? Transaminitis   ? Marijuana abuse   ? Essential hypertension   ? Dysphagia, post-stroke   ? Left hemiparesis ([place])   ? Acute respiratory failure ([place])   ? ICH (intracerebral hemorrhage) ([place]) 12/06/2019  ? ? ?REFERRING DIAG: R thalamic hemorrhage w/ L hemiparesis  ? ?THERAPY DIAG:  ?Hemiplegia and hemiparesis following cerebral infarction affecting left non-dominant side ([place]) ? ?Muscle weakness (generalized) ? ?Unsteadiness on feet ? ?Other abnormalities of gait and mobility ? ?PERTINENT HISTORY: No pertinent past medical history.  ? ?PRECAUTIONS: Fall  ? ?SUBJECTIVE: Patient reports no new changes/complaints. Reports he start RV training today and tomorrow.   ? ?PAIN:  ?Are you having pain? No ? ? ?TODAY'S TREATMENT:  ? ?STRENGTHENING: ?Scifit level using BLE's only level 5.0 x 7 minutes for strengthening, ROM and activity tolerance. Patient tolerating increased resistance well.  ? ? ?GAIT: ?Gait pattern: step through pattern, decreased arm swing- Left, decreased step length- Right, decreased stance time- Left, decreased stride length, decreased hip/knee flexion- Left, and decreased trunk rotation ?Distance walked: clinic distance with activities; 300 ft outdoors ?Assistive device utilized: Single point cane and None ?Level of assistance: SBA and CGA ?Comments: completed gait without AD indoors throughout session. Simulated ambulating into/out of session from car without AD to allow for improved entry into session without AD.  ? ?RAMP:  ?Level of Assistance: SBA  ?Assistive device utilized: None ?Ramp Comments: on ramp inside therapy gym on complaint surface (blue mat), completed x 4 reps ascending/descending with SBA. Pt with more slowed pace when descending noted. No instances of imbalance noted. Will plan to trial on outdoors.  ? ? ?STAIRS: ? Level of Assistance: SBA and CGA ? Stair Negotiation Technique: Step to Pattern ?Sideways ?Backwards with Single Rail on Right ? Number of Stairs: 12 ? Height of Stairs: 6  ?Comments: Continued mass practice of stair negotiation backwards due to visiting beach house and only having rails on R side. Completed x 12 stairs.. Patient did better with descending leading with  RLE.  ? ? ?NMR:  ?Standing upward on incline with staggered stance, completed EC 2 x 30 seconds alternating foot position. Then with narrow BOS and eyes open, completed horizontal/vertical head turns x 10 reps each direction. More challenge noted with vertical > horizontal.  ? ? ?HOME EXERCISE PROGRAM: ?Access code [email or web address] ? ? ?Updated Long Term Goals:  ? ?PT Long Term Goals  ? ?  ? PT LONG TERM GOAL #1  ? Title Pt will be independent with final HEP for  stretching, ROM, and balance. ALL LTGS DUE 10/31/21   ? Time 4   ? Period Weeks   ? Baseline Reports independence with current HEP; will benefit from progressive HEP  ? Status On-Going  ? Target Date 10/31/21   ?  ? PT LONG TERM GOAL #2  ? Title Pt will perform stairs backwards x 12 stairs with single hand rail on R and supervision to allow for improved entry into beach home.  ? Time 4  ? Baseline CGA with backwards negotiation   ? Period Weeks   ? Status New  ?  ? PT LONG TERM GOAL #3  ? Title Pt will ambulate up/down outdoor grass incline with supervision in order to demo improved gait with going in and out to his mailbox with no AD.   ? Time 4  ? Baseline Supervision indoors; did not assess outdoors  ? Period Weeks   ? Status Revised  ?  ? PT LONG TERM GOAL #4  ? Title Pt will improve gait speed to at least 1.9 ft/sec with no AD in order to demo decr fall risk.   ? Baseline 1.74 ft/sec; 18.03 secs without AD (1.82 ft/sec); 17.09 seconds with SPC (1.92 ft/sec)  ? Time 4  ? Period Weeks   ? Status On-Going  ?  ? PT LONG TERM GOAL #5  ? Title Pt will improve condition 4 of mCTSIB to at least 12 seconds in order to demo improved vestibular input for balance.   ? Baseline condition 4= 3 seconds (feet slightly apart)   ? Time 4  ? Period Weeks   ? Status On-Going  ? ?  ? ? ? ? Plan -   ? ? Clinical Impression Statement Continued training with stairs, working on backwards descent to simulate patient completing at [employer]. Also trialed ambulating into/out of session without AD with patient tolerating well. Encouraged not to use AD ambulating into session next time. Will continue per POC.   ? Personal Factors and Comorbidities Comorbidity 1;Past/Current Experience;Profession   ? Comorbidities HTN, right thalamic hemorrhage 12/06/2019   ? Examination-Activity Limitations Stand;Stairs;Squat;Transfers;Locomotion Level   ? Examination-Participation Restrictions Community Activity   walking his dog  ? Stability/Clinical  Decision Making Stable/Uncomplicated   ? Rehab Potential Good   ? PT Frequency 1x / week   ? PT Duration 12 weeks   ? PT Treatment/Interventions ADLs/Self Care Home Management;Aquatic Therapy;Electrical Stimulation;DME Instruction;Gait training;Stair training;Functional mobility training;Therapeutic activities;Therapeutic exercise;Balance training;Neuromuscular re-education;Wheelchair mobility training;Orthotic Fit/Training;Patient/family education;Passive range of motion;Energy conservation;Vestibular   ? PT Next Visit Plan Continue to work on gait with no AD, stepping over obstacles, going up/down curbs, gait up/down inclines (try going outside??). Balance on unlevel surfaces, SLS tasks with LLE.   ? PT Home Exercise Plan [id number]   ? Consulted and Agree with Plan of Care Patient   ? ?  ?  ? ?  ? ? ?Jones Bales, PT, DPT ?10/09/21 8:47 AM  ? ? ? ?   ?

## 2021-10-15 ENCOUNTER — Ambulatory Visit: Payer: Managed Care, Other (non HMO) | Attending: Family Medicine

## 2021-10-15 DIAGNOSIS — I69354 Hemiplegia and hemiparesis following cerebral infarction affecting left non-dominant side: Secondary | ICD-10-CM | POA: Diagnosis present

## 2021-10-15 DIAGNOSIS — R2681 Unsteadiness on feet: Secondary | ICD-10-CM | POA: Insufficient documentation

## 2021-10-15 DIAGNOSIS — M6281 Muscle weakness (generalized): Secondary | ICD-10-CM | POA: Insufficient documentation

## 2021-10-15 DIAGNOSIS — R2689 Other abnormalities of gait and mobility: Secondary | ICD-10-CM | POA: Insufficient documentation

## 2021-10-15 NOTE — Therapy (Signed)
?OUTPATIENT PHYSICAL THERAPY TREATMENT NOTE ? ? ?Patient Name: Dennis Mercado ?MRN: [id number] ?DOB:1980/10/14, 41 y.o., male ?Today's Date: 10/15/2021 ? ?PCP: Libby Maw, MD ?REFERRING PROVIDER: Libby Maw,* ? ? PT End of Session - 10/15/21 0801   ? ? Visit Number 11   ? Number of Visits 13   ? Date for PT Re-Evaluation 10/31/21   ? Authorization Type [employer] VL: 60 visits PT, 30 OT - Hard limit.   ? Authorization - Visit Number 10   ? Authorization - Number of Visits 60   ? PT Start Time 0801   ? PT Stop Time 0844   ? PT Time Calculation (min) 43 min   ? Equipment Utilized During Treatment Gait belt   ? Activity Tolerance Patient tolerated treatment well   ? Behavior During Therapy [facility] for tasks assessed/performed   ? ?  ?  ? ?  ? ? ? ?History reviewed. No pertinent past medical history. ?Past Surgical History:  ?Procedure Laterality Date  ? WISDOM TOOTH EXTRACTION    ? ?Patient Active Problem List  ? Diagnosis Date Noted  ? Prediabetes 06/27/2021  ? Elevated glucose 10/13/2020  ? History of CVA (cerebrovascular accident) 01/19/2020  ? Normocytic anemia   ? Benign essential HTN   ? Hyponatremia   ? Acute blood loss anemia   ? Hypotension due to drugs   ? Vascular headache   ? Prerenal azotemia   ? Sleep disturbance   ? Leukocytosis   ? Transaminitis   ? Marijuana abuse   ? Essential hypertension   ? Dysphagia, post-stroke   ? Left hemiparesis ([place])   ? Acute respiratory failure ([place])   ? ICH (intracerebral hemorrhage) ([place]) 12/06/2019  ? ? ?REFERRING DIAG: R thalamic hemorrhage w/ L hemiparesis  ? ?THERAPY DIAG:  ?Hemiplegia and hemiparesis following cerebral infarction affecting left non-dominant side ([place]) ? ?Muscle weakness (generalized) ? ?Unsteadiness on feet ? ?Other abnormalities of gait and mobility ? ?PERTINENT HISTORY: No pertinent past medical history.  ? ?PRECAUTIONS: Fall  ? ?SUBJECTIVE: Patient reports no new changes/complaints since last visit. Walked in without the cane and it  went well. ? ?PAIN:  ?Are you having pain? No ? ? ?TODAY'S TREATMENT:  ? ?STRENGTHENING: ?Scifit level using BLE's only level 4.0 x 8 minutes for strengthening, ROM and activity tolerance. Continue to tolerate increase resistance well, and able to maintain steps per minute >/= 80.  ? ? ?GAIT: ?Gait pattern: step through pattern, decreased arm swing- Left, decreased step length- Right, decreased stance time- Left, decreased stride length, decreased hip/knee flexion- Left, and decreased trunk rotation ?Distance walked: ambulation into/out of session without AD ?Assistive device utilized: None ?Level of assistance: SBA and CGA ?Comment: walking without AD into session and no use of AD throughout session. Continued ambulation with high level balance activities, see below.  ? ?NMR:  ?Completed ambulation indoors across blue mat with pebbles placed under in various locations to simulate unlevel outdoor surfaces to further challenge balance. Completed with CGA, completed x 4 laps down and back with intermittent cues for step placement. Increased challenge with stance on LLE > RLE.  2-3 instances of catching L Toe on mat but able to maintain balance with CGA.  ? ?Completed obstacle Course on Blue/Red Mat, including obstacle negotiation with stepping over black balance beams and tall orange hurdle, followed by working on negotiating around cones on level floor. Completed x 4 laps down and back. With stepping over objects, patient demo decreased clearance when  LLE trailing, therefore educated to lead with LLE and improvements noted with clearance. Patient demo increased challenge with tall hurdle often wanting to circumduct to clear obstacle. With turns around cones, cues required to maintain step length and widen turn to increased smoothness of transition.   ? ?Standing upward on incline on blue mat with staggered stance, completed EC 2 x 30 seconds alternating foot position. Increased challenge with LLE posterior. Then with  feet hip width on blue, completed alternating toe tap to pebbles x 10 reps bilat. Cues for increased SLS on LLE.  ? ? ?HOME EXERCISE PROGRAM: ?Access code [id number] ? ? ? ?PT Long Term Goals  ? ?  ? PT LONG TERM GOAL #1  ? Title Pt will be independent with final HEP for stretching, ROM, and balance. ALL LTGS DUE 10/31/21   ? Time 4   ? Period Weeks   ? Baseline Reports independence with current HEP; will benefit from progressive HEP  ? Status On-Going  ? Target Date 10/31/21   ?  ? PT LONG TERM GOAL #2  ? Title Pt will perform stairs backwards x 12 stairs with single hand rail on R and supervision to allow for improved entry into beach home.  ? Time 4  ? Baseline CGA with backwards negotiation   ? Period Weeks   ? Status New  ?  ? PT LONG TERM GOAL #3  ? Title Pt will ambulate up/down outdoor grass incline with supervision in order to demo improved gait with going in and out to his mailbox with no AD.   ? Time 4  ? Baseline Supervision indoors; did not assess outdoors  ? Period Weeks   ? Status Revised  ?  ? PT LONG TERM GOAL #4  ? Title Pt will improve gait speed to at least 1.9 ft/sec with no AD in order to demo decr fall risk.   ? Baseline 1.74 ft/sec; 18.03 secs without AD (1.82 ft/sec); 17.09 seconds with SPC (1.92 ft/sec)  ? Time 4  ? Period Weeks   ? Status On-Going  ?  ? PT LONG TERM GOAL #5  ? Title Pt will improve condition 4 of mCTSIB to at least 12 seconds in order to demo improved vestibular input for balance.   ? Baseline condition 4= 3 seconds (feet slightly apart)   ? Time 4  ? Period Weeks   ? Status On-Going  ? ?  ? ? ? ? Plan -   ? ? Clinical Impression Statement Continued session focused on balance on unlevel surfaces. Unable to due ramp outdoors due to rain over the weekend and ground being saturated. Simulated unlevel surfaces indoors with patient tolerating activities well today. Still most challenge noted with SLS on LLE.   ? Personal Factors and Comorbidities Comorbidity 1;Past/Current  Experience;Profession   ? Comorbidities HTN, right thalamic hemorrhage 12/06/2019   ? Examination-Activity Limitations Stand;Stairs;Squat;Transfers;Locomotion Level   ? Examination-Participation Restrictions Community Activity   walking his dog  ? Stability/Clinical Decision Making Stable/Uncomplicated   ? Rehab Potential Good   ? PT Frequency 1x / week   ? PT Duration 12 weeks   ? PT Treatment/Interventions ADLs/Self Care Home Management;Aquatic Therapy;Electrical Stimulation;DME Instruction;Gait training;Stair training;Functional mobility training;Therapeutic activities;Therapeutic exercise;Balance training;Neuromuscular re-education;Wheelchair mobility training;Orthotic Fit/Training;Patient/family education;Passive range of motion;Energy conservation;Vestibular   ? PT Next Visit Plan Continue to work on gait with no AD, stepping over obstacles, going up/down curbs, gait up/down inclines (try going outside??). Balance on unlevel surfaces, SLS tasks with  LLE.   ? PT Home Exercise Plan [id number]   ? Consulted and Agree with Plan of Care Patient   ? ?  ?  ? ?  ? ? ?Jones Bales, PT, DPT ?10/15/21 9:50 AM  ? ? ? ?   ?

## 2021-10-22 ENCOUNTER — Encounter: Payer: Self-pay | Admitting: Physical Therapy

## 2021-10-22 ENCOUNTER — Ambulatory Visit: Payer: Managed Care, Other (non HMO) | Admitting: Physical Therapy

## 2021-10-22 DIAGNOSIS — R2681 Unsteadiness on feet: Secondary | ICD-10-CM

## 2021-10-22 DIAGNOSIS — M6281 Muscle weakness (generalized): Secondary | ICD-10-CM

## 2021-10-22 DIAGNOSIS — R2689 Other abnormalities of gait and mobility: Secondary | ICD-10-CM

## 2021-10-22 DIAGNOSIS — I69354 Hemiplegia and hemiparesis following cerebral infarction affecting left non-dominant side: Secondary | ICD-10-CM | POA: Diagnosis not present

## 2021-10-22 NOTE — Therapy (Signed)
?OUTPATIENT PHYSICAL THERAPY TREATMENT NOTE ? ? ?Patient Name: Dennis Mercado ?MRN: [id number] ?DOB:1980/11/10, 41 y.o., male ?Today's Date: 10/22/2021 ? ?PCP: Libby Maw, MD ?REFERRING PROVIDER: Libby Maw,* ? ? PT End of Session - 10/22/21 0803   ? ? Visit Number 12   ? Number of Visits 13   ? Date for PT Re-Evaluation 10/31/21   ? Authorization Type [employer] VL: 60 visits PT, 30 OT - Hard limit.   ? Authorization - Visit Number 11   ? Authorization - Number of Visits 60   ? PT Start Time 0801   ? PT Stop Time [phone]   ? PT Time Calculation (min) 42 min   ? Equipment Utilized During Treatment Gait belt   ? Activity Tolerance Patient tolerated treatment well   ? Behavior During Therapy [facility] for tasks assessed/performed   ? ?  ?  ? ?  ? ? ? ? ?History reviewed. No pertinent past medical history. ?Past Surgical History:  ?Procedure Laterality Date  ? WISDOM TOOTH EXTRACTION    ? ?Patient Active Problem List  ? Diagnosis Date Noted  ? Prediabetes 06/27/2021  ? Elevated glucose 10/13/2020  ? History of CVA (cerebrovascular accident) 01/19/2020  ? Normocytic anemia   ? Benign essential HTN   ? Hyponatremia   ? Acute blood loss anemia   ? Hypotension due to drugs   ? Vascular headache   ? Prerenal azotemia   ? Sleep disturbance   ? Leukocytosis   ? Transaminitis   ? Marijuana abuse   ? Essential hypertension   ? Dysphagia, post-stroke   ? Left hemiparesis ([place])   ? Acute respiratory failure ([place])   ? ICH (intracerebral hemorrhage) ([place]) 12/06/2019  ? ? ?REFERRING DIAG: R thalamic hemorrhage w/ L hemiparesis  ? ?THERAPY DIAG:  ?Hemiplegia and hemiparesis following cerebral infarction affecting left non-dominant side ([place]) ? ?Muscle weakness (generalized) ? ?Unsteadiness on feet ? ?Other abnormalities of gait and mobility ? ?PERTINENT HISTORY: No pertinent past medical history.  ? ?PRECAUTIONS: Fall  ? ?SUBJECTIVE: Walked in again without the cane. Went outside with April in the grass and it was a little  tricky, but still went well.  ? ?PAIN:  ?Are you having pain? No ? ? ?TODAY'S TREATMENT:  ? ?STRENGTHENING: ?Scifit level using BLE's only level 4.0 x 8 minutes for strengthening, ROM and activity tolerance.  ? ?NMR:  ?With single UE support on chair, with SLS on LLE- forward tap and then cross body tap with RLE to cone x8 reps, cues for knee extension through LLE. Pt unable to perform without UE support.  ? ? ?GAIT: ?Gait pattern: step through pattern, decreased arm swing- Left, decreased step length- Right, decreased stance time- Left, decreased stride length, decreased hip/knee flexion- Left, and decreased trunk rotation ?Distance walked: in clinic distances, outdoors on grass surfaces approx. 300'   ?Assistive device utilized: None ?Level of assistance: SBA and CGA - CGA when outdoors with no AD.  ?Comment: walking without AD into session and no use of AD throughout session. Performed gait on grass outdoors with pt having a wider BOS and decr step length with RLE, pt able to maintain balance. Pt able to improve step length when cued. Pt's LLE fatigues more easily on grass.  ? ?RAMP:  ?Level of Assistance: SBA  ?Assistive device utilized: None ?Ramp Comments: on ramp inside therapy gym x2 reps  ?  ?  ?STAIRS: ?  Level of Assistance: SBA ?            Stair Negotiation Technique: Step to Pattern ?Backwards with Single Rail on Right ?            Number of Stairs: 16 (4 reps of 4 stairs) ?            Height of Stairs: 6        ?Comments: Continued mass practice of stair negotiation backwards due to visiting beach house at the end of May and only having rails on R side. Completed x 16 stairs. Patient did better with descending with RLE first and pt reporting it felt better as well. Able to perform with supervision today.  ? ?CURB:  ?Level of Assistance: CGA indoors  ?Assistive device utilized: No AD ?Curb Comments: Performed wit no AD, x4 reps. Pt with incr spasms after 2 reps due to fatigue/pt reporting  feeling more anxious.  ? ? ?HOME EXERCISE PROGRAM: ?Access code [email or web address] ? ? ? ?PT Long Term Goals  ? ?  ? PT LONG TERM GOAL #1  ? Title Pt will be independent with final HEP for stretching, ROM, and balance. ALL LTGS DUE 10/31/21   ? Time 4   ? Period Weeks   ? Baseline Reports independence with current HEP; will benefit from progressive HEP  ? Status On-Going  ? Target Date 10/31/21   ?  ? PT LONG TERM GOAL #2  ? Title Pt will perform stairs backwards x 12 stairs with single hand rail on R and supervision to allow for improved entry into beach home.  ? Time 4  ? Baseline CGA with backwards negotiation   ? Period Weeks   ? Status New  ?  ? PT LONG TERM GOAL #3  ? Title Pt will ambulate up/down outdoor grass incline with supervision in order to demo improved gait with going in and out to his mailbox with no AD.   ? Time 4  ? Baseline Supervision indoors; did not assess outdoors  ? Period Weeks   ? Status Revised  ?  ? PT LONG TERM GOAL #4  ? Title Pt will improve gait speed to at least 1.9 ft/sec with no AD in order to demo decr fall risk.   ? Baseline 1.74 ft/sec; 18.03 secs without AD (1.82 ft/sec); 17.09 seconds with SPC (1.92 ft/sec)  ? Time 4  ? Period Weeks   ? Status On-Going  ?  ? PT LONG TERM GOAL #5  ? Title Pt will improve condition 4 of mCTSIB to at least 12 seconds in order to demo improved vestibular input for balance.   ? Baseline condition 4= 3 seconds (feet slightly apart)   ? Time 4  ? Period Weeks   ? Status On-Going  ? ?  ? ? ? ? Plan -   ? ? Clinical Impression Statement Continued session focused on stairs, ramps, curbs, and gait outdoors on grass. Pt able to ambulate on grass with CGA, and pt needing to slow down speed and walk with a wider BOS for balance, but pt did not demonstrate any episodes of LOB. Pt has been doing more ambulation at home and on unlevel surfaces with no AD with his wife and is feeling better about it. Will continue to progress towards LTGs.  ?  ? Personal Factors and  Comorbidities Comorbidity 1;Past/Current Experience;Profession   ? Comorbidities HTN, right thalamic hemorrhage 12/06/2019   ? Examination-Activity Limitations Stand;Stairs;Squat;Transfers;Locomotion Level   ?  Examination-Participation Restrictions Community Activity   walking his dog  ? Stability/Clinical Decision Making Stable/Uncomplicated   ? Rehab Potential Good   ? PT Frequency 1x / week   ? PT Duration 12 weeks   ? PT Treatment/Interventions ADLs/Self Care Home Management;Aquatic Therapy;Electrical Stimulation;DME Instruction;Gait training;Stair training;Functional mobility training;Therapeutic activities;Therapeutic exercise;Balance training;Neuromuscular re-education;Wheelchair mobility training;Orthotic Fit/Training;Patient/family education;Passive range of motion;Energy conservation;Vestibular   ? PT Next Visit Plan Goals will be due next week. Continue to work on gait with no AD, stepping over obstacles, going up/down curbs, gait up/down inclines (try going outside??). Balance on unlevel surfaces, SLS tasks with LLE.   ? PT Home Exercise Plan [email or web address]   ? Consulted and Agree with Plan of Care Patient   ? ?  ?  ? ?  ? ? ?Janann August, PT, DPT ?10/22/21 8:43 AM  ? ? ? ? ?   ?

## 2021-10-29 ENCOUNTER — Ambulatory Visit: Payer: Managed Care, Other (non HMO) | Admitting: Physical Therapy

## 2021-10-29 ENCOUNTER — Encounter: Payer: Self-pay | Admitting: Physical Therapy

## 2021-10-29 DIAGNOSIS — I69354 Hemiplegia and hemiparesis following cerebral infarction affecting left non-dominant side: Secondary | ICD-10-CM | POA: Diagnosis not present

## 2021-10-29 DIAGNOSIS — R2689 Other abnormalities of gait and mobility: Secondary | ICD-10-CM

## 2021-10-29 DIAGNOSIS — M6281 Muscle weakness (generalized): Secondary | ICD-10-CM

## 2021-10-29 DIAGNOSIS — R2681 Unsteadiness on feet: Secondary | ICD-10-CM

## 2021-10-29 NOTE — Therapy (Signed)
?OUTPATIENT PHYSICAL THERAPY TREATMENT NOTE ? ? ?Patient Name: Dennis Mercado ?MRN: [id number] ?DOB:1980/06/21, 41 y.o., male ?Today's Date: 10/29/2021 ? ?PCP: Libby Maw, MD ?REFERRING PROVIDER: Libby Maw,* ? ? PT End of Session - 10/29/21 [phone]   ? ? Visit Number 13   ? Number of Visits 13   ? Date for PT Re-Evaluation 10/31/21   ? Authorization Type [employer] VL: 60 visits PT, 30 OT - Hard limit.   ? Authorization - Visit Number 12   ? Authorization - Number of Visits 60   ? PT Start Time [phone]   ? PT Stop Time 0845   ? PT Time Calculation (min) 41 min   ? Equipment Utilized During Treatment Gait belt   ? Activity Tolerance Patient tolerated treatment well   ? Behavior During Therapy [facility] for tasks assessed/performed   ? ?  ?  ? ?  ? ? ? ? ?History reviewed. No pertinent past medical history. ?Past Surgical History:  ?Procedure Laterality Date  ? WISDOM TOOTH EXTRACTION    ? ?Patient Active Problem List  ? Diagnosis Date Noted  ? Prediabetes 06/27/2021  ? Elevated glucose 10/13/2020  ? History of CVA (cerebrovascular accident) 01/19/2020  ? Normocytic anemia   ? Benign essential HTN   ? Hyponatremia   ? Acute blood loss anemia   ? Hypotension due to drugs   ? Vascular headache   ? Prerenal azotemia   ? Sleep disturbance   ? Leukocytosis   ? Transaminitis   ? Marijuana abuse   ? Essential hypertension   ? Dysphagia, post-stroke   ? Left hemiparesis ([place])   ? Acute respiratory failure ([place])   ? ICH (intracerebral hemorrhage) ([place]) 12/06/2019  ? ? ?REFERRING DIAG: R thalamic hemorrhage w/ L hemiparesis  ? ?THERAPY DIAG:  ?Hemiplegia and hemiparesis following cerebral infarction affecting left non-dominant side ([place]) ? ?Unsteadiness on feet ? ?Muscle weakness (generalized) ? ?Other abnormalities of gait and mobility ? ?PERTINENT HISTORY: No pertinent past medical history.  ? ?PRECAUTIONS: Fall  ? ?SUBJECTIVE: Went to his aunt's house over the weekend - reports that she had a steep driveway and it went  well.  ? ?PAIN:  ?Are you having pain? No ? ? ?TODAY'S TREATMENT:  ? ?NMR: ? ?With LLE as stance leg, stepping RLE over forwards and back over 2" obstacle x15 reps beginning with UE support > none, pt needing min guard and decr stance time on LLE initially and at one point LLE came off the floor when stepping RLE back to midline but improved with incr stance time on LLE with incr reps. Initial difficulty with shifting weight back to LLE to step RLE back to midline. Then lateral stepping with RLE over 4" obstacle for incr stance time on LLE x15 reps, min guard as needed for balance due to initial decr stance time on LLE and pt hurriedly stepping RLE back to midline. Cues to make steps "soft" for more controlled SLS.  ?With 4 smaller orange obstacles, performing with step to pattern down and back x3 reps in // bars with first leading with LLE to step over. Cued for incr hip/knee flexion with LLE to clear leg vs performing with circumduction. Then performed down and back x2 reps with RLE stepping over first with pt getting LLE caught on obstacles at times with therapist needing to reposition obstacles. Cued for incr hip/knee flexion on LLE with pt able to perform this way when he would incr trunk lean to the  R. UE support as needed.  ? ? ?Therapeutic Activity:  ?Pt asking about getting in and out of the shower without use of an AD and brought in photo of shower set up at home. There is a raised edge ~4" and pt currently has a shower chair that he can slide in and out of and a grab bar once he is in the shower. Discussed safety when getting in and out of the shower without an AD. Pt does not have any grab bars that he can hold on to while getting in and out to be able to clear the obstacle safely and would not want pt to have a fall in the shower when trying to step over and hit his head. Pt to check if he can install grab bars, otherwise pt verbalized understanding of being able to scoot in and out on shower chair is  the safest option.  ? ?GAIT: ?Gait pattern: step through pattern, decreased arm swing- Left, decreased step length- Right, decreased stance time- Left, decreased stride length, decreased hip/knee flexion- Left, and decreased trunk rotation ?Distance walked: in clinic distances ?Assistive device utilized: None ?Level of assistance: SBA  ? ? ?PATIENT EDUCATION: ?Education details: See therapeutic activity section above. ?Person educated: Patient ?Education method: Explanation ?Education comprehension: verbalized understanding and returned demonstration ? ? ?HOME EXERCISE PROGRAM: ?Access code [email or web address] ? ? ? ?PT Long Term Goals  ? ?  ? PT LONG TERM GOAL #1  ? Title Pt will be independent with final HEP for stretching, ROM, and balance. ALL LTGS DUE 10/31/21   ? Time 4   ? Period Weeks   ? Baseline Reports independence with current HEP; will benefit from progressive HEP  ? Status On-Going  ? Target Date 10/31/21   ?  ? PT LONG TERM GOAL #2  ? Title Pt will perform stairs backwards x 12 stairs with single hand rail on R and supervision to allow for improved entry into beach home.  ? Time 4  ? Baseline CGA with backwards negotiation   ? Period Weeks   ? Status New  ?  ? PT LONG TERM GOAL #3  ? Title Pt will ambulate up/down outdoor grass incline with supervision in order to demo improved gait with going in and out to his mailbox with no AD.   ? Time 4  ? Baseline Supervision indoors; did not assess outdoors  ? Period Weeks   ? Status Revised  ?  ? PT LONG TERM GOAL #4  ? Title Pt will improve gait speed to at least 1.9 ft/sec with no AD in order to demo decr fall risk.   ? Baseline 1.74 ft/sec; 18.03 secs without AD (1.82 ft/sec); 17.09 seconds with SPC (1.92 ft/sec)  ? Time 4  ? Period Weeks   ? Status On-Going  ?  ? PT LONG TERM GOAL #5  ? Title Pt will improve condition 4 of mCTSIB to at least 12 seconds in order to demo improved vestibular input for balance.   ? Baseline condition 4= 3 seconds (feet slightly apart)   ?  Time 4  ? Period Weeks   ? Status On-Going  ? ?  ? ? ? ? Plan -   ? ? Clinical Impression Statement Today's skilled session focused on obstacle negotiations and SLS tasks on LLE. Pt able to clear obstacles better when LLE was leading. Discussed safety with getting in and out of the shower -due to there being a raised surface to  step in/out, the safest thing will be to continue to scoot in and out then trying to step over obstacle without an AD and no grab bars. Pt verbalized understanding. Will continue to progress towards LTGs.  ?  ? Personal Factors and Comorbidities Comorbidity 1;Past/Current Experience;Profession   ? Comorbidities HTN, right thalamic hemorrhage 12/06/2019   ? Examination-Activity Limitations Stand;Stairs;Squat;Transfers;Locomotion Level   ? Examination-Participation Restrictions Community Activity   walking his dog  ? Stability/Clinical Decision Making Stable/Uncomplicated   ? Rehab Potential Good   ? PT Frequency 1x / week   ? PT Duration 12 weeks   ? PT Treatment/Interventions ADLs/Self Care Home Management;Aquatic Therapy;Electrical Stimulation;DME Instruction;Gait training;Stair training;Functional mobility training;Therapeutic activities;Therapeutic exercise;Balance training;Neuromuscular re-education;Wheelchair mobility training;Orthotic Fit/Training;Patient/family education;Passive range of motion;Energy conservation;Vestibular   ? PT Next Visit Plan Need to check goals, re-cert vs. D/C?  ? ? Continue to work on gait with no AD, stepping over obstacles, going up/down curbs, gait up/down inclines (try going outside??). Balance on unlevel surfaces, SLS tasks with LLE.   ? PT Home Exercise Plan [email or web address]   ? Consulted and Agree with Plan of Care Patient   ? ?  ?  ? ?  ? ? ?Janann August, PT, DPT ?10/29/21 10:27 AM  ? ? ? ? ?   ?

## 2021-11-01 ENCOUNTER — Other Ambulatory Visit: Payer: Self-pay | Admitting: Physical Medicine & Rehabilitation

## 2021-11-05 ENCOUNTER — Ambulatory Visit: Payer: Managed Care, Other (non HMO) | Admitting: Physical Therapy

## 2021-11-05 ENCOUNTER — Encounter: Payer: Self-pay | Admitting: Physical Therapy

## 2021-11-05 DIAGNOSIS — I69354 Hemiplegia and hemiparesis following cerebral infarction affecting left non-dominant side: Secondary | ICD-10-CM | POA: Diagnosis not present

## 2021-11-05 DIAGNOSIS — R2689 Other abnormalities of gait and mobility: Secondary | ICD-10-CM

## 2021-11-05 DIAGNOSIS — M6281 Muscle weakness (generalized): Secondary | ICD-10-CM

## 2021-11-05 DIAGNOSIS — R2681 Unsteadiness on feet: Secondary | ICD-10-CM

## 2021-11-05 NOTE — Therapy (Signed)
OUTPATIENT PHYSICAL THERAPY TREATMENT NOTE/RE-CERT   Patient Name: Dennis Mercado MRN: [id number] DOB:31-Mar-1981, 41 y.o., male Today's Date: 11/05/2021  PCP: Libby Maw, MD REFERRING PROVIDER: [name], MD   PT End of Session - 11/05/21 7124     Visit Number 14    Number of Visits 18    Date for PT Re-Evaluation 01/04/22   due to potential delay in scheduling   Authorization Type [employer] VL: 60 visits PT, 30 OT - Hard limit.    Authorization - Visit Number 13    Authorization - Number of Visits 60    PT Start Time [phone]   pt late to session   PT Stop Time 0845    PT Time Calculation (min) 38 min    Equipment Utilized During Treatment Gait belt    Activity Tolerance Patient tolerated treatment well    Behavior During Therapy WFL for tasks assessed/performed               History reviewed. No pertinent past medical history. Past Surgical History:  Procedure Laterality Date   WISDOM TOOTH EXTRACTION     Patient Active Problem List   Diagnosis Date Noted   Prediabetes 06/27/2021   Elevated glucose 10/13/2020   History of CVA (cerebrovascular accident) 01/19/2020   Normocytic anemia    Benign essential HTN    Hyponatremia    Acute blood loss anemia    Hypotension due to drugs    Vascular headache    Prerenal azotemia    Sleep disturbance    Leukocytosis    Transaminitis    Marijuana abuse    Essential hypertension    Dysphagia, post-stroke    Left hemiparesis ([facility])    Acute respiratory failure ([facility])    ICH (intracerebral hemorrhage) ([place]) 12/06/2019    REFERRING DIAG: R thalamic hemorrhage w/ L hemiparesis   THERAPY DIAG:  Hemiplegia and hemiparesis following cerebral infarction affecting left non-dominant side ([facility])  Unsteadiness on feet  Muscle weakness (generalized)  Other abnormalities of gait and mobility  PERTINENT HISTORY: No pertinent past medical history.   PRECAUTIONS: Fall   SUBJECTIVE: Brought in his cane today,  feeling more stiff. Walked up and down his driveway over the weekend without his cane. Been trying more curbs.   PAIN:  Are you having pain? No   TODAY'S TREATMENT:   STRENGTHENING: Scifit level using BLE's only level 5.0 x 6 minutes for strengthening, ROM and activity tolerance. Pt able to tolerate incr in resistance well.   Goal assesssment:  Gait speed = 18.1 seconds =1.81 ft/sec without AD  Condition 4 =30 seconds (2 reps), performed with feet together.   GAIT: Gait pattern: step through pattern, decreased arm swing- Left, decreased step length- Right, decreased stance time- Left, decreased stride length, decreased hip/knee flexion- Left, and decreased trunk rotation Distance walked: in clinic distances Assistive device utilized: None Level of assistance: SBA    STAIRS:             Level of Assistance: SBA             Stair Negotiation Technique: Step to Pattern Backwards with Single Rail on Right             Number of Stairs: 12 (3 reps of 4 stairs)             Height of Stairs: 6        Comments:  Completed x 12 stairs. Patient did better with descending with RLE  first and pt reporting it felt better as well. Able to perform with supervision today. Pt to go to his beach house to perform steps this way this weekend.    RAMP:  Level of Assistance: SBA Assistive device utilized: None Ramp Comments: Going up and down incline in therapy gym with blue mat as a compliant surface down and back x5 reps. Pt more cautious initially when descending, but able to incr step length with incr reps.  Then performed standing down incline with LLE as stance leg, stepping RLE forwards and back to midline 10 reps.       PATIENT EDUCATION: Education details: Adding 1x week for 4 weeks to continue to work on gait on outdoor surfaces with grass/pavement and inclines in the community with plan to D/C after. Progress towards goals.  Person educated: Patient Education method:  Explanation Education comprehension: verbalized understanding and returned demonstration   HOME EXERCISE PROGRAM: Access code [place] #1   Title Pt will be independent with final HEP for stretching, ROM, and balance. ALL LTGS DUE 10/31/21    Time 4    Period Weeks    Baseline Reports independence with current HEP; will benefit from progressive HEP   Status On-Going   Target Date 10/31/21      PT LONG TERM GOAL #2   Title Pt will perform stairs backwards x 12 stairs with single hand rail on R and supervision to allow for improved entry into beach home.   Time 4   Baseline Supervision on 11/05/21   Period Weeks    Status MET     PT LONG TERM GOAL #3   Title Pt will ambulate up/down outdoor grass incline with supervision in order to demo improved gait with going in and out to his mailbox with no AD.    Time 4   Baseline Supervision indoors on incline, not able to perform outdoors today due to time constraints.    Period Weeks    Status ON-GOING      PT LONG TERM GOAL #4   Title Pt will improve gait speed to at least 1.9 ft/sec with no AD in order to demo decr fall risk.    Baseline 18.03 secs without AD (1.82 ft/sec); 17.09 seconds with SPC (1.92 ft/sec)  On 11/05/21: 18.1 seconds without AD = 1.81 ft/sec   Time 4   Period Weeks    Status NOT MET     PT LONG TERM GOAL #5   Title Pt will improve condition 4 of mCTSIB to at least 12 seconds in order to demo improved vestibular input for balance.    Baseline condition 4= 3 seconds (feet slightly apart)   Condition 4= 30 seconds (feet together)   Time 4   Period Weeks    Status MET      ON-GOING/UPDATED LTGS FOR RE-CERT LONG TERM GOALS: ALL LTGS DUE 12/03/21  Pt will be independent with final HEP for stretching, ROM, and balance.  Baseline: Reports independence with current HEP; will benefit from progressive HEP Goal status: IN PROGRESS  2.  Pt will ambulate up/down  outdoor grass incline with supervision in order to demo improved gait with going in and out to his mailbox with no AD.  Baseline: Supervision indoors on incline, not able to perform outdoors today due to time constraints.   Goal status: IN PROGRESS  3.  Pt will ambulate at least  300' on unlevel grass surfaces with no AD with supervision in order to demo improved community mobility.  Baseline: supervision/CG  Goal status: INITIAL     Plan -     Clinical Impression Statement Today's skilled session focused on assessing pt's LTGs. Pt met LTG #2 and #5 in regards to stairs and balance with eyes closed. Pt able to perform condition 4 of mCTSIB for 30 seconds (previously 3 seconds), indicating improved vestibular input for balance. Pt did not meet LTG #4 in regards to gait speed. Pt's gait speed with no AD was 1.81 ft/sec (previously 1.82 ft/sec), likely that pt has met his plateau in regards to walking speed. Unable to assess gait outdoors today due to time constraints, but pt able to perform incline inside with a blue mat on top for a compliant surface with supervision. Will re-cert for an additional 4 visits to continue to work on gait/balance on outdoor surfaces with no AD to improve community mobility and finalize HEP. Pt with plan to D/C after next POC. LTGs updated and revised as appropriate.     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    Examination-Activity Limitations Stand;Stairs;Squat;Transfers;Locomotion Level    Examination-Participation Restrictions Community Activity   walking his dog   Stability/Clinical Decision Making Stable/Uncomplicated    Rehab Potential Good    PT Frequency 1x / week    PT Duration 8 weeks    PT Treatment/Interventions ADLs/Self Care Home Management;Aquatic Therapy;Electrical Stimulation;DME Instruction;Gait training;Stair training;Functional mobility  training;Therapeutic activities;Therapeutic exercise;Balance training;Neuromuscular re-education;Wheelchair mobility training;Orthotic Fit/Training;Patient/family education;Passive range of motion;Energy conservation;Vestibular    PT Next Visit Plan Gait and incline work on unlevel surfaces outdoors. Work on finalizing pt's [place] and Agree with Plan of Care Patient             Janann August, PT, DPT 11/05/21 12:19 PM

## 2021-11-08 ENCOUNTER — Encounter: Payer: Managed Care, Other (non HMO) | Admitting: Physical Medicine & Rehabilitation

## 2021-11-15 ENCOUNTER — Ambulatory Visit: Payer: Managed Care, Other (non HMO) | Attending: Family Medicine | Admitting: Physical Therapy

## 2021-11-15 ENCOUNTER — Encounter: Payer: Self-pay | Admitting: Physical Therapy

## 2021-11-15 DIAGNOSIS — I69354 Hemiplegia and hemiparesis following cerebral infarction affecting left non-dominant side: Secondary | ICD-10-CM | POA: Diagnosis present

## 2021-11-15 DIAGNOSIS — R2689 Other abnormalities of gait and mobility: Secondary | ICD-10-CM | POA: Insufficient documentation

## 2021-11-15 DIAGNOSIS — R2681 Unsteadiness on feet: Secondary | ICD-10-CM | POA: Insufficient documentation

## 2021-11-15 DIAGNOSIS — M6281 Muscle weakness (generalized): Secondary | ICD-10-CM | POA: Insufficient documentation

## 2021-11-15 NOTE — Therapy (Signed)
OUTPATIENT PHYSICAL THERAPY TREATMENT NOTE   Patient Name: Dennis Mercado MRN: [id number] DOB:May 25, 1981, 41 y.o., male Today's Date: 11/15/2021  PCP: Libby Maw, MD REFERRING PROVIDER: [name], MD   PT End of Session - 11/15/21 0851     Visit Number 15    Number of Visits 18    Date for PT Re-Evaluation 01/04/22   due to potential delay in scheduling   Authorization Type [employer] VL: 60 visits PT, 30 OT - Hard limit.    Authorization - Visit Number 14    Authorization - Number of Visits 60    PT Start Time 0849    PT Stop Time 0929    PT Time Calculation (min) 40 min    Equipment Utilized During Treatment Gait belt    Activity Tolerance Patient tolerated treatment well    Behavior During Therapy [facility] for tasks assessed/performed               History reviewed. No pertinent past medical history. Past Surgical History:  Procedure Laterality Date   WISDOM TOOTH EXTRACTION     Patient Active Problem List   Diagnosis Date Noted   Prediabetes 06/27/2021   Elevated glucose 10/13/2020   History of CVA (cerebrovascular accident) 01/19/2020   Normocytic anemia    Benign essential HTN    Hyponatremia    Acute blood loss anemia    Hypotension due to drugs    Vascular headache    Prerenal azotemia    Sleep disturbance    Leukocytosis    Transaminitis    Marijuana abuse    Essential hypertension    Dysphagia, post-stroke    Left hemiparesis (HCC)    Acute respiratory failure (HCC)    ICH (intracerebral hemorrhage) ([place]) 12/06/2019    REFERRING DIAG: R thalamic hemorrhage w/ L hemiparesis   THERAPY DIAG:  Hemiplegia and hemiparesis following cerebral infarction affecting left non-dominant side (HCC)  Unsteadiness on feet  Muscle weakness (generalized)  Other abnormalities of gait and mobility  PERTINENT HISTORY: No pertinent past medical history.   PRECAUTIONS: Fall   SUBJECTIVE: Went to [employer] and it went great. The stairs went  great at the beach house.    PAIN:  Are you having pain? No   TODAY'S TREATMENT:   GAIT: Gait pattern: step through pattern, decreased arm swing- Left, decreased step length- Right, decreased stance time- Left, decreased stride length, decreased hip/knee flexion- Left, and decreased trunk rotation Distance walked: 1,000 outdoors on grass, pavement.  Assistive device utilized: None Level of assistance: SBA/CGA when performing on grass at times and up and down inclines on grass.  Performed gait outdoors with no AD over unlevel surfaces. Pt with supervision on pavement and supervision/CGA on level grass surfaces. Pt able to demo improved stance time on LLE and incr step length with RLE on outdoor surfaces. Pt needing CGA when going up and down inclines on grass for balance, pt taking smaller steps and ambulates slower but still able to maintain balance.   NMR: On blue air ex;  -with LLE as stance leg tapping RLE down to the floor x15 reps for eccentric control - pt able to perform without UE support when lowering leg, but then does need UE support for balance once foot is on the ground, then performed with LLE as stance leg and marching up RLE x12 reps, pt needing UE support for balance  -with RLE as stance leg, lifting LLE into a contralateral march x10 reps with RUE  support, cues for tall posture and incr hip flexion with LLE, cues to try to push off LLE      PATIENT EDUCATION: Education details: Continue with HEP. Having pt's spouse be with pt when doing slight inclines on grass outdoors.  Person educated: Patient Education method: Explanation Education comprehension: verbalized understanding   HOME EXERCISE PROGRAM: Access code [place] #1   Title Pt will be independent with final HEP for stretching, ROM, and balance. ALL LTGS DUE 10/31/21    Time 4    Period Weeks    Baseline Reports independence with current HEP; will benefit  from progressive HEP   Status On-Going   Target Date 10/31/21      PT LONG TERM GOAL #2   Title Pt will perform stairs backwards x 12 stairs with single hand rail on R and supervision to allow for improved entry into beach home.   Time 4   Baseline Supervision on 11/05/21   Period Weeks    Status MET     PT LONG TERM GOAL #3   Title Pt will ambulate up/down outdoor grass incline with supervision in order to demo improved gait with going in and out to his mailbox with no AD.    Time 4   Baseline Supervision indoors on incline, not able to perform outdoors today due to time constraints.    Period Weeks    Status ON-GOING      PT LONG TERM GOAL #4   Title Pt will improve gait speed to at least 1.9 ft/sec with no AD in order to demo decr fall risk.    Baseline 18.03 secs without AD (1.82 ft/sec); 17.09 seconds with SPC (1.92 ft/sec)  On 11/05/21: 18.1 seconds without AD = 1.81 ft/sec   Time 4   Period Weeks    Status NOT MET     PT LONG TERM GOAL #5   Title Pt will improve condition 4 of mCTSIB to at least 12 seconds in order to demo improved vestibular input for balance.    Baseline condition 4= 3 seconds (feet slightly apart)   Condition 4= 30 seconds (feet together)   Time 4   Period Weeks    Status MET      ON-GOING/UPDATED LTGS FOR RE-CERT LONG TERM GOALS: ALL LTGS DUE 12/03/21  Pt will be independent with final HEP for stretching, ROM, and balance.  Baseline: Reports independence with current HEP; will benefit from progressive HEP Goal status: IN PROGRESS  2.  Pt will ambulate up/down outdoor grass incline with supervision in order to demo improved gait with going in and out to his mailbox with no AD.  Baseline: Supervision indoors on incline, not able to perform outdoors today due to time constraints.   Goal status: IN PROGRESS  3.  Pt will ambulate at least 300' on unlevel grass surfaces with no AD with supervision in order to demo improved community mobility.   Baseline: supervision/CG  Goal status: INITIAL     Plan -     Clinical Impression Statement Today's skilled session focused on ambulation on outdoor surfaces with no AD on grass and pavement. Pt able to ambulate with no AD on pavement with supervision and on grass with supervision/occasional CGA. Pt needing CGA when performing grass with an incline as pt tends to take smaller steps and slow down gait speed. Remainder of session focused on balance on compliant  surfaces with SLS tasks/strengthening with pt needing UE support. Will continue to progress towards LTGs.     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    Examination-Activity Limitations Stand;Stairs;Squat;Transfers;Locomotion Level    Examination-Participation Restrictions Community Activity   walking his dog   Stability/Clinical Decision Making Stable/Uncomplicated    Rehab Potential Good    PT Frequency 1x / week    PT Duration 8 weeks    PT Treatment/Interventions ADLs/Self Care Home Management;Aquatic Therapy;Electrical Stimulation;DME Instruction;Gait training;Stair training;Functional mobility training;Therapeutic activities;Therapeutic exercise;Balance training;Neuromuscular re-education;Wheelchair mobility training;Orthotic Fit/Training;Patient/family education;Passive range of motion;Energy conservation;Vestibular    PT Next Visit Plan Gait and incline work on unlevel surfaces outdoors. Work on finalizing pt's [place] and Agree with Plan of Care Patient             Janann August, PT, DPT 11/15/21 9:32 AM

## 2021-11-20 ENCOUNTER — Ambulatory Visit: Payer: Managed Care, Other (non HMO) | Admitting: Physical Therapy

## 2021-11-20 ENCOUNTER — Encounter: Payer: Self-pay | Admitting: Physical Therapy

## 2021-11-20 DIAGNOSIS — I69354 Hemiplegia and hemiparesis following cerebral infarction affecting left non-dominant side: Secondary | ICD-10-CM

## 2021-11-20 DIAGNOSIS — M6281 Muscle weakness (generalized): Secondary | ICD-10-CM

## 2021-11-20 DIAGNOSIS — R2689 Other abnormalities of gait and mobility: Secondary | ICD-10-CM

## 2021-11-20 DIAGNOSIS — R2681 Unsteadiness on feet: Secondary | ICD-10-CM

## 2021-11-20 NOTE — Therapy (Signed)
OUTPATIENT PHYSICAL THERAPY TREATMENT NOTE   Patient Name: Dennis Mercado MRN: [id number] DOB:1980/08/25, 41 y.o., male Today's Date: 11/20/2021  PCP: Libby Maw, MD REFERRING PROVIDER: [name], MD   PT End of Session - 11/20/21 [phone]     Visit Number 16    Number of Visits 18    Date for PT Re-Evaluation 01/04/22   due to potential delay in scheduling   Authorization Type [employer] VL: 60 visits PT, 30 OT - Hard limit.    Authorization - Visit Number 15    Authorization - Number of Visits 60    PT Start Time 0848    PT Stop Time 0929    PT Time Calculation (min) 41 min    Equipment Utilized During Treatment Gait belt    Activity Tolerance Patient tolerated treatment well    Behavior During Therapy WFL for tasks assessed/performed               History reviewed. No pertinent past medical history. Past Surgical History:  Procedure Laterality Date   WISDOM TOOTH EXTRACTION     Patient Active Problem List   Diagnosis Date Noted   Prediabetes 06/27/2021   Elevated glucose 10/13/2020   History of CVA (cerebrovascular accident) 01/19/2020   Normocytic anemia    Benign essential HTN    Hyponatremia    Acute blood loss anemia    Hypotension due to drugs    Vascular headache    Prerenal azotemia    Sleep disturbance    Leukocytosis    Transaminitis    Marijuana abuse    Essential hypertension    Dysphagia, post-stroke    Left hemiparesis (HCC)    Acute respiratory failure (HCC)    ICH (intracerebral hemorrhage) ([place]) 12/06/2019    REFERRING DIAG: R thalamic hemorrhage w/ L hemiparesis   THERAPY DIAG:  Unsteadiness on feet  Muscle weakness (generalized)  Other abnormalities of gait and mobility  Hemiplegia and hemiparesis following cerebral infarction affecting left non-dominant side ([place])  PERTINENT HISTORY: No pertinent past medical history.   PRECAUTIONS: Fall   SUBJECTIVE: Tried going up and down the driveway without the cane (3  times) and it went well. April was there with him.   PAIN:  Are you having pain? No   TODAY'S TREATMENT:   STRENGTHENING: Scifit level using BLE's only level 5.0 x 8 minutes for strengthening, ROM and activity tolerance and to warm up prior to gait outdoors.   GAIT: Gait pattern: step through pattern, decreased arm swing- Left, decreased step length- Right, decreased stance time- Left, decreased stride length, decreased hip/knee flexion- Left, and decreased trunk rotation Distance walked: 800' outdoors on grass, pavement.  Assistive device utilized: None Level of assistance: SBA when performing on grass/pavement (occasional CGA on grass), CGA when going up and down inclines on grass.   Performed smaller curbs with supervision and one larger curb on and off the grass with CGA.   Performed gait outdoors with no AD over unlevel surfaces. Pt with supervision on pavement and supervision/CGA on level grass surfaces. Pt able to demo improved stance time on LLE and incr step length with RLE on outdoor surfaces. Pt needing CGA when going up and down inclines on grass for balance, pt taking smaller steps and ambulates slower but still able to maintain balance. Performed smaller inclines as well as a more gradual incline (to simulate the one in pt's yard). Pt more challenged when doing a longer, gradual incline.  PATIENT EDUCATION: Education details: Continue with HEP. Having pt's spouse be with pt when doing inclines on grass outdoors.  Person educated: Patient Education method: Explanation Education comprehension: verbalized understanding   HOME EXERCISE PROGRAM: Access code [email or web address]     ON-GOING/UPDATED LTGS FOR RE-CERT LONG TERM GOALS: ALL LTGS DUE 12/03/21  Pt will be independent with final HEP for stretching, ROM, and balance.  Baseline: Reports independence with current HEP; will benefit from progressive HEP Goal status: IN PROGRESS  2.  Pt will ambulate up/down outdoor  grass incline with supervision in order to demo improved gait with going in and out to his mailbox with no AD.  Baseline: Supervision indoors on incline, not able to perform outdoors today due to time constraints.   Goal status: IN PROGRESS  3.  Pt will ambulate at least 300' on unlevel grass surfaces with no AD with supervision in order to demo improved community mobility.  Baseline: supervision/CG  Goal status: INITIAL     Plan -     Clinical Impression Statement Today's skilled session continued to focus on ambulation on outdoor surfaces with no AD on grass and pavement. Pt needing CGA when going on smaller inclines and more gradual inclines on grass. Pt performs more slowly and with decr step length. Pt to plan to practice at home with wife present next to him. Pt more fatigued after gait outdoors today. Will continue to progress towards LTGs.     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    Examination-Activity Limitations Stand;Stairs;Squat;Transfers;Locomotion Level    Examination-Participation Restrictions Community Activity   walking his dog   Stability/Clinical Decision Making Stable/Uncomplicated    Rehab Potential Good    PT Frequency 1x / week    PT Duration 8 weeks    PT Treatment/Interventions ADLs/Self Care Home Management;Aquatic Therapy;Electrical Stimulation;DME Instruction;Gait training;Stair training;Functional mobility training;Therapeutic activities;Therapeutic exercise;Balance training;Neuromuscular re-education;Wheelchair mobility training;Orthotic Fit/Training;Patient/family education;Passive range of motion;Energy conservation;Vestibular    PT Next Visit Plan Gait and incline work on unlevel surfaces outdoors. Work on finalizing pt's HEP and D/C at end of current appts.     PT Home Exercise Plan [email or web address]    Consulted and Agree with Plan of Care Patient              Janann August, PT, DPT 11/20/21 10:14 AM

## 2021-11-26 ENCOUNTER — Ambulatory Visit: Payer: Managed Care, Other (non HMO)

## 2021-11-26 DIAGNOSIS — I69354 Hemiplegia and hemiparesis following cerebral infarction affecting left non-dominant side: Secondary | ICD-10-CM | POA: Diagnosis not present

## 2021-11-26 DIAGNOSIS — M6281 Muscle weakness (generalized): Secondary | ICD-10-CM

## 2021-11-26 DIAGNOSIS — R2681 Unsteadiness on feet: Secondary | ICD-10-CM

## 2021-11-26 DIAGNOSIS — R2689 Other abnormalities of gait and mobility: Secondary | ICD-10-CM

## 2021-11-26 NOTE — Therapy (Signed)
OUTPATIENT PHYSICAL THERAPY TREATMENT NOTE   Patient Name: Dennis Mercado MRN: [id number] DOB:1980/07/15, 41 y.o., male Today's Date: 11/26/2021  PCP: Libby Maw, MD REFERRING PROVIDER: [name], MD   PT End of Session - 11/26/21 0853     Visit Number 17    Number of Visits 18    Date for PT Re-Evaluation 01/04/22   due to potential delay in scheduling   Authorization Type [employer] VL: 60 visits PT, 30 OT - Hard limit.    Authorization - Visit Number 16    Authorization - Number of Visits 48    PT Start Time [phone]   patient arriving late   PT Stop Time 0930    PT Time Calculation (min) 40 min    Equipment Utilized During Treatment Gait belt    Activity Tolerance Patient tolerated treatment well    Behavior During Therapy [facility] for tasks assessed/performed             History reviewed. No pertinent past medical history. Past Surgical History:  Procedure Laterality Date   WISDOM TOOTH EXTRACTION     Patient Active Problem List   Diagnosis Date Noted   Prediabetes 06/27/2021   Elevated glucose 10/13/2020   History of CVA (cerebrovascular accident) 01/19/2020   Normocytic anemia    Benign essential HTN    Hyponatremia    Acute blood loss anemia    Hypotension due to drugs    Vascular headache    Prerenal azotemia    Sleep disturbance    Leukocytosis    Transaminitis    Marijuana abuse    Essential hypertension    Dysphagia, post-stroke    Left hemiparesis (HCC)    Acute respiratory failure (HCC)    ICH (intracerebral hemorrhage) ([place]) 12/06/2019    REFERRING DIAG: R thalamic hemorrhage w/ L hemiparesis   THERAPY DIAG:  Unsteadiness on feet  Muscle weakness (generalized)  Other abnormalities of gait and mobility  PERTINENT HISTORY: No pertinent past medical history.   PRECAUTIONS: Fall   SUBJECTIVE: Patient reports no new changes complaints. No falls.   PAIN:  Are you having pain? No   TODAY'S TREATMENT:    STRENGTHENING: NuStep Level using BLE's only level 6.0 x 8 minutes for strengthening, ROM and activity tolerance and to warm up prior to gait outdoors. Increased fatigue noted with increased resistance today. Overall patient tolerating well.   Reviewed Entire HEP and Progressed:  Access Code: [email or web address] URL: [email or web address] Date: 11/26/2021 Prepared by: Baldomero Lamy  Program Notes Next to your couch on the floor: -in tall kneeling: -mini squats going down to left side and coming back to middle x10 reps-staying on left side gently stepping right leg out and in (really focus on shifting to your left side) x10 reps -in half kneeling with left leg forwards practice gently rocking forwards and back -can try bringing right leg forwards and back to middle if April is next to you   Exercises - Seated Hamstring Stretch (Mirrored)  - 1 x daily - 5 x weekly - 3 reps - 30 hold - Sit to/from Stand in Stride position  - 1 x daily - 5 x weekly - 1 sets - 10 reps - Forward Step Up  - 1 x daily - 5 x weekly - 1 sets - 10 reps - Forward Step Down with Counter Support at Side  - 1 x daily - 5 x weekly - 1 sets - 10 reps - Romberg Stance with Eyes Closed  -  1 x daily - 5 x weekly - 1 sets - 3 reps - 30 seconds hold - Standing Balance in Corner with Eyes Closed and Head Turns/Nods  - 1 x daily - 5 x weekly - 1 sets - 10 reps - Tall Kneeling Hip Hinge  - 1 x daily - 5 x weekly - 1 sets - 10 reps  Highlighted exercises are new additions/progression completed today.   GAIT: Gait pattern: step through pattern, decreased arm swing- Left, decreased step length- Right, decreased stance time- Left, decreased stride length, decreased hip/knee flexion- Left, and decreased trunk rotation Distance walked: clinic distances with activities Assistive device utilized: None Level of assistance: SBA when performing on grass/pavement (occasional CGA on grass), CGA when going up and down inclines on  grass.    PATIENT EDUCATION: Education details: HEP Update.   Person educated: Patient Education method: Explanation Education comprehension: verbalized understanding   HOME EXERCISE PROGRAM: Access code [email or web address]     ON-GOING/UPDATED LTGS FOR RE-CERT LONG TERM GOALS: ALL LTGS DUE 12/03/21  Pt will be independent with final HEP for stretching, ROM, and balance.  Baseline: Reports independence with current HEP; will benefit from progressive HEP Goal status: IN PROGRESS  2.  Pt will ambulate up/down outdoor grass incline with supervision in order to demo improved gait with going in and out to his mailbox with no AD.  Baseline: Supervision indoors on incline, not able to perform outdoors today due to time constraints.   Goal status: IN PROGRESS  3.  Pt will ambulate at least 300' on unlevel grass surfaces with no AD with supervision in order to demo improved community mobility.  Baseline: supervision/CG  Goal status: INITIAL     Plan -     Clinical Impression Statement Today's skilled session continued to focus on endurance training. Rest of session spent reviewing and updating HEP based upon current progress to have updated and progressive HEP upon d/c. Patient tolerating all activities well. Will plan to d/c next visit.    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    Examination-Activity Limitations Stand;Stairs;Squat;Transfers;Locomotion Level    Examination-Participation Restrictions Community Activity   walking his dog   Stability/Clinical Decision Making Stable/Uncomplicated    Rehab Potential Good    PT Frequency 1x / week    PT Duration 8 weeks    PT Treatment/Interventions ADLs/Self Care Home Management;Aquatic Therapy;Electrical Stimulation;DME Instruction;Gait training;Stair training;Functional mobility training;Therapeutic activities;Therapeutic exercise;Balance  training;Neuromuscular re-education;Wheelchair mobility training;Orthotic Fit/Training;Patient/family education;Passive range of motion;Energy conservation;Vestibular    PT Next Visit Plan Gait and incline work on unlevel surfaces outdoors. Work on finalizing pt's HEP and D/C at end of current appts. Check Goals    PT Home Exercise Plan [email or web address]    Consulted and Agree with Plan of Care Patient             Jones Bales, PT, DPT 11/26/21 9:30 AM

## 2021-12-03 ENCOUNTER — Ambulatory Visit: Payer: Managed Care, Other (non HMO)

## 2021-12-03 DIAGNOSIS — R2681 Unsteadiness on feet: Secondary | ICD-10-CM

## 2021-12-03 DIAGNOSIS — R2689 Other abnormalities of gait and mobility: Secondary | ICD-10-CM

## 2021-12-03 DIAGNOSIS — I69354 Hemiplegia and hemiparesis following cerebral infarction affecting left non-dominant side: Secondary | ICD-10-CM | POA: Diagnosis not present

## 2021-12-03 DIAGNOSIS — M6281 Muscle weakness (generalized): Secondary | ICD-10-CM

## 2021-12-03 NOTE — Therapy (Signed)
OUTPATIENT PHYSICAL THERAPY TREATMENT NOTE/DISCHARGE SUMMARY   Patient Name: Dennis Mercado MRN: [id number] DOB:[birth year], 41 y.o., male Today's Date: 12/03/2021  PCP: Libby Maw, MD REFERRING PROVIDER: [name], MD  PHYSICAL THERAPY DISCHARGE SUMMARY  Visits from Start of Care: 18  Current functional level related to goals / functional outcomes: See Clinical Impression    Remaining deficits: Abnormal Gait, Imbalance   Education / Equipment: HEP Provided   Patient agrees to discharge. Patient goals were met. Patient is being discharged due to meeting the stated rehab goals.    PT End of Session - 12/03/21 0805     Visit Number 18    Number of Visits 18    Date for PT Re-Evaluation 01/04/22   due to potential delay in scheduling   Authorization Type [employer] VL: 60 visits PT, 30 OT - Hard limit.    Authorization - Visit Number 17    Authorization - Number of Visits 60    PT Start Time 0802    PT Stop Time 0844    PT Time Calculation (min) 42 min    Equipment Utilized During Treatment Gait belt    Activity Tolerance Patient tolerated treatment well    Behavior During Therapy WFL for tasks assessed/performed             History reviewed. No pertinent past medical history. Past Surgical History:  Procedure Laterality Date   WISDOM TOOTH EXTRACTION     Patient Active Problem List   Diagnosis Date Noted   Prediabetes 06/27/2021   Elevated glucose 10/13/2020   History of CVA (cerebrovascular accident) 01/19/2020   Normocytic anemia    Benign essential HTN    Hyponatremia    Acute blood loss anemia    Hypotension due to drugs    Vascular headache    Prerenal azotemia    Sleep disturbance    Leukocytosis    Transaminitis    Marijuana abuse    Essential hypertension    Dysphagia, post-stroke    Left hemiparesis (HCC)    Acute respiratory failure (HCC)    ICH (intracerebral hemorrhage) ([place]) 12/06/2019    REFERRING DIAG: R thalamic  hemorrhage w/ L hemiparesis   THERAPY DIAG:  Unsteadiness on feet  Muscle weakness (generalized)  Other abnormalities of gait and mobility  PERTINENT HISTORY: No pertinent past medical history.   PRECAUTIONS: Fall   SUBJECTIVE: Did not get to try the new exercises. But has been walking outdoors on unlevel surfaces without AD. No other new changes/complaints.   PAIN:  Are you having pain? No   TODAY'S TREATMENT:   GAIT: Gait pattern: step through pattern, decreased arm swing- Left, decreased step length- Right, decreased stance time- Left, decreased stride length, decreased hip/knee flexion- Left, and decreased trunk rotation Distance walked: 350' without AD on outdoor unlevel grass surfaces and paved surfaces. Patient using cane intermittent due to stiffness and increased tone. Patient Mod I at times, but intermittent SBA with more challenging surfaces incline. Assistive device utilized: Single point cane and None Level of assistance: SBA  RAMP:  Level of Assistance: SBA and CGA Assistive device utilized: Single point cane and None Ramp Comments: completed gait outdoors on grass incline/ramp down and up, patient require intermittent use of SPC. Patient fearful at times requiring intermittent standing rest break.   CURB:  Level of Assistance: Modified independence Assistive device utilized: Single point cane Curb Comments: ascend/descend outdoor curb level with use of SPC, Mod I. No imbalance noted with curb negotiation.  Patient denies questions/concerns at this time regarding HEP.    PATIENT EDUCATION: Education details: Progress toward LTGs; Compliance with HEP  Person educated: Patient Education method: Explanation Education comprehension: verbalized understanding   HOME EXERCISE PROGRAM: Access code [email or web address]    ON-GOING/UPDATED LTGS FOR RE-CERT LONG TERM GOALS: ALL LTGS DUE 12/03/21  Pt will be independent with final HEP for stretching, ROM, and balance.   Baseline: Reports independence with current HEP; will benefit from progressive HEP; reports independence with final progressive HEP Goal status: MET  2.  Pt will ambulate up/down outdoor grass incline with supervision in order to demo improved gait with going in and out to his mailbox with no AD.  Baseline: Supervision indoors on incline, not able to perform outdoors today due to time constraints; No   Goal status: PARTIALLY MET  3.  Pt will ambulate at least 300' on unlevel grass surfaces with no AD with supervision in order to demo improved community mobility.  Baseline: supervision/CG; 350' on unlevel surfaces with no AD and supervision  Goal status: MET     Plan -     Clinical Impression Statement Completed assessment of patient's progress toward LTGs. Patient able to meet/partially meet all LTGs. Patient has demonstrated progress with community mobility on level and unlevel surfaces without use of AD. Still some challenge with ramp/incline dependent upon how steep the ramp is. Patient able to be discharged at this time.    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    Examination-Activity Limitations Stand;Stairs;Squat;Transfers;Locomotion Level    Examination-Participation Restrictions Community Activity   walking his dog   Stability/Clinical Decision Making Stable/Uncomplicated    Rehab Potential Good    PT Frequency 1x / week    PT Duration 8 weeks    PT Treatment/Interventions ADLs/Self Care Home Management;Aquatic Therapy;Electrical Stimulation;DME Instruction;Gait training;Stair training;Functional mobility training;Therapeutic activities;Therapeutic exercise;Balance training;Neuromuscular re-education;Wheelchair mobility training;Orthotic Fit/Training;Patient/family education;Passive range of motion;Energy conservation;Vestibular    PT Next Visit Plan D/C this visit.    PT  Home Exercise Plan [email or web address]    Consulted and Agree with Plan of Care Patient             Jones Bales, PT, DPT 12/03/21 9:48 AM

## 2021-12-11 ENCOUNTER — Encounter: Payer: Self-pay | Admitting: Physical Medicine & Rehabilitation

## 2021-12-11 ENCOUNTER — Encounter
Payer: Managed Care, Other (non HMO) | Attending: Physical Medicine & Rehabilitation | Admitting: Physical Medicine & Rehabilitation

## 2021-12-11 VITALS — BP 123/87 | HR 82 | Ht 67.0 in | Wt 154.4 lb

## 2021-12-11 DIAGNOSIS — G8114 Spastic hemiplegia affecting left nondominant side: Secondary | ICD-10-CM

## 2021-12-11 NOTE — Progress Notes (Signed)
Dysport Injection for spasticity using needle EMG guidance  Dilution: 200 Units/ml Indication: Severe spasticity which interferes with ADL,mobility and/or  hygiene and is unresponsive to medication management and other conservative care Informed consent was obtained after describing risks and benefits of the procedure with the patient. This includes bleeding, bruising, infection, excessive weakness, or medication side effects. A REMS form is on file and signed. Needle:  needle electrode Number of units per muscle  Biceps 400 units   FDP 200 units FDS 100 units FCR 200 units FPL 100 units All injections were done after obtaining appropriate EMG activity and after negative drawback for blood. The patient tolerated the procedure well. Post procedure instructions were given. A followup appointment was made.

## 2021-12-20 ENCOUNTER — Encounter: Payer: Self-pay | Admitting: Family Medicine

## 2021-12-20 ENCOUNTER — Ambulatory Visit (INDEPENDENT_AMBULATORY_CARE_PROVIDER_SITE_OTHER): Payer: Managed Care, Other (non HMO) | Admitting: Family Medicine

## 2021-12-20 VITALS — BP 122/80 | HR 81 | Temp 97.8°F | Ht 67.0 in | Wt 153.0 lb

## 2021-12-20 DIAGNOSIS — Z125 Encounter for screening for malignant neoplasm of prostate: Secondary | ICD-10-CM | POA: Insufficient documentation

## 2021-12-20 DIAGNOSIS — R7303 Prediabetes: Secondary | ICD-10-CM

## 2021-12-20 DIAGNOSIS — F4321 Adjustment disorder with depressed mood: Secondary | ICD-10-CM | POA: Diagnosis not present

## 2021-12-20 DIAGNOSIS — I10 Essential (primary) hypertension: Secondary | ICD-10-CM

## 2021-12-20 HISTORY — DX: Encounter for screening for malignant neoplasm of prostate: Z12.5

## 2021-12-20 HISTORY — DX: Adjustment disorder with depressed mood: F43.21

## 2021-12-20 LAB — BASIC METABOLIC PANEL
BUN: 11 mg/dL (ref 6–23)
CO2: 31 mEq/L (ref 19–32)
Calcium: 10.2 mg/dL (ref 8.4–10.5)
Chloride: 99 mEq/L (ref 96–112)
Creatinine, Ser: 1.29 mg/dL (ref 0.40–1.50)
GFR: 69.17 mL/min (ref >60.00)
Glucose, Bld: 92 mg/dL (ref 70–99)
Potassium: 4.5 mEq/L (ref 3.5–5.1)
Sodium: 136 mEq/L (ref 135–145)

## 2021-12-20 LAB — HEMOGLOBIN A1C: Hgb A1c MFr Bld: 6.1 % (ref 4.6–6.5)

## 2021-12-20 LAB — PSA: PSA: 1.84 ng/mL (ref 0.10–4.00)

## 2021-12-20 NOTE — Progress Notes (Signed)
Established Patient Office Visit  Subjective   Patient ID: Dennis Mercado, male    DOB: 08-Jan-1981  Age: 41 y.o. MRN: [id number]  Chief Complaint  Patient presents with   Office Visit    HTN F/u  Checks BP daily     HPI follow-up of hypertension and prediabetes.  Continues atorvastatin issue for hyperlipidemia.  Continues to exercise, lose weight and avoid carbohydrates.  Blood pressures well controlled with amlodipine carvedilol and lisinopril.  His mom passed in her sleep at age 29 1 week ago today.  He notes that she had a history of hypertension and would not always take her medicine.  Continues physical therapy for left hemiparesis.  Mom's brother is having prostate issues.  Patient would like to be checked.  He himself is having a problem with urine flow frequency or urgency.    Review of Systems  Constitutional: Negative.   HENT: Negative.    Eyes:  Negative for blurred vision, discharge and redness.  Respiratory: Negative.    Cardiovascular: Negative.   Gastrointestinal:  Negative for abdominal pain.  Genitourinary: Negative.  Negative for frequency, hematuria and urgency.  Musculoskeletal: Negative.  Negative for myalgias.  Skin:  Negative for rash.  Neurological:  Positive for weakness. Negative for tingling and loss of consciousness.  Endo/Heme/Allergies:  Negative for polydipsia.      Objective:     BP 122/80 (BP Location: Right Arm, Patient Position: Sitting, Cuff Size: Small)   Pulse 81   Temp 97.8 F (36.6 C) (Temporal)   Ht '5\' 7"'$  (1.702 m)   Wt 153 lb (69.4 kg)   SpO2 98%   BMI 23.96 kg/m  BP Readings from Last 3 Encounters:  12/20/21 122/80  12/11/21 123/87  08/31/21 122/84   Wt Readings from Last 3 Encounters:  12/20/21 153 lb (69.4 kg)  12/11/21 154 lb 6.4 oz (70 kg)  08/31/21 155 lb (70.3 kg)      Physical Exam Constitutional:      General: He is not in acute distress.    Appearance: Normal appearance. He is not ill-appearing,  toxic-appearing or diaphoretic.  HENT:     Head: Normocephalic and atraumatic.     Right Ear: External ear normal.     Left Ear: External ear normal.  Eyes:     General: No scleral icterus.       Right eye: No discharge.        Left eye: No discharge.     Extraocular Movements: Extraocular movements intact.     Conjunctiva/sclera: Conjunctivae normal.  Pulmonary:     Effort: Pulmonary effort is normal. No respiratory distress.  Skin:    General: Skin is warm and dry.  Neurological:     Mental Status: He is alert and oriented to person, place, and time.  Psychiatric:        Mood and Affect: Mood normal.        Behavior: Behavior normal.      No results found for any visits on 12/20/21.    The ASCVD Risk score (Arnett DK, et al., 2019) failed to calculate for the following reasons:   The valid total cholesterol range is 130 to 320 mg/dL    Assessment & Plan:   Problem List Items Addressed This Visit       Cardiovascular and Mediastinum   Benign essential HTN   Relevant Orders   Basic metabolic panel     Other   Prediabetes   Relevant Orders  Basic metabolic panel   Hemoglobin A1c   Screening for prostate cancer   Relevant Orders   PSA   Grieving - Primary    Return in about 6 months (around 06/22/2022), or if symptoms worsen or fail to improve.    Libby Maw, MD

## 2021-12-23 ENCOUNTER — Other Ambulatory Visit: Payer: Self-pay | Admitting: Family Medicine

## 2021-12-23 DIAGNOSIS — I10 Essential (primary) hypertension: Secondary | ICD-10-CM

## 2021-12-24 ENCOUNTER — Other Ambulatory Visit: Payer: Self-pay | Admitting: Family Medicine

## 2021-12-24 DIAGNOSIS — I10 Essential (primary) hypertension: Secondary | ICD-10-CM

## 2022-01-13 ENCOUNTER — Other Ambulatory Visit: Payer: Self-pay | Admitting: Family Medicine

## 2022-01-13 DIAGNOSIS — I10 Essential (primary) hypertension: Secondary | ICD-10-CM

## 2022-02-01 ENCOUNTER — Ambulatory Visit: Payer: Managed Care, Other (non HMO) | Admitting: Physical Medicine & Rehabilitation

## 2022-02-05 ENCOUNTER — Encounter
Payer: Managed Care, Other (non HMO) | Attending: Physical Medicine & Rehabilitation | Admitting: Physical Medicine & Rehabilitation

## 2022-02-05 ENCOUNTER — Encounter: Payer: Self-pay | Admitting: Physical Medicine & Rehabilitation

## 2022-02-05 VITALS — BP 118/82 | HR 88 | Ht 67.0 in | Wt 159.4 lb

## 2022-02-05 DIAGNOSIS — G8114 Spastic hemiplegia affecting left nondominant side: Secondary | ICD-10-CM | POA: Insufficient documentation
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
## 2022-03-24 ENCOUNTER — Other Ambulatory Visit: Payer: Self-pay | Admitting: Family Medicine

## 2022-03-24 DIAGNOSIS — E1169 Type 2 diabetes mellitus with other specified complication: Secondary | ICD-10-CM

## 2022-03-28 ENCOUNTER — Ambulatory Visit: Payer: Managed Care, Other (non HMO) | Admitting: Physical Medicine & Rehabilitation

## 2022-04-08 ENCOUNTER — Telehealth: Payer: Self-pay

## 2022-04-08 NOTE — Telephone Encounter (Signed)
[name] has a requested a  direct referral to be sent to [facility] for Dennis Mercado on [address]. It will need to state "Aquatic  Occupational Therapy.   - Call back phone number [phone].

## 2022-04-09 ENCOUNTER — Other Ambulatory Visit: Payer: Self-pay

## 2022-04-09 DIAGNOSIS — G8114 Spastic hemiplegia affecting left nondominant side: Secondary | ICD-10-CM

## 2022-04-17 ENCOUNTER — Other Ambulatory Visit: Payer: Self-pay | Admitting: Physical Medicine & Rehabilitation

## 2022-04-18 MED ORDER — BOTOX 200 UNITS IJ SOLR: 400.0000 [IU] | INTRAMUSCULAR | 3 refills | Status: DC

## 2022-04-19 ENCOUNTER — Other Ambulatory Visit: Payer: Self-pay

## 2022-04-19 ENCOUNTER — Ambulatory Visit: Payer: Managed Care, Other (non HMO) | Attending: Physical Medicine & Rehabilitation | Admitting: Occupational Therapy

## 2022-04-19 DIAGNOSIS — R2681 Unsteadiness on feet: Secondary | ICD-10-CM | POA: Diagnosis present

## 2022-04-19 DIAGNOSIS — R29818 Other symptoms and signs involving the nervous system: Secondary | ICD-10-CM | POA: Insufficient documentation

## 2022-04-19 DIAGNOSIS — R2689 Other abnormalities of gait and mobility: Secondary | ICD-10-CM | POA: Diagnosis present

## 2022-04-19 DIAGNOSIS — I69354 Hemiplegia and hemiparesis following cerebral infarction affecting left non-dominant side: Secondary | ICD-10-CM | POA: Insufficient documentation

## 2022-04-19 DIAGNOSIS — M6281 Muscle weakness (generalized): Secondary | ICD-10-CM | POA: Diagnosis present

## 2022-04-19 DIAGNOSIS — R208 Other disturbances of skin sensation: Secondary | ICD-10-CM | POA: Diagnosis present

## 2022-04-19 DIAGNOSIS — G8114 Spastic hemiplegia affecting left nondominant side: Secondary | ICD-10-CM | POA: Insufficient documentation

## 2022-04-19 NOTE — Therapy (Signed)
OUTPATIENT OCCUPATIONAL THERAPY NEURO EVALUATION  Patient Name: Dennis Mercado MRN: [id number] DOB:1981/03/23, 41 y.o., male Today's Date: 04/19/2022  PCP: Libby Maw, MD REFERRING PROVIDER: [name], MD   OT End of Session - 04/19/22 1306     Visit Number 1    Number of Visits 17    Date for OT Re-Evaluation 06/14/22    Authorization Type [employer]    Authorization Time Period per patient 30 visit OT limit    OT Start Time 0850    OT Stop Time 0932    OT Time Calculation (min) 42 min    Activity Tolerance Patient tolerated treatment well    Behavior During Therapy [facility] for tasks assessed/performed             No past medical history on file. Past Surgical History:  Procedure Laterality Date   WISDOM TOOTH EXTRACTION     Patient Active Problem List   Diagnosis Date Noted   Screening for prostate cancer 12/20/2021   Grieving 12/20/2021   Prediabetes 06/27/2021   Elevated glucose 10/13/2020   Mixed hyperlipidemia 10/05/2020   History of CVA (cerebrovascular accident) 01/19/2020   Normocytic anemia    Benign essential HTN    Hyponatremia    Acute blood loss anemia    Hypotension due to drugs    Vascular headache    Prerenal azotemia    Sleep disturbance    Leukocytosis    Transaminitis    Marijuana abuse    Essential hypertension    Dysphagia, post-stroke    Left hemiparesis (HCC)    Acute respiratory failure (HCC)    ICH (intracerebral hemorrhage) ([place]) 12/06/2019    ONSET DATE: 12/06/19 (recent referral: 04/09/22)  REFERRING DIAG: G81.14 (ICD-10-CM) - Left spastic hemiplegia  THERAPY DIAG:  Spastic hemiplegia of left nondominant side as late effect of cerebral infarction ([place])  Hemiplegia and hemiparesis following cerebral infarction affecting left non-dominant side (HCC)  Muscle weakness (generalized)  Unsteadiness on feet  Other abnormalities of gait and mobility  Other symptoms and signs involving the nervous  system  Rationale for Evaluation and Treatment: Rehabilitation  SUBJECTIVE:   SUBJECTIVE STATEMENT: Pt is seeking return to OT to continue to address LUE as well as pursue aquatic therapy services.  He reports that he has joined [employer] and wants to be able to get in/out of the pool independently and use that service.  He has had services at [facility], but is wanting to transition back to the clinic with focus on aquatic. Pt accompanied by: self  PERTINENT HISTORY: Pt. presented to ED on 12/06/2019 with left side weakness and altered mental status. 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 Received inpatient rehab; was discharged 01/12/20. Pt returns to OT after botox on 02/05/22 by Dr. Letta Pate; HTN, left inattention, apraxia   PRECAUTIONS: Fall  WEIGHT BEARING RESTRICTIONS: No  PAIN:  Are you having pain? No  FALLS: Has patient fallen in last 6 months? No  LIVING ENVIRONMENT: Lives with: lives with their spouse Lives in: House/apartment Stairs: No, threshold in to house Has following equipment at home: Single point cane, Shower bench, and Grab bars  PLOF: Independent  PATIENT GOALS: to get in and out of the pool comfortably  OBJECTIVE:   HAND DOMINANCE: Right  ADLs: Overall ADLs: Mod I with bathing and dressing.  Utilizes tub transfer bench to transfer in/out of shower and  sits to wash lower legs and feet.   Transfers/ambulation related to ADLs: Eating: wife will assist with cutting foods, able to stabilize with L hand to cut items Equipment: Transfer tub bench, Grab bars, Walk in shower, Long handled sponge, and hand held shower head  IADLs: Shopping: will shop with wife, but can go pick up a few items Mod I while pushing cart Light housekeeping: Mod I Meal Prep: Mod I, able to cut vegetables with increased time Community mobility:  driving Medication management: Mod I, able to open and dispense meds Financial management: Mod I  MOBILITY STATUS: Needs Assist: utilizes small based quad cane Mod I  ACTIVITY TOLERANCE: Activity tolerance: WFL for tasks assessed on eval  FUNCTIONAL OUTCOME MEASURES: Quick Dash: 20.45  UPPER EXTREMITY ROM:    Active ROM Right eval Left eval  Shoulder flexion  113  Shoulder abduction    Shoulder adduction    Shoulder extension    Shoulder internal rotation    Shoulder external rotation    Elbow flexion  120  Elbow extension  -16  Wrist flexion  58  Wrist extension  -32  Wrist ulnar deviation    Wrist radial deviation    Wrist pronation    Wrist supination    (Blank rows = not tested)  Passive ROM Right eval Left eval  Shoulder flexion  150  Shoulder abduction    Shoulder adduction    Shoulder extension    Shoulder internal rotation    Shoulder external rotation    Elbow flexion  WNL  Elbow extension  WNL  Wrist flexion  62  Wrist extension  -8  Wrist ulnar deviation    Wrist radial deviation    Wrist pronation    Wrist supination    (Blank rows = not tested)  UPPER EXTREMITY MMT:     MMT Right eval Left eval  Shoulder flexion    Shoulder abduction    Shoulder adduction    Shoulder extension    Shoulder internal rotation    Shoulder external rotation    Middle trapezius    Lower trapezius    Elbow flexion    Elbow extension    Wrist flexion    Wrist extension    Wrist ulnar deviation    Wrist radial deviation    Wrist pronation    Wrist supination    (Blank rows = not tested)  HAND FUNCTION: No active grasp, release.  Hand in flexed position with tone impacting ability to open or extend fingers.  COORDINATION: No active grasp, release.  Hand in flexed position with tone impacting ability to open or extend fingers.  SENSATION: Not tested  MUSCLE TONE: LUE: Moderate and Hypertonic  COGNITION: Overall cognitive status: Within  functional limits for tasks assessed  VISION: Subjective report: no issues Baseline vision: No visual deficits  VISION ASSESSMENT: Not tested  OBSERVATIONS: Pt ambulating to/from treatment space with single point cane at Mod I level.  Pt very pleasant and motivated to regain use of LUE and to be able to get in/out of pool to allow for further exercise.   TODAY'S TREATMENT:                                                Educated on return to aquatic therapy and focus of activities in clinic  PATIENT EDUCATION: Education details:  Educated on role and purpose of OT as well as potential interventions and goals for therapy based on initial evaluation findings. Person educated: Patient Education method: Explanation Education comprehension: verbalized understanding  HOME EXERCISE PROGRAM: TBD   GOALS: Goals reviewed with patient? No  SHORT TERM GOALS: Target date: 05/17/22  Pt will be independent with updated HEP. Baseline: Goal status: INITIAL  2.  Patient will incorporate self stretching techniques throughout the day whether at home or work   Baseline:  Goal status: INITIAL  3.  Pt will demonstrate A/ROM wrist extension to -10 for improved functional use.  Baseline: -32* Goal status: INITIAL  4.  Pt will verbalize understanding of task modifications and/or potential AE needs to increase ease, safety, and independence w/ ADLs (rocker knife, adaptive cutting board, etc.) Baseline:  Goal status: INITIAL   LONG TERM GOALS: Target date: 06/14/22  Pt will use LUE as a stabilizer/gross assist at least 50% of the time.  Baseline:  Goal status: INITIAL  2.  Pt will demonstrate ability to actively release an item than has been placed in left hand 50% of the time.  Baseline:  Goal status: INITIAL  3.  Pt will demonstrate at least 80% A/ROM foream supination  following stretching in prep for improved functional use.   Baseline: Goal status: INITIAL  4. Patient will reach to  hit target at at least 120 degrees of shoulder flexion while upright with min compensatory movement/strategies.  Baseline:  Goal status: INITIAL  5.  Patient will engage in aquatic therapy level exercises to increase independence with leisure pursuits of accessing pool for self-guided exercises.  Baseline:  Goal status: INITIAL   ASSESSMENT:  CLINICAL IMPRESSION: Patient is a 41 y.o. male who was seen today for occupational therapy evaluation for resuming OT services s/p botox and to pursue aquatic therapy. Pt is overall Mod I with ADLs and IADLs, utilizing LUE as stabilizer to gross assist 30% of time.  Pt lives with spouse and works in [employer] from home.  Pt will benefit from skilled occupational therapy services to address strength and coordination, ROM, GMC, altered sensation,  safety awareness, introduction of compensatory strategies/AE prn, and implementation of an HEP to improve participation and safety during ADLs and IADLs.   PERFORMANCE DEFICITS: in functional skills including ADLs, IADLs, dexterity, tone, ROM, strength, flexibility, Gross motor control, body mechanics, decreased knowledge of use of DME, and UE functional use and psychosocial skills including environmental adaptation.   IMPAIRMENTS: are limiting patient from ADLs, IADLs, and leisure.   CO-MORBIDITIES: may have co-morbidities  that affects occupational performance. Patient will benefit from skilled OT to address above impairments and improve overall function.  MODIFICATION OR ASSISTANCE TO COMPLETE EVALUATION: Min-Moderate modification of tasks or assist with assess necessary to complete an evaluation.  OT OCCUPATIONAL PROFILE AND HISTORY: Problem focused assessment: Including review of records relating to presenting problem.  CLINICAL DECISION MAKING: Moderate - several treatment options, min-mod task modification necessary  REHAB POTENTIAL: Good  EVALUATION COMPLEXITY: Low    PLAN:  OT FREQUENCY:  1-2x/week  OT DURATION: 8 weeks  PLANNED INTERVENTIONS: self care/ADL training, therapeutic exercise, therapeutic activity, neuromuscular re-education, manual therapy, passive range of motion, aquatic therapy, electrical stimulation, ultrasound, compression bandaging, moist heat, cryotherapy, patient/family education, energy conservation, coping strategies training, and DME and/or AE instructions  RECOMMENDED OTHER SERVICES: aquatic therapy  CONSULTED AND AGREED WITH PLAN OF CARE: Patient  PLAN FOR NEXT SESSION: WB through LUE to include tall kneeling and quadruped, PROM  at shoulder, elbow, wrist.  Continue to f/u regarding scheduling for aquatic with Antony Salmon.   Simonne Come, OTR/L 04/19/2022, 1:08 PM

## 2022-04-25 ENCOUNTER — Ambulatory Visit: Payer: Managed Care, Other (non HMO) | Admitting: Occupational Therapy

## 2022-04-29 ENCOUNTER — Ambulatory Visit: Payer: Managed Care, Other (non HMO) | Admitting: Occupational Therapy

## 2022-04-29 ENCOUNTER — Encounter: Payer: Self-pay | Admitting: Occupational Therapy

## 2022-04-29 DIAGNOSIS — R2681 Unsteadiness on feet: Secondary | ICD-10-CM

## 2022-04-29 DIAGNOSIS — R208 Other disturbances of skin sensation: Secondary | ICD-10-CM

## 2022-04-29 DIAGNOSIS — R29818 Other symptoms and signs involving the nervous system: Secondary | ICD-10-CM

## 2022-04-29 DIAGNOSIS — I69354 Hemiplegia and hemiparesis following cerebral infarction affecting left non-dominant side: Secondary | ICD-10-CM

## 2022-04-29 DIAGNOSIS — M6281 Muscle weakness (generalized): Secondary | ICD-10-CM

## 2022-04-29 NOTE — Therapy (Signed)
OUTPATIENT OCCUPATIONAL THERAPY TREATMENT NOTE   Patient Name: Dennis Mercado MRN: [id number] DOB:04-Feb-1981, 41 y.o., male Today's Date: 04/29/2022  PCP: Libby Maw, MD  REFERRING PROVIDER: Alysia Penna   END OF SESSION:   OT End of Session - 04/29/22 1747     Visit Number 2    Number of Visits 17    Date for OT Re-Evaluation 06/14/22    Authorization Type [employer]    Authorization Time Period per patient 30 visit OT limit    OT Start Time 1400    OT Stop Time 1500    OT Time Calculation (min) 60 min    Activity Tolerance Patient tolerated treatment well    Behavior During Therapy [facility] for tasks assessed/performed             History reviewed. No pertinent past medical history. Past Surgical History:  Procedure Laterality Date   WISDOM TOOTH EXTRACTION     Patient Active Problem List   Diagnosis Date Noted   Screening for prostate cancer 12/20/2021   Grieving 12/20/2021   Prediabetes 06/27/2021   Elevated glucose 10/13/2020   Mixed hyperlipidemia 10/05/2020   History of CVA (cerebrovascular accident) 01/19/2020   Normocytic anemia    Benign essential HTN    Hyponatremia    Acute blood loss anemia    Hypotension due to drugs    Vascular headache    Prerenal azotemia    Sleep disturbance    Leukocytosis    Transaminitis    Marijuana abuse    Essential hypertension    Dysphagia, post-stroke    Left hemiparesis (HCC)    Acute respiratory failure (HCC)    ICH (intracerebral hemorrhage) ([place]) 12/06/2019    ONSET DATE: 12/06/19 (recent referral: 04/09/22)   REFERRING DIAG: G81.14 (ICD-10-CM) - Left spastic hemiplegia   THERAPY DIAG:  Spastic hemiplegia of left nondominant side as late effect of cerebral infarction ([place])  Hemiplegia and hemiparesis following cerebral infarction affecting left non-dominant side (HCC)  Muscle weakness (generalized)  Unsteadiness on feet  Other symptoms and signs involving the nervous system  Other  disturbances of skin sensation  Rationale for Evaluation and Treatment: Rehabilitation  PERTINENT HISTORY: Pt. presented to ED on 12/06/2019 with left side weakness and altered mental status. 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 Received inpatient rehab; was discharged 01/12/20. Pt returns to OT after botox on 02/05/22 by Dr. Letta Pate; HTN, left inattention, apraxia    PRECAUTIONS: Fall  SUBJECTIVE: I have tried therapy all over to see what else is out there.  I go see Dr Letta Pate tomorrow (for spasticity management)  PAIN:  Are you having pain? No   OBJECTIVE:   Today's Treatment:   04/29/22 Patient seen for aquatic therapy visit.  Patient well known to this clinician from prior clinic and pool therapy visits.  Patient interested in developing a safe and effective aquatic exercise program which he can do independently.  Patient has recently joined wellness center.   Patient walked on pool deck with water shoes and cane.  Patient able to set cane down at edge of pool and enter pool via stairs and one railing.  Patient able to walk in water waist to chest deep with supervision. Patient with wide base of support with walking, and reduced terminal stance on left leg.  Patient able to narrow base and take longer steps with cueing.   Worked on Forensic scientist, [employer] exercises, and  movement in LUE with decreased effort.  Cueing and facilitation to reduce overactive neck muscles.   Worked in tall kneeling and modified plantigrade to increase both stretch to Left shoulder and also to increase weight bearing thru left arm.  Patient eager for HEP he can complete in pool.    GOALS: Goals reviewed with patient? No   SHORT TERM GOALS: Target date: 05/17/22   Pt will be independent with updated HEP. Baseline: Goal status: INITIAL   2.  Patient will incorporate self stretching techniques throughout the day  whether at home or work              Baseline:             Goal status: INITIAL   3.  Pt will demonstrate A/ROM wrist extension to -10 for improved functional use.  Baseline: -32* Goal status: INITIAL   4.  Pt will verbalize understanding of task modifications and/or potential AE needs to increase ease, safety, and independence w/ ADLs (rocker knife, adaptive cutting board, etc.) Baseline:  Goal status: INITIAL     LONG TERM GOALS: Target date: 06/14/22   Pt will use LUE as a stabilizer/gross assist at least 50% of the time.  Baseline:  Goal status: INITIAL   2.  Pt will demonstrate ability to actively release an item than has been placed in left hand 50% of the time.  Baseline:  Goal status: INITIAL   3.  Pt will demonstrate at least 80% A/ROM foream supination  following stretching in prep for improved functional use.              Baseline: Goal status: INITIAL   4. Patient will reach to hit target at at least 120 degrees of shoulder flexion while upright with min compensatory movement/strategies.             Baseline:             Goal status: INITIAL   5.  Patient will engage in aquatic therapy level exercises to increase independence with leisure pursuits of accessing pool for self-guided exercises.  Baseline:  Goal status: INITIAL     ASSESSMENT:   CLINICAL IMPRESSION: Patient is a 41 y.o. male who was seen today for occupational therapy evaluation for resuming OT services s/p botox and to pursue aquatic therapy. Pt is overall Mod I with ADLs and IADLs, utilizing LUE as stabilizer to gross assist 30% of time.  Pt lives with spouse and works in [employer] from home.  Pt will benefit from skilled occupational therapy services to address strength and coordination, ROM, GMC, altered sensation,  safety awareness, introduction of compensatory strategies/AE prn, and implementation of an HEP to improve participation and safety during ADLs and IADLs.    PERFORMANCE DEFICITS:  in functional skills including ADLs, IADLs, dexterity, tone, ROM, strength, flexibility, Gross motor control, body mechanics, decreased knowledge of use of DME, and UE functional use and psychosocial skills including environmental adaptation.    IMPAIRMENTS: are limiting patient from ADLs, IADLs, and leisure.    CO-MORBIDITIES: may have co-morbidities  that affects occupational performance. Patient will benefit from skilled OT to address above impairments and improve overall function.   MODIFICATION OR ASSISTANCE TO COMPLETE EVALUATION: Min-Moderate modification of tasks or assist with assess necessary to complete an evaluation.   OT OCCUPATIONAL PROFILE AND HISTORY: Problem focused assessment: Including review of records relating to presenting problem.   CLINICAL DECISION MAKING: Moderate - several treatment options, min-mod task modification necessary  REHAB POTENTIAL: Good   EVALUATION COMPLEXITY: Low       PLAN:   OT FREQUENCY: 1-2x/week   OT DURATION: 8 weeks   PLANNED INTERVENTIONS: self care/ADL training, therapeutic exercise, therapeutic activity, neuromuscular re-education, manual therapy, passive range of motion, aquatic therapy, electrical stimulation, ultrasound, compression bandaging, moist heat, cryotherapy, patient/family education, energy conservation, coping strategies training, and DME and/or AE instructions   RECOMMENDED OTHER SERVICES: aquatic therapy   CONSULTED AND AGREED WITH PLAN OF CARE: Patient   PLAN FOR NEXT SESSION: WB through LUE to include tall kneeling and quadruped, PROM at shoulder, elbow, wrist.  Continue to f/u regarding scheduling for aquatic with Antony Salmon.     Mariah Milling, OT 04/29/2022, 5:49 PM

## 2022-04-30 ENCOUNTER — Encounter: Payer: Self-pay | Admitting: Physical Medicine & Rehabilitation

## 2022-04-30 ENCOUNTER — Encounter
Payer: Managed Care, Other (non HMO) | Attending: Physical Medicine & Rehabilitation | Admitting: Physical Medicine & Rehabilitation

## 2022-04-30 VITALS — BP 133/86 | Ht 67.0 in | Wt 159.6 lb

## 2022-04-30 DIAGNOSIS — G8114 Spastic hemiplegia affecting left nondominant side: Secondary | ICD-10-CM | POA: Diagnosis present

## 2022-04-30 MED ORDER — ABOBOTULINUMTOXINA 500 UNITS IM SOLR
1500.0000 [IU] | Freq: Once | INTRAMUSCULAR | Status: AC
  Administered 2022-04-30: 1500 [IU] via INTRAMUSCULAR

## 2022-04-30 NOTE — Progress Notes (Signed)
Dysport Injection for spasticity using needle EMG guidance  Dilution: 200 Units/ml Indication: Severe spasticity which interferes with ADL,mobility and/or  hygiene and is unresponsive to medication management and other conservative care Informed consent was obtained after describing risks and benefits of the procedure with the patient. This includes bleeding, bruising, infection, excessive weakness, or medication side effects. A REMS form is on file and signed. Needle:  needle electrode Number of units per muscle Dysport  LEFT FCR 200 FDS 200 FDP 200 Biceps 500 BR 200 FPL 100 FCU 100 All injections were done after obtaining appropriate EMG activity and after negative drawback for blood. The patient tolerated the procedure well. Post procedure instructions were given. A followup appointment was made.

## 2022-04-30 NOTE — Patient Instructions (Signed)
We can switch out toxins if needed from an insurance standpoint

## 2022-05-02 ENCOUNTER — Ambulatory Visit: Payer: Managed Care, Other (non HMO) | Admitting: Occupational Therapy

## 2022-05-02 DIAGNOSIS — M6281 Muscle weakness (generalized): Secondary | ICD-10-CM

## 2022-05-02 DIAGNOSIS — I69354 Hemiplegia and hemiparesis following cerebral infarction affecting left non-dominant side: Secondary | ICD-10-CM

## 2022-05-02 DIAGNOSIS — R2681 Unsteadiness on feet: Secondary | ICD-10-CM

## 2022-05-02 NOTE — Therapy (Signed)
OUTPATIENT OCCUPATIONAL THERAPY TREATMENT NOTE   Patient Name: Dennis Mercado MRN: [id number] DOB:01-Nov-1980, 41 y.o., male Today's Date: 05/02/2022  PCP: Libby Maw, MD  REFERRING PROVIDER: Alysia Penna   END OF SESSION:   OT End of Session - 05/02/22 0853     Visit Number 3    Number of Visits 17    Date for OT Re-Evaluation 06/14/22    Authorization Type [employer]    Authorization Time Period per patient 30 visit OT limit    OT Start Time 0850    OT Stop Time 0930    OT Time Calculation (min) 40 min    Activity Tolerance Patient tolerated treatment well    Behavior During Therapy [facility] for tasks assessed/performed              No past medical history on file. Past Surgical History:  Procedure Laterality Date   WISDOM TOOTH EXTRACTION     Patient Active Problem List   Diagnosis Date Noted   Screening for prostate cancer 12/20/2021   Grieving 12/20/2021   Prediabetes 06/27/2021   Elevated glucose 10/13/2020   Mixed hyperlipidemia 10/05/2020   History of CVA (cerebrovascular accident) 01/19/2020   Normocytic anemia    Benign essential HTN    Hyponatremia    Acute blood loss anemia    Hypotension due to drugs    Vascular headache    Prerenal azotemia    Sleep disturbance    Leukocytosis    Transaminitis    Marijuana abuse    Essential hypertension    Dysphagia, post-stroke    Left hemiparesis (HCC)    Acute respiratory failure (HCC)    ICH (intracerebral hemorrhage) ([place]) 12/06/2019    ONSET DATE: 12/06/19 (recent referral: 04/09/22)   REFERRING DIAG: G81.14 (ICD-10-CM) - Left spastic hemiplegia   THERAPY DIAG:  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 presented to ED on 12/06/2019 with left side weakness and altered mental status. 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 Received inpatient rehab; was discharged 01/12/20. Pt returns to OT after botox on 02/05/22 by Dr. Letta Pate; HTN, left inattention, apraxia    PRECAUTIONS: Fall  SUBJECTIVE: I have tried therapy all over to see what else is out there.  I go see Dr Letta Pate tomorrow (for spasticity management)  PAIN:  Are you having pain? No   OBJECTIVE:   Today's Treatment:   05/02/22 Self-ROM: use of cane to facilitate wrist extension and finger extension.  Pt demonstrating good technique and good understanding of stretch to break up tone and facilitate increased ROM in preparation for WB. PROM: OT facilitating wrist flexion/extension and hand over hand facilitation prior to WB for increased wrist mobility to tolerate WB. WB: engaged in WB through partial opened hand over yoga block initially to facilitate increased wrist extension, progressing to over half ball to allow for increased finger extension.  Engaged in WB while in standing at elevated mat table incorporating reaching with RUE to facilitate increased WB with directional reaching.  Discussed frequency of engaging in WB during day to incorporate WB through full elbow/shoulder as well as wrist/hand. Provided pt with pool noodle cut in half to allow for larger surface area for WB at home, as pt reports utilizing small foam tubing used for utensils.  04/29/22 Patient seen for aquatic therapy visit.  Patient well known to this clinician from prior clinic and pool therapy visits.  Patient interested in developing a safe and effective aquatic exercise program which he can do independently.  Patient has recently joined wellness center.   Patient walked on pool deck with water shoes and cane.  Patient able to set cane down at edge of pool and enter pool via stairs and one railing.  Patient able to walk in water waist to chest deep with supervision.  Patient with wide base of support with walking, and reduced terminal stance on left leg.  Patient able to narrow base and take longer steps with cueing.   Worked on stand balance, Ai[employer] exercises, and movement in LUE with decreased effort.  Cueing and facilitation to reduce overactive neck muscles.   Worked in tall kneeling and modified plantigrade to increase both stretch to Left shoulder and also to increase weight bearing thru left arm.  Patient eager for HEP he can complete in pool.    GOALS: Goals reviewed with patient? Yes   SHORT TERM GOALS: Target date: 05/17/22   Pt will be independent with updated HEP. Baseline: Goal status: On going   2.  Patient will incorporate self stretching techniques throughout the day whether at home or work              Baseline:             Goal status: On going   3.  Pt will demonstrate A/ROM wrist extension to -10 for improved functional use.  Baseline: -32* Goal status: On going   4.  Pt will verbalize understanding of task modifications and/or potential AE needs to increase ease, safety, and independence w/ ADLs (rocker knife, adaptive cutting board, etc.) Baseline:  Goal status: On going     LONG TERM GOALS: Target date: 06/14/22   Pt will use LUE as a stabilizer/gross assist at least 50% of the time.  Baseline:  Goal status: On going   2.  Pt will demonstrate ability to actively release an item than has been placed in left hand 50% of the time.  Baseline:  Goal status: On going   3.  Pt will demonstrate at least 80% A/ROM foream supination  following stretching in prep for improved functional use.              Baseline: Goal status: On going   4. Patient will reach to hit target at at least 120 degrees of shoulder flexion while upright with min compensatory movement/strategies.             Baseline:             Goal status: On going   5.  Patient will engage in aquatic therapy level exercises to increase independence with leisure  pursuits of accessing pool for self-guided exercises.  Baseline:  Goal status: On going     ASSESSMENT:   CLINICAL IMPRESSION: Pt received for first in clinic treatment session.  Pt reports pleased with ability to resume aquatic therapy and is hopeful for increased functional use of LUE s/p botox injections this week.  Pt demonstrating good understanding of previously educated stretches and mobility in preparation for WB.  Pt will continue to benefit from ROM and WB as tolerated.   PERFORMANCE DEFICITS: in functional skills including ADLs, IADLs, dexterity, tone, ROM, strength, flexibility, Gross motor control, body mechanics, decreased knowledge of use of DME, and UE functional use and  psychosocial skills including environmental adaptation.    IMPAIRMENTS: are limiting patient from ADLs, IADLs, and leisure.    CO-MORBIDITIES: may have co-morbidities  that affects occupational performance. Patient will benefit from skilled OT to address above impairments and improve overall function.   MODIFICATION OR ASSISTANCE TO COMPLETE EVALUATION: Min-Moderate modification of tasks or assist with assess necessary to complete an evaluation.   OT OCCUPATIONAL PROFILE AND HISTORY: Problem focused assessment: Including review of records relating to presenting problem.   CLINICAL DECISION MAKING: Moderate - several treatment options, min-mod task modification necessary   REHAB POTENTIAL: Good   EVALUATION COMPLEXITY: Low       PLAN:   OT FREQUENCY: 1-2x/week   OT DURATION: 8 weeks   PLANNED INTERVENTIONS: self care/ADL training, therapeutic exercise, therapeutic activity, neuromuscular re-education, manual therapy, passive range of motion, aquatic therapy, electrical stimulation, ultrasound, compression bandaging, moist heat, cryotherapy, patient/family education, energy conservation, coping strategies training, and DME and/or AE instructions   RECOMMENDED OTHER SERVICES: aquatic therapy    CONSULTED AND AGREED WITH PLAN OF CARE: Patient   PLAN FOR NEXT SESSION: WB through LUE to include tall kneeling and quadruped, PROM at shoulder, elbow, wrist.  Continue to f/u regarding scheduling for aquatic with Antony Salmon.     Simonne Come, [place] 05/02/2022, 12:34 PM

## 2022-05-06 ENCOUNTER — Encounter: Payer: Self-pay | Admitting: Occupational Therapy

## 2022-05-06 ENCOUNTER — Ambulatory Visit: Payer: Managed Care, Other (non HMO) | Admitting: Occupational Therapy

## 2022-05-06 DIAGNOSIS — R2681 Unsteadiness on feet: Secondary | ICD-10-CM

## 2022-05-06 DIAGNOSIS — I69354 Hemiplegia and hemiparesis following cerebral infarction affecting left non-dominant side: Secondary | ICD-10-CM

## 2022-05-06 DIAGNOSIS — M6281 Muscle weakness (generalized): Secondary | ICD-10-CM

## 2022-05-06 DIAGNOSIS — R208 Other disturbances of skin sensation: Secondary | ICD-10-CM

## 2022-05-06 NOTE — Therapy (Signed)
OUTPATIENT OCCUPATIONAL THERAPY TREATMENT NOTE   Patient Name: Dennis Mercado MRN: [id number] DOB:[birth year], 41 y.o., male Today's Date: 05/06/2022  PCP: Libby Maw, MD  REFERRING PROVIDER: Alysia Penna   END OF SESSION:   OT End of Session - 05/06/22 1627     Visit Number 4    Number of Visits 17    Authorization Type [employer]    Authorization Time Period per patient 30 visit OT limit    OT Start Time 1413    OT Stop Time 1507    OT Time Calculation (min) 54 min    Activity Tolerance Patient tolerated treatment well    Behavior During Therapy [facility] for tasks assessed/performed              History reviewed. No pertinent past medical history. Past Surgical History:  Procedure Laterality Date   WISDOM TOOTH EXTRACTION     Patient Active Problem List   Diagnosis Date Noted   Screening for prostate cancer 12/20/2021   Grieving 12/20/2021   Prediabetes 06/27/2021   Elevated glucose 10/13/2020   Mixed hyperlipidemia 10/05/2020   History of CVA (cerebrovascular accident) 01/19/2020   Normocytic anemia    Benign essential HTN    Hyponatremia    Acute blood loss anemia    Hypotension due to drugs    Vascular headache    Prerenal azotemia    Sleep disturbance    Leukocytosis    Transaminitis    Marijuana abuse    Essential hypertension    Dysphagia, post-stroke    Left hemiparesis (HCC)    Acute respiratory failure (HCC)    ICH (intracerebral hemorrhage) ([place]) 12/06/2019    ONSET DATE: 12/06/19 (recent referral: 04/09/22)   REFERRING DIAG: G81.14 (ICD-10-CM) - Left spastic hemiplegia   THERAPY DIAG:  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 presented to ED on 12/06/2019 with left side weakness and altered mental status.  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 Received inpatient rehab; was discharged 01/12/20. Pt returns to OT after botox on 02/05/22 by Dr. Letta Pate; HTN, left inattention, apraxia    PRECAUTIONS: Fall  SUBJECTIVE: I have tried therapy all over to see what else is out there.   PAIN:  Are you having pain? No   OBJECTIVE:   Today's Treatment:     05/06/22: Patient seen for aquatic therapy visit.  Patient entered and exited the pool via stairs and one hand rail on right (descending/ascending) Treatment occurred in 3.5-4.5 ft of warm water.   Warm up water walking forward, backward, side stepping.  Worked to increase elbow extension, and arm at side with walking in water.  Worked to improve coordination of UE/LE with trunk.  Worked to decrease trunk and pelvic rotation with stance phase LLE. Wall stretch to improve left hip extension.  Worked on coordination of left leg with trunk to isolate hip motion toward extesnion - stretch hip flexors Stepping up and down 3" step - difficulty descending with LUE on stair.  Worked on eccentric control for descent with forward weight shift.   Worked on active movement of left arm - using buoyancy to reduce effort or shoulder flexion.  Worked to realign superior subluxation and provide traction to [facility] joint to promote more natural  mechanics of shoulder movement.  Provided stretch to elbow toward extension, forerarm supination/pronation and wrist extension.  Patient reports he is able to feel aspects of stretching.   Anticipate seeing patient for aquatic therapy through 12/18 as needed to establish an aquatic exercise program/    05/02/22 Self-ROM: use of cane to facilitate wrist extension and finger extension.  Pt demonstrating good technique and good understanding of stretch to break up tone and facilitate increased ROM in preparation for WB. PROM: OT facilitating wrist  flexion/extension and hand over hand facilitation prior to WB for increased wrist mobility to tolerate WB. WB: engaged in WB through partial opened hand over yoga block initially to facilitate increased wrist extension, progressing to over half ball to allow for increased finger extension.  Engaged in WB while in standing at elevated mat table incorporating reaching with RUE to facilitate increased WB with directional reaching.  Discussed frequency of engaging in WB during day to incorporate WB through full elbow/shoulder as well as wrist/hand. Provided pt with pool noodle cut in half to allow for larger surface area for WB at home, as pt reports utilizing small foam tubing used for utensils.   GOALS: Goals reviewed with patient? Yes   SHORT TERM GOALS: Target date: 05/17/22   Pt will be independent with updated HEP. Baseline: Goal status: On going   2.  Patient will incorporate self stretching techniques throughout the day whether at home or work              Baseline:             Goal status: On going   3.  Pt will demonstrate A/ROM wrist extension to -10 for improved functional use.  Baseline: -32* Goal status: On going   4.  Pt will verbalize understanding of task modifications and/or potential AE needs to increase ease, safety, and independence w/ ADLs (rocker knife, adaptive cutting board, etc.) Baseline:  Goal status: On going     LONG TERM GOALS: Target date: 06/14/22   Pt will use LUE as a stabilizer/gross assist at least 50% of the time.  Baseline:  Goal status: On going   2.  Pt will demonstrate ability to actively release an item than has been placed in left hand 50% of the time.  Baseline:  Goal status: On going   3.  Pt will demonstrate at least 80% A/ROM foream supination  following stretching in prep for improved functional use.              Baseline: Goal status: On going   4. Patient will reach to hit target at at least 120 degrees of shoulder flexion while  upright with min compensatory movement/strategies.             Baseline:             Goal status: On going   5.  Patient will engage in aquatic therapy level exercises to increase independence with leisure pursuits of accessing pool for self-guided exercises.  Baseline:  Goal status: On going     ASSESSMENT:   CLINICAL IMPRESSION: Patient received Dysport injection last week and reports feeling slightly less tension in left arm/wrist.  Patient is eager to develop an HEP for the pool - and will work to develop a safe and effective program designed to help him maintain and improve balance, range of motion in LLE/LUE, and overall functional mobility.     PERFORMANCE DEFICITS: in functional skills including ADLs, IADLs, dexterity,  tone, ROM, strength, flexibility, Gross motor control, body mechanics, decreased knowledge of use of DME, and UE functional use and psychosocial skills including environmental adaptation.    IMPAIRMENTS: are limiting patient from ADLs, IADLs, and leisure.    CO-MORBIDITIES: may have co-morbidities  that affects occupational performance. Patient will benefit from skilled OT to address above impairments and improve overall function.   MODIFICATION OR ASSISTANCE TO COMPLETE EVALUATION: Min-Moderate modification of tasks or assist with assess necessary to complete an evaluation.   OT OCCUPATIONAL PROFILE AND HISTORY: Problem focused assessment: Including review of records relating to presenting problem.   CLINICAL DECISION MAKING: Moderate - several treatment options, min-mod task modification necessary   REHAB POTENTIAL: Good   EVALUATION COMPLEXITY: Low       PLAN:   OT FREQUENCY: 1-2x/week   OT DURATION: 8 weeks   PLANNED INTERVENTIONS: self care/ADL training, therapeutic exercise, therapeutic activity, neuromuscular re-education, manual therapy, passive range of motion, aquatic therapy, electrical stimulation, ultrasound, compression bandaging, moist heat,  cryotherapy, patient/family education, energy conservation, coping strategies training, and DME and/or AE instructions   RECOMMENDED OTHER SERVICES: aquatic therapy   CONSULTED AND AGREED WITH PLAN OF CARE: Patient   PLAN FOR NEXT SESSION: WB through LUE to include tall kneeling and quadruped, PROM at shoulder, elbow, wrist.  Continue to f/u regarding scheduling for aquatic with Antony Salmon.     Mariah Milling, OT 05/06/2022, 4:41 PM

## 2022-05-07 ENCOUNTER — Ambulatory Visit: Payer: Managed Care, Other (non HMO) | Admitting: Occupational Therapy

## 2022-05-07 DIAGNOSIS — I69354 Hemiplegia and hemiparesis following cerebral infarction affecting left non-dominant side: Secondary | ICD-10-CM

## 2022-05-07 DIAGNOSIS — M6281 Muscle weakness (generalized): Secondary | ICD-10-CM

## 2022-05-07 DIAGNOSIS — R2681 Unsteadiness on feet: Secondary | ICD-10-CM

## 2022-05-07 NOTE — Therapy (Signed)
OUTPATIENT OCCUPATIONAL THERAPY TREATMENT NOTE   Patient Name: Dennis Mercado MRN: [id number] DOB:10/27/1980, 41 y.o., male Today's Date: 05/07/2022  PCP: Libby Maw, MD  REFERRING PROVIDER: Alysia Penna   END OF SESSION:   OT End of Session - 05/07/22 1660     Visit Number 5    Number of Visits 17    Authorization Type [employer]    Authorization Time Period per patient 30 visit OT limit    OT Start Time 0809    OT Stop Time 0847    OT Time Calculation (min) 38 min    Activity Tolerance Patient tolerated treatment well    Behavior During Therapy [facility] for tasks assessed/performed              No past medical history on file. Past Surgical History:  Procedure Laterality Date   WISDOM TOOTH EXTRACTION     Patient Active Problem List   Diagnosis Date Noted   Screening for prostate cancer 12/20/2021   Grieving 12/20/2021   Prediabetes 06/27/2021   Elevated glucose 10/13/2020   Mixed hyperlipidemia 10/05/2020   History of CVA (cerebrovascular accident) 01/19/2020   Normocytic anemia    Benign essential HTN    Hyponatremia    Acute blood loss anemia    Hypotension due to drugs    Vascular headache    Prerenal azotemia    Sleep disturbance    Leukocytosis    Transaminitis    Marijuana abuse    Essential hypertension    Dysphagia, post-stroke    Left hemiparesis (HCC)    Acute respiratory failure (HCC)    ICH (intracerebral hemorrhage) ([place]) 12/06/2019    ONSET DATE: 12/06/19 (recent referral: 04/09/22)   REFERRING DIAG: G81.14 (ICD-10-CM) - Left spastic hemiplegia   THERAPY DIAG:  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 presented to ED on 12/06/2019 with left side weakness and altered mental status. Cranial CT scan showed acute right thalamic hemorrhage extending  into the ventricles, with local mass effect without midline shift. Received inpatient rehab; was discharged 01/12/20. Pt returns to OT after botox on 02/05/22 by Dr. Letta Pate; HTN, left inattention, apraxia    PRECAUTIONS: Fall  SUBJECTIVE: The straps on this brace are too short. PAIN:  Are you having pain? No   OBJECTIVE:   Today's Treatment:    05/07/22: Splinting: therapist cut and provided with strapping for custom brace for increased fit.  OT noted some gapping at wrist, discussed possibility of adding additional strapping at wrist, however pt declined.  Pt reports with increased stretching the brace tends to fit better than it is this am. PROM: OT providing PROM at wrist into flexion/extension and ulnar/radial deviation.  Noted fingers not as stiff as previous clinic session.  Pt continues with self-ROM at wrist and elbow while therapist cutting strapping for brace. Exercise: pt asking questions about use of Nustep and positioning of LUE.  Pt transferred on/off Nustep Mod I, demonstrating good understanding of hemi-technique with adjusting seat position and arm handles.  Pt completed 3-4 mins on Nustep with focus on appropriate LUE placement, OT providing min cues for attention to posture and positioning of UE in end ranges.   Pt reports also utilizing weight stack for stretches.  Pt demonstrating typical routine, OT providing min cues for positioning and cues to attend to shoulder stability during exercises.  05/06/22: Patient seen for aquatic therapy visit.  Patient entered and exited the pool via stairs and one hand rail on right (descending/ascending) Treatment occurred in 3.5-4.5 ft of warm water.   Warm up water walking forward, backward, side stepping.  Worked to increase elbow extension, and arm at side with walking in water.  Worked to improve coordination of UE/LE with trunk.  Worked to decrease trunk and pelvic rotation with stance phase LLE. Wall stretch to improve left hip  extension.  Worked on coordination of left leg with trunk to isolate hip motion toward extesnion - stretch hip flexors Stepping up and down 3" step - difficulty descending with LUE on stair.  Worked on eccentric control for descent with forward weight shift.   Worked on active movement of left arm - using buoyancy to reduce effort or shoulder flexion.  Worked to realign superior subluxation and provide traction to [facility] joint to promote more natural mechanics of shoulder movement.  Provided stretch to elbow toward extension, forerarm supination/pronation and wrist extension.  Patient reports he is able to feel aspects of stretching.   Anticipate seeing patient for aquatic therapy through 12/18 as needed to establish an aquatic exercise program/    05/02/22 Self-ROM: use of cane to facilitate wrist extension and finger extension.  Pt demonstrating good technique and good understanding of stretch to break up tone and facilitate increased ROM in preparation for WB. PROM: OT facilitating wrist flexion/extension and hand over hand facilitation prior to WB for increased wrist mobility to tolerate WB. WB: engaged in WB through partial opened hand over yoga block initially to facilitate increased wrist extension, progressing to over half ball to allow for increased finger extension.  Engaged in WB while in standing at elevated mat table incorporating reaching with RUE to facilitate increased WB with directional reaching.  Discussed frequency of engaging in WB during day to incorporate WB through full elbow/shoulder as well as wrist/hand. Provided pt with pool noodle cut in half to allow for larger surface area for WB at home, as pt reports utilizing small foam tubing used for utensils.   GOALS: Goals reviewed with patient? Yes   SHORT TERM GOALS: Target date: 05/17/22   Pt will be independent with updated HEP. Baseline: Goal status: On going   2.  Patient will incorporate self stretching techniques  throughout the day whether at home or work              Baseline:             Goal status: On going   3.  Pt will demonstrate A/ROM wrist extension to -10 for improved functional use.  Baseline: -32* Goal status: On going   4.  Pt will verbalize understanding of task modifications and/or potential AE needs to increase ease, safety, and independence w/ ADLs (rocker knife, adaptive cutting board, etc.) Baseline:  Goal status: On going     LONG TERM GOALS: Target date: 06/14/22   Pt will use LUE as a stabilizer/gross assist at least 50% of the time.  Baseline:  Goal status: On going   2.  Pt will demonstrate ability to actively release an item than has been placed in left hand 50% of the time.  Baseline:  Goal status: On going   3.  Pt will demonstrate at least 80% A/ROM foream supination  following stretching in prep for improved functional use.              Baseline: Goal status: On  going   4. Patient will reach to hit target at at least 120 degrees of shoulder flexion while upright with min compensatory movement/strategies.             Baseline:             Goal status: On going   5.  Patient will engage in aquatic therapy level exercises to increase independence with leisure pursuits of accessing pool for self-guided exercises.  Baseline:  Goal status: On going     ASSESSMENT:   CLINICAL IMPRESSION: Patient received Dysport injection last week and reports feeling slightly less tension in left arm/wrist and fingers.  Pt asking questions about safe positioning and use of gym equipment as he goes to [place] 2x/week.  Pt demonstrating good intuition, min cues for attention to shoulder positioning and hand placement during use of Nustep and weight stack.     PERFORMANCE DEFICITS: in functional skills including ADLs, IADLs, dexterity, tone, ROM, strength, flexibility, Gross motor control, body mechanics, decreased knowledge of use of DME, and UE functional use and psychosocial  skills including environmental adaptation.    IMPAIRMENTS: are limiting patient from ADLs, IADLs, and leisure.    CO-MORBIDITIES: may have co-morbidities  that affects occupational performance. Patient will benefit from skilled OT to address above impairments and improve overall function.   MODIFICATION OR ASSISTANCE TO COMPLETE EVALUATION: Min-Moderate modification of tasks or assist with assess necessary to complete an evaluation.   OT OCCUPATIONAL PROFILE AND HISTORY: Problem focused assessment: Including review of records relating to presenting problem.   CLINICAL DECISION MAKING: Moderate - several treatment options, min-mod task modification necessary   REHAB POTENTIAL: Good   EVALUATION COMPLEXITY: Low       PLAN:   OT FREQUENCY: 1-2x/week   OT DURATION: 8 weeks   PLANNED INTERVENTIONS: self care/ADL training, therapeutic exercise, therapeutic activity, neuromuscular re-education, manual therapy, passive range of motion, aquatic therapy, electrical stimulation, ultrasound, compression bandaging, moist heat, cryotherapy, patient/family education, energy conservation, coping strategies training, and DME and/or AE instructions   RECOMMENDED OTHER SERVICES: aquatic therapy   CONSULTED AND AGREED WITH PLAN OF CARE: Patient   PLAN FOR NEXT SESSION: WB through LUE to include tall kneeling and quadruped, PROM at shoulder, elbow, wrist.  Initiate both gym and home HEP.  Continue to engage in aquatic with Antony Salmon.     Simonne Come, [place] 05/07/2022, 8:29 AM

## 2022-05-13 ENCOUNTER — Encounter: Payer: Self-pay | Admitting: Occupational Therapy

## 2022-05-13 ENCOUNTER — Ambulatory Visit: Payer: Managed Care, Other (non HMO) | Admitting: Occupational Therapy

## 2022-05-13 DIAGNOSIS — M6281 Muscle weakness (generalized): Secondary | ICD-10-CM

## 2022-05-13 DIAGNOSIS — R2681 Unsteadiness on feet: Secondary | ICD-10-CM

## 2022-05-13 DIAGNOSIS — I69354 Hemiplegia and hemiparesis following cerebral infarction affecting left non-dominant side: Secondary | ICD-10-CM | POA: Diagnosis not present

## 2022-05-13 DIAGNOSIS — R208 Other disturbances of skin sensation: Secondary | ICD-10-CM

## 2022-05-13 NOTE — Therapy (Signed)
OUTPATIENT OCCUPATIONAL THERAPY TREATMENT NOTE   Patient Name: Dennis Mercado MRN: [id number] DOB:[birth year], 41 y.o., male Today's Date: 05/07/2022  PCP: Libby Maw, MD  REFERRING PROVIDER: Alysia Penna   END OF SESSION:   OT End of Session - 05/07/22 5631     Visit Number 5    Number of Visits 17    Authorization Type [employer]    Authorization Time Period per patient 30 visit OT limit    OT Start Time 0809    OT Stop Time 0847    OT Time Calculation (min) 38 min    Activity Tolerance Patient tolerated treatment well    Behavior During Therapy [facility] for tasks assessed/performed              No past medical history on file. Past Surgical History:  Procedure Laterality Date   WISDOM TOOTH EXTRACTION     Patient Active Problem List   Diagnosis Date Noted   Screening for prostate cancer 12/20/2021   Grieving 12/20/2021   Prediabetes 06/27/2021   Elevated glucose 10/13/2020   Mixed hyperlipidemia 10/05/2020   History of CVA (cerebrovascular accident) 01/19/2020   Normocytic anemia    Benign essential HTN    Hyponatremia    Acute blood loss anemia    Hypotension due to drugs    Vascular headache    Prerenal azotemia    Sleep disturbance    Leukocytosis    Transaminitis    Marijuana abuse    Essential hypertension    Dysphagia, post-stroke    Left hemiparesis (HCC)    Acute respiratory failure (HCC)    ICH (intracerebral hemorrhage) ([place]) 12/06/2019    ONSET DATE: 12/06/19 (recent referral: 04/09/22)   REFERRING DIAG: G81.14 (ICD-10-CM) - Left spastic hemiplegia   THERAPY DIAG:  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 presented to ED on 12/06/2019 with left side weakness and altered mental status. Cranial CT scan showed acute right thalamic hemorrhage extending  into the ventricles, with local mass effect without midline shift. Received inpatient rehab; was discharged 01/12/20. Pt returns to OT after botox on 02/05/22 by Dr. Letta Pate; HTN, left inattention, apraxia    PRECAUTIONS: Fall  SUBJECTIVE: "Today was crazy at work.  Never got to tall kneeling!" PAIN:  Are you having pain? No   OBJECTIVE:   Today's Treatment:   05/13/22:    Patient seen for aquatic therapy visit.  Patient entered and exited the pool via stairs and one hand rail on right (descending/ascending) Treatment occurred in 3.5-4.5 ft of warm water.   Warm up water walking forward, backward, side stepping.  Worked to increase elbow extension, and arm at side with walking in water.  Worked to improve coordination of UE/LE with trunk.  Worked to decrease trunk and pelvic rotation with stance phase LLE. Worked with principles of buoyancy to address reach patterns with decreased effort.   Worked on modified swim programs on back and stomach with floatation belt.  Patient hopes to return to a swim exercise program and was a competent swimmer prior to stroke.  Patient started with floating on back, then progressed to kicking, use of arms, balancing core to reduce rolling left, then putting all motions together for back stroke.  Then repeated process for front crawl modification.    05/07/22: Splinting: therapist cut and provided with strapping for custom brace  for increased fit.  OT noted some gapping at wrist, discussed possibility of adding additional strapping at wrist, however pt declined.  Pt reports with increased stretching the brace tends to fit better than it is this am. PROM: OT providing PROM at wrist into flexion/extension and ulnar/radial deviation.  Noted fingers not as stiff as previous clinic session.  Pt continues with self-ROM at wrist and elbow while therapist cutting strapping for brace. Exercise: pt asking questions about use of Nustep and positioning of LUE.  Pt  transferred on/off Nustep Mod I, demonstrating good understanding of hemi-technique with adjusting seat position and arm handles.  Pt completed 3-4 mins on Nustep with focus on appropriate LUE placement, OT providing min cues for attention to posture and positioning of UE in end ranges.   Pt reports also utilizing weight stack for stretches.  Pt demonstrating typical routine, OT providing min cues for positioning and cues to attend to shoulder stability during exercises.   05/06/22: Patient seen for aquatic therapy visit.  Patient entered and exited the pool via stairs and one hand rail on right (descending/ascending) Treatment occurred in 3.5-4.5 ft of warm water.   Warm up water walking forward, backward, side stepping.  Worked to increase elbow extension, and arm at side with walking in water.  Worked to improve coordination of UE/LE with trunk.  Worked to decrease trunk and pelvic rotation with stance phase LLE. Wall stretch to improve left hip extension.  Worked on coordination of left leg with trunk to isolate hip motion toward extesnion - stretch hip flexors Stepping up and down 3" step - difficulty descending with LUE on stair.  Worked on eccentric control for descent with forward weight shift.   Worked on active movement of left arm - using buoyancy to reduce effort or shoulder flexion.  Worked to realign superior subluxation and provide traction to [facility] joint to promote more natural mechanics of shoulder movement.  Provided stretch to elbow toward extension, forerarm supination/pronation and wrist extension.  Patient reports he is able to feel aspects of stretching.   Anticipate seeing patient for aquatic therapy through 12/18 as needed to establish an aquatic exercise program/    05/02/22 Self-ROM: use of cane to facilitate wrist extension and finger extension.  Pt demonstrating good technique and good understanding of stretch to break up tone and facilitate increased ROM in preparation for  WB. PROM: OT facilitating wrist flexion/extension and hand over hand facilitation prior to WB for increased wrist mobility to tolerate WB. WB: engaged in WB through partial opened hand over yoga block initially to facilitate increased wrist extension, progressing to over half ball to allow for increased finger extension.  Engaged in WB while in standing at elevated mat table incorporating reaching with RUE to facilitate increased WB with directional reaching.  Discussed frequency of engaging in WB during day to incorporate WB through full elbow/shoulder as well as wrist/hand. Provided pt with pool noodle cut in half to allow for larger surface area for WB at home, as pt reports utilizing small foam tubing used for utensils.   GOALS: Goals reviewed with patient? Yes   SHORT TERM GOALS: Target date: 05/17/22   Pt will be independent with updated HEP. Baseline: Goal status: On going   2.  Patient will incorporate self stretching techniques throughout the day whether at home or work              Baseline:  Goal status: On going   3.  Pt will demonstrate A/ROM wrist extension to -10 for improved functional use.  Baseline: -32* Goal status: On going   4.  Pt will verbalize understanding of task modifications and/or potential AE needs to increase ease, safety, and independence w/ ADLs (rocker knife, adaptive cutting board, etc.) Baseline:  Goal status: On going     LONG TERM GOALS: Target date: 06/14/22   Pt will use LUE as a stabilizer/gross assist at least 50% of the time.  Baseline:  Goal status: On going   2.  Pt will demonstrate ability to actively release an item than has been placed in left hand 50% of the time.  Baseline:  Goal status: On going   3.  Pt will demonstrate at least 80% A/ROM foream supination  following stretching in prep for improved functional use.              Baseline: Goal status: On going   4. Patient will reach to hit target at at least 120  degrees of shoulder flexion while upright with min compensatory movement/strategies.             Baseline:             Goal status: On going   5.  Patient will engage in aquatic therapy level exercises to increase independence with leisure pursuits of accessing pool for self-guided exercises.  Baseline:  Goal status: On going     ASSESSMENT:   CLINICAL IMPRESSION: Patient continues to report decreased muscle tension in left arm - able to actively flex and extend elbow volitionally this session.   PERFORMANCE DEFICITS: in functional skills including ADLs, IADLs, dexterity, tone, ROM, strength, flexibility, Gross motor control, body mechanics, decreased knowledge of use of DME, and UE functional use and psychosocial skills including environmental adaptation.    IMPAIRMENTS: are limiting patient from ADLs, IADLs, and leisure.    CO-MORBIDITIES: may have co-morbidities  that affects occupational performance. Patient will benefit from skilled OT to address above impairments and improve overall function.   MODIFICATION OR ASSISTANCE TO COMPLETE EVALUATION: Min-Moderate modification of tasks or assist with assess necessary to complete an evaluation.   OT OCCUPATIONAL PROFILE AND HISTORY: Problem focused assessment: Including review of records relating to presenting problem.   CLINICAL DECISION MAKING: Moderate - several treatment options, min-mod task modification necessary   REHAB POTENTIAL: Good   EVALUATION COMPLEXITY: Low       PLAN:   OT FREQUENCY: 1-2x/week   OT DURATION: 8 weeks   PLANNED INTERVENTIONS: self care/ADL training, therapeutic exercise, therapeutic activity, neuromuscular re-education, manual therapy, passive range of motion, aquatic therapy, electrical stimulation, ultrasound, compression bandaging, moist heat, cryotherapy, patient/family education, energy conservation, coping strategies training, and DME and/or AE instructions   RECOMMENDED OTHER SERVICES:  aquatic therapy   CONSULTED AND AGREED WITH PLAN OF CARE: Patient   PLAN FOR NEXT SESSION: WB through LUE to include tall kneeling and quadruped, PROM at shoulder, elbow, wrist.  Initiate both gym and home HEP.  Continue to engage in aquatic with Antony Salmon.     Simonne Come, [place] 05/07/2022, 8:29 AM

## 2022-05-16 ENCOUNTER — Ambulatory Visit: Payer: Managed Care, Other (non HMO) | Admitting: Occupational Therapy

## 2022-05-16 DIAGNOSIS — I69354 Hemiplegia and hemiparesis following cerebral infarction affecting left non-dominant side: Secondary | ICD-10-CM

## 2022-05-16 DIAGNOSIS — R208 Other disturbances of skin sensation: Secondary | ICD-10-CM

## 2022-05-16 DIAGNOSIS — M6281 Muscle weakness (generalized): Secondary | ICD-10-CM

## 2022-05-16 NOTE — Therapy (Signed)
OUTPATIENT OCCUPATIONAL THERAPY TREATMENT NOTE   Patient Name: Dennis Mercado MRN: [id number] DOB:19-Sep-1980, 41 y.o., male Today's Date: 05/16/2022  PCP: Libby Maw, MD  REFERRING PROVIDER: Alysia Penna   END OF SESSION:   OT End of Session - 05/16/22 0848     Visit Number 7    Number of Visits 17    Date for OT Re-Evaluation 06/14/22    Authorization Type [employer]    Authorization Time Period per patient 30 visit OT limit    OT Start Time 0801    OT Stop Time 0845    OT Time Calculation (min) 44 min    Activity Tolerance Patient tolerated treatment well    Behavior During Therapy [facility] for tasks assessed/performed               No past medical history on file. Past Surgical History:  Procedure Laterality Date   WISDOM TOOTH EXTRACTION     Patient Active Problem List   Diagnosis Date Noted   Screening for prostate cancer 12/20/2021   Grieving 12/20/2021   Prediabetes 06/27/2021   Elevated glucose 10/13/2020   Mixed hyperlipidemia 10/05/2020   History of CVA (cerebrovascular accident) 01/19/2020   Normocytic anemia    Benign essential HTN    Hyponatremia    Acute blood loss anemia    Hypotension due to drugs    Vascular headache    Prerenal azotemia    Sleep disturbance    Leukocytosis    Transaminitis    Marijuana abuse    Essential hypertension    Dysphagia, post-stroke    Left hemiparesis (HCC)    Acute respiratory failure (HCC)    ICH (intracerebral hemorrhage) ([place]) 12/06/2019    ONSET DATE: 12/06/19 (recent referral: 04/09/22)   REFERRING DIAG: G81.14 (ICD-10-CM) - Left spastic hemiplegia   THERAPY DIAG:  Spastic hemiplegia of left nondominant side as late effect of cerebral infarction ([place])  Other disturbances of skin sensation  Muscle weakness (generalized)  Rationale for Evaluation and Treatment: Rehabilitation  PERTINENT HISTORY: Pt. presented to ED on 12/06/2019 with left side weakness and altered mental status. 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 Received inpatient rehab; was discharged 01/12/20. Pt returns to OT after botox on 02/05/22 by Dr. Letta Pate; HTN, left inattention, apraxia    PRECAUTIONS: Fall  SUBJECTIVE: "The pool is awesome! I practiced swimming." PAIN:  Are you having pain? No   OBJECTIVE:   Today's Treatment:   05/16/22 Supine AAROM: closed chain AAROM with dowel with focus on shoulder flexion, shoulder abduction, and elbow extension.  Pt completed chest press x20 and horizontal abduction x20.  Transitioned to use of cane to allow for more neutral hand placement.   Sitting AAROM: elbow extension and wrist extension with use of cane to facilitate increased ROM.  OT educated on engagement in exercises during daily, routine tasks.  Completed with forward shoulder flexion and at 45* angle to incorporate abduction as well. PROM wrist: OT completing PROM wrist flexion/extension and ulnar/radial deviation.   05/13/22:    Patient seen for aquatic therapy visit.  Patient entered and exited the pool via stairs and one hand rail on right (descending/ascending) Treatment occurred in 3.5-4.5 ft of warm water.   Warm up water walking forward, backward, side stepping.  Worked to increase elbow extension, and arm at side with walking in water.  Worked to improve coordination of UE/LE with trunk.  Worked to  decrease trunk and pelvic rotation with stance phase LLE. Worked with principles of buoyancy to address reach patterns with decreased effort.   Worked on modified swim programs on back and stomach with floatation belt.  Patient hopes to return to a swim exercise program and was a competent swimmer prior to stroke.  Patient started with floating on back, then progressed to kicking, use of arms, balancing core to reduce rolling left, then putting all motions together for back stroke.  Then repeated process for  front crawl modification.     05/07/22: Splinting: therapist cut and provided with strapping for custom brace for increased fit.  OT noted some gapping at wrist, discussed possibility of adding additional strapping at wrist, however pt declined.  Pt reports with increased stretching the brace tends to fit better than it is this am. PROM: OT providing PROM at wrist into flexion/extension and ulnar/radial deviation.  Noted fingers not as stiff as previous clinic session.  Pt continues with self-ROM at wrist and elbow while therapist cutting strapping for brace. Exercise: pt asking questions about use of Nustep and positioning of LUE.  Pt transferred on/off Nustep Mod I, demonstrating good understanding of hemi-technique with adjusting seat position and arm handles.  Pt completed 3-4 mins on Nustep with focus on appropriate LUE placement, OT providing min cues for attention to posture and positioning of UE in end ranges.   Pt reports also utilizing weight stack for stretches.  Pt demonstrating typical routine, OT providing min cues for positioning and cues to attend to shoulder stability during exercises.   GOALS: Goals reviewed with patient? Yes   SHORT TERM GOALS: Target date: 05/17/22   Pt will be independent with updated HEP. Baseline: Goal status: Met 05/16/22   2.  Patient will incorporate self stretching techniques throughout the day whether at home or work              Baseline:             Goal status: Met 05/16/22   3.  Pt will demonstrate A/ROM wrist extension to -10 for improved functional use.  Baseline: -32* Goal status: On going   4.  Pt will verbalize understanding of task modifications and/or potential AE needs to increase ease, safety, and independence w/ ADLs (rocker knife, adaptive cutting board, etc.) Baseline:  Goal status: On going     LONG TERM GOALS: Target date: 06/14/22   Pt will use LUE as a stabilizer/gross assist at least 50% of the time.  Baseline:  Goal  status: On going   2.  Pt will demonstrate ability to actively release an item than has been placed in left hand 50% of the time.  Baseline:  Goal status: On going   3.  Pt will demonstrate at least 80% A/ROM foream supination  following stretching in prep for improved functional use.              Baseline: Goal status: On going   4. Patient will reach to hit target at at least 120 degrees of shoulder flexion while upright with min compensatory movement/strategies.             Baseline:             Goal status: On going   5.  Patient will engage in aquatic therapy level exercises to increase independence with leisure pursuits of accessing pool for self-guided exercises.  Baseline:  Goal status: On going     ASSESSMENT:   CLINICAL  IMPRESSION: Patient continues to report decreased muscle tension in left arm - able to actively flex and extend elbow volitionally this session as well as open and close thumb and index finger.  Pt demonstrating good incorporation of stretching regimen at home and appreciative of recommendations for exercises he can complete during work tasks as well.   PERFORMANCE DEFICITS: in functional skills including ADLs, IADLs, dexterity, tone, ROM, strength, flexibility, Gross motor control, body mechanics, decreased knowledge of use of DME, and UE functional use and psychosocial skills including environmental adaptation.    IMPAIRMENTS: are limiting patient from ADLs, IADLs, and leisure.    CO-MORBIDITIES: may have co-morbidities  that affects occupational performance. Patient will benefit from skilled OT to address above impairments and improve overall function.   MODIFICATION OR ASSISTANCE TO COMPLETE EVALUATION: Min-Moderate modification of tasks or assist with assess necessary to complete an evaluation.   OT OCCUPATIONAL PROFILE AND HISTORY: Problem focused assessment: Including review of records relating to presenting problem.   CLINICAL DECISION MAKING:  Moderate - several treatment options, min-mod task modification necessary   REHAB POTENTIAL: Good   EVALUATION COMPLEXITY: Low       PLAN:   OT FREQUENCY: 1-2x/week   OT DURATION: 8 weeks   PLANNED INTERVENTIONS: self care/ADL training, therapeutic exercise, therapeutic activity, neuromuscular re-education, manual therapy, passive range of motion, aquatic therapy, electrical stimulation, ultrasound, compression bandaging, moist heat, cryotherapy, patient/family education, energy conservation, coping strategies training, and DME and/or AE instructions   RECOMMENDED OTHER SERVICES: aquatic therapy   CONSULTED AND AGREED WITH PLAN OF CARE: Patient   PLAN FOR NEXT SESSION: WB through LUE to include tall kneeling and quadruped, PROM at shoulder, elbow, wrist.  Initiate both gym and home HEP.  Continue to engage in aquatic with Antony Salmon.     Simonne Come, [place] 05/16/2022, 8:48 AM

## 2022-05-20 ENCOUNTER — Ambulatory Visit: Payer: Managed Care, Other (non HMO) | Admitting: Occupational Therapy

## 2022-05-23 ENCOUNTER — Ambulatory Visit: Payer: Managed Care, Other (non HMO) | Attending: Physical Medicine & Rehabilitation | Admitting: Occupational Therapy

## 2022-05-23 DIAGNOSIS — M6281 Muscle weakness (generalized): Secondary | ICD-10-CM | POA: Insufficient documentation

## 2022-05-23 DIAGNOSIS — R208 Other disturbances of skin sensation: Secondary | ICD-10-CM | POA: Insufficient documentation

## 2022-05-23 DIAGNOSIS — I69354 Hemiplegia and hemiparesis following cerebral infarction affecting left non-dominant side: Secondary | ICD-10-CM | POA: Insufficient documentation

## 2022-05-23 DIAGNOSIS — R29818 Other symptoms and signs involving the nervous system: Secondary | ICD-10-CM | POA: Diagnosis present

## 2022-05-23 DIAGNOSIS — R2681 Unsteadiness on feet: Secondary | ICD-10-CM | POA: Insufficient documentation

## 2022-05-23 NOTE — Therapy (Signed)
OUTPATIENT OCCUPATIONAL THERAPY TREATMENT NOTE   Patient Name: Dennis Mercado MRN: [id number] DOB:10-Jun-1981, 41 y.o., male Today's Date: 05/23/2022  PCP: Libby Maw, MD  REFERRING PROVIDER: Alysia Penna   END OF SESSION:   OT End of Session - 05/23/22 0811     Visit Number 8    Number of Visits 17    Date for OT Re-Evaluation 06/14/22    Authorization Type [employer]    Authorization Time Period per patient 30 visit OT limit    OT Start Time 0806    OT Stop Time 0846    OT Time Calculation (min) 40 min    Activity Tolerance Patient tolerated treatment well    Behavior During Therapy [facility] for tasks assessed/performed                No past medical history on file. Past Surgical History:  Procedure Laterality Date   WISDOM TOOTH EXTRACTION     Patient Active Problem List   Diagnosis Date Noted   Screening for prostate cancer 12/20/2021   Grieving 12/20/2021   Prediabetes 06/27/2021   Elevated glucose 10/13/2020   Mixed hyperlipidemia 10/05/2020   History of CVA (cerebrovascular accident) 01/19/2020   Normocytic anemia    Benign essential HTN    Hyponatremia    Acute blood loss anemia    Hypotension due to drugs    Vascular headache    Prerenal azotemia    Sleep disturbance    Leukocytosis    Transaminitis    Marijuana abuse    Essential hypertension    Dysphagia, post-stroke    Left hemiparesis (HCC)    Acute respiratory failure (HCC)    ICH (intracerebral hemorrhage) ([place]) 12/06/2019    ONSET DATE: 12/06/19 (recent referral: 04/09/22)   REFERRING DIAG: G81.14 (ICD-10-CM) - Left spastic hemiplegia   THERAPY DIAG:  Spastic hemiplegia of left nondominant side as late effect of cerebral infarction ([place])  Other disturbances of skin sensation  Muscle weakness (generalized)  Rationale for Evaluation and Treatment: Rehabilitation  PERTINENT HISTORY: Pt. presented to ED on 12/06/2019 with left side weakness and altered mental status.  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 Received inpatient rehab; was discharged 01/12/20. Pt returns to OT after botox on 02/05/22 by Dr. Letta Pate; HTN, left inattention, apraxia    PRECAUTIONS: Fall  SUBJECTIVE: "I had the day off yesterday. I was able to sleep in and then run some errands." PAIN:  Are you having pain? No   OBJECTIVE:   Today's Treatment:   05/23/22 Sitting AAROM: closed chain AAROM with LUE on large therapy ball.  Focus on hand opening and positioning of L hand on ball in preparation for shoulder flexion.  Engaged in shoulder flexion and elbow extension with focus on lengthening. PROM: Pt asking about wrist extension exercises to allow for BUE placement on edge of mat/chair/bed with full wrist extension.  Completed with hand over edge of mat progressively increasing wrist extension as able.  OT instructed on prayer stretch and PROM stretching at wrist. Quadruped: engaged in WB through LUE in quadruped with focus on forward and backward weight shifting and R to L weight shifting to facilitate increased WB through L elbow.  Difficulty maintaining elbow on mat table due to tone. Supine lengthening: engaged in snow angel and backstroke while in supine with focus on lengthening LUE at shoulder and elbow.  OT providing cues to visually attend to LUE, especially  when in "plane" position with arms extended to side to increase elbow extension.  Encouraged increased lengthening and stretching to allow for improved arm swing with ambulation.   05/16/22 Supine AAROM: closed chain AAROM with dowel with focus on shoulder flexion, shoulder abduction, and elbow extension.  Pt completed chest press x20 and horizontal abduction x20.  Transitioned to use of cane to allow for more neutral hand placement.   Sitting AAROM: elbow extension and wrist extension with use of cane to facilitate increased ROM.  OT  educated on engagement in exercises during daily, routine tasks.  Completed with forward shoulder flexion and at 45* angle to incorporate abduction as well. PROM wrist: OT completing PROM wrist flexion/extension and ulnar/radial deviation.   05/13/22:    Patient seen for aquatic therapy visit.  Patient entered and exited the pool via stairs and one hand rail on right (descending/ascending) Treatment occurred in 3.5-4.5 ft of warm water.   Warm up water walking forward, backward, side stepping.  Worked to increase elbow extension, and arm at side with walking in water.  Worked to improve coordination of UE/LE with trunk.  Worked to decrease trunk and pelvic rotation with stance phase LLE. Worked with principles of buoyancy to address reach patterns with decreased effort.   Worked on modified swim programs on back and stomach with floatation belt.  Patient hopes to return to a swim exercise program and was a competent swimmer prior to stroke.  Patient started with floating on back, then progressed to kicking, use of arms, balancing core to reduce rolling left, then putting all motions together for back stroke.  Then repeated process for front crawl modification.      GOALS: Goals reviewed with patient? Yes   SHORT TERM GOALS: Target date: 05/17/22   Pt will be independent with updated HEP. Baseline: Goal status: Met 05/16/22   2.  Patient will incorporate self stretching techniques throughout the day whether at home or work              Baseline:             Goal status: Met 05/16/22   3.  Pt will demonstrate A/ROM wrist extension to -10 for improved functional use.  Baseline: -32* Goal status: On going   4.  Pt will verbalize understanding of task modifications and/or potential AE needs to increase ease, safety, and independence w/ ADLs (rocker knife, adaptive cutting board, etc.) Baseline:  Goal status: On going     LONG TERM GOALS: Target date: 06/14/22   Pt will use LUE as a  stabilizer/gross assist at least 50% of the time.  Baseline:  Goal status: On going   2.  Pt will demonstrate ability to actively release an item than has been placed in left hand 50% of the time.  Baseline:  Goal status: On going   3.  Pt will demonstrate at least 80% A/ROM foream supination  following stretching in prep for improved functional use.              Baseline: Goal status: On going   4. Patient will reach to hit target at at least 120 degrees of shoulder flexion while upright with min compensatory movement/strategies.             Baseline:             Goal status: On going   5.  Patient will engage in aquatic therapy level exercises to increase independence with leisure pursuits of  accessing pool for self-guided exercises.  Baseline:  Goal status: On going     ASSESSMENT:   CLINICAL IMPRESSION: Patient notices shortening in LUE, especially when attempting WB through elbows in quadruped.  Pt receptive to snow angel and "back stroke" with focus on lengthening and coordinated movements.  Pt receptive to cues to visually attend to LUE and to allow increased time to facilitate full stretch.    PERFORMANCE DEFICITS: in functional skills including ADLs, IADLs, dexterity, tone, ROM, strength, flexibility, Gross motor control, body mechanics, decreased knowledge of use of DME, and UE functional use and psychosocial skills including environmental adaptation.    IMPAIRMENTS: are limiting patient from ADLs, IADLs, and leisure.    CO-MORBIDITIES: may have co-morbidities  that affects occupational performance. Patient will benefit from skilled OT to address above impairments and improve overall function.   MODIFICATION OR ASSISTANCE TO COMPLETE EVALUATION: Min-Moderate modification of tasks or assist with assess necessary to complete an evaluation.   OT OCCUPATIONAL PROFILE AND HISTORY: Problem focused assessment: Including review of records relating to presenting problem.    CLINICAL DECISION MAKING: Moderate - several treatment options, min-mod task modification necessary   REHAB POTENTIAL: Good   EVALUATION COMPLEXITY: Low       PLAN:   OT FREQUENCY: 1-2x/week   OT DURATION: 8 weeks   PLANNED INTERVENTIONS: self care/ADL training, therapeutic exercise, therapeutic activity, neuromuscular re-education, manual therapy, passive range of motion, aquatic therapy, electrical stimulation, ultrasound, compression bandaging, moist heat, cryotherapy, patient/family education, energy conservation, coping strategies training, and DME and/or AE instructions   RECOMMENDED OTHER SERVICES: aquatic therapy   CONSULTED AND AGREED WITH PLAN OF CARE: Patient   PLAN FOR NEXT SESSION: WB through LUE to include tall kneeling and quadruped, PROM at shoulder, elbow, wrist.  Pt wanting gym workouts for LUE and overall strengthening.  Continue to engage in aquatic with Antony Salmon.     Simonne Come, [place] 05/23/2022, 8:13 AM

## 2022-05-27 ENCOUNTER — Encounter: Payer: Self-pay | Admitting: Occupational Therapy

## 2022-05-27 ENCOUNTER — Ambulatory Visit: Payer: Managed Care, Other (non HMO) | Admitting: Occupational Therapy

## 2022-05-27 DIAGNOSIS — I69354 Hemiplegia and hemiparesis following cerebral infarction affecting left non-dominant side: Secondary | ICD-10-CM

## 2022-05-27 DIAGNOSIS — R2681 Unsteadiness on feet: Secondary | ICD-10-CM

## 2022-05-27 DIAGNOSIS — R29818 Other symptoms and signs involving the nervous system: Secondary | ICD-10-CM

## 2022-05-27 DIAGNOSIS — M6281 Muscle weakness (generalized): Secondary | ICD-10-CM

## 2022-05-27 DIAGNOSIS — R208 Other disturbances of skin sensation: Secondary | ICD-10-CM

## 2022-05-27 NOTE — Therapy (Signed)
OUTPATIENT OCCUPATIONAL THERAPY TREATMENT NOTE   Patient Name: Dennis Mercado MRN: [id number] DOB:[birth year], 41 y.o., male Today's Date: 05/23/2022  PCP: Libby Maw, MD  REFERRING PROVIDER: Alysia Penna   END OF SESSION:   OT End of Session - 05/23/22 0811     Visit Number 8    Number of Visits 17    Date for OT Re-Evaluation 06/14/22    Authorization Type [employer]    Authorization Time Period per patient 30 visit OT limit    OT Start Time 0806    OT Stop Time 0846    OT Time Calculation (min) 40 min    Activity Tolerance Patient tolerated treatment well    Behavior During Therapy [facility] for tasks assessed/performed                No past medical history on file. Past Surgical History:  Procedure Laterality Date   WISDOM TOOTH EXTRACTION     Patient Active Problem List   Diagnosis Date Noted   Screening for prostate cancer 12/20/2021   Grieving 12/20/2021   Prediabetes 06/27/2021   Elevated glucose 10/13/2020   Mixed hyperlipidemia 10/05/2020   History of CVA (cerebrovascular accident) 01/19/2020   Normocytic anemia    Benign essential HTN    Hyponatremia    Acute blood loss anemia    Hypotension due to drugs    Vascular headache    Prerenal azotemia    Sleep disturbance    Leukocytosis    Transaminitis    Marijuana abuse    Essential hypertension    Dysphagia, post-stroke    Left hemiparesis (HCC)    Acute respiratory failure (HCC)    ICH (intracerebral hemorrhage) ([place]) 12/06/2019    ONSET DATE: 12/06/19 (recent referral: 04/09/22)   REFERRING DIAG: G81.14 (ICD-10-CM) - Left spastic hemiplegia   THERAPY DIAG:  Spastic hemiplegia of left nondominant side as late effect of cerebral infarction ([place])  Other disturbances of skin sensation  Muscle weakness (generalized)  Rationale for Evaluation and Treatment: Rehabilitation  PERTINENT HISTORY: Pt. presented to ED on 12/06/2019 with left side weakness and altered mental status.  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 Received inpatient rehab; was discharged 01/12/20. Pt returns to OT after botox on 02/05/22 by Dr. Letta Pate; HTN, left inattention, apraxia    PRECAUTIONS: Fall  SUBJECTIVE: "I had the day off yesterday. I was able to sleep in and then run some errands." PAIN:  Are you having pain? No   OBJECTIVE:   Today's Treatment:   05/27/22 Patient seen for aquatic therapy visit.  Patient entered and exited the pool via stairs and one hand rail on right (descending/ascending) Treatment occurred in 3.5-4.5 ft of warm water.   Warm up water walking forward, backward, side stepping.  Worked to increase left hip and elbow extension.   Worked with principles of buoyancy to address core activation and balance. Used pool noodle for floatation and worked on achieving upright balanced state, then adding arm and/leg movement.  Worked on bilateral LE pushing off wall to back float with kick. Worked on resisted reach patterns and balance with  use of hand bells addressing flex/ext, abd/add, and bilateral/bimanual movements.   Worked on modified swim programs on back and stomach without floatation belt initially, then with pool noodle.  Worked on finding ways to place pool noodle under each arm independently.     Practiced walking into and out of whirlpool  with use of one rail.  Oriented patient to steps as unable to visualize with jets turned on.  Practiced entering/exiting/ accessing seat, and walking through increased turbulence from jets.    05/23/22 Sitting AAROM: closed chain AAROM with LUE on large therapy ball.  Focus on hand opening and positioning of L hand on ball in preparation for shoulder flexion.  Engaged in shoulder flexion and elbow extension with focus on lengthening. PROM: Pt asking about wrist extension exercises to allow for BUE placement on edge of mat/chair/bed with  full wrist extension.  Completed with hand over edge of mat progressively increasing wrist extension as able.  OT instructed on prayer stretch and PROM stretching at wrist. Quadruped: engaged in WB through LUE in quadruped with focus on forward and backward weight shifting and R to L weight shifting to facilitate increased WB through L elbow.  Difficulty maintaining elbow on mat table due to tone. Supine lengthening: engaged in snow angel and backstroke while in supine with focus on lengthening LUE at shoulder and elbow.  OT providing cues to visually attend to LUE, especially when in "plane" position with arms extended to side to increase elbow extension.  Encouraged increased lengthening and stretching to allow for improved arm swing with ambulation.   05/16/22 Supine AAROM: closed chain AAROM with dowel with focus on shoulder flexion, shoulder abduction, and elbow extension.  Pt completed chest press x20 and horizontal abduction x20.  Transitioned to use of cane to allow for more neutral hand placement.   Sitting AAROM: elbow extension and wrist extension with use of cane to facilitate increased ROM.  OT educated on engagement in exercises during daily, routine tasks.  Completed with forward shoulder flexion and at 45* angle to incorporate abduction as well. PROM wrist: OT completing PROM wrist flexion/extension and ulnar/radial deviation.   05/13/22:    Patient seen for aquatic therapy visit.  Patient entered and exited the pool via stairs and one hand rail on right (descending/ascending) Treatment occurred in 3.5-4.5 ft of warm water.   Warm up water walking forward, backward, side stepping.  Worked to increase elbow extension, and arm at side with walking in water.  Worked to improve coordination of UE/LE with trunk.  Worked to decrease trunk and pelvic rotation with stance phase LLE. Worked with principles of buoyancy to address reach patterns with decreased effort.   Worked on modified swim  programs on back and stomach with floatation belt.  Patient hopes to return to a swim exercise program and was a competent swimmer prior to stroke.  Patient started with floating on back, then progressed to kicking, use of arms, balancing core to reduce rolling left, then putting all motions together for back stroke.  Then repeated process for front crawl modification.      GOALS: Goals reviewed with patient? Yes   SHORT TERM GOALS: Target date: 05/17/22   Pt will be independent with updated HEP. Baseline: Goal status: Met 05/16/22   2.  Patient will incorporate self stretching techniques throughout the day whether at home or work              Baseline:             Goal status: Met 05/16/22   3.  Pt will demonstrate A/ROM wrist extension to -10 for improved functional use.  Baseline: -32* Goal status: On going   4.  Pt will verbalize understanding of task modifications and/or potential AE needs to increase ease, safety, and independence w/  ADLs (rocker knife, adaptive cutting board, etc.) Baseline:  Goal status: On going     LONG TERM GOALS: Target date: 06/14/22   Pt will use LUE as a stabilizer/gross assist at least 50% of the time.  Baseline:  Goal status: On going   2.  Pt will demonstrate ability to actively release an item than has been placed in left hand 50% of the time.  Baseline:  Goal status: On going   3.  Pt will demonstrate at least 80% A/ROM foream supination  following stretching in prep for improved functional use.              Baseline: Goal status: On going   4. Patient will reach to hit target at at least 120 degrees of shoulder flexion while upright with min compensatory movement/strategies.             Baseline:             Goal status: On going   5.  Patient will engage in aquatic therapy level exercises to increase independence with leisure pursuits of accessing pool for self-guided exercises.  Baseline:  Goal status: On going     ASSESSMENT:    CLINICAL IMPRESSION: Patient showing improved active and passive range of motion at shoulder and elbow.  Patient has begun exercising in gym, and is excited for pool exercise program.  Patient requesting PT pool referral will reach out to MD.     PERFORMANCE DEFICITS: in functional skills including ADLs, IADLs, dexterity, tone, ROM, strength, flexibility, Gross motor control, body mechanics, decreased knowledge of use of DME, and UE functional use and psychosocial skills including environmental adaptation.    IMPAIRMENTS: are limiting patient from ADLs, IADLs, and leisure.    CO-MORBIDITIES: may have co-morbidities  that affects occupational performance. Patient will benefit from skilled OT to address above impairments and improve overall function.   MODIFICATION OR ASSISTANCE TO COMPLETE EVALUATION: Min-Moderate modification of tasks or assist with assess necessary to complete an evaluation.   OT OCCUPATIONAL PROFILE AND HISTORY: Problem focused assessment: Including review of records relating to presenting problem.   CLINICAL DECISION MAKING: Moderate - several treatment options, min-mod task modification necessary   REHAB POTENTIAL: Good   EVALUATION COMPLEXITY: Low       PLAN:   OT FREQUENCY: 1-2x/week   OT DURATION: 8 weeks   PLANNED INTERVENTIONS: self care/ADL training, therapeutic exercise, therapeutic activity, neuromuscular re-education, manual therapy, passive range of motion, aquatic therapy, electrical stimulation, ultrasound, compression bandaging, moist heat, cryotherapy, patient/family education, energy conservation, coping strategies training, and DME and/or AE instructions   RECOMMENDED OTHER SERVICES: aquatic therapy   CONSULTED AND AGREED WITH PLAN OF CARE: Patient   PLAN FOR NEXT SESSION: WB through LUE to include tall kneeling and quadruped, PROM at shoulder, elbow, wrist.  Pt wanting gym workouts for LUE and overall strengthening.  For 12/18 Aquatic therapy  session - finalize aquatic exercise program.      [place], Judson Roch, OT 05/23/2022, 8:13 AM

## 2022-05-30 ENCOUNTER — Ambulatory Visit: Payer: Managed Care, Other (non HMO) | Admitting: Occupational Therapy

## 2022-05-30 DIAGNOSIS — R2681 Unsteadiness on feet: Secondary | ICD-10-CM

## 2022-05-30 DIAGNOSIS — I69354 Hemiplegia and hemiparesis following cerebral infarction affecting left non-dominant side: Secondary | ICD-10-CM | POA: Diagnosis not present

## 2022-05-30 DIAGNOSIS — M6281 Muscle weakness (generalized): Secondary | ICD-10-CM

## 2022-05-30 NOTE — Therapy (Addendum)
OUTPATIENT OCCUPATIONAL THERAPY TREATMENT NOTE   Patient Name: Dennis Mercado MRN: [id number] DOB:1981-01-30, 41 y.o., male Today's Date: 05/30/2022  PCP: Libby Maw, MD  REFERRING PROVIDER: Alysia Penna   END OF SESSION:   OT End of Session - 05/30/22 0934     Visit Number 10    Number of Visits 17    Date for OT Re-Evaluation 06/14/22    Authorization Type [employer]    Authorization Time Period per patient 30 visit OT limit    OT Start Time 0810    OT Stop Time 0852    OT Time Calculation (min) 42 min    Activity Tolerance Patient tolerated treatment well    Behavior During Therapy [facility] for tasks assessed/performed                 No past medical history on file. Past Surgical History:  Procedure Laterality Date   WISDOM TOOTH EXTRACTION     Patient Active Problem List   Diagnosis Date Noted   Screening for prostate cancer 12/20/2021   Grieving 12/20/2021   Prediabetes 06/27/2021   Elevated glucose 10/13/2020   Mixed hyperlipidemia 10/05/2020   History of CVA (cerebrovascular accident) 01/19/2020   Normocytic anemia    Benign essential HTN    Hyponatremia    Acute blood loss anemia    Hypotension due to drugs    Vascular headache    Prerenal azotemia    Sleep disturbance    Leukocytosis    Transaminitis    Marijuana abuse    Essential hypertension    Dysphagia, post-stroke    Left hemiparesis (HCC)    Acute respiratory failure (HCC)    ICH (intracerebral hemorrhage) ([place]) 12/06/2019    ONSET DATE: 12/06/19 (recent referral: 04/09/22)   REFERRING DIAG: G81.14 (ICD-10-CM) - Left spastic hemiplegia   THERAPY DIAG:  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 presented to ED on 12/06/2019 with left side weakness and altered mental status. Cranial CT scan  showed acute right thalamic hemorrhage extending into the ventricles, with local mass effect without midline shift. Received inpatient rehab; was discharged 01/12/20. Pt returns to OT after botox on 02/05/22 by Dr. Letta Pate; HTN, left inattention, apraxia    PRECAUTIONS: Fall  SUBJECTIVE: "I was working on balance reactions with PT at [place]." PAIN:  Are you having pain? No   OBJECTIVE:   Today's Treatment:   05/30/22 Pt seen for treatment in clinic setting.   NMR: Engaged in Single Arm Row, Single Arm Shoulder Extension, Standing Diagonal Chop, Standing High Row, and Shoulder External Rotation with resistance on weight stack.  OT stabilized LUE on hand grip with use of coban to facilitate improved wrist position and sustained grasp.  OT providing min tactile cues and mod demonstration and verbal cues for improved technique as pt with tendency to drop L shoulder and use trunk to compensate for decreased UE ROM.  Utilized 5# resistance with pt able to demonstrate good technique with use of tactile and verbal cues as well as intermittent use of mirror for improved carryover.  Pt with difficulty with external rotation against resistance, therefore terminated task. NMR: Seated Shoulder External Rotation with cane for improved focus on technique due to decreased ability to complete against resistance.  Pt demonstrating good understanding of modification.   05/27/22 Patient seen for aquatic therapy  visit.  Patient entered and exited the pool via stairs and one hand rail on right (descending/ascending) Treatment occurred in 3.5-4.5 ft of warm water.   Warm up water walking forward, backward, side stepping.  Worked to increase left hip and elbow extension.   Worked with principles of buoyancy to address core activation and balance. Used pool noodle for floatation and worked on achieving upright balanced state, then adding arm and/leg movement.  Worked on bilateral LE pushing off wall to  back float with kick. Worked on resisted reach patterns and balance with  use of hand bells addressing flex/ext, abd/add, and bilateral/bimanual movements.   Worked on modified swim programs on back and stomach without floatation belt initially, then with pool noodle.  Worked on finding ways to place pool noodle under each arm independently.     Practiced walking into and out of whirlpool with use of one rail.  Oriented patient to steps as unable to visualize with jets turned on.  Practiced entering/exiting/ accessing seat, and walking through increased turbulence from jets.    05/23/22 Sitting AAROM: closed chain AAROM with LUE on large therapy ball.  Focus on hand opening and positioning of L hand on ball in preparation for shoulder flexion.  Engaged in shoulder flexion and elbow extension with focus on lengthening. PROM: Pt asking about wrist extension exercises to allow for BUE placement on edge of mat/chair/bed with full wrist extension.  Completed with hand over edge of mat progressively increasing wrist extension as able.  OT instructed on prayer stretch and PROM stretching at wrist. Quadruped: engaged in WB through LUE in quadruped with focus on forward and backward weight shifting and R to L weight shifting to facilitate increased WB through L elbow.  Difficulty maintaining elbow on mat table due to tone. Supine lengthening: engaged in snow angel and backstroke while in supine with focus on lengthening LUE at shoulder and elbow.  OT providing cues to visually attend to LUE, especially when in "plane" position with arms extended to side to increase elbow extension.  Encouraged increased lengthening and stretching to allow for improved arm swing with ambulation.    Exercise Program: Access Code: [email or web address] URL: [email or web address] Date: 05/30/2022 Prepared by: [facility]  Exercises - Single Arm Row with Trunk Rotation  - 1 x daily - 3 x  weekly - 2 sets - 10 reps - Single Arm Shoulder Extension with Anchored Resistance  - 1 x daily - 3 x weekly - 2 sets - 10 reps - Standing Diagonal Chop  - 1 x daily - 3 x weekly - 2 sets - 10 reps - Standing High Row with Anchored Resistance  - 1 x daily - 3 x weekly - 2 sets - 10 reps - Seated Shoulder External Rotation AAROM with Cane and Hand in Neutral  - 2 x daily - 5 x weekly - 2 sets - 10 reps    GOALS: Goals reviewed with patient? Yes   SHORT TERM GOALS: Target date: 05/17/22   Pt will be independent with updated HEP. Baseline: Goal status: Met 05/16/22   2.  Patient will incorporate self stretching techniques throughout the day whether at home or work              Baseline:             Goal status: Met 05/16/22   3.  Pt will demonstrate A/ROM wrist extension to -10 for improved functional use.  Baseline: -32* Goal status: On going   4.  Pt will verbalize understanding of task modifications and/or potential AE needs to increase ease, safety, and independence w/ ADLs (rocker knife, adaptive cutting board, etc.) Baseline:  Goal status: On going     LONG TERM GOALS: Target date: 06/14/22   Pt will use LUE as a stabilizer/gross assist at least 50% of the time.  Baseline:  Goal status: On going   2.  Pt will demonstrate ability to actively release an item than has been placed in left hand 50% of the time.  Baseline:  Goal status: On going   3.  Pt will demonstrate at least 80% A/ROM foream supination  following stretching in prep for improved functional use.              Baseline: Goal status: On going   4. Patient will reach to hit target at at least 120 degrees of shoulder flexion while upright with min compensatory movement/strategies.             Baseline:             Goal status: On going   5.  Patient will engage in aquatic therapy level exercises to increase independence with leisure pursuits of accessing pool for self-guided exercises.  Baseline:  Goal  status: On going     ASSESSMENT:   CLINICAL IMPRESSION: Patient showing improved active and passive range of motion at shoulder and elbow.  Pt initially compensating with trunk rotation, L shoulder internal rotation, and shoulder drop, however with mod cues and demonstration pt demonstrating carryover and ability to recognize compensatory movements and attempt to correct with decreased cues.   PERFORMANCE DEFICITS: in functional skills including ADLs, IADLs, dexterity, tone, ROM, strength, flexibility, Gross motor control, body mechanics, decreased knowledge of use of DME, and UE functional use and psychosocial skills including environmental adaptation.    IMPAIRMENTS: are limiting patient from ADLs, IADLs, and leisure.    CO-MORBIDITIES: may have co-morbidities  that affects occupational performance. Patient will benefit from skilled OT to address above impairments and improve overall function.   MODIFICATION OR ASSISTANCE TO COMPLETE EVALUATION: Min-Moderate modification of tasks or assist with assess necessary to complete an evaluation.   OT OCCUPATIONAL PROFILE AND HISTORY: Problem focused assessment: Including review of records relating to presenting problem.   CLINICAL DECISION MAKING: Moderate - several treatment options, min-mod task modification necessary   REHAB POTENTIAL: Good   EVALUATION COMPLEXITY: Low       PLAN:   OT FREQUENCY: 1-2x/week   OT DURATION: 8 weeks   PLANNED INTERVENTIONS: self care/ADL training, therapeutic exercise, therapeutic activity, neuromuscular re-education, manual therapy, passive range of motion, aquatic therapy, electrical stimulation, ultrasound, compression bandaging, moist heat, cryotherapy, patient/family education, energy conservation, coping strategies training, and DME and/or AE instructions   RECOMMENDED OTHER SERVICES: aquatic therapy   CONSULTED AND AGREED WITH PLAN OF CARE: Patient   PLAN FOR NEXT SESSION: WB through LUE to  include tall kneeling and quadruped, PROM at shoulder, elbow, wrist.  Pt wanting gym workouts for LUE and overall strengthening.  For 12/18 Aquatic therapy session - finalize aquatic exercise program.      [place], Judson Roch, OT 05/30/2022, 9:35 AM

## 2022-06-03 ENCOUNTER — Ambulatory Visit: Payer: Managed Care, Other (non HMO) | Admitting: Occupational Therapy

## 2022-06-03 ENCOUNTER — Encounter: Payer: Self-pay | Admitting: Occupational Therapy

## 2022-06-03 DIAGNOSIS — I69354 Hemiplegia and hemiparesis following cerebral infarction affecting left non-dominant side: Secondary | ICD-10-CM

## 2022-06-03 DIAGNOSIS — M6281 Muscle weakness (generalized): Secondary | ICD-10-CM

## 2022-06-03 DIAGNOSIS — R208 Other disturbances of skin sensation: Secondary | ICD-10-CM

## 2022-06-03 DIAGNOSIS — R2681 Unsteadiness on feet: Secondary | ICD-10-CM

## 2022-06-03 NOTE — Therapy (Signed)
OUTPATIENT OCCUPATIONAL THERAPY TREATMENT NOTE   Patient Name: Dennis Mercado MRN: [id number] DOB:[birth year], 41 y.o., male Today's Date: 06/03/2022  PCP: Libby Maw, MD  REFERRING PROVIDER: Alysia Penna   END OF SESSION:   OT End of Session - 06/03/22 1607     Visit Number 11    Number of Visits 17    Date for OT Re-Evaluation 06/14/22    Authorization Type [employer]    Authorization Time Period per patient 30 visit OT limit    OT Start Time 1413    OT Stop Time 1503    OT Time Calculation (min) 50 min    Activity Tolerance Patient tolerated treatment well    Behavior During Therapy [facility] for tasks assessed/performed                 History reviewed. No pertinent past medical history. Past Surgical History:  Procedure Laterality Date   WISDOM TOOTH EXTRACTION     Patient Active Problem List   Diagnosis Date Noted   Screening for prostate cancer 12/20/2021   Grieving 12/20/2021   Prediabetes 06/27/2021   Elevated glucose 10/13/2020   Mixed hyperlipidemia 10/05/2020   History of CVA (cerebrovascular accident) 01/19/2020   Normocytic anemia    Benign essential HTN    Hyponatremia    Acute blood loss anemia    Hypotension due to drugs    Vascular headache    Prerenal azotemia    Sleep disturbance    Leukocytosis    Transaminitis    Marijuana abuse    Essential hypertension    Dysphagia, post-stroke    Left hemiparesis (HCC)    Acute respiratory failure (HCC)    ICH (intracerebral hemorrhage) ([place]) 12/06/2019    ONSET DATE: 12/06/19 (recent referral: 04/09/22)   REFERRING DIAG: G81.14 (ICD-10-CM) - Left spastic hemiplegia   THERAPY DIAG:  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 presented to ED on 12/06/2019 with left  side weakness and altered mental status. 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 Received inpatient rehab; was discharged 01/12/20. Pt returns to OT after botox on 02/05/22 by Dr. Letta Pate; HTN, left inattention, apraxia    PRECAUTIONS: Fall  SUBJECTIVE: "I was working on balance reactions with PT at [place]." PAIN:  Are you having pain? No   OBJECTIVE:   Today's Treatment:   06/03/22 Aquatic therapy session as indicated prior.  On land reviewed written aquatic therapy exercise program.   In water demonstrated. Practiced and reviewed exercise program.  Warm up 10 min water walking forward, sidestep, and backward.  Reviewed points of emphasis - posture. Step width and length, arm lengthening, and balance.  Complete in 4.5 ft of water.  Patient able to safely complete without physical assistance Arm stretch on railing:  Stretches for elbow extension and shoulder flexion.  Cueing patient to be close to left rail initially to allow hand onto rail, then adjust body accordingly.   Balance/Arm Work:  Demonstrated and practiced with hand bells exercise near wall - but not on wall.  Shoulder flex/ext bilaterally and bimanually, then horizontal abd/adduction, and bicep curls.  Completed first with feet shoulder width apart, then in stride R foot forward, then left foot forward.   Reviewed technique for safe  components of swimming with pool noodle.   Reviewed process to obtain any needed pool equipment from lifeguard.   Patient pleased with progress and has completed the OT  aquatics component of this episode of care.   05/30/22 Pt seen for treatment in clinic setting.   NMR: Engaged in Single Arm Row, Single Arm Shoulder Extension, Standing Diagonal Chop, Standing High Row, and Shoulder External Rotation with resistance on weight stack.  OT stabilized LUE on hand grip with use of coban to facilitate  improved wrist position and sustained grasp.  OT providing min tactile cues and mod demonstration and verbal cues for improved technique as pt with tendency to drop L shoulder and use trunk to compensate for decreased UE ROM.  Utilized 5# resistance with pt able to demonstrate good technique with use of tactile and verbal cues as well as intermittent use of mirror for improved carryover.  Pt with difficulty with external rotation against resistance, therefore terminated task. NMR: Seated Shoulder External Rotation with cane for improved focus on technique due to decreased ability to complete against resistance.  Pt demonstrating good understanding of modification.   05/27/22 Patient seen for aquatic therapy visit.  Patient entered and exited the pool via stairs and one hand rail on right (descending/ascending) Treatment occurred in 3.5-4.5 ft of warm water.   Warm up water walking forward, backward, side stepping.  Worked to increase left hip and elbow extension.   Worked with principles of buoyancy to address core activation and balance. Used pool noodle for floatation and worked on achieving upright balanced state, then adding arm and/leg movement.  Worked on bilateral LE pushing off wall to back float with kick. Worked on resisted reach patterns and balance with  use of hand bells addressing flex/ext, abd/add, and bilateral/bimanual movements.   Worked on modified swim programs on back and stomach without floatation belt initially, then with pool noodle.  Worked on finding ways to place pool noodle under each arm independently.     Practiced walking into and out of whirlpool with use of one rail.  Oriented patient to steps as unable to visualize with jets turned on.  Practiced entering/exiting/ accessing seat, and walking through increased turbulence from jets.    05/23/22 Sitting AAROM: closed chain AAROM with LUE on large therapy ball.  Focus on hand opening and positioning of L hand on ball in  preparation for shoulder flexion.  Engaged in shoulder flexion and elbow extension with focus on lengthening. PROM: Pt asking about wrist extension exercises to allow for BUE placement on edge of mat/chair/bed with full wrist extension.  Completed with hand over edge of mat progressively increasing wrist extension as able.  OT instructed on prayer stretch and PROM stretching at wrist. Quadruped: engaged in WB through LUE in quadruped with focus on forward and backward weight shifting and R to L weight shifting to facilitate increased WB through L elbow.  Difficulty maintaining elbow on mat table due to tone. Supine lengthening: engaged in snow angel and backstroke while in supine with focus on lengthening LUE at shoulder and elbow.  OT providing cues to visually attend to LUE, especially when in "plane" position with arms extended to side to increase elbow extension.  Encouraged increased lengthening and stretching to allow for improved arm swing with ambulation.    Exercise Program: Access Code: [email or web address] URL: [email or web address] Date: 05/30/2022 Prepared by: [facility]  Exercises - Single Arm Row with Trunk Rotation  -  1 x daily - 3 x weekly - 2 sets - 10 reps - Single Arm Shoulder Extension with Anchored Resistance  - 1 x daily - 3 x weekly - 2 sets - 10 reps - Standing Diagonal Chop  - 1 x daily - 3 x weekly - 2 sets - 10 reps - Standing High Row with Anchored Resistance  - 1 x daily - 3 x weekly - 2 sets - 10 reps - Seated Shoulder External Rotation AAROM with Cane and Hand in Neutral  - 2 x daily - 5 x weekly - 2 sets - 10 reps    GOALS: Goals reviewed with patient? Yes   SHORT TERM GOALS: Target date: 05/17/22   Pt will be independent with updated HEP. Baseline: Goal status: Met 05/16/22   2.  Patient will incorporate self stretching techniques throughout the day whether at home or work              Baseline:             Goal  status: Met 05/16/22   3.  Pt will demonstrate A/ROM wrist extension to -10 for improved functional use.  Baseline: -32* Goal status: On going   4.  Pt will verbalize understanding of task modifications and/or potential AE needs to increase ease, safety, and independence w/ ADLs (rocker knife, adaptive cutting board, etc.) Baseline:  Goal status: On going     LONG TERM GOALS: Target date: 06/14/22   Pt will use LUE as a stabilizer/gross assist at least 50% of the time.  Baseline:  Goal status: On going   2.  Pt will demonstrate ability to actively release an item than has been placed in left hand 50% of the time.  Baseline:  Goal status: On going   3.  Pt will demonstrate at least 80% A/ROM foream supination  following stretching in prep for improved functional use.              Baseline: Goal status: On going   4. Patient will reach to hit target at at least 120 degrees of shoulder flexion while upright with min compensatory movement/strategies.             Baseline:             Goal status: On going   5.  Patient will engage in aquatic therapy level exercises to increase independence with leisure pursuits of accessing pool for self-guided exercises.  Baseline:  Goal status: On going     ASSESSMENT:   CLINICAL IMPRESSION: Patient showing improved active and passive range of motion at shoulder and elbow.  Pt initially compensating with trunk rotation, L shoulder internal rotation, and shoulder drop, however with mod cues and demonstration pt demonstrating carryover and ability to recognize compensatory movements and attempt to correct with decreased cues.   PERFORMANCE DEFICITS: in functional skills including ADLs, IADLs, dexterity, tone, ROM, strength, flexibility, Gross motor control, body mechanics, decreased knowledge of use of DME, and UE functional use and psychosocial skills including environmental adaptation.    IMPAIRMENTS: are limiting patient from ADLs, IADLs, and  leisure.    CO-MORBIDITIES: may have co-morbidities  that affects occupational performance. Patient will benefit from skilled OT to address above impairments and improve overall function.   MODIFICATION OR ASSISTANCE TO COMPLETE EVALUATION: Min-Moderate modification of tasks or assist with assess necessary to complete an evaluation.   OT OCCUPATIONAL PROFILE AND HISTORY: Problem focused assessment: Including review of records relating to presenting  problem.   CLINICAL DECISION MAKING: Moderate - several treatment options, min-mod task modification necessary   REHAB POTENTIAL: Good   EVALUATION COMPLEXITY: Low       PLAN:   OT FREQUENCY: 1-2x/week   OT DURATION: 8 weeks   PLANNED INTERVENTIONS: self care/ADL training, therapeutic exercise, therapeutic activity, neuromuscular re-education, manual therapy, passive range of motion, aquatic therapy, electrical stimulation, ultrasound, compression bandaging, moist heat, cryotherapy, patient/family education, energy conservation, coping strategies training, and DME and/or AE instructions   RECOMMENDED OTHER SERVICES: aquatic therapy   CONSULTED AND AGREED WITH PLAN OF CARE: Patient   PLAN FOR NEXT SESSION: WB through LUE to include tall kneeling and quadruped, PROM at shoulder, elbow, wrist.  Pt wanting gym workouts for LUE and overall strengthening.    Mariah Milling, OT 06/03/2022, 4:09 PM

## 2022-06-06 ENCOUNTER — Telehealth: Payer: Self-pay | Admitting: *Deleted

## 2022-06-06 ENCOUNTER — Ambulatory Visit: Payer: Managed Care, Other (non HMO) | Admitting: Occupational Therapy

## 2022-06-06 DIAGNOSIS — I69354 Hemiplegia and hemiparesis following cerebral infarction affecting left non-dominant side: Secondary | ICD-10-CM

## 2022-06-06 DIAGNOSIS — Z8673 Personal history of transient ischemic attack (TIA), and cerebral infarction without residual deficits: Secondary | ICD-10-CM

## 2022-06-06 DIAGNOSIS — R2681 Unsteadiness on feet: Secondary | ICD-10-CM

## 2022-06-06 DIAGNOSIS — G8114 Spastic hemiplegia affecting left nondominant side: Secondary | ICD-10-CM

## 2022-06-06 DIAGNOSIS — M6281 Muscle weakness (generalized): Secondary | ICD-10-CM

## 2022-06-06 NOTE — Therapy (Signed)
OUTPATIENT OCCUPATIONAL THERAPY TREATMENT NOTE   Patient Name: Dennis Mercado MRN: [id number] DOB:1980/07/03, 41 y.o., male Today's Date: 06/06/2022  PCP: Libby Maw, MD  REFERRING PROVIDER: Alysia Penna   END OF SESSION:   OT End of Session - 06/06/22 0945     Visit Number 12    Number of Visits 17    Date for OT Re-Evaluation 06/14/22    Authorization Type [employer]    Authorization Time Period per patient 30 visit OT limit    OT Start Time 0816   arrival time   OT Stop Time 0846    OT Time Calculation (min) 30 min    Activity Tolerance Patient tolerated treatment well    Behavior During Therapy [facility] for tasks assessed/performed                  No past medical history on file. Past Surgical History:  Procedure Laterality Date   WISDOM TOOTH EXTRACTION     Patient Active Problem List   Diagnosis Date Noted   Screening for prostate cancer 12/20/2021   Grieving 12/20/2021   Prediabetes 06/27/2021   Elevated glucose 10/13/2020   Mixed hyperlipidemia 10/05/2020   History of CVA (cerebrovascular accident) 01/19/2020   Normocytic anemia    Benign essential HTN    Hyponatremia    Acute blood loss anemia    Hypotension due to drugs    Vascular headache    Prerenal azotemia    Sleep disturbance    Leukocytosis    Transaminitis    Marijuana abuse    Essential hypertension    Dysphagia, post-stroke    Left hemiparesis (HCC)    Acute respiratory failure (HCC)    ICH (intracerebral hemorrhage) ([place]) 12/06/2019    ONSET DATE: 12/06/19 (recent referral: 04/09/22)   REFERRING DIAG: G81.14 (ICD-10-CM) - Left spastic hemiplegia   THERAPY DIAG:  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 presented to ED on 12/06/2019 with left side weakness and altered mental status.  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 Received inpatient rehab; was discharged 01/12/20. Pt returns to OT after botox on 02/05/22 by Dr. Letta Pate; HTN, left inattention, apraxia    PRECAUTIONS: Fall  SUBJECTIVE: "I worked on the arm bike, but I will need to adjust the arm setting for my L arm" PAIN:  Are you having pain? No   OBJECTIVE:   Today's Treatment:   06/06/22 Pt seen for treatment in clinic setting.  Pt brought in gripping aid/hand support to maintain grasp on handle with weight stack.  Trialed setup during session to carry over to use at the gym.  Pt requiring increased time and min cues for trouble shooting due to only being able to utilize RUE to don over L hand.   NMR: Engaged in Single Arm Row, Single Arm Shoulder Extension, and Standing High Row with resistance on weight stack.  OT providing min tactile cues and mod demonstration and verbal cues for improved technique as pt with tendency to drop L shoulder and use trunk to compensate for decreased UE ROM.  OT aiding in last portion of shoulder extension to hip for increased ROM and strengthening.  Utilized 5# resistance with pt able to demonstrate good technique with use of tactile and verbal cues as well as  intermittent tactile cues for improved positioning and technique.  Pt with decreased supination to neutral and recommended for single arm row and standing high row.   06/03/22 Aquatic therapy session as indicated prior.  On land reviewed written aquatic therapy exercise program.   In water demonstrated. Practiced and reviewed exercise program.  Warm up 10 min water walking forward, sidestep, and backward.  Reviewed points of emphasis - posture. Step width and length, arm lengthening, and balance.  Complete in 4.5 ft of water.  Patient able to safely complete without physical assistance Arm stretch on railing:  Stretches for elbow  extension and shoulder flexion.  Cueing patient to be close to left rail initially to allow hand onto rail, then adjust body accordingly.   Balance/Arm Work:  Demonstrated and practiced with hand bells exercise near wall - but not on wall.  Shoulder flex/ext bilaterally and bimanually, then horizontal abd/adduction, and bicep curls.  Completed first with feet shoulder width apart, then in stride R foot forward, then left foot forward.   Reviewed technique for safe components of swimming with pool noodle.   Reviewed process to obtain any needed pool equipment from lifeguard.   Patient pleased with progress and has completed the OT  aquatics component of this episode of care.     05/30/22 Pt seen for treatment in clinic setting.   NMR: Engaged in Single Arm Row, Single Arm Shoulder Extension, Standing Diagonal Chop, Standing High Row, and Shoulder External Rotation with resistance on weight stack.  OT stabilized LUE on hand grip with use of coban to facilitate improved wrist position and sustained grasp.  OT providing min tactile cues and mod demonstration and verbal cues for improved technique as pt with tendency to drop L shoulder and use trunk to compensate for decreased UE ROM.  Utilized 5# resistance with pt able to demonstrate good technique with use of tactile and verbal cues as well as intermittent use of mirror for improved carryover.  Pt with difficulty with external rotation against resistance, therefore terminated task. NMR: Seated Shoulder External Rotation with cane for improved focus on technique due to decreased ability to complete against resistance.  Pt demonstrating good understanding of modification.    Exercise Program: Access Code: [email or web address] URL: [email or web address] Date: 05/30/2022 Prepared by: [facility]  Exercises - Single Arm Row with Trunk Rotation  - 1 x daily - 3 x weekly - 2 sets - 10 reps - Single Arm Shoulder  Extension with Anchored Resistance  - 1 x daily - 3 x weekly - 2 sets - 10 reps - Standing Diagonal Chop  - 1 x daily - 3 x weekly - 2 sets - 10 reps - Standing High Row with Anchored Resistance  - 1 x daily - 3 x weekly - 2 sets - 10 reps - Seated Shoulder External Rotation AAROM with Cane and Hand in Neutral  - 2 x daily - 5 x weekly - 2 sets - 10 reps    GOALS: Goals reviewed with patient? Yes   SHORT TERM GOALS: Target date: 05/17/22   Pt will be independent with updated HEP. Baseline: Goal status: Met 05/16/22   2.  Patient will incorporate self stretching techniques throughout the day whether at home or work              Baseline:             Goal status: Met 05/16/22   3.  Pt will  demonstrate A/ROM wrist extension to -10 for improved functional use.  Baseline: -32* Goal status: On going   4.  Pt will verbalize understanding of task modifications and/or potential AE needs to increase ease, safety, and independence w/ ADLs (rocker knife, adaptive cutting board, etc.) Baseline:  Goal status: On going     LONG TERM GOALS: Target date: 06/14/22   Pt will use LUE as a stabilizer/gross assist at least 50% of the time.  Baseline:  Goal status: On going   2.  Pt will demonstrate ability to actively release an item than has been placed in left hand 50% of the time.  Baseline:  Goal status: On going   3.  Pt will demonstrate at least 80% A/ROM foream supination  following stretching in prep for improved functional use.              Baseline: Goal status: On going   4. Patient will reach to hit target at at least 120 degrees of shoulder flexion while upright with min compensatory movement/strategies.             Baseline:             Goal status: On going   5.  Patient will engage in aquatic therapy level exercises to increase independence with leisure pursuits of accessing pool for self-guided exercises.  Baseline:  Goal status: Met - 06/03/22     ASSESSMENT:    CLINICAL IMPRESSION: Patient showing improved active and passive range of motion at shoulder and elbow.  Pt continues to compensate with trunk rotation, L shoulder internal rotation, and shoulder drop, however demonstrating carryover and ability to recognize compensatory movements and attempt to correct with decreased cues.  Pt continues to demonstrate decreased supination, even to neutral position as needed for best positioning for above exercises.   PERFORMANCE DEFICITS: in functional skills including ADLs, IADLs, dexterity, tone, ROM, strength, flexibility, Gross motor control, body mechanics, decreased knowledge of use of DME, and UE functional use and psychosocial skills including environmental adaptation.    IMPAIRMENTS: are limiting patient from ADLs, IADLs, and leisure.    CO-MORBIDITIES: may have co-morbidities  that affects occupational performance. Patient will benefit from skilled OT to address above impairments and improve overall function.   MODIFICATION OR ASSISTANCE TO COMPLETE EVALUATION: Min-Moderate modification of tasks or assist with assess necessary to complete an evaluation.   OT OCCUPATIONAL PROFILE AND HISTORY: Problem focused assessment: Including review of records relating to presenting problem.   CLINICAL DECISION MAKING: Moderate - several treatment options, min-mod task modification necessary   REHAB POTENTIAL: Good   EVALUATION COMPLEXITY: Low       PLAN:   OT FREQUENCY: 1-2x/week   OT DURATION: 8 weeks   PLANNED INTERVENTIONS: self care/ADL training, therapeutic exercise, therapeutic activity, neuromuscular re-education, manual therapy, passive range of motion, aquatic therapy, electrical stimulation, ultrasound, compression bandaging, moist heat, cryotherapy, patient/family education, energy conservation, coping strategies training, and DME and/or AE instructions   RECOMMENDED OTHER SERVICES: aquatic therapy   CONSULTED AND AGREED WITH PLAN OF CARE:  Patient   PLAN FOR NEXT SESSION: Review weight stack HEP, supine stretching, assess goals and d/c if warranted.  Simonne Come, OT 06/06/2022, 9:45 AM

## 2022-06-06 NOTE — Addendum Note (Signed)
Addended by: Caro Hight on: 06/06/2022 10:42 AM   Modules accepted: Orders

## 2022-06-06 NOTE — Telephone Encounter (Signed)
Per Dr Letta Pate referral placed for aquatic therapy to [place] location.

## 2022-06-06 NOTE — Telephone Encounter (Signed)
-----   Message from Andrew E Kirsteins, MD sent at 06/04/2022  7:23 AM EST ----- Please send order for aquatic PT at [facility] ----- Message ----- From: Gellert, Kristin M, OT Sent: 06/03/2022   4:20 PM EST To: Andrew E Kirsteins, MD; Sarah M Hoxie, OT  Dr K- I have finished the OT aquatics portion of this episode of care for OT.  He is still seeing Sarah OT at [facility].   [facility] is interested in an order for PT for aquatics.   Can you please send an order to [facility] if you agree?   He could continue to benefit for LLE/gait and balance.   Thanks so much! Kris   

## 2022-06-06 NOTE — Telephone Encounter (Signed)
After speaking with Erasmo Downer PT I have reordered  the PT for [facility].

## 2022-06-11 ENCOUNTER — Encounter
Payer: Managed Care, Other (non HMO) | Attending: Physical Medicine & Rehabilitation | Admitting: Physical Medicine & Rehabilitation

## 2022-06-11 ENCOUNTER — Encounter: Payer: Self-pay | Admitting: Physical Medicine & Rehabilitation

## 2022-06-11 VITALS — BP 136/87 | HR 94 | Ht 67.0 in | Wt 163.0 lb

## 2022-06-11 DIAGNOSIS — G8114 Spastic hemiplegia affecting left nondominant side: Secondary | ICD-10-CM | POA: Insufficient documentation

## 2022-06-11 NOTE — Progress Notes (Signed)
Subjective:    Patient ID: Dennis Mercado, male    DOB: 03-25-1981, 41 y.o.   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  No postinjection complications.  The patient feels like his hand is more relaxed.  His wife has noted that his fingernails are easier to cut because of hand position.  The patient is also able to assume quadruped position more readily due to decreased left wrist flexor spasticity. 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 Enema or suppository use No  History of colostomy No  Incontinent No   BLADDER Normal In and out cath, frequency . Able to self cath  . Bladder incontinence No  Frequent urination No  Leakage with coughing No  Difficulty starting stream No  Incomplete bladder emptying No    Mobility use a cane  Function employed # of hrs/week 40  Neuro/Psych weakness  Prior Studies Any changes since last visit?  no  Physicians involved in your care Any changes since last visit?  no   No family history on file. Social History   Socioeconomic History   Marital status: Married    Spouse name: Not on file   Number of children: Not on file   Years of education: Not on file   Highest education level: Not on file  Occupational History   Not on file  Tobacco Use   Smoking status: Never   Smokeless tobacco: Never  Vaping Use   Vaping Use: Never used  Substance and Sexual Activity   Alcohol use: Not Currently    Comment: several drinks over the weekend    Drug use: Never   Sexual activity: Not on file  Other Topics Concern   Not on file  Social History Narrative   Not on file   Social Determinants of Health   Financial Resource Strain: Not on file  Food Insecurity: Not on file  Transportation Needs: Not on file  Physical Activity: Not on file  Stress: Not on file  Social Connections: Not on file   Past Surgical History:  Procedure Laterality Date   WISDOM TOOTH EXTRACTION     No past medical history on file. BP 136/87   Pulse 94   Ht '5\' 7"'$  (1.702 m)   Wt 163 lb (73.9 kg)   SpO2 97%   BMI 25.53 kg/m   Opioid Risk Score:   Fall Risk Score:  `1  Depression screen [facility] 2/9     04/30/2022   10:05 AM 02/05/2022   10:04 AM 12/11/2021   10:02 AM 08/31/2021    9:16 AM 08/14/2021    8:11 AM 07/20/2021   10:14 AM 06/27/2021    8:44 AM  Depression screen [facility] 2/9  Decreased Interest 0 0 0 0 0 0 0  Down, Depressed, Hopeless 0 0 0 0 0 0 0  PHQ - 2 Score 0 0 0 0 0 0 0      Review of Systems  Objective:   Physical Exam  General no acute distress Mood and affect are appropriate Extremities without edema Tone MAS 2 at the elbow flexors MAS 3 at the wrist flexor MAS 2 at the finger flexors on the left side Motor strength is 3 - at the deltoid bicep tricep 0 at the finger extensors and wrist extensors. Sided strength is normal Left lower extremity 3 - at the hip flexor 4 at the knee extensor trace ankle plantar flexors 0 ankle dorsiflexor      Assessment & Plan:   1.  Left spastic hemiplegia secondary to right thalamic hemorrhage proximately 2 years ago.  Continues to make good functional recovery.  Plan to continue current dosing of Dysport as below.  Have encouraged patient to do wrist flexor stretching.  Patient wearing his resting hand splint at night as well.  Dysport  LEFT FCR 200 FDS 200 FDP 200 Biceps 500 BR 200 FPL 100 FCU 100

## 2022-06-13 ENCOUNTER — Ambulatory Visit: Payer: Managed Care, Other (non HMO) | Admitting: Occupational Therapy

## 2022-06-13 DIAGNOSIS — R208 Other disturbances of skin sensation: Secondary | ICD-10-CM

## 2022-06-13 DIAGNOSIS — R2681 Unsteadiness on feet: Secondary | ICD-10-CM

## 2022-06-13 DIAGNOSIS — M6281 Muscle weakness (generalized): Secondary | ICD-10-CM

## 2022-06-13 DIAGNOSIS — I69354 Hemiplegia and hemiparesis following cerebral infarction affecting left non-dominant side: Secondary | ICD-10-CM

## 2022-06-13 NOTE — Therapy (Signed)
OUTPATIENT OCCUPATIONAL THERAPY TREATMENT NOTE & Discharge   Patient Name: Dennis Mercado MRN: [id number] DOB:03/14/1981, 41 y.o., male Today's Date: 06/13/2022  PCP: Libby Maw, MD  REFERRING PROVIDER: Alysia Penna   OCCUPATIONAL THERAPY DISCHARGE SUMMARY  Visits from Start of Care: 13  Current functional level related to goals / functional outcomes: Pt completed both in clinic and in pool OT sessions to focus on appropriate stretching, strengthening, and pool exercises that he could complete on his own.  Pt has demonstrated improvements in PROM and AROM of shoulder and elbow, demonstrating independence with pool exercises and stretches.  Pt has also been introduced to HEP with use of weight stack in gym.  Pt has been offered availability of clinicians at [facility] to assist with setup and to ensure proper positioning/technique with exercises.     Remaining deficits: Pt continues to demonstrate internal rotation and pronation limiting functional reach as well as spasticity in wrist impacting grasp/release.  Compensatory trunk movements during resistive exercises.   Education / Equipment: [employer] HEP, aquatic HEP   Patient agrees to discharge. Patient goals were partially met. Patient is being discharged due to being pleased with the current functional level..     END OF SESSION:   OT End of Session - 06/13/22 0811     Visit Number 13    Number of Visits 17    Date for OT Re-Evaluation 06/14/22    Authorization Type [employer]    Authorization Time Period per patient 30 visit OT limit    OT Start Time 0801    OT Stop Time 0845    OT Time Calculation (min) 44 min    Activity Tolerance Patient tolerated treatment well    Behavior During Therapy [facility] for tasks assessed/performed                   No past medical history on file. Past Surgical History:  Procedure Laterality Date   WISDOM TOOTH EXTRACTION     Patient Active Problem  List   Diagnosis Date Noted   Screening for prostate cancer 12/20/2021   Grieving 12/20/2021   Prediabetes 06/27/2021   Elevated glucose 10/13/2020   Mixed hyperlipidemia 10/05/2020   History of CVA (cerebrovascular accident) 01/19/2020   Normocytic anemia    Benign essential HTN    Hyponatremia    Acute blood loss anemia    Hypotension due to drugs    Vascular headache    Prerenal azotemia    Sleep disturbance    Leukocytosis    Transaminitis    Marijuana abuse    Essential hypertension    Dysphagia, post-stroke    Left hemiparesis (HCC)    Acute respiratory failure (HCC)    ICH (intracerebral hemorrhage) ([place]) 12/06/2019    ONSET DATE: 12/06/19 (recent referral: 04/09/22)   REFERRING DIAG: G81.14 (ICD-10-CM) - Left spastic hemiplegia   THERAPY DIAG:  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 presented to ED on 12/06/2019 with left side weakness and altered mental status. 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 Received inpatient rehab; was discharged 01/12/20. Pt returns to OT after botox on 02/05/22 by Dr. Letta Pate; HTN, left inattention, apraxia    PRECAUTIONS: Fall  SUBJECTIVE: "I didn't have time to  stretch this morning." PAIN:  Are you having pain? No   OBJECTIVE:   Today's Treatment:   06/13/22 Pt seen for treatment in clinic setting. Supine lengthening: engaged in snow angel and backstroke while in supine with focus on lengthening LUE at shoulder and elbow. OT providing cues to visually attend to LUE, especially when in "plane" position with arms extended to side to increase elbow extension. OT applied 1# wrist weight to LUE to further facilitate  increased stretch/lengthening.  Shoulder flexion: 142* in sitting, however pt continues to demonstrate internal rotation and forearm pronation to achieve increased shoulder ROM - limiting functional reach. Wrist flexion: -24* PROM/stretching: reviewed forearm supination/pronation stretch, wrist extension stretch with modified prayer stretch.  OT demonstrating use of cane for shoulder external rotation.    06/06/22 Pt seen for treatment in clinic setting.  Pt brought in gripping aid/hand support to maintain grasp on handle with weight stack.  Trialed setup during session to carry over to use at the gym.  Pt requiring increased time and min cues for trouble shooting due to only being able to utilize RUE to don over L hand.   NMR: Engaged in Single Arm Row, Single Arm Shoulder Extension, and Standing High Row with resistance on weight stack.  OT providing min tactile cues and mod demonstration and verbal cues for improved technique as pt with tendency to drop L shoulder and use trunk to compensate for decreased UE ROM.  OT aiding in last portion of shoulder extension to hip for increased ROM and strengthening.  Utilized 5# resistance with pt able to demonstrate good technique with use of tactile and verbal cues as well as intermittent tactile cues for improved positioning and technique.  Pt with decreased supination to neutral and recommended for single arm row and standing high row.   06/03/22 Aquatic therapy session as indicated prior.  On land reviewed written aquatic therapy exercise program.   In water demonstrated. Practiced and reviewed exercise program.  Warm up 10 min water walking forward, sidestep, and backward.  Reviewed points of emphasis - posture. Step width and length, arm lengthening, and balance.  Complete in 4.5 ft of water.  Patient able to safely complete without physical assistance Arm stretch on railing:  Stretches for elbow extension and shoulder flexion.  Cueing patient to be  close to left rail initially to allow hand onto rail, then adjust body accordingly.   Balance/Arm Work:  Demonstrated and practiced with hand bells exercise near wall - but not on wall.  Shoulder flex/ext bilaterally and bimanually, then horizontal abd/adduction, and bicep curls.  Completed first with feet shoulder width apart, then in stride R foot forward, then left foot forward.   Reviewed technique for safe components of swimming with pool noodle.   Reviewed process to obtain any needed pool equipment from lifeguard.   Patient pleased with progress and has completed the OT  aquatics component of this episode of care.      Exercise Program: Access Code: [email or web address] URL: [email or web address] Date: 05/30/2022 Prepared by: [facility]  Exercises - Single Arm Row with Trunk Rotation  - 1 x daily - 3 x weekly - 2 sets - 10 reps - Single Arm Shoulder Extension with Anchored Resistance  - 1 x daily - 3 x weekly - 2 sets - 10 reps - Standing Diagonal Chop  - 1 x daily - 3 x weekly - 2 sets - 10 reps - Standing High Row with  Anchored Resistance  - 1 x daily - 3 x weekly - 2 sets - 10 reps - Seated Shoulder External Rotation AAROM with Cane and Hand in Neutral  - 2 x daily - 5 x weekly - 2 sets - 10 reps    GOALS: Goals reviewed with patient? Yes   SHORT TERM GOALS: Target date: 05/17/22   Pt will be independent with updated HEP. Baseline: Goal status: Met 05/16/22   2.  Patient will incorporate self stretching techniques throughout the day whether at home or work              Baseline:             Goal status: Met 05/16/22   3.  Pt will demonstrate A/ROM wrist extension to -10 for improved functional use.  Baseline: -32* Goal status: On going   4.  Pt will verbalize understanding of task modifications and/or potential AE needs to increase ease, safety, and independence w/ ADLs (rocker knife, adaptive cutting board, etc.) Baseline:   Goal status: On going     LONG TERM GOALS: Target date: 06/14/22   Pt will use LUE as a stabilizer/gross assist at least 50% of the time.  Baseline:  Goal status: Met - 06/13/22   2.  Pt will demonstrate ability to actively release an item than has been placed in left hand 50% of the time.  Baseline:  Goal status: Not met - 06/13/22   3.  Pt will demonstrate at least 80% A/ROM foream supination following stretching in prep for improved functional use.              Baseline: Goal status: Not met - 06/13/22   4. Patient will reach to hit target at at least 120 degrees of shoulder flexion while upright with min compensatory movement/strategies.             Baseline:             Goal status: Met - 06/13/22   5.  Patient will engage in aquatic therapy level exercises to increase independence with leisure pursuits of accessing pool for self-guided exercises.  Baseline:  Goal status: Met - 06/03/22     ASSESSMENT:   CLINICAL IMPRESSION: Patient showing improved active and passive range of motion at shoulder and elbow, however continues to demonstrate shoulder internal rotation as well as pronation with engaging in overhead shoulder ROM.  Pt continues to compensate with trunk rotation, L shoulder internal rotation, and shoulder drop, however demonstrating carryover and ability to recognize compensatory movements and attempt to correct with decreased cues.  Pt continues to demonstrate decreased supination, even to neutral position as needed for best positioning for above exercises.  Pt asking questions about "Stretch Zone" and whether that would be appropriate for UE stretching.  He states that he went for LB stretching and that the person stretching him requested approval from OT or MD prior to stretching UB.  OT discussed with pt decreased familiarity with Stretch Zone but would research and reach out to other OTs to assess appropriateness of Stretch Zone post CVA.  Reiterated stretching  exercises and encouraged pt to continue stretching HEP.   PERFORMANCE DEFICITS: in functional skills including ADLs, IADLs, dexterity, tone, ROM, strength, flexibility, Gross motor control, body mechanics, decreased knowledge of use of DME, and UE functional use and psychosocial skills including environmental adaptation.    IMPAIRMENTS: are limiting patient from ADLs, IADLs, and leisure.    CO-MORBIDITIES: may have co-morbidities  that affects occupational performance. Patient will benefit from skilled OT to address above impairments and improve overall function.   MODIFICATION OR ASSISTANCE TO COMPLETE EVALUATION: Min-Moderate modification of tasks or assist with assess necessary to complete an evaluation.   OT OCCUPATIONAL PROFILE AND HISTORY: Problem focused assessment: Including review of records relating to presenting problem.   CLINICAL DECISION MAKING: Moderate - several treatment options, min-mod task modification necessary   REHAB POTENTIAL: Good   EVALUATION COMPLEXITY: Low       PLAN:   OT FREQUENCY: 1-2x/week   OT DURATION: 8 weeks   PLANNED INTERVENTIONS: self care/ADL training, therapeutic exercise, therapeutic activity, neuromuscular re-education, manual therapy, passive range of motion, aquatic therapy, electrical stimulation, ultrasound, compression bandaging, moist heat, cryotherapy, patient/family education, energy conservation, coping strategies training, and DME and/or AE instructions   RECOMMENDED OTHER SERVICES: aquatic therapy   CONSULTED AND AGREED WITH PLAN OF CARE: Patient   PLAN FOR NEXT SESSION: D/c   Simonne Come, OT 06/13/2022, 8:13 AM

## 2022-06-20 ENCOUNTER — Ambulatory Visit: Payer: Managed Care, Other (non HMO) | Attending: Physical Medicine & Rehabilitation | Admitting: Physical Therapy

## 2022-06-20 DIAGNOSIS — I69354 Hemiplegia and hemiparesis following cerebral infarction affecting left non-dominant side: Secondary | ICD-10-CM

## 2022-06-20 DIAGNOSIS — R2689 Other abnormalities of gait and mobility: Secondary | ICD-10-CM | POA: Diagnosis present

## 2022-06-20 DIAGNOSIS — Z8673 Personal history of transient ischemic attack (TIA), and cerebral infarction without residual deficits: Secondary | ICD-10-CM | POA: Insufficient documentation

## 2022-06-20 DIAGNOSIS — G8114 Spastic hemiplegia affecting left nondominant side: Secondary | ICD-10-CM | POA: Diagnosis not present

## 2022-06-20 DIAGNOSIS — M6281 Muscle weakness (generalized): Secondary | ICD-10-CM | POA: Diagnosis present

## 2022-06-20 DIAGNOSIS — R2681 Unsteadiness on feet: Secondary | ICD-10-CM

## 2022-06-20 NOTE — Therapy (Signed)
OUTPATIENT PHYSICAL THERAPY NEURO EVALUATION   Patient Name: Dennis Mercado MRN: [id number] DOB:[birth year], 42 y.o., male Today's Date: 06/21/2022   PCP: Libby Maw, MD REFERRING PROVIDER: [name], MD  END OF SESSION:  PT End of Session - 06/21/22 1519     Visit Number 1    Number of Visits 6   authorization for 5 visits only   Date for PT Re-Evaluation 08/09/22   extended date due to delay in scheduling   Authorization Type [employer]    Authorization - Number of Visits 5    PT Start Time 0932    PT Stop Time 1016    PT Time Calculation (min) 44 min    Activity Tolerance Patient tolerated treatment well    Behavior During Therapy [facility] for tasks assessed/performed             History reviewed. No pertinent past medical history. Past Surgical History:  Procedure Laterality Date   WISDOM TOOTH EXTRACTION     Patient Active Problem List   Diagnosis Date Noted   Screening for prostate cancer 12/20/2021   Grieving 12/20/2021   Prediabetes 06/27/2021   Elevated glucose 10/13/2020   Mixed hyperlipidemia 10/05/2020   History of CVA (cerebrovascular accident) 01/19/2020   Normocytic anemia    Benign essential HTN    Hyponatremia    Acute blood loss anemia    Hypotension due to drugs    Vascular headache    Prerenal azotemia    Sleep disturbance    Leukocytosis    Transaminitis    Marijuana abuse    Essential hypertension    Dysphagia, post-stroke    Left hemiparesis (HCC)    Acute respiratory failure (HCC)    ICH (intracerebral hemorrhage) ([place]) 12/06/2019    ONSET DATE: June 2021  REFERRING DIAG:   Diagnosis  G81.14 (ICD-10-CM) - Left spastic hemiplegia ([place])  Z86.73 (ICD-10-CM) - History of CVA (cerebrovascular accident)    THERAPY DIAG:  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)  Muscle weakness (generalized)  Unsteadiness on feet  Other abnormalities of gait and mobility  Rationale  for Evaluation and Treatment: Rehabilitation  SUBJECTIVE:                                                                                                                                                                                             SUBJECTIVE STATEMENT:  Pt is a 42 yr old male s/p Rt thalamic hemorrhage on 12-06-19: pt has spastic Lt hemiparesis.  Pt presents to PT for evaluation for aquatic PT;  pt reports he just  finished up with OT - aquatic and land therapy sessions.  Pt reports he is a member of [employer] and wants to learn some aquatic exercises for his Lt leg that he can do on his own.  Pt not using device at today's evaluation - reports he is not using cane in the home - is trying to use the cane less in the community.  Pt also states he is no longer wearing the AFO for his LLE.    Pt received OP PT at this facility from 01-17-20 - 03-13-21 (27 visits) and again from 08-02-21 - 12-03-21 for 18 more visits.   Pt accompanied by: self  PERTINENT HISTORY: Patient is a 42 year old male referred to Neuro OPPT for left spastic hemiplegia.   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   PAIN:  Are you having pain? No  PRECAUTIONS: None  WEIGHT BEARING RESTRICTIONS: No  FALLS: Has patient fallen in last 6 months? No  LIVING ENVIRONMENT: Lives with: lives with their spouse Lives in: House/apartment Stairs: No Has following equipment at home: Single point cane  PLOF: Independent prior to CVA  PATIENT GOALS: be able to do aquatic exercise program independently at [facility] for aquatic exercise - wants to walk in lap pool at [place]:   DIAGNOSTIC FINDINGS: None since CT scan was done in July 2021  COGNITION: Overall cognitive status: Within functional limits for tasks assessed   SENSATION: Not tested  COORDINATION: Decreased LLE due to spasticity and decreased AROM & strength  POSTURE: No Significant postural limitations; Lt wrist  in flexion due to spasticity -   LOWER EXTREMITY ROM:   RLE  WNL's;  LLE WFL's except for Lt ankle ROM which is decreased due to weakness and increased tone   LOWER EXTREMITY MMT:    MMT Right Eval Left Eval  Hip flexion  4  Hip extension    Hip abduction    Hip adduction    Hip internal rotation    Hip external rotation    Knee flexion  3-  Knee extension  5  Ankle dorsiflexion  3-  Ankle plantarflexion    Ankle inversion    Ankle eversion    (Blank rows = not tested)  BED MOBILITY:  Independent  TRANSFERS: Assistive device utilized: None  Sit to stand: Modified independence Stand to sit: Modified independence   STAIRS: Level of Assistance: Modified independence Stair Negotiation Technique: Forwards with Single Rail on Right  GAIT: Gait pattern: step through pattern, decreased arm swing- Left, decreased step length- Left, and decreased ankle dorsiflexion- Left; Distance walked: 100' Assistive device utilized:  None Level of assistance: Modified independence Comments: slight supination of Lt foot noted in swing phase of gait; pt is not wearing AFO on LLE  FUNCTIONAL TESTS:  5 times sit to stand: 14.06 secs without UE support from standard chair Timed up and go (TUG): 14.41 secs without device 10 meter walk test: 2.76 ft/sec without device = 11.87 secs  Pt is unable to perform SLS on LLE  PATIENT SURVEYS:  FOTO N/A due to CVA > 2 yrs ago  TODAY'S TREATMENT:  DATE: N/A - eval only for aquatic therapy    PATIENT EDUCATION Education details: eval results - plan for 5 visits aquatic PT (insurance only authorizing 5 visits initially) Person educated: Patient Education method: Explanation Education comprehension: verbalized understanding  HOME EXERCISE PROGRAM: Aquatic HEP to be established   GOALS: Goals reviewed with patient?  Yes   LONG TERM GOALS: Target date: 08-09-22 (5 visits initially authorized)  Pt will be independent in aquatic exercise program, including walking in lap pool to be continued at [place] (pt is currently a member) upon discharge from PT.   Baseline: to be established Goal status: INITIAL  2.  Pt will demonstrate improved SLS on LLE so pt able to stand on Lt leg for at least 5 secs without UE support in 4' water depth to demo improved balance.  Baseline: currently unable to perform SLS on LLE on land Goal status: INITIAL  3.  Pt will demonstrate improved coordination and strength of LLE so he is able to "jog" across width of pool x 2 reps without LOB (18' x 2 = 36').  Baseline:  Goal status: INITIAL  4.  Pt will subjectively report ability to amb. In community without use of SPC at least 25% time.  Baseline:  Goal status: INITIAL ASSESSMENT:  CLINICAL IMPRESSION: Patient is a 42 y.o. gentleman who was seen today for physical therapy evaluation and treatment for Lt spastic hemiparesis due to s/p Rt thalamic hemorrhage on 12-06-19.  Pt seen for PT evaluation for aquatic PT only as pt has had extensive land PT at this facility in the past with most recent D/C from PT on 12-03-21.  Pt presents with gait and balance deficits due to Lt spastic hemiparesis.  Pt is currently not wearing AFO on his LLE (has discontinued use of orthosis) and is currently not using SPC for assistance with amb. At today's eval.  Pt will benefit from aquatic PT to address gait and balance deficits and LLE weakness, spasticity and decreased coordination.    OBJECTIVE IMPAIRMENTS: Abnormal gait, decreased balance, decreased coordination, decreased ROM, and decreased strength.   ACTIVITY LIMITATIONS: bending, squatting, and locomotion level  PARTICIPATION LIMITATIONS: community activity  PERSONAL FACTORS: 1 comorbidity: Lt hemiparesis due to Rt CVA  are also affecting patient's functional outcome.   REHAB POTENTIAL:  Good  CLINICAL DECISION MAKING: Stable/uncomplicated  EVALUATION COMPLEXITY: Low  PLAN:  PT FREQUENCY: 1x/week  PT DURATION: other: 5 weeks  PLANNED INTERVENTIONS: Aquatic Therapy  PLAN FOR NEXT SESSION: begin aquatic therapy 07-02-22   Alda Lea, PT 06/21/2022, 3:22 PM

## 2022-06-21 ENCOUNTER — Encounter: Payer: Self-pay | Admitting: Physical Therapy

## 2022-06-27 ENCOUNTER — Encounter: Payer: Self-pay | Admitting: Family Medicine

## 2022-06-27 ENCOUNTER — Ambulatory Visit: Payer: Managed Care, Other (non HMO) | Admitting: Family Medicine

## 2022-06-27 VITALS — BP 124/86 | HR 83 | Temp 98.3°F | Ht 67.0 in | Wt 160.2 lb

## 2022-06-27 DIAGNOSIS — I10 Essential (primary) hypertension: Secondary | ICD-10-CM | POA: Diagnosis not present

## 2022-06-27 DIAGNOSIS — Z8673 Personal history of transient ischemic attack (TIA), and cerebral infarction without residual deficits: Secondary | ICD-10-CM

## 2022-06-27 DIAGNOSIS — R7303 Prediabetes: Secondary | ICD-10-CM | POA: Diagnosis not present

## 2022-06-27 DIAGNOSIS — E782 Mixed hyperlipidemia: Secondary | ICD-10-CM | POA: Diagnosis not present

## 2022-06-27 MED ORDER — LISINOPRIL 10 MG PO TABS: 10.0000 mg | ORAL_TABLET | Freq: Every day | ORAL | 3 refills | Status: DC

## 2022-06-27 NOTE — Progress Notes (Signed)
Established Patient Office Visit   Subjective:  Patient ID: Dennis Mercado, male    DOB: 1980-12-15  Age: 42 y.o. MRN: [id number]  Chief Complaint  Patient presents with   Follow-up    6 month follow up, no concerns. Patient not fasting.     HPI Encounter Diagnoses  Name Primary?   Prediabetes Yes   Benign essential HTN    Mixed hyperlipidemia    History of CVA (cerebrovascular accident)    For follow-up of hypertension, mixed hyperlipidemia and prediabetes.  History of CVA.  Continues working with physical therapy.  Slightly raising.  Has recently joined the gym.  He is starting to walk without his cane.  Blood pressures at home have been in the 120s to 130s over 70s to 80s.  Several diastolic pressures have been up into the 80s.   Review of Systems  Constitutional: Negative.   HENT: Negative.    Eyes:  Negative for blurred vision, discharge and redness.  Respiratory: Negative.    Cardiovascular: Negative.   Gastrointestinal:  Negative for abdominal pain.  Genitourinary: Negative.   Musculoskeletal: Negative.  Negative for myalgias.  Skin:  Negative for rash.  Neurological:  Negative for tingling, loss of consciousness and weakness.  Endo/Heme/Allergies:  Negative for polydipsia.     Current Outpatient Medications:    amLODipine (NORVASC) 10 MG tablet, TAKE 1 TABLET BY MOUTH DAILY, Disp: 90 tablet, Rfl: 1   atorvastatin (LIPITOR) 40 MG tablet, TAKE 1 TABLET BY MOUTH DAILY, Disp: 90 tablet, Rfl: 1   botulinum toxin Type A (BOTOX) 200 units injection, Inject 400 Units into the muscle every 3 (three) months., Disp: 2 each, Rfl: 3   carvedilol (COREG) 25 MG tablet, TAKE 1 TABLET(25 MG) BY MOUTH TWICE DAILY WITH A MEAL, Disp: 60 tablet, Rfl: 3   DYSPORT 500 units SOLR injection, INJECT LEFT BICEPS 500 UNITS, FPL 100 UNITS, FCR 300 UNITS, PALMARIS LONGUS 100 UNITS, FDS 200 UNITS, FDP 300 UNITS. AS DIRECTED TO BE ADMINISTERED IN PRESCRIBERS OFFICE EVERY 3 MONTHS AS DIRECTED,  Disp: 3 each, Rfl: 0   lisinopril (ZESTRIL) 10 MG tablet, Take 1 tablet (10 mg total) by mouth daily., Disp: 90 tablet, Rfl: 3   tadalafil (CIALIS) 5 MG tablet, Take 5 mg by mouth daily as needed., Disp: , Rfl:    Objective:     BP 124/86 (BP Location: Right Arm, Patient Position: Sitting, Cuff Size: Normal)   Pulse 83   Temp 98.3 F (36.8 C) (Temporal)   Ht '5\' 7"'$  (1.702 m)   Wt 160 lb 3.2 oz (72.7 kg)   SpO2 100%   BMI 25.09 kg/m  BP Readings from Last 3 Encounters:  06/27/22 124/86  06/11/22 136/87  04/30/22 133/86   Wt Readings from Last 3 Encounters:  06/27/22 160 lb 3.2 oz (72.7 kg)  06/11/22 163 lb (73.9 kg)  04/30/22 159 lb 9.6 oz (72.4 kg)      Physical Exam Constitutional:      General: He is not in acute distress.    Appearance: Normal appearance. He is not ill-appearing, toxic-appearing or diaphoretic.  HENT:     Head: Normocephalic and atraumatic.     Right Ear: External ear normal.     Left Ear: External ear normal.     Mouth/Throat:     Mouth: Mucous membranes are moist.     Pharynx: Oropharynx is clear. No oropharyngeal exudate or posterior oropharyngeal erythema.  Eyes:     General: No scleral icterus.  Right eye: No discharge.        Left eye: No discharge.     Extraocular Movements: Extraocular movements intact.     Conjunctiva/sclera: Conjunctivae normal.     Pupils: Pupils are equal, round, and reactive to light.  Cardiovascular:     Rate and Rhythm: Normal rate and regular rhythm.  Pulmonary:     Effort: Pulmonary effort is normal. No respiratory distress.     Breath sounds: Normal breath sounds.  Abdominal:     General: Bowel sounds are normal.  Musculoskeletal:     Cervical back: No rigidity or tenderness.  Skin:    General: Skin is warm and dry.  Neurological:     Mental Status: He is alert and oriented to person, place, and time.  Psychiatric:        Mood and Affect: Mood normal.        Behavior: Behavior normal.      No  results found for any visits on 06/27/22.    The ASCVD Risk score (Arnett DK, et al., 2019) failed to calculate for the following reasons:   The valid total cholesterol range is 130 to 320 mg/dL    Assessment & Plan:   Prediabetes -     Hemoglobin A1c; Future -     Microalbumin / creatinine urine ratio; Future -     Comprehensive metabolic panel; Future  Benign essential HTN -     CBC; Future -     Urinalysis, Routine w reflex microscopic; Future -     Microalbumin / creatinine urine ratio; Future -     Comprehensive metabolic panel; Future -     Lisinopril; Take 1 tablet (10 mg total) by mouth daily.  Dispense: 90 tablet; Refill: 3  Mixed hyperlipidemia -     Lipid panel; Future -     Comprehensive metabolic panel; Future  History of CVA (cerebrovascular accident)    Return in about 3 months (around 09/26/2022).  Have increased lisinopril to 10 mg for better BP control.  Continue healthy active lifestyle.  Return fasting for above ordered blood work.  Information was given on managing hypertension.  He asked about transfer to [place] or [place].  His clinics are closer to home.  I told him that I thought that would be okay.  Libby Maw, MD

## 2022-07-02 ENCOUNTER — Ambulatory Visit: Payer: Managed Care, Other (non HMO) | Admitting: Rehabilitation

## 2022-07-09 ENCOUNTER — Other Ambulatory Visit (INDEPENDENT_AMBULATORY_CARE_PROVIDER_SITE_OTHER): Payer: Managed Care, Other (non HMO)

## 2022-07-09 ENCOUNTER — Telehealth: Payer: Self-pay | Admitting: Family Medicine

## 2022-07-09 DIAGNOSIS — R7303 Prediabetes: Secondary | ICD-10-CM

## 2022-07-09 DIAGNOSIS — E782 Mixed hyperlipidemia: Secondary | ICD-10-CM

## 2022-07-09 DIAGNOSIS — I10 Essential (primary) hypertension: Secondary | ICD-10-CM

## 2022-07-09 NOTE — Telephone Encounter (Signed)
  _X__ Form received and placed in providers office for signature. ___ Form completed and faxed to LOA Dept.  ___ Form completed & LVM to notify patient ready for pick up.  ___ Charge sheet and copy of form in front office folder for office supervisor.

## 2022-07-09 NOTE — Telephone Encounter (Signed)
PAPERWORK/FORMS received  Dropped off by: pt Call back #: Phone: [phone]  Individual made aware of 3-5 business day turn around (YES/NO): yes GREEN charge sheet completed and patient made aware of possible charge (YES/NO): yes Placed in provider folder at front desk. ~~~ route to CMA/provider Team  CLINICAL USE BELOW THIS LINE (use X to signify action taken)  ___ Form received and placed in providers office for signature. ___ Form completed and faxed to LOA Dept.  ___ Form completed & LVM to notify patient ready for pick up.  ___ Charge sheet and copy of form in front office folder for office supervisor.

## 2022-07-11 LAB — CBC
HCT: 42.9 % (ref 38.5–50.0)
Hemoglobin: 14.3 g/dL (ref 13.2–17.1)
MCH: 26.8 pg — ABNORMAL LOW (ref 27.0–33.0)
MCHC: 33.3 g/dL (ref 32.0–36.0)
MCV: 80.3 fL (ref 80.0–100.0)
MPV: 10 fL (ref 7.5–12.5)
Platelets: 280 10*3/uL (ref 140–400)
RBC: 5.34 10*6/uL (ref 4.20–5.80)
RDW: 13.2 % (ref 11.0–15.0)
WBC: 5.9 10*3/uL (ref 3.8–10.8)

## 2022-07-11 LAB — COMPREHENSIVE METABOLIC PANEL
AG Ratio: 1.9 (calc) (ref 1.0–2.5)
ALT: 18 U/L (ref 9–46)
AST: 15 U/L (ref 10–40)
Albumin: 4.5 g/dL (ref 3.6–5.1)
Alkaline phosphatase (APISO): 44 U/L (ref 36–130)
BUN: 13 mg/dL (ref 7–25)
CO2: 30 mmol/L (ref 20–32)
Calcium: 9.3 mg/dL (ref 8.6–10.3)
Chloride: 102 mmol/L (ref 98–110)
Creat: 1.17 mg/dL (ref 0.60–1.29)
Globulin: 2.4 g/dL (calc) (ref 1.9–3.7)
Glucose, Bld: 94 mg/dL (ref 65–99)
Potassium: 4.3 mmol/L (ref 3.5–5.3)
Sodium: 139 mmol/L (ref 135–146)
Total Bilirubin: 0.7 mg/dL (ref 0.2–1.2)
Total Protein: 6.9 g/dL (ref 6.1–8.1)

## 2022-07-11 LAB — MICROALBUMIN / CREATININE URINE RATIO
Creatinine, Urine: 118 mg/dL (ref 20–320)
Microalb Creat Ratio: 6 mcg/mg creat (ref <30)
Microalb, Ur: 0.7 mg/dL

## 2022-07-11 LAB — LIPID PANEL
Cholesterol: 110 mg/dL (ref <200)
HDL: 39 mg/dL — ABNORMAL LOW (ref >=40)
LDL Cholesterol (Calc): 58 mg/dL (calc)
Non-HDL Cholesterol (Calc): 71 mg/dL (calc) (ref <130)
Total CHOL/HDL Ratio: 2.8 (calc) (ref <5.0)
Triglycerides: 58 mg/dL (ref <150)

## 2022-07-11 LAB — HEMOGLOBIN A1C
Hgb A1c MFr Bld: 6.1 % of total Hgb — ABNORMAL HIGH (ref <5.7)
Mean Plasma Glucose: 128 mg/dL
eAG (mmol/L): 7.1 mmol/L

## 2022-07-11 LAB — URINALYSIS, ROUTINE W REFLEX MICROSCOPIC

## 2022-07-11 NOTE — Telephone Encounter (Signed)
X__ Form received and placed in providers office for signature. _X__ Form completed and faxed to LOA Dept.  ___ Form completed & LVM to notify patient ready for pick up.  ___ Charge sheet and copy of form in front office folder for office supervisor.

## 2022-07-13 ENCOUNTER — Other Ambulatory Visit: Payer: Self-pay | Admitting: Family Medicine

## 2022-07-13 DIAGNOSIS — I10 Essential (primary) hypertension: Secondary | ICD-10-CM

## 2022-07-15 ENCOUNTER — Telehealth: Payer: Self-pay | Admitting: Family Medicine

## 2022-07-15 DIAGNOSIS — I10 Essential (primary) hypertension: Secondary | ICD-10-CM

## 2022-07-15 DIAGNOSIS — E1169 Type 2 diabetes mellitus with other specified complication: Secondary | ICD-10-CM

## 2022-07-15 MED ORDER — CARVEDILOL 25 MG PO TABS: ORAL_TABLET | ORAL | 3 refills | Status: DC

## 2022-07-15 MED ORDER — ATORVASTATIN CALCIUM 40 MG PO TABS: 40.0000 mg | ORAL_TABLET | Freq: Every day | ORAL | 1 refills | Status: DC

## 2022-07-15 NOTE — Telephone Encounter (Signed)
Caller Name: Dennis Mercado Call back phone #: [phone]   MEDICATION(S):  Atorvastain carvedilol  Days of Med Remaining: 5  Has the patient contacted their pharmacy (YES/NO)? no What did pharmacy advise?   Preferred Pharmacy:  [facility] [address]  ~~~Please advise patient/caregiver to allow 2-3 business days to process RX refills.

## 2022-07-16 ENCOUNTER — Ambulatory Visit: Payer: Managed Care, Other (non HMO) | Admitting: Rehabilitation

## 2022-07-16 ENCOUNTER — Encounter: Payer: Self-pay | Admitting: Rehabilitation

## 2022-07-16 DIAGNOSIS — I69354 Hemiplegia and hemiparesis following cerebral infarction affecting left non-dominant side: Secondary | ICD-10-CM

## 2022-07-16 DIAGNOSIS — M6281 Muscle weakness (generalized): Secondary | ICD-10-CM

## 2022-07-16 DIAGNOSIS — R2689 Other abnormalities of gait and mobility: Secondary | ICD-10-CM

## 2022-07-16 DIAGNOSIS — R2681 Unsteadiness on feet: Secondary | ICD-10-CM

## 2022-07-16 NOTE — Therapy (Signed)
OUTPATIENT PHYSICAL THERAPY NEURO TREATMENT   Patient Name: Dennis Mercado MRN: [id number] DOB:[birth year], 42 y.o., male Today's Date: 07/16/2022   PCP: Libby Maw, MD REFERRING PROVIDER: [name], MD  END OF SESSION:  PT End of Session - 07/16/22 0737     Visit Number 2    Number of Visits 6   authorization for 5 visits only   Date for PT Re-Evaluation 08/09/22   extended date due to delay in scheduling   Authorization Type [employer]    Authorization - Visit Number 1    Authorization - Number of Visits 5    PT Start Time 0845    PT Stop Time 0930    PT Time Calculation (min) 45 min    Activity Tolerance Patient tolerated treatment well    Behavior During Therapy [facility] for tasks assessed/performed             History reviewed. No pertinent past medical history. Past Surgical History:  Procedure Laterality Date   WISDOM TOOTH EXTRACTION     Patient Active Problem List   Diagnosis Date Noted   Screening for prostate cancer 12/20/2021   Grieving 12/20/2021   Prediabetes 06/27/2021   Mixed hyperlipidemia 10/05/2020   History of CVA (cerebrovascular accident) 01/19/2020   Normocytic anemia    Benign essential HTN    Hyponatremia    Acute blood loss anemia    Hypotension due to drugs    Vascular headache    Prerenal azotemia    Sleep disturbance    Leukocytosis    Transaminitis    Marijuana abuse    Essential hypertension    Dysphagia, post-stroke    Left hemiparesis (HCC)    Acute respiratory failure (HCC)    ICH (intracerebral hemorrhage) ([place]) 12/06/2019    ONSET DATE: June 2021  REFERRING DIAG:   Diagnosis  G81.14 (ICD-10-CM) - Left spastic hemiplegia ([place])  Z86.73 (ICD-10-CM) - History of CVA (cerebrovascular accident)    THERAPY DIAG:  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  Excited to see what PT can do in the pool.  Pt received OP PT at this facility from 01-17-20 - 03-13-21 (27  visits) and again from 08-02-21 - 12-03-21 for 18 more visits.   Pt accompanied by: self  PERTINENT HISTORY: Patient is a 42 year old male referred to Neuro OPPT for left spastic hemiplegia.   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   PAIN:  Are you having pain? No  PRECAUTIONS: None  WEIGHT BEARING RESTRICTIONS: No  FALLS: Has patient fallen in last 6 months? No  LIVING ENVIRONMENT: Lives with: lives with their spouse Lives in: House/apartment Stairs: No Has following equipment at home: Single point cane  PLOF: Independent prior to CVA  PATIENT GOALS: be able to do aquatic exercise program independently at [facility] for aquatic [facility] - wants to walk in lap pool at [place]:   DIAGNOSTIC FINDINGS: None since CT scan was done in July 2021  COGNITION: Overall cognitive status: Within functional limits for tasks assessed   SENSATION: Not tested  COORDINATION: Decreased LLE due to spasticity and decreased AROM & strength  POSTURE: No Significant postural limitations; Lt wrist in flexion due to spasticity -   LOWER EXTREMITY ROM:   RLE  WNL's;  LLE WFL's except for Lt ankle ROM which is decreased due to weakness and increased tone   LOWER EXTREMITY MMT:    MMT Right Eval Left Eval  Hip flexion  4  Hip extension    Hip abduction    Hip adduction    Hip internal rotation    Hip external rotation    Knee flexion  3-  Knee extension  5  Ankle dorsiflexion  3-  Ankle plantarflexion    Ankle  inversion    Ankle eversion    (Blank rows = not tested)   TODAY'S TREATMENT:                                                                                                                              DATE:    Patient seen for aquatic therapy today.  Treatment took place in water 3.6-4.0 feet deep depending upon activity.  Pt entered and exited the pool via stairs using R rails in alternating fashion.    In approx 4' dept, had pt ambulate approx 16' x 4 laps forwards without support, backwards x 2 laps (same distance), Side stepping x 2 laps, side stepping with squat (moving arms into abd with lateral step/squat and then add when moving legs in add) x 2 laps again without support and cues for technique.  Some difficulty moving LLE into add during task, but improved with cuing.  Ended walking in pool with marching forward with alt UE flex with small dumbbells with min/guard to min A needed initially for proper alignment and increased WB through LLE.     NMR:  Standing near side of pool for UE support as needed; kept LLE in stance moving RLE into hip flex (with knee in ext) x 10 reps, then LLE into ext x 10 reps (  again knee in ext), then moving through full ROM (flex and ext) x 10 reps with emphasis on decreasing amount of support from wall.  R then LLE circles moving clockwise x 10 reps then counter clockwise x 10 reps.  All performed with light to no UE support.  Pt does need more intermittent support when in L LE stance.     Utilized small 4" step in pool with LLE placed on step.   Forward step ups x 10 reps with light support only. Min tactile and verbal cues for more hip extension to allow better alignment and balance.  Then moved to LLE step up with RLE into march x 10 reps.  Did need light support for this task and same cuing as before.  Lateral step ups in each direction x 10 reps with min cues for LLE placement.    For core strengthening/stabilization:  Utilized yellow pool noodle in "saddle  position" first with PT providing min/mod A initially to min/guard.  Had to play around with depth but ended up in deeper end of pool ('4\' 8"'$ ) so that he could let LEs relax in float position.  Pt needing min support from side of pool at all times.  Moved LEs into knee flex/ext x 10 reps for more strengthening.  Attempted in "swing position" however this was very difficult, pt needing mod to max A to maintain position here.    Ended session with jogging x 18' x 4 laps with PT providing light assist initially to no assist with cues for improved L knee flex.  Pt tolerated well.      Pt requires buoyancy of water for support for reduced fall risk and for unloading/reduced stress on joints (L ankle) as pt able to tolerate increased standing (esp L SLS)  and ambulation in water compared to that on land; viscosity of water is needed for resistance for strengthening and current of water provides perturbations for challenge for balance training     PATIENT EDUCATION Education details: eval results - plan for 5 visits aquatic PT (insurance only authorizing 5 visits initially) Person educated: Patient Education method: Explanation Education comprehension: verbalized understanding  HOME EXERCISE PROGRAM: Aquatic HEP to be established   GOALS: Goals reviewed with patient? Yes   LONG TERM GOALS: Target date: 08-09-22 (5 visits initially authorized)  Pt will be independent in aquatic exercise program, including walking in lap pool to be continued at [facility] (pt is currently a member) upon discharge from PT.   Baseline: to be established Goal status: INITIAL  2.  Pt will demonstrate improved SLS on LLE so pt able to stand on Lt leg for at least 5 secs without UE support in 4' water depth to demo improved balance.  Baseline: currently unable to perform SLS on LLE on land Goal status: INITIAL  3.  Pt will demonstrate improved coordination and strength of LLE so he is able to "jog" across width of pool x  2 reps without LOB (18' x 2 = 36').  Baseline:  Goal status: INITIAL  4.  Pt will subjectively report ability to amb. In community without use of SPC at least 25% time.  Baseline:  Goal status: INITIAL ASSESSMENT:  CLINICAL IMPRESSION: Skilled session in pool to allow buoyancy to assist with decreased fall risk and resistance for strengthening.  Emphasized LLE strengthening in open and closed chain exercises and L SLS.  Pt tolerated session well.     OBJECTIVE IMPAIRMENTS: Abnormal gait, decreased balance, decreased coordination,  decreased ROM, and decreased strength.   ACTIVITY LIMITATIONS: bending, squatting, and locomotion level  PARTICIPATION LIMITATIONS: community activity  PERSONAL FACTORS: 1 comorbidity: Lt hemiparesis due to Rt CVA  are also affecting patient's functional outcome.   REHAB POTENTIAL: Good  CLINICAL DECISION MAKING: Stable/uncomplicated  EVALUATION COMPLEXITY: Low  PLAN:  PT FREQUENCY: 1x/week  PT DURATION: other: 5 weeks  PLANNED INTERVENTIONS: Aquatic Therapy  PLAN FOR NEXT SESSION: begin aquatic therapy 07-02-22  Cameron Sprang, PT, MPT [facility] [address] [place], [place], [address] Phone: [phone]   Fax:  [phone] 07/16/22, 9:51 AM

## 2022-07-30 ENCOUNTER — Encounter: Payer: Self-pay | Admitting: Rehabilitation

## 2022-07-30 ENCOUNTER — Ambulatory Visit: Payer: Managed Care, Other (non HMO) | Attending: Physical Medicine & Rehabilitation | Admitting: Rehabilitation

## 2022-07-30 DIAGNOSIS — I69354 Hemiplegia and hemiparesis following cerebral infarction affecting left non-dominant side: Secondary | ICD-10-CM | POA: Diagnosis present

## 2022-07-30 DIAGNOSIS — R2689 Other abnormalities of gait and mobility: Secondary | ICD-10-CM | POA: Insufficient documentation

## 2022-07-30 DIAGNOSIS — M6281 Muscle weakness (generalized): Secondary | ICD-10-CM | POA: Diagnosis present

## 2022-07-30 DIAGNOSIS — R2681 Unsteadiness on feet: Secondary | ICD-10-CM | POA: Insufficient documentation

## 2022-07-30 NOTE — Therapy (Signed)
OUTPATIENT PHYSICAL THERAPY NEURO TREATMENT   Patient Name: Dennis Mercado MRN: [id number] DOB:[birth year], 42 y.o., male Today's Date: 07/30/2022   PCP: Libby Maw, MD REFERRING PROVIDER: [name], MD  END OF SESSION:  PT End of Session - 07/30/22 0814     Visit Number 3    Number of Visits 6   authorization for 5 visits only   Date for PT Re-Evaluation 08/09/22   extended date due to delay in scheduling   Authorization Type [employer]    Authorization - Visit Number 2    Authorization - Number of Visits 5    PT Start Time [phone]   pt late to session   PT Stop Time 0930    PT Time Calculation (min) 38 min    Activity Tolerance Patient tolerated treatment well    Behavior During Therapy [facility] for tasks assessed/performed             History reviewed. No pertinent past medical history. Past Surgical History:  Procedure Laterality Date   WISDOM TOOTH EXTRACTION     Patient Active Problem List   Diagnosis Date Noted   Screening for prostate cancer 12/20/2021   Grieving 12/20/2021   Prediabetes 06/27/2021   Mixed hyperlipidemia 10/05/2020   History of CVA (cerebrovascular accident) 01/19/2020   Normocytic anemia    Benign essential HTN    Hyponatremia    Acute blood loss anemia    Hypotension due to drugs    Vascular headache    Prerenal azotemia    Sleep disturbance    Leukocytosis    Transaminitis    Marijuana abuse    Essential hypertension    Dysphagia, post-stroke    Left hemiparesis (HCC)    Acute respiratory failure (HCC)    ICH (intracerebral hemorrhage) ([place]) 12/06/2019    ONSET DATE: June 2021  REFERRING DIAG:   Diagnosis  G81.14 (ICD-10-CM) - Left spastic hemiplegia ([place])  Z86.73 (ICD-10-CM) - History of CVA (cerebrovascular accident)    THERAPY DIAG:  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  Discussed aquatic appt next week as he is getting botox on Tuesday.  He reports he  is unable to do Monday or Wednesday due to work.    Pt received OP PT at this facility from 01-17-20 - 03-13-21 (27 visits) and again from 08-02-21 - 12-03-21 for 18 more visits.   Pt accompanied by: self  PERTINENT HISTORY: Patient is a 42 year old male referred to Neuro OPPT for left spastic hemiplegia.   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   PAIN:  Are you having pain? No  PRECAUTIONS: None  WEIGHT BEARING RESTRICTIONS: No  FALLS: Has patient fallen in last 6 months? No  LIVING ENVIRONMENT: Lives with: lives with their spouse Lives in: House/apartment Stairs: No Has following equipment at home: Single point cane  PLOF: Independent prior to CVA  PATIENT GOALS: be able to do aquatic exercise program independently at [facility] for aquatic exercise - wants to walk in lap pool at [place]:   DIAGNOSTIC FINDINGS: None since CT scan was done in July 2021  COGNITION: Overall cognitive status: Within functional limits for tasks assessed   SENSATION: Not tested  COORDINATION: Decreased LLE due to spasticity and decreased AROM & strength  POSTURE: No Significant postural limitations; Lt wrist in flexion due to spasticity -   LOWER EXTREMITY ROM:   RLE  WNL's;  LLE WFL's except for Lt ankle ROM which is decreased due to weakness and increased tone   LOWER EXTREMITY MMT:    MMT Right Eval Left Eval  Hip flexion  4  Hip extension    Hip abduction    Hip adduction    Hip internal rotation    Hip external  rotation    Knee flexion  3-  Knee extension  5  Ankle dorsiflexion  3-  Ankle plantarflexion    Ankle inversion    Ankle eversion    (Blank rows = not tested)   TODAY'S TREATMENT:                                                                                                                              DATE:    Patient seen for aquatic therapy today.  Treatment took place in water 3.6-4.0 feet deep depending upon activity.  Pt entered and exited the pool via stairs using R rails in alternating fashion.    In approx 4' dept, had pt ambulate approx 16' x 2 laps forwards without support, backwards x 2 laps (same distance), Side stepping with squat and moving dumbells (small) from abd to add with LEs x 2 laps with heavy cues at times for not rotating trunk on L.     NMR:  Standing near side of pool: kept LLE in stance moving RLE into hip flex (with knee in ext) and ext x 10 reps (again knee in ext), holding B yellow dumbells for support only.  R then LLE circles moving clockwise x 10 reps then counter clockwise x 10 reps.  All performed with light to no  UE support.  Pt does need more intermittent support when in L LE stance.  Ai chi postures: Enclosing x 10 reps with cues for maintaining squat (doing best he can with LUE).  PT did provide some overpressure to arm as he could tolerate for stretch.  Standing on RLE, moving LLE in bicycle motions for improved knee flexion/hip flexion.  Performed x 20 reps.    Continue to trial saddle sit on yellow noodle for core strengthening and stability.  He is better able to get into position today but demo's very heavy forward lean.  Worked for several minutes getting him to tuck into posterior tilt and more upright posture.  He is able to intermittently achieve position but not able to maintain for more than a few seconds at a time.      Ended session with jogging x 18' x 4 laps with PT providing light assist initially to no assist with cues for improved L  knee flex.  Pt tolerated well.      Pt requires buoyancy of water for support for reduced fall risk and for unloading/reduced stress on joints (L ankle) as pt able to tolerate increased standing (esp L SLS)  and ambulation in water compared to that on land; viscosity of water is needed for resistance for strengthening and current of water provides perturbations for challenge for balance training     PATIENT EDUCATION Education details: eval results - plan for 5 visits aquatic PT (insurance only authorizing 5 visits initially) Person educated: Patient Education method: Explanation Education comprehension: verbalized understanding  HOME EXERCISE PROGRAM: Aquatic HEP to be established   GOALS: Goals reviewed with patient? Yes   LONG TERM GOALS: Target date: 08-09-22 (5 visits initially authorized)  Pt will be independent in aquatic exercise program, including walking in lap pool to be continued at [facility] (pt is currently a member) upon discharge from PT.   Baseline: to be established Goal status: INITIAL  2.  Pt will demonstrate improved SLS on LLE so pt able to stand on Lt leg for at least 5 secs without UE support in 4' water depth to demo improved balance.  Baseline: currently unable to perform SLS on LLE on land Goal status: INITIAL  3.  Pt will demonstrate improved coordination and strength of LLE so he is able to "jog" across width of pool x 2 reps without LOB (18' x 2 = 36').  Baseline:  Goal status: INITIAL  4.  Pt will subjectively report ability to amb. In community without use of SPC at least 25% time.  Baseline:  Goal status: INITIAL ASSESSMENT:  CLINICAL IMPRESSION: Skilled session in pool to allow buoyancy to assist with decreased fall risk and resistance for strengthening.  Emphasized LLE strengthening in open and closed chain exercises and L SLS especially knee and hip flex to break out of extensor tone.  Pt tolerated session well.  Reports that he needs progress  note sent to insurance for more visits.  Will work on this in clinic.    OBJECTIVE IMPAIRMENTS: Abnormal gait, decreased balance, decreased coordination, decreased ROM, and decreased strength.   ACTIVITY LIMITATIONS: bending, squatting, and locomotion level  PARTICIPATION LIMITATIONS: community activity  PERSONAL FACTORS: 1 comorbidity: Lt hemiparesis due to Rt CVA  are also affecting patient's functional outcome.   REHAB POTENTIAL: Good  CLINICAL DECISION MAKING: Stable/uncomplicated  EVALUATION COMPLEXITY: Low  PLAN:  PT FREQUENCY: 1x/week  PT DURATION: other: 5 weeks  PLANNED INTERVENTIONS: Aquatic Therapy  PLAN FOR NEXT  SESSION: begin aquatic therapy 07-02-22  Cameron Sprang, PT, MPT [facility] [address] [place], [place], [address] Phone: [phone]   Fax:  [phone] 07/30/22, 11:08 AM

## 2022-08-06 ENCOUNTER — Ambulatory Visit: Payer: Self-pay | Admitting: Rehabilitation

## 2022-08-06 ENCOUNTER — Encounter: Payer: Managed Care, Other (non HMO) | Admitting: Physical Medicine & Rehabilitation

## 2022-08-07 ENCOUNTER — Telehealth: Payer: Self-pay

## 2022-08-07 NOTE — Telephone Encounter (Signed)
Called [facility] and gave a verbal prescription for Dysport 500 units to complete prescription. Will call back to get refills.

## 2022-08-12 ENCOUNTER — Telehealth: Payer: Self-pay | Admitting: *Deleted

## 2022-08-12 DIAGNOSIS — G8114 Spastic hemiplegia affecting left nondominant side: Secondary | ICD-10-CM

## 2022-08-12 DIAGNOSIS — Z8673 Personal history of transient ischemic attack (TIA), and cerebral infarction without residual deficits: Secondary | ICD-10-CM

## 2022-08-12 NOTE — Telephone Encounter (Deleted)
-----   Message from [name], MD sent at 06/04/2022  7:23 AM EST ----- Please send order for aquatic PT at [facility] ----- Message ----- From: Mariah Milling, OT Sent: 06/03/2022   4:20 PM EST To: [name], MD; Kerrie Buffalo, OT  Dr Raliegh Ip- I have finished the OT aquatics portion of this episode of care for OT.  He is still seeing Sarah OT at [facility].   Othie is interested in an order for PT for aquatics.   Can you please send an order to [facility] [address] if you agree?   He could continue to benefit for LLE/gait and balance.   Thanks so much! Vania Rea

## 2022-08-12 NOTE — Telephone Encounter (Signed)
Orders placed for aquatic therapy per Dr Letta Pate @ [facility].

## 2022-08-13 ENCOUNTER — Ambulatory Visit: Payer: Managed Care, Other (non HMO) | Admitting: Rehabilitation

## 2022-08-13 ENCOUNTER — Encounter: Payer: Self-pay | Admitting: Rehabilitation

## 2022-08-13 DIAGNOSIS — R2689 Other abnormalities of gait and mobility: Secondary | ICD-10-CM

## 2022-08-13 DIAGNOSIS — R2681 Unsteadiness on feet: Secondary | ICD-10-CM

## 2022-08-13 DIAGNOSIS — M6281 Muscle weakness (generalized): Secondary | ICD-10-CM

## 2022-08-13 DIAGNOSIS — I69354 Hemiplegia and hemiparesis following cerebral infarction affecting left non-dominant side: Secondary | ICD-10-CM | POA: Diagnosis not present

## 2022-08-13 NOTE — Therapy (Signed)
OUTPATIENT PHYSICAL THERAPY NEURO TREATMENT   Patient Name: Dennis Mercado MRN: [id number] DOB:[birth year], 42 y.o., male Today's Date: 08/13/2022   PCP: Libby Maw, MD REFERRING PROVIDER: [name], MD  END OF SESSION:  PT End of Session - 08/13/22 0808     Visit Number 4    Number of Visits 6   authorization for 5 visits only   Date for PT Re-Evaluation 08/27/22   updated to allow 2 more approved visits   Authorization Type [employer]    Authorization - Visit Number 3    Authorization - Number of Visits 5    PT Start Time 0845    PT Stop Time 0930    PT Time Calculation (min) 45 min    Equipment Utilized During Treatment --   utilized floatation devices as needed for safety   Activity Tolerance Patient tolerated treatment well    Behavior During Therapy [facility] for tasks assessed/performed             History reviewed. No pertinent past medical history. Past Surgical History:  Procedure Laterality Date   WISDOM TOOTH EXTRACTION     Patient Active Problem List   Diagnosis Date Noted   Screening for prostate cancer 12/20/2021   Grieving 12/20/2021   Prediabetes 06/27/2021   Mixed hyperlipidemia 10/05/2020   History of CVA (cerebrovascular accident) 01/19/2020   Normocytic anemia    Benign essential HTN    Hyponatremia    Acute blood loss anemia    Hypotension due to drugs    Vascular headache    Prerenal azotemia    Sleep disturbance    Leukocytosis    Transaminitis    Marijuana abuse    Essential hypertension    Dysphagia, post-stroke    Left hemiparesis (HCC)    Acute respiratory failure (HCC)    ICH (intracerebral hemorrhage) ([place]) 12/06/2019    ONSET DATE: June 2021  REFERRING DIAG:   Diagnosis  G81.14 (ICD-10-CM) - Left spastic hemiplegia ([place])  Z86.73 (ICD-10-CM) - History of CVA (cerebrovascular accident)    THERAPY DIAG:  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  Is scheduled to get it next Tuesday.  No other issues, pt worked out in gym before pool session.    Pt received OP PT at this facility from 01-17-20 - 03-13-21 (27 visits) and again from 08-02-21 - 12-03-21 for 18 more visits.   Pt accompanied by: self  PERTINENT HISTORY: Patient is a 42 year old male referred to Neuro OPPT for left spastic hemiplegia.   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   PAIN:  Are you having pain? No  PRECAUTIONS: None  WEIGHT BEARING RESTRICTIONS: No  FALLS: Has patient fallen in last 6 months? No  LIVING ENVIRONMENT: Lives with: lives with their spouse Lives in: House/apartment Stairs: No Has following equipment at home: Single point cane  PLOF: Independent prior to CVA  PATIENT GOALS: be able to do aquatic exercise program independently at [facility] for aquatic exercise - wants to walk in lap pool at [place]:   DIAGNOSTIC FINDINGS: None since CT scan was done in July 2021  COGNITION: Overall cognitive status: Within functional limits for tasks assessed   SENSATION: Not tested  COORDINATION: Decreased LLE due to spasticity and decreased AROM & strength  POSTURE: No Significant postural limitations; Lt wrist in flexion due to spasticity -   LOWER EXTREMITY ROM:   RLE  WNL's;  LLE WFL's except for Lt ankle ROM which is decreased due to weakness and increased tone   LOWER EXTREMITY MMT:    MMT Right Eval Left Eval  Hip flexion  4  Hip extension    Hip abduction     Hip adduction    Hip internal rotation    Hip external rotation    Knee flexion  3-  Knee extension  5  Ankle dorsiflexion  3-  Ankle plantarflexion    Ankle inversion    Ankle eversion    (Blank rows = not tested)   TODAY'S TREATMENT:                                                                                                                              DATE:    Patient seen for aquatic therapy today.  Treatment took place in water 3.6-4.0 feet deep depending upon activity.  Pt entered and exited the pool via stairs using R rails in alternating fashion.    Warm up: In approx 4' dept, had pt ambulate approx 18' x 2 laps forwards without support, backwards x 2 laps (same distance), Side stepping with squat and moving dumbells (small) from abd to add with LEs x 2 laps with heavy cues at times for not rotating trunk on L.     NMR:  Standing near side of pool: kept LLE in stance moving RLE into hip flex (with knee in ext) and ext x 10 reps (again knee in ext), holding yellow pool noodle for support only.  R then LLE circles moving clockwise x 10 reps then counter  clockwise x 10 reps.  RLE moving into hip flex and ext with knee at 90 deg flex x 10 reps.  All performed with light UE support.  Pt does need more intermittent support when in L LE stance.  Jumping jacks (no UEs) x 20 reps, scissor jumps x 20 reps.  Both jumping tasks with intermittent support from wall.  Standing on pool noodle with UE support trying to progress to no UE support. He is able to do without support for up to 10 secs.  Educated and trialed in session to progress with small head motions.  He needs more support for this task at this time.    Continue to trial saddle sit on yellow noodle for core strengthening and stability.  Did add floatation belt today to assist with balance.  He was able to balance today without support from PT (following him getting noodle situated in position).  Performed 2 reps of 20 secs  progressing to performing bicycles with LEs and paddling across to other side of pool x 2 reps.  Standing hamstring stretch with LE on pool noodle, allowing it to elevate leg into flexion (knee ext) on LLE x 30 secs.   Ended session with jogging x 18' x 4 laps with PT providing light assist initially to no assist with cues for improved L knee flex.  Pt tolerated well.     Discussed pool sessions and he has 2 more approved sessions.  Will extend POC to incorporate these two sessions, but feel pt will be independent with HEP following those two as he has membership to [employer].  Pt verbalized understanding.    Pt requires buoyancy of water for support for reduced fall risk and for unloading/reduced stress on joints (L ankle) as pt able to tolerate increased standing (esp L SLS)  and ambulation in water compared to that on land; viscosity of water is needed for resistance for strengthening and current of water provides perturbations for challenge for balance training     PATIENT EDUCATION Education details: eval results - plan for 5 visits aquatic PT (insurance only authorizing 5 visits initially) Person educated: Patient Education method: Explanation Education comprehension: verbalized understanding  HOME EXERCISE PROGRAM: Aquatic HEP to be established   GOALS: Goals reviewed with patient? Yes   LONG TERM GOALS: Target date: 08-09-22 (5 visits initially authorized)  Pt will be independent in aquatic exercise program, including walking in lap pool to be continued at [facility] (pt is currently a member) upon discharge from PT.   Baseline: to be established Goal status: INITIAL  2.  Pt will demonstrate improved SLS on LLE so pt able to stand on Lt leg for at least 5 secs without UE support in 4' water depth to demo improved balance.  Baseline: currently unable to perform SLS on LLE on land Goal status: INITIAL  3.  Pt will demonstrate improved coordination and strength of LLE so he is able  to "jog" across width of pool x 2 reps without LOB (18' x 2 = 36').  Baseline:  Goal status: INITIAL  4.  Pt will subjectively report ability to amb. In community without use of SPC at least 25% time.  Baseline:  Goal status: INITIAL ASSESSMENT:  CLINICAL IMPRESSION: Skilled session in pool to allow buoyancy to assist with decreased fall risk and resistance for strengthening.  Emphasized LLE strengthening in open and closed chain exercises and L SLS especially knee and hip flex to break out of extensor tone.  Pt  tolerating higher level tasks and performing better in L SLS.  Feel that he will not need more visits past his 5 approved visits.    OBJECTIVE IMPAIRMENTS: Abnormal gait, decreased balance, decreased coordination, decreased ROM, and decreased strength.   ACTIVITY LIMITATIONS: bending, squatting, and locomotion level  PARTICIPATION LIMITATIONS: community activity  PERSONAL FACTORS: 1 comorbidity: Lt hemiparesis due to Rt CVA  are also affecting patient's functional outcome.   REHAB POTENTIAL: Good  CLINICAL DECISION MAKING: Stable/uncomplicated  EVALUATION COMPLEXITY: Low  PLAN:  PT FREQUENCY: 1x/week  PT DURATION: other: 3 weeks (to get in last 2 visits)  PLANNED INTERVENTIONS: Aquatic Therapy  PLAN FOR NEXT SESSION: begin aquatic therapy 07-02-22  Cameron Sprang, PT, MPT [facility] [address] [place], [place], [address] Phone: [phone]   Fax:  [phone] 08/13/22, 1:33 PM

## 2022-08-20 ENCOUNTER — Encounter: Payer: Managed Care, Other (non HMO) | Admitting: Physical Medicine & Rehabilitation

## 2022-08-20 ENCOUNTER — Ambulatory Visit: Payer: Managed Care, Other (non HMO) | Admitting: Rehabilitation

## 2022-08-27 ENCOUNTER — Encounter: Payer: Self-pay | Admitting: Rehabilitation

## 2022-08-27 ENCOUNTER — Ambulatory Visit: Payer: Managed Care, Other (non HMO) | Attending: Physical Medicine & Rehabilitation | Admitting: Rehabilitation

## 2022-08-27 DIAGNOSIS — M6281 Muscle weakness (generalized): Secondary | ICD-10-CM | POA: Diagnosis present

## 2022-08-27 DIAGNOSIS — R2681 Unsteadiness on feet: Secondary | ICD-10-CM | POA: Insufficient documentation

## 2022-08-27 DIAGNOSIS — I69354 Hemiplegia and hemiparesis following cerebral infarction affecting left non-dominant side: Secondary | ICD-10-CM | POA: Insufficient documentation

## 2022-08-27 DIAGNOSIS — R2689 Other abnormalities of gait and mobility: Secondary | ICD-10-CM | POA: Diagnosis present

## 2022-08-27 NOTE — Therapy (Signed)
OUTPATIENT PHYSICAL THERAPY NEURO TREATMENT   Patient Name: Dennis Mercado MRN: [id number] DOB:15-Feb-1981, 42 y.o., male Today's Date: 08/27/2022   PCP: Libby Maw, MD REFERRING PROVIDER: [name], MD  END OF SESSION:  PT End of Session - 08/27/22 0820     Visit Number 5    Number of Visits 6   authorization for 5 visits only   Date for PT Re-Evaluation 09/03/22   updated to allow 2 more approved visits   Authorization Type [employer]    Authorization - Visit Number 4    Authorization - Number of Visits 5    PT Start Time [phone]    PT Stop Time 0935    PT Time Calculation (min) 42 min    Equipment Utilized During Treatment --   utilized floatation devices as needed for safety   Activity Tolerance Patient tolerated treatment well    Behavior During Therapy [facility] for tasks assessed/performed             History reviewed. No pertinent past medical history. Past Surgical History:  Procedure Laterality Date   WISDOM TOOTH EXTRACTION     Patient Active Problem List   Diagnosis Date Noted   Screening for prostate cancer 12/20/2021   Grieving 12/20/2021   Prediabetes 06/27/2021   Mixed hyperlipidemia 10/05/2020   History of CVA (cerebrovascular accident) 01/19/2020   Normocytic anemia    Benign essential HTN    Hyponatremia    Acute blood loss anemia    Hypotension due to drugs    Vascular headache    Prerenal azotemia    Sleep disturbance    Leukocytosis    Transaminitis    Marijuana abuse    Essential hypertension    Dysphagia, post-stroke    Left hemiparesis (HCC)    Acute respiratory failure (HCC)    ICH (intracerebral hemorrhage) ([place]) 12/06/2019    ONSET DATE: June 2021  REFERRING DIAG:   Diagnosis  G81.14 (ICD-10-CM) - Left spastic hemiplegia ([place])  Z86.73 (ICD-10-CM) - History of CVA (cerebrovascular accident)    THERAPY DIAG:  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    Pt received OP PT at this facility from 01-17-20 - 03-13-21 (27 visits) and again from 08-02-21 - 12-03-21 for 18 more visits.   Pt accompanied by: self  PERTINENT HISTORY: Patient is a 42 year old male referred to Neuro OPPT for left spastic hemiplegia.   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   PAIN:  Are you having pain? No  PRECAUTIONS: None  WEIGHT BEARING RESTRICTIONS: No  FALLS: Has patient fallen in last 6 months? No  LIVING ENVIRONMENT: Lives with: lives with their spouse Lives in: House/apartment Stairs: No Has following equipment at home: Single point cane  PLOF: Independent prior to CVA  PATIENT GOALS: be able to do aquatic exercise program independently at [facility] for aquatic exercise - wants to walk in lap pool at [place]:   DIAGNOSTIC FINDINGS: None since CT scan was done in July 2021  COGNITION: Overall cognitive status: Within functional limits for tasks assessed   SENSATION: Not tested  COORDINATION: Decreased LLE due to spasticity and decreased AROM & strength  POSTURE: No Significant postural limitations; Lt wrist in flexion due to spasticity -   LOWER EXTREMITY ROM:   RLE  WNL's;  LLE WFL's except for Lt ankle ROM which is decreased due to weakness and increased tone   LOWER EXTREMITY MMT:    MMT Right Eval Left Eval  Hip flexion  4  Hip extension    Hip abduction    Hip adduction    Hip internal rotation     Hip external rotation    Knee flexion  3-  Knee extension  5  Ankle dorsiflexion  3-  Ankle plantarflexion    Ankle inversion    Ankle eversion    (Blank rows = not tested)   TODAY'S TREATMENT:                                                                                                                              DATE:    Patient seen for aquatic therapy today.  Treatment took place in water 3.6-4.0 feet deep depending upon activity.  Pt entered and exited the pool via stairs using R rails in alternating fashion.    Warm up: In approx 4' dept, had pt ambulate approx 18' x 4 laps forwards without support, backwards x 4 laps (same distance), Side stepping with squat and moving dumbells (small) from abd to add with LEs x 2 laps with only min cues at times for not rotating trunk on L.     NMR:  Standing near side of pool: kept LLE in stance moving RLE into hip flex (with knee in ext) and ext x 10 reps (again knee in ext), holding yellow pool noodle for support only.  Then switched with RLE in stance x 10 reps.  We did attempt without support, however he is unable to do  when in stance on LLE.  RLE circles moving clockwise x 10 reps then counter clockwise x 10 reps.  LLE moving into hip flex and ext with knee at 90 deg flex x 10 reps.  All performed with light UE support from noodle.  Pt does need more intermittent support when in L LE stance.   Standing on pool noodle (small blue one) with feet apart x 2 reps of 20 secs with only intermittent fingertip touch needed then with feet slightly narrower than shoulder width maintaining balance x 2 sets of 20 secs again with only intermittent support needed, which is marked improvement from last session.   LE/Core strengthening:  Standing, had pt work to self place LLE on blue pool noodle moving slowly from knee/hip flex to ext x 20 reps then increasing speed for core stabilization.  Then switched and did RLE for LLE stance control.  Pt had difficulty  with both and needed cues for improving LLE proximal activation and maintaining a longer trunk on L side.       Ended session with resisted jogging/walking x 18' x 4 laps with no assist but cues for improved L knee flex and to push off toes.  During last two laps, provided varied perturbations to either side for step strategy.  Pt able to self correct each time.  Then performed resisted side stepping x 2 laps, pt with more difficulty leading to the L due to poor placement of LLE.  May try to add ankle weight next session.      Pt requires buoyancy of water for support for reduced fall risk and for unloading/reduced stress on joints (L ankle) as pt able to tolerate increased standing (esp L SLS)  and ambulation in water compared to that on land; viscosity of water is needed for resistance for strengthening and current of water provides perturbations for challenge for balance training     PATIENT EDUCATION Education details: eval results - plan for 5 visits aquatic PT (insurance only authorizing 5 visits initially) Person educated: Patient Education method: Explanation Education comprehension: verbalized understanding  HOME EXERCISE PROGRAM: Aquatic HEP to be established   GOALS: Goals reviewed with patient? Yes   LONG TERM GOALS: Target date: 08-09-22 (5 visits initially authorized)  Pt will be independent in aquatic exercise program, including walking in lap pool to be continued at [place] (pt is currently a member) upon discharge from PT.   Baseline: to be established Goal status: INITIAL  2.  Pt will demonstrate improved SLS on LLE so pt able to stand on Lt leg for at least 5 secs without UE support in 4' water depth to demo improved balance.  Baseline: currently unable to perform SLS on LLE on land Goal status: INITIAL  3.  Pt will demonstrate improved coordination and strength of LLE so he is able to "jog" across width of pool x 2 reps without LOB (18' x 2 = 36').  Baseline:   Goal status: INITIAL  4.  Pt will subjectively report ability to amb. In community without use of SPC at least 25% time.  Baseline:  Goal status: INITIAL ASSESSMENT:  CLINICAL IMPRESSION: Skilled session in pool to allow buoyancy to assist with decreased fall risk and resistance for strengthening.  Emphasized LLE strengthening in open and closed chain exercises and L SLS especially knee and hip flex to break out of extensor tone.  Continue to see improvements in time spent in SLS/amount of support needed, and overall balance improvements, indicative  of improved BLE strength and core strength.  Will do last pool session in lap per pt request.    OBJECTIVE IMPAIRMENTS: Abnormal gait, decreased balance, decreased coordination, decreased ROM, and decreased strength.   ACTIVITY LIMITATIONS: bending, squatting, and locomotion level  PARTICIPATION LIMITATIONS: community activity  PERSONAL FACTORS: 1 comorbidity: Lt hemiparesis due to Rt CVA  are also affecting patient's functional outcome.   REHAB POTENTIAL: Good  CLINICAL DECISION MAKING: Stable/uncomplicated  EVALUATION COMPLEXITY: Low  PLAN:  PT FREQUENCY: 1x/week  PT DURATION: other: 3 weeks (to get in last 2 visits)  PLANNED INTERVENTIONS: Aquatic Therapy  PLAN FOR NEXT SESSION: bring handout, do session in lap pool   Cameron Sprang, PT, MPT [facility] [address] [place], [place], [address] Phone: [phone]   Fax:  [phone] 08/27/22, 9:40 AM

## 2022-08-28 ENCOUNTER — Telehealth: Payer: Self-pay

## 2022-08-28 NOTE — Telephone Encounter (Signed)
[place] approved for Dysport valid 08/20/22-08/19/23 Josem Kaufmann number is [id number]   Called patient and left Vm to advise

## 2022-09-03 ENCOUNTER — Ambulatory Visit: Payer: Managed Care, Other (non HMO) | Admitting: Rehabilitation

## 2022-09-03 ENCOUNTER — Encounter: Payer: Self-pay | Admitting: Rehabilitation

## 2022-09-03 DIAGNOSIS — I69354 Hemiplegia and hemiparesis following cerebral infarction affecting left non-dominant side: Secondary | ICD-10-CM | POA: Diagnosis not present

## 2022-09-03 DIAGNOSIS — M6281 Muscle weakness (generalized): Secondary | ICD-10-CM

## 2022-09-03 DIAGNOSIS — R2689 Other abnormalities of gait and mobility: Secondary | ICD-10-CM

## 2022-09-03 DIAGNOSIS — R2681 Unsteadiness on feet: Secondary | ICD-10-CM

## 2022-09-03 NOTE — Therapy (Signed)
OUTPATIENT PHYSICAL THERAPY NEURO TREATMENT AND DC SUMMARY   Patient Name: Dennis Mercado MRN: [id number] DOB:1980-07-18, 42 y.o., male Today's Date: 09/03/2022   PCP: Libby Maw, MD REFERRING PROVIDER: [name], MD  END OF SESSION:  PT End of Session - 09/03/22 0817     Visit Number 6    Number of Visits 6   authorization for 5 visits only   Date for PT Re-Evaluation 09/03/22   updated to allow 2 more approved visits   Authorization Type [employer]    Authorization - Visit Number 5    Authorization - Number of Visits 5    PT Start Time [phone]    PT Stop Time 0920    PT Time Calculation (min) 45 min    Equipment Utilized During Treatment --   utilized floatation devices as needed for safety   Activity Tolerance Patient tolerated treatment well    Behavior During Therapy [facility] for tasks assessed/performed             History reviewed. No pertinent past medical history. Past Surgical History:  Procedure Laterality Date   WISDOM TOOTH EXTRACTION     Patient Active Problem List   Diagnosis Date Noted   Screening for prostate cancer 12/20/2021   Grieving 12/20/2021   Prediabetes 06/27/2021   Mixed hyperlipidemia 10/05/2020   History of CVA (cerebrovascular accident) 01/19/2020   Normocytic anemia    Benign essential HTN    Hyponatremia    Acute blood loss anemia    Hypotension due to drugs    Vascular headache    Prerenal azotemia    Sleep disturbance    Leukocytosis    Transaminitis    Marijuana abuse    Essential hypertension    Dysphagia, post-stroke    Left hemiparesis (HCC)    Acute respiratory failure (HCC)    ICH (intracerebral hemorrhage) ([place]) 12/06/2019    ONSET DATE: June 2021  REFERRING DIAG:   Diagnosis  G81.14 (ICD-10-CM) - Left spastic hemiplegia ([place])  Z86.73 (ICD-10-CM) - History of CVA (cerebrovascular accident)    THERAPY DIAG:  Spastic hemiplegia of left nondominant side as late effect of cerebral infarction  (HCC)  Muscle weakness (generalized)  Unsteadiness on feet  Other abnormalities of gait and mobility  Rationale for Evaluation and Treatment: Rehabilitation  SUBJECTIVE:                                                                                                                                                                                             SUBJECTIVE STATEMENT:  Pt reports doing well, no new issues to report.  Pt received OP PT at this facility from 01-17-20 - 03-13-21 (27 visits) and again from 08-02-21 - 12-03-21 for 18 more visits.   Pt accompanied by: self  PERTINENT HISTORY: Patient is a 42 year old male referred to Neuro OPPT for left spastic hemiplegia.   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   PAIN:  Are you having pain? No  PRECAUTIONS: None  WEIGHT BEARING RESTRICTIONS: No  FALLS: Has patient fallen in last 6 months? No  LIVING ENVIRONMENT: Lives with: lives with their spouse Lives in: House/apartment Stairs: No Has following equipment at home: Single point cane  PLOF: Independent prior to CVA  PATIENT GOALS: be able to do aquatic exercise program independently at [facility] for aquatic exercise - wants to walk in lap pool at [place]:   DIAGNOSTIC FINDINGS: None since CT scan was done in July 2021  COGNITION: Overall cognitive status: Within functional limits for tasks assessed   SENSATION: Not tested  COORDINATION: Decreased LLE due to spasticity and decreased AROM & strength  POSTURE: No Significant postural limitations; Lt wrist in flexion due to spasticity -   LOWER EXTREMITY ROM:   RLE  WNL's;  LLE WFL's except for Lt ankle ROM which is decreased due to weakness and increased tone   LOWER EXTREMITY MMT:    MMT Right Eval Left Eval  Hip flexion  4  Hip extension    Hip abduction    Hip adduction    Hip internal rotation    Hip external rotation    Knee flexion  3-  Knee extension   5  Ankle dorsiflexion  3-  Ankle plantarflexion    Ankle inversion    Ankle eversion    (Blank rows = not tested)   TODAY'S TREATMENT:                                                                                                                              DATE:    Patient seen for aquatic therapy today.  Treatment took place in water 3.6-4.0 feet deep depending upon activity.  Pt entered and exited the pool via stairs using R rails in alternating fashion.  Utilized lap pool today per pt request as when he comes he will likely use that pool.    Warm up: In approx 4' dept, had pt ambulate approx 25' x 2 laps forwards without support, backwards x 2 laps (same distance), Side stepping with squat and moving dumbells (small) from abd to add with LEs x 2 laps with only min cues at times for not rotating trunk on L.  Forward marching with alt arm motions with small barbells x 2 laps.  Cues for slower stepping to allow increased time in LLE WB.     NMR:  Standing near side of pool initially with small pool noodle, however due to decreased balance and increaesd movement in lap pool today, needed slight  support from edge of pool.  Kept LLE in stance moving RLE into hip flex (with knee in ext) and ext x 10 reps (again knee in ext).  LLE moving into hip flex and ext with knee at 90 deg flex x 10 reps.  All performed with light UE support from side of pool.  Pt does need more intermittent support when in L LE stance.   Standing on pool noodle (small blue one) with feet apart (wider than shoulder) x 2 reps of 20 secs with only intermittent fingertip touch needed then alt UE flex x 10 reps.  Side stepping on pool noodle x 4 reps L and R with intermittent support from PT (as he was facing away from wall).    Standing near steps for RUE support as needed, tapping RLE to first then second step before returning to pool floor x 10 reps progressing to stepping LLE to first step, stepping up and allowing RLE to come  into march x 20 reps.  Min tactile and verbal cues for "pushing" hip into more extension when LLE in stance on step.    LE stretching:  With back against pool wall, had pt self place noodle to ensure he can do on his own for HEP for single LE hamstring stretch (noodle at calf) x 30 secs, then moving into abd for hamstring/groin stretch x 30 secs.  Did both sides.       Ended session with forward swimming with floatation belt donned x 30' x 2 laps.  Demonstrated how to utilize kick board for UEs while performing kicks to one side of pool.  Pt initially needing max A to maintain position of kickboard x one rep of 20' but then able to do with close S during second rep.       Pt requires buoyancy of water for support for reduced fall risk and for unloading/reduced stress on joints (L ankle) as pt able to tolerate increased standing (esp L SLS)  and ambulation in water compared to that on land; viscosity of water is needed for resistance for strengthening and current of water provides perturbations for challenge for balance training     PATIENT EDUCATION Education details: eval results - plan for 5 visits aquatic PT (insurance only authorizing 5 visits initially) Person educated: Patient Education method: Explanation Education comprehension: verbalized understanding  HOME EXERCISE PROGRAM: Emailed to pt on 09/03/22:  Access Code: [email or web address] URL: [email or web address] Date: 09/03/2022 Prepared by: Cameron Sprang  Exercises - Forward March with Hand Floats  - 1 x daily - 7 x weekly - 1 sets - 20 reps - Standing Hip Flexion Extension at [employer]  - 1 x daily - 7 x weekly - 1 sets - 10 reps - Side Stepping with Hand Floats  - 1 x daily - 7 x weekly - 3 sets - 10 reps - Side to Side Hamstring Stretch with Noodle at [place]  - 1 x daily - 7 x weekly - 1 sets - 2 reps - 30 secs hold - Wide Stance on Pool Noodle  - 1 x daily - 7 x weekly - 3 sets - 10 reps - Flutter Kick at [employer]   - 1 x daily - 7 x weekly - 3 sets - 10 reps - Jumping Jacks  - 1 x daily - 7 x weekly - 1 sets - 20 reps - Alternating Arms Jumping in Place  - 1 x daily - 7 x weekly - 1 sets -  20 reps  GOALS: Goals reviewed with patient? Yes   LONG TERM GOALS: Target date: 08-09-22 (5 visits initially authorized)  Pt will be independent in aquatic exercise program, including walking in lap pool to be continued at [place] (pt is currently a member) upon discharge from PT.   Baseline:  Goal status: MET  2.  Pt will demonstrate improved SLS on LLE so pt able to stand on Lt leg for at least 5 secs without UE support in 4' water depth to demo improved balance.  Baseline:  Goal status: MET (in aquatic pool, not in lap pool)  3.  Pt will demonstrate improved coordination and strength of LLE so he is able to "jog" across width of pool x 2 reps without LOB (18' x 2 = 36').  Baseline:  Goal status: MET  4.  Pt will subjectively report ability to amb. In community without use of SPC at least 25% time.  Baseline:  Goal status: MET  PHYSICAL THERAPY DISCHARGE SUMMARY  Visits from Start of Care: 6  Current functional level related to goals / functional outcomes: Pt continues to have spastic hemiplegia of LLE/UE, but has made improvement in balance and strength. He no longer uses cane in community unless going longer distances and has better LLE SLS.    Remaining deficits: See above   Education / Equipment: HEP for pool    Patient agrees to discharge. Patient goals were met. Patient is being discharged due to meeting the stated rehab goals.   ASSESSMENT:  CLINICAL IMPRESSION: Skilled session in pool to allow buoyancy to assist with decreased fall risk and resistance for strengthening.  Pt performed session in lap pool today for last session as he is a member at [facility] [facility] and will likely be in that pool rather than therapy pool.  Went through exercises that PT sent via email to patient.  He has met  all LTGs and is ready for D/C.    OBJECTIVE IMPAIRMENTS: Abnormal gait, decreased balance, decreased coordination, decreased ROM, and decreased strength.   ACTIVITY LIMITATIONS: bending, squatting, and locomotion level  PARTICIPATION LIMITATIONS: community activity  PERSONAL FACTORS: 1 comorbidity: Lt hemiparesis due to Rt CVA  are also affecting patient's functional outcome.   REHAB POTENTIAL: Good  CLINICAL DECISION MAKING: Stable/uncomplicated  EVALUATION COMPLEXITY: Low  PLAN:  PT FREQUENCY: 1x/week  PT DURATION: other: 3 weeks (to get in last 2 visits)  PLANNED INTERVENTIONS: Aquatic Therapy  PLAN FOR NEXT SESSION: bring handout, do session in lap pool   Cameron Sprang, PT, MPT [facility] [address] [place], [place], [address] Phone: [phone]   Fax:  [phone] 09/03/22, 1:15 PM

## 2022-09-17 ENCOUNTER — Encounter
Payer: Managed Care, Other (non HMO) | Attending: Physical Medicine & Rehabilitation | Admitting: Physical Medicine & Rehabilitation

## 2022-09-17 ENCOUNTER — Encounter: Payer: Self-pay | Admitting: Physical Medicine & Rehabilitation

## 2022-09-17 VITALS — BP 129/86 | HR 76 | Ht 67.0 in | Wt 159.6 lb

## 2022-09-17 DIAGNOSIS — G8114 Spastic hemiplegia affecting left nondominant side: Secondary | ICD-10-CM | POA: Diagnosis not present

## 2022-09-17 MED ORDER — ABOBOTULINUMTOXINA 500 UNITS IM SOLR: 1500.0000 [IU] | Freq: Once | INTRAMUSCULAR | Status: AC

## 2022-09-17 MED ORDER — SODIUM CHLORIDE (PF) 0.9 % IJ SOLN: 7.5000 mL | Freq: Once | INTRAMUSCULAR | Status: AC

## 2022-09-17 NOTE — Patient Instructions (Signed)

## 2022-09-17 NOTE — Telephone Encounter (Signed)
Called [facility] due to [place] not having dysport shipped to [place] yet. Spoke w/ Rep and she advised that the additional steps that were suppose to be taken were not taken, she did receive the information about the authorization when I had called on 08/28/22, I left VM for patient that same day to let him know Josem Kaufmann was done so that [facility] can have that medicine shipped to [place]. New steps they will contact patient to order scheduling or the patient can give them consent to have [place] schedule on his behalf.

## 2022-09-17 NOTE — Progress Notes (Signed)
Dysport Injection for spasticity using needle EMG guidance  Dilution: 200 Units/ml Indication: Severe spasticity which interferes with ADL,mobility and/or  hygiene and is unresponsive to medication management and other conservative care Informed consent was obtained after describing risks and benefits of the procedure with the patient. This includes bleeding, bruising, infection, excessive weakness, or medication side effects. A REMS form is on file and signed. Needle:  needle electrode Number of units per muscle Dysport  LEFT FCR 200 FDS 200 FDP 200 Biceps 500 BR 200 FPL 100 FCU 100 All injections were done after obtaining appropriate EMG activity and after negative drawback for blood. The patient tolerated the procedure well. Post procedure instructions were given. A followup appointment was made.   

## 2022-09-26 ENCOUNTER — Telehealth: Payer: Self-pay

## 2022-09-26 NOTE — Telephone Encounter (Signed)
Patient Dysport will be shipped and the target delivery date is 10/01/2022

## 2022-10-01 ENCOUNTER — Encounter: Payer: Self-pay | Admitting: Family Medicine

## 2022-10-01 ENCOUNTER — Telehealth: Payer: Self-pay

## 2022-10-01 ENCOUNTER — Ambulatory Visit: Payer: Managed Care, Other (non HMO) | Admitting: Family Medicine

## 2022-10-01 VITALS — BP 122/60 | HR 76 | Temp 97.6°F | Resp 18 | Ht 67.0 in

## 2022-10-01 DIAGNOSIS — E782 Mixed hyperlipidemia: Secondary | ICD-10-CM | POA: Diagnosis not present

## 2022-10-01 DIAGNOSIS — I10 Essential (primary) hypertension: Secondary | ICD-10-CM | POA: Diagnosis not present

## 2022-10-01 DIAGNOSIS — E1169 Type 2 diabetes mellitus with other specified complication: Secondary | ICD-10-CM

## 2022-10-01 DIAGNOSIS — R7303 Prediabetes: Secondary | ICD-10-CM | POA: Diagnosis not present

## 2022-10-01 LAB — POCT GLYCOSYLATED HEMOGLOBIN (HGB A1C): Hemoglobin A1C: 6.7 % — AB (ref 4.0–5.6)

## 2022-10-01 MED ORDER — METFORMIN HCL ER 500 MG PO TB24
500.0000 mg | ORAL_TABLET | Freq: Every day | ORAL | 1 refills | Status: DC
Start: 2022-10-01 — End: 2023-01-21

## 2022-10-01 NOTE — Telephone Encounter (Signed)
Patient states that he may need the Dysport dosage reduced. He states it is hard for him to grip while at the gym. Please advise.

## 2022-10-01 NOTE — Progress Notes (Addendum)
Established Patient Office Visit   Subjective:  Patient ID: Dennis Mercado, male    DOB: 03-25-1981  Age: 42 y.o. MRN: [id number]  Chief Complaint  Patient presents with   3 month follow up    Concerns/ questions: none Foot exam due today Eye exam: pt is scheduled today at [facility] [place] Hep C screen due today     HPI Encounter Diagnoses  Name Primary?   Prediabetes Yes   Benign essential HTN    Mixed hyperlipidemia    For follow-up of above.  Blood pressure is under much better control after increasing lisinopril.  He is tolerating it well.  Prediabetes is controlled with diet.  Continues on moderate dose of atorvastatin with his history of CVA.  Last LDL cholesterol was 58.  Continues working with physical therapy.  He is now walking more without his cane.  He is starting exercise using the treadmill.   Review of Systems  Constitutional: Negative.   HENT: Negative.    Eyes:  Negative for blurred vision, discharge and redness.  Respiratory: Negative.    Cardiovascular: Negative.   Gastrointestinal:  Negative for abdominal pain.  Genitourinary: Negative.   Musculoskeletal: Negative.  Negative for myalgias.  Skin:  Negative for rash.  Neurological:  Negative for tingling, loss of consciousness and weakness.  Endo/Heme/Allergies:  Negative for polydipsia.      10/09/2022    9:22 AM 10/01/2022    8:13 AM 09/17/2022    2:57 PM  Depression screen PHQ 2/9  Decreased Interest 0 0 0  Down, Depressed, Hopeless 0 0 0  PHQ - 2 Score 0 0 0      Current Outpatient Medications:    amLODipine (NORVASC) 10 MG tablet, TAKE 1 TABLET BY MOUTH DAILY, Disp: 90 tablet, Rfl: 1   atorvastatin (LIPITOR) 40 MG tablet, Take 1 tablet (40 mg total) by mouth daily., Disp: 90 tablet, Rfl: 1   botulinum toxin Type A (BOTOX) 200 units injection, Inject 400 Units into the muscle every 3 (three) months., Disp: 2 each, Rfl: 3   carvedilol (COREG) 25 MG tablet, TAKE 1 TABLET(25 MG) BY  MOUTH TWICE DAILY WITH A MEAL, Disp: 60 tablet, Rfl: 3   DYSPORT 500 units SOLR injection, INJECT LEFT BICEPS 500 UNITS, FPL 100 UNITS, FCR 300 UNITS, PALMARIS LONGUS 100 UNITS, FDS 200 UNITS, FDP 300 UNITS. AS DIRECTED TO BE ADMINISTERED IN PRESCRIBERS OFFICE EVERY 3 MONTHS AS DIRECTED, Disp: 3 each, Rfl: 0   lisinopril (ZESTRIL) 10 MG tablet, Take 1 tablet (10 mg total) by mouth daily., Disp: 90 tablet, Rfl: 3   metFORMIN (GLUCOPHAGE-XR) 500 MG 24 hr tablet, Take 1 tablet (500 mg total) by mouth daily with breakfast. (Patient not taking: Reported on 10/09/2022), Disp: 90 tablet, Rfl: 1   tadalafil (CIALIS) 5 MG tablet, Take 5 mg by mouth daily as needed., Disp: , Rfl:   Current Facility-Administered Medications:    AbobotulinumtoxinA (DYSPORT) 500 units injection 1,500 Units, 1,500 Units, Intramuscular, Once, Kirsteins, Victorino Sparrow, MD   sodium chloride (PF) 0.9 % injection 7.5 mL, 7.5 mL, Other, Once, Kirsteins, Victorino Sparrow, MD   Objective:     BP 122/60   Pulse 76   Temp 97.6 F (36.4 C) (Oral)   Resp 18   Ht 5\' 7"  (1.702 m)   SpO2 95%   BMI 25.00 kg/m    Physical Exam Constitutional:      General: He is not in acute distress.    Appearance:  Normal appearance. He is not ill-appearing, toxic-appearing or diaphoretic.  HENT:     Head: Normocephalic and atraumatic.     Right Ear: External ear normal.     Left Ear: External ear normal.  Eyes:     General: No scleral icterus.       Right eye: No discharge.        Left eye: No discharge.     Extraocular Movements: Extraocular movements intact.     Conjunctiva/sclera: Conjunctivae normal.  Cardiovascular:     Rate and Rhythm: Normal rate and regular rhythm.  Pulmonary:     Effort: Pulmonary effort is normal. No respiratory distress.     Breath sounds: Normal breath sounds.  Musculoskeletal:     Cervical back: No rigidity or tenderness.  Skin:    General: Skin is warm and dry.  Neurological:     Mental Status: He is alert and  oriented to person, place, and time.  Psychiatric:        Mood and Affect: Mood normal.        Behavior: Behavior normal.    Diabetic Foot Exam - Simple   Simple Foot Form Diabetic Foot exam was performed with the following findings: Yes 10/01/2022  8:59 AM  Visual Inspection See comments: Yes Sensation Testing Intact to touch and monofilament testing bilaterally: Yes Pulse Check Posterior Tibialis and Dorsalis pulse intact bilaterally: Yes Comments Hallux valgus deformity bilaterally.  near pes cavus bilaterally.  Decreased strength to dorsi and plantar flexion in the left foot.  Unable to spread toes in the left foot.  No lesions or ulcerations noted in either foot.      Results for orders placed or performed in visit on 10/01/22  POCT HgB A1C  Result Value Ref Range   Hemoglobin A1C 6.7 (A) 4.0 - 5.6 %   HbA1c POC (<> result, manual entry)     HbA1c, POC (prediabetic range)     HbA1c, POC (controlled diabetic range)        The ASCVD Risk score (Arnett DK, et al., 2019) failed to calculate for the following reasons:   The valid total cholesterol range is 130 to 320 mg/dL    Assessment & Plan:   Prediabetes -     POCT glycosylated hemoglobin (Hb A1C) -     metFORMIN HCl ER; Take 1 tablet (500 mg total) by mouth daily with breakfast. (Patient not taking: Reported on 10/09/2022)  Dispense: 90 tablet; Refill: 1  Benign essential HTN  Mixed hyperlipidemia    Return in about 6 months (around 04/02/2023).   Continue current medications.  Will continue work with physical therapy.  Hemoglobin A1c has risen to 6.7.  Will restart metformin XR. Mliss Sax, MD

## 2022-10-02 ENCOUNTER — Encounter: Payer: Self-pay | Admitting: Family Medicine

## 2022-10-07 NOTE — Telephone Encounter (Signed)
Please advise message below patient have not started on Metformin.

## 2022-10-07 NOTE — Telephone Encounter (Signed)
Sent pt a My Chart message to call and make an appt

## 2022-10-08 ENCOUNTER — Encounter: Payer: Self-pay | Admitting: Physical Medicine & Rehabilitation

## 2022-10-08 ENCOUNTER — Telehealth: Payer: Self-pay | Admitting: Physical Medicine & Rehabilitation

## 2022-10-08 ENCOUNTER — Encounter: Payer: Managed Care, Other (non HMO) | Admitting: Physical Medicine & Rehabilitation

## 2022-10-08 VITALS — BP 123/82 | HR 79 | Ht 67.0 in | Wt 157.8 lb

## 2022-10-08 DIAGNOSIS — R7303 Prediabetes: Secondary | ICD-10-CM

## 2022-10-08 DIAGNOSIS — G8114 Spastic hemiplegia affecting left nondominant side: Secondary | ICD-10-CM | POA: Diagnosis not present

## 2022-10-08 NOTE — Addendum Note (Signed)
Addended by: Erick Colace on: 10/08/2022 04:40 PM   Modules accepted: Orders

## 2022-10-08 NOTE — Patient Instructions (Signed)

## 2022-10-08 NOTE — Telephone Encounter (Signed)
Patient called and is requesting a referral to [facility] , he called them and they are requesting a referral and pt asked if it could be sent for him.

## 2022-10-08 NOTE — Progress Notes (Signed)
Subjective:    Patient ID: Dennis Mercado, male    DOB: 11/27/1980, 42 y.o.   MRN: [id number] 42 y.o. right-handed male with documented history of hypertension on no current antihypertensive medications.  History taken from chart and wife due to cognition.  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  1 level home one-step to entry.  Presented 12/06/2019 the left side weakness altered mental status during sexual activity.  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  Per report measuring 3.6 x 2.8 x 2.6 cm.  Hemorrhage extending into the ventricles and could be seen in the third and fourth ventricles without hydrocephalus.  Local mass-effect without midline shift.  CT angiogram of head and neck normal variant.  CTA circle of Willis without significant proximal stenosis aneurysm or branch vessel occlusion.  Admission chemistries potassium 3.2 creatinine 1.52 urine drug screen positive marijuana.  Echocardiogram with ejection fraction of 65% no wall motion abnormalities.  EEG negative for seizure.  Carotid Dopplers unremarkable.  Venous Doppler studies lower extremities negative for DVT.  Follow-up cranial CT scan stable.  HPI Patient has completed both inpatient and outpatient therapy.  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  He is modified independent for all self-care and mobility.  He is back to traveling and plans to go to the beach with his wife.  He goes to the gym to workout however since last botulinum toxin injection he had increased difficulty with his grip.  We reviewed his last 2 Dysport injections.  No changes made.  He did receive injections into finger flexors.  We discussed that this was the most likely cause of the grip strength loss and that over the next month that should slowly improve.  Doing treadmill 3 d per week  09/17/22 Dysport  LEFT FCR 200 FDS 200 FDP 200 Biceps 500 BR 200 FPL 100 FCU  100  04/30/22 Dysport  LEFT FCR 200 FDS 200 FDP 200 Biceps 500 BR 200 FPL 100 FCU 100 Pain Inventory Average Pain 0 Pain Right Now 0 My pain is  n/a  In the last 24 hours, has pain interfered with the following? General activity 0 Relation with others 0 Enjoyment of life 0 What TIME of day is your pain at its worst? varies Sleep (in general) Good  Pain is worse with:  n/a Pain improves with:  n/a Relief from Meds:  n/a  No family history on file. Social History   Socioeconomic History   Marital status: Married    Spouse name: Not on file   Number of children: Not on file   Years of education: Not on file   Highest education level: Associate degree: occupational, Scientist, product/process development, or vocational program  Occupational History   Not on file  Tobacco Use   Smoking status: Never   Smokeless tobacco: Never  Vaping Use   Vaping Use: Never used  Substance and Sexual Activity   Alcohol use: Not Currently    Comment: several drinks over the weekend   Drug use: Never   Sexual activity: Not on file  Other Topics Concern   Not on file  Social History Narrative   Not on file   Social Determinants of Health   Financial Resource Strain: Low Risk  (09/27/2022)   Overall Financial Resource Strain (CARDIA)    Difficulty of Paying Living Expenses: Not hard at all  Food Insecurity: No Food Insecurity (09/27/2022)   Hunger Vital Sign  Worried About Programme researcher, broadcasting/film/video in the Last Year: Never true    Ran Out of Food in the Last Year: Never true  Transportation Needs: No Transportation Needs (09/27/2022)   PRAPARE - Administrator, Civil Service (Medical): No    Lack of Transportation (Non-Medical): No  Physical Activity: Sufficiently Active (09/27/2022)   Exercise Vital Sign    Days of Exercise per Week: 3 days    Minutes of Exercise per Session: 60 min  Stress: No Stress Concern Present (09/27/2022)   [employer] of Occupational Health - Occupational Stress  Questionnaire    Feeling of Stress : Not at all  Social Connections: Moderately Integrated (09/27/2022)   Social Connection and Isolation Panel [NHANES]    Frequency of Communication with Friends and Family: More than three times a week    Frequency of Social Gatherings with Friends and Family: Once a week    Attends Religious Services: More than 4 times per year    Active Member of [employer] or Organizations: No    Attends Engineer, structural: Not on file    Marital Status: Married   Past Surgical History:  Procedure Laterality Date   WISDOM TOOTH EXTRACTION     Past Surgical History:  Procedure Laterality Date   WISDOM TOOTH EXTRACTION     No past medical history on file. BP 123/82   Pulse 79   Ht  (1.702 m)   Wt 157 lb 12.8 oz (71.6 kg)   SpO2 97%   BMI 24.71 kg/m   Opioid Risk Score:   Fall Risk Score:  `1  Depression screen [facility] 2/9     10/01/2022    8:13 AM 09/17/2022    2:57 PM 06/27/2022    8:15 AM 04/30/2022   10:05 AM 02/05/2022   10:04 AM 12/11/2021   10:02 AM 08/31/2021    9:16 AM  Depression screen PHQ 2/9  Decreased Interest 0 0 0 0 0 0 0  Down, Depressed, Hopeless 0 0 0 0 0 0 0  PHQ - 2 Score 0 0 0 0 0 0 0     Review of Systems  All other systems reviewed and are negative.     Objective:   Physical Exam Left upper extremity tone MAS 1 finger flexor MAS 3 wrist flexor MAS 2 elbow flexor, MAS 1 thumb flexor Movement Left upper extremity 3/5 at the deltoid 4 at biceps triceps 3 at the finger flexors and extensors. Ambulates without assistive device no evidence of toe drag and instability Speech without dysarthria or aphasia       Assessment & Plan:  #1.  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  Increased grip weakness on the left side is consistent with his botulinum toxin injection and should improve over time.  No other signs of a new infarct.  At this point we will monitor  for improvement and reinject at the beginning of July with reduced dosing in the finger flexors.  Can increase the wrist flexor dose 09/17/22 Dysport  LEFT FCR 300 PL 100 FDS 100 FDP 100 Biceps 500 BR 200 FPL 100 FCU 100

## 2022-10-09 ENCOUNTER — Telehealth: Payer: Managed Care, Other (non HMO) | Admitting: Family Medicine

## 2022-10-09 ENCOUNTER — Encounter: Payer: Self-pay | Admitting: Family Medicine

## 2022-10-09 VITALS — Ht 67.0 in

## 2022-10-09 DIAGNOSIS — R7303 Prediabetes: Secondary | ICD-10-CM | POA: Diagnosis not present

## 2022-10-09 NOTE — Progress Notes (Signed)
Established Patient Office Visit   Subjective:  Patient ID: Dennis Mercado, male    DOB: 1981-01-08  Age: 42 y.o. MRN: [id number]  Chief Complaint  Patient presents with   Advice Only    Discuss Metformin patient would also like referral to nutritionist and diabetic education.     HPI Encounter Diagnoses  Name Primary?   Prediabetes Yes   For follow-up of lab work that revealed rising hemoglobin A1c.  Patient reluctant to take the metformin out of concern for stool.  It can be difficult for him to reach the bathroom in a timely manner with his partial hemiparesis.  He would like to go for diabetic and nutritional teaching   Review of Systems  Constitutional: Negative.   HENT: Negative.    Eyes:  Negative for blurred vision, discharge and redness.  Respiratory: Negative.    Cardiovascular: Negative.   Gastrointestinal:  Negative for abdominal pain.  Genitourinary: Negative.   Musculoskeletal: Negative.  Negative for myalgias.  Skin:  Negative for rash.  Neurological:  Negative for tingling, loss of consciousness and weakness.  Endo/Heme/Allergies:  Negative for polydipsia.     Current Outpatient Medications:    amLODipine (NORVASC) 10 MG tablet, TAKE 1 TABLET BY MOUTH DAILY, Disp: 90 tablet, Rfl: 1   atorvastatin (LIPITOR) 40 MG tablet, Take 1 tablet (40 mg total) by mouth daily., Disp: 90 tablet, Rfl: 1   botulinum toxin Type A (BOTOX) 200 units injection, Inject 400 Units into the muscle every 3 (three) months., Disp: 2 each, Rfl: 3   carvedilol (COREG) 25 MG tablet, TAKE 1 TABLET(25 MG) BY MOUTH TWICE DAILY WITH A MEAL, Disp: 60 tablet, Rfl: 3   DYSPORT 500 units SOLR injection, INJECT LEFT BICEPS 500 UNITS, FPL 100 UNITS, FCR 300 UNITS, PALMARIS LONGUS 100 UNITS, FDS 200 UNITS, FDP 300 UNITS. AS DIRECTED TO BE ADMINISTERED IN PRESCRIBERS OFFICE EVERY 3 MONTHS AS DIRECTED, Disp: 3 each, Rfl: 0   lisinopril (ZESTRIL) 10 MG tablet, Take 1 tablet (10 mg total) by mouth  daily., Disp: 90 tablet, Rfl: 3   tadalafil (CIALIS) 5 MG tablet, Take 5 mg by mouth daily as needed., Disp: , Rfl:    metFORMIN (GLUCOPHAGE-XR) 500 MG 24 hr tablet, Take 1 tablet (500 mg total) by mouth daily with breakfast. (Patient not taking: Reported on 10/09/2022), Disp: 90 tablet, Rfl: 1  Current Facility-Administered Medications:    AbobotulinumtoxinA (DYSPORT) 500 units injection 1,500 Units, 1,500 Units, Intramuscular, Once, Kirsteins, Victorino Sparrow, MD   sodium chloride (PF) 0.9 % injection 7.5 mL, 7.5 mL, Other, Once, Kirsteins, Victorino Sparrow, MD   Objective:     Ht  (1.702 m)   BMI 24.71 kg/m    Physical Exam Constitutional:      General: He is not in acute distress.    Appearance: Normal appearance. He is not ill-appearing, toxic-appearing or diaphoretic.  HENT:     Head: Normocephalic and atraumatic.     Right Ear: External ear normal.     Left Ear: External ear normal.  Eyes:     General: No scleral icterus.       Right eye: No discharge.        Left eye: No discharge.     Extraocular Movements: Extraocular movements intact.     Conjunctiva/sclera: Conjunctivae normal.  Pulmonary:     Effort: Pulmonary effort is normal. No respiratory distress.  Skin:    General: Skin is warm and dry.  Neurological:  Mental Status: He is alert and oriented to person, place, and time.  Psychiatric:        Mood and Affect: Mood normal.        Behavior: Behavior normal.      No results found for any visits on 10/09/22.    The ASCVD Risk score (Arnett DK, et al., 2019) failed to calculate for the following reasons:   The valid total cholesterol range is 130 to 320 mg/dL    Assessment & Plan:   Prediabetes -     Amb Referral to Nutrition and Diabetic Education    Return in about 3 months (around 01/08/2023).  Agrees to start Glucophage Exar with his evening meal.  Advised that cramps and loose stool or passing side effects from most people.  He will let me know if  there are not.  Agrees to go for diabetic nutrition counseling.  Advised follow-up in 3 months.  Advised that metformin not only treats diabetes but can prevent the progression of diabetes which is particularly important in his case.  Mliss Sax, MD   Virtual Visit via Video Note  I connected with Dennis Mercado on 10/09/22 at  9:20 AM EDT by a video enabled telemedicine application and verified that I am speaking with the correct person using two identifiers.  Location: Patient: home alone in a room.  Provider: work   I discussed the limitations of evaluation and management by telemedicine and the availability of in person appointments. The patient expressed understanding and agreed to proceed.  History of Present Illness:    Observations/Objective:   Assessment and Plan:   Follow Up Instructions:    I discussed the assessment and treatment plan with the patient. The patient was provided an opportunity to ask questions and all were answered. The patient agreed with the plan and demonstrated an understanding of the instructions.   The patient was advised to call back or seek an in-person evaluation if the symptoms worsen or if the condition fails to improve as anticipated.  I provided 25 minutes of non-face-to-face time during this encounter.   Mliss Sax, MD

## 2022-12-10 ENCOUNTER — Ambulatory Visit: Payer: Managed Care, Other (non HMO) | Admitting: Family Medicine

## 2022-12-12 ENCOUNTER — Encounter: Payer: Self-pay | Admitting: Dietician

## 2022-12-12 ENCOUNTER — Encounter: Payer: Managed Care, Other (non HMO) | Attending: Family Medicine | Admitting: Dietician

## 2022-12-12 VITALS — Ht 67.0 in | Wt 157.6 lb

## 2022-12-12 DIAGNOSIS — E119 Type 2 diabetes mellitus without complications: Secondary | ICD-10-CM | POA: Diagnosis present

## 2022-12-12 NOTE — Patient Instructions (Signed)
Goal: continue the gym 3x/wk, use desk pedal 2 days per week for 30 minutes.   Goal: drink 2 of your water bottles daily (1 first half of day, 1 second half).  When snacking, aim to include a complex carb and protein.  At meals, aim to include 1/2 plate non-starchy vegetables, 1/4 plate protein, and 1/4 plate complex carbs.

## 2022-12-12 NOTE — Progress Notes (Signed)
Diabetes Self-Management Education  Visit Type: First/Initial  Appt. Start Time: 0850 Appt. End Time: 0945  12/12/2022  [name], identified by name and date of birth, is a 42 y.o. male with a diagnosis of Diabetes: Type 2.   ASSESSMENT  History includes: type 2 diabetes, HTN, HLD. Labs noted: A1c 6.7% 10/01/22 Medications include: metformin Supplements:  Pt states he feels like he needs to know what to eat since finding out he has prediabetes.   Pt states he is taking metformin but would like to eventually be off of it.   Pt reports he had a stroke and is working on walking, weight bearing exercises, etc.   Pt states he goes to the gym 3 days per week for 60 minutes. He does upper body, then walking, then on the 3rd day freestyle (other exercises, pool, etc.).  Pt has blood glucose meter but has not started using it.   He states his wife is trying to be vegetarian so they have not been eating meat the last week.   Pt states his mom and grandma have diabetes.   Height 5\' 7"  (1.702 m), weight 157 lb 9.6 oz (71.5 kg). Body mass index is 24.68 kg/m.   Diabetes Self-Management Education - 12/12/22 0844       Visit Information   Visit Type First/Initial      Initial Visit   Diabetes Type Type 2    Date Diagnosed 10/01/22    Are you currently following a meal plan? No    Are you taking your medications as prescribed? Yes      Health Coping   How would you rate your overall health? Good      Psychosocial Assessment   Patient Belief/Attitude about Diabetes Motivated to manage diabetes    What is the hardest part about your diabetes right now, causing you the most concern, or is the most worrisome to you about your diabetes?   Making healty food and beverage choices    Self-care barriers None    Self-management support Doctor's office    Other persons present Patient    Patient Concerns Nutrition/Meal planning    Special Needs None    Preferred Learning Style  No preference indicated    Learning Readiness Ready    How often do you need to have someone help you when you read instructions, pamphlets, or other written materials from your doctor or pharmacy? 1 - Never    What is the last grade level you completed in school? some college      Pre-Education Assessment   Patient understands the diabetes disease and treatment process. Needs Instruction    Patient understands incorporating nutritional management into lifestyle. Needs Instruction    Patient undertands incorporating physical activity into lifestyle. Needs Instruction    Patient understands using medications safely. Needs Instruction    Patient understands monitoring blood glucose, interpreting and using results Needs Instruction    Patient understands prevention, detection, and treatment of acute complications. Needs Instruction    Patient understands prevention, detection, and treatment of chronic complications. Needs Instruction    Patient understands how to develop strategies to address psychosocial issues. Needs Instruction    Patient understands how to develop strategies to promote health/change behavior. Needs Instruction      Complications   Last HgB A1C per patient/outside source 6.7 %    How often do you check your blood sugar? 0 times/day (not testing)    Have you had a dilated eye  exam in the past 12 months? Yes    Have you had a dental exam in the past 12 months? Yes    Are you checking your feet? Yes      Dietary Intake   Breakfast blueberries and a kiwi and a pear OR oatmeal with blueberries    Snack (morning) pistachios    Lunch 2pm: vegetables and sometimes fish or meat    Snack (afternoon) white cheddar doritos    Dinner meat and vegetables    Snack (evening) none    Beverage(s) mushroom coffee, 16 oz water, hot tea      Activity / Exercise   Activity / Exercise Type Light (walking / raking leaves)    How many days per week do you exercise? 3    How many minutes  per day do you exercise? 60    Total minutes per week of exercise 180      Patient Education   Previous Diabetes Education No    Disease Pathophysiology Factors that contribute to the development of diabetes;Explored patient's options for treatment of their diabetes;Definition of diabetes, type 1 and 2, and the diagnosis of diabetes    Healthy Eating Role of diet in the treatment of diabetes and the relationship between the three main macronutrients and blood glucose level;Plate Method;Food label reading, portion sizes and measuring food.;Reviewed blood glucose goals for pre and post meals and how to evaluate the patients' food intake on their blood glucose level.;Meal options for control of blood glucose level and chronic complications.;Effects of alcohol on blood glucose and safety factors with consumption of alcohol.;Information on hints to eating out and maintain blood glucose control.    Being Active Role of exercise on diabetes management, blood pressure control and cardiac health.;Identified with patient nutritional and/or medication changes necessary with exercise.;Helped patient identify appropriate exercises in relation to his/her diabetes, diabetes complications and other health issue.    Medications Reviewed patients medication for diabetes, action, purpose, timing of dose and side effects.    Monitoring Identified appropriate SMBG and/or A1C goals.;Daily foot exams;Yearly dilated eye exam    Chronic complications Relationship between chronic complications and blood glucose control;Dental care;Retinopathy and reason for yearly dilated eye exams    Diabetes Stress and Support Identified and addressed patients feelings and concerns about diabetes;Worked with patient to identify barriers to care and solutions;Role of stress on diabetes    Lifestyle and Health Coping Lifestyle issues that need to be addressed for better diabetes care      Individualized Goals (developed by patient)   Nutrition  General guidelines for healthy choices and portions discussed    Physical Activity Exercise 3-5 times per week;60 minutes per day    Medications take my medication as prescribed    Monitoring  Test my blood glucose as discussed    Problem Solving Eating Pattern    Reducing Risk examine blood glucose patterns;do foot checks daily;treat hypoglycemia with 15 grams of carbs if blood glucose less than 70mg /dL    Health Coping Ask for help with psychological, social, or emotional issues      Post-Education Assessment   Patient understands the diabetes disease and treatment process. Comprehends key points    Patient understands incorporating nutritional management into lifestyle. Comprehends key points    Patient undertands incorporating physical activity into lifestyle. Comprehends key points    Patient understands using medications safely. Comphrehends key points    Patient understands monitoring blood glucose, interpreting and using results Comprehends key points  Patient understands prevention, detection, and treatment of acute complications. Comprehends key points    Patient understands prevention, detection, and treatment of chronic complications. Comprehends key points    Patient understands how to develop strategies to address psychosocial issues. Comprehends key points    Patient understands how to develop strategies to promote health/change behavior. Comprehends key points      Outcomes   Expected Outcomes Demonstrated interest in learning. Expect positive outcomes    Future DMSE 2 months    Program Status Not Completed             Individualized Plan for Diabetes Self-Management Training:   Learning Objective:  Patient will have a greater understanding of diabetes self-management. Patient education plan is to attend individual and/or group sessions per assessed needs and concerns.   Plan:   Patient Instructions  Goal: continue the gym 3x/wk, use desk pedal 2 days per week  for 30 minutes.   Goal: drink 2 of your water bottles daily (1 first half of day, 1 second half).  When snacking, aim to include a complex carb and protein.  At meals, aim to include 1/2 plate non-starchy vegetables, 1/4 plate protein, and 1/4 plate complex carbs.    Expected Outcomes:  Demonstrated interest in learning. Expect positive outcomes  Education material provided: ADA - How to Thrive: A Guide for Your Journey with Diabetes, Meal plan card, and Snack sheet  If problems or questions, patient to contact team via:  Phone  Future DSME appointment: 2 months

## 2022-12-23 ENCOUNTER — Other Ambulatory Visit: Payer: Self-pay | Admitting: Family Medicine

## 2022-12-23 DIAGNOSIS — I10 Essential (primary) hypertension: Secondary | ICD-10-CM

## 2022-12-24 ENCOUNTER — Encounter: Payer: Managed Care, Other (non HMO) | Admitting: Physical Medicine & Rehabilitation

## 2023-01-07 ENCOUNTER — Ambulatory Visit: Payer: Managed Care, Other (non HMO) | Admitting: Family Medicine

## 2023-01-09 ENCOUNTER — Other Ambulatory Visit: Payer: Self-pay | Admitting: Family Medicine

## 2023-01-09 DIAGNOSIS — I10 Essential (primary) hypertension: Secondary | ICD-10-CM

## 2023-01-21 ENCOUNTER — Encounter
Payer: Managed Care, Other (non HMO) | Attending: Physical Medicine & Rehabilitation | Admitting: Physical Medicine & Rehabilitation

## 2023-01-21 ENCOUNTER — Telehealth: Payer: Self-pay | Admitting: Family Medicine

## 2023-01-21 ENCOUNTER — Encounter: Payer: Self-pay | Admitting: Family Medicine

## 2023-01-21 ENCOUNTER — Encounter: Payer: Self-pay | Admitting: Physical Medicine & Rehabilitation

## 2023-01-21 ENCOUNTER — Other Ambulatory Visit: Payer: Self-pay

## 2023-01-21 ENCOUNTER — Ambulatory Visit: Payer: Managed Care, Other (non HMO) | Admitting: Family Medicine

## 2023-01-21 ENCOUNTER — Other Ambulatory Visit: Payer: Self-pay | Admitting: Family Medicine

## 2023-01-21 VITALS — BP 124/82 | HR 89 | Ht 67.0 in | Wt 158.0 lb

## 2023-01-21 VITALS — BP 116/72 | HR 78 | Temp 98.0°F | Ht 67.0 in | Wt 157.6 lb

## 2023-01-21 DIAGNOSIS — E1169 Type 2 diabetes mellitus with other specified complication: Secondary | ICD-10-CM | POA: Diagnosis not present

## 2023-01-21 DIAGNOSIS — I10 Essential (primary) hypertension: Secondary | ICD-10-CM

## 2023-01-21 DIAGNOSIS — Z7984 Long term (current) use of oral hypoglycemic drugs: Secondary | ICD-10-CM | POA: Diagnosis not present

## 2023-01-21 DIAGNOSIS — G8194 Hemiplegia, unspecified affecting left nondominant side: Secondary | ICD-10-CM

## 2023-01-21 DIAGNOSIS — E785 Hyperlipidemia, unspecified: Secondary | ICD-10-CM

## 2023-01-21 DIAGNOSIS — G8114 Spastic hemiplegia affecting left nondominant side: Secondary | ICD-10-CM | POA: Insufficient documentation

## 2023-01-21 DIAGNOSIS — R7303 Prediabetes: Secondary | ICD-10-CM

## 2023-01-21 LAB — BASIC METABOLIC PANEL
BUN: 12 mg/dL (ref 6–23)
CO2: 28 mEq/L (ref 19–32)
Calcium: 9.8 mg/dL (ref 8.4–10.5)
Chloride: 101 mEq/L (ref 96–112)
Creatinine, Ser: 1.14 mg/dL (ref 0.40–1.50)
GFR: 79.62 mL/min (ref >60.00)
Glucose, Bld: 85 mg/dL (ref 70–99)
Potassium: 4.5 mEq/L (ref 3.5–5.1)
Sodium: 140 mEq/L (ref 135–145)

## 2023-01-21 LAB — LDL CHOLESTEROL, DIRECT: Direct LDL: 76 mg/dL

## 2023-01-21 LAB — HEMOGLOBIN A1C: Hgb A1c MFr Bld: 6 % (ref 4.6–6.5)

## 2023-01-21 MED ORDER — AMLODIPINE BESYLATE 10 MG PO TABS
10.0000 mg | ORAL_TABLET | Freq: Every day | ORAL | 0 refills | Status: DC
Start: 2023-01-21 — End: 2023-04-25

## 2023-01-21 MED ORDER — ATORVASTATIN CALCIUM 40 MG PO TABS
40.0000 mg | ORAL_TABLET | Freq: Every day | ORAL | 3 refills | Status: DC
Start: 2023-01-21 — End: 2023-09-02

## 2023-01-21 MED ORDER — METFORMIN HCL ER 500 MG PO TB24
500.0000 mg | ORAL_TABLET | Freq: Every day | ORAL | 1 refills | Status: DC
Start: 2023-01-21 — End: 2023-06-05

## 2023-01-21 MED ORDER — ATORVASTATIN CALCIUM 40 MG PO TABS: 40.0000 mg | ORAL_TABLET | Freq: Every day | ORAL | 3 refills | Status: DC

## 2023-01-21 MED ORDER — ABOBOTULINUMTOXINA 500 UNITS IM SOLR
1500.0000 [IU] | Freq: Once | INTRAMUSCULAR | Status: AC
Start: 2023-01-21 — End: 2023-01-21
  Administered 2023-01-21: 1500 [IU] via INTRAMUSCULAR

## 2023-01-21 NOTE — Progress Notes (Signed)
Dysport Injection for spasticity using needle EMG guidance  Dilution: 200 Units/ml Indication: Severe spasticity which interferes with ADL,mobility and/or  hygiene and is unresponsive to medication management and other conservative care Informed consent was obtained after describing risks and benefits of the procedure with the patient. This includes bleeding, bruising, infection, excessive weakness, or medication side effects. A REMS form is on file and signed. Needle:  needle electrode Number of units per muscle LEFT FCR 300  FDS 100 FDP 200 Biceps 500 BR 200 FPL 100 FCU 100 All injections were done after obtaining appropriate EMG activity and after negative drawback for blood. The patient tolerated the procedure well. Post procedure instructions were given. A followup appointment was made.

## 2023-01-21 NOTE — Progress Notes (Signed)
Established Patient Office Visit   Subjective:  Patient ID: Dennis Mercado, male    DOB: 1980-09-15  Age: 42 y.o. MRN: [id number]  Chief Complaint  Patient presents with   Medical Management of Chronic Issues    3 month follow up. Pt is doing well with new Rx Metformin.     HPI Encounter Diagnoses  Name Primary?   Benign essential HTN Yes   Prediabetes    Hyperlipidemia associated with type 2 diabetes mellitus (HCC)    Left hemiparesis (HCC)    For follow-up of above.  Continues to do well.  Continues to work with physical therapy.  Blood pressure well-controlled with amlodipine, carvedilol and lisinopril.  Continues metformin 500 mg daily for prediabetes.  Continues on moderate dose atorvastatin for elevated LDL.  Continues to working out in [employer]  Worked recently with a nutritionist who is helped him adjust his diet to lower the carbohydrate and increase protein intake.   Review of Systems  Constitutional: Negative.   HENT: Negative.    Eyes:  Negative for blurred vision, discharge and redness.  Respiratory: Negative.    Cardiovascular: Negative.   Gastrointestinal:  Negative for abdominal pain.  Genitourinary: Negative.   Musculoskeletal: Negative.  Negative for myalgias.  Skin:  Negative for rash.  Neurological:  Negative for tingling, loss of consciousness and weakness.  Endo/Heme/Allergies:  Negative for polydipsia.     Current Outpatient Medications:    amLODipine (NORVASC) 10 MG tablet, TAKE 1 TABLET BY MOUTH DAILY, Disp: 90 tablet, Rfl: 0   botulinum toxin Type A (BOTOX) 200 units injection, Inject 400 Units into the muscle every 3 (three) months., Disp: 2 each, Rfl: 3   carvedilol (COREG) 25 MG tablet, TAKE 1 TABLET(25 MG) BY MOUTH TWICE DAILY WITH A MEAL, Disp: 60 tablet, Rfl: 3   DYSPORT 500 units SOLR injection, INJECT LEFT BICEPS 500 UNITS, FPL 100 UNITS, FCR 300 UNITS, PALMARIS LONGUS 100 UNITS, FDS 200 UNITS, FDP 300 UNITS. AS DIRECTED TO BE ADMINISTERED  IN PRESCRIBERS OFFICE EVERY 3 MONTHS AS DIRECTED, Disp: 3 each, Rfl: 0   lisinopril (ZESTRIL) 10 MG tablet, Take 1 tablet (10 mg total) by mouth daily., Disp: 90 tablet, Rfl: 3   metFORMIN (GLUCOPHAGE-XR) 500 MG 24 hr tablet, Take 1 tablet (500 mg total) by mouth daily with breakfast., Disp: 90 tablet, Rfl: 1   tadalafil (CIALIS) 5 MG tablet, Take 5 mg by mouth daily as needed., Disp: , Rfl:    atorvastatin (LIPITOR) 40 MG tablet, Take 1 tablet (40 mg total) by mouth daily., Disp: 90 tablet, Rfl: 3  Current Facility-Administered Medications:    AbobotulinumtoxinA (DYSPORT) 500 units injection 1,500 Units, 1,500 Units, Intramuscular, Once, Kirsteins, Victorino Sparrow, MD   sodium chloride (PF) 0.9 % injection 7.5 mL, 7.5 mL, Other, Once, Kirsteins, Victorino Sparrow, MD   Objective:     BP 116/72   Pulse 78   Temp 98 F (36.7 C)   Ht 5\' 7"  (1.702 m)   Wt 157 lb 9.6 oz (71.5 kg)   SpO2 97%   BMI 24.68 kg/m    Physical Exam Constitutional:      General: He is not in acute distress.    Appearance: Normal appearance. He is not ill-appearing, toxic-appearing or diaphoretic.  HENT:     Head: Normocephalic and atraumatic.     Right Ear: External ear normal.     Left Ear: External ear normal.  Eyes:     General: No scleral icterus.  Right eye: No discharge.        Left eye: No discharge.     Extraocular Movements: Extraocular movements intact.     Conjunctiva/sclera: Conjunctivae normal.  Cardiovascular:     Rate and Rhythm: Normal rate and regular rhythm.  Pulmonary:     Effort: Pulmonary effort is normal. No respiratory distress.     Breath sounds: Normal breath sounds.  Skin:    General: Skin is warm and dry.  Neurological:     Mental Status: He is alert and oriented to person, place, and time.  Psychiatric:        Mood and Affect: Mood normal.        Behavior: Behavior normal.      No results found for any visits on 01/21/23.    The ASCVD Risk score (Arnett DK, et al., 2019)  failed to calculate for the following reasons:   The valid total cholesterol range is 130 to 320 mg/dL    Assessment & Plan:   Benign essential HTN -     Basic metabolic panel  Prediabetes -     Basic metabolic panel -     Hemoglobin A1c  Hyperlipidemia associated with type 2 diabetes mellitus (HCC) -     Atorvastatin Calcium; Take 1 tablet (40 mg total) by mouth daily.  Dispense: 90 tablet; Refill: 3 -     LDL cholesterol, direct  Left hemiparesis (HCC)    Return in about 6 months (around 07/24/2023), or if symptoms worsen or fail to improve.  Continue current medications.  May need to adjust metformin.  Continue work with course and with nutritionist.  Mliss Sax, MD

## 2023-01-21 NOTE — Telephone Encounter (Signed)
Prescription Request  01/21/2023  LOV: 01/21/2023  What is the name of the medication or equipment?  Metformin - 2 wks left Amlodipine - 2 wks left  Pt also stated his atorvastatin was sent to [facility] and he has never used them as a pharmacy. Please remove from chart. He called to cancel shipment but they said it was already shipped out and could not be cancelled. [place]  Have you contacted your pharmacy to request a refill? Yes - told he only has a partial fill  Which pharmacy would you like this sent to?  [facility] #[id number] [place], [place] - [address] AT [facility] [address] [facility] [address] [place] [address] Phone: [phone] Fax: [phone]  Patient notified that their request is being sent to the clinical staff for review and that they should receive a response within 2 business days.   Please advise at Mobile [phone] (mobile)

## 2023-02-18 ENCOUNTER — Ambulatory Visit: Payer: Managed Care, Other (non HMO) | Admitting: Dietician

## 2023-04-22 ENCOUNTER — Other Ambulatory Visit: Payer: Self-pay | Admitting: Family

## 2023-04-22 DIAGNOSIS — I10 Essential (primary) hypertension: Secondary | ICD-10-CM

## 2023-04-25 ENCOUNTER — Telehealth: Payer: Self-pay | Admitting: Family Medicine

## 2023-04-25 ENCOUNTER — Other Ambulatory Visit: Payer: Self-pay

## 2023-04-25 DIAGNOSIS — I10 Essential (primary) hypertension: Secondary | ICD-10-CM

## 2023-04-25 MED ORDER — AMLODIPINE BESYLATE 10 MG PO TABS
10.0000 mg | ORAL_TABLET | Freq: Every day | ORAL | 0 refills | Status: DC
Start: 2023-04-25 — End: 2023-08-11

## 2023-04-25 NOTE — Telephone Encounter (Signed)
Prescription Request  04/25/2023  LOV: 01/21/2023  What is the name of the medication or equipment? amLODipine (NORVASC) 10 MG tablet [[id number]] And carvedilol (COREG) 25 MG tablet [[id number]]     Have you contacted your pharmacy to request a refill? Yes   Which pharmacy would you like this sent to?  [facility] #[id number] [place], [place] - [address] AT [facility] [place] [address] & [facility] [address] [place] [address] Phone: [phone] Fax: [phone]    Patient notified that their request is being sent to the clinical staff for review and that they should receive a response within 2 business days.   Please advise at Mobile [phone] (mobile)

## 2023-04-28 ENCOUNTER — Telehealth: Payer: Self-pay | Admitting: Family Medicine

## 2023-04-28 ENCOUNTER — Other Ambulatory Visit: Payer: Self-pay

## 2023-04-28 DIAGNOSIS — I10 Essential (primary) hypertension: Secondary | ICD-10-CM

## 2023-04-28 MED ORDER — CARVEDILOL 25 MG PO TABS
ORAL_TABLET | ORAL | 3 refills | Status: DC
Start: 2023-04-28 — End: 2023-08-27

## 2023-04-28 NOTE — Telephone Encounter (Signed)
Prescription Request  04/28/2023  LOV: 01/21/2023  What is the name of the medication or equipment? carvedilol (COREG) 25 MG tablet [[id number]]   Have you contacted your pharmacy to request a refill? No   Which pharmacy would you like this sent to?  [facility] #[id number] [place] Otto, [place] - [address] AT [facility] [address] & [facility] [address] [place] [address] Phone: [phone] Fax: [phone]    Patient notified that their request is being sent to the clinical staff for review and that they should receive a response within 2 business days.   Please advise at Mobile [phone] (mobile)

## 2023-04-29 ENCOUNTER — Encounter: Payer: Managed Care, Other (non HMO) | Admitting: Physical Medicine & Rehabilitation

## 2023-05-27 ENCOUNTER — Encounter: Payer: Managed Care, Other (non HMO) | Attending: Family Medicine | Admitting: Dietician

## 2023-05-27 ENCOUNTER — Encounter: Payer: Self-pay | Admitting: Dietician

## 2023-05-27 ENCOUNTER — Encounter: Payer: Self-pay | Admitting: Physical Medicine & Rehabilitation

## 2023-05-27 ENCOUNTER — Encounter
Payer: Managed Care, Other (non HMO) | Attending: Physical Medicine & Rehabilitation | Admitting: Physical Medicine & Rehabilitation

## 2023-05-27 VITALS — BP 116/77 | HR 84 | Ht 67.0 in | Wt 164.0 lb

## 2023-05-27 VITALS — Wt 164.0 lb

## 2023-05-27 DIAGNOSIS — G8114 Spastic hemiplegia affecting left nondominant side: Secondary | ICD-10-CM | POA: Diagnosis not present

## 2023-05-27 DIAGNOSIS — E119 Type 2 diabetes mellitus without complications: Secondary | ICD-10-CM | POA: Diagnosis present

## 2023-05-27 MED ORDER — SODIUM CHLORIDE (PF) 0.9 % IJ SOLN
7.5000 mL | Freq: Once | INTRAMUSCULAR | Status: AC
Start: 2023-05-27 — End: 2023-05-27
  Administered 2023-05-27: 7.5 mL

## 2023-05-27 MED ORDER — ABOBOTULINUMTOXINA 500 UNITS IM SOLR
1500.0000 [IU] | Freq: Once | INTRAMUSCULAR | Status: AC
Start: 2023-05-27 — End: 2023-05-27
  Administered 2023-05-27: 1500 [IU] via INTRAMUSCULAR

## 2023-05-27 NOTE — Progress Notes (Signed)
Diabetes Self-Management Education  Visit Type: Follow-up  Appt. Start Time: 0800 Appt. End Time: 0830  05/27/2023  [name], identified by name and date of birth, is a 42 y.o. male with a diagnosis of Diabetes: type 2  .   ASSESSMENT  History includes: type 2 diabetes, HTN, HLD. Labs noted: A1c 6.0% on 01/21/23 Medications include: metformin Supplements: none   A1c down from 6.7 in April to 6.0% on 01/21/23.  Pt weight today is 164 lb. Pt reports he would like to be 159 lb and states this is what he used to be and feels like he has gained some weight.   Pt states he started having more protein with breakfast, stopped eating fruit alone, and has been working on water intake.  Pt reports he continues at the gym and has been using his desk pedal and also states he got a treadmill. Pt goes to the gym M, W, and F, and now uses the treadmill while watching TV on T and Th.   Assessment of previous goals:   Goal: continue the gym 3x/wk, use desk pedal 2 days per week for 30 minutes. - goal met, continue. Pt also added in treadmill on Tues/Thurs.    Goal: drink 2 of your 24 oz water bottles daily (1 first half of day, 1 second half). - goal not met. Pt has been drinking 8 oz in morning, and a 24 oz bottle during day.  Weight 164 lb (74.4 kg). Body mass index is 25.69 kg/m.   Diabetes Self-Management Education - 05/27/23 0759       Visit Information   Visit Type Follow-up      Health Coping   How would you rate your overall health? Good      Psychosocial Assessment   Patient Belief/Attitude about Diabetes Motivated to manage diabetes    What is the hardest part about your diabetes right now, causing you the most concern, or is the most worrisome to you about your diabetes?   Making healty food and beverage choices    Self-care barriers None    Self-management support Doctor's office    Other persons present Patient    Patient Concerns Healthy Lifestyle    Special  Needs None    Preferred Learning Style No preference indicated    Learning Readiness Ready      Pre-Education Assessment   Patient understands the diabetes disease and treatment process. Needs Review    Patient understands incorporating nutritional management into lifestyle. Needs Review    Patient undertands incorporating physical activity into lifestyle. Needs Review    Patient understands using medications safely. Needs Review    Patient understands monitoring blood glucose, interpreting and using results Needs Review    Patient understands prevention, detection, and treatment of acute complications. Needs Review    Patient understands prevention, detection, and treatment of chronic complications. Needs Review    Patient understands how to develop strategies to address psychosocial issues. Needs Review    Patient understands how to develop strategies to promote health/change behavior. Needs Review      Complications   Last HgB A1C per patient/outside source 6 %      Dietary Intake   Breakfast cheese and wheat thins OR oats and greek yogurt    Snack (morning) protein shake    Lunch meat, veggies, starch    Dinner protein, starch, and veggies    Snack (evening) fruit    Beverage(s) water, tea with stevia  Activity / Exercise   Activity / Exercise Type Light (walking / raking leaves)    How many days per week do you exercise? 45    How many minutes per day do you exercise? 5    Total minutes per week of exercise 225      Patient Education   Previous Diabetes Education Yes (please comment)    Disease Pathophysiology Explored patient's options for treatment of their diabetes    Healthy Eating Role of diet in the treatment of diabetes and the relationship between the three main macronutrients and blood glucose level;Plate Method;Meal timing in regards to the patients' current diabetes medication.;Information on hints to eating out and maintain blood glucose control.    Being  Active Helped patient identify appropriate exercises in relation to his/her diabetes, diabetes complications and other health issue.    Medications Reviewed patients medication for diabetes, action, purpose, timing of dose and side effects.    Chronic complications Relationship between chronic complications and blood glucose control;Identified and discussed with patient  current chronic complications    Diabetes Stress and Support Identified and addressed patients feelings and concerns about diabetes;Worked with patient to identify barriers to care and solutions;Role of stress on diabetes    Lifestyle and Health Coping Lifestyle issues that need to be addressed for better diabetes care      Individualized Goals (developed by patient)   Nutrition Follow meal plan discussed;General guidelines for healthy choices and portions discussed    Physical Activity Exercise 5-7 days per week;30 minutes per day    Medications take my medication as prescribed    Monitoring  Test my blood glucose as discussed    Problem Solving Eating Pattern    Reducing Risk examine blood glucose patterns;do foot checks daily;treat hypoglycemia with 15 grams of carbs if blood glucose less than 70mg /dL    Health Coping Ask for help with psychological, social, or emotional issues      Patient Self-Evaluation of Goals - Patient rates self as meeting previously set goals (% of time)   Nutrition 50 - 75 % (half of the time)    Physical Activity >75% (most of the time)    Medications >75% (most of the time)    Monitoring Not Applicable    Problem Solving and behavior change strategies  50 - 75 % (half of the time)    Reducing Risk (treating acute and chronic complications) 50 - 75 % (half of the time)    Health Coping 50 - 75 % (half of the time)      Post-Education Assessment   Patient understands the diabetes disease and treatment process. Comprehends key points    Patient understands incorporating nutritional management  into lifestyle. Demonstrates understanding / competency    Patient undertands incorporating physical activity into lifestyle. Demonstrates understanding / competency    Patient understands using medications safely. Demonstrates understanding / competency    Patient understands monitoring blood glucose, interpreting and using results Comprehends key points    Patient understands prevention, detection, and treatment of acute complications. Comprehends key points    Patient understands prevention, detection, and treatment of chronic complications. Comprehends key points    Patient understands how to develop strategies to address psychosocial issues. Comprehends key points    Patient understands how to develop strategies to promote health/change behavior. Comprehends key points      Outcomes   Expected Outcomes Demonstrated interest in learning. Expect positive outcomes    Future DMSE PRN  Program Status Completed      Subsequent Visit   Since your last visit have you continued or begun to take your medications as prescribed? Yes             Individualized Plan for Diabetes Self-Management Training:   Learning Objective:  Patient will have a greater understanding of diabetes self-management. Patient education plan is to attend individual and/or group sessions per assessed needs and concerns.   Plan:   There are no Patient Instructions on file for this visit.  Expected Outcomes:  Demonstrated interest in learning. Expect positive outcomes  Education material provided: No handouts provided on this follow up.  If problems or questions, patient to contact team via:  Phone  Future DSME appointment: PRN

## 2023-05-27 NOTE — Patient Instructions (Signed)
You received a Dysport injection today. You may experience muscle pains and aches. He may apply ice 20 minutes every 2 hours as needed for the next 24-48 hours. He also noticed bleeding or bruising in the areas that were injected. May apply Band-Aid. If this bruising is extensive, please notify our office. If there is evidence of increasing redness that occurs 2-3 days after injection. Please call our office. This could be a sign of infection. It is very rare, however. You may experience some muscle weakness in the muscles and injected. This would likely start in about one week.  

## 2023-05-27 NOTE — Progress Notes (Signed)
Dysport Injection for spasticity using needle EMG guidance  Dilution: 200 Units/ml Indication: Severe spasticity which interferes with ADL,mobility and/or  hygiene and is unresponsive to medication management and other conservative care Informed consent was obtained after describing risks and benefits of the procedure with the patient. This includes bleeding, bruising, infection, excessive weakness, or medication side effects. A REMS form is on file and signed. Needle:  needle electrode Number of units per muscle LEFT FCR 300  FDS 100 FDP 200 Biceps 500 BR 200 FPL 100 FCU 100 All injections were done after obtaining appropriate EMG activity and after negative drawback for blood. The patient tolerated the procedure well. Post procedure instructions were given. A followup appointment was made.

## 2023-05-29 ENCOUNTER — Ambulatory Visit: Payer: Managed Care, Other (non HMO) | Admitting: Physical Therapy

## 2023-06-03 ENCOUNTER — Telehealth: Payer: Self-pay | Admitting: Physical Medicine & Rehabilitation

## 2023-06-03 DIAGNOSIS — G8114 Spastic hemiplegia affecting left nondominant side: Secondary | ICD-10-CM

## 2023-06-03 NOTE — Addendum Note (Signed)
Addended by: Erick Colace on: 06/03/2023 04:55 PM   Modules accepted: Orders

## 2023-06-03 NOTE — Telephone Encounter (Signed)
Patient called in requesting an OT referral , would like it sent to office location on [address]

## 2023-06-04 ENCOUNTER — Other Ambulatory Visit: Payer: Self-pay | Admitting: Family Medicine

## 2023-06-04 DIAGNOSIS — R7303 Prediabetes: Secondary | ICD-10-CM

## 2023-06-24 ENCOUNTER — Other Ambulatory Visit: Payer: Self-pay | Admitting: Family Medicine

## 2023-06-24 ENCOUNTER — Ambulatory Visit: Payer: Managed Care, Other (non HMO) | Admitting: Family Medicine

## 2023-06-24 ENCOUNTER — Ambulatory Visit: Payer: Managed Care, Other (non HMO) | Admitting: Physical Therapy

## 2023-06-24 DIAGNOSIS — I10 Essential (primary) hypertension: Secondary | ICD-10-CM

## 2023-07-08 ENCOUNTER — Ambulatory Visit: Payer: Managed Care, Other (non HMO) | Admitting: Family Medicine

## 2023-07-08 ENCOUNTER — Encounter: Payer: Self-pay | Admitting: Family Medicine

## 2023-07-08 VITALS — BP 130/88 | HR 80 | Temp 98.4°F | Ht 67.0 in | Wt 164.4 lb

## 2023-07-08 DIAGNOSIS — E782 Mixed hyperlipidemia: Secondary | ICD-10-CM | POA: Diagnosis not present

## 2023-07-08 DIAGNOSIS — I10 Essential (primary) hypertension: Secondary | ICD-10-CM

## 2023-07-08 DIAGNOSIS — R7303 Prediabetes: Secondary | ICD-10-CM | POA: Diagnosis not present

## 2023-07-08 DIAGNOSIS — G8194 Hemiplegia, unspecified affecting left nondominant side: Secondary | ICD-10-CM

## 2023-07-08 LAB — URINALYSIS, ROUTINE W REFLEX MICROSCOPIC
Bilirubin Urine: NEGATIVE
Hgb urine dipstick: NEGATIVE
Ketones, ur: NEGATIVE
Leukocytes,Ua: NEGATIVE
Nitrite: NEGATIVE
RBC / HPF: NONE SEEN (ref 0–?)
Specific Gravity, Urine: 1.01 (ref 1.000–1.030)
Total Protein, Urine: NEGATIVE
Urine Glucose: NEGATIVE
Urobilinogen, UA: 0.2 (ref 0.0–1.0)
WBC, UA: NONE SEEN (ref 0–?)
pH: 6.5 (ref 5.0–8.0)

## 2023-07-08 LAB — BASIC METABOLIC PANEL
BUN: 14 mg/dL (ref 6–23)
CO2: 31 meq/L (ref 19–32)
Calcium: 9.8 mg/dL (ref 8.4–10.5)
Chloride: 100 meq/L (ref 96–112)
Creatinine, Ser: 1.16 mg/dL (ref 0.40–1.50)
GFR: 77.72 mL/min (ref >60.00)
Glucose, Bld: 87 mg/dL (ref 70–99)
Potassium: 4.2 meq/L (ref 3.5–5.1)
Sodium: 138 meq/L (ref 135–145)

## 2023-07-08 LAB — LIPID PANEL
Cholesterol: 135 mg/dL (ref 0–200)
HDL: 41.1 mg/dL (ref >39.00)
LDL Cholesterol: 82 mg/dL (ref 0–99)
NonHDL: 93.42
Total CHOL/HDL Ratio: 3
Triglycerides: 58 mg/dL (ref 0.0–149.0)
VLDL: 11.6 mg/dL (ref 0.0–40.0)

## 2023-07-08 LAB — MICROALBUMIN / CREATININE URINE RATIO
Creatinine,U: 40.8 mg/dL
Microalb Creat Ratio: 1.7 mg/g (ref 0.0–30.0)
Microalb, Ur: <0.7 mg/dL (ref 0.0–1.9)

## 2023-07-08 LAB — HEMOGLOBIN A1C: Hgb A1c MFr Bld: 6.3 % (ref 4.6–6.5)

## 2023-07-08 NOTE — Progress Notes (Signed)
Established Patient Office Visit   Subjective:  Patient ID: Dennis Mercado, male    DOB: 11/04/1980  Age: 43 y.o. MRN: [id number]  Chief Complaint  Patient presents with   Hypertension   Hyperlipidemia   Prediabetes    Hypertension Pertinent negatives include no blurred vision.  Hyperlipidemia Pertinent negatives include no myalgias.   Encounter Diagnoses  Name Primary?   Benign essential HTN Yes   Prediabetes    Left hemiparesis (HCC)    Mixed hyperlipidemia    For follow-up of above.  Blood pressures at home are running in the 130s over 70s to 80s.  He did not take any medications this morning because he is fasting.  Continues to exercise by walking on the treadmill.  He has met with a nutritionist for better control of his pre-diabetes.   Review of Systems  Constitutional: Negative.   HENT: Negative.    Eyes:  Negative for blurred vision, discharge and redness.  Respiratory: Negative.    Cardiovascular: Negative.   Gastrointestinal:  Negative for abdominal pain.  Genitourinary: Negative.   Musculoskeletal: Negative.  Negative for myalgias.  Skin:  Negative for rash.  Neurological:  Positive for weakness. Negative for tingling and loss of consciousness.  Endo/Heme/Allergies:  Negative for polydipsia.     Current Outpatient Medications:    amLODipine (NORVASC) 10 MG tablet, Take 1 tablet (10 mg total) by mouth daily., Disp: 90 tablet, Rfl: 0   atorvastatin (LIPITOR) 40 MG tablet, Take 1 tablet (40 mg total) by mouth daily., Disp: 30 tablet, Rfl: 3   carvedilol (COREG) 25 MG tablet, TAKE 1 TABLET(25 MG) BY MOUTH TWICE DAILY WITH A MEAL, Disp: 60 tablet, Rfl: 3   DYSPORT 500 units SOLR injection, INJECT LEFT BICEPS 500 UNITS, FPL 100 UNITS, FCR 300 UNITS, PALMARIS LONGUS 100 UNITS, FDS 200 UNITS, FDP 300 UNITS. AS DIRECTED TO BE ADMINISTERED IN PRESCRIBERS OFFICE EVERY 3 MONTHS AS DIRECTED, Disp: 3 each, Rfl: 0   lisinopril (ZESTRIL) 10 MG tablet, TAKE 1 TABLET(10 MG)  BY MOUTH DAILY, Disp: 90 tablet, Rfl: 3   metFORMIN (GLUCOPHAGE-XR) 500 MG 24 hr tablet, TAKE 1 TABLET BY MOUTH DAILY WITH BREAKFAST, Disp: 30 tablet, Rfl: 1   tadalafil (CIALIS) 5 MG tablet, Take 5 mg by mouth daily as needed., Disp: , Rfl:   Current Facility-Administered Medications:    AbobotulinumtoxinA (DYSPORT) 500 units injection 1,500 Units, 1,500 Units, Intramuscular, Once, Kirsteins, Victorino Sparrow, MD   sodium chloride (PF) 0.9 % injection 7.5 mL, 7.5 mL, Other, Once, Kirsteins, Victorino Sparrow, MD   Objective:     BP 130/88   Pulse 80   Temp 98.4 F (36.9 C)   Ht 5\' 7"  (1.702 m)   Wt 164 lb 6.4 oz (74.6 kg)   SpO2 99%   BMI 25.75 kg/m  BP Readings from Last 3 Encounters:  07/08/23 130/88  05/27/23 116/77  01/21/23 124/82   Wt Readings from Last 3 Encounters:  07/08/23 164 lb 6.4 oz (74.6 kg)  05/27/23 164 lb (74.4 kg)  05/27/23 164 lb (74.4 kg)      Physical Exam Constitutional:      General: He is not in acute distress.    Appearance: Normal appearance. He is not ill-appearing, toxic-appearing or diaphoretic.  HENT:     Head: Normocephalic and atraumatic.     Right Ear: External ear normal.     Left Ear: External ear normal.     Mouth/Throat:     Mouth: Mucous membranes are  moist.     Pharynx: Oropharynx is clear. No oropharyngeal exudate or posterior oropharyngeal erythema.  Eyes:     General: No scleral icterus.       Right eye: No discharge.        Left eye: No discharge.     Extraocular Movements: Extraocular movements intact.     Conjunctiva/sclera: Conjunctivae normal.     Pupils: Pupils are equal, round, and reactive to light.  Cardiovascular:     Rate and Rhythm: Normal rate and regular rhythm.  Pulmonary:     Effort: Pulmonary effort is normal. No respiratory distress.     Breath sounds: Normal breath sounds.  Abdominal:     General: Bowel sounds are normal.  Musculoskeletal:     Cervical back: No rigidity or tenderness.  Skin:    General: Skin is  warm and dry.  Neurological:     Mental Status: He is alert and oriented to person, place, and time.  Psychiatric:        Mood and Affect: Mood normal.        Behavior: Behavior normal.      No results found for any visits on 07/08/23.    The ASCVD Risk score (Arnett DK, et al., 2019) failed to calculate for the following reasons:   The valid total cholesterol range is 130 to 320 mg/dL    Assessment & Plan:   Benign essential HTN -     Basic metabolic panel -     Microalbumin / creatinine urine ratio -     Urinalysis, Routine w reflex microscopic  Prediabetes -     Basic metabolic panel -     Hemoglobin A1c -     Microalbumin / creatinine urine ratio -     Urinalysis, Routine w reflex microscopic  Left hemiparesis (HCC)  Mixed hyperlipidemia -     Lipid panel    Return in about 6 months (around 01/05/2024).  Please continue medicines as directed.  Please check and record blood pressures periodically.  Mliss Sax, MD

## 2023-07-09 ENCOUNTER — Telehealth: Payer: Self-pay | Admitting: Family Medicine

## 2023-07-09 NOTE — Telephone Encounter (Signed)
Copied from CRM [id number]. Topic: Appointments - Transfer of Care >> Jul 09, 2023  8:14 AM Elizebeth Brooking wrote: Pt is requesting to transfer FROM: Dennis Mercado  Pt is requesting to transfer TO: [name],[name]  Reason for requested transfer: Patient stated that location is closer  It is the responsibility of the team the patient would like to transfer to (Dr. Jon Billings ) to reach out to the patient if for any reason this transfer is not acceptable.

## 2023-07-17 NOTE — Therapy (Signed)
 OUTPATIENT OCCUPATIONAL THERAPY NEURO EVALUATION  Patient Name: Dennis Mercado MRN: [id number] DOB:10/28/1980, 43 y.o., male Today's Date: 07/22/2023  PCP: Dr. Berneta REFERRING PROVIDER: Dr. Carilyn  END OF SESSION:  OT End of Session - 07/22/23 0851     Visit Number 1    Number of Visits 13    Date for OT Re-Evaluation 10/14/23    Authorization Type cigna    Authorization - Visit Number 1    OT Start Time [phone]    OT Stop Time 0926    OT Time Calculation (min) 40 min    Activity Tolerance Patient tolerated treatment well    Behavior During Therapy [facility] for tasks assessed/performed             History reviewed. No pertinent past medical history. Past Surgical History:  Procedure Laterality Date   WISDOM TOOTH EXTRACTION     Patient Active Problem List   Diagnosis Date Noted   Screening for prostate cancer 12/20/2021   Grieving 12/20/2021   Prediabetes 06/27/2021   Mixed hyperlipidemia 10/05/2020   History of CVA (cerebrovascular accident) 01/19/2020   Normocytic anemia    Benign essential HTN    Hyponatremia    Acute blood loss anemia    Hypotension due to drugs    Vascular headache    Prerenal azotemia    Sleep disturbance    Leukocytosis    Transaminitis    Marijuana abuse    Essential hypertension    Dysphagia, post-stroke    Left hemiparesis (HCC)    Acute respiratory failure (HCC)    ICH (intracerebral hemorrhage) (HCC) 12/06/2019    ONSET DATE: referral date 06/02/24  REFERRING DIAG:   G81.14 (ICD-10-CM) - Left spastic hemiplegia (HCC)    THERAPY DIAG:  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) - Plan: Ot plan of care cert/re-cert  Muscle weakness (generalized) - Plan: Ot plan of care cert/re-cert  Other disturbances of skin sensation - Plan: Ot plan of care cert/re-cert  Hemiplegia and hemiparesis following cerebral infarction affecting left non-dominant side (HCC) - Plan: Ot plan of care  cert/re-cert  Other symptoms and signs involving the nervous system - Plan: Ot plan of care cert/re-cert  Neurologic neglect syndrome  Visuospatial deficit  Rationale for Evaluation and Treatment: Rehabilitation  SUBJECTIVE:   SUBJECTIVE STATEMENT: Pt reports he wants to increase LUE function Pt accompanied by: self  PERTINENT HISTORY: Pt. presented to ED on 12/06/2019 with left side weakness and altered mental status. 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 Received inpatient rehab; was discharged 01/12/20. Pt returns to OT after dysport  on 05/27/23 by Dr. Carilyn; HTN, left inattention, apraxia   PRECAUTIONS: Fall  WEIGHT BEARING RESTRICTIONS: No  PAIN:  Are you having pain? No  FALLS: Has patient fallen in last 6 months? No  LIVING ENVIRONMENT: Lives with: lives with their family Lives in: House/apartment   PLOF: Independent  PATIENT GOALS: increase functional use of LUE  OBJECTIVE:  Note: Objective measures were completed at Evaluation unless otherwise noted.  HAND DOMINANCE: Right  ADLs: Overall ADLs: mod I Transfers/ambulation related to ADLs:   IADLs:  Light housekeeping: mod I Meal Prep: mod I  MOBILITY STATUS: Independent   FUNCTIONAL OUTCOME MEASURES: Quick Dash: 42.5% disability   AROM: LUE: shoulder flexion 110, abduction 120, elbow flexion/ extension -15/130, wrist is flexed at -40 at rest, supination/ pronation: neustral/ 70% supination, active  wrist flexion to 60. Hand is fisted at rest with only trace finger flexion.    COORDINATION: unable to perform box/ blocks with LUE   SENSATION: Light touch: WFL    MUSCLE TONE: LUE: Moderate and Hypertonic  COGNITION: Overall cognitive status: increased time required for processing and directions    VISION ASSESSMENT: Not tested, pt wears glasses for near vision      OBSERVATIONS: Pleasant  gentleman well known to this therapist from previous OT                                                                                                                             TREATMENT DATE: 07/23/23- eval completed Supine pt perfromed butterfly stretch with min-mod facilitation Gentle passive left shoulder flexion, followed by prone on elbows lifting chest, min-mod facilitation/ v.c         PATIENT EDUCATION: Education details: role of OT, potential goals Person educated: Patient Education method: Explanation Education comprehension: verbalized understanding  HOME EXERCISE PROGRAM: not issued yet   GOALS: Goals reviewed with patient? Yes  SHORT TERM GOALS: Target date: 08/19/23  I with HEP Baseline: Goal status: INITIAL  2.  I with positioning to minimize risk for contracture and to increase functional use.  Goal status: INITIAL  3.  Pt will verbalize understanding of AE/adapted strategies to maximize safety and I with ADLs/ IADLs.  Goal status: INITIAL  4.  Pt will increase LUE shoulder flexion to 115* in prep for functional reach.  Goal status: INITIAL    LONG TERM GOALS: Target date: 10/14/23  I with updated HEP  Goal status: INITIAL  2.  Pt will be able to use bilateral UE's to cut with a knife and fork with AE/ adapted strategies prn  Goal status: INITIAL  3.  Pt will use LUE as a stabilizer/ gross A at least 20% of the time Baseline: 10% only Goal status: INITIAL      ASSESSMENT:  CLINICAL IMPRESSION: Patient is a 43 y.o. male who was seen today for occupational therapy evaluation for CVA. Pt is well known to this therapist from previous occupational therapy. Pt can benefit from skilled occupational therapy to address the perfromance deficits below in order to increase LUE functional use and to I pt safety and I with ADLs/IADLS.  PERFORMANCE DEFICITS: in functional skills including ADLs, IADLs, coordination, dexterity, sensation, tone,  ROM, strength, flexibility, Fine motor control, Gross motor control, mobility, balance, decreased knowledge of precautions, decreased knowledge of use of DME, and UE functional use, cognitive skills including problem solving, thought, and understand, and psychosocial skills including coping strategies, environmental adaptation, habits, interpersonal interactions, and routines and behaviors.   IMPAIRMENTS: are limiting patient from ADLs, IADLs, rest and sleep, work, play, leisure, and social participation.   CO-MORBIDITIES: may have co-morbidities  that affects occupational performance. Patient will benefit from skilled OT to address above impairments and improve overall function.  MODIFICATION OR ASSISTANCE TO COMPLETE EVALUATION: No  modification of tasks or assist necessary to complete an evaluation.  OT OCCUPATIONAL PROFILE AND HISTORY: Detailed assessment: Review of records and additional review of physical, cognitive, psychosocial history related to current functional performance.  CLINICAL DECISION MAKING: LOW - limited treatment options, no task modification necessary  REHAB POTENTIAL: Good  EVALUATION COMPLEXITY: Low    PLAN:  OT FREQUENCY: 1x/week  OT DURATION: 12 weeksplus eval  PLANNED INTERVENTIONS: 97168 OT Re-evaluation, 97535 self care/ADL training, 02889 therapeutic exercise, 97530 therapeutic activity, 97112 neuromuscular re-education, 97140 manual therapy, 97113 aquatic therapy, 97035 ultrasound, 97018 paraffin, 02989 moist heat, 97010 cryotherapy, 97034 contrast bath, 97014 electrical stimulation unattended, 97129 Cognitive training (first 15 min), 02869 Cognitive training(each additional 15 min), 02239 Orthotics management and training, 02239 Splinting (initial encounter), [id number] Subsequent splinting/medication, passive range of motion, functional mobility training, visual/perceptual remediation/compensation, energy conservation, coping strategies training, patient/family  education, and DME and/or AE instructions  RECOMMENDED OTHER SERVICES: PT  CONSULTED AND AGREED WITH PLAN OF CARE: Patient  PLAN FOR NEXT SESSION: inital HEP   Waneda Klammer, OT 07/22/2023, 1:10 PM

## 2023-07-22 ENCOUNTER — Ambulatory Visit: Payer: Managed Care, Other (non HMO) | Admitting: Occupational Therapy

## 2023-07-22 ENCOUNTER — Ambulatory Visit: Payer: Managed Care, Other (non HMO) | Attending: Physical Medicine & Rehabilitation | Admitting: Physical Therapy

## 2023-07-22 ENCOUNTER — Encounter: Payer: Self-pay | Admitting: Occupational Therapy

## 2023-07-22 ENCOUNTER — Other Ambulatory Visit: Payer: Self-pay

## 2023-07-22 ENCOUNTER — Encounter: Payer: Self-pay | Admitting: Physical Therapy

## 2023-07-22 DIAGNOSIS — R208 Other disturbances of skin sensation: Secondary | ICD-10-CM | POA: Diagnosis present

## 2023-07-22 DIAGNOSIS — M6281 Muscle weakness (generalized): Secondary | ICD-10-CM

## 2023-07-22 DIAGNOSIS — R2681 Unsteadiness on feet: Secondary | ICD-10-CM | POA: Insufficient documentation

## 2023-07-22 DIAGNOSIS — R29818 Other symptoms and signs involving the nervous system: Secondary | ICD-10-CM | POA: Diagnosis present

## 2023-07-22 DIAGNOSIS — G8114 Spastic hemiplegia affecting left nondominant side: Secondary | ICD-10-CM | POA: Insufficient documentation

## 2023-07-22 DIAGNOSIS — I69354 Hemiplegia and hemiparesis following cerebral infarction affecting left non-dominant side: Secondary | ICD-10-CM

## 2023-07-22 DIAGNOSIS — R2689 Other abnormalities of gait and mobility: Secondary | ICD-10-CM | POA: Diagnosis present

## 2023-07-22 NOTE — Addendum Note (Signed)
Addended by: Colonel Bald on: 07/22/2023 03:51 PM   Modules accepted: Orders

## 2023-07-22 NOTE — Therapy (Signed)
 OUTPATIENT PHYSICAL THERAPY NEURO EVALUATION   Patient Name: Dennis Mercado MRN: [id number] DOB:[birth year], 43 y.o., male Today's Date: 07/22/2023   PCP: Berneta Elsie Sayre MD  REFERRING PROVIDER: Carilyn Prentice BRAVO, MD  END OF SESSION:  PT End of Session - 07/22/23 0841     Visit Number 1    Number of Visits 9    Date for PT Re-Evaluation 08/19/23    Authorization Type [employer]    Authorization Time Period 07/22/23 to 08/19/23    PT Start Time 0801    PT Stop Time 0841    PT Time Calculation (min) 40 min    Activity Tolerance Patient tolerated treatment well    Behavior During Therapy [facility] for tasks assessed/performed             History reviewed. No pertinent past medical history. Past Surgical History:  Procedure Laterality Date   WISDOM TOOTH EXTRACTION     Patient Active Problem List   Diagnosis Date Noted   Screening for prostate cancer 12/20/2021   Grieving 12/20/2021   Prediabetes 06/27/2021   Mixed hyperlipidemia 10/05/2020   History of CVA (cerebrovascular accident) 01/19/2020   Normocytic anemia    Benign essential HTN    Hyponatremia    Acute blood loss anemia    Hypotension due to drugs    Vascular headache    Prerenal azotemia    Sleep disturbance    Leukocytosis    Transaminitis    Marijuana abuse    Essential hypertension    Dysphagia, post-stroke    Left hemiparesis (HCC)    Acute respiratory failure (HCC)    ICH (intracerebral hemorrhage) (HCC) 12/06/2019    ONSET DATE: chronic   REFERRING DIAG:  Diagnosis  G81.14 (ICD-10-CM) - Left spastic hemiplegia (HCC)    THERAPY DIAG:  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) - Plan: PT plan of care cert/re-cert  Muscle weakness (generalized) - Plan: PT plan of care cert/re-cert  Unsteadiness on feet - Plan: PT plan of care cert/re-cert  Other abnormalities of gait and mobility - Plan: PT plan of care cert/re-cert  Rationale for  Evaluation and Treatment: Rehabilitation  SUBJECTIVE:                                                                                                                                                                                             SUBJECTIVE STATEMENT:  The original reason I'm coming is for the shoulder, wanted to try to some dry needling. I've been to a lot of [facility] clinics, wanted to see what PT here had to offer  while I'm here. Curbs and higher inclines are tough. I'm in the gym, also try to walk at least 30 min at speed 1.4 on the TM, doing inclines on TM at the gym too.   Pt accompanied by: self  PERTINENT HISTORY: see above   PAIN:  Are you having pain? No  PRECAUTIONS: None  RED FLAGS: None   WEIGHT BEARING RESTRICTIONS: No  FALLS: Has patient fallen in last 6 months? No  LIVING ENVIRONMENT: Lives with: lives with their spouse Lives in: House/apartment Stairs: 1 STE, one level home Has following equipment at home: Single point cane  PLOF: Independent, Independent with basic ADLs, Independent with gait, and Independent with transfers  PATIENT GOALS: tune up on high level strength and balance, see what PT here has to offer   OBJECTIVE:  Note: Objective measures were completed at Evaluation unless otherwise noted.    COGNITION: Overall cognitive status: Within functional limits for tasks assessed   SENSATION: Not tested          LOWER EXTREMITY MMT:    MMT Right Eval Left Eval  Hip flexion  3  Hip extension    Hip abduction  3  Hip adduction    Hip internal rotation    Hip external rotation    Knee flexion  4-  Knee extension  4-  Ankle dorsiflexion  4-  Ankle plantarflexion    Ankle inversion    Ankle eversion    (Blank rows = not tested)      STAIRS: Level of Assistance: Min A Stair Negotiation Technique: Step to Pattern with No Rails Number of Stairs: 4  Height of Stairs: 4 inch   Comments: Min A for balance due to  anterior balance loss, difficulty clearing steps   GAIT: Gait pattern: step to pattern, decreased arm swing- Left, decreased step length- Right, decreased stride length, circumduction- Left, Left hip hike, lateral lean- Right, and decreased trunk rotation Distance walked: in clinic distances  Assistive device utilized: None Level of assistance: Complete Independence Comments: chronic CVA related impairments, ongoing spasticity and tone   FUNCTIONAL TESTS:  Timed up and go (TUG): 16.5 seconds no device  Dynamic Gait Index: 11/26  PATIENT SURVEYS:  ABC scale 87.5%                                                                                                                              TREATMENT DATE:   07/22/23  Eval, care planning, discussion of goals of care,encouraged ongoing exercise at gym, discussed stretching appropriate for spasticity management, HR max vs RPE given medicines he's currently taking, PT focus   Nustep L7 x8 minutes during above education     PATIENT EDUCATION: Education details: see above  Person educated: Patient Education method: Programmer, Multimedia, Facilities Manager, and Handouts Education comprehension: verbalized understanding, returned demonstration, and needs further education  HOME EXERCISE PROGRAM: Already on great program, will update PRN   GOALS: Goals reviewed with patient?  Yes  SHORT TERM GOALS: Target date: 08/19/2023    Will be compliant with appropriate progressive HEP  Baseline: Goal status: INITIAL  2.  Will score at least 16 on DGI  Baseline:  Goal status: INITIAL  3.  Will complete TUG in 12 seconds or less  Baseline:  Goal status: INITIAL  4.  Will be able to navigate steps and curbs on Mod(I) basis with minimal unsteadiness, no more than distant S  Baseline:  Goal status: INITIAL  5.  Will be able to navigate incline in the back of the clinic (ascent and descent) with Mod(I) minimal unsteadiness  Baseline:  Goal status:  INITIAL  6.  Will score 100% on ABC to show subjective improvement  Baseline:  Goal status: INITIAL  7. Will be able use to use spastic L UE on machinery such as Nustep without difficulty  Goal status: initial   LONG TERM GOALS: Target date: LTG = STG      ASSESSMENT:  CLINICAL IMPRESSION: Patient is a 43 y.o. M who was seen today for physical therapy evaluation and treatment for Diagnosis  G81.14 (ICD-10-CM) - Left spastic hemiplegia (HCC)   . He is really very functional given having significant ongoing chronic L hemiplegia. Very active at the gym, but he would really benefit from a tune up for high level balance and coordination with skilled PT services. Extremely motivated to continue improving and maintain functional status.   OBJECTIVE IMPAIRMENTS: Abnormal gait, decreased balance, decreased mobility, difficulty walking, decreased strength, and impaired tone.   ACTIVITY LIMITATIONS: locomotion level  PARTICIPATION LIMITATIONS: driving, shopping, and community activity  PERSONAL FACTORS: Age and Time since onset of injury/illness/exacerbation are also affecting patient's functional outcome.   REHAB POTENTIAL: Excellent  CLINICAL DECISION MAKING: Stable/uncomplicated  EVALUATION COMPLEXITY: Low  PLAN:  PT FREQUENCY: 2x/week  PT DURATION: 4 weeks  PLANNED INTERVENTIONS: 97164- PT Re-evaluation, 97110-Therapeutic exercises, 97530- Therapeutic activity, [id number]- Neuromuscular re-education, 97535- Self Care, 02859- Manual therapy, [id number]- Gait training, and [id number]- Aquatic Therapy  PLAN FOR NEXT SESSION: try elliptical, focus on high level balance/coordination, curbs/hills. Already doing a lot of resistance training at the gym. Use RPE scale instead of HR monitoring. Requesting dry needling to his shoulder (OT not able to do this)  Josette Rough, PT, DPT 07/22/23 9:11 AM

## 2023-08-05 ENCOUNTER — Ambulatory Visit: Payer: Managed Care, Other (non HMO) | Admitting: Physical Therapy

## 2023-08-05 ENCOUNTER — Encounter: Payer: Self-pay | Admitting: Physical Therapy

## 2023-08-05 ENCOUNTER — Ambulatory Visit: Payer: Managed Care, Other (non HMO) | Admitting: Occupational Therapy

## 2023-08-05 ENCOUNTER — Encounter: Payer: Self-pay | Admitting: Occupational Therapy

## 2023-08-05 DIAGNOSIS — R208 Other disturbances of skin sensation: Secondary | ICD-10-CM

## 2023-08-05 DIAGNOSIS — I69354 Hemiplegia and hemiparesis following cerebral infarction affecting left non-dominant side: Secondary | ICD-10-CM

## 2023-08-05 DIAGNOSIS — R2681 Unsteadiness on feet: Secondary | ICD-10-CM

## 2023-08-05 DIAGNOSIS — R29818 Other symptoms and signs involving the nervous system: Secondary | ICD-10-CM

## 2023-08-05 DIAGNOSIS — M6281 Muscle weakness (generalized): Secondary | ICD-10-CM

## 2023-08-05 DIAGNOSIS — R2689 Other abnormalities of gait and mobility: Secondary | ICD-10-CM

## 2023-08-05 NOTE — Therapy (Signed)
OUTPATIENT PHYSICAL THERAPY NEURO TREATMENT   Patient Name: Dennis Mercado MRN: [id number] DOB:[birth year], 43 y.o., male Today's Date: 08/05/2023   PCP: Mliss Sax MD  REFERRING PROVIDER: Erick Colace, MD  END OF SESSION:  PT End of Session - 08/05/23 0849     Visit Number 2    Date for PT Re-Evaluation 08/19/23    PT Start Time 0845    PT Stop Time 0930    PT Time Calculation (min) 45 min    Activity Tolerance Patient tolerated treatment well    Behavior During Therapy [facility] for tasks assessed/performed             History reviewed. No pertinent past medical history. Past Surgical History:  Procedure Laterality Date   WISDOM TOOTH EXTRACTION     Patient Active Problem List   Diagnosis Date Noted   Screening for prostate cancer 12/20/2021   Grieving 12/20/2021   Prediabetes 06/27/2021   Mixed hyperlipidemia 10/05/2020   History of CVA (cerebrovascular accident) 01/19/2020   Normocytic anemia    Benign essential HTN    Hyponatremia    Acute blood loss anemia    Hypotension due to drugs    Vascular headache    Prerenal azotemia    Sleep disturbance    Leukocytosis    Transaminitis    Marijuana abuse    Essential hypertension    Dysphagia, post-stroke    Left hemiparesis (HCC)    Acute respiratory failure (HCC)    ICH (intracerebral hemorrhage) (HCC) 12/06/2019    ONSET DATE: chronic   REFERRING DIAG:  Diagnosis  G81.14 (ICD-10-CM) - Left spastic hemiplegia (HCC)    THERAPY DIAG:  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 I've been to a lot of [facility] clinics, wanted to see what PT here had to offer while I'm here. Curbs and higher inclines are tough. I'm in the gym, also try to walk at least 30 min at speed 1.4 on the TM, doing inclines on TM at the gym too.   Pt accompanied by: self  PERTINENT HISTORY: see above   PAIN:  Are you having  pain? No  PRECAUTIONS: None  RED FLAGS: None   WEIGHT BEARING RESTRICTIONS: No  FALLS: Has patient fallen in last 6 months? No  LIVING ENVIRONMENT: Lives with: lives with their spouse Lives in: House/apartment Stairs: 1 STE, one level home Has following equipment at home: Single point cane  PLOF: Independent, Independent with basic ADLs, Independent with gait, and Independent with transfers  PATIENT GOALS: tune up on high level strength and balance, see what PT here has to offer   OBJECTIVE:  Note: Objective measures were completed at Evaluation unless otherwise noted.    COGNITION: Overall cognitive status: Within functional limits for tasks assessed   SENSATION: Not tested          LOWER EXTREMITY MMT:    MMT Right Eval Left Eval  Hip flexion  3  Hip extension    Hip abduction  3  Hip adduction    Hip internal rotation    Hip external rotation    Knee flexion  4-  Knee extension  4-  Ankle dorsiflexion  4-  Ankle plantarflexion    Ankle inversion    Ankle eversion    (Blank rows = not tested)      STAIRS: Level of Assistance: Min A Stair Negotiation Technique: Step to Pattern with No Rails Number of Stairs: 4  Height of Stairs: 4 inch   Comments: Min A for balance due to anterior balance loss, difficulty clearing steps   GAIT: Gait pattern: step to pattern, decreased arm swing- Left, decreased step length- Right, decreased stride length, circumduction- Left, Left hip hike, lateral lean- Right, and decreased trunk rotation Distance walked: in  clinic distances  Assistive device utilized: None Level of assistance: Complete Independence Comments: chronic CVA related impairments, ongoing spasticity and tone   FUNCTIONAL TESTS:  Timed up and go (TUG): 16.5 seconds no device  Dynamic Gait Index: 11/26  PATIENT SURVEYS:  ABC scale 87.5%                                                                                                                              TREATMENT DATE:  08/05/23 NuStep L 5 x 6 min S2S LE on airex 2x10 AAROM Shoulder Flex standing on airex x10 Sides step on and off airex  Alt 4in box taps  20lb resisted forward walking  07/22/23  Eval, care planning, discussion of goals of care,encouraged ongoing exercise at gym, discussed stretching appropriate for spasticity management, HR max vs RPE given medicines he's currently taking, PT focus   Nustep L7 x8 minutes during above education     PATIENT EDUCATION: Education details: see above  Person educated: Patient Education method: Programmer, multimedia, Facilities manager, and Handouts Education comprehension: verbalized understanding, returned demonstration, and needs further education  HOME EXERCISE PROGRAM: Already on great program, will update PRN   GOALS: Goals reviewed with patient? Yes  SHORT TERM GOALS: Target date: 08/19/2023    Will be compliant with appropriate progressive HEP  Baseline: Goal status: INITIAL  2.  Will score at least 16 on DGI  Baseline:  Goal status: INITIAL  3.  Will complete TUG in 12 seconds or less  Baseline:  Goal status: INITIAL  4.  Will be able to navigate steps and curbs on Mod(I) basis with minimal unsteadiness, no more than distant S  Baseline:  Goal status: INITIAL  5.  Will be able to navigate incline in the back of the clinic (ascent and descent) with Mod(I) minimal unsteadiness  Baseline:  Goal status: INITIAL  6.  Will score 100% on ABC to show subjective improvement  Baseline:  Goal status: INITIAL  7.  Will be able use to use spastic L UE on machinery such as Nustep without difficulty  Goal status: initial   LONG TERM GOALS: Target date: LTG = STG      ASSESSMENT:  CLINICAL IMPRESSION: Patient is a 43 y.o. M who was seen today for physical therapy evaluation and treatment for G81.14 (ICD-10-CM) - Left spastic hemiplegia (HCC). He is really very functional given having significant ongoing chronic L hemiplegia. Session consisted of balance interventions on dynamic surfaces. Some hesitation and instability with sides tep on airex. LLE circumduction with alt box taps. Pt would really benefit from a tune up for high level balance and coordination with skilled PT services. Extremely motivated to continue improving and maintain functional status.   OBJECTIVE IMPAIRMENTS: Abnormal gait, decreased balance, decreased mobility, difficulty walking, decreased strength, and impaired tone.   ACTIVITY LIMITATIONS: locomotion level  PARTICIPATION LIMITATIONS: driving, shopping, and community activity  PERSONAL FACTORS: Age and Time since onset of injury/illness/exacerbation are also affecting patient's functional outcome.   REHAB POTENTIAL: Excellent  CLINICAL DECISION MAKING: Stable/uncomplicated  EVALUATION COMPLEXITY: Low  PLAN:  PT FREQUENCY: 2x/week  PT DURATION: 4 weeks  PLANNED INTERVENTIONS: 97164- PT Re-evaluation, 97110-Therapeutic exercises, 97530- Therapeutic activity, [id number]- Neuromuscular re-education, 97535- Self Care, 40981- Manual therapy, [id number]- Gait training, and [phone]- Aquatic Therapy  PLAN FOR NEXT SESSION: try elliptical, focus on high level balance/coordination, curbs/hills. Already doing a lot of resistance training at the gym. Use RPE scale instead of HR monitoring. Requesting dry needling to his shoulder (OT not able to do this)  Nedra Hai, PT, DPT 08/05/23 8:50 AM

## 2023-08-05 NOTE — Therapy (Signed)
OUTPATIENT OCCUPATIONAL THERAPY NEURO Treatment  Patient Name: Dennis Mercado MRN: [id number] DOB:Dec 19, 1980, 43 y.o., male Today's Date: 08/05/2023  PCP: Dr. Doreene Burke REFERRING PROVIDER: Dr. Wynn Banker  END OF SESSION:    History reviewed. No pertinent past medical history. Past Surgical History:  Procedure Laterality Date   WISDOM TOOTH EXTRACTION     Patient Active Problem List   Diagnosis Date Noted   Screening for prostate cancer 12/20/2021   Grieving 12/20/2021   Prediabetes 06/27/2021   Mixed hyperlipidemia 10/05/2020   History of CVA (cerebrovascular accident) 01/19/2020   Normocytic anemia    Benign essential HTN    Hyponatremia    Acute blood loss anemia    Hypotension due to drugs    Vascular headache    Prerenal azotemia    Sleep disturbance    Leukocytosis    Transaminitis    Marijuana abuse    Essential hypertension    Dysphagia, post-stroke    Left hemiparesis (HCC)    Acute respiratory failure (HCC)    ICH (intracerebral hemorrhage) (HCC) 12/06/2019    ONSET DATE: referral date 06/02/24  REFERRING DIAG:   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 presented to ED on 12/06/2019 with left side weakness and altered mental status. 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 Received inpatient rehab; was discharged 01/12/20. Pt returns to OT after dysport on 05/27/23  by Dr. Wynn Banker; HTN, left inattention, apraxia   PRECAUTIONS: Fall  WEIGHT BEARING RESTRICTIONS: No  PAIN:  Are you having pain? No  FALLS: Has patient fallen in last 6 months? No  LIVING ENVIRONMENT: Lives with: lives with their family Lives in: House/apartment   PLOF: Independent  PATIENT GOALS: increase functional use of LUE  OBJECTIVE:  Note: Objective measures were completed at Evaluation unless otherwise noted.  HAND DOMINANCE: Right  ADLs: Overall ADLs: mod I Transfers/ambulation related to ADLs:   IADLs:  Light housekeeping: mod I Meal Prep: mod I  MOBILITY STATUS: Independent   FUNCTIONAL OUTCOME MEASURES: Quick Dash: 42.5% disability   AROM: LUE: shoulder flexion 110, abduction 120, elbow flexion/ extension -15/130, wrist is flexed at -40 at rest, supination/ pronation: neustral/ 70% supination, active wrist flexion to 60. Hand is fisted at rest with only trace finger flexion.    COORDINATION: unable to perform box/ blocks with LUE   SENSATION: Light touch: WFL    MUSCLE TONE: LUE: Moderate and Hypertonic  COGNITION: Overall cognitive status: increased time required for processing and directions    VISION ASSESSMENT: Not tested, pt wears glasses for near vision      OBSERVATIONS: Pleasant gentleman well known to this therapist from previous OT  TREATMENT DATE: 08/05/23- Pt brought in a pre-fab splint he purchased online. Overll the splint fits well with the exception of the hand portion of the splint is a little short and allows DIP's to remain flexed. Pt reports he has additional splints that he will bring in. Pt may benefit from a new custom splint for improved positioning and DIP extension. Therapist discussed this with patient. Weightbearing through left hand and elbow on mat, mod facilitation/ v.c for  weightshifting over to left side.  Pt was cautioned against over stretching digits and wrist and shown how to gently strech them in a better position. Prone on elbows lifting chest, min-mod facilitation/ v.c Followed by tall knneing rolling ball frowards and back for core stability and UE stretch, mod facilitation/ v.c Quadraped with L hand over ball supported by therapist for increased weightbearing and vcore stability, mod facilitation. v.c Seated edge of mat closed chains shoulder flexion with PVC frame reaching for floor, followed by resting frame on knees for elbow flexion/ extension, min facilitation/ v.c   07/23/23- eval completed Supine pt perfromed butterfly stretch with min-mod facilitation Gentle passive left shoulder flexion, followed by prone on elbows lifting chest, min-mod facilitation/ v.c         PATIENT EDUCATION: Education details: inital HEP- see pt instructions. Person educated: Patient Education method: Explanation, demonstration, v.c , handout Education comprehension: verbalized understanding, returned dmeonstration  HOME EXERCISE PROGRAM: not issued yet   GOALS: Goals reviewed with patient? Yes  SHORT TERM GOALS: Target date: 08/19/23  I with HEP Baseline: Goal status: INITIAL  2.  I with positioning to minimize risk for contracture and to increase functional use.  Goal status: INITIAL  3.  Pt will verbalize understanding of AE/adapted strategies to maximize safety and I with ADLs/ IADLs.  Goal status: INITIAL  4.  Pt will increase LUE shoulder flexion to 115* in prep for functional reach.  Goal status: INITIAL    LONG TERM GOALS: Target date: 10/14/23  I with updated HEP  Goal status: INITIAL  2.  Pt will be able to use bilateral UE's to cut with a knife and fork with AE/ adapted strategies prn  Goal status: INITIAL  3.  Pt will use LUE as a stabilizer/ gross A at least 20% of the time Baseline: 10% only Goal status:  INITIAL      ASSESSMENT:  CLINICAL IMPRESSION:Pt is progressing towards goals. He is motivated to improve. Pt demonstrates understanding of inital exercises. HE may benefit from reinforcement. PERFORMANCE DEFICITS: in functional skills including ADLs, IADLs, coordination, dexterity, sensation, tone, ROM, strength, flexibility, Fine motor control, Gross motor control, mobility, balance, decreased knowledge of precautions, decreased knowledge of use of DME, and UE functional use, cognitive skills including problem solving, thought, and understand, and psychosocial skills including coping strategies, environmental adaptation, habits, interpersonal interactions, and routines and behaviors.   IMPAIRMENTS: are limiting patient from ADLs, IADLs, rest and sleep, work, play, leisure, and social participation.   CO-MORBIDITIES: may have co-morbidities  that affects occupational performance. Patient will benefit from skilled OT to address above impairments and improve overall function.  MODIFICATION OR ASSISTANCE TO COMPLETE EVALUATION: No modification of tasks or assist necessary to complete an evaluation.  OT OCCUPATIONAL PROFILE AND HISTORY: Detailed assessment: Review of records and additional review of physical, cognitive, psychosocial history related to current functional performance.  CLINICAL DECISION MAKING: LOW - limited treatment options, no task modification necessary  REHAB POTENTIAL: Good  EVALUATION COMPLEXITY: Low    PLAN:  OT FREQUENCY: 1x/week  OT DURATION: 12 weeksplus eval  PLANNED INTERVENTIONS: 97168 OT Re-evaluation, 97535 self care/ADL training, 60454 therapeutic exercise, 97530 therapeutic activity, 97112 neuromuscular re-education, 97140 manual therapy, 97113 aquatic therapy, 97035 ultrasound, 97018 paraffin, 09811 moist heat, 97010 cryotherapy, 97034 contrast bath, 97014 electrical stimulation unattended, 97129 Cognitive training (first 15 min), 91478 Cognitive  training(each additional 15 min), 29562 Orthotics management and training, 13086 Splinting (initial encounter), [id number] Subsequent splinting/medication, passive range of motion, functional mobility training, visual/perceptual remediation/compensation, energy conservation, coping strategies training, patient/family education, and DME and/or AE instructions  RECOMMENDED OTHER SERVICES: PT  CONSULTED AND AGREED WITH PLAN OF CARE: Patient  PLAN FOR NEXT SESSION: NMR, assess splinting needs   Medina Degraffenreid, OT 08/05/2023, 10:36 AM

## 2023-08-05 NOTE — Patient Instructions (Signed)
Shoulder Push-Up (Prone on Elbows)    With elbows placed under shoulders, rise up on elbows as high as possible. Keep hips on surface and back arched. Hold __10__ seconds. Repeat ___10_ times. Do ___1-2_ sessions per day.  Copyright  [employer]HI. All rights reserved.  Lateral Weight Shift: Upper Trunk Leading   Sit with feet flat on floor. Bring _left___ shoulder, head and arm toward side until forearm/elbow just touches sitting surface, weight bear through left elbow. Hold __10__ seconds. Return to upright position by pushing through arm Repeat _10___ times per session. Do __2__ sessions per day.   Forward Upper Body Weight Shift  [email or web address]     .

## 2023-08-09 ENCOUNTER — Other Ambulatory Visit: Payer: Self-pay | Admitting: Family Medicine

## 2023-08-09 DIAGNOSIS — I10 Essential (primary) hypertension: Secondary | ICD-10-CM

## 2023-08-09 DIAGNOSIS — R7303 Prediabetes: Secondary | ICD-10-CM

## 2023-08-18 NOTE — Therapy (Signed)
 OUTPATIENT OCCUPATIONAL THERAPY NEURO Treatment  Patient Name: Dennis Mercado MRN: [id number] DOB:[birth year], 43 y.o., male Today's Date: 08/19/2023  PCP: Dr. Doreene Burke REFERRING PROVIDER: Dr. Wynn Banker  END OF SESSION:  OT End of Session - 08/19/23 0918     Visit Number 3    Number of Visits 13    Date for OT Re-Evaluation 10/14/23    Authorization Type cigna    Authorization - Visit Number 3    OT Start Time 0804    OT Stop Time 0845    OT Time Calculation (min) 41 min              History reviewed. No pertinent past medical history. Past Surgical History:  Procedure Laterality Date   WISDOM TOOTH EXTRACTION     Patient Active Problem List   Diagnosis Date Noted   Screening for prostate cancer 12/20/2021   Grieving 12/20/2021   Prediabetes 06/27/2021   Mixed hyperlipidemia 10/05/2020   History of CVA (cerebrovascular accident) 01/19/2020   Normocytic anemia    Benign essential HTN    Hyponatremia    Acute blood loss anemia    Hypotension due to drugs    Vascular headache    Prerenal azotemia    Sleep disturbance    Leukocytosis    Transaminitis    Marijuana abuse    Essential hypertension    Dysphagia, post-stroke    Left hemiparesis (HCC)    Acute respiratory failure (HCC)    ICH (intracerebral hemorrhage) (HCC) 12/06/2019    ONSET DATE: referral date 06/02/24  REFERRING DIAG:   G81.14 (ICD-10-CM) - Left spastic hemiplegia (HCC)    THERAPY DIAG:  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 presented to ED on 12/06/2019 with left side weakness and altered mental status. 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 Received inpatient rehab; was discharged 01/12/20. Pt returns to OT after dysport on 05/27/23 by Dr. Wynn Banker; HTN, left inattention, apraxia   PRECAUTIONS: Fall  WEIGHT BEARING RESTRICTIONS: No  PAIN:  Are you having pain? No  FALLS: Has patient fallen in last 6 months? No  LIVING ENVIRONMENT: Lives with: lives with their family Lives in: House/apartment   PLOF: Independent  PATIENT GOALS: increase functional use of LUE  OBJECTIVE:  Note: Objective measures were completed at Evaluation unless otherwise noted.  HAND DOMINANCE: Right  ADLs: Overall ADLs: mod I Transfers/ambulation related to ADLs:   IADLs:  Light housekeeping: mod I Meal Prep: mod I  MOBILITY STATUS: Independent   FUNCTIONAL OUTCOME MEASURES: Quick Dash: 42.5% disability   AROM: LUE: shoulder flexion 110, abduction 120, elbow flexion/ extension -15/130, wrist is flexed at -40 at rest, supination/ pronation: neustral/ 70% supination, active wrist flexion to 60. Hand is fisted at rest with only trace finger flexion.    COORDINATION: unable to perform box/ blocks with LUE   SENSATION: Light touch: WFL    MUSCLE TONE: LUE: Moderate and Hypertonic  COGNITION: Overall cognitive status: increased time required for processing and directions    VISION  ASSESSMENT: Not tested, pt wears glasses for near vision      OBSERVATIONS: Pleasant gentleman well known to this therapist from previous OT                                                                                                                             TREATMENT DATE:08/19/23 Weightbearing through left hand and elbow on mat, mod facilitation/ v.c for weightshifting over to left side.   Prone  on elbows and knees then with hips on mat lifting chest, min-mod facilitation/ v.c Followed by tall kneeling rolling ball frowards and back for core stability and UE stretch, mod facilitation/ v.c, modified cat and cow withh elbows supported on ball Supine chest press and shoulder flexion with PVC pipe frame, min-mod facilitation initally and min v.c Gentle passive stretch to digits and wrist  in extension VMS 50 pps, 200 usec, 10 secs cycle, 5 sec ramp, intensity 36 x 8 mins for finger and wrist extensors, mod facilitation/ v.c, no adverse reactions. Pt's fingers and wrist were losser after use. Pt was noted to have increased finger ext during on cycle. Therapist kept on lower stetting to avoid clawing Pt has estim at home, therapist recommends pt brings in for proper placement/ use.  08/05/23- Pt brought in a pre-fab splint he purchased online. Overall the splint fits well with the exception of the hand portion of the splint is a little short and allows DIP's to remain flexed. Pt reports he has additional splints that he will bring in. Pt may benefit from a new custom splint for improved positioning and DIP extension. Therapist discussed this with patient. Weightbearing through left hand and elbow on mat, mod facilitation/ v.c for weightshifting over to left side.  Pt was cautioned against over stretching digits and wrist and shown how to gently strech them in a better position. Prone on elbows lifting chest, min-mod facilitation/ v.c Followed by tall kneeling rolling ball frowards and back for core stability and UE stretch, mod facilitation/ v.c Quadraped with L hand over ball supported by therapist for increased weightbearing and core stability, mod facilitation. v.c Seated edge of mat closed chains shoulder flexion with PVC frame reaching for floor, followed by resting frame on knees for elbow flexion/ extension, min facilitation/ v.c   07/23/23- eval completed Supine pt perfromed butterfly  stretch with min-mod facilitation Gentle passive left shoulder flexion, followed by prone on elbows lifting chest, min-mod facilitation/ v.c         PATIENT EDUCATION: Education details:see above Person educated: Patient Education method: Explanation, demonstration, v.c , Education comprehension: verbalized understanding, returned dmeonstration  HOME EXERCISE PROGRAM: not issued yet   GOALS: Goals reviewed with patient? Yes  SHORT TERM GOALS: Target date: 08/19/23  I with HEP Baseline: Goal status:  ongoing  2.  I with positioning to minimize risk for contracture and to increase functional use.  Goal status:  ongoing, pt to bring in his additional splints 08/19/23  3.  Pt will verbalize understanding of AE/adapted strategies to maximize safety and I with ADLs/ IADLs.  Goal status:  ongoing  4.  Pt will increase LUE shoulder flexion to 115* in prep for functional reach.  Goal status:  ongoing    LONG TERM GOALS: Target date: 10/14/23  I with updated HEP  Goal status: INITIAL  2.  Pt will be able to use bilateral UE's to cut with a knife and fork with AE/ adapted strategies prn  Goal status: INITIAL  3.  Pt will use LUE as a stabilizer/ gross A at least 20% of the time Baseline: 10% only Goal status: INITIAL      ASSESSMENT:  CLINICAL IMPRESSION:Pt is progressing towards goals. He is limited by spasticity. Pt reports he plans to f/u with his MD regarding botox. Pt to bring in estim unit from home next visit as well as additional splints.PERFORMANCE DEFICITS: in functional skills including ADLs, IADLs, coordination, dexterity, sensation, tone, ROM, strength, flexibility, Fine motor control, Gross motor control, mobility, balance, decreased knowledge of precautions, decreased knowledge of use of DME, and UE functional use, cognitive skills including problem solving, thought, and understand, and psychosocial skills including coping strategies, environmental  adaptation, habits, interpersonal interactions, and routines and behaviors.   IMPAIRMENTS: are limiting patient from ADLs, IADLs, rest and sleep, work, play, leisure, and social participation.   CO-MORBIDITIES: may have co-morbidities  that affects occupational performance. Patient will benefit from skilled OT to address above impairments and improve overall function.  MODIFICATION OR ASSISTANCE TO COMPLETE EVALUATION: No modification of tasks or assist necessary to complete an evaluation.  OT OCCUPATIONAL PROFILE AND HISTORY: Detailed assessment: Review of records and additional review of physical, cognitive, psychosocial history related to current functional performance.  CLINICAL DECISION MAKING: LOW - limited treatment options, no task modification necessary  REHAB POTENTIAL: Good  EVALUATION COMPLEXITY: Low    PLAN:  OT FREQUENCY: 1x/week  OT DURATION: 12 weeksplus eval  PLANNED INTERVENTIONS: 97168 OT Re-evaluation, 97535 self care/ADL training, 16109 therapeutic exercise, 97530 therapeutic activity, 97112 neuromuscular re-education, 97140 manual therapy, 97113 aquatic therapy, 97035 ultrasound, 97018 paraffin, 60454 moist heat, 97010 cryotherapy, 97034 contrast bath, 97014 electrical stimulation unattended, 97129 Cognitive training (first 15 min), 09811 Cognitive training(each additional 15 min), 91478 Orthotics management and training, 29562 Splinting (initial encounter), [id number] Subsequent splinting/medication, passive range of motion, functional mobility training, visual/perceptual remediation/compensation, energy conservation, coping strategies training, patient/family education, and DME and/or AE instructions  RECOMMENDED OTHER SERVICES: PT  CONSULTED AND AGREED WITH PLAN OF CARE: Patient  PLAN FOR NEXT SESSION: assess splinting needs, education regarding placement of electrodes for home estim unit.   Yvan Dority, OT 08/19/2023, 9:20 AM

## 2023-08-19 ENCOUNTER — Encounter: Payer: Self-pay | Admitting: Occupational Therapy

## 2023-08-19 ENCOUNTER — Ambulatory Visit: Payer: Managed Care, Other (non HMO) | Attending: Physical Medicine & Rehabilitation | Admitting: Occupational Therapy

## 2023-08-19 ENCOUNTER — Ambulatory Visit: Payer: Managed Care, Other (non HMO) | Admitting: Physical Therapy

## 2023-08-19 ENCOUNTER — Encounter: Payer: Self-pay | Admitting: Physical Therapy

## 2023-08-19 DIAGNOSIS — M6281 Muscle weakness (generalized): Secondary | ICD-10-CM

## 2023-08-19 DIAGNOSIS — R208 Other disturbances of skin sensation: Secondary | ICD-10-CM | POA: Diagnosis present

## 2023-08-19 DIAGNOSIS — R2681 Unsteadiness on feet: Secondary | ICD-10-CM | POA: Insufficient documentation

## 2023-08-19 DIAGNOSIS — I69354 Hemiplegia and hemiparesis following cerebral infarction affecting left non-dominant side: Secondary | ICD-10-CM | POA: Diagnosis present

## 2023-08-19 DIAGNOSIS — R29818 Other symptoms and signs involving the nervous system: Secondary | ICD-10-CM | POA: Diagnosis present

## 2023-08-19 DIAGNOSIS — R2689 Other abnormalities of gait and mobility: Secondary | ICD-10-CM | POA: Diagnosis present

## 2023-08-19 DIAGNOSIS — R414 Neurologic neglect syndrome: Secondary | ICD-10-CM | POA: Insufficient documentation

## 2023-08-19 NOTE — Therapy (Signed)
 OUTPATIENT PHYSICAL THERAPY NEURO TREATMENT   Patient Name: Dennis Mercado MRN: [id number] DOB:1981/06/11, 43 y.o., male Today's Date: 08/19/2023   PCP: Mliss Sax MD  REFERRING PROVIDER: Erick Colace, MD  END OF SESSION:  PT End of Session - 08/19/23 0851     Visit Number 3    Date for PT Re-Evaluation 08/19/23    PT Start Time 0848    PT Stop Time 0930    PT Time Calculation (min) 42 min    Activity Tolerance Patient tolerated treatment well    Behavior During Therapy [facility] for tasks assessed/performed             History reviewed. No pertinent past medical history. Past Surgical History:  Procedure Laterality Date   WISDOM TOOTH EXTRACTION     Patient Active Problem List   Diagnosis Date Noted   Screening for prostate cancer 12/20/2021   Grieving 12/20/2021   Prediabetes 06/27/2021   Mixed hyperlipidemia 10/05/2020   History of CVA (cerebrovascular accident) 01/19/2020   Normocytic anemia    Benign essential HTN    Hyponatremia    Acute blood loss anemia    Hypotension due to drugs    Vascular headache    Prerenal azotemia    Sleep disturbance    Leukocytosis    Transaminitis    Marijuana abuse    Essential hypertension    Dysphagia, post-stroke    Left hemiparesis (HCC)    Acute respiratory failure (HCC)    ICH (intracerebral hemorrhage) (HCC) 12/06/2019    ONSET DATE: chronic   REFERRING DIAG:  Diagnosis  G81.14 (ICD-10-CM) - Left spastic hemiplegia (HCC)    THERAPY DIAG:  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 I've been to a lot of [facility] clinics, wanted to see what PT here had to offer while I'm here. Curbs and higher inclines are tough. I'm in the gym, also try to walk at least 30 min at speed 1.4 on the TM, doing inclines on TM at the gym too.   Pt accompanied by: self  PERTINENT HISTORY: see above  PAIN:  Are you having pain? No  PRECAUTIONS: None  RED FLAGS: None   WEIGHT BEARING RESTRICTIONS: No  FALLS: Has patient fallen in last 6 months? No  LIVING ENVIRONMENT: Lives with: lives with their spouse Lives in: House/apartment Stairs: 1 STE, one level home Has following equipment at home: Single point cane  PLOF: Independent, Independent with basic ADLs, Independent with gait, and Independent with transfers  PATIENT GOALS: tune up on high level strength and balance, see what PT here has to offer   OBJECTIVE:  Note: Objective measures were completed at Evaluation unless otherwise noted.    COGNITION: Overall cognitive status: Within functional limits for tasks assessed   SENSATION: Not tested          LOWER EXTREMITY MMT:    MMT Right Eval Left Eval  Hip flexion  3  Hip extension    Hip abduction  3  Hip adduction    Hip internal rotation    Hip external rotation    Knee flexion  4-  Knee extension  4-  Ankle dorsiflexion  4-  Ankle plantarflexion    Ankle inversion    Ankle eversion    (Blank rows = not tested)      STAIRS: Level of Assistance: Min A Stair Negotiation Technique: Step to Pattern with No Rails Number of Stairs: 4  Height of Stairs: 4 inch   Comments: Min A for balance due to anterior balance loss, difficulty clearing steps   GAIT: Gait pattern: step to pattern, decreased arm swing- Left, decreased step length- Right, decreased stride length, circumduction- Left, Left hip hike, lateral lean-  Right, and decreased trunk rotation Distance walked: in clinic distances  Assistive device utilized: None Level of assistance: Complete Independence Comments: chronic CVA related impairments, ongoing spasticity and tone   FUNCTIONAL TESTS:  Timed up and go (TUG): 16.5 seconds no device  Dynamic Gait Index: 11/26  PATIENT SURVEYS:  ABC scale 87.5%                                                                                                                              TREATMENT DATE:  08/19/23 NuStep L4 x 6 min CHECKED GOALS  Outdoor ambulation up and down curbs, through front middle [employer] LE on airex 2x10 Sides step on and off airex  Shoulder Ext green 2x10 AAROM Flex dowel  08/05/23 NuStep L 5 x 6 min S2S LE on airex 2x10 AAROM Shoulder Flex standing on airex x10 Sides step on and off airex  Alt 4in box taps  20lb resisted forward walking  07/22/23  Eval, care planning, discussion of goals of care,encouraged ongoing exercise at gym, discussed stretching appropriate for spasticity management, HR max vs RPE given medicines he's currently taking, PT focus   Nustep L7 x8 minutes during above education     PATIENT EDUCATION: Education details: see above  Person educated: Patient Education method: Explanation, Demonstration, and Handouts Education comprehension:  verbalized understanding, returned demonstration, and needs further education  HOME EXERCISE PROGRAM: Already on great program, will update PRN   GOALS: Goals reviewed with patient? Yes  SHORT TERM GOALS: Target date: 08/19/2023    Will be compliant with appropriate progressive HEP  Baseline: Goal status: Met 08/19/23 goes to the gym  2.  Will score at least 16 on DGI  Baseline:  Goal status: INITIAL  3.  Will complete TUG in 12 seconds or less  Baseline:  Goal status: 08/19/23 Met 11:91   4.  Will be able to navigate steps and curbs on Mod(I) basis with minimal unsteadiness, no more than distant S   Baseline:  Goal status: Progressing 08/19/23 SBA to CGA  5.  Will be able to navigate incline in the back of the clinic (ascent and descent) with Mod(I) minimal unsteadiness  Baseline:  Goal status: Progressing 08/19/23  6.  Will score 100% on ABC to show subjective improvement  Baseline:  Goal status: 89.4% 08/19/23  7. Will be able use to use spastic L UE on machinery such as Nustep without difficulty  Goal status: Met 08/19/23 uses the machine at the gym   LONG TERM GOALS: Target date: LTG = STG      ASSESSMENT:  CLINICAL IMPRESSION: Patient is a 43 y.o. M who was seen today for physical therapy evaluation and treatment for G81.14 (ICD-10-CM) - Left spastic hemiplegia (HCC). He is really very functional given having significant ongoing chronic L hemiplegia. He had progressed towards and met some goals. Pt very hesitant to attempt curbs and with ambulation down hills. Pt would really benefit from a tune up for high level balance and coordination with skilled PT services. Extremely motivated to continue improving and maintain functional status.   OBJECTIVE IMPAIRMENTS: Abnormal gait, decreased balance, decreased mobility, difficulty walking, decreased strength, and impaired tone.   ACTIVITY LIMITATIONS: locomotion level  PARTICIPATION LIMITATIONS: driving, shopping, and community activity  PERSONAL FACTORS: Age and Time since onset of injury/illness/exacerbation are also affecting patient's functional outcome.   REHAB POTENTIAL: Excellent  CLINICAL DECISION MAKING: Stable/uncomplicated  EVALUATION COMPLEXITY: Low  PLAN:  PT FREQUENCY: 2x/week  PT DURATION: 4 weeks  PLANNED INTERVENTIONS: 97164- PT Re-evaluation, 97110-Therapeutic exercises, 97530- Therapeutic activity, [id number]- Neuromuscular re-education, 97535- Self Care, 16109- Manual therapy, [id number]- Gait training, and [phone]- Aquatic Therapy  PLAN FOR NEXT SESSION: try elliptical, focus on high level balance/coordination,  curbs/hills. Already doing a lot of resistance training at the gym. Use RPE scale instead of HR monitoring. Requesting dry needling to his shoulder (OT not able to do this)  Debroah Baller, PTA 08/19/23 8:52 AM

## 2023-08-26 ENCOUNTER — Other Ambulatory Visit: Payer: Self-pay | Admitting: Family Medicine

## 2023-08-26 DIAGNOSIS — I10 Essential (primary) hypertension: Secondary | ICD-10-CM

## 2023-08-27 ENCOUNTER — Other Ambulatory Visit: Payer: Self-pay | Admitting: Family Medicine

## 2023-08-27 DIAGNOSIS — I10 Essential (primary) hypertension: Secondary | ICD-10-CM

## 2023-08-27 MED ORDER — CARVEDILOL 25 MG PO TABS: ORAL_TABLET | ORAL | 3 refills | Status: DC

## 2023-08-27 NOTE — Telephone Encounter (Signed)
 Copied from CRM [id number]. Topic: Clinical - Medication Refill >> Aug 27, 2023  8:03 AM Truddie Crumble wrote: Most Recent Primary Care Visit:  Provider: Mliss Sax  Department: [facility]  Visit Type: OFFICE VISIT  Date: 07/08/2023  Medication: carvedilol (COREG) 25 MG tablet  Has the patient contacted their pharmacy? Yes (Agent: If no, request that the patient contact the pharmacy for the refill. If patient does not wish to contact the pharmacy document the reason why and proceed with request.) (Agent: If yes, when and what did the pharmacy advise?)  Is this the correct pharmacy for this prescription? Yes If no, delete pharmacy and type the correct one.  This is the patient's preferred pharmacy:  [id number] [place], [place] - [address] AT [facility] OF [address] & [facility] [address] [place] [address] Phone: [phone] Fax: [phone]   Has the prescription been filled recently? Yes  Is the patient out of the medication? Yes  Has the patient been seen for an appointment in the last year OR does the patient have an upcoming appointment? Yes  Can we respond through MyChart? Yes  Agent: Please be advised that Rx refills may take up to 3 business days. We ask that you follow-up with your pharmacy.

## 2023-09-02 ENCOUNTER — Ambulatory Visit: Payer: Managed Care, Other (non HMO) | Admitting: Internal Medicine

## 2023-09-02 ENCOUNTER — Ambulatory Visit: Payer: Managed Care, Other (non HMO) | Admitting: Physical Therapy

## 2023-09-02 ENCOUNTER — Encounter: Payer: Self-pay | Admitting: Internal Medicine

## 2023-09-02 ENCOUNTER — Ambulatory Visit: Payer: Managed Care, Other (non HMO) | Admitting: Occupational Therapy

## 2023-09-02 ENCOUNTER — Encounter: Payer: Self-pay | Admitting: Physical Therapy

## 2023-09-02 VITALS — BP 118/80 | HR 88 | Temp 98.2°F | Ht 67.0 in | Wt 164.4 lb

## 2023-09-02 DIAGNOSIS — E782 Mixed hyperlipidemia: Secondary | ICD-10-CM

## 2023-09-02 DIAGNOSIS — R270 Ataxia, unspecified: Secondary | ICD-10-CM

## 2023-09-02 DIAGNOSIS — E119 Type 2 diabetes mellitus without complications: Secondary | ICD-10-CM

## 2023-09-02 DIAGNOSIS — Z8673 Personal history of transient ischemic attack (TIA), and cerebral infarction without residual deficits: Secondary | ICD-10-CM

## 2023-09-02 DIAGNOSIS — R414 Neurologic neglect syndrome: Secondary | ICD-10-CM

## 2023-09-02 DIAGNOSIS — I10 Essential (primary) hypertension: Secondary | ICD-10-CM

## 2023-09-02 DIAGNOSIS — M6281 Muscle weakness (generalized): Secondary | ICD-10-CM

## 2023-09-02 DIAGNOSIS — R2689 Other abnormalities of gait and mobility: Secondary | ICD-10-CM

## 2023-09-02 DIAGNOSIS — I69354 Hemiplegia and hemiparesis following cerebral infarction affecting left non-dominant side: Secondary | ICD-10-CM

## 2023-09-02 DIAGNOSIS — R2681 Unsteadiness on feet: Secondary | ICD-10-CM

## 2023-09-02 DIAGNOSIS — Z91018 Allergy to other foods: Secondary | ICD-10-CM

## 2023-09-02 DIAGNOSIS — Z87898 Personal history of other specified conditions: Secondary | ICD-10-CM

## 2023-09-02 DIAGNOSIS — E785 Hyperlipidemia, unspecified: Secondary | ICD-10-CM

## 2023-09-02 MED ORDER — CARVEDILOL 25 MG PO TABS: ORAL_TABLET | ORAL | 3 refills | Status: DC

## 2023-09-02 MED ORDER — LISINOPRIL 10 MG PO TABS: 10.0000 mg | ORAL_TABLET | Freq: Every day | ORAL | 3 refills | Status: DC

## 2023-09-02 MED ORDER — ROSUVASTATIN CALCIUM 40 MG PO TABS: 40.0000 mg | ORAL_TABLET | Freq: Every day | ORAL | 3 refills | Status: AC

## 2023-09-02 MED ORDER — AMLODIPINE BESYLATE 10 MG PO TABS
10.0000 mg | ORAL_TABLET | Freq: Every day | ORAL | 0 refills | Status: DC
Start: 2023-09-02 — End: 2023-12-07

## 2023-09-02 NOTE — Assessment & Plan Note (Signed)
 LDL levels are not at goal for someone with a history of stroke. The target LDL is less than 50 mg/dL due to stroke history and familial cardiovascular disease. We discussed switching to rosuvastatin, which is more effective than atorvastatin, lowering cholesterol by about 20% more. Recent LDL measurements range from 66 to 82 mg/dL. Switch atorvastatin to rosuvastatin to achieve better LDL control. Monitor cholesterol levels and adjust treatment as needed.

## 2023-09-02 NOTE — Assessment & Plan Note (Signed)
 Peri-CVA no longer needs anticonvulsants, just can't have medications with seizure risks.

## 2023-09-02 NOTE — Assessment & Plan Note (Signed)
 Works with physical therapy, walks without assistance, still works job.

## 2023-09-02 NOTE — Patient Instructions (Addendum)
 It was a pleasure seeing you today! Your health and satisfaction are our top priorities.  Glenetta Hew, MD  Your Providers PCP: Lula Olszewski, MD,  [phone]) Referring Provider: Mliss Sax, MD,  [phone])  VISIT SUMMARY:  Today, we reviewed your medications and overall health following your stroke in June 2021. We discussed your current management for stroke, hypertension, high cholesterol, and type 2 diabetes. We also talked about lifestyle changes to improve your cardiovascular health.  YOUR PLAN:  -STROKE: You had a stroke in June 2021, which caused weakness on your left side. We will continue using Dysport for muscle spasticity and switch your cholesterol medication from atorvastatin to rosuvastatin to better manage your cholesterol and prevent another stroke. Please monitor for any muscle cramps as a side effect.  -HYPERTENSION: High blood pressure can increase your risk of stroke. Your current medications are lisinopril, carvedilol, and amlodipine. Please continue taking these and monitor your blood pressure daily at home.  -HYPERLIPIDEMIA: Your cholesterol levels are not at the target for someone with a history of stroke. We will switch your medication from atorvastatin to rosuvastatin to better control your cholesterol. Please monitor your cholesterol levels regularly.  -TYPE 2 DIABETES MELLITUS: Your diabetes is well-controlled with lifestyle changes and metformin. If you maintain a healthy diet, we may consider stopping metformin. We will reassess your A1c levels in 1-2 months.  -GENERAL HEALTH MAINTENANCE: To improve your cardiovascular health, continue with your physical activity and dietary modifications. Include extra virgin olive oil and fish in your diet and avoid fried foods unless cooked in extra virgin olive oil.  INSTRUCTIONS:  Please schedule a follow-up appointment in 1-2 months for blood work and reassessment of your health conditions. Ensure  you fast before the blood work, but you may have water and black coffee.   NEXT STEPS: [x]  Early Intervention: Schedule sooner appointment, call our on-call services, or go to emergency room if there is any significant Increase in pain or discomfort New or worsening symptoms Sudden or severe changes in your health [x]  Flexible Follow-Up: We recommend a No follow-ups on file. for optimal routine care. This allows for progress monitoring and treatment adjustments. [x]  Preventive Care: Schedule your annual preventive care visit! It's typically covered by insurance and helps identify potential health issues early. [x]  Lab & X-ray Appointments: Incomplete tests scheduled today, or call to schedule. X-rays: [facility] (M-F, 8:30am-noon or 1pm-5pm). [x]  Medical Information Release: Sign a release form at front desk to obtain relevant medical information we don't have.  MAKING THE MOST OF OUR FOCUSED 20 MINUTE APPOINTMENTS: [x]   Clearly state your top concerns at the beginning of the visit to focus our discussion [x]   If you anticipate you will need more time, please inform the front desk during scheduling - we can book multiple appointments in the same week. [x]   If you have transportation problems- use our convenient video appointments or ask about transportation support. [x]   We can get down to business faster if you use MyChart to update information before the visit and submit non-urgent questions before your visit. Thank you for taking the time to provide details through MyChart.  Let our nurse know and she can import this information into your encounter documents.  Arrival and Wait Times: [x]   Arriving on time ensures that everyone receives prompt attention. [x]   Early morning (8a) and afternoon (1p) appointments tend to have shortest wait times. [x]   Unfortunately, we cannot delay appointments for late arrivals  or hold slots during phone calls.  Getting Answers and Following  Up [x]   Simple Questions & Concerns: For quick questions or basic follow-up after your visit, reach [place] at [phone] or MyChart messaging. [x]   Complex Concerns: If your concern is more complex, scheduling an appointment might be best. Discuss this with the staff to find the most suitable option. [x]   Lab & Imaging Results: We'll contact you directly if results are abnormal or you don't use MyChart. Most normal results will be on MyChart within 2-3 business days, with a review message from Dr. Jon Billings. Haven't heard back in 2 weeks? Need results sooner? Contact us at [phone]. [x]   Referrals: Our referral coordinator will manage specialist referrals. The specialist's office should contact you within 2 weeks to schedule an appointment. Call us if you haven't heard from them after 2 weeks.  Staying Connected [x]   MyChart: Activate your MyChart for the fastest way to access results and message [place]. See the last page of this paperwork for instructions on how to activate.  Bring to Your Next Appointment [x]   Medications: Please bring all your medication bottles to your next appointment to ensure we have an accurate record of your prescriptions. [x]   Health Diaries: If you're monitoring any health conditions at home, keeping a diary of your readings can be very helpful for discussions at your next appointment.  Billing [x]   X-ray & Lab Orders: These are billed by separate companies. Contact the invoicing company directly for questions or concerns. [x]   Visit Charges: Discuss any billing inquiries with our administrative services team.  Your Satisfaction Matters [x]   Share Your Experience: We strive for your satisfaction! If you have any complaints, or preferably compliments, please let Dr. Jon Billings know directly or contact our Practice Administrators, Edwena Felty or [employer], by asking at the front desk.   Reviewing Your Records [x]   Review this early draft of your clinical encounter  notes below and the final encounter summary tomorrow on MyChart after its been completed.  All orders placed so far are visible here: There are no diagnoses linked to this encounter.

## 2023-09-02 NOTE — Assessment & Plan Note (Signed)
 Hypertension likely contributes to his stroke risk. Blood pressure readings have been variable, with some elevated measurements. Current medications include lisinopril, carvedilol, and amlodipine. He is advised to monitor blood pressure daily due to risk of fluctuations. Continue the current antihypertensive regimen and encourage daily blood pressure monitoring at home. Refill antihypertensive medications with 90-day supplies and 3 refills.

## 2023-09-02 NOTE — Therapy (Signed)
 OUTPATIENT PHYSICAL THERAPY NEURO TREATMENT   Patient Name: Dennis Mercado MRN: [id number] DOB:07-19-1980, 43 y.o., male Today's Date: 09/02/2023   PCP: Mliss Sax MD  REFERRING PROVIDER: Erick Colace, MD  END OF SESSION:  PT End of Session - 09/02/23 1357     Visit Number 4    Authorization Type [employer]    PT Start Time 1357    PT Stop Time 1443    PT Time Calculation (min) 46 min    Activity Tolerance Patient tolerated treatment well    Behavior During Therapy WFL for tasks assessed/performed             Past Medical History:  Diagnosis Date   Acute blood loss anemia    Acute respiratory failure (HCC)    Dysphagia, post-stroke    Grieving 12/20/2021   Hypertension    Hyponatremia    Lab Results      Component    Value    Date/Time           NA    138    07/08/2023 09:15 AM           NA    140    01/21/2023 09:31 AM           NA    139    07/09/2022 09:56 AM           NA    136    12/20/2021 11:20 AM           NA    137    06/27/2021 09:06 AM           NA    138    01/31/2021 11:00 AM           NA    140    10/31/2020 02:31 PM           Hypotension due to drugs    ICH (intracerebral hemorrhage) (HCC) 12/06/2019   Leukocytosis    Lab Results      Component    Value    Date/Time           WBC    5.9    07/09/2022 09:56 AM           WBC    7.2    06/27/2021 09:06 AM           WBC    6.6    10/05/2020 09:54 AM           WBC    7.9    03/28/2020 08:01 AM           WBC    6.8    01/10/2020 06:10 AM           Marijuana abuse    Normocytic anemia    Lab Results      Component    Value    Date/Time           HGB    14.3    07/09/2022 09:56 AM           HGB    13.5    06/27/2021 09:06 AM           HGB    13.2    10/05/2020 09:54 AM           HGB    13.0    03/28/2020 08:01 AM           HGB    12.1 (L)  01/10/2020 06:10 AM           Prediabetes 06/27/2021   Prerenal azotemia    Lab Results      Component    Value    Date           BUN    14    07/08/2023            BUN    12    01/21/2023           BUN    13    07/09/2022           BUN    11    12/20/2021           BUN    21    06/27/2021           Screening for prostate cancer 12/20/2021   Sleep disturbance    Transaminitis    Lab Results      Component    Value    Date           ALT    18    07/09/2022           AST    15    07/09/2022           ALKPHOS    52    06/27/2021           BILITOT    0.7    07/09/2022           Vascular headache    Past Surgical History:  Procedure Laterality Date   WISDOM TOOTH EXTRACTION     Patient Active Problem List   Diagnosis Date Noted   History of seizure 09/02/2023   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 09/02/2023   Ataxia 09/02/2023   Mixed hyperlipidemia 10/05/2020   History of CVA (cerebrovascular accident) 01/19/2020   Essential hypertension    Left hemiparesis (HCC)     ONSET DATE: chronic   REFERRING DIAG:  Diagnosis  G81.14 (ICD-10-CM) - Left spastic hemiplegia (HCC)    THERAPY DIAG:  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)  Muscle weakness (generalized)  Hemiplegia and hemiparesis following cerebral infarction affecting left non-dominant side (HCC)  Unsteadiness on feet  Other abnormalities of gait and mobility  Neurologic neglect syndrome  Rationale for Evaluation and Treatment: Rehabilitation  SUBJECTIVE:                                                                                                                                                                                             SUBJECTIVE  STATEMENT: I am doing okay, reports no falls  The original reason I'm coming is for the shoulder, wanted to try to some dry needling. I've been to a lot of [facility] clinics, wanted to see what PT here had to offer while I'm here. Curbs and higher inclines are tough. I'm in the gym, also try to walk at least 30 min at speed 1.4 on the TM, doing inclines on TM at the gym too.   Pt accompanied by:  self  PERTINENT HISTORY: see above   PAIN:  Are you having pain? No  PRECAUTIONS: None  RED FLAGS: None   WEIGHT BEARING RESTRICTIONS: No  FALLS: Has patient fallen in last 6 months? No  LIVING ENVIRONMENT: Lives with: lives with their spouse Lives in: House/apartment Stairs: 1 STE, one level home Has following equipment at home: Single point cane  PLOF: Independent, Independent with basic ADLs, Independent with gait, and Independent with transfers  PATIENT GOALS: tune up on high level strength and balance, see what PT here has to offer   OBJECTIVE:  Note: Objective measures were completed at Evaluation unless otherwise noted.    COGNITION: Overall cognitive status: Within functional limits for tasks assessed   SENSATION: Not tested          LOWER EXTREMITY MMT:    MMT Right Eval Left Eval  Hip flexion  3  Hip extension    Hip abduction  3  Hip adduction    Hip internal rotation    Hip external rotation    Knee flexion  4-  Knee extension  4-  Ankle dorsiflexion  4-  Ankle plantarflexion    Ankle inversion    Ankle eversion    (Blank rows = not tested)      STAIRS: Level of Assistance: Min A Stair Negotiation Technique: Step to Pattern with No Rails Number of Stairs: 4  Height of Stairs: 4 inch   Comments: Min A for balance due to anterior balance loss, difficulty clearing steps   GAIT: Gait pattern: step to pattern, decreased arm swing- Left, decreased step length- Right, decreased stride length, circumduction- Left, Left hip hike, lateral lean- Right, and decreased trunk rotation Distance walked: in clinic distances  Assistive device utilized: None Level of assistance: Complete Independence Comments: chronic CVA related impairments, ongoing spasticity and tone   FUNCTIONAL TESTS:  Timed up and go (TUG): 16.5 seconds no device  Dynamic Gait Index: 11/26  PATIENT SURVEYS:  ABC scale 87.5%                                                                                                                               TREATMENT DATE:  09/02/23 Nustep level 6 x 6 minutes LEg press 30# x10, left only 2x10 20# Leg curls 15# left only 2x10 Leg extension 5# 2x10 On airex left arm 5# row and extension CGA On airex volleyball  Rockerboard 2 ways Tmill push, with assist Passive  stretch left shoulder Education on DN  08/19/23 NuStep L4 x 6 min CHECKED GOALS  Outdoor ambulation up and down curbs, through front [address] S2S LE on airex 2x10 Sides step on and off airex  Shoulder Ext green 2x10 AAROM Flex dowel  08/05/23 NuStep L 5 x 6 min S2S LE on airex 2x10 AAROM Shoulder Flex standing on airex x10 Sides step on and off airex  Alt 4in box taps  20lb resisted forward walking  07/22/23  Eval, care planning, discussion of goals of care,encouraged ongoing exercise at gym, discussed stretching appropriate for spasticity management, HR max vs RPE given medicines he's currently taking, PT focus   Nustep L7 x8 minutes during above education     PATIENT EDUCATION: Education details: see above  Person educated: Patient Education method: Programmer, multimedia, Demonstration, and Handouts Education comprehension: verbalized understanding, returned demonstration, and needs further education  HOME EXERCISE PROGRAM: Already on great program, will update PRN   GOALS: Goals reviewed with patient? Yes  SHORT TERM GOALS: Target date: 08/19/2023    Will be compliant with appropriate progressive HEP  Baseline: Goal status: Met 08/19/23 goes to the gym  2.  Will score at least 16 on DGI  Baseline:  Goal status: INITIAL  3.  Will complete TUG in 12 seconds or less  Baseline:  Goal status: 08/19/23 Met 11:91   4.  Will be able to navigate steps and curbs on Mod(I) basis with minimal unsteadiness, no more than distant S  Baseline:  Goal status: Progressing 09/02/23 SBA to CGA  5.  Will be able to navigate incline in the back of  the clinic (ascent and descent) with Mod(I) minimal unsteadiness  Baseline:  Goal status: Progressing 08/19/23  6.  Will score 100% on ABC to show subjective improvement  Baseline:  Goal status: 89.4% 08/19/23  7. Will be able use to use spastic L UE on machinery such as Nustep without difficulty  Goal status: Met 08/19/23 uses the machine at the gym   LONG TERM GOALS: Target date: LTG = STG      ASSESSMENT:  CLINICAL IMPRESSION: Patient is a 43 y.o. M who was seen today for physical therapy evaluation and treatment for G81.14 (ICD-10-CM) - Left spastic hemiplegia (HCC). He is really very functional given having significant ongoing chronic L hemiplegia. Patient has some fear of some of the exercises, machines and the dynamic surfaces, he was hesitant at first but would try the exercises, tended to want to hold on with the balance ex's at first Pt would really benefit from a tune up for high level balance and coordination with skilled PT services. Extremely motivated to continue improving and maintain functional status.   OBJECTIVE IMPAIRMENTS: Abnormal gait, decreased balance, decreased mobility, difficulty walking, decreased strength, and impaired tone.   ACTIVITY LIMITATIONS: locomotion level  PARTICIPATION LIMITATIONS: driving, shopping, and community activity  PERSONAL FACTORS: Age and Time since onset of injury/illness/exacerbation are also affecting patient's functional outcome.   REHAB POTENTIAL: Excellent  CLINICAL DECISION MAKING: Stable/uncomplicated  EVALUATION COMPLEXITY: Low  PLAN:  PT FREQUENCY: 2x/week  PT DURATION: 4 weeks  PLANNED INTERVENTIONS: 97164- PT Re-evaluation, 97110-Therapeutic exercises, 97530- Therapeutic activity, [id number]- Neuromuscular re-education, 97535- Self Care, [id number]- Manual therapy, [id number]- Gait training, and [phone]- Aquatic Therapy  PLAN FOR NEXT SESSION: try elliptical, focus on high level balance/coordination, curbs/hills. Already doing a lot  of resistance training at the gym. Use RPE scale instead of HR monitoring. Requesting dry needling to his shoulder (OT not  able to do this)  Stacie Glaze, PT 09/02/23 1:57 PM

## 2023-09-02 NOTE — Progress Notes (Signed)
 [facility]  Phone: [phone]  - Medical Office Visit -  Visit Date: 09/02/2023 Patient: Dennis Mercado   DOB: 01-May-1981   43 y.o. Male  MRN: [id number] Patient Care Team: Lula Olszewski, MD as PCP - General (Internal Medicine) Today's Health Care Provider: Lula Olszewski, MD  ===========================================  Chief Complaint / Reason for Visit: Transfer of care Was having very long drive to previous Primary Care Provider (PCP).   Subjective  43 y.o. 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  HPI Discussed the use of AI scribe software for clinical note transcription with the patient, who gave verbal consent to proceed.    History of Present Illness Dennis Mercado is a 43 year old male with a history of stroke who presents for medication review and management.  He experienced a stroke in June 2021, resulting in left-sided hemiparesis. The stroke was ischemic with initial bleeding that resolved without surgical intervention. He was wheelchair-bound for six to seven months post-stroke and has since returned to work full-time. He experiences spasticity in his left arm and hand, for which he receives Dysport injections. No headaches were noted at the time of the stroke, and he was unaware of any symptoms prior to the event.  He has a history of hypertension, which was noted to be high prior to his stroke. He is currently on lisinopril and carvedilol for blood pressure management. His blood pressure readings at home are generally stable, with occasional elevations. He monitors his blood pressure regularly, especially after workouts.  He was previously diagnosed with diabetes, which he managed with lifestyle changes. He is currently on metformin 500 mg at bedtime. No current symptoms of  hyperglycemia are reported.  He takes atorvastatin for cholesterol management following his stroke. His LDL levels have been recorded as 76, 71, and 66 in recent tests, with a calculated measurement of 82 two months ago. He aims to maintain a healthy diet, including reducing junk food intake and increasing physical activity.  Socially, he has a history of marijuana use but has not used it in years. He consumes alcohol occasionally and does not use tobacco. He works full-time in Systems [employer] and engages in regular physical activity, including gym workouts and treadmill use at home.   Problem list overviews that were updated at today's visit:  resolved multiple problems from his list. Problem  History of Seizure   Few seizures at time of 2021 CVA Went away and anticonvulsants stopped by neurologist   Allergy to Avocado   Gets nausea with avocado ever since CVA.   Ataxia  Mixed Hyperlipidemia   Goal needs to be very low due to strong family history cardiovascular disease AND had CVA in 30s.  Lab Results  Component Value Date   HDL 41.10 07/08/2023   HDL 39 (L) 07/09/2022   HDL 37.50 (L) 10/05/2020   CHOLHDL 3 07/08/2023   CHOLHDL 2.8 07/09/2022   CHOLHDL 3 10/05/2020   Lab Results  Component Value Date   LDLCALC 82 07/08/2023   LDLCALC 58 07/09/2022   LDLCALC 58 10/05/2020   LDLDIRECT 76.0 01/21/2023   LDLDIRECT 71.0 06/27/2021   LDLDIRECT 66.0 10/05/2020   Lab Results  Component Value Date   TRIG 58.0 07/08/2023   TRIG 58 07/09/2022   TRIG 55.0 10/05/2020   Lab Results  Component Value Date   CHOL 135 07/08/2023  CHOL 110 07/09/2022   CHOL 106 10/05/2020   The 10-year ASCVD risk score (Arnett DK, et al., 2019) is: 8.8%   Values used to calculate the score:     Age: 45 years     Sex: Male     Is Non-Hispanic African American: Yes     Diabetic: Yes     Tobacco smoker: No     Systolic Blood Pressure: 118 mmHg     Is BP treated: Yes     HDL Cholesterol: 41.1  mg/dL     Total Cholesterol: 135 mg/dL Lab Results  Component Value Date   ALT 18 07/09/2022   AST 15 07/09/2022   ALKPHOS 52 06/27/2021   HGBA1C 6.3 07/08/2023   Body mass index is 25.75 kg/m.  Lipoprotein(a), Apolipoprotein B (ApoB), and High-sensitivity C-reactive protein (hs-CRP) No results found for: "HSCRP", "LIPOA" Improving Your Cholesterol: Diet: Focus on a Mediterranean-style diet, limit saturated fats and sugars, and increase omega-3 fatty acids (fish, flaxseeds,nuts,extra virgin olive oil, avocados). Exercise: Engage in regular physical activity (aerobic exercises are particularly beneficial for HDL). Weight Management: Maintain a healthy weight. Smoking Cessation: Quitting smoking improves cholesterol levels.    Essential Hypertension   Checks daily due to high blood pressure at time of early CVA  Current hypertension medications:       Sig   amLODipine (NORVASC) 10 MG tablet (Taking) TAKE 1 TABLET(10 MG) BY MOUTH DAILY   carvedilol (COREG) 25 MG tablet (Taking) TAKE 1 TABLET(25 MG) BY MOUTH TWICE DAILY WITH A MEAL   lisinopril (ZESTRIL) 10 MG tablet (Taking) TAKE 1 TABLET(10 MG) BY MOUTH DAILY   tadalafil (CIALIS) 5 MG tablet (Taking As Needed) Take 5 mg by mouth daily as needed.      Lab Results  Component Value Date   NA 138 07/08/2023   K 4.2 07/08/2023   CREATININE 1.16 07/08/2023   GFR 77.72 07/08/2023      Screening for Prostate Cancer (Resolved)  Grieving (Resolved)  Prediabetes (Resolved)  Normocytic Anemia (Resolved)   Lab Results  Component Value Date/Time   HGB 14.3 07/09/2022 09:56 AM   HGB 13.5 06/27/2021 09:06 AM   HGB 13.2 10/05/2020 09:54 AM   HGB 13.0 03/28/2020 08:01 AM   HGB 12.1 (L) 01/10/2020 06:10 AM      Hyponatremia (Resolved)   Lab Results  Component Value Date/Time   NA 138 07/08/2023 09:15 AM   NA 140 01/21/2023 09:31 AM   NA 139 07/09/2022 09:56 AM   NA 136 12/20/2021 11:20 AM   NA 137 06/27/2021 09:06 AM   NA  138 01/31/2021 11:00 AM   NA 140 10/31/2020 02:31 PM      Acute Blood Loss Anemia (Resolved)  Hypotension Due to Drugs (Resolved)  Vascular Headache (Resolved)  Prerenal Azotemia (Resolved)   Lab Results  Component Value Date   BUN 14 07/08/2023   BUN 12 01/21/2023   BUN 13 07/09/2022   BUN 11 12/20/2021   BUN 21 06/27/2021      Sleep Disturbance (Resolved)  Leukocytosis (Resolved)   Lab Results  Component Value Date/Time   WBC 5.9 07/09/2022 09:56 AM   WBC 7.2 06/27/2021 09:06 AM   WBC 6.6 10/05/2020 09:54 AM   WBC 7.9 03/28/2020 08:01 AM   WBC 6.8 01/10/2020 06:10 AM      Transaminitis (Resolved)   Lab Results  Component Value Date   ALT 18 07/09/2022   AST 15 07/09/2022   ALKPHOS 52 06/27/2021  BILITOT 0.7 07/09/2022      Marijuana Abuse (Resolved)  Dysphagia, Post-Stroke (Resolved)  Acute Respiratory Failure (Hcc) (Resolved)  Ich (Intracerebral Hemorrhage) (Hcc) (Resolved)    Medications reviewed and modified: Current Outpatient Medications on File Prior to Visit  Medication Sig   DYSPORT 500 units SOLR injection INJECT LEFT BICEPS 500 UNITS, FPL 100 UNITS, FCR 300 UNITS, PALMARIS LONGUS 100 UNITS, FDS 200 UNITS, FDP 300 UNITS. AS DIRECTED TO BE ADMINISTERED IN PRESCRIBERS OFFICE EVERY 3 MONTHS AS DIRECTED   metFORMIN (GLUCOPHAGE-XR) 500 MG 24 hr tablet TAKE 1 TABLET BY MOUTH DAILY WITH BREAKFAST (Patient taking differently: Take 500 mg by mouth at bedtime.)   tadalafil (CIALIS) 5 MG tablet Take 5 mg by mouth daily as needed.   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 Current Meds  Medication Sig   DYSPORT 500 units SOLR injection INJECT LEFT BICEPS 500 UNITS, FPL 100 UNITS, FCR 300 UNITS, PALMARIS LONGUS 100 UNITS, FDS 200 UNITS, FDP 300 UNITS. AS DIRECTED TO BE ADMINISTERED IN PRESCRIBERS OFFICE EVERY 3 MONTHS AS DIRECTED   metFORMIN (GLUCOPHAGE-XR) 500 MG 24 hr tablet TAKE 1 TABLET BY MOUTH DAILY WITH BREAKFAST (Patient taking differently: Take 500 mg by mouth at bedtime.)   rosuvastatin (CRESTOR) 40 MG tablet Take 1 tablet (40 mg total) by mouth daily. Replaces atorvastatin   tadalafil (CIALIS) 5 MG tablet Take 5 mg by mouth daily as needed.   [DISCONTINUED] amLODipine (NORVASC) 10 MG tablet TAKE 1 TABLET(10 MG) BY MOUTH DAILY   [DISCONTINUED] atorvastatin (LIPITOR) 40 MG tablet Take 1 tablet (40 mg total) by mouth daily.   [DISCONTINUED] carvedilol (COREG) 25 MG tablet TAKE 1 TABLET(25 MG) BY MOUTH TWICE DAILY WITH A MEAL   [DISCONTINUED] lisinopril (ZESTRIL) 10 MG tablet TAKE 1 TABLET(10 MG) BY MOUTH DAILY   Current Facility-Administered Medications for the 09/02/23 encounter (Office Visit) with Lula Olszewski, MD  Medication   AbobotulinumtoxinA (DYSPORT) 500 units injection 1,500 Units   sodium chloride (PF) 0.9 % injection 7.5 mL   Allergies:  No Known Allergies Past Medical History:  has a past medical history of Acute blood loss anemia, Acute respiratory failure (HCC), Dysphagia, post-stroke, Grieving (12/20/2021), Hypertension, Hyponatremia, Hypotension due to drugs, ICH (intracerebral hemorrhage) (HCC) (12/06/2019), Leukocytosis, Marijuana abuse, Normocytic anemia, Prediabetes (06/27/2021), Prerenal azotemia, Screening for prostate cancer (12/20/2021), Sleep disturbance, Transaminitis, and Vascular headache. Past Surgical  History:   has a past surgical history that includes Wisdom tooth extraction. Social History:   reports that he has never smoked. He has never used smokeless tobacco. He reports current alcohol use. He reports that he does not currently use drugs after having used the following drugs: Marijuana. Family History:  family history includes Diabetes in his maternal grandmother. Depression Screen and Health Maintenance:    09/02/2023   10:03 AM 07/08/2023    8:44 AM 05/27/2023   12:39 PM 12/12/2022    8:52 AM  PHQ 2/9 Scores  PHQ - 2 Score 0 0 0 0   Health Maintenance  Topic Date Due   Pneumococcal Vaccine 4-95 Years old (1 of 2 - PCV) 07/07/2024 (Originally 01/28/1987)   Hepatitis C Screening  09/01/2024 (Originally 01/28/1999)   FOOT EXAM  10/01/2023   OPHTHALMOLOGY EXAM  11/16/2023   HEMOGLOBIN A1C  01/05/2024   Diabetic kidney evaluation - eGFR measurement  07/07/2024   Diabetic kidney evaluation - Urine ACR  07/07/2024   HIV Screening  Completed   HPV VACCINES  Aged Out   DTaP/Tdap/Td  Discontinued   INFLUENZA VACCINE  Discontinued   COVID-19 Vaccine  Discontinued   Immunization History  Administered Date(s) Administered   Influenza-Unspecified 05/22/2019     Objective   Physical ExamBP 118/80   Pulse 88   Temp 98.2 F (36.8 C) (Temporal)   Ht 5\' 7"  (1.702 m)   Wt 164 lb 6.4 oz (74.6 kg)   SpO2 98%   BMI 25.75 kg/m  Wt Readings from Last 10 Encounters:  09/02/23 164 lb 6.4 oz (74.6 kg)  07/08/23 164 lb 6.4 oz (74.6 kg)  05/27/23 164 lb (74.4 kg)  05/27/23 164 lb (74.4 kg)  01/21/23 158 lb (71.7 kg)  01/21/23 157 lb 9.6 oz (71.5 kg)  12/12/22 157 lb 9.6 oz (71.5 kg)  10/08/22 157 lb 12.8 oz (71.6 kg)  09/17/22 159 lb 9.6 oz (72.4 kg)  06/27/22 160 lb 3.2 oz (72.7 kg)  Vital signs reviewed.  Nursing notes reviewed. Weight trend reviewed. Abnormalities and problem-specific physical exam findings:  very wide irregular gait and marked/obvious hemparesis. Voice clear not  slurred, memory pretty good. General Appearance:  Well developed, well nourished, well-groomed, healthy-appearing male with Body mass index is 25.75 kg/m. No acute distress appreciable.   Skin: Clear and well-hydrated. Pulmonary:  Normal work of breathing at rest, no respiratory distress apparent. SpO2: 98 %  Musculoskeletal: He demonstrates smooth and coordinated movements throughout all major joints.All extremities are intact.  Neurological:  Awake, alert, oriented, and engaged.  No obvious cognitive impairments.  Sensorium seems unclouded.  Psychiatric:  Appropriate mood, pleasant and cooperative demeanor, cheerful and engaged during the exam  Reviewed Results & Data Results LABS LDL: 76 (06/2023) LDL: 71 (05/2023) LDL: 66 (04/2023) LDL: 82 (06/2023)  PATHOLOGY Intracerebral hemorrhage (11/2019)    No results found for any visits on 09/02/23.  Office Visit on 07/08/2023  Component Date Value   Sodium 07/08/2023 138    Potassium 07/08/2023 4.2    Chloride 07/08/2023 100    CO2 07/08/2023 31    Glucose, Bld 07/08/2023 87    BUN 07/08/2023 14    Creatinine, Ser 07/08/2023 1.16    GFR 07/08/2023 77.72    Calcium 07/08/2023 9.8    Cholesterol 07/08/2023 135    Triglycerides 07/08/2023 58.0    HDL 07/08/2023 41.10    VLDL 07/08/2023 11.6    LDL Cholesterol 07/08/2023 82    Total CHOL/HDL Ratio 07/08/2023 3    NonHDL 07/08/2023 93.42    Hgb A1c MFr Bld 07/08/2023 6.3    Microalb, Ur 07/08/2023 <0.7    Creatinine,U 07/08/2023 40.8    Microalb Creat Ratio 07/08/2023 1.7    Color, Urine 07/08/2023 YELLOW    APPearance 07/08/2023 CLEAR    Specific Gravity, Urine 07/08/2023 1.010    pH 07/08/2023 6.5    Total Protein, Urine 07/08/2023 NEGATIVE    Urine Glucose 07/08/2023 NEGATIVE    Ketones, ur 07/08/2023 NEGATIVE    Bilirubin Urine 07/08/2023 NEGATIVE  Hgb urine dipstick 07/08/2023 NEGATIVE    Urobilinogen, UA 07/08/2023 0.2    Leukocytes,Ua 07/08/2023 NEGATIVE     Nitrite 07/08/2023 NEGATIVE    WBC, UA 07/08/2023 none seen    RBC / HPF 07/08/2023 none seen   Office Visit on 01/21/2023  Component Date Value   Sodium 01/21/2023 140    Potassium 01/21/2023 4.5    Chloride 01/21/2023 101    CO2 01/21/2023 28    Glucose, Bld 01/21/2023 85    BUN 01/21/2023 12    Creatinine, Ser 01/21/2023 1.14    GFR 01/21/2023 79.62    Calcium 01/21/2023 9.8    Direct LDL 01/21/2023 76.0    Hgb A1c MFr Bld 01/21/2023 6.0   Office Visit on 10/01/2022  Component Date Value   Hemoglobin A1C 10/01/2022 6.7 (A)    No image results found.   No results found.  DG Chest 2 View Result Date: 12/16/2019 CLINICAL DATA:  Fever EXAM: CHEST - 2 VIEW COMPARISON:  12/11/2019 FINDINGS: The heart size and mediastinal contours are within normal limits. Both lungs are clear. The visualized skeletal structures are unremarkable. IMPRESSION: No active cardiopulmonary disease. Electronically Signed   By: Charlett Nose M.D.   On: 12/16/2019 16:19            Assessment & Plan Type 2 diabetes mellitus without complication, without long-term current use of insulin (HCC) His type 2 diabetes is currently well-controlled with lifestyle modifications and metformin. A1c levels have improved, and he is considering discontinuing metformin. We discussed the possibility of stopping metformin if a healthy diet is maintained, with no sugar in the house. Consider discontinuing metformin if he maintains a healthy diet and lifestyle. Plan for follow-up blood work in 1-2 months to reassess A1c levels. Mixed hyperlipidemia LDL levels are not at goal for someone with a history of stroke. The target LDL is less than 50 mg/dL due to stroke history and familial cardiovascular disease. We discussed switching to rosuvastatin, which is more effective than atorvastatin, lowering cholesterol by about 20% more. Recent LDL measurements range from 66 to 82 mg/dL. Switch atorvastatin to rosuvastatin to achieve better LDL  control. Monitor cholesterol levels and adjust treatment as needed. History of seizure Peri-CVA no longer needs anticonvulsants, just can't have medications with seizure risks. Essential hypertension Hypertension likely contributes to his stroke risk. Blood pressure readings have been variable, with some elevated measurements. Current medications include lisinopril, carvedilol, and amlodipine. He is advised to monitor blood pressure daily due to risk of fluctuations. Continue the current antihypertensive regimen and encourage daily blood pressure monitoring at home. Refill antihypertensive medications with 90-day supplies and 3 refills. Ataxia Works with physical therapy, walks without assistance, still works job. 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 No clear etiology was identified, but there may be a familial genetic predisposition to cardiovascular disease. Current management includes Dysport for spasticity and Lipitor for stroke prevention. We discussed switching to rosuvastatin for better cholesterol management and stroke prevention, as it lowers cholesterol by about 20% more than atorvastatin. Potential side effects include muscle cramps. Continue Dysport for spasticity management and switch atorvastatin to rosuvastatin. Monitor for side effects such as muscle cramps. Encourage lifestyle modifications, including diet and exercise, to reduce cardiovascular risk.  General Health Maintenance   We discussed lifestyle modifications to improve cardiovascular health, including diet and exercise. He  is encouraged to consume extra virgin olive oil and fish for cardiovascular benefits and advised to avoid fried foods unless cooked in extra virgin olive oil. Encourage consumption of extra virgin olive oil and fish for cardiovascular health.  Continue physical activity and dietary modifications to maintain cardiovascular health.  Follow-up   Follow-up plans were discussed to monitor health conditions and medication efficacy. He is advised to schedule a follow-up appointment in 1-2 months for blood work and reassessment of health conditions. Ensure fasting before blood work, with allowance for water and black coffee.  Diagnoses and all orders for this visit: Type 2 diabetes mellitus without complication, without long-term current use of insulin (HCC) Hyperlipidemia, unspecified hyperlipidemia type -     rosuvastatin (CRESTOR) 40 MG tablet; Take 1 tablet (40 mg total) by mouth daily. Replaces atorvastatin Mixed hyperlipidemia History of seizure Essential hypertension -     amLODipine (NORVASC) 10 MG tablet; Take 1 tablet (10 mg total) by mouth daily. 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 -     lisinopril (ZESTRIL) 10 MG tablet; Take 1 tablet (10 mg total) by mouth daily. -     carvedilol (COREG) 25 MG tablet; TAKE 1 TABLET(25 MG) BY MOUTH TWICE DAILY WITH A MEAL History of CVA (cerebrovascular accident)  Future Appointments  Date Time Provider Department Center  09/16/2023  8:00 AM Payseur, Marzella Schlein, PTA [facility]  09/30/2023  9:45 AM Kirsteins, Victorino Sparrow, MD [facility]  10/28/2023  8:00 AM Colonel Bald, OT [facility]  10/28/2023  8:45 AM Teresa Pelton, PTA [facility]  11/11/2023  8:00 AM Colonel Bald, OT [facility]  11/11/2023  8:45 AM Teresa Pelton, PTA [facility]  11/12/2023  8:40 AM Lula Olszewski, MD [facility]       Additional notes: This document was synthesized by artificial intelligence ([employer]) using HIPAA-compliant recording of the clinical interaction;   We discussed the use of AI scribe software for clinical note transcription with the patient, who gave verbal consent to  proceed.    Additional Info: This encounter employed state-of-the-art, real-time, collaborative documentation. The patient actively reviewed and assisted in updating their electronic medical record on a shared screen, ensuring transparency and facilitating joint problem-solving for the problem list, overview, and plan. This approach promotes accurate, informed care. The treatment plan was discussed and reviewed in detail, including medication safety, potential side effects, and all patient questions. We confirmed understanding and comfort with the plan. Follow-up instructions were established, including contacting the office for any concerns, returning if symptoms worsen, persist, or new symptoms develop, and precautions for potential emergency department visits.  Initial Appointment Goals:  This initial visit focused on establishing a foundation for the patient's care. We collaboratively reviewed his medical history and medications in detail, updating the chart as shown in the encounter. Given the extensive information, we prioritized addressing his most pressing concerns, which he reported were: Transfer of care  While the complexity of the patient's medical picture may necessitate further evaluation in subsequent visits, we were able to develop a preliminary care plan together. To expedite a comprehensive plan at the next visit, we encouraged the patient to gather relevant medical records from previous providers. This collaborative approach will ensure a more complete understanding of the patient's health and inform the development of a personalized care plan. We look forward to continuing the  conversation and working together with the patient on achieving his health goals.   Collaborative Documentation:  Today's encounter utilized real-time, dynamic patient engagement.  Patients actively participate by directly reviewing and assisting in updating their medical records through a shared screen. This  transparency empowers patients to visually confirm chart updates made by the healthcare provider.  This collaborative approach facilitates problem management as we jointly update the problem list, problem overview, and assessment/plan. Ultimately, this process enhances chart accuracy and completeness, fostering shared decision-making, patient education, and informed consent for tests and treatments.  Collaborative Treatment Planning:  Treatment plans were discussed and reviewed in detail.  Explained medication safety and potential side effects.  Encouraged participation and answered all patient questions, confirming understanding and comfort with the plan. Encouraged patient to contact our office if they have any questions or concerns. Agreed on patient returning to office if symptoms worsen,  persist, or new symptoms develop.    I have personally spent 51 minutes involved in face-to-face and non-face-to-face activities for this patient on the day of the visit. Professional time spent includes the following activities: Preparing to see the patient (review of tests), Obtaining and/or reviewing separately obtained history (admission/discharge record), Performing a medically appropriate examination and/or evaluation , Ordering medications/tests/procedures, referring and communicating with other health care professionals, Documenting clinical information in the EMR, Independently interpreting results (not separately reported), Communicating results to the patient/family/caregiver, Counseling and educating the patient/family/caregiver and Care coordination (not separately reported).  Most of this time was spent reviewing his complex history and cleaning up his problem list and counseling on avoiding repeat stroke.      ----------------------------------------------------- Lula Olszewski, MD  09/02/2023 7:30 PM  [facility]:  [phone]

## 2023-09-02 NOTE — Assessment & Plan Note (Signed)
 Not true allergy but he can not tolerate since cva

## 2023-09-02 NOTE — Assessment & Plan Note (Signed)
 He experienced an ischemic stroke in June 2021, resulting in left hemiparesis and spasticity. No clear etiology was identified, but there may be a familial genetic predisposition to cardiovascular disease. Current management includes Dysport for spasticity and Lipitor for stroke prevention. We discussed switching to rosuvastatin for better cholesterol management and stroke prevention, as it lowers cholesterol by about 20% more than atorvastatin. Potential side effects include muscle cramps. Continue Dysport for spasticity management and switch atorvastatin to rosuvastatin. Monitor for side effects such as muscle cramps. Encourage lifestyle modifications, including diet and exercise, to reduce cardiovascular risk.

## 2023-09-16 ENCOUNTER — Encounter: Payer: Self-pay | Admitting: Physical Therapy

## 2023-09-16 ENCOUNTER — Ambulatory Visit: Attending: Physical Medicine & Rehabilitation | Admitting: Physical Therapy

## 2023-09-16 DIAGNOSIS — R2681 Unsteadiness on feet: Secondary | ICD-10-CM | POA: Insufficient documentation

## 2023-09-16 DIAGNOSIS — I69354 Hemiplegia and hemiparesis following cerebral infarction affecting left non-dominant side: Secondary | ICD-10-CM | POA: Insufficient documentation

## 2023-09-16 DIAGNOSIS — M6281 Muscle weakness (generalized): Secondary | ICD-10-CM | POA: Diagnosis present

## 2023-09-16 NOTE — Therapy (Signed)
 OUTPATIENT PHYSICAL THERAPY NEURO TREATMENT   Patient Name: Dennis Mercado MRN: [id number] DOB:July 11, 1980, 43 y.o., male Today's Date: 09/16/2023   PCP: Mliss Sax MD  REFERRING PROVIDER: Erick Colace, MD  END OF SESSION:  PT End of Session - 09/16/23 0806     Visit Number 5    Number of Visits 9    Date for PT Re-Evaluation 11/16/23    Authorization Type [employer]    PT Start Time 0804    PT Stop Time 0844    PT Time Calculation (min) 40 min    Activity Tolerance Patient tolerated treatment well    Behavior During Therapy [facility] for tasks assessed/performed             Past Medical History:  Diagnosis Date   Acute blood loss anemia    Acute respiratory failure (HCC)    Dysphagia, post-stroke    Grieving 12/20/2021   Hypertension    Hyponatremia    Lab Results      Component    Value    Date/Time           NA    138    07/08/2023 09:15 AM           NA    140    01/21/2023 09:31 AM           NA    139    07/09/2022 09:56 AM           NA    136    12/20/2021 11:20 AM           NA    137    06/27/2021 09:06 AM           NA    138    01/31/2021 11:00 AM           NA    140    10/31/2020 02:31 PM           Hypotension due to drugs    ICH (intracerebral hemorrhage) (HCC) 12/06/2019   Leukocytosis    Lab Results      Component    Value    Date/Time           WBC    5.9    07/09/2022 09:56 AM           WBC    7.2    06/27/2021 09:06 AM           WBC    6.6    10/05/2020 09:54 AM           WBC    7.9    03/28/2020 08:01 AM           WBC    6.8    01/10/2020 06:10 AM           Marijuana abuse    Normocytic anemia    Lab Results      Component    Value    Date/Time           HGB    14.3    07/09/2022 09:56 AM           HGB    13.5    06/27/2021 09:06 AM           HGB    13.2    10/05/2020 09:54 AM           HGB    13.0    03/28/2020 08:01 AM  HGB    12.1 (L)    01/10/2020 06:10 AM           Prediabetes 06/27/2021   Prerenal azotemia    Lab Results       Component    Value    Date           BUN    14    07/08/2023           BUN    12    01/21/2023           BUN    13    07/09/2022           BUN    11    12/20/2021           BUN    21    06/27/2021           Screening for prostate cancer 12/20/2021   Sleep disturbance    Transaminitis    Lab Results      Component    Value    Date           ALT    18    07/09/2022           AST    15    07/09/2022           ALKPHOS    52    06/27/2021           BILITOT    0.7    07/09/2022           Vascular headache    Past Surgical History:  Procedure Laterality Date   WISDOM TOOTH EXTRACTION     Patient Active Problem List   Diagnosis Date Noted   History of seizure 09/02/2023   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 09/02/2023   Ataxia 09/02/2023   Mixed hyperlipidemia 10/05/2020   History of CVA (cerebrovascular accident) 01/19/2020   Essential hypertension    Left hemiparesis (HCC)     ONSET DATE: chronic   REFERRING DIAG:  Diagnosis  G81.14 (ICD-10-CM) - Left spastic hemiplegia (HCC)    THERAPY DIAG:  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 I've been to a lot of [facility] clinics, wanted to see what PT here had to offer while I'm here. Curbs and higher inclines are tough. I'm in the gym, also try to walk at least 30 min at speed 1.4 on the TM, doing inclines on TM at the gym too.   Pt accompanied by:  self  PERTINENT HISTORY: see above   PAIN:  Are you having pain? No  PRECAUTIONS: None  RED FLAGS: None   WEIGHT BEARING RESTRICTIONS: No  FALLS: Has patient fallen in last 6 months? No  LIVING ENVIRONMENT: Lives with: lives with their spouse Lives in: House/apartment Stairs: 1 STE, one level home Has following equipment at home: Single point cane  PLOF: Independent, Independent with basic ADLs, Independent with gait, and Independent with transfers  PATIENT GOALS: tune up on high level strength and balance, see what PT here has to offer   OBJECTIVE:  Note: Objective measures were completed at Evaluation unless otherwise noted.    COGNITION: Overall cognitive status: Within functional limits for tasks assessed   SENSATION: Not tested          LOWER EXTREMITY MMT:    MMT Right Eval Left Eval  Hip flexion  3  Hip extension    Hip abduction  3  Hip adduction    Hip internal rotation    Hip external rotation    Knee flexion  4-  Knee extension  4-  Ankle dorsiflexion  4-  Ankle plantarflexion    Ankle inversion    Ankle eversion    (Blank rows = not tested)      STAIRS: Level of Assistance: Min A Stair Negotiation Technique: Step to Pattern with No Rails Number of Stairs: 4  Height of Stairs: 4 inch   Comments: Min A for balance due to anterior balance loss, difficulty clearing steps   GAIT: Gait pattern: step to pattern, decreased arm swing- Left, decreased step length- Right, decreased stride length, circumduction- Left, Left hip hike, lateral lean- Right, and decreased trunk rotation Distance walked: in clinic distances  Assistive device utilized: None Level of assistance: Complete Independence Comments: chronic CVA related impairments, ongoing spasticity and tone   FUNCTIONAL TESTS:  Timed up and go (TUG): 16.5 seconds no device  Dynamic Gait Index: 11/26  PATIENT SURVEYS:  ABC scale 87.5%                                                                                                                               TREATMENT DATE:  09/16/23 Tried elliptical, able to get on safely after education, but did not want to try Nustep level 5 x 6 minutes On airex volleyball 15# HS curls 2x10 left only 5# Leg extension left only 2x10 Educated on how he can do Actuary at the gym  on his own Leg press 20# both legs x10, then left only 2x10 Feet on ball bridge, trunk rotation, isometric abs PROM of the left shoulder STM to the left upper trap and the rhomboid  09/02/23 Nustep level 6 x 6 minutes LEg press 30# x10, left only 2x10 20# Leg curls 15# left only 2x10 Leg extension 5# 2x10 On airex left arm 5# row and extension CGA On airex volleyball  Rockerboard 2 ways Tmill push, with assist Passive stretch left shoulder Education on DN  08/19/23 NuStep L4 x 6 min CHECKED GOALS  Outdoor ambulation up and down curbs, through front middle [employer] LE on airex 2x10 Sides step on and off airex  Shoulder Ext green 2x10 AAROM Flex dowel  08/05/23 NuStep L 5 x 6 min S2S LE on airex 2x10 AAROM Shoulder Flex standing on airex x10 Sides step on and off airex  Alt 4in box taps  20lb resisted forward walking  07/22/23  Eval, care planning, discussion of goals of care,encouraged ongoing exercise at gym, discussed stretching appropriate for spasticity management, HR max vs RPE given medicines he's currently taking, PT focus   Nustep L7 x8 minutes during above education     PATIENT EDUCATION: Education details: see above  Person educated: Patient Education method: Programmer, multimedia, Demonstration, and Handouts Education comprehension: verbalized understanding, returned demonstration, and needs further education  HOME EXERCISE PROGRAM: Already on great program, will update PRN   GOALS: Goals reviewed with patient? Yes  SHORT TERM GOALS: Target date: 08/19/2023    Will be compliant with appropriate progressive HEP   Baseline: Goal status: Met 08/19/23 goes to the gym  2.  Will score at least 16 on DGI  Baseline:  Goal status: ongoing 09/16/23  3.  Will complete TUG in 12 seconds or less  Baseline:  Goal status: 08/19/23 Met 11:91   4.  Will be able to navigate steps and curbs on Mod(I) basis with minimal unsteadiness, no more than distant S  Baseline:  Goal status: Progressing 09/02/23 SBA to CGA  5.  Will be able to navigate incline in the back of the clinic (ascent and descent) with Mod(I) minimal unsteadiness  Baseline:  Goal status: Progressing 08/19/23  6.  Will score 100% on ABC to show subjective improvement  Baseline:  Goal status: 89.4% 08/19/23  7. Will be able use to use spastic L UE on machinery such as Nustep without difficulty  Goal status: Met 08/19/23 uses the machine at the gym   LONG TERM GOALS: Target date: LTG = STG      ASSESSMENT:  CLINICAL IMPRESSION: Patient is a 43 y.o. M who was seen today for physical therapy evaluation and treatment for G81.14 (ICD-10-CM) - Left spastic hemiplegia (HCC). He is really very functional given having significant ongoing chronic L hemiplegia. Patient has some fear of some of the exercises, machines and the dynamic surfaces, he was hesitant at first but would try the exercises, tended to want to hold on with the balance ex's at first , he did not want to attempt the dry needles, he was able to get on the elliptical safely with me but did not want to attempt.  I did some STM to the  upper traps and the rhomboids.Pt would really benefit from a tune up for high level balance and coordination with skilled PT services. Extremely motivated to continue improving and maintain functional status.   OBJECTIVE IMPAIRMENTS: Abnormal gait, decreased balance, decreased mobility, difficulty walking, decreased strength, and impaired  tone.   ACTIVITY LIMITATIONS: locomotion level  PARTICIPATION LIMITATIONS: driving, shopping, and community activity  PERSONAL  FACTORS: Age and Time since onset of injury/illness/exacerbation are also affecting patient's functional outcome.   REHAB POTENTIAL: Excellent  CLINICAL DECISION MAKING: Stable/uncomplicated  EVALUATION COMPLEXITY: Low  PLAN:  PT FREQUENCY: 2x/week  PT DURATION: 4 weeks  PLANNED INTERVENTIONS: 97164- PT Re-evaluation, 97110-Therapeutic exercises, 97530- Therapeutic activity, [id number]- Neuromuscular re-education, 97535- Self Care, 52841- Manual therapy, [id number]- Gait training, and [id number]- Aquatic Therapy  PLAN FOR NEXT SESSION:  focus on high level balance/coordination, curbs/hills. Already doing a lot of resistance training at the gym. Use RPE scale instead of HR monitoring. Requesting dry needling to his shoulder (OT not able to do this)  Stacie Glaze, PT 09/16/23 8:07 AM

## 2023-09-30 ENCOUNTER — Encounter
Payer: Managed Care, Other (non HMO) | Attending: Physical Medicine & Rehabilitation | Admitting: Physical Medicine & Rehabilitation

## 2023-09-30 ENCOUNTER — Encounter: Payer: Self-pay | Admitting: Physical Medicine & Rehabilitation

## 2023-09-30 VITALS — BP 113/75 | HR 78 | Ht 67.0 in | Wt 162.0 lb

## 2023-09-30 DIAGNOSIS — G8114 Spastic hemiplegia affecting left nondominant side: Secondary | ICD-10-CM | POA: Diagnosis not present

## 2023-09-30 MED ORDER — SODIUM CHLORIDE (PF) 0.9 % IJ SOLN
8.0000 mL | Freq: Once | INTRAMUSCULAR | Status: AC
  Administered 2023-09-30: 8 mL

## 2023-09-30 MED ORDER — ABOBOTULINUMTOXINA 500 UNITS IM SOLR
1500.0000 [IU] | Freq: Once | INTRAMUSCULAR | Status: AC
  Administered 2023-09-30: 1500 [IU] via INTRAMUSCULAR

## 2023-09-30 NOTE — Progress Notes (Signed)
Dysport Injection for spasticity using needle EMG guidance  Dilution: 200 Units/ml Indication: Severe spasticity which interferes with ADL,mobility and/or  hygiene and is unresponsive to medication management and other conservative care Informed consent was obtained after describing risks and benefits of the procedure with the patient. This includes bleeding, bruising, infection, excessive weakness, or medication side effects. A REMS form is on file and signed. Needle:  needle electrode Number of units per muscle LEFT FCR 300  FDS 100 FDP 200 Biceps 500 BR 200 FPL 100 FCU 100 All injections were done after obtaining appropriate EMG activity and after negative drawback for blood. The patient tolerated the procedure well. Post procedure instructions were given. A followup appointment was made.

## 2023-09-30 NOTE — Patient Instructions (Signed)
You received a Dysport injection today. You may experience muscle pains and aches. He may apply ice 20 minutes every 2 hours as needed for the next 24-48 hours. He also noticed bleeding or bruising in the areas that were injected. May apply Band-Aid. If this bruising is extensive, please notify our office. If there is evidence of increasing redness that occurs 2-3 days after injection. Please call our office. This could be a sign of infection. It is very rare, however. You may experience some muscle weakness in the muscles and injected. This would likely start in about one week.  

## 2023-10-06 ENCOUNTER — Other Ambulatory Visit: Payer: Self-pay | Admitting: Family Medicine

## 2023-10-06 DIAGNOSIS — R7303 Prediabetes: Secondary | ICD-10-CM

## 2023-10-07 ENCOUNTER — Encounter: Payer: Self-pay | Admitting: Internal Medicine

## 2023-10-08 ENCOUNTER — Other Ambulatory Visit: Payer: Self-pay

## 2023-10-08 ENCOUNTER — Other Ambulatory Visit: Payer: Self-pay | Admitting: Internal Medicine

## 2023-10-08 DIAGNOSIS — R7303 Prediabetes: Secondary | ICD-10-CM

## 2023-10-08 MED ORDER — METFORMIN HCL ER 500 MG PO TB24: 500.0000 mg | ORAL_TABLET | Freq: Every day | ORAL | 1 refills | Status: DC

## 2023-10-28 ENCOUNTER — Ambulatory Visit: Admitting: Physical Therapy

## 2023-10-28 ENCOUNTER — Ambulatory Visit: Admitting: Occupational Therapy

## 2023-11-11 ENCOUNTER — Ambulatory Visit: Admitting: Physical Therapy

## 2023-11-11 ENCOUNTER — Ambulatory Visit: Attending: Physical Medicine & Rehabilitation | Admitting: Occupational Therapy

## 2023-11-11 ENCOUNTER — Encounter: Payer: Self-pay | Admitting: Physical Therapy

## 2023-11-11 DIAGNOSIS — R208 Other disturbances of skin sensation: Secondary | ICD-10-CM | POA: Diagnosis present

## 2023-11-11 DIAGNOSIS — M6281 Muscle weakness (generalized): Secondary | ICD-10-CM | POA: Diagnosis present

## 2023-11-11 DIAGNOSIS — R2681 Unsteadiness on feet: Secondary | ICD-10-CM

## 2023-11-11 DIAGNOSIS — I69354 Hemiplegia and hemiparesis following cerebral infarction affecting left non-dominant side: Secondary | ICD-10-CM | POA: Diagnosis present

## 2023-11-11 DIAGNOSIS — R2689 Other abnormalities of gait and mobility: Secondary | ICD-10-CM | POA: Insufficient documentation

## 2023-11-11 NOTE — Therapy (Unsigned)
 OUTPATIENT OCCUPATIONAL THERAPY NEURO Treatment  Patient Name: Dennis Mercado MRN: [id number] DOB:1980-11-20, 43 y.o., male Today's Date: 11/11/2023  PCP: Dr. Tilmon Font REFERRING PROVIDER: Dr. Sharl Davies  END OF SESSION:  OT End of Session - 11/11/23 1534     Visit Number 4    Number of Visits 13    Date for OT Re-Evaluation 10/14/23    Authorization Type cigna    Authorization - Visit Number 4    OT Start Time 1533    OT Stop Time 1615    OT Time Calculation (min) 42 min              Past Medical History:  Diagnosis Date   Acute blood loss anemia    Acute respiratory failure (HCC)    Dysphagia, post-stroke    Grieving 12/20/2021   Hypertension    Hyponatremia    Lab Results      Component    Value    Date/Time           NA    138    07/08/2023 09:15 AM           NA    140    01/21/2023 09:31 AM           NA    139    07/09/2022 09:56 AM           NA    136    12/20/2021 11:20 AM           NA    137    06/27/2021 09:06 AM           NA    138    01/31/2021 11:00 AM           NA    140    10/31/2020 02:31 PM           Hypotension due to drugs    ICH (intracerebral hemorrhage) (HCC) 12/06/2019   Leukocytosis    Lab Results      Component    Value    Date/Time           WBC    5.9    07/09/2022 09:56 AM           WBC    7.2    06/27/2021 09:06 AM           WBC    6.6    10/05/2020 09:54 AM           WBC    7.9    03/28/2020 08:01 AM           WBC    6.8    01/10/2020 06:10 AM           Marijuana abuse    Normocytic anemia    Lab Results      Component    Value    Date/Time           HGB    14.3    07/09/2022 09:56 AM           HGB    13.5    06/27/2021 09:06 AM           HGB    13.2    10/05/2020 09:54 AM           HGB    13.0    03/28/2020 08:01 AM           HGB    12.1 (L)  01/10/2020 06:10 AM           Prediabetes 06/27/2021   Prerenal azotemia    Lab Results      Component    Value    Date           BUN    14    07/08/2023           BUN    12    01/21/2023           BUN     13    07/09/2022           BUN    11    12/20/2021           BUN    21    06/27/2021           Screening for prostate cancer 12/20/2021   Sleep disturbance    Transaminitis    Lab Results      Component    Value    Date           ALT    18    07/09/2022           AST    15    07/09/2022           ALKPHOS    52    06/27/2021           BILITOT    0.7    07/09/2022           Vascular headache    Past Surgical History:  Procedure Laterality Date   WISDOM TOOTH EXTRACTION     Patient Active Problem List   Diagnosis Date Noted   History of seizure 09/02/2023   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 09/02/2023   Ataxia 09/02/2023   Mixed hyperlipidemia 10/05/2020   History of CVA (cerebrovascular accident) 01/19/2020   Essential hypertension    Left hemiparesis (HCC)     ONSET DATE: referral date 06/02/24  REFERRING DIAG:   G81.14 (ICD-10-CM) - Left spastic hemiplegia (HCC)    THERAPY DIAG:  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)  Muscle weakness (generalized)  Hemiplegia and hemiparesis following cerebral infarction affecting left non-dominant side (HCC)  Unsteadiness on feet  Other abnormalities of gait and mobility  Other disturbances of skin sensation  Rationale for Evaluation and Treatment: Rehabilitation  SUBJECTIVE:   SUBJECTIVE STATEMENT: Pt reports he worked 6 days last week Pt accompanied by: self  PERTINENT HISTORY: Pt. presented to ED on 12/06/2019 with left side weakness and altered mental status. 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 Received inpatient rehab; was discharged 01/12/20. Pt returns to OT after dysport  on 05/27/23 by Dr. Sharl Davies; HTN, left inattention, apraxia   PRECAUTIONS: Fall  WEIGHT BEARING RESTRICTIONS: No  PAIN:  Are you having pain? No  FALLS: Has patient fallen in last 6 months? No  LIVING ENVIRONMENT: Lives  with: lives with their family Lives in: House/apartment   PLOF: Independent  PATIENT GOALS: increase functional use of LUE  OBJECTIVE:  Note: Objective measures were completed at Evaluation unless otherwise noted.  HAND DOMINANCE: Right  ADLs: Overall ADLs: mod I Transfers/ambulation related to ADLs:   IADLs:  Light housekeeping: mod I Meal Prep: mod I  MOBILITY STATUS: Independent   FUNCTIONAL OUTCOME MEASURES: Quick Dash: 42.5% disability   AROM: LUE: shoulder flexion 110, abduction 120, elbow  flexion/ extension -15/130, wrist is flexed at -40 at rest, supination/ pronation: neustral/ 70% supination, active wrist flexion to 60. Hand is fisted at rest with only trace finger flexion.    COORDINATION: unable to perform box/ blocks with LUE   SENSATION: Light touch: WFL    MUSCLE TONE: LUE: Moderate and Hypertonic  COGNITION: Overall cognitive status: increased time required for processing and directions    VISION ASSESSMENT: Not tested, pt wears glasses for near vision      OBSERVATIONS: Pleasant gentleman well known to this therapist from previous OT                                                                                                                             TREATMENT DATE:08/19/23 Weightbearing through left hand and elbow on mat, mod facilitation/ v.c for weightshifting over to left side.   Prone on elbows and knees then with hips on mat lifting chest, min-mod facilitation/ v.c Followed by tall kneeling rolling ball frowards and back for core stability and UE stretch, mod facilitation/ v.c, modified cat and cow withh elbows supported on ball Supine chest press and shoulder flexion with PVC pipe frame, min-mod facilitation initally and min v.c Gentle passive stretch to digits and wrist  in extension VMS 50 pps, 200 usec, 10 secs cycle, 5 sec ramp, intensity 36 x 8 mins for finger and wrist extensors, mod facilitation/ v.c, no adverse  reactions. Pt's fingers and wrist were losser after use. Pt was noted to have increased finger ext during on cycle. Therapist kept on lower stetting to avoid clawing Pt has estim at home, therapist recommends pt brings in for proper placement/ use.  08/05/23- Pt brought in a pre-fab splint he purchased online. Overall the splint fits well with the exception of the hand portion of the splint is a little short and allows DIP's to remain flexed. Pt reports he has additional splints that he will bring in. Pt may benefit from a new custom splint for improved positioning and DIP extension. Therapist discussed this with patient. Weightbearing through left hand and elbow on mat, mod facilitation/ v.c for weightshifting over to left side.  Pt was cautioned against over stretching digits and wrist and shown how to gently strech them in a better position. Prone on elbows lifting chest, min-mod facilitation/ v.c Followed by tall kneeling rolling ball frowards and back for core stability and UE stretch, mod facilitation/ v.c Quadraped with L hand over ball supported by therapist for increased weightbearing and core stability, mod facilitation. v.c Seated edge of mat closed chains shoulder flexion with PVC frame reaching for floor, followed by resting frame on knees for elbow flexion/ extension, min facilitation/ v.c   07/23/23- eval completed Supine pt perfromed butterfly stretch with min-mod facilitation Gentle passive left shoulder flexion, followed by prone on elbows lifting chest, min-mod facilitation/ v.c         PATIENT EDUCATION: Education details:see above Person educated: Patient Education method:  Explanation, demonstration, v.c , Education comprehension: verbalized understanding, returned dmeonstration  HOME EXERCISE PROGRAM: not issued yet   GOALS: Goals reviewed with patient? Yes  SHORT TERM GOALS: Target date: 08/19/23  I with HEP Baseline: Goal status:  ongoing  2.  I with  positioning to minimize risk for contracture and to increase functional use.  Goal status:  ongoing, pt to bring in his additional splints 08/19/23  3.  Pt will verbalize understanding of AE/adapted strategies to maximize safety and I with ADLs/ IADLs.  Goal status:  ongoing  4.  Pt will increase LUE shoulder flexion to 115* in prep for functional reach.  Goal status:  met 125    LONG TERM GOALS: Target date: 10/14/23  I with updated HEP  Goal status:   2.  Pt will be able to use bilateral UE's to cut with a knife and fork with AE/ adapted strategies prn  Goal status: met with fork with  built up handle, min-mod difficulty, pt is able to cut better with a rocker knife.  3.  Pt will use LUE as a stabilizer/ gross A at least 20% of the time Baseline: 10% only Goal status: met per pt report      ASSESSMENT:  CLINICAL IMPRESSION:Pt is progressing towards goals. He is limited by spasticity. Pt reports he plans to f/u with his MD regarding botox . Pt to bring in estim unit from home next visit as well as additional splints.PERFORMANCE DEFICITS: in functional skills including ADLs, IADLs, coordination, dexterity, sensation, tone, ROM, strength, flexibility, Fine motor control, Gross motor control, mobility, balance, decreased knowledge of precautions, decreased knowledge of use of DME, and UE functional use, cognitive skills including problem solving, thought, and understand, and psychosocial skills including coping strategies, environmental adaptation, habits, interpersonal interactions, and routines and behaviors.   IMPAIRMENTS: are limiting patient from ADLs, IADLs, rest and sleep, work, play, leisure, and social participation.   CO-MORBIDITIES: may have co-morbidities  that affects occupational performance. Patient will benefit from skilled OT to address above impairments and improve overall function.  MODIFICATION OR ASSISTANCE TO COMPLETE EVALUATION: No modification of tasks or  assist necessary to complete an evaluation.  OT OCCUPATIONAL PROFILE AND HISTORY: Detailed assessment: Review of records and additional review of physical, cognitive, psychosocial history related to current functional performance.  CLINICAL DECISION MAKING: LOW - limited treatment options, no task modification necessary  REHAB POTENTIAL: Good  EVALUATION COMPLEXITY: Low    PLAN:  OT FREQUENCY: 1x/week  OT DURATION: 12 weeksplus eval  PLANNED INTERVENTIONS: 97168 OT Re-evaluation, 97535 self care/ADL training, 16109 therapeutic exercise, 97530 therapeutic activity, 97112 neuromuscular re-education, 97140 manual therapy, 97113 aquatic therapy, 97035 ultrasound, 97018 paraffin, 60454 moist heat, 97010 cryotherapy, 97034 contrast bath, 97014 electrical stimulation unattended, 97129 Cognitive training (first 15 min), 09811 Cognitive training(each additional 15 min), 91478 Orthotics management and training, 29562 Splinting (initial encounter), [id number] Subsequent splinting/medication, passive range of motion, functional mobility training, visual/perceptual remediation/compensation, energy conservation, coping strategies training, patient/family education, and DME and/or AE instructions  RECOMMENDED OTHER SERVICES: PT  CONSULTED AND AGREED WITH PLAN OF CARE: Patient  PLAN FOR NEXT SESSION: assess splinting needs, education regarding placement of electrodes for home estim unit.   Oceanna Arruda, OT 11/11/2023, 3:37 PM

## 2023-11-11 NOTE — Therapy (Signed)
 OUTPATIENT PHYSICAL THERAPY NEURO DISCHARGE   Patient Name: Dennis Mercado MRN: [id number] DOB:Jun 19, 1980, 43 y.o., male Today's Date: 11/11/2023   PCP: Tonna Frederic MD  REFERRING PROVIDER: Genetta Kenning, MD  PHYSICAL THERAPY DISCHARGE SUMMARY  Visits from Start of Care: 6  Current functional level related to goals / functional outcomes: See below    Remaining deficits: See below    Education / Equipment: See below    Patient agrees to discharge. Patient goals were partially met. Patient is being discharged due to being pleased with the current functional level.   END OF SESSION:  PT End of Session - 11/11/23 1329     Visit Number 6    Authorization Type [employer]    PT Start Time 1314   pt late   PT Stop Time 1342    PT Time Calculation (min) 28 min    Activity Tolerance Patient tolerated treatment well    Behavior During Therapy WFL for tasks assessed/performed              Past Medical History:  Diagnosis Date   Acute blood loss anemia    Acute respiratory failure (HCC)    Dysphagia, post-stroke    Grieving 12/20/2021   Hypertension    Hyponatremia    Lab Results      Component    Value    Date/Time           NA    138    07/08/2023 09:15 AM           NA    140    01/21/2023 09:31 AM           NA    139    07/09/2022 09:56 AM           NA    136    12/20/2021 11:20 AM           NA    137    06/27/2021 09:06 AM           NA    138    01/31/2021 11:00 AM           NA    140    10/31/2020 02:31 PM           Hypotension due to drugs    ICH (intracerebral hemorrhage) (HCC) 12/06/2019   Leukocytosis    Lab Results      Component    Value    Date/Time           WBC    5.9    07/09/2022 09:56 AM           WBC    7.2    06/27/2021 09:06 AM           WBC    6.6    10/05/2020 09:54 AM           WBC    7.9    03/28/2020 08:01 AM           WBC    6.8    01/10/2020 06:10 AM           Marijuana abuse    Normocytic anemia    Lab Results      Component     Value    Date/Time           HGB    14.3    07/09/2022 09:56 AM  HGB    13.5    06/27/2021 09:06 AM           HGB    13.2    10/05/2020 09:54 AM           HGB    13.0    03/28/2020 08:01 AM           HGB    12.1 (L)    01/10/2020 06:10 AM           Prediabetes 06/27/2021   Prerenal azotemia    Lab Results      Component    Value    Date           BUN    14    07/08/2023           BUN    12    01/21/2023           BUN    13    07/09/2022           BUN    11    12/20/2021           BUN    21    06/27/2021           Screening for prostate cancer 12/20/2021   Sleep disturbance    Transaminitis    Lab Results      Component    Value    Date           ALT    18    07/09/2022           AST    15    07/09/2022           ALKPHOS    52    06/27/2021           BILITOT    0.7    07/09/2022           Vascular headache    Past Surgical History:  Procedure Laterality Date   WISDOM TOOTH EXTRACTION     Patient Active Problem List   Diagnosis Date Noted   History of seizure 09/02/2023   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 09/02/2023   Ataxia 09/02/2023   Mixed hyperlipidemia 10/05/2020   History of CVA (cerebrovascular accident) 01/19/2020   Essential hypertension    Left hemiparesis (HCC)     ONSET DATE: chronic   REFERRING DIAG:  Diagnosis  G81.14 (ICD-10-CM) - Left spastic hemiplegia (HCC)    THERAPY DIAG:  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 Still going to the gym, doing TM and doing resistance work.     EVAL: The original  reason I'm coming is for the shoulder, wanted to try to some dry needling. I've been to a lot of [facility] clinics, wanted to see what PT here had to offer while I'm here. Curbs and higher inclines are tough. I'm in the gym, also try to walk at least 30 min at speed 1.4 on the TM, doing inclines on TM at the gym too.   Pt accompanied by: self  PERTINENT HISTORY: see above   PAIN:  Are you having pain? No 0/10  PRECAUTIONS: None  RED FLAGS: None   WEIGHT BEARING RESTRICTIONS: No  FALLS: Has patient fallen in last 6 months? No  LIVING ENVIRONMENT: Lives with: lives with their spouse Lives in: House/apartment Stairs: 1 STE, one level home Has following equipment at home: Single point cane  PLOF: Independent, Independent with basic ADLs, Independent with gait, and Independent with transfers  PATIENT GOALS: tune up on high level strength and balance, see what PT here has to offer   OBJECTIVE:  Note: Objective measures were completed at Evaluation unless otherwise noted.    COGNITION: Overall cognitive status: Within functional limits for tasks assessed   SENSATION: Not tested          LOWER EXTREMITY MMT:    MMT Right Eval Left Eval Left 11/11/23  Hip flexion  3   Hip extension     Hip abduction  3   Hip adduction     Hip internal rotation     Hip external rotation     Knee flexion  4-   Knee extension  4-   Ankle dorsiflexion  4-   Ankle plantarflexion     Ankle inversion     Ankle eversion     (Blank rows = not tested)      STAIRS: Level of Assistance: Min A Stair Negotiation Technique: Step to Pattern with No Rails Number of Stairs: 4  Height of Stairs: 4 inch   Comments: Min A for balance due to anterior balance loss, difficulty clearing steps   GAIT: Gait pattern: step to pattern, decreased arm swing- Left, decreased step length- Right, decreased stride length, circumduction- Left, Left hip hike, lateral lean- Right, and decreased trunk  rotation Distance walked: in clinic distances  Assistive device utilized: None Level of assistance: Complete Independence Comments: chronic CVA related impairments, ongoing spasticity and tone   FUNCTIONAL TESTS:  Timed up and go (TUG): 16.5 seconds no device  Dynamic Gait Index: 11/26     11/11/23 0001  Dynamic Gait Index  Level Surface 2  Change in Gait Speed 2  Gait with Horizontal Head Turns 2  Gait with Vertical Head Turns 2  Gait and Pivot Turn 2  Step Over Obstacle 2  Step Around Obstacles 2  Steps 1  Total Score 15    PATIENT SURVEYS:  ABC scale 87.5%; 11/11/23 84.4%  TREATMENT DATE:   11/11/23  DGI, ABC score, education on current mobility and  DC today  Forward step ups 6 inch step power up/eccentric lower x15 L LE  STS with 8# and 2 inch box under R foot x12     09/16/23 Tried elliptical, able to get on safely after education, but did not want to try Nustep level 5 x 6 minutes On airex volleyball 15# HS curls 2x10 left only 5# Leg extension left only 2x10 Educated on how he can do eccentrics at the gym on his own Leg press 20# both legs x10, then left only 2x10 Feet on ball bridge, trunk rotation, isometric abs PROM of the left shoulder STM to the left upper trap and the rhomboid   PATIENT EDUCATION: Education details: see above  Person educated: Patient Education method: Programmer, multimedia, Demonstration, and Handouts Education comprehension: verbalized understanding, returned demonstration, and needs further education  HOME EXERCISE PROGRAM: Already on great program, will update PRN   GOALS: Goals reviewed with patient? Yes  SHORT TERM GOALS: Target date: 08/19/2023    Will be compliant with appropriate progressive HEP  Baseline: Goal status: Met 08/19/23 goes to the gym  2.  Will score at least 16 on DGI  Baseline:  Goal  status: NOT MET 10/2723   3.  Will complete TUG in 12 seconds or less  Baseline:  Goal status: 08/19/23 Met 11:91   4.  Will be able to navigate steps and curbs on Mod(I) basis with minimal unsteadiness, no more than distant S  Baseline:  Goal status: NOT MET but likely at baseline 11/11/23  5.  Will be able to navigate incline in the back of the clinic (ascent and descent) with Mod(I) minimal unsteadiness  Baseline:  Goal status: 11/11/23 DNT due to weather   6.  Will score 100% on ABC to show subjective improvement  Baseline:  Goal status: NOT MET 11/11/23  7. Will be able use to use spastic L UE on machinery such as Nustep without difficulty  Goal status: Met 08/19/23 uses the machine at the gym   LONG TERM GOALS: Target date: LTG = STG      ASSESSMENT:  CLINICAL IMPRESSION:   Pt arrives after having been out of PT for 2 months; had a busy morning due to dentist- checked goals primarily today due to late arrival. Had more difficulty with mobility today due to having had multiple appointments this AM and not having had time to stretch before PT. He is interested in possibly continuing PT but at [facility] or [facility] due to lower copay. DC from our care today, thank you for the referral!     OBJECTIVE IMPAIRMENTS: Abnormal gait, decreased balance, decreased mobility, difficulty walking, decreased strength, and impaired tone.   ACTIVITY LIMITATIONS: locomotion level  PARTICIPATION LIMITATIONS: driving, shopping, and community activity  PERSONAL FACTORS: Age and Time since onset of injury/illness/exacerbation are also affecting patient's functional outcome.   REHAB POTENTIAL: Excellent  CLINICAL DECISION MAKING: Stable/uncomplicated  EVALUATION COMPLEXITY: Low  PLAN:  PT FREQUENCY: 2x/week  PT DURATION: 4 weeks  PLANNED INTERVENTIONS: [id number]- PT Re-evaluation, 97110-Therapeutic exercises, 97530- Therapeutic activity, [id number]- Neuromuscular re-education, 97535- Self  Care, [id number]- Manual therapy, [phone]- Gait training, and [phone]- Aquatic Therapy  PLAN FOR NEXT SESSION:  DC TODAY   Terrel Ferries, PT, DPT 11/11/23 1:54 PM

## 2023-11-11 NOTE — Patient Instructions (Signed)
   ROM: Extension - Wand (Standing)   Stand holding wand behind back. Raise arms  gently  Repeat 10 times per set.  Do 1-2 sessions per day. Repeat bending elbows behind your back holding cane then straightening elbows 10 reps 1x day

## 2023-11-12 ENCOUNTER — Ambulatory Visit: Admitting: Internal Medicine

## 2023-11-12 ENCOUNTER — Encounter: Payer: Self-pay | Admitting: Internal Medicine

## 2023-11-12 VITALS — BP 112/72 | HR 69 | Temp 98.0°F | Ht 67.0 in | Wt 164.4 lb

## 2023-11-12 DIAGNOSIS — Z7984 Long term (current) use of oral hypoglycemic drugs: Secondary | ICD-10-CM

## 2023-11-12 DIAGNOSIS — Z0001 Encounter for general adult medical examination with abnormal findings: Secondary | ICD-10-CM | POA: Diagnosis not present

## 2023-11-12 DIAGNOSIS — Z8673 Personal history of transient ischemic attack (TIA), and cerebral infarction without residual deficits: Secondary | ICD-10-CM | POA: Diagnosis not present

## 2023-11-12 DIAGNOSIS — R270 Ataxia, unspecified: Secondary | ICD-10-CM

## 2023-11-12 DIAGNOSIS — E119 Type 2 diabetes mellitus without complications: Secondary | ICD-10-CM

## 2023-11-12 DIAGNOSIS — E782 Mixed hyperlipidemia: Secondary | ICD-10-CM

## 2023-11-12 DIAGNOSIS — G8194 Hemiplegia, unspecified affecting left nondominant side: Secondary | ICD-10-CM | POA: Diagnosis not present

## 2023-11-12 DIAGNOSIS — I10 Essential (primary) hypertension: Secondary | ICD-10-CM

## 2023-11-12 LAB — CBC WITH DIFFERENTIAL/PLATELET
Basophils Absolute: 0 10*3/uL (ref 0.0–0.1)
Basophils Relative: 0.5 % (ref 0.0–3.0)
Eosinophils Absolute: 0 10*3/uL (ref 0.0–0.7)
Eosinophils Relative: 0.8 % (ref 0.0–5.0)
HCT: 44.2 % (ref 39.0–52.0)
Hemoglobin: 14.2 g/dL (ref 13.0–17.0)
Lymphocytes Relative: 17.3 % (ref 12.0–46.0)
Lymphs Abs: 1 10*3/uL (ref 0.7–4.0)
MCHC: 32.1 g/dL (ref 30.0–36.0)
MCV: 82.3 fl (ref 78.0–100.0)
Monocytes Absolute: 0.5 10*3/uL (ref 0.1–1.0)
Monocytes Relative: 8.9 % (ref 3.0–12.0)
Neutro Abs: 4.2 10*3/uL (ref 1.4–7.7)
Neutrophils Relative %: 72.5 % (ref 43.0–77.0)
Platelets: 231 10*3/uL (ref 150.0–400.0)
RBC: 5.38 Mil/uL (ref 4.22–5.81)
RDW: 14.6 % (ref 11.5–15.5)
WBC: 5.8 10*3/uL (ref 4.0–10.5)

## 2023-11-12 LAB — COMPREHENSIVE METABOLIC PANEL WITH GFR
ALT: 21 U/L (ref 0–53)
AST: 20 U/L (ref 0–37)
Albumin: 4.8 g/dL (ref 3.5–5.2)
Alkaline Phosphatase: 36 U/L — ABNORMAL LOW (ref 39–117)
BUN: 14 mg/dL (ref 6–23)
CO2: 29 meq/L (ref 19–32)
Calcium: 9.7 mg/dL (ref 8.4–10.5)
Chloride: 102 meq/L (ref 96–112)
Creatinine, Ser: 1.21 mg/dL (ref 0.40–1.50)
GFR: 73.71 mL/min (ref >60.00)
Glucose, Bld: 84 mg/dL (ref 70–99)
Potassium: 4.3 meq/L (ref 3.5–5.1)
Sodium: 138 meq/L (ref 135–145)
Total Bilirubin: 0.6 mg/dL (ref 0.2–1.2)
Total Protein: 7.4 g/dL (ref 6.0–8.3)

## 2023-11-12 LAB — LIPID PANEL
Cholesterol: 110 mg/dL (ref 0–200)
HDL: 39.2 mg/dL (ref >39.00)
LDL Cholesterol: 60 mg/dL (ref 0–99)
NonHDL: 70.43
Total CHOL/HDL Ratio: 3
Triglycerides: 51 mg/dL (ref 0.0–149.0)
VLDL: 10.2 mg/dL (ref 0.0–40.0)

## 2023-11-12 LAB — TSH: TSH: 1.94 u[IU]/mL (ref 0.35–5.50)

## 2023-11-12 LAB — HEMOGLOBIN A1C: Hgb A1c MFr Bld: 6.3 % (ref 4.6–6.5)

## 2023-11-12 LAB — MICROALBUMIN / CREATININE URINE RATIO
Creatinine,U: 47.2 mg/dL
Microalb Creat Ratio: UNDETERMINED mg/g (ref 0.0–30.0)
Microalb, Ur: <0.7 mg/dL

## 2023-11-12 NOTE — Assessment & Plan Note (Signed)
 Currently on rosuvastatin  with good tolerance and no side effects. The goal is to lower LDL cholesterol below 40 mg/dL due to stroke. Insurance limitations were discussed regarding medication coverage. Perform lab tests to assess current cholesterol levels. Consider additional cholesterol-lowering medications based on lab results and cost-benefit analysis.

## 2023-11-12 NOTE — Assessment & Plan Note (Signed)
 Blood pressure is well-controlled on multiple antihypertensive medications. Discussed the potential to reduce medication load. Discontinue lisinopril  due to good blood pressure control and potential issues in patients with African heritage. Monitor blood pressure after discontinuation.

## 2023-11-12 NOTE — Assessment & Plan Note (Signed)
 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 There is no current neurologist follow-up. Refer to a neurologist for further evaluation and management. Continue physical and occupational therapy.

## 2023-11-12 NOTE — Patient Instructions (Signed)
 IMPORTANT HEALTH REMINDERS: Report any new or changing skin lesions promptly Maintain recommended screening schedules Discuss any new family history of cancer at future visits Follow up on any new symptoms that persist more than two weeks

## 2023-11-12 NOTE — Progress Notes (Signed)
 [facility] at [facility] [address] [place], [place] [address] Office:  [phone]  -- Annual Preventive Medical Office Visit --  Patient:  Dennis Mercado      Age: 43 y.o.       Sex:  male  Date:   11/12/2023 Patient Care Team: Anthon Kins, MD as PCP - General (Internal Medicine) Today's Healthcare Provider: Anthon Kins, MD  ========================================= Chief complaint: Annual Exam (Pt is present for cpe he is fasting today )  Purpose of Visit: Comprehensive preventive health assessment and personalized health maintenance planning.  This encounter was conducted as a Comprehensive Physical Exam (CPE) preventive care annual visit. The patient's medical history and problem list were reviewed to inform individualized preventive care recommendations.   No problem-specific medical treatment was provided during this visit.     Assessment & Plan Encounter for annual general medical examination with abnormal findings in adult Completed Comprehensive Physical Exam (CPE) preventive care annual visit today. History of CVA (cerebrovascular accident) Discussed the potential use of Ozempic, which may reduce stroke risk by 25%. He is considering the medication and will conduct further research. Insurance coverage and FDA approval were discussed as factors in decision-making. Consider starting Ozempic pending his decision and insurance approval. Provide information on Ozempic for further consideration. 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 There is no current neurologist follow-up. Refer to a neurologist for further evaluation and management. Continue physical and occupational therapy. Mixed hyperlipidemia Currently on rosuvastatin  with good tolerance and no side effects. The goal is to lower LDL cholesterol below 40 mg/dL due to stroke. Insurance  limitations were discussed regarding medication coverage. Perform lab tests to assess current cholesterol levels. Consider additional cholesterol-lowering medications based on lab results and cost-benefit analysis. Essential hypertension Blood pressure is well-controlled on multiple antihypertensive medications. Discussed the potential to reduce medication load. Discontinue lisinopril  due to good blood pressure control and potential issues in patients with African heritage. Monitor blood pressure after discontinuation. 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 Currently on metformin . Discussed the potential to discontinue metformin  if lab results show continued good control. Perform lab tests to assess current A1c and blood glucose levels. Consider discontinuing metformin  if lab results indicate continued good control.   Diagnoses and all orders for this visit: Encounter for annual general medical examination with abnormal findings in adult -     CBC with Differential/Platelet -     Comprehensive metabolic panel with GFR -     Lipid panel -     Hemoglobin A1c -     Microalbumin / creatinine urine ratio -     TSH   Reviewed/updated/encouraged completion: Immunization History  Administered Date(s) Administered   Influenza-Unspecified 05/22/2019   Health Maintenance  Topic Date Due   FOOT EXAM  10/01/2023   Pneumococcal Vaccine 50-5 Years old (1 of 2 - PCV) 07/07/2024 (Originally 01/28/2000)   Hepatitis C Screening  09/01/2024 (Originally 01/28/1999)   OPHTHALMOLOGY EXAM  11/16/2023   HEMOGLOBIN A1C  01/05/2024   Diabetic kidney evaluation - eGFR measurement  07/07/2024   Diabetic kidney evaluation - Urine ACR  07/07/2024   HIV Screening  Completed   HPV VACCINES  Aged Out   Meningococcal B Vaccine  Aged Out  DTaP/Tdap/Td  Discontinued    INFLUENZA VACCINE  Discontinued   COVID-19 Vaccine  Discontinued    Reviewed the following verbally with patient and provided AVS materials:  HEALTH MAINTENANCE COUNSELING AND ANTICIPATORY GUIDANCE   Preventive Measure Recommendation  Eye Exams Every 1-2 years  Dental Care Cleanings every 6 months or more, brush/floss 3x daily  Sinus Care Saline spray rinses daily  Sleep 8 hours nightly, good sleep hygiene, e-monitoring if any daytime drowsiness  Diet Fruits/vegetables/fiber/healthy fats, balance and moderation  Exercise 150 minutes weekly  Risk Behaviors Discouraged any/all high risk behaviors   CANCER SCREENING SHARED DECISION MAKING   Penile/Testicle/Scrotum Encouraged self-monitoring and reporting of genital abnormalities. Patient reports none.  Thyroid Checked and advised to palpate thyroid for nodules  Prostate Individualized risks/benefits/costs discussed   PSA RESULTS: Lab Results  Component Value Date   PSA 1.84 12/20/2021  Shared decision making not to repeat this year, due to insurance likely not to cover.      Colon HM Colonoscopy   No special risk factors identified to allow insurance coverage prior to age 67.   Lung Current guidelines recommend individuals aged 64 to 55 who currently smoke or formerly smoked and have a >= 20 pack-year smoking history should undergo annual screening with low-dose computed tomography (LDCT). Tobacco Use: Low Risk  (11/12/2023)   Patient History    Smoking Tobacco Use: Never    Smokeless Tobacco Use: Never    Passive Exposure: Not on file    Skin Advised regular sunscreen use. Patient denies worrisome, changing, or new skin lesions. Offered to include images in chart for surveillance.  Other Cancers Discussed lack of screening guidelines and insurance coverage for other cancer types.     Discussed the use of AI scribe software for clinical note transcription with the patient, who gave verbal consent to proceed.  History of  Present Illness Talbot Monarch is a 42 year old male with a history of stroke, diabetes, and hypertension who presents for a follow-up regarding cholesterol management and medication review.  He is currently taking rosuvastatin  without any side effects and is interested in comparing his lab results to when he was on atorvastatin . He is fasting today for lab work and practices intermittent fasting, typically not eating until noon.  He is on multiple medications including lisinopril , amlodipine , carbatrol, and Carilol, which he takes twice daily. His blood pressure has improved, although he has not lost weight despite regular exercise. He is concerned about metformin  due to a family history of diabetes, noting his grandmother had diabetes. His A1c was 6.3 in January, and he is interested in seeing if it remains stable with today's labs.  He has a history of stroke and is concerned about the risk of another stroke. He has been attending physical and occupational therapy sessions and is working on his physical rehabilitation. He notes some residual effects from the stroke, such as a slight slur in speech and hyperreflexia in the left leg. He wears a brace at night and stretches in the morning.  He discusses his family history, mentioning that his mother was supposed to be on amlodipine  but did not take it, and she died of a heart attack in her sleep. He is concerned about the implications of not taking prescribed medications.  He avoids fast food despite the temptation and is working on maintaining a healthy diet. 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    ROS A  comprehensive ROS was negative for any concerning symptoms.   Completed medication reconciliation: Current Outpatient Medications on File Prior to Visit  Medication Sig   amLODipine  (NORVASC ) 10 MG tablet Take 1 tablet (10 mg total) by mouth daily.   carvedilol  (COREG ) 25 MG tablet TAKE 1  TABLET(25 MG) BY MOUTH TWICE DAILY WITH A MEAL   DYSPORT  500 units SOLR injection INJECT LEFT BICEPS 500 UNITS, FPL 100 UNITS, FCR 300 UNITS, PALMARIS LONGUS 100 UNITS, FDS 200 UNITS, FDP 300 UNITS. AS DIRECTED TO BE ADMINISTERED IN PRESCRIBERS OFFICE EVERY 3 MONTHS AS DIRECTED   metFORMIN  (GLUCOPHAGE -XR) 500 MG 24 hr tablet TAKE 1 TABLET(500 MG) BY MOUTH DAILY WITH BREAKFAST   rosuvastatin  (CRESTOR ) 40 MG tablet Take 1 tablet (40 mg total) by mouth daily. Replaces atorvastatin    tadalafil (CIALIS) 5 MG tablet Take 5 mg by mouth daily as needed.   Current Facility-Administered Medications on File Prior to Visit  Medication   AbobotulinumtoxinA  (DYSPORT ) 500 units injection 1,500 Units   sodium chloride  (PF) 0.9 % injection 7.5 mL   Medications Discontinued During This Encounter  Medication Reason   lisinopril  (ZESTRIL ) 10 MG tablet Completed Course  The following were reviewed and/or entered/updated into our electronic MEDICAL RECORD NUMBERPast Medical History:  Diagnosis Date   Acute blood loss anemia    Acute respiratory failure (HCC)    Dysphagia, post-stroke    Grieving 12/20/2021   Hypertension    Hyponatremia    Lab Results      Component    Value    Date/Time           NA    138    07/08/2023 09:15 AM           NA    140    01/21/2023 09:31 AM           NA    139    07/09/2022 09:56 AM           NA    136    12/20/2021 11:20 AM           NA    137    06/27/2021 09:06 AM           NA    138    01/31/2021 11:00 AM           NA    140    10/31/2020 02:31 PM           Hypotension due to drugs    ICH (intracerebral hemorrhage) (HCC) 12/06/2019   Leukocytosis    Lab Results      Component    Value    Date/Time           WBC    5.9    07/09/2022 09:56 AM           WBC    7.2    06/27/2021 09:06 AM           WBC    6.6    10/05/2020 09:54 AM           WBC    7.9    03/28/2020 08:01 AM           WBC    6.8    01/10/2020 06:10 AM           Marijuana abuse    Normocytic anemia    Lab Results       Component    Value  Date/Time           HGB    14.3    07/09/2022 09:56 AM           HGB    13.5    06/27/2021 09:06 AM           HGB    13.2    10/05/2020 09:54 AM           HGB    13.0    03/28/2020 08:01 AM           HGB    12.1 (L)    01/10/2020 06:10 AM           Prediabetes 06/27/2021   Prerenal azotemia    Lab Results      Component    Value    Date           BUN    14    07/08/2023           BUN    12    01/21/2023           BUN    13    07/09/2022           BUN    11    12/20/2021           BUN    21    06/27/2021           Screening for prostate cancer 12/20/2021   Sleep disturbance    Transaminitis    Lab Results      Component    Value    Date           ALT    18    07/09/2022           AST    15    07/09/2022           ALKPHOS    52    06/27/2021           BILITOT    0.7    07/09/2022           Vascular headache    Past Surgical History:  Procedure Laterality Date   WISDOM TOOTH EXTRACTION     Social History   Socioeconomic History   Marital status: Married    Spouse name: Not on file   Number of children: Not on file   Years of education: Not on file   Highest education level: Associate degree: occupational, Scientist, product/process development, or vocational program  Occupational History   Not on file  Tobacco Use   Smoking status: Never   Smokeless tobacco: Never  Vaping Use   Vaping status: Never Used  Substance and Sexual Activity   Alcohol use: Yes    Comment: several drinks over the weekend   Drug use: Not Currently    Types: Marijuana   Sexual activity: Yes    Birth control/protection: Condom  Other Topics Concern   Not on file  Social History Narrative   Not on file   Social Drivers of Health   Financial Resource Strain: Low Risk  (07/04/2023)   Overall Financial Resource Strain (CARDIA)    Difficulty of Paying Living Expenses: Not hard at all  Food Insecurity: No Food Insecurity (07/04/2023)   Hunger Vital Sign    Worried About Running Out of Food in the Last Year: Never  true    Ran Out of Food in the Last Year: Never true  Transportation Needs:  No Transportation Needs (07/04/2023)   PRAPARE - Administrator, Civil Service (Medical): No    Lack of Transportation (Non-Medical): No  Physical Activity: Sufficiently Active (07/04/2023)   Exercise Vital Sign    Days of Exercise per Week: 3 days    Minutes of Exercise per Session: 60 min  Stress: No Stress Concern Present (07/04/2023)   [employer] of Occupational Health - Occupational Stress Questionnaire    Feeling of Stress : Not at all  Social Connections: Moderately Integrated (07/04/2023)   Social Connection and Isolation Panel [NHANES]    Frequency of Communication with Friends and Family: Three times a week    Frequency of Social Gatherings with Friends and Family: Once a week    Attends Religious Services: More than 4 times per year    Active Member of [employer] or Organizations: No    Attends Banker Meetings: Not on file    Marital Status: Married  Intimate Partner Violence: Not on file      07/04/2023    8:52 AM  Alcohol Use Disorder Test (AUDIT)  1. How often do you have a drink containing alcohol? 2  2. How many drinks containing alcohol do you have on a typical day when you are drinking? 0  3. How often do you have six or more drinks on one occasion? 0  AUDIT-C Score 2      Patient-reported   Family History  Problem Relation Age of Onset   Diabetes Maternal Grandmother   No Known Allergies Social History   Substance and Sexual Activity  Sexual Activity Yes   Birth control/protection: Condom   Social History   Tobacco Use   Smoking status: Never   Smokeless tobacco: Never  Vaping Use   Vaping status: Never Used  Substance Use Topics   Alcohol use: Yes    Comment: several drinks over the weekend   Drug use: Not Currently    Types: Marijuana      11/12/2023    8:35 AM  Depression screen PHQ 2/9  Decreased Interest 0  Down, Depressed, Hopeless 0   PHQ - 2 Score 0  Altered sleeping 0  Tired, decreased energy 0  Change in appetite 0  Feeling bad or failure about yourself  0  Trouble concentrating 0  Moving slowly or fidgety/restless 0  Suicidal thoughts 0  PHQ-9 Score 0  Difficult doing work/chores Not difficult at all      09/30/2023    9:57 AM  Fall Risk   Falls in the past year? 0     BP 112/72   Pulse 69   Temp 98 F (36.7 C) (Temporal)   Ht 5\' 7"  (1.702 m)   Wt 164 lb 6.4 oz (74.6 kg)   SpO2 98%   BMI 25.75 kg/m  BP Readings from Last 3 Encounters:  11/12/23 112/72  09/30/23 113/75  09/02/23 118/80   Wt Readings from Last 10 Encounters:  11/12/23 164 lb 6.4 oz (74.6 kg)  09/30/23 162 lb (73.5 kg)  09/02/23 164 lb 6.4 oz (74.6 kg)  07/08/23 164 lb 6.4 oz (74.6 kg)  05/27/23 164 lb (74.4 kg)  05/27/23 164 lb (74.4 kg)  01/21/23 158 lb (71.7 kg)  01/21/23 157 lb 9.6 oz (71.5 kg)  12/12/22 157 lb 9.6 oz (71.5 kg)  10/08/22 157 lb 12.8 oz (71.6 kg)    GEN: No acute distress, resting comfortably. HEENT: Tympanic membranes normal appearing bilaterally, oropharynx clear, no thyromegaly noted,  no palpable lymphadenopathy or thyroid nodules. CARDIOVASCULAR: S1 and S2 heart sounds with regular rate and rhythm, no murmurs appreciated. PULMONARY: Normal work of breathing, clear to auscultation bilaterally, no crackles, wheezes, or rhonchi. ABDOMEN: Soft, nontender, nondistended. MSK: No edema, cyanosis, or clubbing noted. SKIN: Warm, dry, no lesions of concern observed. NEUROLOGICAL:  Cranial nerves grossly intact. But droop in left smile on formal testing. Hyperreflexic left leg. Contracture in left hand. Slight asymmetry in smile, left side affected. Left pupil less reactive than right. Residual deficits from stroke noted. PSYCH: Normal affect and thought content, pleasant and cooperative.      ======================================  Notes:  This document was synthesized by artificial intelligence  ([employer]bridge) using HIPAA-compliant recording of the clinical interaction;   We discussed the use of AI scribe software for clinical note transcription with the patient, who gave verbal consent to proceed.    This encounter employed state-of-the-art, real-time, collaborative documentation. The patient was empowered to actively review and assist in updating their electronic medical record on a shared monitor, ensuring transparency and improving accuracy.    Prior to and at the beginning of Comprehensive Physical Exam (CPE) preventive care annual visit appointment types  we clarify to patients "Our goal today is to focus on your preventive or annual Comprehensive Physical Exam (CPE) preventive care annual visit, which typically covers routine screenings and overall health maintenance. However, if you share any new or concerning symptoms--such as dizziness, passing out, severe pain, or anything else that may point to a more serious issue--we are both legally and ethically required to evaluate it. We cannot simply overlook or ignore such concerns, even if you later decide you don't want to discuss them, because it could jeopardize your health.  If addressing a new concern takes us  beyond the scope of the preventive visit, we may need to bill separately for that portion of care. We understand financial considerations are important, and we're happy to discuss your options if something new comes up. However, we want to be clear that once you mention a potentially serious issue, we must investigate it; we can't ethically or legally exclude that from our records or our evaluation. Please let us  know all of your questions or worries. Together, we can decide how best to manage them and how to minimize any unexpected costs, but we want to keep you safe above all else."   This disclosure is mandated by professional ethics and legal obligations, as healthcare providers must address any substantial health concerns raised during  any patient interaction and a comprehensive ROS is required by insurance companies for billing preventive-care visit type.   This disclosure ultimately discourages patients financially from reporting significant health issues.

## 2023-11-12 NOTE — Assessment & Plan Note (Signed)
 Secondary to stroke and CVA

## 2023-11-12 NOTE — Assessment & Plan Note (Signed)
 Discussed the potential use of Ozempic, which may reduce stroke risk by 25%. He is considering the medication and will conduct further research. Insurance coverage and FDA approval were discussed as factors in decision-making. Consider starting Ozempic pending his decision and insurance approval. Provide information on Ozempic for further consideration.

## 2023-11-13 ENCOUNTER — Ambulatory Visit: Payer: Self-pay | Admitting: Internal Medicine

## 2023-11-13 NOTE — Progress Notes (Signed)
 All the labs were normal so it is hard for me to imagine with this good of cholesterol why he had stroke at such a young age.  Given the unusual nature of it I recommend be even more aggressive with cholesterol.  We have referred to neurology.  I would like their opinion on blood thinners as well it appears were not taking any aspirin to prevent strokes.  Also he probably does not want me beating this dead horse but Ozempic was proven to reduce stroke risk and it could replace metformin  which does not.  There are special rehabilitation doctors called physical medicine and rehabilitation doctors which I think might be helpful to reduce progression of his contracture on his left hand.  Let him know I would like to help in any way he wants if he wants an appointment we will do that if he wants medicine or referrals we will do that

## 2023-11-27 ENCOUNTER — Encounter: Payer: Self-pay | Admitting: Internal Medicine

## 2023-12-06 ENCOUNTER — Other Ambulatory Visit: Payer: Self-pay | Admitting: Internal Medicine

## 2023-12-06 DIAGNOSIS — I10 Essential (primary) hypertension: Secondary | ICD-10-CM

## 2023-12-30 ENCOUNTER — Ambulatory Visit: Admitting: Physical Medicine & Rehabilitation

## 2024-01-01 ENCOUNTER — Encounter: Payer: Self-pay | Admitting: Physical Medicine & Rehabilitation

## 2024-01-01 ENCOUNTER — Encounter: Attending: Physical Medicine & Rehabilitation | Admitting: Physical Medicine & Rehabilitation

## 2024-01-01 VITALS — BP 122/84 | HR 84 | Ht 67.0 in | Wt 163.2 lb

## 2024-01-01 DIAGNOSIS — G8114 Spastic hemiplegia affecting left nondominant side: Secondary | ICD-10-CM | POA: Insufficient documentation

## 2024-01-01 MED ORDER — SODIUM CHLORIDE (PF) 0.9 % IJ SOLN
7.5000 mL | Freq: Once | INTRAMUSCULAR | Status: AC
  Administered 2024-01-01: 7.5 mL via INTRAVENOUS

## 2024-01-01 MED ORDER — ABOBOTULINUMTOXINA 500 UNITS IM SOLR
1500.0000 [IU] | INTRAMUSCULAR | Status: AC
  Administered 2024-01-01: 1500 [IU] via INTRAMUSCULAR

## 2024-01-01 NOTE — Progress Notes (Signed)
 Dysport  Injection for spasticity using needle EMG guidance  Dilution: 200 Units/ml Indication: Severe spasticity which interferes with ADL,mobility and/or  hygiene and is unresponsive to medication management and other conservative care Informed consent was obtained after describing risks and benefits of the procedure with the patient. This includes bleeding, bruising, infection, excessive weakness, or medication side effects. A REMS form is on file and signed. Needle:  needle electrode Number of units per muscle LEFT FCR 300 FDS 300 FDP 300 Biceps 400 OP 100 FCU 100 All injections were done after obtaining appropriate EMG activity and after negative drawback for blood. The patient tolerated the procedure well. Post procedure instructions were given. A followup appointment was made.

## 2024-01-01 NOTE — Patient Instructions (Signed)
You received a Dysport injection today. You may experience muscle pains and aches. He may apply ice 20 minutes every 2 hours as needed for the next 24-48 hours. He also noticed bleeding or bruising in the areas that were injected. May apply Band-Aid. If this bruising is extensive, please notify our office. If there is evidence of increasing redness that occurs 2-3 days after injection. Please call our office. This could be a sign of infection. It is very rare, however. You may experience some muscle weakness in the muscles and injected. This would likely start in about one week.  

## 2024-02-17 ENCOUNTER — Encounter: Attending: Physical Medicine & Rehabilitation | Admitting: Physical Medicine & Rehabilitation

## 2024-02-17 ENCOUNTER — Encounter: Payer: Self-pay | Admitting: Physical Medicine & Rehabilitation

## 2024-02-17 ENCOUNTER — Telehealth: Payer: Self-pay | Admitting: Physical Medicine & Rehabilitation

## 2024-02-17 VITALS — BP 124/86 | HR 79 | Ht 67.0 in | Wt 164.4 lb

## 2024-02-17 DIAGNOSIS — G8114 Spastic hemiplegia affecting left nondominant side: Secondary | ICD-10-CM | POA: Insufficient documentation

## 2024-02-17 NOTE — Progress Notes (Signed)
 Subjective:    Patient ID: Dennis Mercado, male    DOB: 1981-05-09, 43 y.o.   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  He has regained functional independence with all mobility and self-care.  He has done some traveling including a recent cruise with his wife.  He works full-time from home in a sedentary position.  He drives independently  He has been getting botulinum toxin injections every 3 months.  He has had primarily issues with finger wrist and elbow flexors spasticity in the left upper extremity. He feels the last injection was helpful however still has significant wrist and finger flexor spasticity. 7/17 LEFT FCR 300 FDS 300 FDP 300 Biceps 400 OP 100 FCU 100  4/15 LEFT FCR 300  FDS 100 FDP 200 Biceps 500 BR 200 FPL 100 FCU 100 Pain Inventory Average Pain 0 Pain Right Now 0 My pain is no pain  In the last 24 hours, has pain interfered with the following? General activity 0 Relation with others 0 Enjoyment of life 0 What TIME of day is your pain at its worst? no pain Sleep (in general) Fair  Pain is worse with: no pain Pain improves with: no pain Relief from Meds: no pain  Family History  Problem Relation Age of Onset   Diabetes Maternal Grandmother    Social History   Socioeconomic History   Marital status: Married    Spouse name: Not on file   Number of children: Not on file   Years of education: Not on file   Highest education level: Associate degree: occupational, Scientist, product/process development, or vocational program  Occupational History   Not on file  Tobacco Use   Smoking status: Never   Smokeless tobacco: Never  Vaping Use   Vaping status: Never Used  Substance and Sexual Activity   Alcohol use: Yes    Comment: several drinks over the weekend   Drug use: Not Currently    Types: Marijuana   Sexual activity: Yes    Birth  control/protection: Condom  Other Topics Concern   Not on file  Social History Narrative   Not on file   Social Drivers of Health   Financial Resource Strain: Low Risk  (07/04/2023)   Overall Financial Resource Strain (CARDIA)    Difficulty of Paying Living Expenses: Not hard at all  Food Insecurity: No Food Insecurity (07/04/2023)   Hunger Vital Sign    Worried About Running Out of Food in the Last Year: Never true    Ran Out of Food in the Last Year: Never true  Transportation Needs: No Transportation Needs (07/04/2023)   PRAPARE - Administrator, Civil Service (Medical): No    Lack of Transportation (Non-Medical): No  Physical Activity: Sufficiently Active (07/04/2023)   Exercise Vital Sign    Days of Exercise per Week: 3 days    Minutes of Exercise per Session: 60 min  Stress: No Stress Concern Present (07/04/2023)   [employer] of Occupational Health - Occupational Stress Questionnaire    Feeling of Stress : Not at all  Social Connections: Moderately Integrated (07/04/2023)   Social Connection and Isolation Panel    Frequency of Communication with Friends and Family: Three times a week    Frequency of Social Gatherings with Friends and Family: Once a week    Attends Religious Services: More than 4 times per year  Active Member of Clubs or Organizations: No    Attends Engineer, structural: Not on file    Marital Status: Married   Past Surgical History:  Procedure Laterality Date   WISDOM TOOTH EXTRACTION     Past Surgical History:  Procedure Laterality Date   WISDOM TOOTH EXTRACTION     Past Medical History:  Diagnosis Date   Acute blood loss anemia    Acute respiratory failure (HCC)    Dysphagia, post-stroke    Grieving 12/20/2021   Hypertension    Hyponatremia    Lab Results      Component    Value    Date/Time           NA    138    07/08/2023 09:15 AM           NA    140    01/21/2023 09:31 AM           NA    139    07/09/2022 09:56 AM            NA    136    12/20/2021 11:20 AM           NA    137    06/27/2021 09:06 AM           NA    138    01/31/2021 11:00 AM           NA    140    10/31/2020 02:31 PM           Hypotension due to drugs    ICH (intracerebral hemorrhage) (HCC) 12/06/2019   Leukocytosis    Lab Results      Component    Value    Date/Time           WBC    5.9    07/09/2022 09:56 AM           WBC    7.2    06/27/2021 09:06 AM           WBC    6.6    10/05/2020 09:54 AM           WBC    7.9    03/28/2020 08:01 AM           WBC    6.8    01/10/2020 06:10 AM           Marijuana abuse    Normocytic anemia    Lab Results      Component    Value    Date/Time           HGB    14.3    07/09/2022 09:56 AM           HGB    13.5    06/27/2021 09:06 AM           HGB    13.2    10/05/2020 09:54 AM           HGB    13.0    03/28/2020 08:01 AM           HGB    12.1 (L)    01/10/2020 06:10 AM           Prediabetes 06/27/2021   Prerenal azotemia    Lab Results      Component    Value    Date           BUN  14    07/08/2023           BUN    12    01/21/2023           BUN    13    07/09/2022           BUN    11    12/20/2021           BUN    21    06/27/2021           Screening for prostate cancer 12/20/2021   Sleep disturbance    Transaminitis    Lab Results      Component    Value    Date           ALT    18    07/09/2022           AST    15    07/09/2022           ALKPHOS    52    06/27/2021           BILITOT    0.7    07/09/2022           Vascular headache    BP 124/86   Pulse 79   Ht 5' 7 (1.702 m)   Wt 164 lb 6.4 oz (74.6 kg)   SpO2 96%   BMI 25.75 kg/m   Opioid Risk Score:   Fall Risk Score:  `1  Depression screen [facility] 2/9     02/17/2024    2:02 PM 01/01/2024    9:55 AM 11/12/2023    8:35 AM 09/02/2023   10:03 AM 07/08/2023    8:44 AM 05/27/2023   12:39 PM 12/12/2022    8:52 AM  Depression screen PHQ 2/9  Decreased Interest 0 0 0 0 0 0 0  Down, Depressed, Hopeless 0 0 0 0 0 0 0  PHQ - 2 Score 0 0 0 0 0 0 0   Altered sleeping   0      Tired, decreased energy   0      Change in appetite   0      Feeling bad or failure about yourself    0      Trouble concentrating   0      Moving slowly or fidgety/restless   0      Suicidal thoughts   0      PHQ-9 Score   0      Difficult doing work/chores   Not difficult at all         Review of Systems  All other systems reviewed and are negative.      Objective:   Physical Exam General No acute distress Mood and affect appropriate Speech without dysarthria or aphasia No evidence of facial droop Left upper extremity 3 - at the deltoid, 3 - at the elbow flexor 0 at the elbow extensor 0 at the finger flexors and extensors 0 at the wrist flexors and extensors Tone MAS to the left elbow flexor MAS 4 at the wrist flexor and MAS 3 finger flexors mainly with DIP flexion       Assessment & Plan:  1.  Left spastic hemiplegia due to right thalamic hemorrhage.  We will add palmaris longus and reduced dose to elbow flexor as well as FDS,  On or after 04/02/2024 Dysport  LEFT FCR 300 FCU 100 Palm [place] 300 FDS 200 FDP 300  Biceps 200 OP 100

## 2024-02-17 NOTE — Telephone Encounter (Signed)
 P stated dysport  needs to be sent in to us  for refill

## 2024-03-07 ENCOUNTER — Other Ambulatory Visit: Payer: Self-pay | Admitting: Internal Medicine

## 2024-03-07 DIAGNOSIS — I10 Essential (primary) hypertension: Secondary | ICD-10-CM

## 2024-03-30 ENCOUNTER — Encounter: Payer: Self-pay | Admitting: Physical Medicine & Rehabilitation

## 2024-03-30 ENCOUNTER — Encounter: Payer: Self-pay | Admitting: Internal Medicine

## 2024-03-30 ENCOUNTER — Encounter: Attending: Physical Medicine & Rehabilitation | Admitting: Physical Medicine & Rehabilitation

## 2024-03-30 VITALS — BP 118/79 | HR 78 | Ht 67.0 in | Wt 159.0 lb

## 2024-03-30 DIAGNOSIS — G8114 Spastic hemiplegia affecting left nondominant side: Secondary | ICD-10-CM | POA: Diagnosis present

## 2024-03-30 MED ORDER — ABOBOTULINUMTOXINA 500 UNITS IM SOLR
1500.0000 [IU] | Freq: Once | INTRAMUSCULAR | Status: AC
  Administered 2024-03-30: 1500 [IU] via INTRAMUSCULAR

## 2024-03-30 MED ORDER — SODIUM CHLORIDE (PF) 0.9 % IJ SOLN
7.5000 mL | Freq: Once | INTRAMUSCULAR | Status: AC
  Administered 2024-03-30: 7.5 mL via INTRAVENOUS

## 2024-03-30 NOTE — Progress Notes (Signed)
 Dysport  Injection for spasticity using needle EMG guidance  Dilution: 200 Units/ml Indication: Severe spasticity which interferes with ADL,mobility and/or  hygiene and is unresponsive to medication management and other conservative care Informed consent was obtained after describing risks and benefits of the procedure with the patient. This includes bleeding, bruising, infection, excessive weakness, or medication side effects. A REMS form is on file and signed. Last injection 01/01/24>12wks ago Needle:  needle electrode Number of units per muscle LEFT FCR 300 FCU 100 Palm Long 300 FDS 200 FDP 300 Biceps 200 FPL 100 All injections were done after obtaining appropriate EMG activity and after negative drawback for blood. The patient tolerated the procedure well. Post procedure instructions were given. A followup appointment was made.

## 2024-04-02 NOTE — Telephone Encounter (Signed)
 Patient scheduled.

## 2024-04-27 ENCOUNTER — Ambulatory Visit: Admitting: Physical Medicine & Rehabilitation

## 2024-05-03 ENCOUNTER — Ambulatory Visit: Admitting: Internal Medicine

## 2024-05-03 ENCOUNTER — Encounter: Payer: Self-pay | Admitting: Internal Medicine

## 2024-05-03 VITALS — BP 118/74 | HR 74 | Temp 98.0°F | Ht 67.0 in | Wt 153.2 lb

## 2024-05-03 DIAGNOSIS — Z7984 Long term (current) use of oral hypoglycemic drugs: Secondary | ICD-10-CM | POA: Diagnosis not present

## 2024-05-03 DIAGNOSIS — E119 Type 2 diabetes mellitus without complications: Secondary | ICD-10-CM | POA: Diagnosis not present

## 2024-05-03 DIAGNOSIS — I10 Essential (primary) hypertension: Secondary | ICD-10-CM | POA: Diagnosis not present

## 2024-05-03 DIAGNOSIS — Z79899 Other long term (current) drug therapy: Secondary | ICD-10-CM | POA: Diagnosis not present

## 2024-05-03 LAB — CBC WITH DIFFERENTIAL/PLATELET
Basophils Absolute: 0 K/uL (ref 0.0–0.1)
Basophils Relative: 0.4 % (ref 0.0–3.0)
Eosinophils Absolute: 0 K/uL (ref 0.0–0.7)
Eosinophils Relative: 1 % (ref 0.0–5.0)
HCT: 42.8 % (ref 39.0–52.0)
Hemoglobin: 13.9 g/dL (ref 13.0–17.0)
Lymphocytes Relative: 19.3 % (ref 12.0–46.0)
Lymphs Abs: 0.8 K/uL (ref 0.7–4.0)
MCHC: 32.5 g/dL (ref 30.0–36.0)
MCV: 81 fl (ref 78.0–100.0)
Monocytes Absolute: 0.4 K/uL (ref 0.1–1.0)
Monocytes Relative: 9.2 % (ref 3.0–12.0)
Neutro Abs: 3 K/uL (ref 1.4–7.7)
Neutrophils Relative %: 70.1 % (ref 43.0–77.0)
Platelets: 234 K/uL (ref 150.0–400.0)
RBC: 5.29 Mil/uL (ref 4.22–5.81)
RDW: 15.4 % (ref 11.5–15.5)
WBC: 4.2 K/uL (ref 4.0–10.5)

## 2024-05-03 LAB — MICROALBUMIN / CREATININE URINE RATIO
Creatinine,U: 17 mg/dL
Microalb Creat Ratio: UNDETERMINED mg/g (ref 0.0–30.0)
Microalb, Ur: <0.7 mg/dL

## 2024-05-03 LAB — COMPREHENSIVE METABOLIC PANEL WITH GFR
ALT: 21 U/L (ref 0–53)
AST: 23 U/L (ref 0–37)
Albumin: 4.7 g/dL (ref 3.5–5.2)
Alkaline Phosphatase: 35 U/L — ABNORMAL LOW (ref 39–117)
BUN: 16 mg/dL (ref 6–23)
CO2: 27 meq/L (ref 19–32)
Calcium: 9.6 mg/dL (ref 8.4–10.5)
Chloride: 100 meq/L (ref 96–112)
Creatinine, Ser: 1.28 mg/dL (ref 0.40–1.50)
GFR: 68.67 mL/min (ref >60.00)
Glucose, Bld: 78 mg/dL (ref 70–99)
Potassium: 3.8 meq/L (ref 3.5–5.1)
Sodium: 138 meq/L (ref 135–145)
Total Bilirubin: 0.5 mg/dL (ref 0.2–1.2)
Total Protein: 7.3 g/dL (ref 6.0–8.3)

## 2024-05-03 LAB — LIPID PANEL
Cholesterol: 106 mg/dL (ref 0–200)
HDL: 44.1 mg/dL (ref >39.00)
LDL Cholesterol: 52 mg/dL (ref 0–99)
NonHDL: 61.78
Total CHOL/HDL Ratio: 2
Triglycerides: 49 mg/dL (ref 0.0–149.0)
VLDL: 9.8 mg/dL (ref 0.0–40.0)

## 2024-05-03 MED ORDER — AMLODIPINE BESYLATE 10 MG PO TABS: 10.0000 mg | ORAL_TABLET | Freq: Every day | ORAL | 0 refills | Status: DC

## 2024-05-03 MED ORDER — CARVEDILOL PHOSPHATE ER 40 MG PO CP24
40.0000 mg | ORAL_CAPSULE | Freq: Every day | ORAL | 4 refills | Status: AC
Start: 2024-05-03 — End: ?

## 2024-05-03 NOTE — Assessment & Plan Note (Signed)
 Hypertension is managed with carvedilol  and amlodipine . Currently, he takes carvedilol  25 mg twice daily, a high dose, but his home blood pressure readings are generally well-controlled with occasional low readings. No recent lightheadedness reported. To improve adherence and potentially reduce side effects, switch to carvedilol  CR 40 mg once daily. Monitor blood pressure closely after the switch. Amlodipine  is refilled but will be discontinued if blood pressure remains consistently under 120/80 mmHg. A urine test is ordered to check for kidney strain, along with labs for medication monitoring, including a metabolic panel, blood count, thyroid, and A1c.

## 2024-05-03 NOTE — Patient Instructions (Addendum)
 It was a pleasure seeing you today! Your health and satisfaction are our top priorities.  Dennis Cone, MD [email or web address] VISIT SUMMARY: Today, we discussed your current medications and overall health. We focused on managing your blood pressure and diabetes, and we reviewed your recent PSA screening results. We also talked about your diet and lifestyle choices.  YOUR PLAN: -ESSENTIAL HYPERTENSION: Essential hypertension is high blood pressure with no identifiable cause. Your blood pressure is currently managed with carvedilol  and amlodipine . We will switch you to carvedilol  CR 40 mg once daily to improve convenience and potentially reduce side effects. Please monitor your blood pressure closely after the switch. If your blood pressure remains consistently under 120/80 mmHg, we will discontinue amlodipine . A urine test and labs, including a metabolic panel, blood count, thyroid, and A1c, are ordered to monitor your health.  -TYPE 2 DIABETES MELLITUS: Type 2 diabetes mellitus is a condition where your body does not use insulin properly, leading to high blood sugar levels. Your diabetes is well-controlled with metformin  and dietary modifications. Continue taking metformin  as prescribed and maintain your low-carb diet to help with weight loss and glucose control. An A1c test is ordered to monitor your glucose levels.  -GENERAL HEALTH MAINTENANCE: Your PSA screening in June 2025 was normal, indicating no current concern for prostate cancer. Regular screenings and healthy lifestyle choices are important for maintaining overall health. Continue PSA screening every two years and maintain a healthy diet and exercise routine.  INSTRUCTIONS: Please monitor your blood pressure closely after switching to carvedilol  CR 40 mg once daily. If your blood pressure remains consistently under 120/80 mmHg, we will discontinue amlodipine . A urine test and labs, including a metabolic panel, blood count, thyroid, and A1c,  are ordered to monitor your health. Continue taking metformin  as prescribed and maintain your low-carb diet. Schedule your next PSA screening in June 2027.    Your Providers PCP: Mercado Dennis MATSU, MD,  [phone]) Referring Provider: Cone Dennis MATSU, MD,  [phone])  NEXT STEPS: [x]  Early Intervention: Schedule sooner appointment, call our on-call services, or go to emergency room if there is any significant Increase in pain or discomfort New or worsening symptoms Sudden or severe changes in your health [x]  Flexible Follow-Up: We recommend a Return in about 6 months (around 10/31/2024) for chronic disease monitoring and management. for optimal routine care. This allows for progress monitoring and treatment adjustments. [x]  Preventive Care: Schedule your annual preventive care visit! It's typically covered by insurance and helps identify potential health issues early. [x]  Lab & X-ray Appointments: Incomplete tests scheduled today, or call to schedule. X-rays: [facility] (M-F, 8:30am-noon or 1pm-5pm). [x]  Medical Information Release: Sign a release form at front desk to obtain relevant medical information we don't have.  MAKING THE MOST OF OUR FOCUSED 20 MINUTE APPOINTMENTS: [x]   Clearly state your top concerns at the beginning of the visit to focus our discussion [x]   If you anticipate you will need more time, please inform the front desk during scheduling - we can book multiple appointments in the same week. [x]   If you have transportation problems- use our convenient video appointments or ask about transportation support. [x]   We can get down to business faster if you use MyChart to update information before the visit and submit non-urgent questions before your visit. Thank you for taking the time to provide details through MyChart.  Let our nurse know and she can import this information into your encounter documents.  Arrival and Wait  Times: [x]   Arriving on time  ensures that everyone receives prompt attention. [x]   Early morning (8a) and afternoon (1p) appointments tend to have shortest wait times. [x]   Unfortunately, we cannot delay appointments for late arrivals or hold slots during phone calls.  Getting Answers and Following Up [x]   Simple Questions & Concerns: For quick questions or basic follow-up after your visit, reach us  at [phone] or MyChart messaging. [x]   Complex Concerns: If your concern is more complex, scheduling an appointment might be best. Discuss this with the staff to find the most suitable option. [x]   Lab & Imaging Results: We'll contact you directly if results are abnormal or you don't use MyChart. Most normal results will be on MyChart within 2-3 business days, with a review message from Dr. Jesus. Haven't heard back in 2 weeks? Need results sooner? Contact us  at [phone]. [x]   Referrals: Our referral coordinator will manage specialist referrals. The specialist's office should contact you within 2 weeks to schedule an appointment. Call us  if you haven't heard from them after 2 weeks.  Staying Connected [x]   MyChart: Activate your MyChart for the fastest way to access results and message us . See the last page of this paperwork for instructions on how to activate.  Bring to Your Next Appointment [x]   Medications: Please bring all your medication bottles to your next appointment to ensure we have an accurate record of your prescriptions. [x]   Health Diaries: If you're monitoring any health conditions at home, keeping a diary of your readings can be very helpful for discussions at your next appointment.  Billing [x]   X-ray & Lab Orders: These are billed by separate companies. Contact the invoicing company directly for questions or concerns. [x]   Visit Charges: Discuss any billing inquiries with our administrative services team.  Your Satisfaction Matters [x]   Share Your Experience: We strive for your satisfaction! If  you have any complaints, or preferably compliments, please let Dr. Jesus know directly or contact our Practice Administrators, Manuelita Rubin or [employer], by asking at the front desk.   Reviewing Your Records [x]   Review this early draft of your clinical encounter notes below and the final encounter summary tomorrow on MyChart after its been completed.  All orders placed so far are visible here: Essential hypertension -     Carvedilol  Phosphate ER; Take 1 capsule (40 mg total) by mouth daily.  Dispense: 90 capsule; Refill: 4 -     amLODIPine  Besylate; Take 1 tablet (10 mg total) by mouth daily. If blood pressure under 120/80 ok to stop  Dispense: 90 tablet; Refill: 0  Controlled type 2 diabetes mellitus without complication, without long-term current use of insulin (HCC) -     Lipid panel -     Comprehensive metabolic panel with GFR -     CBC with Differential/Platelet -     Hemoglobin A1c -     Microalbumin / creatinine urine ratio  Medication management -     Comprehensive metabolic panel with GFR -     CBC with Differential/Platelet -     TSH Rfx on Abnormal to Free T4  Benign essential HTN -     amLODIPine  Besylate; Take 1 tablet (10 mg total) by mouth daily. If blood pressure under 120/80 ok to stop  Dispense: 90 tablet; Refill: 0

## 2024-05-03 NOTE — Progress Notes (Signed)
 ==============================  [facility]: [phone]   -- Medical Office Visit --  Patient: Dennis Mercado      Age: 43 y.o.       Sex:  male  Date:   05/03/2024 Today's Healthcare Provider: Bernardino KANDICE Cone, MD  ==============================   Chief Complaint: Weight Check (States everything has been going good)  Discussed the use of AI scribe software for clinical note transcription with the patient, who gave verbal consent to proceed.  History of Present Illness 43 year old male who presents for medication management and follow-up.  He is currently taking carvedilol  25 mg twice daily since his stroke. His blood pressure drops significantly at night after taking the medication, with readings as low as 110/71 mmHg and occasionally 103/67 mmHg. He recalls a reading of 95/64 mmHg in March, but nothing lower recently. No lightheadedness. He inquires about switching to an extended-release formulation for convenience, as his wife takes a once-daily version.  He had a PSA screening done in June 2025 with [facility].  He has been on a low-carb diet and reports losing about ten pounds. His A1c was borderline diabetic once in 2024, but he has not had significant issues since. He is currently taking metformin  at night and is open to continuing it as it aids in weight management and glucose control.  He is on multiple medications, including carvedilol , Norvasc  (amlodipine ), Crestor , and metformin . He is interested in potentially reducing his medication load, particularly if his blood pressure remains stable. He has not consumed alcohol in months and is mindful of his diet, avoiding high-salt foods and incorporating healthy fats like extra virgin olive oil and fish.  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 Past Medical History:  has a past medical history of Acute blood loss anemia, Acute respiratory failure (HCC), Dysphagia, post-stroke, Grieving (12/20/2021), Hypertension, Hyponatremia, Hypotension due to drugs, ICH (intracerebral hemorrhage) (HCC) (12/06/2019), Leukocytosis, Marijuana abuse, Normocytic anemia, Prediabetes (06/27/2021), Prerenal azotemia, Screening for prostate cancer (12/20/2021), Sleep disturbance, Transaminitis, and Vascular headache. Past Surgical History:   has a past surgical history that includes Wisdom tooth extraction. Social History:   reports that he has never smoked. He has never used smokeless tobacco. He reports current alcohol use. He reports that he does not currently use drugs after having used the following drugs: Marijuana. Family History:  family history includes Diabetes in his maternal grandmother. Allergies:  has no known allergies.   Medication Reconciliation: Current Outpatient Medications on File Prior to Visit  Medication Sig   carvedilol  (COREG ) 25 MG tablet TAKE 1 TABLET(25 MG) BY MOUTH TWICE DAILY WITH A MEAL   DYSPORT  500 units SOLR injection INJECT LEFT BICEPS 500 UNITS, FPL 100 UNITS, FCR 300 UNITS, PALMARIS LONGUS 100 UNITS, FDS 200 UNITS, FDP 300 UNITS. AS DIRECTED TO BE ADMINISTERED IN PRESCRIBERS OFFICE EVERY 3 MONTHS AS DIRECTED   metFORMIN  (GLUCOPHAGE -XR) 500 MG 24 hr tablet TAKE 1 TABLET(500 MG) BY MOUTH DAILY WITH BREAKFAST   rosuvastatin  (CRESTOR ) 40 MG tablet Take 1 tablet (40 mg total) by mouth daily. Replaces atorvastatin    tadalafil (CIALIS) 5 MG tablet Take 5 mg by mouth daily as needed.   Current Facility-Administered Medications on File Prior to Visit  Medication   AbobotulinumtoxinA  (DYSPORT ) 500 units injection 1,500 Units   AbobotulinumtoxinA  (DYSPORT ) 500 units injection 1,500 Units  sodium chloride  (PF) 0.9 % injection 7.5 mL   Medications Discontinued During This Encounter   Medication Reason   amLODipine  (NORVASC ) 10 MG tablet Reorder     Physical Exam:    05/03/2024    7:50 AM 03/30/2024   10:39 AM 02/17/2024    1:57 PM  Vitals with BMI  Height 5' 7 5' 7 5' 7  Weight 153 lbs 3 oz 159 lbs 164 lbs 6 oz  BMI 23.99 24.9 25.74  Systolic 118 118 875  Diastolic 74 79 86  Pulse 74 78 79  Vital signs reviewed.  Nursing notes reviewed. Weight trend reviewed. Physical Activity: Insufficiently Active (04/29/2024)   Exercise Vital Sign    Days of Exercise per Week: 3 days    Minutes of Exercise per Session: 40 min   General Appearance:  No acute distress appreciable.   Well-groomed, healthy-appearing male.  Well proportioned with no abnormal fat distribution.  Good muscle tone. Pulmonary:  Normal work of breathing at rest, no respiratory distress apparent. SpO2: 98 %  Musculoskeletal: All extremities are intact.  Neurological:  Awake, alert, oriented, and engaged.  No obvious focal neurological deficits or cognitive impairments.  Sensorium seems unclouded.   Speech is clear and coherent with logical content. Psychiatric:  Appropriate mood, pleasant and cooperative demeanor, thoughtful and engaged during the exam  Results LABS PSA: Within normal limits (11/2023)     03/30/2024   10:46 AM 02/17/2024    2:02 PM 01/01/2024    9:55 AM 11/12/2023    8:35 AM  PHQ 2/9 Scores  PHQ - 2 Score 0 0 0 0  PHQ- 9 Score    0      Data saved with a previous flowsheet row definition    {   No results found for any visits on 05/03/24.} Office Visit on 11/12/2023  Component Date Value Ref Range Status   WBC 11/12/2023 5.8  4.0 - 10.5 K/uL Final   RBC 11/12/2023 5.38  4.22 - 5.81 Mil/uL Final   Hemoglobin 11/12/2023 14.2  13.0 - 17.0 g/dL Final   HCT 94/71/7974 44.2  39.0 - 52.0 % Final   MCV 11/12/2023 82.3  78.0 - 100.0 fl Final   MCHC 11/12/2023 32.1  30.0 - 36.0 g/dL Final   RDW 94/71/7974 14.6  11.5 - 15.5 % Final   Platelets 11/12/2023 231.0  150.0 - 400.0  K/uL Final   Neutrophils Relative % 11/12/2023 72.5  43.0 - 77.0 % Final   Lymphocytes Relative 11/12/2023 17.3  12.0 - 46.0 % Final   Monocytes Relative 11/12/2023 8.9  3.0 - 12.0 % Final   Eosinophils Relative 11/12/2023 0.8  0.0 - 5.0 % Final   Basophils Relative 11/12/2023 0.5  0.0 - 3.0 % Final   Neutro Abs 11/12/2023 4.2  1.4 - 7.7 K/uL Final   Lymphs Abs 11/12/2023 1.0  0.7 - 4.0 K/uL Final   Monocytes Absolute 11/12/2023 0.5  0.1 - 1.0 K/uL Final   Eosinophils Absolute 11/12/2023 0.0  0.0 - 0.7 K/uL Final   Basophils Absolute 11/12/2023 0.0  0.0 - 0.1 K/uL Final   Sodium 11/12/2023 138  135 - 145 mEq/L Final   Potassium 11/12/2023 4.3  3.5 - 5.1 mEq/L Final   Chloride 11/12/2023 102  96 - 112 mEq/L Final   CO2 11/12/2023 29  19 - 32 mEq/L Final   Glucose, Bld 11/12/2023 84  70 - 99 mg/dL Final   BUN 94/71/7974 14  6 - 23 mg/dL  Final   Creatinine, Ser 11/12/2023 1.21  0.40 - 1.50 mg/dL Final   Total Bilirubin 11/12/2023 0.6  0.2 - 1.2 mg/dL Final   Alkaline Phosphatase 11/12/2023 36 (L)  39 - 117 U/L Final   AST 11/12/2023 20  0 - 37 U/L Final   ALT 11/12/2023 21  0 - 53 U/L Final   Total Protein 11/12/2023 7.4  6.0 - 8.3 g/dL Final   Albumin 94/71/7974 4.8  3.5 - 5.2 g/dL Final   GFR 94/71/7974 73.71  >60.00 mL/min Final   Calcium  11/12/2023 9.7  8.4 - 10.5 mg/dL Final   Cholesterol 94/71/7974 110  0 - 200 mg/dL Final   Triglycerides 94/71/7974 51.0  0.0 - 149.0 mg/dL Final   HDL 94/71/7974 39.20  >39.00 mg/dL Final   VLDL 94/71/7974 10.2  0.0 - 40.0 mg/dL Final   LDL Cholesterol 11/12/2023 60  0 - 99 mg/dL Final   Total CHOL/HDL Ratio 11/12/2023 3   Final   NonHDL 11/12/2023 70.43   Final   Hgb A1c MFr Bld 11/12/2023 6.3  4.6 - 6.5 % Final   Microalb, Ur 11/12/2023 <0.7  mg/dL Final   Creatinine,U 94/71/7974 47.2  mg/dL Final   Microalb Creat Ratio 11/12/2023 Unable to calculate  0.0 - 30.0 mg/g Final   TSH 11/12/2023 1.94  0.35 - 5.50 uIU/mL Final  Office Visit on  07/08/2023  Component Date Value Ref Range Status   Sodium 07/08/2023 138  135 - 145 mEq/L Final   Potassium 07/08/2023 4.2  3.5 - 5.1 mEq/L Final   Chloride 07/08/2023 100  96 - 112 mEq/L Final   CO2 07/08/2023 31  19 - 32 mEq/L Final   Glucose, Bld 07/08/2023 87  70 - 99 mg/dL Final   BUN 98/78/7974 14  6 - 23 mg/dL Final   Creatinine, Ser 07/08/2023 1.16  0.40 - 1.50 mg/dL Final   GFR 98/78/7974 77.72  >60.00 mL/min Final   Calcium  07/08/2023 9.8  8.4 - 10.5 mg/dL Final   Cholesterol 98/78/7974 135  0 - 200 mg/dL Final   Triglycerides 98/78/7974 58.0  0.0 - 149.0 mg/dL Final   HDL 98/78/7974 41.10  >39.00 mg/dL Final   VLDL 98/78/7974 11.6  0.0 - 40.0 mg/dL Final   LDL Cholesterol 07/08/2023 82  0 - 99 mg/dL Final   Total CHOL/HDL Ratio 07/08/2023 3   Final   NonHDL 07/08/2023 93.42   Final   Hgb A1c MFr Bld 07/08/2023 6.3  4.6 - 6.5 % Final   Color, Urine 07/08/2023 YELLOW  Yellow;Lt. Yellow;Straw;Dark Yellow;Amber;Green;Red;Brown Final   APPearance 07/08/2023 CLEAR  Clear;Turbid;Slightly Cloudy;Cloudy Final   Specific Gravity, Urine 07/08/2023 1.010  1.000 - 1.030 Final   pH 07/08/2023 6.5  5.0 - 8.0 Final   Total Protein, Urine 07/08/2023 NEGATIVE  Negative Final   Urine Glucose 07/08/2023 NEGATIVE  Negative Final   Ketones, ur 07/08/2023 NEGATIVE  Negative Final   Bilirubin Urine 07/08/2023 NEGATIVE  Negative Final   Hgb urine dipstick 07/08/2023 NEGATIVE  Negative Final   Urobilinogen, UA 07/08/2023 0.2  0.0 - 1.0 Final   Leukocytes,Ua 07/08/2023 NEGATIVE  Negative Final   Nitrite 07/08/2023 NEGATIVE  Negative Final   WBC, UA 07/08/2023 none seen  0-2/hpf Final   RBC / HPF 07/08/2023 none seen  0-2/hpf Final  Office Visit on 01/21/2023  Component Date Value Ref Range Status   Sodium 01/21/2023 140  135 - 145 mEq/L Final   Potassium 01/21/2023 4.5  3.5 - 5.1 mEq/L  Final   Chloride 01/21/2023 101  96 - 112 mEq/L Final   CO2 01/21/2023 28  19 - 32 mEq/L Final   Glucose,  Bld 01/21/2023 85  70 - 99 mg/dL Final   BUN 91/93/7975 12  6 - 23 mg/dL Final   Creatinine, Ser 01/21/2023 1.14  0.40 - 1.50 mg/dL Final   GFR 91/93/7975 79.62  >60.00 mL/min Final   Calcium  01/21/2023 9.8  8.4 - 10.5 mg/dL Final   Direct LDL 91/93/7975 76.0  mg/dL Final   Hgb J8r MFr Bld 01/21/2023 6.0  4.6 - 6.5 % Final  Office Visit on 10/01/2022  Component Date Value Ref Range Status   Hemoglobin A1C 10/01/2022 6.7 (A)  4.0 - 5.6 % Final  Lab on 07/09/2022  Component Date Value Ref Range Status   Color, Urine 07/09/2022 CANCELED   Final   Creatinine, Urine 07/09/2022 118  20 - 320 mg/dL Final   Microalb, Ur 98/76/7975 0.7  mg/dL Final   Microalb Creat Ratio 07/09/2022 6  <30 mcg/mg creat Final   Glucose, Bld 07/09/2022 94  65 - 99 mg/dL Final   BUN 98/76/7975 13  7 - 25 mg/dL Final   Creat 98/76/7975 1.17  0.60 - 1.29 mg/dL Final   BUN/Creatinine Ratio 07/09/2022 SEE NOTE:  6 - 22 (calc) Final   Sodium 07/09/2022 139  135 - 146 mmol/L Final   Potassium 07/09/2022 4.3  3.5 - 5.3 mmol/L Final   Chloride 07/09/2022 102  98 - 110 mmol/L Final   CO2 07/09/2022 30  20 - 32 mmol/L Final   Calcium  07/09/2022 9.3  8.6 - 10.3 mg/dL Final   Total Protein 98/76/7975 6.9  6.1 - 8.1 g/dL Final   Albumin 98/76/7975 4.5  3.6 - 5.1 g/dL Final   Globulin 98/76/7975 2.4  1.9 - 3.7 g/dL (calc) Final   AG Ratio 07/09/2022 1.9  1.0 - 2.5 (calc) Final   Total Bilirubin 07/09/2022 0.7  0.2 - 1.2 mg/dL Final   Alkaline phosphatase (APISO) 07/09/2022 44  36 - 130 U/L Final   AST 07/09/2022 15  10 - 40 U/L Final   ALT 07/09/2022 18  9 - 46 U/L Final   WBC 07/09/2022 5.9  3.8 - 10.8 Thousand/uL Final   RBC 07/09/2022 5.34  4.20 - 5.80 Million/uL Final   Hemoglobin 07/09/2022 14.3  13.2 - 17.1 g/dL Final   HCT 98/76/7975 42.9  38.5 - 50.0 % Final   MCV 07/09/2022 80.3  80.0 - 100.0 fL Final   MCH 07/09/2022 26.8 (L)  27.0 - 33.0 pg Final   MCHC 07/09/2022 33.3  32.0 - 36.0 g/dL Final   RDW 98/76/7975  13.2  11.0 - 15.0 % Final   Platelets 07/09/2022 280  140 - 400 Thousand/uL Final   MPV 07/09/2022 10.0  7.5 - 12.5 fL Final   Hgb A1c MFr Bld 07/09/2022 6.1 (H)  <5.7 % of total Hgb Final   Mean Plasma Glucose 07/09/2022 128  mg/dL Final   eAG (mmol/L) 98/76/7975 7.1  mmol/L Final   Cholesterol 07/09/2022 110  <200 mg/dL Final   HDL 98/76/7975 39 (L)  > OR = 40 mg/dL Final   Triglycerides 98/76/7975 58  <150 mg/dL Final   LDL Cholesterol (Calc) 07/09/2022 58  mg/dL (calc) Final   Total CHOL/HDL Ratio 07/09/2022 2.8  <4.9 (calc) Final   Non-HDL Cholesterol (Calc) 07/09/2022 71  <130 mg/dL (calc) Final  No image results found. No results found.  ASSESSMENT & PLAN   Assessment & Plan Essential hypertension Medication management Hypertension is managed with carvedilol  and amlodipine . Currently, he takes carvedilol  25 mg twice daily, a high dose, but his home blood pressure readings are generally well-controlled with occasional low readings. No recent lightheadedness reported. To improve adherence and potentially reduce side effects, switch to carvedilol  CR 40 mg once daily. Monitor blood pressure closely after the switch. Amlodipine  is refilled but will be discontinued if blood pressure remains consistently under 120/80 mmHg. A urine test is ordered to check for kidney strain, along with labs for medication monitoring, including a metabolic panel, blood count, thyroid, and A1c. Controlled type 2 diabetes mellitus without complication, without long-term current use of insulin (HCC) Type 2 diabetes is well-controlled with metformin  and dietary modifications. His A1c, previously borderline, is now well-managed. He has lost weight and adheres to a low-carb diet. Continue metformin  as prescribed and dietary modifications to maintain weight loss and glucose control. An A1c test is ordered to monitor glucose control.  ORDER ASSOCIATIONS  #   DIAGNOSIS / CONDITION ICD-10 ENCOUNTER ORDER      ICD-10-CM   1. Essential hypertension  I10 carvedilol  (COREG  CR) 40 MG 24 hr capsule    amLODipine  (NORVASC ) 10 MG tablet    2. Controlled type 2 diabetes mellitus without complication, without long-term current use of insulin (HCC)  E11.9 Lipid panel    Comprehensive metabolic panel with GFR    CBC with Differential/Platelet    HgB A1c    Microalbumin / creatinine urine ratio    3. Medication management  Z79.899 Comprehensive metabolic panel with GFR    CBC with Differential/Platelet    TSH Rfx on Abnormal to Free T4    4. Benign essential HTN  I10 amLODipine  (NORVASC ) 10 MG tablet         Orders Placed in Encounter:   Lab Orders         Lipid panel         Comprehensive metabolic panel with GFR         CBC with Differential/Platelet         TSH Rfx on Abnormal to Free T4         HgB A1c         Microalbumin / creatinine urine ratio       This document was synthesized by artificial intelligence ([employer]bridge) using HIPAA-compliant recording of the clinical interaction;   We discussed the use of AI scribe software for clinical note transcription with the patient, who gave verbal consent to proceed. additional Info: This encounter employed state-of-the-art, real-time, collaborative documentation. The patient actively reviewed and assisted in updating their electronic medical record on a shared screen, ensuring transparency and facilitating joint problem-solving for the problem list, overview, and plan. This approach promotes accurate, informed care. The treatment plan was discussed and reviewed in detail, including medication safety, potential side effects, and all patient questions. We confirmed understanding and comfort with the plan. Follow-up instructions were established, including contacting the office for any concerns, returning if symptoms worsen, persist, or new symptoms develop, and precautions for potential emergency department visits.

## 2024-05-04 LAB — TSH RFX ON ABNORMAL TO FREE T4: TSH: 2.01 u[IU]/mL (ref 0.450–4.500)

## 2024-05-04 LAB — HEMOGLOBIN A1C: Hgb A1c MFr Bld: 6.2 % (ref 4.6–6.5)

## 2024-05-06 ENCOUNTER — Ambulatory Visit: Payer: Self-pay | Admitting: Internal Medicine

## 2024-05-07 ENCOUNTER — Encounter: Payer: Self-pay | Admitting: Internal Medicine

## 2024-07-06 ENCOUNTER — Encounter: Attending: Physical Medicine & Rehabilitation | Admitting: Physical Medicine & Rehabilitation

## 2024-07-06 ENCOUNTER — Encounter: Payer: Self-pay | Admitting: Physical Medicine & Rehabilitation

## 2024-07-06 VITALS — BP 127/87 | HR 87 | Ht 67.0 in | Wt 144.8 lb

## 2024-07-06 DIAGNOSIS — G8114 Spastic hemiplegia affecting left nondominant side: Secondary | ICD-10-CM | POA: Diagnosis not present

## 2024-07-06 MED ORDER — SODIUM CHLORIDE (PF) 0.9 % IJ SOLN
7.5000 mL | Freq: Once | INTRAMUSCULAR | Status: AC
  Administered 2024-07-06: 7.5 mL

## 2024-07-06 MED ORDER — ABOBOTULINUMTOXINA 500 UNITS IM SOLR
1500.0000 [IU] | Freq: Once | INTRAMUSCULAR | Status: AC
  Administered 2024-07-06: 1500 [IU] via INTRAMUSCULAR

## 2024-07-06 NOTE — Patient Instructions (Signed)
You received a Dysport injection today. You may experience muscle pains and aches. He may apply ice 20 minutes every 2 hours as needed for the next 24-48 hours. He also noticed bleeding or bruising in the areas that were injected. May apply Band-Aid. If this bruising is extensive, please notify our office. If there is evidence of increasing redness that occurs 2-3 days after injection. Please call our office. This could be a sign of infection. It is very rare, however. You may experience some muscle weakness in the muscles and injected. This would likely start in about one week.  

## 2024-07-06 NOTE — Progress Notes (Signed)
 Dysport  Injection for spasticity using needle EMG guidance  Dilution: 200 Units/ml Indication: Severe spasticity which interferes with ADL,mobility and/or  hygiene and is unresponsive to medication management and other conservative care Informed consent was obtained after describing risks and benefits of the procedure with the patient. This includes bleeding, bruising, infection, excessive weakness, or medication side effects. A REMS form is on file and signed. Last injection 03/30/24>12wks ago Needle:  needle electrode Number of units per muscle LEFT FCR 300 FCU 100 Palm Long 300 FDS 200 FDP 300 Biceps 200 FPL 100 All injections were done after obtaining appropriate EMG activity and after negative drawback for blood. The patient tolerated the procedure well. Post procedure instructions were given. A followup appointment was made.

## 2024-07-14 ENCOUNTER — Encounter: Payer: Self-pay | Admitting: Internal Medicine

## 2024-07-14 NOTE — Telephone Encounter (Signed)
 Please see pt msg as update and FYI on HTN and recent BP readings since stopping Amlodipine .

## 2024-07-22 ENCOUNTER — Telehealth: Payer: Self-pay

## 2024-07-22 NOTE — Telephone Encounter (Signed)
 Will keep an eye out for the paperwork.   Copied from CRM [id number]. Topic: General - Other >> Jul 22, 2024  9:14 AM Vena HERO wrote: Reason for CRM: Pt will be dropping off paperwork  for a Biometric Scan for insurance to be completed, due by 04/30. Last CPE 11/12/2023, last labs 05/03/24. Please keep an eye out and contact pt if needed for more info

## 2024-07-23 ENCOUNTER — Telehealth: Payer: Self-pay | Admitting: Internal Medicine

## 2024-07-23 NOTE — Telephone Encounter (Signed)
 Type of form received: Physician Results form  Additional comments: Pt Wife dropped off forms  Received by: Mabel Margarita  Form should be Faxed to: N/A  Form should be mailed to:  N/A  Is patient requesting call for pickup: No   Form placed:  Provider box  Attach charge sheet. Yes  Individual made aware of 3-5 business day turn around (Y/N)? Yes

## 2024-10-12 ENCOUNTER — Encounter: Attending: Physical Medicine & Rehabilitation | Admitting: Physical Medicine & Rehabilitation

## 2024-11-01 ENCOUNTER — Ambulatory Visit: Admitting: Internal Medicine

## 2024-11-23 ENCOUNTER — Encounter: Admitting: Internal Medicine
